# Patient Record
Sex: Male | Born: 1950 | Race: White | Hispanic: No | Marital: Married | Smoking: Former smoker
Health system: Southern US, Community
[De-identification: ages and names within clinical notes are randomized; demographics above are authoritative.]

## PROBLEM LIST (undated history)

## (undated) DIAGNOSIS — Z7289 Other problems related to lifestyle: Secondary | ICD-10-CM

## (undated) DIAGNOSIS — I739 Peripheral vascular disease, unspecified: Secondary | ICD-10-CM

## (undated) DIAGNOSIS — I2699 Other pulmonary embolism without acute cor pulmonale: Secondary | ICD-10-CM

## (undated) DIAGNOSIS — Z87891 Personal history of nicotine dependence: Secondary | ICD-10-CM

## (undated) DIAGNOSIS — E119 Type 2 diabetes mellitus without complications: Secondary | ICD-10-CM

## (undated) DIAGNOSIS — F1090 Alcohol use, unspecified, uncomplicated: Secondary | ICD-10-CM

## (undated) DIAGNOSIS — I10 Essential (primary) hypertension: Secondary | ICD-10-CM

## (undated) DIAGNOSIS — Z789 Other specified health status: Secondary | ICD-10-CM

## (undated) HISTORY — PX: VASCULAR SURGERY: SHX849

## (undated) HISTORY — PX: BACK SURGERY: SHX140

## (undated) HISTORY — PX: TONSILLECTOMY: SUR1361

## (undated) HISTORY — PX: CHOLECYSTECTOMY: SHX55

---

## 2004-08-04 ENCOUNTER — Ambulatory Visit: Payer: Self-pay | Admitting: Anesthesiology

## 2004-12-23 ENCOUNTER — Ambulatory Visit: Payer: Self-pay | Admitting: Anesthesiology

## 2005-05-19 ENCOUNTER — Ambulatory Visit: Payer: Self-pay | Admitting: Anesthesiology

## 2005-09-20 ENCOUNTER — Ambulatory Visit: Payer: Self-pay | Admitting: Anesthesiology

## 2005-12-13 ENCOUNTER — Ambulatory Visit: Payer: Self-pay | Admitting: Anesthesiology

## 2006-03-15 ENCOUNTER — Ambulatory Visit: Payer: Self-pay | Admitting: Anesthesiology

## 2006-06-06 ENCOUNTER — Ambulatory Visit: Payer: Self-pay | Admitting: Anesthesiology

## 2006-11-09 ENCOUNTER — Ambulatory Visit: Payer: Self-pay | Admitting: Anesthesiology

## 2006-12-27 ENCOUNTER — Ambulatory Visit: Payer: Self-pay | Admitting: Anesthesiology

## 2007-03-15 ENCOUNTER — Ambulatory Visit: Payer: Self-pay | Admitting: Anesthesiology

## 2007-12-05 ENCOUNTER — Ambulatory Visit: Payer: Self-pay | Admitting: Anesthesiology

## 2008-06-13 ENCOUNTER — Ambulatory Visit: Payer: Self-pay | Admitting: Anesthesiology

## 2010-10-17 DIAGNOSIS — I2699 Other pulmonary embolism without acute cor pulmonale: Secondary | ICD-10-CM

## 2010-10-17 HISTORY — DX: Other pulmonary embolism without acute cor pulmonale: I26.99

## 2012-12-13 ENCOUNTER — Ambulatory Visit: Payer: Self-pay | Admitting: Anesthesiology

## 2013-01-02 ENCOUNTER — Ambulatory Visit: Payer: Self-pay | Admitting: Anesthesiology

## 2013-02-15 DIAGNOSIS — I10 Essential (primary) hypertension: Secondary | ICD-10-CM | POA: Insufficient documentation

## 2013-02-15 DIAGNOSIS — K219 Gastro-esophageal reflux disease without esophagitis: Secondary | ICD-10-CM | POA: Insufficient documentation

## 2013-07-11 DIAGNOSIS — K589 Irritable bowel syndrome without diarrhea: Secondary | ICD-10-CM | POA: Insufficient documentation

## 2013-10-17 HISTORY — PX: ERCP W/ SPHICTEROTOMY: SHX1523

## 2013-11-08 ENCOUNTER — Emergency Department: Payer: Self-pay | Admitting: Emergency Medicine

## 2013-11-08 LAB — BASIC METABOLIC PANEL
ANION GAP: 7 (ref 7–16)
BUN: 16 mg/dL (ref 7–18)
CO2: 27 mmol/L (ref 21–32)
CREATININE: 1.07 mg/dL (ref 0.60–1.30)
Calcium, Total: 9.3 mg/dL (ref 8.5–10.1)
Chloride: 102 mmol/L (ref 98–107)
EGFR (Non-African Amer.): >60
Glucose: 150 mg/dL — ABNORMAL HIGH (ref 65–99)
Osmolality: 276 (ref 275–301)
Potassium: 3.4 mmol/L — ABNORMAL LOW (ref 3.5–5.1)
Sodium: 136 mmol/L (ref 136–145)

## 2013-11-08 LAB — CBC
HCT: 46.4 % (ref 40.0–52.0)
HGB: 16.1 g/dL (ref 13.0–18.0)
MCH: 36.1 pg — AB (ref 26.0–34.0)
MCHC: 34.8 g/dL (ref 32.0–36.0)
MCV: 104 fL — ABNORMAL HIGH (ref 80–100)
Platelet: 173 10*3/uL (ref 150–440)
RBC: 4.47 10*6/uL (ref 4.40–5.90)
RDW: 12.3 % (ref 11.5–14.5)
WBC: 10.2 10*3/uL (ref 3.8–10.6)

## 2014-07-26 DIAGNOSIS — Z9049 Acquired absence of other specified parts of digestive tract: Secondary | ICD-10-CM | POA: Insufficient documentation

## 2014-10-29 DIAGNOSIS — M65331 Trigger finger, right middle finger: Secondary | ICD-10-CM | POA: Insufficient documentation

## 2015-02-06 NOTE — H&P (Signed)
PATIENT NAME:  Damon English, Damon English MR#:  [id number] DATE OF BIRTH:  [birth year]  DATE OF ADMISSION:  12/13/2012  CHIEF COMPLAINT: Low back pain, left posterior lateral leg pain and chronic numbness affecting the left foot.   PROCEDURE:  1.  L4-L5 epidural steroid under fluoroscopic guidance moderate sedation.  2.  Trigger point at L5 x 2 approximately 5 cm to the left of midline.   HISTORY OF PRESENT ILLNESS:  The patient is a pleasant 64 year old white male with a long-standing history of low back pain. He was last seen here in the pain control center back in 2008 and presents 6 years later following a series of epidural steroids. He states that he did quite well with the epidural steroids in the past and has had a recurrence of the same quality pain that he had at that time. He has chronic lower extremity numbness that has been present for many years and has recently been involved moving some furniture. He was trying to move a bed across the floor with minimal assistance and felt a jerking sensation spasming in the low back with some recurrence of his low back pain. The pain he is describing is consistent with what he previously experienced without change in bowel or bladder function at this time. Otherwise, he is in his usual state of health at this time.   REVIEW OF SYSTEMS:  Have been noted per nursing assessment sheet.   MEDICATIONS:  Have been noted per nursing assessment sheet.  PHYSICAL EXAMINATION:  HEART:  Physical reveals heart to be regular rate and rhythm.  EYES:  Pupils are equally round and reactive to light. Extraocular muscles are intact.  LUNGS: Clear to auscultation.  BACK:  Inspection of the low back reveals 2 trigger points approximally overlying the posterior/superior iliac crest approximately 5 cm to the left of midline. He also has a positive straight leg raise on the left side. This is about 30 degrees.  His motor strength appears to be well preserved as his muscle tone and  bulk.   ASSESSMENT: 1.  Degenerative disk disease with spinal stenosis and left side radicular pain in the L5 distribution.  2.  Lumbar facet arthropathy.  3.  Myofascial low back pain.   PLAN: 1.  Will proceed with a repeat epidural series today. Have gone over the risks, benefits of the procedure with him in full detail. All questions are answered. No guarantees are made.  2.  We will proceed with 2 trigger point injections to the trigger points noted on examination today.  3.  Continue with exercises for back stretching and strengthening with return to clinic in approximately 3 weeks.   PROCEDURE: The patient was taken to the fluoroscopy suite and placed in the prone position with an IV in place and 4 mg of Versed were titrated for moderate sedation. His back was prepped with Betadine x 3. We identified the area overlying the L5-S1 interspace. 1% lidocaine was infiltrated subcutaneous into the fascia at this level. I then advanced an 18-gauge Tuohy needle with an intralaminar approach to a depth of approximately 7 cm with assistance of both AP and lateral FluoroScan. I was unable to achieve loss-of-resistance at this level. I then went to an L4-L5 site, then to a left paramedian and once again was unable to achieve loss-of-resistance  secondary to osseous intervention at all these levels. Ultimately at what appeared to be the L5-S1 level with a left paramedian approach I was able to achieve loss-of-resistance to  nearly 9 cm.  There was negative aspiration for heme or CSF, no paresthesia.  I then injected 6 mL of normal saline mixed with 1 mL ropivacaine 0.2% and 40 mg of triamcinolone and this was well tolerated. I then injected the 2 trigger points. With 4 mL of ropivacaine 0.2% and 5 mg of Decadron at each site with a 25-gauge needle. This was well tolerated. He was convalesced and discharged to home in stable condition for follow-up as mentioned.    ____________________________ Alvina Filbert Andree Elk,  MD jga:ct D: 12/14/2012 07:15:00 ET T: 12/14/2012 07:33:20 ET JOB#: [id number]  cc: Alvina Filbert. Andree Elk, MD, <Dictator> Alvina Filbert Hydie Langan MD ELECTRONICALLY SIGNED 12/18/2012 9:41

## 2015-06-12 DIAGNOSIS — J381 Polyp of vocal cord and larynx: Secondary | ICD-10-CM | POA: Insufficient documentation

## 2016-01-26 DIAGNOSIS — M545 Low back pain: Secondary | ICD-10-CM | POA: Diagnosis not present

## 2016-01-26 DIAGNOSIS — L219 Seborrheic dermatitis, unspecified: Secondary | ICD-10-CM | POA: Diagnosis not present

## 2016-01-26 DIAGNOSIS — E1165 Type 2 diabetes mellitus with hyperglycemia: Secondary | ICD-10-CM | POA: Diagnosis not present

## 2016-01-26 DIAGNOSIS — Z1389 Encounter for screening for other disorder: Secondary | ICD-10-CM | POA: Diagnosis not present

## 2016-03-18 DIAGNOSIS — M1812 Unilateral primary osteoarthritis of first carpometacarpal joint, left hand: Secondary | ICD-10-CM | POA: Diagnosis not present

## 2016-03-18 DIAGNOSIS — M1811 Unilateral primary osteoarthritis of first carpometacarpal joint, right hand: Secondary | ICD-10-CM | POA: Diagnosis not present

## 2016-03-29 DIAGNOSIS — Z6834 Body mass index (BMI) 34.0-34.9, adult: Secondary | ICD-10-CM | POA: Diagnosis not present

## 2016-03-29 DIAGNOSIS — J069 Acute upper respiratory infection, unspecified: Secondary | ICD-10-CM | POA: Diagnosis not present

## 2016-03-29 DIAGNOSIS — M545 Low back pain: Secondary | ICD-10-CM | POA: Diagnosis not present

## 2016-05-25 DIAGNOSIS — E119 Type 2 diabetes mellitus without complications: Secondary | ICD-10-CM | POA: Diagnosis not present

## 2016-05-25 DIAGNOSIS — M545 Low back pain: Secondary | ICD-10-CM | POA: Diagnosis not present

## 2016-05-25 DIAGNOSIS — Z6834 Body mass index (BMI) 34.0-34.9, adult: Secondary | ICD-10-CM | POA: Diagnosis not present

## 2016-05-25 DIAGNOSIS — I10 Essential (primary) hypertension: Secondary | ICD-10-CM | POA: Diagnosis not present

## 2016-10-19 DIAGNOSIS — R6889 Other general symptoms and signs: Secondary | ICD-10-CM | POA: Diagnosis not present

## 2016-10-19 DIAGNOSIS — Z6834 Body mass index (BMI) 34.0-34.9, adult: Secondary | ICD-10-CM | POA: Diagnosis not present

## 2016-11-01 DIAGNOSIS — Z23 Encounter for immunization: Secondary | ICD-10-CM | POA: Diagnosis not present

## 2016-12-01 DIAGNOSIS — E119 Type 2 diabetes mellitus without complications: Secondary | ICD-10-CM | POA: Diagnosis not present

## 2016-12-01 DIAGNOSIS — M545 Low back pain: Secondary | ICD-10-CM | POA: Diagnosis not present

## 2016-12-01 DIAGNOSIS — I10 Essential (primary) hypertension: Secondary | ICD-10-CM | POA: Diagnosis not present

## 2016-12-01 DIAGNOSIS — Z0000 Encounter for general adult medical examination without abnormal findings: Secondary | ICD-10-CM | POA: Diagnosis not present

## 2017-02-08 DIAGNOSIS — L308 Other specified dermatitis: Secondary | ICD-10-CM | POA: Diagnosis not present

## 2017-03-15 DIAGNOSIS — D2239 Melanocytic nevi of other parts of face: Secondary | ICD-10-CM | POA: Diagnosis not present

## 2017-03-15 DIAGNOSIS — D225 Melanocytic nevi of trunk: Secondary | ICD-10-CM | POA: Diagnosis not present

## 2017-03-15 DIAGNOSIS — L218 Other seborrheic dermatitis: Secondary | ICD-10-CM | POA: Diagnosis not present

## 2017-03-15 DIAGNOSIS — L308 Other specified dermatitis: Secondary | ICD-10-CM | POA: Diagnosis not present

## 2017-03-29 DIAGNOSIS — L308 Other specified dermatitis: Secondary | ICD-10-CM | POA: Diagnosis not present

## 2017-03-29 DIAGNOSIS — L218 Other seborrheic dermatitis: Secondary | ICD-10-CM | POA: Diagnosis not present

## 2017-03-29 DIAGNOSIS — L249 Irritant contact dermatitis, unspecified cause: Secondary | ICD-10-CM | POA: Diagnosis not present

## 2017-03-29 DIAGNOSIS — L570 Actinic keratosis: Secondary | ICD-10-CM | POA: Diagnosis not present

## 2017-11-08 DIAGNOSIS — Z23 Encounter for immunization: Secondary | ICD-10-CM | POA: Diagnosis not present

## 2017-11-08 DIAGNOSIS — K219 Gastro-esophageal reflux disease without esophagitis: Secondary | ICD-10-CM | POA: Diagnosis not present

## 2017-11-08 DIAGNOSIS — E119 Type 2 diabetes mellitus without complications: Secondary | ICD-10-CM | POA: Diagnosis not present

## 2017-11-08 DIAGNOSIS — I10 Essential (primary) hypertension: Secondary | ICD-10-CM | POA: Diagnosis not present

## 2017-11-08 DIAGNOSIS — Z1331 Encounter for screening for depression: Secondary | ICD-10-CM | POA: Diagnosis not present

## 2017-11-08 DIAGNOSIS — E782 Mixed hyperlipidemia: Secondary | ICD-10-CM | POA: Diagnosis not present

## 2018-01-10 DIAGNOSIS — M48061 Spinal stenosis, lumbar region without neurogenic claudication: Secondary | ICD-10-CM | POA: Diagnosis not present

## 2018-01-10 DIAGNOSIS — M545 Low back pain: Secondary | ICD-10-CM | POA: Diagnosis not present

## 2018-01-17 DIAGNOSIS — M5136 Other intervertebral disc degeneration, lumbar region: Secondary | ICD-10-CM | POA: Diagnosis not present

## 2018-01-24 DIAGNOSIS — M48061 Spinal stenosis, lumbar region without neurogenic claudication: Secondary | ICD-10-CM | POA: Diagnosis not present

## 2018-02-07 DIAGNOSIS — M545 Low back pain: Secondary | ICD-10-CM | POA: Diagnosis not present

## 2018-02-07 DIAGNOSIS — M48061 Spinal stenosis, lumbar region without neurogenic claudication: Secondary | ICD-10-CM | POA: Diagnosis not present

## 2018-02-07 DIAGNOSIS — G8929 Other chronic pain: Secondary | ICD-10-CM | POA: Diagnosis not present

## 2018-03-21 DIAGNOSIS — J069 Acute upper respiratory infection, unspecified: Secondary | ICD-10-CM | POA: Diagnosis not present

## 2018-03-21 DIAGNOSIS — Z6833 Body mass index (BMI) 33.0-33.9, adult: Secondary | ICD-10-CM | POA: Diagnosis not present

## 2018-03-21 DIAGNOSIS — E119 Type 2 diabetes mellitus without complications: Secondary | ICD-10-CM | POA: Diagnosis not present

## 2018-05-05 DIAGNOSIS — M79604 Pain in right leg: Secondary | ICD-10-CM | POA: Diagnosis not present

## 2018-05-05 DIAGNOSIS — M6281 Muscle weakness (generalized): Secondary | ICD-10-CM | POA: Diagnosis not present

## 2018-05-05 DIAGNOSIS — R200 Anesthesia of skin: Secondary | ICD-10-CM | POA: Diagnosis not present

## 2018-05-05 DIAGNOSIS — I8393 Asymptomatic varicose veins of bilateral lower extremities: Secondary | ICD-10-CM | POA: Diagnosis not present

## 2018-05-05 DIAGNOSIS — M79661 Pain in right lower leg: Secondary | ICD-10-CM | POA: Diagnosis not present

## 2018-05-05 DIAGNOSIS — Z87891 Personal history of nicotine dependence: Secondary | ICD-10-CM | POA: Diagnosis not present

## 2018-05-07 DIAGNOSIS — E882 Lipomatosis, not elsewhere classified: Secondary | ICD-10-CM | POA: Diagnosis not present

## 2018-05-07 DIAGNOSIS — M47816 Spondylosis without myelopathy or radiculopathy, lumbar region: Secondary | ICD-10-CM | POA: Diagnosis not present

## 2018-05-07 DIAGNOSIS — M48061 Spinal stenosis, lumbar region without neurogenic claudication: Secondary | ICD-10-CM | POA: Diagnosis not present

## 2018-05-07 DIAGNOSIS — Z01818 Encounter for other preprocedural examination: Secondary | ICD-10-CM | POA: Diagnosis not present

## 2018-05-11 DIAGNOSIS — I10 Essential (primary) hypertension: Secondary | ICD-10-CM | POA: Diagnosis not present

## 2018-05-11 DIAGNOSIS — M1990 Unspecified osteoarthritis, unspecified site: Secondary | ICD-10-CM | POA: Diagnosis not present

## 2018-05-11 DIAGNOSIS — M48061 Spinal stenosis, lumbar region without neurogenic claudication: Secondary | ICD-10-CM | POA: Diagnosis not present

## 2018-05-11 DIAGNOSIS — M47896 Other spondylosis, lumbar region: Secondary | ICD-10-CM | POA: Diagnosis not present

## 2018-05-11 DIAGNOSIS — Q762 Congenital spondylolisthesis: Secondary | ICD-10-CM | POA: Diagnosis not present

## 2018-05-11 DIAGNOSIS — Z86711 Personal history of pulmonary embolism: Secondary | ICD-10-CM | POA: Diagnosis not present

## 2018-05-11 DIAGNOSIS — E119 Type 2 diabetes mellitus without complications: Secondary | ICD-10-CM | POA: Diagnosis not present

## 2018-05-11 DIAGNOSIS — Z87891 Personal history of nicotine dependence: Secondary | ICD-10-CM | POA: Diagnosis not present

## 2018-05-11 DIAGNOSIS — K219 Gastro-esophageal reflux disease without esophagitis: Secondary | ICD-10-CM | POA: Diagnosis not present

## 2018-05-11 DIAGNOSIS — I739 Peripheral vascular disease, unspecified: Secondary | ICD-10-CM | POA: Diagnosis not present

## 2018-05-11 DIAGNOSIS — Z7982 Long term (current) use of aspirin: Secondary | ICD-10-CM | POA: Diagnosis not present

## 2018-05-11 DIAGNOSIS — Z7984 Long term (current) use of oral hypoglycemic drugs: Secondary | ICD-10-CM | POA: Diagnosis not present

## 2018-05-11 DIAGNOSIS — I8393 Asymptomatic varicose veins of bilateral lower extremities: Secondary | ICD-10-CM | POA: Diagnosis not present

## 2018-05-11 DIAGNOSIS — H409 Unspecified glaucoma: Secondary | ICD-10-CM | POA: Diagnosis not present

## 2018-05-11 DIAGNOSIS — M4316 Spondylolisthesis, lumbar region: Secondary | ICD-10-CM | POA: Diagnosis not present

## 2018-05-11 DIAGNOSIS — M79604 Pain in right leg: Secondary | ICD-10-CM | POA: Diagnosis not present

## 2018-05-11 DIAGNOSIS — Z981 Arthrodesis status: Secondary | ICD-10-CM | POA: Diagnosis not present

## 2018-05-12 DIAGNOSIS — M4316 Spondylolisthesis, lumbar region: Secondary | ICD-10-CM | POA: Diagnosis not present

## 2018-05-12 DIAGNOSIS — M48061 Spinal stenosis, lumbar region without neurogenic claudication: Secondary | ICD-10-CM | POA: Diagnosis not present

## 2018-05-12 DIAGNOSIS — Z981 Arthrodesis status: Secondary | ICD-10-CM | POA: Diagnosis not present

## 2018-05-13 DIAGNOSIS — M48061 Spinal stenosis, lumbar region without neurogenic claudication: Secondary | ICD-10-CM | POA: Diagnosis not present

## 2018-05-13 DIAGNOSIS — Z981 Arthrodesis status: Secondary | ICD-10-CM | POA: Diagnosis not present

## 2018-05-14 DIAGNOSIS — M79604 Pain in right leg: Secondary | ICD-10-CM | POA: Diagnosis not present

## 2018-05-16 DIAGNOSIS — M48061 Spinal stenosis, lumbar region without neurogenic claudication: Secondary | ICD-10-CM | POA: Diagnosis not present

## 2018-05-29 DIAGNOSIS — K219 Gastro-esophageal reflux disease without esophagitis: Secondary | ICD-10-CM | POA: Diagnosis not present

## 2018-05-29 DIAGNOSIS — I10 Essential (primary) hypertension: Secondary | ICD-10-CM | POA: Diagnosis not present

## 2018-05-29 DIAGNOSIS — E1140 Type 2 diabetes mellitus with diabetic neuropathy, unspecified: Secondary | ICD-10-CM | POA: Diagnosis not present

## 2018-05-29 DIAGNOSIS — Z86718 Personal history of other venous thrombosis and embolism: Secondary | ICD-10-CM | POA: Diagnosis not present

## 2018-05-29 DIAGNOSIS — I82431 Acute embolism and thrombosis of right popliteal vein: Secondary | ICD-10-CM | POA: Diagnosis not present

## 2018-05-29 DIAGNOSIS — I2699 Other pulmonary embolism without acute cor pulmonale: Secondary | ICD-10-CM | POA: Diagnosis not present

## 2018-05-29 DIAGNOSIS — R0602 Shortness of breath: Secondary | ICD-10-CM | POA: Diagnosis not present

## 2018-05-29 DIAGNOSIS — I2609 Other pulmonary embolism with acute cor pulmonale: Secondary | ICD-10-CM | POA: Diagnosis not present

## 2018-05-29 DIAGNOSIS — H409 Unspecified glaucoma: Secondary | ICD-10-CM | POA: Diagnosis not present

## 2018-05-29 DIAGNOSIS — Z87891 Personal history of nicotine dependence: Secondary | ICD-10-CM | POA: Diagnosis not present

## 2018-05-29 DIAGNOSIS — Z981 Arthrodesis status: Secondary | ICD-10-CM | POA: Diagnosis not present

## 2018-05-29 DIAGNOSIS — I82411 Acute embolism and thrombosis of right femoral vein: Secondary | ICD-10-CM | POA: Diagnosis not present

## 2018-05-29 DIAGNOSIS — Z86711 Personal history of pulmonary embolism: Secondary | ICD-10-CM | POA: Diagnosis not present

## 2018-05-30 DIAGNOSIS — R0602 Shortness of breath: Secondary | ICD-10-CM | POA: Diagnosis not present

## 2018-05-30 DIAGNOSIS — I10 Essential (primary) hypertension: Secondary | ICD-10-CM | POA: Diagnosis not present

## 2018-05-30 DIAGNOSIS — E119 Type 2 diabetes mellitus without complications: Secondary | ICD-10-CM | POA: Insufficient documentation

## 2018-05-31 DIAGNOSIS — R0602 Shortness of breath: Secondary | ICD-10-CM | POA: Diagnosis not present

## 2018-06-06 DIAGNOSIS — E119 Type 2 diabetes mellitus without complications: Secondary | ICD-10-CM | POA: Diagnosis not present

## 2018-06-11 DIAGNOSIS — R000 Tachycardia, unspecified: Secondary | ICD-10-CM | POA: Diagnosis not present

## 2018-06-11 DIAGNOSIS — E119 Type 2 diabetes mellitus without complications: Secondary | ICD-10-CM | POA: Diagnosis not present

## 2018-06-11 DIAGNOSIS — I739 Peripheral vascular disease, unspecified: Secondary | ICD-10-CM | POA: Diagnosis not present

## 2018-06-11 DIAGNOSIS — I82401 Acute embolism and thrombosis of unspecified deep veins of right lower extremity: Secondary | ICD-10-CM | POA: Diagnosis not present

## 2018-06-11 DIAGNOSIS — Z79899 Other long term (current) drug therapy: Secondary | ICD-10-CM | POA: Diagnosis not present

## 2018-06-11 DIAGNOSIS — I2699 Other pulmonary embolism without acute cor pulmonale: Secondary | ICD-10-CM | POA: Diagnosis not present

## 2018-06-12 DIAGNOSIS — Z5181 Encounter for therapeutic drug level monitoring: Secondary | ICD-10-CM | POA: Diagnosis not present

## 2018-06-12 DIAGNOSIS — I998 Other disorder of circulatory system: Secondary | ICD-10-CM | POA: Diagnosis not present

## 2018-06-12 DIAGNOSIS — R000 Tachycardia, unspecified: Secondary | ICD-10-CM | POA: Diagnosis not present

## 2018-06-12 DIAGNOSIS — Z7901 Long term (current) use of anticoagulants: Secondary | ICD-10-CM | POA: Diagnosis not present

## 2018-06-12 DIAGNOSIS — G8929 Other chronic pain: Secondary | ICD-10-CM | POA: Diagnosis not present

## 2018-06-12 DIAGNOSIS — R269 Unspecified abnormalities of gait and mobility: Secondary | ICD-10-CM | POA: Diagnosis not present

## 2018-06-12 DIAGNOSIS — R9431 Abnormal electrocardiogram [ECG] [EKG]: Secondary | ICD-10-CM | POA: Diagnosis not present

## 2018-06-12 DIAGNOSIS — I739 Peripheral vascular disease, unspecified: Secondary | ICD-10-CM | POA: Diagnosis not present

## 2018-06-12 DIAGNOSIS — Z79899 Other long term (current) drug therapy: Secondary | ICD-10-CM | POA: Diagnosis not present

## 2018-06-12 DIAGNOSIS — M79604 Pain in right leg: Secondary | ICD-10-CM | POA: Diagnosis not present

## 2018-06-12 DIAGNOSIS — Z01818 Encounter for other preprocedural examination: Secondary | ICD-10-CM | POA: Diagnosis not present

## 2018-06-12 DIAGNOSIS — I771 Stricture of artery: Secondary | ICD-10-CM | POA: Diagnosis not present

## 2018-06-12 DIAGNOSIS — Z87891 Personal history of nicotine dependence: Secondary | ICD-10-CM | POA: Diagnosis not present

## 2018-06-13 DIAGNOSIS — I7092 Chronic total occlusion of artery of the extremities: Secondary | ICD-10-CM | POA: Diagnosis not present

## 2018-06-13 DIAGNOSIS — I10 Essential (primary) hypertension: Secondary | ICD-10-CM | POA: Diagnosis not present

## 2018-06-13 DIAGNOSIS — T50905A Adverse effect of unspecified drugs, medicaments and biological substances, initial encounter: Secondary | ICD-10-CM | POA: Diagnosis not present

## 2018-06-13 DIAGNOSIS — I998 Other disorder of circulatory system: Secondary | ICD-10-CM | POA: Insufficient documentation

## 2018-06-13 DIAGNOSIS — E1151 Type 2 diabetes mellitus with diabetic peripheral angiopathy without gangrene: Secondary | ICD-10-CM | POA: Diagnosis not present

## 2018-06-13 DIAGNOSIS — M79606 Pain in leg, unspecified: Secondary | ICD-10-CM | POA: Insufficient documentation

## 2018-06-13 DIAGNOSIS — I70221 Atherosclerosis of native arteries of extremities with rest pain, right leg: Secondary | ICD-10-CM | POA: Diagnosis not present

## 2018-06-13 DIAGNOSIS — E1139 Type 2 diabetes mellitus with other diabetic ophthalmic complication: Secondary | ICD-10-CM | POA: Diagnosis not present

## 2018-06-13 DIAGNOSIS — K589 Irritable bowel syndrome without diarrhea: Secondary | ICD-10-CM | POA: Diagnosis not present

## 2018-06-13 DIAGNOSIS — I871 Compression of vein: Secondary | ICD-10-CM | POA: Diagnosis not present

## 2018-06-13 DIAGNOSIS — Z7901 Long term (current) use of anticoagulants: Secondary | ICD-10-CM | POA: Diagnosis not present

## 2018-06-13 DIAGNOSIS — H42 Glaucoma in diseases classified elsewhere: Secondary | ICD-10-CM | POA: Diagnosis not present

## 2018-06-13 DIAGNOSIS — K219 Gastro-esophageal reflux disease without esophagitis: Secondary | ICD-10-CM | POA: Diagnosis not present

## 2018-06-13 DIAGNOSIS — I82491 Acute embolism and thrombosis of other specified deep vein of right lower extremity: Secondary | ICD-10-CM | POA: Diagnosis not present

## 2018-06-13 DIAGNOSIS — K5903 Drug induced constipation: Secondary | ICD-10-CM | POA: Diagnosis not present

## 2018-06-13 DIAGNOSIS — I82411 Acute embolism and thrombosis of right femoral vein: Secondary | ICD-10-CM | POA: Diagnosis not present

## 2018-06-13 DIAGNOSIS — I82431 Acute embolism and thrombosis of right popliteal vein: Secondary | ICD-10-CM | POA: Diagnosis not present

## 2018-06-13 DIAGNOSIS — I739 Peripheral vascular disease, unspecified: Secondary | ICD-10-CM | POA: Diagnosis not present

## 2018-06-15 DIAGNOSIS — I82411 Acute embolism and thrombosis of right femoral vein: Secondary | ICD-10-CM | POA: Diagnosis not present

## 2018-06-15 DIAGNOSIS — I82431 Acute embolism and thrombosis of right popliteal vein: Secondary | ICD-10-CM | POA: Diagnosis not present

## 2018-06-15 DIAGNOSIS — I70221 Atherosclerosis of native arteries of extremities with rest pain, right leg: Secondary | ICD-10-CM | POA: Diagnosis not present

## 2018-06-19 DIAGNOSIS — I70221 Atherosclerosis of native arteries of extremities with rest pain, right leg: Secondary | ICD-10-CM | POA: Diagnosis not present

## 2018-06-19 DIAGNOSIS — I7092 Chronic total occlusion of artery of the extremities: Secondary | ICD-10-CM | POA: Diagnosis not present

## 2018-06-19 DIAGNOSIS — I739 Peripheral vascular disease, unspecified: Secondary | ICD-10-CM | POA: Diagnosis not present

## 2018-06-19 HISTORY — PX: THROMBOENDARTERECTOMY: SHX46

## 2018-06-19 HISTORY — PX: FEMORAL ARTERY - POPLITEAL ARTERY BYPASS GRAFT: SUR180

## 2018-06-21 DIAGNOSIS — K5903 Drug induced constipation: Secondary | ICD-10-CM | POA: Insufficient documentation

## 2018-06-21 DIAGNOSIS — Z7901 Long term (current) use of anticoagulants: Secondary | ICD-10-CM | POA: Insufficient documentation

## 2018-06-22 DIAGNOSIS — I739 Peripheral vascular disease, unspecified: Secondary | ICD-10-CM | POA: Diagnosis not present

## 2018-06-26 DIAGNOSIS — I2699 Other pulmonary embolism without acute cor pulmonale: Secondary | ICD-10-CM | POA: Diagnosis not present

## 2018-06-26 DIAGNOSIS — E1151 Type 2 diabetes mellitus with diabetic peripheral angiopathy without gangrene: Secondary | ICD-10-CM | POA: Diagnosis not present

## 2018-06-26 DIAGNOSIS — Z48812 Encounter for surgical aftercare following surgery on the circulatory system: Secondary | ICD-10-CM | POA: Diagnosis not present

## 2018-06-26 DIAGNOSIS — I82411 Acute embolism and thrombosis of right femoral vein: Secondary | ICD-10-CM | POA: Diagnosis not present

## 2018-06-26 DIAGNOSIS — I7092 Chronic total occlusion of artery of the extremities: Secondary | ICD-10-CM | POA: Diagnosis not present

## 2018-06-27 DIAGNOSIS — I739 Peripheral vascular disease, unspecified: Secondary | ICD-10-CM | POA: Diagnosis not present

## 2018-06-27 DIAGNOSIS — I82411 Acute embolism and thrombosis of right femoral vein: Secondary | ICD-10-CM | POA: Diagnosis not present

## 2018-06-28 DIAGNOSIS — I82411 Acute embolism and thrombosis of right femoral vein: Secondary | ICD-10-CM | POA: Diagnosis not present

## 2018-06-28 DIAGNOSIS — E1151 Type 2 diabetes mellitus with diabetic peripheral angiopathy without gangrene: Secondary | ICD-10-CM | POA: Diagnosis not present

## 2018-06-28 DIAGNOSIS — I2699 Other pulmonary embolism without acute cor pulmonale: Secondary | ICD-10-CM | POA: Diagnosis not present

## 2018-06-28 DIAGNOSIS — I7092 Chronic total occlusion of artery of the extremities: Secondary | ICD-10-CM | POA: Diagnosis not present

## 2018-06-28 DIAGNOSIS — Z48812 Encounter for surgical aftercare following surgery on the circulatory system: Secondary | ICD-10-CM | POA: Diagnosis not present

## 2018-06-29 DIAGNOSIS — I7092 Chronic total occlusion of artery of the extremities: Secondary | ICD-10-CM | POA: Diagnosis not present

## 2018-06-29 DIAGNOSIS — I82411 Acute embolism and thrombosis of right femoral vein: Secondary | ICD-10-CM | POA: Diagnosis not present

## 2018-06-29 DIAGNOSIS — Z48812 Encounter for surgical aftercare following surgery on the circulatory system: Secondary | ICD-10-CM | POA: Diagnosis not present

## 2018-06-29 DIAGNOSIS — I2699 Other pulmonary embolism without acute cor pulmonale: Secondary | ICD-10-CM | POA: Diagnosis not present

## 2018-06-29 DIAGNOSIS — E1151 Type 2 diabetes mellitus with diabetic peripheral angiopathy without gangrene: Secondary | ICD-10-CM | POA: Diagnosis not present

## 2018-07-02 DIAGNOSIS — I2699 Other pulmonary embolism without acute cor pulmonale: Secondary | ICD-10-CM | POA: Diagnosis not present

## 2018-07-02 DIAGNOSIS — I7092 Chronic total occlusion of artery of the extremities: Secondary | ICD-10-CM | POA: Diagnosis not present

## 2018-07-02 DIAGNOSIS — I82411 Acute embolism and thrombosis of right femoral vein: Secondary | ICD-10-CM | POA: Diagnosis not present

## 2018-07-02 DIAGNOSIS — E1151 Type 2 diabetes mellitus with diabetic peripheral angiopathy without gangrene: Secondary | ICD-10-CM | POA: Diagnosis not present

## 2018-07-02 DIAGNOSIS — Z48812 Encounter for surgical aftercare following surgery on the circulatory system: Secondary | ICD-10-CM | POA: Diagnosis not present

## 2018-07-04 DIAGNOSIS — E1151 Type 2 diabetes mellitus with diabetic peripheral angiopathy without gangrene: Secondary | ICD-10-CM | POA: Diagnosis not present

## 2018-07-04 DIAGNOSIS — Z48812 Encounter for surgical aftercare following surgery on the circulatory system: Secondary | ICD-10-CM | POA: Diagnosis not present

## 2018-07-04 DIAGNOSIS — I82411 Acute embolism and thrombosis of right femoral vein: Secondary | ICD-10-CM | POA: Diagnosis not present

## 2018-07-04 DIAGNOSIS — I7092 Chronic total occlusion of artery of the extremities: Secondary | ICD-10-CM | POA: Diagnosis not present

## 2018-07-04 DIAGNOSIS — M48061 Spinal stenosis, lumbar region without neurogenic claudication: Secondary | ICD-10-CM | POA: Diagnosis not present

## 2018-07-04 DIAGNOSIS — I2699 Other pulmonary embolism without acute cor pulmonale: Secondary | ICD-10-CM | POA: Diagnosis not present

## 2018-07-04 DIAGNOSIS — I739 Peripheral vascular disease, unspecified: Secondary | ICD-10-CM | POA: Diagnosis not present

## 2018-07-06 DIAGNOSIS — I82411 Acute embolism and thrombosis of right femoral vein: Secondary | ICD-10-CM | POA: Diagnosis not present

## 2018-07-06 DIAGNOSIS — E1151 Type 2 diabetes mellitus with diabetic peripheral angiopathy without gangrene: Secondary | ICD-10-CM | POA: Diagnosis not present

## 2018-07-06 DIAGNOSIS — I7092 Chronic total occlusion of artery of the extremities: Secondary | ICD-10-CM | POA: Diagnosis not present

## 2018-07-06 DIAGNOSIS — Z48812 Encounter for surgical aftercare following surgery on the circulatory system: Secondary | ICD-10-CM | POA: Diagnosis not present

## 2018-07-06 DIAGNOSIS — I2699 Other pulmonary embolism without acute cor pulmonale: Secondary | ICD-10-CM | POA: Diagnosis not present

## 2018-07-10 DIAGNOSIS — E1151 Type 2 diabetes mellitus with diabetic peripheral angiopathy without gangrene: Secondary | ICD-10-CM | POA: Diagnosis not present

## 2018-07-10 DIAGNOSIS — I82411 Acute embolism and thrombosis of right femoral vein: Secondary | ICD-10-CM | POA: Diagnosis not present

## 2018-07-10 DIAGNOSIS — Z48812 Encounter for surgical aftercare following surgery on the circulatory system: Secondary | ICD-10-CM | POA: Diagnosis not present

## 2018-07-10 DIAGNOSIS — I7092 Chronic total occlusion of artery of the extremities: Secondary | ICD-10-CM | POA: Diagnosis not present

## 2018-07-10 DIAGNOSIS — I2699 Other pulmonary embolism without acute cor pulmonale: Secondary | ICD-10-CM | POA: Diagnosis not present

## 2018-07-11 DIAGNOSIS — I739 Peripheral vascular disease, unspecified: Secondary | ICD-10-CM | POA: Diagnosis not present

## 2018-07-12 DIAGNOSIS — Z48812 Encounter for surgical aftercare following surgery on the circulatory system: Secondary | ICD-10-CM | POA: Diagnosis not present

## 2018-07-12 DIAGNOSIS — I82411 Acute embolism and thrombosis of right femoral vein: Secondary | ICD-10-CM | POA: Diagnosis not present

## 2018-07-12 DIAGNOSIS — E1151 Type 2 diabetes mellitus with diabetic peripheral angiopathy without gangrene: Secondary | ICD-10-CM | POA: Diagnosis not present

## 2018-07-12 DIAGNOSIS — I2699 Other pulmonary embolism without acute cor pulmonale: Secondary | ICD-10-CM | POA: Diagnosis not present

## 2018-07-12 DIAGNOSIS — I7092 Chronic total occlusion of artery of the extremities: Secondary | ICD-10-CM | POA: Diagnosis not present

## 2018-07-18 DIAGNOSIS — I739 Peripheral vascular disease, unspecified: Secondary | ICD-10-CM | POA: Diagnosis not present

## 2018-07-18 DIAGNOSIS — B951 Streptococcus, group B, as the cause of diseases classified elsewhere: Secondary | ICD-10-CM | POA: Diagnosis not present

## 2018-07-18 DIAGNOSIS — Z95828 Presence of other vascular implants and grafts: Secondary | ICD-10-CM | POA: Diagnosis not present

## 2018-07-18 DIAGNOSIS — Z86718 Personal history of other venous thrombosis and embolism: Secondary | ICD-10-CM | POA: Diagnosis not present

## 2018-07-18 DIAGNOSIS — Z7901 Long term (current) use of anticoagulants: Secondary | ICD-10-CM | POA: Diagnosis not present

## 2018-07-18 DIAGNOSIS — I82411 Acute embolism and thrombosis of right femoral vein: Secondary | ICD-10-CM | POA: Diagnosis not present

## 2018-07-18 DIAGNOSIS — T8141XA Infection following a procedure, superficial incisional surgical site, initial encounter: Secondary | ICD-10-CM | POA: Diagnosis not present

## 2018-07-18 DIAGNOSIS — T8149XA Infection following a procedure, other surgical site, initial encounter: Secondary | ICD-10-CM | POA: Diagnosis not present

## 2018-07-18 DIAGNOSIS — R200 Anesthesia of skin: Secondary | ICD-10-CM | POA: Diagnosis not present

## 2018-07-18 DIAGNOSIS — T8189XA Other complications of procedures, not elsewhere classified, initial encounter: Secondary | ICD-10-CM | POA: Diagnosis not present

## 2018-07-18 DIAGNOSIS — E1151 Type 2 diabetes mellitus with diabetic peripheral angiopathy without gangrene: Secondary | ICD-10-CM | POA: Diagnosis not present

## 2018-07-18 DIAGNOSIS — I10 Essential (primary) hypertension: Secondary | ICD-10-CM | POA: Diagnosis not present

## 2018-07-18 DIAGNOSIS — T8142XA Infection following a procedure, deep incisional surgical site, initial encounter: Secondary | ICD-10-CM | POA: Diagnosis not present

## 2018-07-18 DIAGNOSIS — Z87891 Personal history of nicotine dependence: Secondary | ICD-10-CM | POA: Diagnosis not present

## 2018-07-18 DIAGNOSIS — Z86711 Personal history of pulmonary embolism: Secondary | ICD-10-CM | POA: Diagnosis not present

## 2018-07-18 DIAGNOSIS — T8131XA Disruption of external operation (surgical) wound, not elsewhere classified, initial encounter: Secondary | ICD-10-CM | POA: Diagnosis not present

## 2018-07-19 DIAGNOSIS — T8149XA Infection following a procedure, other surgical site, initial encounter: Secondary | ICD-10-CM | POA: Insufficient documentation

## 2018-07-20 DIAGNOSIS — T8149XA Infection following a procedure, other surgical site, initial encounter: Secondary | ICD-10-CM | POA: Diagnosis not present

## 2018-07-20 DIAGNOSIS — B951 Streptococcus, group B, as the cause of diseases classified elsewhere: Secondary | ICD-10-CM | POA: Diagnosis not present

## 2018-07-20 DIAGNOSIS — T8142XA Infection following a procedure, deep incisional surgical site, initial encounter: Secondary | ICD-10-CM | POA: Diagnosis not present

## 2018-07-23 DIAGNOSIS — T8189XA Other complications of procedures, not elsewhere classified, initial encounter: Secondary | ICD-10-CM | POA: Diagnosis not present

## 2018-07-24 DIAGNOSIS — T8189XA Other complications of procedures, not elsewhere classified, initial encounter: Secondary | ICD-10-CM | POA: Diagnosis not present

## 2018-07-25 DIAGNOSIS — T8189XA Other complications of procedures, not elsewhere classified, initial encounter: Secondary | ICD-10-CM | POA: Diagnosis not present

## 2018-07-26 DIAGNOSIS — T8189XA Other complications of procedures, not elsewhere classified, initial encounter: Secondary | ICD-10-CM | POA: Diagnosis not present

## 2018-07-27 DIAGNOSIS — Z86711 Personal history of pulmonary embolism: Secondary | ICD-10-CM | POA: Diagnosis not present

## 2018-07-27 DIAGNOSIS — Z79891 Long term (current) use of opiate analgesic: Secondary | ICD-10-CM | POA: Diagnosis not present

## 2018-07-27 DIAGNOSIS — T8149XA Infection following a procedure, other surgical site, initial encounter: Secondary | ICD-10-CM | POA: Diagnosis not present

## 2018-07-27 DIAGNOSIS — M1990 Unspecified osteoarthritis, unspecified site: Secondary | ICD-10-CM | POA: Diagnosis not present

## 2018-07-27 DIAGNOSIS — H409 Unspecified glaucoma: Secondary | ICD-10-CM | POA: Diagnosis not present

## 2018-07-27 DIAGNOSIS — Z86718 Personal history of other venous thrombosis and embolism: Secondary | ICD-10-CM | POA: Diagnosis not present

## 2018-07-27 DIAGNOSIS — Z87891 Personal history of nicotine dependence: Secondary | ICD-10-CM | POA: Diagnosis not present

## 2018-07-27 DIAGNOSIS — K219 Gastro-esophageal reflux disease without esophagitis: Secondary | ICD-10-CM | POA: Diagnosis not present

## 2018-07-27 DIAGNOSIS — T8189XA Other complications of procedures, not elsewhere classified, initial encounter: Secondary | ICD-10-CM | POA: Diagnosis not present

## 2018-07-27 DIAGNOSIS — Z7901 Long term (current) use of anticoagulants: Secondary | ICD-10-CM | POA: Diagnosis not present

## 2018-07-27 DIAGNOSIS — I10 Essential (primary) hypertension: Secondary | ICD-10-CM | POA: Diagnosis not present

## 2018-07-27 DIAGNOSIS — T8141XA Infection following a procedure, superficial incisional surgical site, initial encounter: Secondary | ICD-10-CM | POA: Diagnosis not present

## 2018-07-27 DIAGNOSIS — Z4800 Encounter for change or removal of nonsurgical wound dressing: Secondary | ICD-10-CM | POA: Diagnosis not present

## 2018-07-27 DIAGNOSIS — E1151 Type 2 diabetes mellitus with diabetic peripheral angiopathy without gangrene: Secondary | ICD-10-CM | POA: Diagnosis not present

## 2018-07-28 DIAGNOSIS — T8189XA Other complications of procedures, not elsewhere classified, initial encounter: Secondary | ICD-10-CM | POA: Diagnosis not present

## 2018-07-29 DIAGNOSIS — T8189XA Other complications of procedures, not elsewhere classified, initial encounter: Secondary | ICD-10-CM | POA: Diagnosis not present

## 2018-07-30 DIAGNOSIS — T8141XA Infection following a procedure, superficial incisional surgical site, initial encounter: Secondary | ICD-10-CM | POA: Diagnosis not present

## 2018-07-30 DIAGNOSIS — T8149XA Infection following a procedure, other surgical site, initial encounter: Secondary | ICD-10-CM | POA: Diagnosis not present

## 2018-07-30 DIAGNOSIS — Z7901 Long term (current) use of anticoagulants: Secondary | ICD-10-CM | POA: Diagnosis not present

## 2018-07-30 DIAGNOSIS — Z87891 Personal history of nicotine dependence: Secondary | ICD-10-CM | POA: Diagnosis not present

## 2018-07-30 DIAGNOSIS — H409 Unspecified glaucoma: Secondary | ICD-10-CM | POA: Diagnosis not present

## 2018-07-30 DIAGNOSIS — Z86718 Personal history of other venous thrombosis and embolism: Secondary | ICD-10-CM | POA: Diagnosis not present

## 2018-07-30 DIAGNOSIS — I10 Essential (primary) hypertension: Secondary | ICD-10-CM | POA: Diagnosis not present

## 2018-07-30 DIAGNOSIS — Z79891 Long term (current) use of opiate analgesic: Secondary | ICD-10-CM | POA: Diagnosis not present

## 2018-07-30 DIAGNOSIS — M1990 Unspecified osteoarthritis, unspecified site: Secondary | ICD-10-CM | POA: Diagnosis not present

## 2018-07-30 DIAGNOSIS — Z4800 Encounter for change or removal of nonsurgical wound dressing: Secondary | ICD-10-CM | POA: Diagnosis not present

## 2018-07-30 DIAGNOSIS — Z86711 Personal history of pulmonary embolism: Secondary | ICD-10-CM | POA: Diagnosis not present

## 2018-07-30 DIAGNOSIS — K219 Gastro-esophageal reflux disease without esophagitis: Secondary | ICD-10-CM | POA: Diagnosis not present

## 2018-07-30 DIAGNOSIS — E1151 Type 2 diabetes mellitus with diabetic peripheral angiopathy without gangrene: Secondary | ICD-10-CM | POA: Diagnosis not present

## 2018-07-30 DIAGNOSIS — T8189XA Other complications of procedures, not elsewhere classified, initial encounter: Secondary | ICD-10-CM | POA: Diagnosis not present

## 2018-07-31 DIAGNOSIS — T8189XA Other complications of procedures, not elsewhere classified, initial encounter: Secondary | ICD-10-CM | POA: Diagnosis not present

## 2018-08-01 DIAGNOSIS — T8189XA Other complications of procedures, not elsewhere classified, initial encounter: Secondary | ICD-10-CM | POA: Diagnosis not present

## 2018-08-01 DIAGNOSIS — Z4800 Encounter for change or removal of nonsurgical wound dressing: Secondary | ICD-10-CM | POA: Diagnosis not present

## 2018-08-01 DIAGNOSIS — Z86718 Personal history of other venous thrombosis and embolism: Secondary | ICD-10-CM | POA: Diagnosis not present

## 2018-08-01 DIAGNOSIS — I10 Essential (primary) hypertension: Secondary | ICD-10-CM | POA: Diagnosis not present

## 2018-08-01 DIAGNOSIS — E1151 Type 2 diabetes mellitus with diabetic peripheral angiopathy without gangrene: Secondary | ICD-10-CM | POA: Diagnosis not present

## 2018-08-01 DIAGNOSIS — K219 Gastro-esophageal reflux disease without esophagitis: Secondary | ICD-10-CM | POA: Diagnosis not present

## 2018-08-01 DIAGNOSIS — M1990 Unspecified osteoarthritis, unspecified site: Secondary | ICD-10-CM | POA: Diagnosis not present

## 2018-08-01 DIAGNOSIS — T8149XA Infection following a procedure, other surgical site, initial encounter: Secondary | ICD-10-CM | POA: Diagnosis not present

## 2018-08-01 DIAGNOSIS — Z79891 Long term (current) use of opiate analgesic: Secondary | ICD-10-CM | POA: Diagnosis not present

## 2018-08-01 DIAGNOSIS — Z87891 Personal history of nicotine dependence: Secondary | ICD-10-CM | POA: Diagnosis not present

## 2018-08-01 DIAGNOSIS — H409 Unspecified glaucoma: Secondary | ICD-10-CM | POA: Diagnosis not present

## 2018-08-01 DIAGNOSIS — T8141XA Infection following a procedure, superficial incisional surgical site, initial encounter: Secondary | ICD-10-CM | POA: Diagnosis not present

## 2018-08-01 DIAGNOSIS — Z7901 Long term (current) use of anticoagulants: Secondary | ICD-10-CM | POA: Diagnosis not present

## 2018-08-01 DIAGNOSIS — Z86711 Personal history of pulmonary embolism: Secondary | ICD-10-CM | POA: Diagnosis not present

## 2018-08-02 DIAGNOSIS — T8189XA Other complications of procedures, not elsewhere classified, initial encounter: Secondary | ICD-10-CM | POA: Diagnosis not present

## 2018-08-03 DIAGNOSIS — T8149XA Infection following a procedure, other surgical site, initial encounter: Secondary | ICD-10-CM | POA: Diagnosis not present

## 2018-08-03 DIAGNOSIS — Z7901 Long term (current) use of anticoagulants: Secondary | ICD-10-CM | POA: Diagnosis not present

## 2018-08-03 DIAGNOSIS — Z4800 Encounter for change or removal of nonsurgical wound dressing: Secondary | ICD-10-CM | POA: Diagnosis not present

## 2018-08-03 DIAGNOSIS — T8189XA Other complications of procedures, not elsewhere classified, initial encounter: Secondary | ICD-10-CM | POA: Diagnosis not present

## 2018-08-03 DIAGNOSIS — Z79891 Long term (current) use of opiate analgesic: Secondary | ICD-10-CM | POA: Diagnosis not present

## 2018-08-03 DIAGNOSIS — Z86711 Personal history of pulmonary embolism: Secondary | ICD-10-CM | POA: Diagnosis not present

## 2018-08-03 DIAGNOSIS — K219 Gastro-esophageal reflux disease without esophagitis: Secondary | ICD-10-CM | POA: Diagnosis not present

## 2018-08-03 DIAGNOSIS — T8141XA Infection following a procedure, superficial incisional surgical site, initial encounter: Secondary | ICD-10-CM | POA: Diagnosis not present

## 2018-08-03 DIAGNOSIS — E1151 Type 2 diabetes mellitus with diabetic peripheral angiopathy without gangrene: Secondary | ICD-10-CM | POA: Diagnosis not present

## 2018-08-03 DIAGNOSIS — H409 Unspecified glaucoma: Secondary | ICD-10-CM | POA: Diagnosis not present

## 2018-08-03 DIAGNOSIS — M1990 Unspecified osteoarthritis, unspecified site: Secondary | ICD-10-CM | POA: Diagnosis not present

## 2018-08-03 DIAGNOSIS — Z87891 Personal history of nicotine dependence: Secondary | ICD-10-CM | POA: Diagnosis not present

## 2018-08-03 DIAGNOSIS — Z86718 Personal history of other venous thrombosis and embolism: Secondary | ICD-10-CM | POA: Diagnosis not present

## 2018-08-03 DIAGNOSIS — I10 Essential (primary) hypertension: Secondary | ICD-10-CM | POA: Diagnosis not present

## 2018-08-04 DIAGNOSIS — T8189XA Other complications of procedures, not elsewhere classified, initial encounter: Secondary | ICD-10-CM | POA: Diagnosis not present

## 2018-08-05 DIAGNOSIS — T8189XA Other complications of procedures, not elsewhere classified, initial encounter: Secondary | ICD-10-CM | POA: Diagnosis not present

## 2018-08-06 DIAGNOSIS — K219 Gastro-esophageal reflux disease without esophagitis: Secondary | ICD-10-CM | POA: Diagnosis not present

## 2018-08-06 DIAGNOSIS — T8149XA Infection following a procedure, other surgical site, initial encounter: Secondary | ICD-10-CM | POA: Diagnosis not present

## 2018-08-06 DIAGNOSIS — I10 Essential (primary) hypertension: Secondary | ICD-10-CM | POA: Diagnosis not present

## 2018-08-06 DIAGNOSIS — Z79891 Long term (current) use of opiate analgesic: Secondary | ICD-10-CM | POA: Diagnosis not present

## 2018-08-06 DIAGNOSIS — M1990 Unspecified osteoarthritis, unspecified site: Secondary | ICD-10-CM | POA: Diagnosis not present

## 2018-08-06 DIAGNOSIS — Z86718 Personal history of other venous thrombosis and embolism: Secondary | ICD-10-CM | POA: Diagnosis not present

## 2018-08-06 DIAGNOSIS — Z4800 Encounter for change or removal of nonsurgical wound dressing: Secondary | ICD-10-CM | POA: Diagnosis not present

## 2018-08-06 DIAGNOSIS — T8141XA Infection following a procedure, superficial incisional surgical site, initial encounter: Secondary | ICD-10-CM | POA: Diagnosis not present

## 2018-08-06 DIAGNOSIS — T8189XA Other complications of procedures, not elsewhere classified, initial encounter: Secondary | ICD-10-CM | POA: Diagnosis not present

## 2018-08-06 DIAGNOSIS — Z87891 Personal history of nicotine dependence: Secondary | ICD-10-CM | POA: Diagnosis not present

## 2018-08-06 DIAGNOSIS — Z86711 Personal history of pulmonary embolism: Secondary | ICD-10-CM | POA: Diagnosis not present

## 2018-08-06 DIAGNOSIS — E1151 Type 2 diabetes mellitus with diabetic peripheral angiopathy without gangrene: Secondary | ICD-10-CM | POA: Diagnosis not present

## 2018-08-06 DIAGNOSIS — Z7901 Long term (current) use of anticoagulants: Secondary | ICD-10-CM | POA: Diagnosis not present

## 2018-08-06 DIAGNOSIS — H409 Unspecified glaucoma: Secondary | ICD-10-CM | POA: Diagnosis not present

## 2018-08-07 DIAGNOSIS — T8189XA Other complications of procedures, not elsewhere classified, initial encounter: Secondary | ICD-10-CM | POA: Diagnosis not present

## 2018-08-08 DIAGNOSIS — Z79891 Long term (current) use of opiate analgesic: Secondary | ICD-10-CM | POA: Diagnosis not present

## 2018-08-08 DIAGNOSIS — Z86718 Personal history of other venous thrombosis and embolism: Secondary | ICD-10-CM | POA: Diagnosis not present

## 2018-08-08 DIAGNOSIS — I739 Peripheral vascular disease, unspecified: Secondary | ICD-10-CM | POA: Diagnosis not present

## 2018-08-08 DIAGNOSIS — I82401 Acute embolism and thrombosis of unspecified deep veins of right lower extremity: Secondary | ICD-10-CM | POA: Diagnosis not present

## 2018-08-08 DIAGNOSIS — K219 Gastro-esophageal reflux disease without esophagitis: Secondary | ICD-10-CM | POA: Diagnosis not present

## 2018-08-08 DIAGNOSIS — Z86711 Personal history of pulmonary embolism: Secondary | ICD-10-CM | POA: Diagnosis not present

## 2018-08-08 DIAGNOSIS — M545 Low back pain: Secondary | ICD-10-CM | POA: Diagnosis not present

## 2018-08-08 DIAGNOSIS — Z4800 Encounter for change or removal of nonsurgical wound dressing: Secondary | ICD-10-CM | POA: Diagnosis not present

## 2018-08-08 DIAGNOSIS — H409 Unspecified glaucoma: Secondary | ICD-10-CM | POA: Diagnosis not present

## 2018-08-08 DIAGNOSIS — E1151 Type 2 diabetes mellitus with diabetic peripheral angiopathy without gangrene: Secondary | ICD-10-CM | POA: Diagnosis not present

## 2018-08-08 DIAGNOSIS — Z7901 Long term (current) use of anticoagulants: Secondary | ICD-10-CM | POA: Diagnosis not present

## 2018-08-08 DIAGNOSIS — T8149XA Infection following a procedure, other surgical site, initial encounter: Secondary | ICD-10-CM | POA: Diagnosis not present

## 2018-08-08 DIAGNOSIS — M1990 Unspecified osteoarthritis, unspecified site: Secondary | ICD-10-CM | POA: Diagnosis not present

## 2018-08-08 DIAGNOSIS — T8141XA Infection following a procedure, superficial incisional surgical site, initial encounter: Secondary | ICD-10-CM | POA: Diagnosis not present

## 2018-08-08 DIAGNOSIS — I2699 Other pulmonary embolism without acute cor pulmonale: Secondary | ICD-10-CM | POA: Diagnosis not present

## 2018-08-08 DIAGNOSIS — M792 Neuralgia and neuritis, unspecified: Secondary | ICD-10-CM | POA: Diagnosis not present

## 2018-08-08 DIAGNOSIS — I82411 Acute embolism and thrombosis of right femoral vein: Secondary | ICD-10-CM | POA: Diagnosis not present

## 2018-08-08 DIAGNOSIS — T8189XA Other complications of procedures, not elsewhere classified, initial encounter: Secondary | ICD-10-CM | POA: Diagnosis not present

## 2018-08-08 DIAGNOSIS — I10 Essential (primary) hypertension: Secondary | ICD-10-CM | POA: Diagnosis not present

## 2018-08-08 DIAGNOSIS — Z87891 Personal history of nicotine dependence: Secondary | ICD-10-CM | POA: Diagnosis not present

## 2018-08-08 DIAGNOSIS — Z23 Encounter for immunization: Secondary | ICD-10-CM | POA: Diagnosis not present

## 2018-08-09 DIAGNOSIS — T8189XA Other complications of procedures, not elsewhere classified, initial encounter: Secondary | ICD-10-CM | POA: Diagnosis not present

## 2018-08-10 DIAGNOSIS — T8141XA Infection following a procedure, superficial incisional surgical site, initial encounter: Secondary | ICD-10-CM | POA: Diagnosis not present

## 2018-08-10 DIAGNOSIS — H409 Unspecified glaucoma: Secondary | ICD-10-CM | POA: Diagnosis not present

## 2018-08-10 DIAGNOSIS — T8189XA Other complications of procedures, not elsewhere classified, initial encounter: Secondary | ICD-10-CM | POA: Diagnosis not present

## 2018-08-10 DIAGNOSIS — Z7901 Long term (current) use of anticoagulants: Secondary | ICD-10-CM | POA: Diagnosis not present

## 2018-08-10 DIAGNOSIS — I10 Essential (primary) hypertension: Secondary | ICD-10-CM | POA: Diagnosis not present

## 2018-08-10 DIAGNOSIS — Z4800 Encounter for change or removal of nonsurgical wound dressing: Secondary | ICD-10-CM | POA: Diagnosis not present

## 2018-08-10 DIAGNOSIS — Z87891 Personal history of nicotine dependence: Secondary | ICD-10-CM | POA: Diagnosis not present

## 2018-08-10 DIAGNOSIS — E1151 Type 2 diabetes mellitus with diabetic peripheral angiopathy without gangrene: Secondary | ICD-10-CM | POA: Diagnosis not present

## 2018-08-10 DIAGNOSIS — Z79891 Long term (current) use of opiate analgesic: Secondary | ICD-10-CM | POA: Diagnosis not present

## 2018-08-10 DIAGNOSIS — T8149XA Infection following a procedure, other surgical site, initial encounter: Secondary | ICD-10-CM | POA: Diagnosis not present

## 2018-08-10 DIAGNOSIS — Z86718 Personal history of other venous thrombosis and embolism: Secondary | ICD-10-CM | POA: Diagnosis not present

## 2018-08-10 DIAGNOSIS — M1990 Unspecified osteoarthritis, unspecified site: Secondary | ICD-10-CM | POA: Diagnosis not present

## 2018-08-10 DIAGNOSIS — K219 Gastro-esophageal reflux disease without esophagitis: Secondary | ICD-10-CM | POA: Diagnosis not present

## 2018-08-10 DIAGNOSIS — Z86711 Personal history of pulmonary embolism: Secondary | ICD-10-CM | POA: Diagnosis not present

## 2018-08-11 DIAGNOSIS — T8189XA Other complications of procedures, not elsewhere classified, initial encounter: Secondary | ICD-10-CM | POA: Diagnosis not present

## 2018-08-12 DIAGNOSIS — T8189XA Other complications of procedures, not elsewhere classified, initial encounter: Secondary | ICD-10-CM | POA: Diagnosis not present

## 2018-08-13 DIAGNOSIS — H409 Unspecified glaucoma: Secondary | ICD-10-CM | POA: Diagnosis not present

## 2018-08-13 DIAGNOSIS — M1990 Unspecified osteoarthritis, unspecified site: Secondary | ICD-10-CM | POA: Diagnosis not present

## 2018-08-13 DIAGNOSIS — T8149XA Infection following a procedure, other surgical site, initial encounter: Secondary | ICD-10-CM | POA: Diagnosis not present

## 2018-08-13 DIAGNOSIS — T8141XA Infection following a procedure, superficial incisional surgical site, initial encounter: Secondary | ICD-10-CM | POA: Diagnosis not present

## 2018-08-13 DIAGNOSIS — Z79891 Long term (current) use of opiate analgesic: Secondary | ICD-10-CM | POA: Diagnosis not present

## 2018-08-13 DIAGNOSIS — K219 Gastro-esophageal reflux disease without esophagitis: Secondary | ICD-10-CM | POA: Diagnosis not present

## 2018-08-13 DIAGNOSIS — Z86718 Personal history of other venous thrombosis and embolism: Secondary | ICD-10-CM | POA: Diagnosis not present

## 2018-08-13 DIAGNOSIS — E1151 Type 2 diabetes mellitus with diabetic peripheral angiopathy without gangrene: Secondary | ICD-10-CM | POA: Diagnosis not present

## 2018-08-13 DIAGNOSIS — T8189XA Other complications of procedures, not elsewhere classified, initial encounter: Secondary | ICD-10-CM | POA: Diagnosis not present

## 2018-08-13 DIAGNOSIS — I10 Essential (primary) hypertension: Secondary | ICD-10-CM | POA: Diagnosis not present

## 2018-08-13 DIAGNOSIS — Z4800 Encounter for change or removal of nonsurgical wound dressing: Secondary | ICD-10-CM | POA: Diagnosis not present

## 2018-08-13 DIAGNOSIS — Z87891 Personal history of nicotine dependence: Secondary | ICD-10-CM | POA: Diagnosis not present

## 2018-08-13 DIAGNOSIS — Z86711 Personal history of pulmonary embolism: Secondary | ICD-10-CM | POA: Diagnosis not present

## 2018-08-13 DIAGNOSIS — Z7901 Long term (current) use of anticoagulants: Secondary | ICD-10-CM | POA: Diagnosis not present

## 2018-08-14 DIAGNOSIS — T8189XA Other complications of procedures, not elsewhere classified, initial encounter: Secondary | ICD-10-CM | POA: Diagnosis not present

## 2018-08-15 DIAGNOSIS — T8141XA Infection following a procedure, superficial incisional surgical site, initial encounter: Secondary | ICD-10-CM | POA: Diagnosis not present

## 2018-08-15 DIAGNOSIS — H409 Unspecified glaucoma: Secondary | ICD-10-CM | POA: Diagnosis not present

## 2018-08-15 DIAGNOSIS — I10 Essential (primary) hypertension: Secondary | ICD-10-CM | POA: Diagnosis not present

## 2018-08-15 DIAGNOSIS — Z87891 Personal history of nicotine dependence: Secondary | ICD-10-CM | POA: Diagnosis not present

## 2018-08-15 DIAGNOSIS — M1990 Unspecified osteoarthritis, unspecified site: Secondary | ICD-10-CM | POA: Diagnosis not present

## 2018-08-15 DIAGNOSIS — Z4800 Encounter for change or removal of nonsurgical wound dressing: Secondary | ICD-10-CM | POA: Diagnosis not present

## 2018-08-15 DIAGNOSIS — K219 Gastro-esophageal reflux disease without esophagitis: Secondary | ICD-10-CM | POA: Diagnosis not present

## 2018-08-15 DIAGNOSIS — Z79891 Long term (current) use of opiate analgesic: Secondary | ICD-10-CM | POA: Diagnosis not present

## 2018-08-15 DIAGNOSIS — Z86718 Personal history of other venous thrombosis and embolism: Secondary | ICD-10-CM | POA: Diagnosis not present

## 2018-08-15 DIAGNOSIS — T8149XA Infection following a procedure, other surgical site, initial encounter: Secondary | ICD-10-CM | POA: Diagnosis not present

## 2018-08-15 DIAGNOSIS — E1151 Type 2 diabetes mellitus with diabetic peripheral angiopathy without gangrene: Secondary | ICD-10-CM | POA: Diagnosis not present

## 2018-08-15 DIAGNOSIS — T8189XA Other complications of procedures, not elsewhere classified, initial encounter: Secondary | ICD-10-CM | POA: Diagnosis not present

## 2018-08-15 DIAGNOSIS — Z86711 Personal history of pulmonary embolism: Secondary | ICD-10-CM | POA: Diagnosis not present

## 2018-08-15 DIAGNOSIS — Z7901 Long term (current) use of anticoagulants: Secondary | ICD-10-CM | POA: Diagnosis not present

## 2018-08-16 DIAGNOSIS — T8189XA Other complications of procedures, not elsewhere classified, initial encounter: Secondary | ICD-10-CM | POA: Diagnosis not present

## 2018-08-16 DIAGNOSIS — Z86718 Personal history of other venous thrombosis and embolism: Secondary | ICD-10-CM | POA: Diagnosis not present

## 2018-08-16 DIAGNOSIS — Z4800 Encounter for change or removal of nonsurgical wound dressing: Secondary | ICD-10-CM | POA: Diagnosis not present

## 2018-08-16 DIAGNOSIS — E1151 Type 2 diabetes mellitus with diabetic peripheral angiopathy without gangrene: Secondary | ICD-10-CM | POA: Diagnosis not present

## 2018-08-16 DIAGNOSIS — I10 Essential (primary) hypertension: Secondary | ICD-10-CM | POA: Diagnosis not present

## 2018-08-16 DIAGNOSIS — T8149XA Infection following a procedure, other surgical site, initial encounter: Secondary | ICD-10-CM | POA: Diagnosis not present

## 2018-08-16 DIAGNOSIS — Z86711 Personal history of pulmonary embolism: Secondary | ICD-10-CM | POA: Diagnosis not present

## 2018-08-16 DIAGNOSIS — H409 Unspecified glaucoma: Secondary | ICD-10-CM | POA: Diagnosis not present

## 2018-08-16 DIAGNOSIS — Z7901 Long term (current) use of anticoagulants: Secondary | ICD-10-CM | POA: Diagnosis not present

## 2018-08-16 DIAGNOSIS — Z87891 Personal history of nicotine dependence: Secondary | ICD-10-CM | POA: Diagnosis not present

## 2018-08-16 DIAGNOSIS — K219 Gastro-esophageal reflux disease without esophagitis: Secondary | ICD-10-CM | POA: Diagnosis not present

## 2018-08-16 DIAGNOSIS — Z79891 Long term (current) use of opiate analgesic: Secondary | ICD-10-CM | POA: Diagnosis not present

## 2018-08-16 DIAGNOSIS — M1990 Unspecified osteoarthritis, unspecified site: Secondary | ICD-10-CM | POA: Diagnosis not present

## 2018-08-16 DIAGNOSIS — T8141XA Infection following a procedure, superficial incisional surgical site, initial encounter: Secondary | ICD-10-CM | POA: Diagnosis not present

## 2018-08-17 DIAGNOSIS — T8189XA Other complications of procedures, not elsewhere classified, initial encounter: Secondary | ICD-10-CM | POA: Diagnosis not present

## 2018-08-17 DIAGNOSIS — I10 Essential (primary) hypertension: Secondary | ICD-10-CM | POA: Diagnosis not present

## 2018-08-17 DIAGNOSIS — T8149XA Infection following a procedure, other surgical site, initial encounter: Secondary | ICD-10-CM | POA: Diagnosis not present

## 2018-08-17 DIAGNOSIS — Z87891 Personal history of nicotine dependence: Secondary | ICD-10-CM | POA: Diagnosis not present

## 2018-08-17 DIAGNOSIS — T8141XA Infection following a procedure, superficial incisional surgical site, initial encounter: Secondary | ICD-10-CM | POA: Diagnosis not present

## 2018-08-17 DIAGNOSIS — Z7901 Long term (current) use of anticoagulants: Secondary | ICD-10-CM | POA: Diagnosis not present

## 2018-08-17 DIAGNOSIS — Z79891 Long term (current) use of opiate analgesic: Secondary | ICD-10-CM | POA: Diagnosis not present

## 2018-08-17 DIAGNOSIS — Z4800 Encounter for change or removal of nonsurgical wound dressing: Secondary | ICD-10-CM | POA: Diagnosis not present

## 2018-08-17 DIAGNOSIS — H409 Unspecified glaucoma: Secondary | ICD-10-CM | POA: Diagnosis not present

## 2018-08-17 DIAGNOSIS — Z86711 Personal history of pulmonary embolism: Secondary | ICD-10-CM | POA: Diagnosis not present

## 2018-08-17 DIAGNOSIS — Z86718 Personal history of other venous thrombosis and embolism: Secondary | ICD-10-CM | POA: Diagnosis not present

## 2018-08-17 DIAGNOSIS — K219 Gastro-esophageal reflux disease without esophagitis: Secondary | ICD-10-CM | POA: Diagnosis not present

## 2018-08-17 DIAGNOSIS — E1151 Type 2 diabetes mellitus with diabetic peripheral angiopathy without gangrene: Secondary | ICD-10-CM | POA: Diagnosis not present

## 2018-08-17 DIAGNOSIS — M1990 Unspecified osteoarthritis, unspecified site: Secondary | ICD-10-CM | POA: Diagnosis not present

## 2018-08-18 DIAGNOSIS — T8189XA Other complications of procedures, not elsewhere classified, initial encounter: Secondary | ICD-10-CM | POA: Diagnosis not present

## 2018-08-19 DIAGNOSIS — T8189XA Other complications of procedures, not elsewhere classified, initial encounter: Secondary | ICD-10-CM | POA: Diagnosis not present

## 2018-08-20 DIAGNOSIS — I10 Essential (primary) hypertension: Secondary | ICD-10-CM | POA: Diagnosis not present

## 2018-08-20 DIAGNOSIS — M1990 Unspecified osteoarthritis, unspecified site: Secondary | ICD-10-CM | POA: Diagnosis not present

## 2018-08-20 DIAGNOSIS — Z7901 Long term (current) use of anticoagulants: Secondary | ICD-10-CM | POA: Diagnosis not present

## 2018-08-20 DIAGNOSIS — Z86711 Personal history of pulmonary embolism: Secondary | ICD-10-CM | POA: Diagnosis not present

## 2018-08-20 DIAGNOSIS — E1151 Type 2 diabetes mellitus with diabetic peripheral angiopathy without gangrene: Secondary | ICD-10-CM | POA: Diagnosis not present

## 2018-08-20 DIAGNOSIS — T8149XA Infection following a procedure, other surgical site, initial encounter: Secondary | ICD-10-CM | POA: Diagnosis not present

## 2018-08-20 DIAGNOSIS — Z86718 Personal history of other venous thrombosis and embolism: Secondary | ICD-10-CM | POA: Diagnosis not present

## 2018-08-20 DIAGNOSIS — Z87891 Personal history of nicotine dependence: Secondary | ICD-10-CM | POA: Diagnosis not present

## 2018-08-20 DIAGNOSIS — H409 Unspecified glaucoma: Secondary | ICD-10-CM | POA: Diagnosis not present

## 2018-08-20 DIAGNOSIS — T8141XA Infection following a procedure, superficial incisional surgical site, initial encounter: Secondary | ICD-10-CM | POA: Diagnosis not present

## 2018-08-20 DIAGNOSIS — T8189XA Other complications of procedures, not elsewhere classified, initial encounter: Secondary | ICD-10-CM | POA: Diagnosis not present

## 2018-08-20 DIAGNOSIS — Z79891 Long term (current) use of opiate analgesic: Secondary | ICD-10-CM | POA: Diagnosis not present

## 2018-08-20 DIAGNOSIS — K219 Gastro-esophageal reflux disease without esophagitis: Secondary | ICD-10-CM | POA: Diagnosis not present

## 2018-08-20 DIAGNOSIS — Z4800 Encounter for change or removal of nonsurgical wound dressing: Secondary | ICD-10-CM | POA: Diagnosis not present

## 2018-08-21 DIAGNOSIS — T8189XA Other complications of procedures, not elsewhere classified, initial encounter: Secondary | ICD-10-CM | POA: Diagnosis not present

## 2018-08-22 DIAGNOSIS — Z86711 Personal history of pulmonary embolism: Secondary | ICD-10-CM | POA: Diagnosis not present

## 2018-08-22 DIAGNOSIS — T8149XA Infection following a procedure, other surgical site, initial encounter: Secondary | ICD-10-CM | POA: Diagnosis not present

## 2018-08-22 DIAGNOSIS — T8141XA Infection following a procedure, superficial incisional surgical site, initial encounter: Secondary | ICD-10-CM | POA: Diagnosis not present

## 2018-08-22 DIAGNOSIS — I10 Essential (primary) hypertension: Secondary | ICD-10-CM | POA: Diagnosis not present

## 2018-08-22 DIAGNOSIS — Z79891 Long term (current) use of opiate analgesic: Secondary | ICD-10-CM | POA: Diagnosis not present

## 2018-08-22 DIAGNOSIS — Z86718 Personal history of other venous thrombosis and embolism: Secondary | ICD-10-CM | POA: Diagnosis not present

## 2018-08-22 DIAGNOSIS — M1990 Unspecified osteoarthritis, unspecified site: Secondary | ICD-10-CM | POA: Diagnosis not present

## 2018-08-22 DIAGNOSIS — K219 Gastro-esophageal reflux disease without esophagitis: Secondary | ICD-10-CM | POA: Diagnosis not present

## 2018-08-22 DIAGNOSIS — E1151 Type 2 diabetes mellitus with diabetic peripheral angiopathy without gangrene: Secondary | ICD-10-CM | POA: Diagnosis not present

## 2018-08-22 DIAGNOSIS — Z4800 Encounter for change or removal of nonsurgical wound dressing: Secondary | ICD-10-CM | POA: Diagnosis not present

## 2018-08-22 DIAGNOSIS — Z7901 Long term (current) use of anticoagulants: Secondary | ICD-10-CM | POA: Diagnosis not present

## 2018-08-22 DIAGNOSIS — Z87891 Personal history of nicotine dependence: Secondary | ICD-10-CM | POA: Diagnosis not present

## 2018-08-22 DIAGNOSIS — H409 Unspecified glaucoma: Secondary | ICD-10-CM | POA: Diagnosis not present

## 2018-08-24 DIAGNOSIS — Z79891 Long term (current) use of opiate analgesic: Secondary | ICD-10-CM | POA: Diagnosis not present

## 2018-08-24 DIAGNOSIS — K219 Gastro-esophageal reflux disease without esophagitis: Secondary | ICD-10-CM | POA: Diagnosis not present

## 2018-08-24 DIAGNOSIS — T8141XA Infection following a procedure, superficial incisional surgical site, initial encounter: Secondary | ICD-10-CM | POA: Diagnosis not present

## 2018-08-24 DIAGNOSIS — Z87891 Personal history of nicotine dependence: Secondary | ICD-10-CM | POA: Diagnosis not present

## 2018-08-24 DIAGNOSIS — Z4800 Encounter for change or removal of nonsurgical wound dressing: Secondary | ICD-10-CM | POA: Diagnosis not present

## 2018-08-24 DIAGNOSIS — I10 Essential (primary) hypertension: Secondary | ICD-10-CM | POA: Diagnosis not present

## 2018-08-24 DIAGNOSIS — E1151 Type 2 diabetes mellitus with diabetic peripheral angiopathy without gangrene: Secondary | ICD-10-CM | POA: Diagnosis not present

## 2018-08-24 DIAGNOSIS — Z7901 Long term (current) use of anticoagulants: Secondary | ICD-10-CM | POA: Diagnosis not present

## 2018-08-24 DIAGNOSIS — T8149XA Infection following a procedure, other surgical site, initial encounter: Secondary | ICD-10-CM | POA: Diagnosis not present

## 2018-08-24 DIAGNOSIS — Z86718 Personal history of other venous thrombosis and embolism: Secondary | ICD-10-CM | POA: Diagnosis not present

## 2018-08-24 DIAGNOSIS — M1990 Unspecified osteoarthritis, unspecified site: Secondary | ICD-10-CM | POA: Diagnosis not present

## 2018-08-24 DIAGNOSIS — Z86711 Personal history of pulmonary embolism: Secondary | ICD-10-CM | POA: Diagnosis not present

## 2018-08-24 DIAGNOSIS — H409 Unspecified glaucoma: Secondary | ICD-10-CM | POA: Diagnosis not present

## 2018-08-27 DIAGNOSIS — I739 Peripheral vascular disease, unspecified: Secondary | ICD-10-CM | POA: Diagnosis not present

## 2018-08-27 DIAGNOSIS — T8141XA Infection following a procedure, superficial incisional surgical site, initial encounter: Secondary | ICD-10-CM | POA: Diagnosis not present

## 2018-08-27 DIAGNOSIS — Z86711 Personal history of pulmonary embolism: Secondary | ICD-10-CM | POA: Diagnosis not present

## 2018-08-27 DIAGNOSIS — H409 Unspecified glaucoma: Secondary | ICD-10-CM | POA: Diagnosis not present

## 2018-08-27 DIAGNOSIS — M1990 Unspecified osteoarthritis, unspecified site: Secondary | ICD-10-CM | POA: Diagnosis not present

## 2018-08-27 DIAGNOSIS — Z79891 Long term (current) use of opiate analgesic: Secondary | ICD-10-CM | POA: Diagnosis not present

## 2018-08-27 DIAGNOSIS — Z4800 Encounter for change or removal of nonsurgical wound dressing: Secondary | ICD-10-CM | POA: Diagnosis not present

## 2018-08-27 DIAGNOSIS — I10 Essential (primary) hypertension: Secondary | ICD-10-CM | POA: Diagnosis not present

## 2018-08-27 DIAGNOSIS — K219 Gastro-esophageal reflux disease without esophagitis: Secondary | ICD-10-CM | POA: Diagnosis not present

## 2018-08-27 DIAGNOSIS — Z86718 Personal history of other venous thrombosis and embolism: Secondary | ICD-10-CM | POA: Diagnosis not present

## 2018-08-27 DIAGNOSIS — T8189XA Other complications of procedures, not elsewhere classified, initial encounter: Secondary | ICD-10-CM | POA: Diagnosis not present

## 2018-08-27 DIAGNOSIS — E1151 Type 2 diabetes mellitus with diabetic peripheral angiopathy without gangrene: Secondary | ICD-10-CM | POA: Diagnosis not present

## 2018-08-27 DIAGNOSIS — T8149XA Infection following a procedure, other surgical site, initial encounter: Secondary | ICD-10-CM | POA: Diagnosis not present

## 2018-08-27 DIAGNOSIS — Z7901 Long term (current) use of anticoagulants: Secondary | ICD-10-CM | POA: Diagnosis not present

## 2018-08-27 DIAGNOSIS — Z87891 Personal history of nicotine dependence: Secondary | ICD-10-CM | POA: Diagnosis not present

## 2018-08-28 DIAGNOSIS — T8189XA Other complications of procedures, not elsewhere classified, initial encounter: Secondary | ICD-10-CM | POA: Diagnosis not present

## 2018-08-29 DIAGNOSIS — Z86718 Personal history of other venous thrombosis and embolism: Secondary | ICD-10-CM | POA: Diagnosis not present

## 2018-08-29 DIAGNOSIS — M1990 Unspecified osteoarthritis, unspecified site: Secondary | ICD-10-CM | POA: Diagnosis not present

## 2018-08-29 DIAGNOSIS — E1151 Type 2 diabetes mellitus with diabetic peripheral angiopathy without gangrene: Secondary | ICD-10-CM | POA: Diagnosis not present

## 2018-08-29 DIAGNOSIS — Z4800 Encounter for change or removal of nonsurgical wound dressing: Secondary | ICD-10-CM | POA: Diagnosis not present

## 2018-08-29 DIAGNOSIS — K219 Gastro-esophageal reflux disease without esophagitis: Secondary | ICD-10-CM | POA: Diagnosis not present

## 2018-08-29 DIAGNOSIS — Z87891 Personal history of nicotine dependence: Secondary | ICD-10-CM | POA: Diagnosis not present

## 2018-08-29 DIAGNOSIS — Z7901 Long term (current) use of anticoagulants: Secondary | ICD-10-CM | POA: Diagnosis not present

## 2018-08-29 DIAGNOSIS — T8141XA Infection following a procedure, superficial incisional surgical site, initial encounter: Secondary | ICD-10-CM | POA: Diagnosis not present

## 2018-08-29 DIAGNOSIS — T8189XA Other complications of procedures, not elsewhere classified, initial encounter: Secondary | ICD-10-CM | POA: Diagnosis not present

## 2018-08-29 DIAGNOSIS — H409 Unspecified glaucoma: Secondary | ICD-10-CM | POA: Diagnosis not present

## 2018-08-29 DIAGNOSIS — I10 Essential (primary) hypertension: Secondary | ICD-10-CM | POA: Diagnosis not present

## 2018-08-29 DIAGNOSIS — Z79891 Long term (current) use of opiate analgesic: Secondary | ICD-10-CM | POA: Diagnosis not present

## 2018-08-29 DIAGNOSIS — Z86711 Personal history of pulmonary embolism: Secondary | ICD-10-CM | POA: Diagnosis not present

## 2018-08-29 DIAGNOSIS — T8149XA Infection following a procedure, other surgical site, initial encounter: Secondary | ICD-10-CM | POA: Diagnosis not present

## 2018-08-30 DIAGNOSIS — T8141XA Infection following a procedure, superficial incisional surgical site, initial encounter: Secondary | ICD-10-CM | POA: Diagnosis not present

## 2018-08-30 DIAGNOSIS — T8189XA Other complications of procedures, not elsewhere classified, initial encounter: Secondary | ICD-10-CM | POA: Diagnosis not present

## 2018-08-30 DIAGNOSIS — K219 Gastro-esophageal reflux disease without esophagitis: Secondary | ICD-10-CM | POA: Diagnosis not present

## 2018-08-30 DIAGNOSIS — E1151 Type 2 diabetes mellitus with diabetic peripheral angiopathy without gangrene: Secondary | ICD-10-CM | POA: Diagnosis not present

## 2018-08-30 DIAGNOSIS — H409 Unspecified glaucoma: Secondary | ICD-10-CM | POA: Diagnosis not present

## 2018-08-30 DIAGNOSIS — Z4800 Encounter for change or removal of nonsurgical wound dressing: Secondary | ICD-10-CM | POA: Diagnosis not present

## 2018-08-30 DIAGNOSIS — M1990 Unspecified osteoarthritis, unspecified site: Secondary | ICD-10-CM | POA: Diagnosis not present

## 2018-08-30 DIAGNOSIS — Z87891 Personal history of nicotine dependence: Secondary | ICD-10-CM | POA: Diagnosis not present

## 2018-08-30 DIAGNOSIS — Z79891 Long term (current) use of opiate analgesic: Secondary | ICD-10-CM | POA: Diagnosis not present

## 2018-08-30 DIAGNOSIS — Z86718 Personal history of other venous thrombosis and embolism: Secondary | ICD-10-CM | POA: Diagnosis not present

## 2018-08-30 DIAGNOSIS — I10 Essential (primary) hypertension: Secondary | ICD-10-CM | POA: Diagnosis not present

## 2018-08-30 DIAGNOSIS — Z7901 Long term (current) use of anticoagulants: Secondary | ICD-10-CM | POA: Diagnosis not present

## 2018-08-30 DIAGNOSIS — Z86711 Personal history of pulmonary embolism: Secondary | ICD-10-CM | POA: Diagnosis not present

## 2018-08-30 DIAGNOSIS — T8149XA Infection following a procedure, other surgical site, initial encounter: Secondary | ICD-10-CM | POA: Diagnosis not present

## 2018-08-31 DIAGNOSIS — T8189XA Other complications of procedures, not elsewhere classified, initial encounter: Secondary | ICD-10-CM | POA: Diagnosis not present

## 2018-09-01 DIAGNOSIS — T8189XA Other complications of procedures, not elsewhere classified, initial encounter: Secondary | ICD-10-CM | POA: Diagnosis not present

## 2018-09-03 DIAGNOSIS — M1990 Unspecified osteoarthritis, unspecified site: Secondary | ICD-10-CM | POA: Diagnosis not present

## 2018-09-03 DIAGNOSIS — Z86718 Personal history of other venous thrombosis and embolism: Secondary | ICD-10-CM | POA: Diagnosis not present

## 2018-09-03 DIAGNOSIS — I10 Essential (primary) hypertension: Secondary | ICD-10-CM | POA: Diagnosis not present

## 2018-09-03 DIAGNOSIS — T8149XA Infection following a procedure, other surgical site, initial encounter: Secondary | ICD-10-CM | POA: Diagnosis not present

## 2018-09-03 DIAGNOSIS — T8141XA Infection following a procedure, superficial incisional surgical site, initial encounter: Secondary | ICD-10-CM | POA: Diagnosis not present

## 2018-09-03 DIAGNOSIS — Z7901 Long term (current) use of anticoagulants: Secondary | ICD-10-CM | POA: Diagnosis not present

## 2018-09-03 DIAGNOSIS — Z4800 Encounter for change or removal of nonsurgical wound dressing: Secondary | ICD-10-CM | POA: Diagnosis not present

## 2018-09-03 DIAGNOSIS — H409 Unspecified glaucoma: Secondary | ICD-10-CM | POA: Diagnosis not present

## 2018-09-03 DIAGNOSIS — E1151 Type 2 diabetes mellitus with diabetic peripheral angiopathy without gangrene: Secondary | ICD-10-CM | POA: Diagnosis not present

## 2018-09-03 DIAGNOSIS — Z87891 Personal history of nicotine dependence: Secondary | ICD-10-CM | POA: Diagnosis not present

## 2018-09-03 DIAGNOSIS — Z79891 Long term (current) use of opiate analgesic: Secondary | ICD-10-CM | POA: Diagnosis not present

## 2018-09-03 DIAGNOSIS — K219 Gastro-esophageal reflux disease without esophagitis: Secondary | ICD-10-CM | POA: Diagnosis not present

## 2018-09-03 DIAGNOSIS — Z86711 Personal history of pulmonary embolism: Secondary | ICD-10-CM | POA: Diagnosis not present

## 2018-09-06 DIAGNOSIS — M1990 Unspecified osteoarthritis, unspecified site: Secondary | ICD-10-CM | POA: Diagnosis not present

## 2018-09-06 DIAGNOSIS — T8141XA Infection following a procedure, superficial incisional surgical site, initial encounter: Secondary | ICD-10-CM | POA: Diagnosis not present

## 2018-09-06 DIAGNOSIS — E1151 Type 2 diabetes mellitus with diabetic peripheral angiopathy without gangrene: Secondary | ICD-10-CM | POA: Diagnosis not present

## 2018-09-06 DIAGNOSIS — Z79891 Long term (current) use of opiate analgesic: Secondary | ICD-10-CM | POA: Diagnosis not present

## 2018-09-06 DIAGNOSIS — Z87891 Personal history of nicotine dependence: Secondary | ICD-10-CM | POA: Diagnosis not present

## 2018-09-06 DIAGNOSIS — Z86711 Personal history of pulmonary embolism: Secondary | ICD-10-CM | POA: Diagnosis not present

## 2018-09-06 DIAGNOSIS — T8149XA Infection following a procedure, other surgical site, initial encounter: Secondary | ICD-10-CM | POA: Diagnosis not present

## 2018-09-06 DIAGNOSIS — Z4800 Encounter for change or removal of nonsurgical wound dressing: Secondary | ICD-10-CM | POA: Diagnosis not present

## 2018-09-06 DIAGNOSIS — Z86718 Personal history of other venous thrombosis and embolism: Secondary | ICD-10-CM | POA: Diagnosis not present

## 2018-09-06 DIAGNOSIS — Z7901 Long term (current) use of anticoagulants: Secondary | ICD-10-CM | POA: Diagnosis not present

## 2018-09-06 DIAGNOSIS — I10 Essential (primary) hypertension: Secondary | ICD-10-CM | POA: Diagnosis not present

## 2018-09-06 DIAGNOSIS — K219 Gastro-esophageal reflux disease without esophagitis: Secondary | ICD-10-CM | POA: Diagnosis not present

## 2018-09-06 DIAGNOSIS — H409 Unspecified glaucoma: Secondary | ICD-10-CM | POA: Diagnosis not present

## 2018-09-19 DIAGNOSIS — M545 Low back pain: Secondary | ICD-10-CM | POA: Diagnosis not present

## 2018-09-19 DIAGNOSIS — I739 Peripheral vascular disease, unspecified: Secondary | ICD-10-CM | POA: Diagnosis not present

## 2018-09-19 DIAGNOSIS — I2699 Other pulmonary embolism without acute cor pulmonale: Secondary | ICD-10-CM | POA: Diagnosis not present

## 2018-09-19 DIAGNOSIS — E119 Type 2 diabetes mellitus without complications: Secondary | ICD-10-CM | POA: Diagnosis not present

## 2018-09-19 DIAGNOSIS — I998 Other disorder of circulatory system: Secondary | ICD-10-CM | POA: Diagnosis not present

## 2018-09-19 DIAGNOSIS — K219 Gastro-esophageal reflux disease without esophagitis: Secondary | ICD-10-CM | POA: Diagnosis not present

## 2018-10-12 DIAGNOSIS — I2699 Other pulmonary embolism without acute cor pulmonale: Secondary | ICD-10-CM | POA: Diagnosis not present

## 2018-10-12 DIAGNOSIS — Z87891 Personal history of nicotine dependence: Secondary | ICD-10-CM | POA: Diagnosis not present

## 2018-10-12 DIAGNOSIS — Z86711 Personal history of pulmonary embolism: Secondary | ICD-10-CM | POA: Diagnosis not present

## 2018-10-12 DIAGNOSIS — Z86718 Personal history of other venous thrombosis and embolism: Secondary | ICD-10-CM | POA: Diagnosis not present

## 2018-10-12 DIAGNOSIS — Z79899 Other long term (current) drug therapy: Secondary | ICD-10-CM | POA: Diagnosis not present

## 2018-10-12 DIAGNOSIS — Z5181 Encounter for therapeutic drug level monitoring: Secondary | ICD-10-CM | POA: Diagnosis not present

## 2018-10-12 DIAGNOSIS — M7989 Other specified soft tissue disorders: Secondary | ICD-10-CM | POA: Diagnosis not present

## 2018-10-12 DIAGNOSIS — I2609 Other pulmonary embolism with acute cor pulmonale: Secondary | ICD-10-CM | POA: Diagnosis not present

## 2018-10-12 DIAGNOSIS — J439 Emphysema, unspecified: Secondary | ICD-10-CM | POA: Diagnosis not present

## 2018-10-12 DIAGNOSIS — I2510 Atherosclerotic heart disease of native coronary artery without angina pectoris: Secondary | ICD-10-CM | POA: Diagnosis not present

## 2018-10-15 DIAGNOSIS — I739 Peripheral vascular disease, unspecified: Secondary | ICD-10-CM | POA: Diagnosis not present

## 2018-10-15 DIAGNOSIS — Z87891 Personal history of nicotine dependence: Secondary | ICD-10-CM | POA: Diagnosis not present

## 2018-10-15 DIAGNOSIS — I82411 Acute embolism and thrombosis of right femoral vein: Secondary | ICD-10-CM | POA: Diagnosis not present

## 2018-10-15 DIAGNOSIS — Z7901 Long term (current) use of anticoagulants: Secondary | ICD-10-CM | POA: Diagnosis not present

## 2018-10-15 DIAGNOSIS — T8149XA Infection following a procedure, other surgical site, initial encounter: Secondary | ICD-10-CM | POA: Diagnosis not present

## 2018-10-18 DIAGNOSIS — M792 Neuralgia and neuritis, unspecified: Secondary | ICD-10-CM | POA: Diagnosis not present

## 2018-10-18 DIAGNOSIS — M545 Low back pain: Secondary | ICD-10-CM | POA: Diagnosis not present

## 2018-10-18 DIAGNOSIS — Z6834 Body mass index (BMI) 34.0-34.9, adult: Secondary | ICD-10-CM | POA: Diagnosis not present

## 2018-10-30 DIAGNOSIS — F419 Anxiety disorder, unspecified: Secondary | ICD-10-CM | POA: Diagnosis not present

## 2018-10-30 DIAGNOSIS — Z79899 Other long term (current) drug therapy: Secondary | ICD-10-CM | POA: Diagnosis not present

## 2018-10-30 DIAGNOSIS — R251 Tremor, unspecified: Secondary | ICD-10-CM | POA: Diagnosis not present

## 2018-10-30 DIAGNOSIS — Z6833 Body mass index (BMI) 33.0-33.9, adult: Secondary | ICD-10-CM | POA: Diagnosis not present

## 2018-10-30 DIAGNOSIS — M792 Neuralgia and neuritis, unspecified: Secondary | ICD-10-CM | POA: Diagnosis not present

## 2018-11-07 DIAGNOSIS — I739 Peripheral vascular disease, unspecified: Secondary | ICD-10-CM | POA: Diagnosis not present

## 2018-11-30 DIAGNOSIS — Z6832 Body mass index (BMI) 32.0-32.9, adult: Secondary | ICD-10-CM | POA: Diagnosis not present

## 2018-11-30 DIAGNOSIS — M792 Neuralgia and neuritis, unspecified: Secondary | ICD-10-CM | POA: Diagnosis not present

## 2018-11-30 DIAGNOSIS — Z1331 Encounter for screening for depression: Secondary | ICD-10-CM | POA: Diagnosis not present

## 2018-11-30 DIAGNOSIS — F419 Anxiety disorder, unspecified: Secondary | ICD-10-CM | POA: Diagnosis not present

## 2018-12-05 DIAGNOSIS — I82411 Acute embolism and thrombosis of right femoral vein: Secondary | ICD-10-CM | POA: Diagnosis not present

## 2018-12-05 DIAGNOSIS — I739 Peripheral vascular disease, unspecified: Secondary | ICD-10-CM | POA: Diagnosis not present

## 2018-12-05 DIAGNOSIS — R2241 Localized swelling, mass and lump, right lower limb: Secondary | ICD-10-CM | POA: Diagnosis not present

## 2018-12-11 DIAGNOSIS — Z6832 Body mass index (BMI) 32.0-32.9, adult: Secondary | ICD-10-CM | POA: Diagnosis not present

## 2018-12-11 DIAGNOSIS — N410 Acute prostatitis: Secondary | ICD-10-CM | POA: Diagnosis not present

## 2018-12-12 DIAGNOSIS — M48061 Spinal stenosis, lumbar region without neurogenic claudication: Secondary | ICD-10-CM | POA: Diagnosis not present

## 2018-12-21 DIAGNOSIS — N410 Acute prostatitis: Secondary | ICD-10-CM | POA: Diagnosis not present

## 2018-12-21 DIAGNOSIS — Z6832 Body mass index (BMI) 32.0-32.9, adult: Secondary | ICD-10-CM | POA: Diagnosis not present

## 2019-01-01 DIAGNOSIS — R251 Tremor, unspecified: Secondary | ICD-10-CM | POA: Diagnosis not present

## 2019-01-01 DIAGNOSIS — R51 Headache: Secondary | ICD-10-CM | POA: Diagnosis not present

## 2019-01-01 DIAGNOSIS — I10 Essential (primary) hypertension: Secondary | ICD-10-CM | POA: Diagnosis not present

## 2019-01-01 DIAGNOSIS — F419 Anxiety disorder, unspecified: Secondary | ICD-10-CM | POA: Diagnosis not present

## 2019-01-04 DIAGNOSIS — R2241 Localized swelling, mass and lump, right lower limb: Secondary | ICD-10-CM | POA: Diagnosis not present

## 2019-01-04 DIAGNOSIS — M25551 Pain in right hip: Secondary | ICD-10-CM | POA: Diagnosis not present

## 2019-01-12 DIAGNOSIS — D1621 Benign neoplasm of long bones of right lower limb: Secondary | ICD-10-CM | POA: Insufficient documentation

## 2019-01-22 DIAGNOSIS — Z1331 Encounter for screening for depression: Secondary | ICD-10-CM | POA: Diagnosis not present

## 2019-01-22 DIAGNOSIS — M792 Neuralgia and neuritis, unspecified: Secondary | ICD-10-CM | POA: Diagnosis not present

## 2019-01-22 DIAGNOSIS — I10 Essential (primary) hypertension: Secondary | ICD-10-CM | POA: Diagnosis not present

## 2019-01-22 DIAGNOSIS — F419 Anxiety disorder, unspecified: Secondary | ICD-10-CM | POA: Diagnosis not present

## 2019-01-22 DIAGNOSIS — I739 Peripheral vascular disease, unspecified: Secondary | ICD-10-CM | POA: Diagnosis not present

## 2019-01-28 ENCOUNTER — Other Ambulatory Visit: Payer: Self-pay

## 2019-01-28 ENCOUNTER — Inpatient Hospital Stay
Admit: 2019-01-28 | Discharge: 2019-01-28 | Disposition: A | Discharge status: Home / Self Care | Payer: BLUE CROSS/BLUE SHIELD | Attending: Family Medicine | Admitting: Family Medicine

## 2019-01-28 ENCOUNTER — Encounter: Payer: Self-pay | Admitting: Emergency Medicine

## 2019-01-28 ENCOUNTER — Inpatient Hospital Stay
Admission: EM | Admit: 2019-01-28 | Discharge: 2019-01-30 | DRG: 280 | Disposition: A | Discharge status: Other Acute Inpatient Hospital | Payer: BLUE CROSS/BLUE SHIELD | Attending: Internal Medicine | Admitting: Internal Medicine

## 2019-01-28 ENCOUNTER — Emergency Department: Payer: BLUE CROSS/BLUE SHIELD

## 2019-01-28 DIAGNOSIS — E876 Hypokalemia: Secondary | ICD-10-CM | POA: Diagnosis present

## 2019-01-28 DIAGNOSIS — I10 Essential (primary) hypertension: Secondary | ICD-10-CM | POA: Diagnosis present

## 2019-01-28 DIAGNOSIS — K219 Gastro-esophageal reflux disease without esophagitis: Secondary | ICD-10-CM | POA: Diagnosis not present

## 2019-01-28 DIAGNOSIS — Z7289 Other problems related to lifestyle: Secondary | ICD-10-CM

## 2019-01-28 DIAGNOSIS — Z86718 Personal history of other venous thrombosis and embolism: Secondary | ICD-10-CM | POA: Diagnosis not present

## 2019-01-28 DIAGNOSIS — F1020 Alcohol dependence, uncomplicated: Secondary | ICD-10-CM | POA: Diagnosis present

## 2019-01-28 DIAGNOSIS — Z7901 Long term (current) use of anticoagulants: Secondary | ICD-10-CM

## 2019-01-28 DIAGNOSIS — E785 Hyperlipidemia, unspecified: Secondary | ICD-10-CM | POA: Diagnosis not present

## 2019-01-28 DIAGNOSIS — I700 Atherosclerosis of aorta: Secondary | ICD-10-CM | POA: Diagnosis not present

## 2019-01-28 DIAGNOSIS — H409 Unspecified glaucoma: Secondary | ICD-10-CM | POA: Diagnosis not present

## 2019-01-28 DIAGNOSIS — M1990 Unspecified osteoarthritis, unspecified site: Secondary | ICD-10-CM | POA: Diagnosis not present

## 2019-01-28 DIAGNOSIS — E854 Organ-limited amyloidosis: Secondary | ICD-10-CM | POA: Diagnosis not present

## 2019-01-28 DIAGNOSIS — E782 Mixed hyperlipidemia: Secondary | ICD-10-CM | POA: Diagnosis present

## 2019-01-28 DIAGNOSIS — I472 Ventricular tachycardia: Secondary | ICD-10-CM | POA: Diagnosis present

## 2019-01-28 DIAGNOSIS — Z881 Allergy status to other antibiotic agents status: Secondary | ICD-10-CM

## 2019-01-28 DIAGNOSIS — I2510 Atherosclerotic heart disease of native coronary artery without angina pectoris: Secondary | ICD-10-CM | POA: Diagnosis not present

## 2019-01-28 DIAGNOSIS — I469 Cardiac arrest, cause unspecified: Secondary | ICD-10-CM | POA: Diagnosis not present

## 2019-01-28 DIAGNOSIS — F1099 Alcohol use, unspecified with unspecified alcohol-induced disorder: Secondary | ICD-10-CM | POA: Diagnosis not present

## 2019-01-28 DIAGNOSIS — R079 Chest pain, unspecified: Secondary | ICD-10-CM | POA: Diagnosis not present

## 2019-01-28 DIAGNOSIS — R9431 Abnormal electrocardiogram [ECG] [EKG]: Secondary | ICD-10-CM | POA: Diagnosis not present

## 2019-01-28 DIAGNOSIS — F419 Anxiety disorder, unspecified: Secondary | ICD-10-CM | POA: Diagnosis present

## 2019-01-28 DIAGNOSIS — N400 Enlarged prostate without lower urinary tract symptoms: Secondary | ICD-10-CM | POA: Diagnosis not present

## 2019-01-28 DIAGNOSIS — Z7401 Bed confinement status: Secondary | ICD-10-CM | POA: Diagnosis not present

## 2019-01-28 DIAGNOSIS — N179 Acute kidney failure, unspecified: Secondary | ICD-10-CM | POA: Diagnosis not present

## 2019-01-28 DIAGNOSIS — Z87891 Personal history of nicotine dependence: Secondary | ICD-10-CM | POA: Diagnosis not present

## 2019-01-28 DIAGNOSIS — I442 Atrioventricular block, complete: Secondary | ICD-10-CM | POA: Diagnosis not present

## 2019-01-28 DIAGNOSIS — I680 Cerebral amyloid angiopathy: Secondary | ICD-10-CM | POA: Diagnosis not present

## 2019-01-28 DIAGNOSIS — I5023 Acute on chronic systolic (congestive) heart failure: Secondary | ICD-10-CM | POA: Diagnosis not present

## 2019-01-28 DIAGNOSIS — D62 Acute posthemorrhagic anemia: Secondary | ICD-10-CM | POA: Diagnosis not present

## 2019-01-28 DIAGNOSIS — Z23 Encounter for immunization: Secondary | ICD-10-CM | POA: Diagnosis not present

## 2019-01-28 DIAGNOSIS — I4901 Ventricular fibrillation: Secondary | ICD-10-CM | POA: Diagnosis present

## 2019-01-28 DIAGNOSIS — Z789 Other specified health status: Secondary | ICD-10-CM

## 2019-01-28 DIAGNOSIS — E872 Acidosis: Secondary | ICD-10-CM | POA: Diagnosis not present

## 2019-01-28 DIAGNOSIS — I619 Nontraumatic intracerebral hemorrhage, unspecified: Secondary | ICD-10-CM | POA: Diagnosis not present

## 2019-01-28 DIAGNOSIS — I639 Cerebral infarction, unspecified: Secondary | ICD-10-CM | POA: Diagnosis not present

## 2019-01-28 DIAGNOSIS — I25119 Atherosclerotic heart disease of native coronary artery with unspecified angina pectoris: Secondary | ICD-10-CM | POA: Diagnosis present

## 2019-01-28 DIAGNOSIS — E1151 Type 2 diabetes mellitus with diabetic peripheral angiopathy without gangrene: Secondary | ICD-10-CM | POA: Diagnosis not present

## 2019-01-28 DIAGNOSIS — I214 Non-ST elevation (NSTEMI) myocardial infarction: Secondary | ICD-10-CM | POA: Diagnosis not present

## 2019-01-28 DIAGNOSIS — I110 Hypertensive heart disease with heart failure: Secondary | ICD-10-CM | POA: Diagnosis not present

## 2019-01-28 DIAGNOSIS — Z86711 Personal history of pulmonary embolism: Secondary | ICD-10-CM

## 2019-01-28 HISTORY — DX: Peripheral vascular disease, unspecified: I73.9

## 2019-01-28 HISTORY — DX: Other problems related to lifestyle: Z72.89

## 2019-01-28 HISTORY — DX: Personal history of nicotine dependence: Z87.891

## 2019-01-28 HISTORY — DX: Type 2 diabetes mellitus without complications: E11.9

## 2019-01-28 HISTORY — DX: Alcohol use, unspecified, uncomplicated: F10.90

## 2019-01-28 HISTORY — DX: Other specified health status: Z78.9

## 2019-01-28 HISTORY — DX: Essential (primary) hypertension: I10

## 2019-01-28 HISTORY — DX: Other pulmonary embolism without acute cor pulmonale: I26.99

## 2019-01-28 LAB — COMPREHENSIVE METABOLIC PANEL
ALT: 34 U/L (ref 0–44)
AST: 46 U/L — ABNORMAL HIGH (ref 15–41)
Albumin: 4.1 g/dL (ref 3.5–5.0)
Alkaline Phosphatase: 51 U/L (ref 38–126)
Anion gap: 15 (ref 5–15)
BUN: 18 mg/dL (ref 8–23)
CO2: 22 mmol/L (ref 22–32)
Calcium: 9.1 mg/dL (ref 8.9–10.3)
Chloride: 102 mmol/L (ref 98–111)
Creatinine, Ser: 1.1 mg/dL (ref 0.61–1.24)
GFR calc Af Amer: >60 mL/min (ref >60)
GFR calc non Af Amer: >60 mL/min (ref >60)
Glucose, Bld: 147 mg/dL — ABNORMAL HIGH (ref 70–99)
Potassium: 3 mmol/L — ABNORMAL LOW (ref 3.5–5.1)
Sodium: 139 mmol/L (ref 135–145)
Total Bilirubin: 1.1 mg/dL (ref 0.3–1.2)
Total Protein: 7.5 g/dL (ref 6.5–8.1)

## 2019-01-28 LAB — TROPONIN I
Troponin I: 2.29 ng/mL (ref <0.03)
Troponin I: 3.72 ng/mL (ref <0.03)
Troponin I: 5.22 ng/mL (ref <0.03)
Troponin I: 6.17 ng/mL (ref <0.03)

## 2019-01-28 LAB — GLUCOSE, CAPILLARY
Glucose-Capillary: 106 mg/dL — ABNORMAL HIGH (ref 70–99)
Glucose-Capillary: 112 mg/dL — ABNORMAL HIGH (ref 70–99)
Glucose-Capillary: 123 mg/dL — ABNORMAL HIGH (ref 70–99)
Glucose-Capillary: 137 mg/dL — ABNORMAL HIGH (ref 70–99)
Glucose-Capillary: 168 mg/dL — ABNORMAL HIGH (ref 70–99)

## 2019-01-28 LAB — APTT
aPTT: 31 seconds (ref 24–36)
aPTT: 56 seconds — ABNORMAL HIGH (ref 24–36)
aPTT: 98 seconds — ABNORMAL HIGH (ref 24–36)

## 2019-01-28 LAB — ECHOCARDIOGRAM COMPLETE
Height: 68 in
Weight: 3296 oz

## 2019-01-28 LAB — LIPASE, BLOOD: Lipase: 31 U/L (ref 11–51)

## 2019-01-28 LAB — CBC
HCT: 42.2 % (ref 39.0–52.0)
Hemoglobin: 14.4 g/dL (ref 13.0–17.0)
MCH: 35 pg — ABNORMAL HIGH (ref 26.0–34.0)
MCHC: 34.1 g/dL (ref 30.0–36.0)
MCV: 102.7 fL — ABNORMAL HIGH (ref 80.0–100.0)
Platelets: 134 10*3/uL — ABNORMAL LOW (ref 150–400)
RBC: 4.11 MIL/uL — ABNORMAL LOW (ref 4.22–5.81)
RDW: 12.9 % (ref 11.5–15.5)
WBC: 7.8 10*3/uL (ref 4.0–10.5)
nRBC: 0 % (ref 0.0–0.2)

## 2019-01-28 LAB — MAGNESIUM: Magnesium: 1.7 mg/dL (ref 1.7–2.4)

## 2019-01-28 LAB — TSH: TSH: 1.325 u[IU]/mL (ref 0.350–4.500)

## 2019-01-28 LAB — LIPID PANEL
Cholesterol: 168 mg/dL (ref 0–200)
HDL: 36 mg/dL — ABNORMAL LOW (ref >40)
LDL Cholesterol: 113 mg/dL — ABNORMAL HIGH (ref 0–99)
Total CHOL/HDL Ratio: 4.7 RATIO
Triglycerides: 97 mg/dL (ref <150)
VLDL: 19 mg/dL (ref 0–40)

## 2019-01-28 LAB — PROTIME-INR
INR: 1.3 — ABNORMAL HIGH (ref 0.8–1.2)
Prothrombin Time: 16.1 seconds — ABNORMAL HIGH (ref 11.4–15.2)

## 2019-01-28 LAB — HEPARIN LEVEL (UNFRACTIONATED): Heparin Unfractionated: 3.06 IU/mL — ABNORMAL HIGH (ref 0.30–0.70)

## 2019-01-28 MED ORDER — SUCRALFATE 1 G PO TABS
1.0000 g | ORAL_TABLET | Freq: Three times a day (TID) | ORAL | Status: DC
  Administered 2019-01-28 – 2019-01-30 (×7): 1 g via ORAL
  Filled 2019-01-28 (×7): qty 1

## 2019-01-28 MED ORDER — ASPIRIN EC 81 MG PO TBEC
81.0000 mg | DELAYED_RELEASE_TABLET | Freq: Every day | ORAL | Status: DC
  Administered 2019-01-29: 81 mg via ORAL
  Filled 2019-01-28: qty 1

## 2019-01-28 MED ORDER — NITROGLYCERIN 2 % TD OINT
0.5000 [in_us] | TOPICAL_OINTMENT | Freq: Four times a day (QID) | TRANSDERMAL | Status: DC
  Administered 2019-01-29 (×3): 0.5 [in_us] via TOPICAL
  Filled 2019-01-28 (×3): qty 1

## 2019-01-28 MED ORDER — CLONAZEPAM 0.5 MG PO TABS
0.2500 mg | ORAL_TABLET | Freq: Three times a day (TID) | ORAL | Status: DC | PRN
  Administered 2019-01-29 – 2019-01-30 (×3): 0.5 mg via ORAL
  Filled 2019-01-28 (×6): qty 1

## 2019-01-28 MED ORDER — ASPIRIN 300 MG RE SUPP: 300.0000 mg | RECTAL | Status: AC

## 2019-01-28 MED ORDER — HEPARIN (PORCINE) 25000 UT/250ML-% IV SOLN: 10.0000 [IU]/kg/h | INTRAVENOUS | Status: DC

## 2019-01-28 MED ORDER — NITROGLYCERIN 0.4 MG SL SUBL: 0.4000 mg | SUBLINGUAL_TABLET | SUBLINGUAL | Status: DC | PRN

## 2019-01-28 MED ORDER — ZOLPIDEM TARTRATE 5 MG PO TABS
5.0000 mg | ORAL_TABLET | Freq: Every evening | ORAL | Status: DC | PRN
  Administered 2019-01-29: 5 mg via ORAL
  Filled 2019-01-28: qty 1

## 2019-01-28 MED ORDER — METOPROLOL TARTRATE 25 MG PO TABS
25.0000 mg | ORAL_TABLET | Freq: Two times a day (BID) | ORAL | Status: DC
  Administered 2019-01-28 – 2019-01-29 (×2): 25 mg via ORAL
  Filled 2019-01-28 (×2): qty 1

## 2019-01-28 MED ORDER — HYDROCHLOROTHIAZIDE 25 MG PO TABS
25.0000 mg | ORAL_TABLET | Freq: Every day | ORAL | Status: DC
  Administered 2019-01-29 – 2019-01-30 (×2): 25 mg via ORAL
  Filled 2019-01-28 (×2): qty 1

## 2019-01-28 MED ORDER — POTASSIUM CHLORIDE CRYS ER 20 MEQ PO TBCR
40.0000 meq | EXTENDED_RELEASE_TABLET | Freq: Two times a day (BID) | ORAL | Status: DC
  Administered 2019-01-28 – 2019-01-29 (×2): 40 meq via ORAL
  Filled 2019-01-28 (×2): qty 2

## 2019-01-28 MED ORDER — LOSARTAN POTASSIUM 25 MG PO TABS
100.0000 mg | ORAL_TABLET | Freq: Every day | ORAL | Status: DC
  Administered 2019-01-29 – 2019-01-30 (×2): 100 mg via ORAL
  Filled 2019-01-28 (×2): qty 4

## 2019-01-28 MED ORDER — PANTOPRAZOLE SODIUM 40 MG PO TBEC
40.0000 mg | DELAYED_RELEASE_TABLET | Freq: Every day | ORAL | Status: DC
  Administered 2019-01-28 – 2019-01-30 (×3): 40 mg via ORAL
  Filled 2019-01-28 (×3): qty 1

## 2019-01-28 MED ORDER — LOSARTAN POTASSIUM-HCTZ 100-25 MG PO TABS: 1.0000 | ORAL_TABLET | Freq: Every day | ORAL | Status: DC

## 2019-01-28 MED ORDER — ONDANSETRON HCL 4 MG/2ML IJ SOLN
4.0000 mg | Freq: Four times a day (QID) | INTRAMUSCULAR | Status: DC | PRN
  Administered 2019-01-28: 4 mg via INTRAVENOUS
  Filled 2019-01-28: qty 2

## 2019-01-28 MED ORDER — TAMSULOSIN HCL 0.4 MG PO CAPS
0.4000 mg | ORAL_CAPSULE | Freq: Every day | ORAL | Status: DC
  Administered 2019-01-28 – 2019-01-30 (×3): 0.4 mg via ORAL
  Filled 2019-01-28 (×3): qty 1

## 2019-01-28 MED ORDER — POTASSIUM CHLORIDE CRYS ER 20 MEQ PO TBCR
40.0000 meq | EXTENDED_RELEASE_TABLET | Freq: Once | ORAL | Status: AC
  Administered 2019-01-28: 40 meq via ORAL
  Filled 2019-01-28: qty 2

## 2019-01-28 MED ORDER — DULOXETINE HCL 30 MG PO CPEP
60.0000 mg | ORAL_CAPSULE | Freq: Two times a day (BID) | ORAL | Status: DC
  Administered 2019-01-28 – 2019-01-30 (×4): 60 mg via ORAL
  Filled 2019-01-28: qty 2
  Filled 2019-01-28 (×2): qty 1
  Filled 2019-01-28: qty 2
  Filled 2019-01-28 (×2): qty 1
  Filled 2019-01-28: qty 2
  Filled 2019-01-28: qty 1

## 2019-01-28 MED ORDER — FAMOTIDINE 20 MG PO TABS
40.0000 mg | ORAL_TABLET | Freq: Every morning | ORAL | Status: DC
  Administered 2019-01-28 – 2019-01-30 (×3): 40 mg via ORAL
  Filled 2019-01-28 (×3): qty 2

## 2019-01-28 MED ORDER — MAGNESIUM SULFATE 2 GM/50ML IV SOLN: 2.0000 g | Freq: Once | INTRAVENOUS | Status: DC

## 2019-01-28 MED ORDER — PNEUMOCOCCAL VAC POLYVALENT 25 MCG/0.5ML IJ INJ
0.5000 mL | INJECTION | INTRAMUSCULAR | Status: AC
  Administered 2019-01-29: 09:00:00 0.5 mL via INTRAMUSCULAR
  Filled 2019-01-28: qty 0.5

## 2019-01-28 MED ORDER — ACETAMINOPHEN 325 MG PO TABS
650.0000 mg | ORAL_TABLET | ORAL | Status: DC | PRN
  Administered 2019-01-28 – 2019-01-30 (×3): 650 mg via ORAL
  Filled 2019-01-28 (×2): qty 2

## 2019-01-28 MED ORDER — SODIUM CHLORIDE 0.9 % IV SOLN: INTRAVENOUS | Status: DC

## 2019-01-28 MED ORDER — HEPARIN BOLUS VIA INFUSION
4000.0000 [IU] | Freq: Once | INTRAVENOUS | Status: DC
  Filled 2019-01-28: qty 4000

## 2019-01-28 MED ORDER — HYDROCODONE-ACETAMINOPHEN 5-325 MG PO TABS: 1.0000 | ORAL_TABLET | ORAL | Status: DC | PRN

## 2019-01-28 MED ORDER — ASPIRIN 81 MG PO CHEW: 324.0000 mg | CHEWABLE_TABLET | ORAL | Status: AC

## 2019-01-28 MED ORDER — INSULIN ASPART 100 UNIT/ML ~~LOC~~ SOLN
0.0000 [IU] | SUBCUTANEOUS | Status: DC
  Administered 2019-01-28 – 2019-01-30 (×5): 1 [IU] via SUBCUTANEOUS
  Filled 2019-01-28 (×5): qty 1

## 2019-01-28 MED ORDER — HEPARIN (PORCINE) 25000 UT/250ML-% IV SOLN
750.0000 [IU]/h | INTRAVENOUS | Status: DC
  Administered 2019-01-28 – 2019-01-29 (×2): 1250 [IU]/h via INTRAVENOUS
  Administered 2019-01-30: 750 [IU]/h via INTRAVENOUS
  Filled 2019-01-28 (×2): qty 250

## 2019-01-28 MED ORDER — HEPARIN (PORCINE) 25000 UT/250ML-% IV SOLN: 12.0000 [IU]/kg/h | INTRAVENOUS | Status: DC

## 2019-01-28 MED ORDER — HEPARIN (PORCINE) 25000 UT/250ML-% IV SOLN
1100.0000 [IU]/h | INTRAVENOUS | Status: DC
  Administered 2019-01-28 (×2): 1100 [IU]/h via INTRAVENOUS
  Filled 2019-01-28: qty 250

## 2019-01-28 NOTE — ED Notes (Signed)
Pt was shocked , NSR returned. PT awake and alert. PT diaphoretic, placed on non rebreather.

## 2019-01-28 NOTE — ED Notes (Signed)
Date and time results received: 01/28/19 0524 (use smartphrase ".now" to insert current time)  Test: Troponin Critical Value: 2.29  Name of Provider Notified: Dr. Hinda Kehr MD  Orders Received? Or Actions Taken?: None at this time

## 2019-01-28 NOTE — Progress Notes (Addendum)
ANTICOAGULATION CONSULT NOTE - Initial Consult  Pharmacy Consult for heparin Indication: chest pain/ACS  Allergies  Allergen Reactions  . Amoxicillin Other (See Comments)    Abdominal cramps    Patient Measurements: Height: 5\' 8"  (172.7 cm) Weight: 206 lb (93.4 kg) IBW/kg (Calculated) : 68.4 Heparin Dosing Weight: 87.9 kg  Vital Signs: Temp: 98.4 F (36.9 C) (04/13 0317) Temp Source: Oral (04/13 0317) BP: 124/108 (04/13 0530) Pulse Rate: 92 (04/13 0530)  Labs: Recent Labs    01/28/19 0331 01/28/19 0426 01/28/19 0427  HGB 14.4  --   --   HCT 42.2  --   --   PLT 134*  --   --   APTT  --  31  --   LABPROT  --  16.1*  --   INR  --  1.3*  --   CREATININE 1.10  --   --   TROPONINI  --   --  2.29*    Estimated Creatinine Clearance: 74.2 mL/min (by C-G formula based on SCr of 1.1 mg/dL).   Medical History: Past Medical History:  Diagnosis Date  . Alcohol use    "cutting back" but still heavy and daily, multiple shots of bourbon each night  . Diabetes mellitus without complication ([place])   . History of tobacco use    reportedly quit in 2013  . Hypertension   . Pulmonary embolism ([place]) 2012   unprovoked  . PVD (peripheral vascular disease) ([facility])     Medications:  Scheduled:    Assessment: Patient arrives to ED for CP radiating down L arm after being awoken from sleep. Initial trop on arrival is 2.29. 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 EKG showing slight ST elevation in leads aVR, V1 and 2, w/ possible ST depression on lead II. Patient is being started on heparin drip for NSTEMI (maybe STEMI per EKG??)  Goal of Therapy:  Heparin level 0.3-0.7 units/ml Monitor platelets by anticoagulation protocol: Yes   Plan:  Will omit bolus since patient has been on eliquis with last dose unknown. Will start rate at 1100  units/hr Baseline labs drawn all WNL except PT/INR slightly elevated at 16.1/1.3. Baseline heparin level 3.06, will dose off of aPTT for now and will switch to dosing to heparin level once both levels correlate. Will continue to monitor daily CBC's and adjust levels per aPTTs for now.  Tobie Lords, PharmD, BCPS Clinical Pharmacist 01/28/2019

## 2019-01-28 NOTE — ED Notes (Signed)
Pt gave verbal consent for pharmacy tech to speak with wife to get med rec done.

## 2019-01-28 NOTE — ED Provider Notes (Signed)
Patient was being transported to a bed in the hospital when he went to V. fib arrest.  Patient was emergently brought back to the ER, defibrillated with 200 J.  Patient had return of spontaneous circulation.  He has been given IV amiodarone.  I will contact cardiology for emergent heart catheterization.   Earleen Newport, MD 01/28/19 1125

## 2019-01-28 NOTE — ED Notes (Signed)
PT was trasnported by this RN to the floor, pt c/o dizziness and became snoring respirations and became v-dib. PT was rushed back to ED and this RN started CPR and MD at bedside.

## 2019-01-28 NOTE — ED Provider Notes (Signed)
[facility] Emergency Department Provider Note  ____________________________________________   First MD Initiated Contact with Patient 01/28/19 [phone]     (approximate)  I have reviewed the triage vital signs and the nursing notes.   HISTORY  Chief Complaint Chest Pain    HPI Damon English is a 68 y.o. male with medical history as listed below   and who has not ever had a cardiac work-up in spite of his peripheral vascular disease.  He presents by EMS tonight for evaluation of chest pain.  He says that he has been having intermittent sharp chest pains for the last couple of weeks.  He is seen his primary care doctor a couple of times and they adjusted his reflux medicine thinking it might be that, but it does not seem to have helped.  He said that a week ago he had an episode of sharp pain that woke him up from sleep but it was very brief and he talk to his doctor about it.  His doctor told him that if it happened again he should come to the emergency department.  Tonight at about 12:30 AM he woke from sleep with severe stabbing anterior left-sided pain radiating to his left arm.  He has not ever experienced anything like that and it lasted at least 30 minutes before getting better.  Nothing in particular made it better or worse.  He was not short of breath and he denied nausea and vomiting.  He is been isolating himself and has had no signs or symptoms of COVID-19 including cough, sore throat, nasal congestion, etc., and he has not been around anyone or had any concerning travel.  He has a history of severe vascular disease and is on Eliquis after having a PE and apparently a DVT in the right lower extremity requiring thrombectomy and bypass in the right lower extremity.  He denies ever having a cardiac work-up.  He does not regularly take aspirin but he was given a full dose aspirin by EMS on the way to the emergency department.  His chest pain has almost completely  gone away now and he says it is about a 3 but it was a 10 previously.        Past Medical History:  Diagnosis Date   Alcohol use    "cutting back" but still heavy and daily, multiple shots of bourbon each night   Diabetes mellitus without complication ([place])    History of tobacco use    reportedly quit in 2013   Hypertension    Pulmonary embolism ([place]) 2012   unprovoked   PVD (peripheral vascular disease) ([place])     There are no active problems to display for this patient.   Past Surgical History:  Procedure Laterality Date   BACK SURGERY     CHOLECYSTECTOMY     ERCP W/ SPHICTEROTOMY  2015   FEMORAL ARTERY - POPLITEAL ARTERY BYPASS GRAFT Right 06/19/2018   THROMBOENDARTERECTOMY Right 06/19/2018   TONSILLECTOMY     VASCULAR SURGERY      Prior to Admission medications   Not on File    Allergies Amoxicillin  History reviewed. No pertinent family history.  Social History Social History   Tobacco Use   Smoking status: Former Smoker   Smokeless tobacco: Never Used  Substance Use Topics   Alcohol use: Yes    Alcohol/week: 7.0 standard drinks    Types: 7 Shots of liquor per week   Drug use: Never  Review of Systems Constitutional: No fever/chills Eyes: No visual changes. ENT: No sore throat. Cardiovascular: +chest pain. Respiratory: Denies shortness of breath. Gastrointestinal: No abdominal pain.  No nausea, no vomiting.  No diarrhea.  No constipation. Genitourinary: Negative for dysuria. Musculoskeletal: Negative for neck pain.  Negative for back pain. Integumentary: Negative for rash. Neurological: Negative for headaches, focal weakness or numbness.   ____________________________________________   PHYSICAL EXAM:  VITAL SIGNS: ED Triage Vitals  Enc Vitals Group     BP 01/28/19 0317 (!) 152/78     Pulse Rate 01/28/19 0317 (!) 112     Resp 01/28/19 0317 (!) 26     Temp 01/28/19 0317 98.4 F (36.9 C)     Temp Source 01/28/19 0317  Oral     SpO2 01/28/19 0317 99 %     Weight 01/28/19 0320 93.4 kg (206 lb)     Height 01/28/19 0320 1.727 m (5\' 8" )     Head Circumference --      Peak Flow --      Pain Score 01/28/19 0319 3     Pain Loc --      Pain Edu? --      Excl. in [place]? --     Constitutional: Alert and oriented. Well appearing and in no acute distress. Eyes: Conjunctivae are normal.  Head: Atraumatic. Nose: No congestion/rhinnorhea. Mouth/Throat: Mucous membranes are moist. Neck: No stridor.  No meningeal signs.   Cardiovascular: Normal rate, regular rhythm. Good peripheral circulation. Grossly normal heart sounds. Respiratory: Normal respiratory effort.  No retractions. Lungs CTAB. Gastrointestinal: Soft and nontender. No distention.  Musculoskeletal: No lower extremity tenderness nor edema. No gross deformities of extremities. Neurologic:  Normal speech and language. No gross focal neurologic deficits are appreciated.  Skin:  Skin is warm, dry and intact. No rash noted.  Well-healed surgical scar in the right leg. Psychiatric: Mood and affect are normal. Speech and behavior are normal.  ____________________________________________   LABS (all labs ordered are listed, but only abnormal results are displayed)  Labs Reviewed  CBC - Abnormal; Notable for the following components:      Result Value   RBC 4.11 (*)    MCV 102.7 (*)    MCH 35.0 (*)    Platelets 134 (*)    All other components within normal limits  COMPREHENSIVE METABOLIC PANEL - Abnormal; Notable for the following components:   Potassium 3.0 (*)    Glucose, Bld 147 (*)    AST 46 (*)    All other components within normal limits  PROTIME-INR - Abnormal; Notable for the following components:   Prothrombin Time 16.1 (*)    INR 1.3 (*)    All other components within normal limits  TROPONIN I - Abnormal; Notable for the following components:   Troponin I 2.29 (*)    All other components within normal limits  LIPASE, BLOOD  MAGNESIUM    APTT  HEPARIN LEVEL (UNFRACTIONATED)   ____________________________________________  EKG  ED ECG REPORT I, Hinda Kehr, the attending physician, personally viewed and interpreted this ECG.  Date: 01/28/2019 EKG Time: 3:18 AM Rate: 106 Rhythm: Mild sinus tachycardia QRS Axis: normal Intervals: normal ST/T Wave abnormalities: The patient has about a millimeter of ST depression in leads I, II, V5, and V6, with some abnormal appearing ST segments and T waves in leads II and III. Narrative Interpretation: Does not meet STEMI criteria but is concerning for some mild ischemia.   ____________________________________________  RADIOLOGY I, Hinda Kehr, personally  viewed and evaluated these images (plain radiographs) as part of my medical decision making, as well as reviewing the written report by the radiologist.  ED MD interpretation:  No acute abnormalities on CXR  Official radiology report(s): Dg Chest Port 1 View  Result Date: 01/28/2019 CLINICAL DATA:  68 y/o M; sharp anterior chest pain radiating down the left arm. EXAM: PORTABLE CHEST 1 VIEW COMPARISON:  11/08/2013 chest radiograph. FINDINGS: Stable cardiac silhouette within normal limits given projection and technique. Aortic atherosclerosis with calcification. Clear lungs. No pleural effusion or pneumothorax. No acute osseous abnormality is evident. IMPRESSION: No acute pulmonary process identified. Aortic Atherosclerosis (ICD10-I70.0). Electronically Signed   By: Kristine Garbe M.D.   On: 01/28/2019 03:42    ____________________________________________   PROCEDURES   Procedure(s) performed (including Critical Care):  .Critical Care Performed by: Hinda Kehr, MD Authorized by: Hinda Kehr, MD   Critical care provider statement:    Critical care time (minutes):  30   Critical care time was exclusive of:  Separately billable procedures and treating other patients   Critical care was necessary to treat or  prevent imminent or life-threatening deterioration of the following conditions: NSTEMI requiring heparin.   Critical care was time spent personally by me on the following activities:  Development of treatment plan with patient or surrogate, discussions with consultants, evaluation of patient's response to treatment, examination of patient, obtaining history from patient or surrogate, ordering and performing treatments and interventions, ordering and review of laboratory studies, ordering and review of radiographic studies, pulse oximetry, re-evaluation of patient's condition and review of old charts     ____________________________________________   [place] / MDM / [place] / ED COURSE  As part of my medical decision making, I reviewed the following data within the [place] notes reviewed and incorporated, Labs reviewed , EKG interpreted , Old chart reviewed, Radiograph reviewed , Discussed with admitting physician , A consult was requested and obtained from this/these consultant(s) Cardiology and Notes from prior ED visits  TYSHAN ENDERLE was evaluated in Emergency Department on 01/28/2019 for the symptoms described in the history of present illness. He was evaluated in the context of the global COVID-19 pandemic, which necessitated consideration that the patient might be at risk for infection with the SARS-CoV-2 virus that causes COVID-19. Institutional protocols and algorithms that pertain to the evaluation of patients at risk for COVID-19 are in a state of rapid change based on information released by regulatory bodies including the CDC and federal and state organizations. These policies and algorithms were followed during the patient's care in the ED.      Differential diagnosis includes, but is not limited to, ACS, PE, pneumonia, musculoskeletal pain, acid reflux, aortic dissection.  The patient is hypertensive but he has hypertension at  baseline.  He has had no neurological symptoms that might be a more suspicious for aortic dissection, as well as the fact that his pain is improved significantly.  I think that he has been having unstable angina for about a week and he had his most severe episode tonight.  He is already on Eliquis and he has taken a full dose aspirin in route to the hospital as an antiplatelet agent.  His pain is much improved I will hold off on giving him any additional treatment at this time.  Lab work is pending including coagulation studies and troponin.  His EKG is concerning for some mild ischemia but does not meet STEMI criteria.  Pulmonary embolism is certainly possible but I think unlikely given that he is on Eliquis already and it is much more likely that he is having chest pain secondary to unstable angina/cardiac ischemia given his vascular history, history of smoking, history of alcohol use, hypertension, diabetes, etc.  No indication for imaging at this time but I anticipate he will need admission, likely after starting him on a heparin infusion but without a bolus given his anticoagulation on Eliquis.  Clinical Course as of Jan 27 553  Mon Jan 28, 2019  5956 No acute abnormalities on CXR.  DG Chest Port 1 View [CF]  [phone] CBC within normal limits.  Troponin still pending; called and spoke with Murray Hodgkins at the lab and she said the initial test did not work because the quantity was insufficient, but they are running it again, and we should have a result in about 15 minutes.   [CF]  6433 Ordering potassium 40 meq PO  Potassium(!): 3.0 [CF]  0524 Magnesium: 1.7 [CF]  0526 Significantly elevated troponin consistent with NSTEMI.  Calling Dr. Nehemiah Massed to discuss given the patient's chronic Eliquis, but anticipate starting heparin and admission.  Troponin I(!!): 2.29 [CF]  0531 I updated the patient and he understands the plan.  He continues to be chest-pain free.  Awaiting callback from Dr. Nehemiah Massed.   [CF]  [phone]  I discussed the case by phone with Dr. Nehemiah Massed.  He agreed with my plan for heparin infusion with no bolus and agreed that keeping the patient n.p.o. would be appropriate.  He will see the patient in the hospital this morning.  He did not recommend any other intervention at this time.  I have paged the hospitalist for admission.   [CF]  [phone] Discussed by phone with Dr. Sidney Ace with the hospitalist service.  We discussed the plan in detail and he will admit.   [CF]    Clinical Course User Index [CF] Hinda Kehr, MD    ____________________________________________  FINAL CLINICAL IMPRESSION(S) / ED DIAGNOSES  Final diagnoses:  NSTEMI (non-ST elevated myocardial infarction) ([place])  Hypokalemia  Current use of long term anticoagulation  Alcohol use     MEDICATIONS GIVEN DURING THIS VISIT:  Medications  heparin ADULT infusion 100 units/mL (25000 units/219mL sodium chloride 0.45%) (has no administration in time range)  potassium chloride SA (K-DUR,KLOR-CON) CR tablet 40 mEq (40 mEq Oral Given 01/28/19 0533)     ED Discharge Orders    None       Note:  This document was prepared using Dragon voice recognition software and may include unintentional dictation errors.   Hinda Kehr, MD 01/28/19 [phone]

## 2019-01-28 NOTE — ED Notes (Signed)
300mg  Amiodarone given at this time

## 2019-01-28 NOTE — Progress Notes (Signed)
ANTICOAGULATION CONSULT NOTE - Initial Consult  Pharmacy Consult for heparin Indication: chest pain/ACS  Allergies  Allergen Reactions  . Amoxicillin Other (See Comments)    Abdominal cramps    Patient Measurements: Height: 5\' 8"  (172.7 cm) Weight: 206 lb (93.4 kg) IBW/kg (Calculated) : 68.4 Heparin Dosing Weight: 87.9 kg  Vital Signs: Temp: 97.4 F (36.3 C) (04/13 1252) Temp Source: Oral (04/13 1252) BP: 103/61 (04/13 1252) Pulse Rate: 93 (04/13 1252)  Labs: Recent Labs    01/28/19 0331 01/28/19 0426 01/28/19 0427 01/28/19 1127 01/28/19 1305  HGB 14.4  --   --   --   --   HCT 42.2  --   --   --   --   PLT 134*  --   --   --   --   APTT  --  31  --   --  56*  LABPROT  --  16.1*  --   --   --   INR  --  1.3*  --   --   --   HEPARINUNFRC  --  3.06*  --   --   --   CREATININE 1.10  --   --   --   --   TROPONINI  --   --  2.29* 3.72*  --     Estimated Creatinine Clearance: 74.2 mL/min (by C-G formula based on SCr of 1.1 mg/dL).   Medical History: Past Medical History:  Diagnosis Date  . Alcohol use    "cutting back" but still heavy and daily, multiple shots of bourbon each night  . Diabetes mellitus without complication ([place])   . History of tobacco use    reportedly quit in 2013  . Hypertension   . Pulmonary embolism ([place]) 2012   unprovoked  . PVD (peripheral vascular disease) ([facility])     Medications:  Scheduled:  . aspirin  324 mg Oral NOW   Or  . aspirin  300 mg Rectal NOW  . [START ON 01/29/2019] aspirin EC  81 mg Oral Daily  . DULoxetine  60 mg Oral BID  . famotidine  40 mg Oral q morning - 10a  . losartan  100 mg Oral Daily   And  . hydrochlorothiazide  25 mg Oral Daily  . insulin aspart  0-9 Units Subcutaneous Q4H  . metoprolol tartrate  25 mg Oral BID  . nitroGLYCERIN  0.5 inch Topical Q6H  . pantoprazole  40 mg Oral Daily  . [START ON 01/29/2019] pneumococcal 23 valent vaccine  0.5 mL Intramuscular Tomorrow-1000  . potassium chloride  40 mEq  Oral BID  . sucralfate  1 g Oral TID AC & HS  . tamsulosin  0.4 mg Oral Daily    Assessment: Patient arrives to ED for CP radiating down L arm after being awoken from sleep. Initial trop on arrival is 2.29. 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 Patient is being started on heparin drip for NSTEMI   Goal of Therapy:  Heparin level 0.3-0.7 units/ml Monitor platelets by anticoagulation protocol: Yes  APTT goal: 66-102 seconds   Plan:  4/13 aPTT @ 1305 = 56 Will dose off of aPTT for now and will switch to dosing to heparin level once both levels correlate. Increase heparin drip to 1250 units/hr. Will continue to monitor daily CBC's and adjust levels per  aPTTs for now.   Paticia Stack, PharmD Pharmacy Resident  01/28/2019 2:46 PM

## 2019-01-28 NOTE — ED Notes (Signed)
Pt requesting transfer to [facility] at this time, Admitting MD notified at this time

## 2019-01-28 NOTE — Progress Notes (Signed)
Spoke to [facility] transfer center on pt's request. Dr.mark Tamera Punt called back and I explained pt's details. They will call back with confirmation regarding transfer.

## 2019-01-28 NOTE — Consult Note (Signed)
[facility] Consultation Note  Patient ID: Damon English, MRN: [id number], DOB/AGE: 1951/09/29 68 y.o. Admit date: 01/28/2019   Date of Consult: 01/28/2019 Primary Physician: [name], PA-C Primary Cardiologist: None  Chief Complaint:  Chief Complaint  Patient presents with  . Chest Pain   Reason for Consult: Chest pain with non-ST elevation myocardial infarction  HPI: 68 y.o. male with known significant tobacco and alcohol use essential hypertension mixed hyperlipidemia diabetes previously on a reasonable medication management with some significant peripheral vascular disease.  The patient has had significant right leg vascular abnormalities status post apparent femoropopliteal bypass surgery.  The patient has done reasonably well but is not very active at all.  In the recent weeks he has had episodes of chest discomfort radiating into his back also consistent with possible concerns for gastroesophageal reflux.  Reflux medication did not help with this issue and then he had continued to resting chest discomfort.  When seen in the emergency room he had significant normal sinus rhythm with lateral ST depression consistent with possible anterior myocardial ischemia and infarct.  Troponin level was 2.2.  Hypertension was significant and the patient continued to have improvements with nitrates.  Now he is fully relieved of chest discomfort and shortness of breath and significantly improved at this time.  He is hemodynamically stable  Past Medical History:  Diagnosis Date  . Alcohol use    "cutting back" but still heavy and daily, multiple shots of bourbon each night  . Diabetes mellitus without complication ([place])   . History of tobacco use    reportedly quit in 2013  . Hypertension   . Pulmonary embolism ([place]) 2012   unprovoked  . PVD (peripheral vascular disease) [facility])       Surgical History:  Past Surgical History:  Procedure Laterality Date  . BACK SURGERY    .  CHOLECYSTECTOMY    . ERCP W/ SPHICTEROTOMY  2015  . FEMORAL ARTERY - POPLITEAL ARTERY BYPASS GRAFT Right 06/19/2018  . THROMBOENDARTERECTOMY Right 06/19/2018  . TONSILLECTOMY    . VASCULAR SURGERY       Home Meds: Prior to Admission medications   Medication Sig Start Date End Date Taking? Authorizing Provider  ELIQUIS 5 MG TABS tablet Take 1 tablet by mouth 2 (two) times daily. 12/30/18  Yes [provider]  esomeprazole (NEXIUM) 40 MG capsule Take 1 capsule by mouth daily. 12/03/18  Yes [provider]  famotidine (PEPCID) 40 MG tablet Take 1 tablet by mouth every morning. 01/21/19  Yes [provider]  HYDROcodone-acetaminophen (NORCO/VICODIN) 5-325 MG tablet Take 0.5-1 tablets by mouth 2 (two) times daily as needed for pain. 01/22/19  Yes [provider]  losartan-hydrochlorothiazide (HYZAAR) 100-25 MG tablet Take 0.5 tablets by mouth 2 (two) times daily.  01/19/19  Yes [provider]  metoprolol succinate (TOPROL-XL) 25 MG 24 hr tablet Take 1 tablet by mouth daily. 11/30/18  Yes [provider]  sucralfate (CARAFATE) 1 g tablet Take 1 tablet by mouth 4 (four) times daily -  before meals and at bedtime. 01/23/19  Yes [provider]    Inpatient Medications:  . aspirin  324 mg Oral NOW   Or  . aspirin  300 mg Rectal NOW  . [START ON 01/29/2019] aspirin EC  81 mg Oral Daily  . DULoxetine  60 mg Oral BID  . famotidine  40 mg Oral q morning - 10a  . insulin aspart  0-9 Units Subcutaneous Q4H  .  losartan-hydrochlorothiazide  1 tablet Oral Daily  . metoprolol tartrate  25 mg Oral BID  . nitroGLYCERIN  0.5 inch Topical Q6H  . pantoprazole  40 mg Oral Daily  . potassium chloride  40 mEq Oral BID  . sucralfate  1 g Oral TID AC & HS  . tamsulosin  0.4 mg Oral Daily   . sodium chloride    . heparin 1,100 Units/hr (01/28/19 6063)  . magnesium sulfate 1 - 4 g bolus IVPB      Allergies:  Allergies  Allergen Reactions  . Amoxicillin  Other (See Comments)    Abdominal cramps    Social History   Socioeconomic History  . Marital status: Married    Spouse name: Not on file  . Number of children: Not on file  . Years of education: Not on file  . Highest education level: Not on file  Occupational History  . Not on file  Social Needs  . Financial resource strain: Not on file  . Food insecurity:    Worry: Not on file    Inability: Not on file  . Transportation needs:    Medical: Not on file    Non-medical: Not on file  Tobacco Use  . Smoking status: Former Research scientist (life sciences)  . Smokeless tobacco: Never Used  Substance and Sexual Activity  . Alcohol use: Yes    Alcohol/week: 7.0 standard drinks    Types: 7 Shots of liquor per week  . Drug use: Never  . Sexual activity: Not on file  Lifestyle  . Physical activity:    Days per week: Not on file    Minutes per session: Not on file  . Stress: Not on file  Relationships  . Social connections:    Talks on phone: Not on file    Gets together: Not on file    Attends religious service: Not on file    Active member of club or organization: Not on file    Attends meetings of clubs or organizations: Not on file    Relationship status: Not on file  . Intimate partner violence:    Fear of current or ex partner: Not on file    Emotionally abused: Not on file    Physically abused: Not on file    Forced sexual activity: Not on file  Other Topics Concern  . Not on file  Social History Narrative  . Not on file     History reviewed. No pertinent family history.   Review of Systems Positive for chest pain shortness of breath Negative for: General:  chills, fever, night sweats or weight changes.  Cardiovascular: PND orthopnea syncope dizziness  Dermatological skin lesions rashes Respiratory: Cough congestion Urologic: Frequent urination urination at night and hematuria Abdominal: negative for nausea, vomiting, diarrhea, bright red blood per rectum, melena, or  hematemesis Neurologic: negative for visual changes, and/or hearing changes  All other systems reviewed and are otherwise negative except as noted above.  Labs: Recent Labs    01/28/19 0427  TROPONINI 2.29*   Lab Results  Component Value Date   WBC 7.8 01/28/2019   HGB 14.4 01/28/2019   HCT 42.2 01/28/2019   MCV 102.7 (H) 01/28/2019   PLT 134 (L) 01/28/2019    Recent Labs  Lab 01/28/19 0331  NA 139  K 3.0*  CL 102  CO2 22  BUN 18  CREATININE 1.10  CALCIUM 9.1  PROT 7.5  BILITOT 1.1  ALKPHOS 51  ALT 34  AST 46*  GLUCOSE  147*   No results found for: CHOL, HDL, LDLCALC, TRIG No results found for: DDIMER  Radiology/Studies:  Dg Chest Port 1 View  Result Date: 01/28/2019 CLINICAL DATA:  68 y/o M; sharp anterior chest pain radiating down the left arm. EXAM: PORTABLE CHEST 1 VIEW COMPARISON:  11/08/2013 chest radiograph. FINDINGS: Stable cardiac silhouette within normal limits given projection and technique. Aortic atherosclerosis with calcification. Clear lungs. No pleural effusion or pneumothorax. No acute osseous abnormality is evident. IMPRESSION: No acute pulmonary process identified. Aortic Atherosclerosis (ICD10-I70.0). Electronically Signed   By: Kristine Garbe M.D.   On: 01/28/2019 03:42    EKG: Normal sinus rhythm with interventricular conduction defect with lateral ST depression  Weights: Filed Weights   01/28/19 0320  Weight: 93.4 kg     Physical Exam: Blood pressure (!) 151/86, pulse 93, temperature 98.4 F (36.9 C), temperature source Oral, resp. rate 19, height 5\' 8"  (1.727 m), weight 93.4 kg, SpO2 99 %. Body mass index is 31.32 kg/m. General: Well developed, well nourished, in no acute distress. Head eyes ears nose throat: Normocephalic, atraumatic, sclera non-icteric, no xanthomas, nares are without discharge. No apparent thyromegaly and/or mass  Lungs: Normal respiratory effort.  no wheezes, no rales, no rhonchi.  Heart: RRR with normal  S1 S2. no murmur gallop, no rub, PMI is normal size and placement, carotid upstroke normal without bruit, jugular venous pressure is normal Abdomen: Soft, non-tender, non-distended with normoactive bowel sounds. No hepatomegaly. No rebound/guarding. No obvious abdominal masses. Abdominal aorta is normal size without bruit Extremities: No edema. no cyanosis, no clubbing, no ulcers  Peripheral : 2+ bilateral upper extremity pulses, 2+ bilateral femoral pulses, 1+ bilateral dorsal pedal pulse Neuro: Alert and oriented. No facial asymmetry. No focal deficit. Moves all extremities spontaneously. Musculoskeletal: Normal muscle tone without kyphosis Psych:  Responds to questions appropriately with a normal affect.    Assessment: 68 year old male with hypertension hyperlipidemia diabetes tobacco alcohol use and peripheral vascular disease with acute non-ST elevation myocardial infarction  Plan: 1.  Continue nitrates for non-ST elevation myocardial infarction and chest discomfort 2.  Heparin for further risk reduction cardiovascular event 3.  Beta-blocker ACE inhibitor for non-ST elevation microinfarction as able 4.  Echocardiogram for LV systolic dysfunction valvular heart disease contributing to above 5.  Proceed to cardiac catheterization to assess coronary anatomy and further treatment thereof is necessary.  Patient understands the risk and benefits of cardiac catheterization.  This includes a possibility of death stroke heart attack infection bleeding or blood clot.  He is at low risk for conscious sedation  Signed, Corey Skains M.D. [facility] 01/28/2019, 8:12 AM

## 2019-01-28 NOTE — H&P (Addendum)
[place] [place] NAME: Damon English    MR#:  [id number]  DATE OF BIRTH:  Dec 23, 1950  DATE OF ADMISSION:  01/28/2019  PRIMARY CARE PHYSICIAN: [name], PA-C   REQUESTING/REFERRING PHYSICIAN: Hinda Kehr, MD  CHIEF COMPLAINT:   Chief Complaint  Patient presents with   Chest Pain    HISTORY OF PRESENT ILLNESS:  Damon English  is a 68 y.o. Caucasian male with a known history of multiple medical problems that will be mentioned below, who presented to the emergency room with acute onset of midsternal chest pain felt as pressure and sharp pain that woke him up at 12:30 AM with radiation to his left arm.  He had similar pain about a week ago that lasted about 5 to 10 minutes.  He stated that he would take Tums and breathe deep and it would resolve.  Tonight he was given 4 baby aspirin by EMS and had taken 1 Norco prior to leaving home.  He was pain-free by the time he came to the ER.  He denied any nausea or vomiting or abdominal pain.  No bleeding diathesis.  He has chronic right leg swelling with history of peripheral vascular disease status post femoral-popliteal bypass.  He also has chronic right foot numbness.  No dysuria, oliguria or hematuria or flank pain.  No cough or wheezing or hemoptysis.  No recent sick exposure or travel outside of the state or the country.  Upon presentation to the emergency room, blood pressure was 152/78 with a pulse of 112, respiratory to 26 with a pulse 79 9% on room air and temperature 98.4.  EKG showed sinus tachycardia with rate 106 with Q waves in V1 and V2 and ST segment depression that is about 1 mm laterally in V4 through V6 as well as 1 and aVL.  Labs were remarkable for hypokalemia of 3 mild hypomagnesemia of 1.7 with anion gap of 15 glucose of 147.  Troponin I was elevated at 2.29 from first set and CBC showed macrocytosis.  INR was 1.3 and PTT 16.1.  Portable chest x-ray showed no acute  cardiopulmonary disease.  It did show aortic atherosclerosis.  The patient was started on IV heparin and was given 40 mEq p.o. potassium chloride.  Dr. Nehemiah Massed was notified about the patient.  The patient will be admitted to a telemetry bed for further evaluation and management. PAST MEDICAL HISTORY:   Past Medical History:  Diagnosis Date   Alcohol use    "cutting back" but still heavy and daily, multiple shots of bourbon each night   Diabetes mellitus without complication ([place])    History of tobacco use    reportedly quit in 2013   Hypertension    Pulmonary embolism ([place]) 2012   unprovoked   PVD (peripheral vascular disease) ([place])     PAST SURGICAL HISTORY:   Past Surgical History:  Procedure Laterality Date   BACK SURGERY     CHOLECYSTECTOMY     ERCP W/ SPHICTEROTOMY  2015   FEMORAL ARTERY - POPLITEAL ARTERY BYPASS GRAFT Right 06/19/2018   THROMBOENDARTERECTOMY Right 06/19/2018   TONSILLECTOMY     VASCULAR SURGERY      SOCIAL HISTORY:   Social History   Tobacco Use   Smoking status: Former Smoker   Smokeless tobacco: Never Used  Substance Use Topics   Alcohol use: Yes    Alcohol/week: 7.0 standard drinks    Types: 7 Shots of liquor per week  FAMILY HISTORY:  History reviewed. No pertinent family history.  DRUG ALLERGIES:   Allergies  Allergen Reactions   Amoxicillin Other (See Comments)    Abdominal cramps    REVIEW OF SYSTEMS:   ROS As per history of present illness. All pertinent systems were reviewed above. Constitutional,  HEENT, cardiovascular, respiratory, GI, GU, musculoskeletal, neuro, psychiatric, endocrine,  integumentary and hematologic systems were reviewed and are otherwise  negative/unremarkable except for positive findings mentioned above in the HPI.   MEDICATIONS AT HOME:   Prior to Admission medications   Medication Sig Start Date End Date Taking? Authorizing Provider  DULoxetine (CYMBALTA) 60 MG capsule Take  1 capsule by mouth 2 (two) times daily. 01/09/19   [provider]  ELIQUIS 5 MG TABS tablet Take 1 tablet by mouth 2 (two) times daily. 12/30/18   [provider]  esomeprazole (NEXIUM) 40 MG capsule Take 1 capsule by mouth daily. 12/03/18   [provider]  famotidine (PEPCID) 40 MG tablet Take 1 tablet by mouth every morning. 01/21/19   [provider]  HYDROcodone-acetaminophen (NORCO/VICODIN) 5-325 MG tablet Take 0.5-1 tablets by mouth 2 (two) times daily as needed for pain. 01/22/19   [provider]  losartan-hydrochlorothiazide (HYZAAR) 100-25 MG tablet Take 1 tablet by mouth daily. 01/19/19   [provider]  metoprolol succinate (TOPROL-XL) 25 MG 24 hr tablet Take 1 tablet by mouth daily. 11/30/18   [provider]  sucralfate (CARAFATE) 1 g tablet Take 1 tablet by mouth 4 (four) times daily -  before meals and at bedtime. 01/23/19   [provider]  tamsulosin (FLOMAX) 0.4 MG CAPS capsule Take 1 capsule by mouth daily. 01/09/19   [provider]      VITAL SIGNS:  Blood pressure (!) 124/108, pulse 92, temperature 98.4 F (36.9 C), temperature source Oral, resp. rate (!) 23, height 5\' 8"  (1.727 m), weight 93.4 kg, SpO2 100 %.  PHYSICAL EXAMINATION:  Physical Exam  GENERAL:  68 y.o.-year-old Caucasian male patient lying in the bed with no acute distress.  EYES: Pupils equal, round, reactive to light and accommodation. No scleral icterus. Extraocular muscles intact.  HEENT: Head atraumatic, normocephalic. Oropharynx and nasopharynx clear.  NECK:  Supple, no jugular venous distention. No thyroid enlargement, no tenderness.  LUNGS: Normal breath sounds bilaterally, no wheezing, rales,rhonchi or crepitation. No use of accessory muscles of respiration.  CARDIOVASCULAR: Regular rate and rhythm, S1, S2 normal. No murmurs, rubs, or gallops.  ABDOMEN: Soft, nondistended, nontender. Bowel sounds present. No organomegaly or  mass.  EXTREMITIES: No pedal edema, cyanosis, or clubbing.  His right leg is larger than the left with scar of femoral-popliteal bypass. NEUROLOGIC: Cranial nerves II through XII are intact. Muscle strength 5/5 in all extremities. Sensation intact. Gait not checked.  PSYCHIATRIC: The patient is alert and oriented x 3.  Normal affect and good eye contact. SKIN: No obvious rash, lesion, or ulcer.   LABORATORY PANEL:   CBC Recent Labs  Lab 01/28/19 0331  WBC 7.8  HGB 14.4  HCT 42.2  PLT 134*   ------------------------------------------------------------------------------------------------------------------  Chemistries  Recent Labs  Lab 01/28/19 0331  NA 139  K 3.0*  CL 102  CO2 22  GLUCOSE 147*  BUN 18  CREATININE 1.10  CALCIUM 9.1  MG 1.7  AST 46*  ALT 34  ALKPHOS 51  BILITOT 1.1   ------------------------------------------------------------------------------------------------------------------  Cardiac Enzymes Recent Labs  Lab 01/28/19 0427  TROPONINI 2.29*   ------------------------------------------------------------------------------------------------------------------  RADIOLOGY:  Dg  Chest Port 1 View  Result Date: 01/28/2019 CLINICAL DATA:  68 y/o M; sharp anterior chest pain radiating down the left arm. EXAM: PORTABLE CHEST 1 VIEW COMPARISON:  11/08/2013 chest radiograph. FINDINGS: Stable cardiac silhouette within normal limits given projection and technique. Aortic atherosclerosis with calcification. Clear lungs. No pleural effusion or pneumothorax. No acute osseous abnormality is evident. IMPRESSION: No acute pulmonary process identified. Aortic Atherosclerosis (ICD10-I70.0). Electronically Signed   By: Kristine Garbe M.D.   On: 01/28/2019 03:42      IMPRESSION AND PLAN:   #1.  Non-STEMI.   The patient will be admitted to a telemetry bed.  Will follow serial cardiac enzymes and EKGs.  We will obtain a cardiology consult this a.m. for likely  need of cardiac catheterization.  Dr. Nehemiah Massed was notified about the patient. The patient will be placed on aspirin as well as p.r.n. sublingual nitroglycerin and Nitropaste as well as morphine sulfate for pain.  We will continue IV heparin drip.  We will hold off Eliquis for now which she takes for history of unprovoked PE since he will be on IV heparin..  2.  Hypokalemia.  Potassium will be replaced and magnesium level will be optimized.  3.  Hypertension.  We will continue Cozaar and Lopressor.  4.  GERD.  PPI therapy and Carafate will be resumed.  5.  Type 2 diabetes mellitus.  The patient will be placed on supplemental coverage with NovoLog.  6.  BPH.  Flomax will be resumed.  7.  Peripheral neuropathy and peripheral vascular disease.  Neurontin will be continued.  Eliquis will be held and he will be placed on aspirin.  8. DVT prophylaxis.  This is covered with IV heparin drip and GI prophylaxis was addressed above.    All the records are reviewed and case discussed with ED provider. The plan of care was discussed in details with the patient (and family). I answered all questions. The patient agreed to proceed with the above mentioned plan. Further management will depend upon hospital course.   CODE STATUS: Full code  TOTAL TIME TAKING CARE OF THIS PATIENT: 55 minutes.    Christel Mormon M.D on 01/28/2019 at [place] AM  Pager - [phone]  After 6pm go to [email or web address] - password [email or web address] [facility]  [employer] [facility]  Office  [phone]  CC: Primary care physician; [name], PA-C   Note: This dictation was prepared with Dragon dictation along with smaller phrase technology. Any transcriptional errors that result from this process are unintentional.

## 2019-01-28 NOTE — ED Triage Notes (Addendum)
EMS pt to rm 24 from home with report of awakened from sleep around 1230 am with chest pain radiating down lefy arm. Denies n/v or shob. EMS gave 324 mg of ASA.

## 2019-01-28 NOTE — ED Notes (Signed)
CPR done for apox 2-46min

## 2019-01-28 NOTE — Progress Notes (Signed)
*  PRELIMINARY RESULTS* Echocardiogram 2D Echocardiogram has been performed.  Damon English 01/28/2019, 2:56 PM

## 2019-01-28 NOTE — ED Notes (Signed)
ED TO INPATIENT HANDOFF REPORT  ED Nurse Name and Phone #: [place], [place] Name/Age/Gender Damon English 68 y.o. male Room/Bed: ED24A/ED24A  Code Status   Code Status: Full Code  Home/SNF/Other Home Patient oriented to: self, place, time and situation Is this baseline? Yes   Triage Complete: Triage complete  Chief Complaint Chest Pain  Triage Note EMS pt to rm 24 from home with report of awakened from sleep around 1230 am with chest pain radiating down lefy arm. Denies n/v or shob. EMS gave 324 mg of ASA.    Allergies Allergies  Allergen Reactions  . Amoxicillin Other (See Comments)    Abdominal cramps    Level of Care/Admitting Diagnosis ED Disposition    ED Disposition Condition Comment   Admit  Hospital Area: [place] [[id number]]  Level of Care: Telemetry [5]  Diagnosis: NSTEMI (non-ST elevated myocardial infarction) [facility]) [[id number]]  Admitting Physician: Christel Mormon [[id number]]  Attending Physician: Christel Mormon [[id number]]  Estimated length of stay: past midnight tomorrow  Certification:: I certify this patient will need inpatient services for at least 2 midnights  PT Class (Do Not Modify): Inpatient [101]  PT Acc Code (Do Not Modify): Private [1]       B Medical/Surgery History Past Medical History:  Diagnosis Date  . Alcohol use    "cutting back" but still heavy and daily, multiple shots of bourbon each night  . Diabetes mellitus without complication ([place])   . History of tobacco use    reportedly quit in 2013  . Hypertension   . Pulmonary embolism ([place]) 2012   unprovoked  . PVD (peripheral vascular disease) ([place])    Past Surgical History:  Procedure Laterality Date  . BACK SURGERY    . CHOLECYSTECTOMY    . ERCP W/ SPHICTEROTOMY  2015  . FEMORAL ARTERY - POPLITEAL ARTERY BYPASS GRAFT Right 06/19/2018  . THROMBOENDARTERECTOMY Right 06/19/2018  . TONSILLECTOMY    . VASCULAR SURGERY       A IV  Location/Drains/Wounds Patient Lines/Drains/Airways Status   Active Line/Drains/Airways    Name:   Placement date:   Placement time:   Site:   Days:   Peripheral IV 01/28/19 Left Hand   01/28/19    0334    Hand   less than 1   Peripheral IV 01/28/19 Left Forearm   01/28/19    0624    Forearm   less than 1          Intake/Output Last 24 hours No intake or output data in the 24 hours ending 01/28/19 0701  Labs/Imaging Results for orders placed or performed during the hospital encounter of 01/28/19 (from the past 48 hour(s))  CBC     Status: Abnormal   Collection Time: 01/28/19  3:31 AM  Result Value Ref Range   WBC 7.8 4.0 - 10.5 K/uL   RBC 4.11 (L) 4.22 - 5.81 MIL/uL   Hemoglobin 14.4 13.0 - 17.0 g/dL   HCT 42.2 39.0 - 52.0 %   MCV 102.7 (H) 80.0 - 100.0 fL   MCH 35.0 (H) 26.0 - 34.0 pg   MCHC 34.1 30.0 - 36.0 g/dL   RDW 12.9 11.5 - 15.5 %   Platelets 134 (L) 150 - 400 K/uL   nRBC 0.0 0.0 - 0.2 %    Comment: Performed at [facility], [address] [place], [place] [address]  Comprehensive metabolic panel     Status: Abnormal   Collection Time:  01/28/19  3:31 AM  Result Value Ref Range   Sodium 139 135 - 145 mmol/L   Potassium 3.0 (L) 3.5 - 5.1 mmol/L   Chloride 102 98 - 111 mmol/L   CO2 22 22 - 32 mmol/L   Glucose, Bld 147 (H) 70 - 99 mg/dL   BUN 18 8 - 23 mg/dL   Creatinine, Ser 1.10 0.61 - 1.24 mg/dL   Calcium 9.1 8.9 - 10.3 mg/dL   Total Protein 7.5 6.5 - 8.1 g/dL   Albumin 4.1 3.5 - 5.0 g/dL   AST 46 (H) 15 - 41 U/L   ALT 34 0 - 44 U/L   Alkaline Phosphatase 51 38 - 126 U/L   Total Bilirubin 1.1 0.3 - 1.2 mg/dL   GFR calc non Af Amer >60 >60 mL/min   GFR calc Af Amer >60 >60 mL/min   Anion gap 15 5 - 15    Comment: Performed at [facility], [address] [place], [place] [address]  Lipase, blood     Status: None   Collection Time: 01/28/19  3:31 AM  Result Value Ref Range   Lipase 31 11 - 51 U/L    Comment: Performed at [facility], [address] [place], [place] [address]  Magnesium     Status: None   Collection Time: 01/28/19  3:31 AM  Result Value Ref Range   Magnesium 1.7 1.7 - 2.4 mg/dL    Comment: Performed at [facility], [address] [place], [place] [address]  Protime-INR     Status: Abnormal   Collection Time: 01/28/19  4:26 AM  Result Value Ref Range   Prothrombin Time 16.1 (H) 11.4 - 15.2 seconds   INR 1.3 (H) 0.8 - 1.2    Comment: (NOTE) INR goal varies based on device and disease states. Performed at [facility], [address] [place], [place] [address]   APTT     Status: None   Collection Time: 01/28/19  4:26 AM  Result Value Ref Range   aPTT 31 24 - 36 seconds    Comment: Performed at [facility], [address] [place], [place] [address]  Troponin I -     Status: Abnormal   Collection Time: 01/28/19  4:27 AM  Result Value Ref Range   Troponin I 2.29 (HH) <0.03 ng/mL    Comment: CRITICAL RESULT CALLED TO, READ BACK BY AND VERIFIED WITH KIM SUMMERS 01/28/2019 @ 0532 RDW Performed at [facility], [address] [place], [place] [address]   TSH     Status: None   Collection Time: 01/28/19  4:27 AM  Result Value Ref Range   TSH 1.325 0.350 - 4.500 uIU/mL    Comment: Performed by a 3rd Generation assay with a functional sensitivity of <=0.01 uIU/mL. Performed at [facility], [address] [place], [place] [address]    Dg Chest Port 1 View  Result Date: 01/28/2019 CLINICAL DATA:  68 y/o M; sharp anterior chest pain radiating down the left arm. EXAM: PORTABLE CHEST 1 VIEW COMPARISON:  11/08/2013 chest radiograph. FINDINGS: Stable cardiac silhouette within normal limits given projection and technique. Aortic atherosclerosis with calcification. Clear lungs. No pleural effusion or pneumothorax. No acute osseous abnormality is evident. IMPRESSION: No acute pulmonary process identified. Aortic Atherosclerosis  (ICD10-I70.0). Electronically Signed   By: Kristine Garbe M.D.   On: 01/28/2019 03:42    Pending Labs Unresulted Labs (From admission, onward)    Start  Ordered   01/29/19 6295  Basic metabolic panel  Tomorrow morning,   STAT     01/28/19 0608   01/29/19 0500  Lipid panel  Tomorrow morning,   STAT     01/28/19 0608   01/29/19 0500  CBC  Tomorrow morning,   STAT     01/28/19 0608   01/29/19 0500  Protime-INR  Tomorrow morning,   STAT     01/28/19 0608   01/28/19 0552  Heparin level (unfractionated)  Add-on,   AD     01/28/19 0551          Vitals/Pain Today's Vitals   01/28/19 0420 01/28/19 0430 01/28/19 0530 01/28/19 0630  BP:  134/78 (!) 124/108 (!) 151/86  Pulse:  (!) 104 92 93  Resp:  17 (!) 23 19  Temp:      TempSrc:      SpO2:  99% 100% 99%  Weight:      Height:      PainSc: 3        Isolation Precautions No active isolations  Medications Medications  heparin ADULT infusion 100 units/mL (25000 units/292mL sodium chloride 0.45%) (1,100 Units/hr Intravenous New Bag/Given 01/28/19 0628)  HYDROcodone-acetaminophen (NORCO/VICODIN) 5-325 MG per tablet 1 tablet (has no administration in time range)  DULoxetine (CYMBALTA) DR capsule 60 mg (has no administration in time range)  losartan-hydrochlorothiazide (HYZAAR) 100-25 MG per tablet 1 tablet (has no administration in time range)  pantoprazole (PROTONIX) EC tablet 40 mg (has no administration in time range)  famotidine (PEPCID) tablet 40 mg (has no administration in time range)  sucralfate (CARAFATE) tablet 1 g (has no administration in time range)  tamsulosin (FLOMAX) capsule 0.4 mg (has no administration in time range)  heparin ADULT infusion 100 units/mL (25000 units/254mL sodium chloride 0.45%) (has no administration in time range)  potassium chloride SA (K-DUR,KLOR-CON) CR tablet 40 mEq (has no administration in time range)  magnesium sulfate IVPB 2 g 50 mL (has no administration in time range)   aspirin chewable tablet 324 mg (has no administration in time range)    Or  aspirin suppository 300 mg (has no administration in time range)  aspirin EC tablet 81 mg (has no administration in time range)  nitroGLYCERIN (NITROSTAT) SL tablet 0.4 mg (has no administration in time range)  acetaminophen (TYLENOL) tablet 650 mg (has no administration in time range)  ondansetron (ZOFRAN) injection 4 mg (has no administration in time range)  0.9 %  sodium chloride infusion (has no administration in time range)  nitroGLYCERIN (NITROGLYN) 2 % ointment 0.5 inch (has no administration in time range)  metoprolol tartrate (LOPRESSOR) tablet 25 mg (has no administration in time range)  zolpidem (AMBIEN) tablet 5 mg (has no administration in time range)  insulin aspart (novoLOG) injection 0-9 Units (has no administration in time range)  potassium chloride SA (K-DUR,KLOR-CON) CR tablet 40 mEq (40 mEq Oral Given 01/28/19 0533)    Mobility walks Moderate fall risk   Focused Assessments Cardiac Assessment Handoff:  Cardiac Rhythm: Sinus tachycardia Lab Results  Component Value Date   TROPONINI 2.29 ([place]) 01/28/2019   No results found for: DDIMER Does the Patient currently have chest pain? No     R Recommendations: See Admitting Provider Note  Report given to:   Additional Notes: na

## 2019-01-28 NOTE — Progress Notes (Signed)
ANTICOAGULATION CONSULT NOTE - Initial Consult  Pharmacy Consult for heparin Indication: chest pain/ACS  Allergies  Allergen Reactions  . Amoxicillin Other (See Comments)    Abdominal cramps    Patient Measurements: Height: 5\' 8"  (172.7 cm) Weight: 206 lb (93.4 kg) IBW/kg (Calculated) : 68.4 Heparin Dosing Weight: 87.9 kg  Vital Signs: Temp: 98 F (36.7 C) (04/13 2000) Temp Source: Oral (04/13 2000) BP: 135/74 (04/13 2200) Pulse Rate: 75 (04/13 2200)  Labs: Recent Labs    01/28/19 0331 01/28/19 0426  01/28/19 1127 01/28/19 1305 01/28/19 1621 01/28/19 2235  HGB 14.4  --   --   --   --   --   --   HCT 42.2  --   --   --   --   --   --   PLT 134*  --   --   --   --   --   --   APTT  --  31  --   --  56*  --  98*  LABPROT  --  16.1*  --   --   --   --   --   INR  --  1.3*  --   --   --   --   --   HEPARINUNFRC  --  3.06*  --   --   --   --   --   CREATININE 1.10  --   --   --   --   --   --   TROPONINI  --   --    < > 3.72*  --  5.22* 6.17*   < > = values in this interval not displayed.    Estimated Creatinine Clearance: 74.2 mL/min (by C-G formula based on SCr of 1.1 mg/dL).   Medical History: Past Medical History:  Diagnosis Date  . Alcohol use    "cutting back" but still heavy and daily, multiple shots of bourbon each night  . Diabetes mellitus without complication ([place])   . History of tobacco use    reportedly quit in 2013  . Hypertension   . Pulmonary embolism ([place]) 2012   unprovoked  . PVD (peripheral vascular disease) ([facility])     Medications:  Scheduled:  . aspirin  324 mg Oral NOW   Or  . aspirin  300 mg Rectal NOW  . [START ON 01/29/2019] aspirin EC  81 mg Oral Daily  . DULoxetine  60 mg Oral BID  . famotidine  40 mg Oral q morning - 10a  . losartan  100 mg Oral Daily   And  . hydrochlorothiazide  25 mg Oral Daily  . insulin aspart  0-9 Units Subcutaneous Q4H  . metoprolol tartrate  25 mg Oral BID  . nitroGLYCERIN  0.5 inch Topical Q6H  .  pantoprazole  40 mg Oral Daily  . [START ON 01/29/2019] pneumococcal 23 valent vaccine  0.5 mL Intramuscular Tomorrow-1000  . potassium chloride  40 mEq Oral BID  . sucralfate  1 g Oral TID AC & HS  . tamsulosin  0.4 mg Oral Daily    Assessment: Patient arrives to ED for CP radiating down L arm after being awoken from sleep. Initial trop on arrival is 2.29. 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 Patient is being started on heparin drip for NSTEMI  Goal of Therapy:  Heparin level 0.3-0.7 units/ml Monitor platelets by anticoagulation protocol: Yes  APTT goal: 66-102 seconds   Plan:  4/13 @ 2235 aPTT: 98. Level is therapeutic x1. Continue heparin rate of 1250 units/hr. Recheck confirmatory aPTT level with AM labs.   Will continue to adjust dose based on aPTT for now and will switch to dosing to heparin level once both levels correlate.   Will continue to monitor daily CBC's and anti-Xa levels per protocol.   Pernell Dupre, PharmD, BCPS Clinical Pharmacist 01/28/2019 11:19 PM

## 2019-01-29 LAB — CBC
HCT: 40.5 % (ref 39.0–52.0)
Hemoglobin: 13.8 g/dL (ref 13.0–17.0)
MCH: 35.1 pg — ABNORMAL HIGH (ref 26.0–34.0)
MCHC: 34.1 g/dL (ref 30.0–36.0)
MCV: 103.1 fL — ABNORMAL HIGH (ref 80.0–100.0)
Platelets: 141 10*3/uL — ABNORMAL LOW (ref 150–400)
RBC: 3.93 MIL/uL — ABNORMAL LOW (ref 4.22–5.81)
RDW: 12.9 % (ref 11.5–15.5)
WBC: 8.1 10*3/uL (ref 4.0–10.5)
nRBC: 0 % (ref 0.0–0.2)

## 2019-01-29 LAB — BASIC METABOLIC PANEL
Anion gap: 10 (ref 5–15)
BUN: 22 mg/dL (ref 8–23)
CO2: 26 mmol/L (ref 22–32)
Calcium: 8.9 mg/dL (ref 8.9–10.3)
Chloride: 102 mmol/L (ref 98–111)
Creatinine, Ser: 1.11 mg/dL (ref 0.61–1.24)
GFR calc Af Amer: >60 mL/min (ref >60)
GFR calc non Af Amer: >60 mL/min (ref >60)
Glucose, Bld: 125 mg/dL — ABNORMAL HIGH (ref 70–99)
Potassium: 3.8 mmol/L (ref 3.5–5.1)
Sodium: 138 mmol/L (ref 135–145)

## 2019-01-29 LAB — GLUCOSE, CAPILLARY
Glucose-Capillary: 103 mg/dL — ABNORMAL HIGH (ref 70–99)
Glucose-Capillary: 103 mg/dL — ABNORMAL HIGH (ref 70–99)
Glucose-Capillary: 109 mg/dL — ABNORMAL HIGH (ref 70–99)
Glucose-Capillary: 114 mg/dL — ABNORMAL HIGH (ref 70–99)
Glucose-Capillary: 121 mg/dL — ABNORMAL HIGH (ref 70–99)
Glucose-Capillary: 125 mg/dL — ABNORMAL HIGH (ref 70–99)

## 2019-01-29 LAB — LIPID PANEL
Cholesterol: 143 mg/dL (ref 0–200)
HDL: 33 mg/dL — ABNORMAL LOW (ref >40)
LDL Cholesterol: 93 mg/dL (ref 0–99)
Total CHOL/HDL Ratio: 4.3 RATIO
Triglycerides: 87 mg/dL (ref <150)
VLDL: 17 mg/dL (ref 0–40)

## 2019-01-29 LAB — PROTIME-INR
INR: 1.3 — ABNORMAL HIGH (ref 0.8–1.2)
Prothrombin Time: 15.8 seconds — ABNORMAL HIGH (ref 11.4–15.2)

## 2019-01-29 LAB — APTT
aPTT: 125 seconds — ABNORMAL HIGH (ref 24–36)
aPTT: 129 seconds — ABNORMAL HIGH (ref 24–36)

## 2019-01-29 LAB — TROPONIN I
Troponin I: 4.75 ng/mL (ref <0.03)
Troponin I: 2.77 ng/mL (ref <0.03)

## 2019-01-29 LAB — MRSA PCR SCREENING: MRSA by PCR: NEGATIVE

## 2019-01-29 LAB — HEPARIN LEVEL (UNFRACTIONATED): Heparin Unfractionated: 1.75 IU/mL — ABNORMAL HIGH (ref 0.30–0.70)

## 2019-01-29 MED ORDER — THIAMINE HCL 100 MG/ML IJ SOLN
INTRAVENOUS | Status: DC
  Administered 2019-01-29 – 2019-01-30 (×3): via INTRAVENOUS
  Filled 2019-01-29 (×4): qty 1000

## 2019-01-29 MED ORDER — MORPHINE SULFATE (PF) 2 MG/ML IV SOLN
2.0000 mg | Freq: Once | INTRAVENOUS | Status: AC
  Administered 2019-01-29: 2 mg via INTRAVENOUS

## 2019-01-29 MED ORDER — NITROGLYCERIN 2 % TD OINT
1.0000 [in_us] | TOPICAL_OINTMENT | Freq: Four times a day (QID) | TRANSDERMAL | Status: DC
  Administered 2019-01-29 – 2019-01-30 (×4): 1 [in_us] via TOPICAL
  Filled 2019-01-29 (×5): qty 1

## 2019-01-29 MED ORDER — METOPROLOL TARTRATE 50 MG PO TABS
50.0000 mg | ORAL_TABLET | Freq: Two times a day (BID) | ORAL | Status: DC
  Administered 2019-01-29 – 2019-01-30 (×3): 50 mg via ORAL
  Filled 2019-01-29 (×3): qty 1

## 2019-01-29 MED ORDER — THIAMINE HCL 100 MG/ML IJ SOLN
100.0000 mg | Freq: Every day | INTRAMUSCULAR | Status: DC
  Administered 2019-01-29 – 2019-01-30 (×2): 100 mg via INTRAVENOUS
  Filled 2019-01-29 (×2): qty 2

## 2019-01-29 MED ORDER — MORPHINE SULFATE (PF) 2 MG/ML IV SOLN
2.0000 mg | INTRAVENOUS | Status: DC | PRN
  Administered 2019-01-29 – 2019-01-30 (×4): 2 mg via INTRAVENOUS
  Filled 2019-01-29 (×5): qty 1

## 2019-01-29 MED ORDER — MORPHINE SULFATE (PF) 2 MG/ML IV SOLN
INTRAVENOUS | Status: AC
  Filled 2019-01-29: qty 1

## 2019-01-29 MED ORDER — POTASSIUM CHLORIDE CRYS ER 20 MEQ PO TBCR: 40.0000 meq | EXTENDED_RELEASE_TABLET | Freq: Two times a day (BID) | ORAL | Status: DC

## 2019-01-29 MED ORDER — DEXMEDETOMIDINE HCL IN NACL 400 MCG/100ML IV SOLN: 0.2000 ug/kg/h | INTRAVENOUS | Status: DC

## 2019-01-29 NOTE — Progress Notes (Signed)
ANTICOAGULATION CONSULT NOTE - Initial Consult  Pharmacy Consult for heparin Indication: chest pain/ACS  Allergies  Allergen Reactions  . Amoxicillin Other (See Comments)    Abdominal cramps    Patient Measurements: Height: 5\' 8"  (172.7 cm) Weight: 206 lb (93.4 kg) IBW/kg (Calculated) : 68.4 Heparin Dosing Weight: 87.9 kg  Vital Signs: Temp: 97.7 F (36.5 C) (04/14 0400) Temp Source: Oral (04/14 0400) BP: 122/73 (04/14 0600) Pulse Rate: 72 (04/14 0600)  Labs: Recent Labs    01/28/19 0331  01/28/19 0426  01/28/19 1127 01/28/19 1305 01/28/19 1621 01/28/19 2235 01/29/19 0515  HGB 14.4  --   --   --   --   --   --   --  13.8  HCT 42.2  --   --   --   --   --   --   --  40.5  PLT 134*  --   --   --   --   --   --   --  141*  APTT  --    < > 31  --   --  56*  --  98* 125*  LABPROT  --   --  16.1*  --   --   --   --   --  15.8*  INR  --   --  1.3*  --   --   --   --   --  1.3*  HEPARINUNFRC  --   --  3.06*  --   --   --   --   --  1.75*  CREATININE 1.10  --   --   --   --   --   --   --  1.11  TROPONINI  --   --   --    < > 3.72*  --  5.22* 6.17*  --    < > = values in this interval not displayed.    Estimated Creatinine Clearance: 73.6 mL/min (by C-G formula based on SCr of 1.11 mg/dL).   Medical History: Past Medical History:  Diagnosis Date  . Alcohol use    "cutting back" but still heavy and daily, multiple shots of bourbon each night  . Diabetes mellitus without complication ([place])   . History of tobacco use    reportedly quit in 2013  . Hypertension   . Pulmonary embolism ([place]) 2012   unprovoked  . PVD (peripheral vascular disease) ([facility])     Medications:  Scheduled:  . aspirin EC  81 mg Oral Daily  . DULoxetine  60 mg Oral BID  . famotidine  40 mg Oral q morning - 10a  . losartan  100 mg Oral Daily   And  . hydrochlorothiazide  25 mg Oral Daily  . insulin aspart  0-9 Units Subcutaneous Q4H  . metoprolol tartrate  25 mg Oral BID  . nitroGLYCERIN   0.5 inch Topical Q6H  . pantoprazole  40 mg Oral Daily  . pneumococcal 23 valent vaccine  0.5 mL Intramuscular Tomorrow-1000  . potassium chloride  40 mEq Oral BID  . sucralfate  1 g Oral TID AC & HS  . tamsulosin  0.4 mg Oral Daily    Assessment: Patient arrives to ED for CP radiating down L arm after being awoken from sleep. Initial trop on arrival is 2.29. 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 Patient is being started on heparin drip for NSTEMI   Goal of Therapy:  Heparin level 0.3-0.7 units/ml Monitor platelets by anticoagulation protocol: Yes  APTT goal: 66-102 seconds   Plan:  4/14 @ 0515 aPTT: 125. Level is supratherapeutic. H&H stable. Decrease heparin rate to 1100 units/hr. Recheck confirmatory aPTT level at 1500.  HL @ 0515: 1.75 Will continue to adjust dose based on aPTT for now and will switch to dosing to heparin level once both levels correlate.   Will continue to monitor daily CBC's and anti-Xa levels per protocol.    Paticia Stack, PharmD Pharmacy Resident  01/29/2019 8:20 AM

## 2019-01-29 NOTE — Progress Notes (Signed)
ANTICOAGULATION CONSULT NOTE  Pharmacy Consult for heparin Indication: chest pain/ACS  Patient Measurements: Height: 5\' 8"  (172.7 cm) Weight: 206 lb (93.4 kg) IBW/kg (Calculated) : 68.4 Heparin Dosing Weight: 87.9 kg  Vital Signs: Temp: 97.6 F (36.4 C) (04/14 1400) Temp Source: Axillary (04/14 1400) BP: 112/62 (04/14 1600) Pulse Rate: 77 (04/14 1600)  Labs: Recent Labs    01/28/19 0331  01/28/19 0426  01/28/19 1305  01/28/19 2235 01/29/19 0515 01/29/19 1420  HGB 14.4  --   --   --   --   --   --  13.8  --   HCT 42.2  --   --   --   --   --   --  40.5  --   PLT 134*  --   --   --   --   --   --  141*  --   APTT  --    < > 31  --  56*  --  98* 125*  --   LABPROT  --   --  16.1*  --   --   --   --  15.8*  --   INR  --   --  1.3*  --   --   --   --  1.3*  --   HEPARINUNFRC  --   --  3.06*  --   --   --   --  1.75*  --   CREATININE 1.10  --   --   --   --   --   --  1.11  --   TROPONINI  --   --   --    < >  --    < > 6.17* 4.75* 2.77*   < > = values in this interval not displayed.    Estimated Creatinine Clearance: 73.6 mL/min (by C-G formula based on SCr of 1.11 mg/dL).   Medical History: Past Medical History:  Diagnosis Date  . Alcohol use    "cutting back" but still heavy and daily, multiple shots of bourbon each night  . Diabetes mellitus without complication ([place])   . History of tobacco use    reportedly quit in 2013  . Hypertension   . Pulmonary embolism ([place]) 2012   unprovoked  . PVD (peripheral vascular disease) ([facility])     Medications:  Scheduled:  . DULoxetine  60 mg Oral BID  . famotidine  40 mg Oral q morning - 10a  . losartan  100 mg Oral Daily   And  . hydrochlorothiazide  25 mg Oral Daily  . insulin aspart  0-9 Units Subcutaneous Q4H  . metoprolol tartrate  50 mg Oral BID  . nitroGLYCERIN  1 inch Topical Q6H  . pantoprazole  40 mg Oral Daily  . potassium chloride  40 mEq Oral BID  . sucralfate  1 g Oral TID AC & HS  . tamsulosin  0.4 mg  Oral Daily  . thiamine injection  100 mg Intravenous Daily    Assessment: Patient arrives to ED for CP radiating down L arm after being awoken from sleep. Initial trop on arrival was 2.29. 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 Patient is being started on heparin drip for NSTEMI. CBC stable, wnl  Goal of Therapy:  Heparin level 0.3-0.7 units/ml Monitor platelets by anticoagulation  protocol: Yes  APTT goal: 66-102 seconds  Heparin Course: 4/13 AM initiation: started at 1100 units/hr 4/13 1305 aPTT 56s: inc to 1250 units/hr 4/13 2235 aPTT 98s 4/14 0515 aPTT 125s, HL 1.75: rate dec to 1100 units/hr 4/14 1420 aPTT 129s  Plan:  --decrease heparin rate to 950 units/hr --Recheck aPTT level in 6 hours after rate change  Will continue to adjust dose based on aPTT for now and will switch to dosing to heparin level once both levels correlate.   Will continue to monitor daily CBC's and anti-Xa levels per protocol.   Dallie Piles, PharmD Clinical Pharmacist 01/29/2019 4:13 PM

## 2019-01-29 NOTE — Progress Notes (Signed)
[facility] Encounter Note  Patient: JOSHUWA VECCHIO / Admit Date: 01/28/2019 / Date of Encounter: 01/29/2019, 2:51 PM   Subjective: Patient has been admitted to the hospital for acute non-ST elevation myocardial infarction with peak troponin of 5.2 Echocardiogram showing some hypokinesis of the anterior apical myocardial segment with minimal valvular heart disease most consistent with a possible myocardial infarction and ejection fraction of 40% Patient does have peripheral vascular disease with no current symptoms Episode of apparent V. fib arrest yesterday morning around the time the patient likely was having some ischemic issues and post infarct fever fib arrest was treated with amiodarone as well as defibrillation. Patient he was continued on heparin and appropriate medication management and suddenly has had some chest discomfort over this past hour substernal in nature with some diaphoresis and shortness of breath.  This is completely resolved at this time and patient feels very comfortable.  There was some anterolateral ST depression but this is improving as well. Currently patient is hemodynamically stable We have had a long discussion with the patient about cardiac catheterization and risks and benefits.  This discussion included possibility of other bleeding complications when Eliquis is used.  He has had no use of Eliquis since Sunday evening and therefore would be lower risk at this time.  If the patient needs further emergent intervention would proceed with cardiac catheterization but otherwise will plan for tomorrow morning  Review of Systems: Positive for: Chest pain Negative for: Vision change, hearing change, syncope, dizziness, nausea, vomiting,diarrhea, bloody stool, stomach pain, cough, congestion, diaphoresis, urinary frequency, urinary pain,skin lesions, skin rashes Others previously listed  Objective: Telemetry: Normal sinus rhythm Physical Exam:  Blood pressure (!) 160/93, pulse 85, temperature 97.6 F (36.4 C), temperature source Axillary, resp. rate 13, height 5\' 8"  (1.727 m), weight 93.4 kg, SpO2 100 %. Body mass index is 31.32 kg/m. General: Well developed, well nourished, in no acute distress. Head: Normocephalic, atraumatic, sclera non-icteric, no xanthomas, nares are without discharge. Neck: No apparent masses Lungs: Normal respirations with no wheezes, no rhonchi, no rales , few crackles   Heart: Regular rate and rhythm, normal S1 S2, no murmur, no rub, no gallop, PMI is normal size and placement, carotid upstroke normal without bruit, jugular venous pressure normal Abdomen: Soft, non-tender, non-distended with normoactive bowel sounds. No hepatosplenomegaly. Abdominal aorta is normal size without bruit Extremities: Trace edema, no clubbing, no cyanosis, no ulcers,  Peripheral: 2+ radial, 2+ femoral, 0 + dorsal pedal pulses Neuro: Alert and oriented. Moves all extremities spontaneously. Psych:  Responds to questions appropriately with a normal affect.   Intake/Output Summary (Last 24 hours) at 01/29/2019 1451 Last data filed at 01/29/2019 1423 Gross per 24 hour  Intake 275.47 ml  Output 850 ml  Net -574.53 ml    Inpatient Medications:  . DULoxetine  60 mg Oral BID  . famotidine  40 mg Oral q morning - 10a  . losartan  100 mg Oral Daily   And  . hydrochlorothiazide  25 mg Oral Daily  . insulin aspart  0-9 Units Subcutaneous Q4H  . metoprolol tartrate  25 mg Oral BID  . nitroGLYCERIN  0.5 inch Topical Q6H  . pantoprazole  40 mg Oral Daily  . potassium chloride  40 mEq Oral BID  . sucralfate  1 g Oral TID AC & HS  . tamsulosin  0.4 mg Oral Daily  . thiamine injection  100 mg Intravenous Daily   Infusions:  . sodium chloride    .  dexmedetomidine (PRECEDEX) IV infusion    . heparin 1,100 Units/hr (01/29/19 1423)  . magnesium sulfate 1 - 4 g bolus IVPB    . banana bag IV 1000 mL      Labs: Recent Labs     01/28/19 0331 01/29/19 0515  NA 139 138  K 3.0* 3.8  CL 102 102  CO2 22 26  GLUCOSE 147* 125*  BUN 18 22  CREATININE 1.10 1.11  CALCIUM 9.1 8.9  MG 1.7  --    Recent Labs    01/28/19 0331  AST 46*  ALT 34  ALKPHOS 51  BILITOT 1.1  PROT 7.5  ALBUMIN 4.1   Recent Labs    01/28/19 0331 01/29/19 0515  WBC 7.8 8.1  HGB 14.4 13.8  HCT 42.2 40.5  MCV 102.7* 103.1*  PLT 134* 141*   Recent Labs    01/28/19 1127 01/28/19 1621 01/28/19 2235 01/29/19 0515  TROPONINI 3.72* 5.22* 6.17* 4.75*   Invalid input(s): POCBNP No results for input(s): HGBA1C in the last 72 hours.   Weights: Filed Weights   01/28/19 0320  Weight: 93.4 kg     Radiology/Studies:  Dg Chest Port 1 View  Result Date: 01/28/2019 CLINICAL DATA:  68 y/o M; sharp anterior chest pain radiating down the left arm. EXAM: PORTABLE CHEST 1 VIEW COMPARISON:  11/08/2013 chest radiograph. FINDINGS: Stable cardiac silhouette within normal limits given projection and technique. Aortic atherosclerosis with calcification. Clear lungs. No pleural effusion or pneumothorax. No acute osseous abnormality is evident. IMPRESSION: No acute pulmonary process identified. Aortic Atherosclerosis (ICD10-I70.0). Electronically Signed   By: Kristine Garbe M.D.   On: 01/28/2019 03:42     Assessment and Recommendation  68 y.o. male with known peripheral vascular disease essential hypertension mixed hyperlipidemia diabetes and progressive anginal symptoms over the last several weeks with acute non-ST elevation myocardial infarction and V. fib arrest yesterday now improving with appropriate therapy and some recurrent chest pain at this time but stable hemodynamically 1.  Continue medication management for further risk reduction of cardiovascular event and non-ST elevation myocardial infarction including heparin and aspirin 2.  Further consideration of adding antiplatelet medication management including Brilinta at this time due  to non-ST elevation myocardial infarction 3.  Increase beta-blocker for better heart rate control and further risk reduction in ischemic event 4.  Abstain from Eliquis at this time for concerns of bleeding complications if necessary for intervention 5.  Cardiac catheterization tomorrow morning as long as patient continues to remain stable.  Patient understands risk and benefits card catheterization this includes possibility death stroke heart attack infection bleeding blood clot.  He is at low risk for conscious sedation  Signed, Serafina Royals M.D. FACC

## 2019-01-29 NOTE — Consult Note (Signed)
CRITICAL CARE NOTE      CHIEF COMPLAINT:   Acute chest pain radiating to jaw and left shoulder   HPI    This is a pleasant 68 yo male with hx of DM, tobacco abuse, essential HTN, PE, PAD, alcoholism, fem/pop bypass, ERCP w/ sphincterotomy who came in to ED 1 day prior to admission to MICU due to acute chest pain.  He reports his pain was sharp 6-9/10 and has been going on for at least one week initially with milder symptoms.  He was seen by PMD initially appx 10d prior to admission and was treated for reflux esophagitis.  He relates that PPI trial failed and he continued to have what sounds like typical cardiac chest pain worse with exertion and alleviated by rest which subsequently evolved to CP at rest which prompted him to come to ED.  While being evaluated by ER staff patient went in to ventricular fibrillation and required defibrillation with 200J X1 with subsequent ROSC.  Cardiology was consulted and evaluated patient for NSTEMI with subsequent placement of patient on modified ACS protocol and plan for left heart catheterization.  Critical care consultation was placed to manage patient while in MICU for ongoing CP and ACS.  Today during my evaluation at appx 137pm patient is in pain pointing at his chest and able to speak in fragments while clinching teeth.  He reports pain radiating to left jaw and left shoulder. After receiving 2mg  of morphine patient reports pain went down from 9/10 to 8/10.      PAST MEDICAL HISTORY   Past Medical History:  Diagnosis Date  . Alcohol use    "cutting back" but still heavy and daily, multiple shots of bourbon each night  . Diabetes mellitus without complication ([place])   . History of tobacco use    reportedly quit in 2013  . Hypertension   . Pulmonary embolism ([place]) 2012   unprovoked  . PVD (peripheral vascular disease) ([place])      SURGICAL HISTORY   Past Surgical History:  Procedure Laterality Date  . BACK SURGERY    . CHOLECYSTECTOMY    . ERCP W/ SPHICTEROTOMY  2015  . FEMORAL ARTERY - POPLITEAL ARTERY BYPASS GRAFT Right 06/19/2018  . THROMBOENDARTERECTOMY Right 06/19/2018  . TONSILLECTOMY    . VASCULAR SURGERY       FAMILY HISTORY   History reviewed. No pertinent family history.   SOCIAL HISTORY   Social History   Tobacco Use  . Smoking status: Former Research scientist (life sciences)  . Smokeless tobacco: Never Used  Substance Use Topics  . Alcohol use: Yes    Alcohol/week: 7.0 standard drinks    Types: 7 Shots of liquor per week  . Drug use: Never     MEDICATIONS   Current Medication:  Current Facility-Administered Medications:  .  0.9 %  sodium chloride infusion, , Intravenous, Continuous, Mansy, Jan A, MD .  acetaminophen (TYLENOL) tablet 650 mg, 650 mg, Oral, Q4H PRN, Mansy, Jan A, MD, 650 mg at 01/29/19 1122 .  aspirin EC tablet 81 mg, 81 mg, Oral, Daily, Mansy, Jan A, MD, 81 mg at 01/29/19 0914 .  clonazePAM (KLONOPIN) tablet 0.25-0.5 mg, 0.25-0.5 mg, Oral, TID PRN, Vaughan Basta, MD, 0.5 mg at 01/29/19 0927 .  DULoxetine (CYMBALTA) DR capsule 60 mg, 60 mg, Oral, BID, Mansy, Jan A, MD, 60 mg at 01/29/19 0913 .  famotidine (PEPCID) tablet 40 mg, 40 mg, Oral, q morning - 10a, Mansy, Jan A, MD, 40 mg at  01/29/19 0913 .  heparin ADULT infusion 100 units/mL (25000 units/225mL sodium chloride 0.45%), 1,100 Units/hr, Intravenous, Continuous, Paticia Stack, [facility], Last Rate: 11 mL/hr at 01/29/19 1050, 1,100 Units/hr at 01/29/19 1050 .  losartan (COZAAR) tablet 100 mg, 100 mg, Oral, Daily, 100 mg at 01/29/19 0915 **AND** hydrochlorothiazide (HYDRODIURIL) tablet 25 mg, 25 mg, Oral, Daily, Vaughan Basta, MD, 25 mg at 01/29/19 0916 .  HYDROcodone-acetaminophen (NORCO/VICODIN) 5-325 MG per tablet 1 tablet, 1 tablet, Oral, Q4H PRN, Mansy, Jan A,  MD .  insulin aspart (novoLOG) injection 0-9 Units, 0-9 Units, Subcutaneous, Q4H, Mansy, Arvella Merles, MD, 1 Units at 01/29/19 0037 .  magnesium sulfate IVPB 2 g 50 mL, 2 g, Intravenous, Once, Mansy, Jan A, MD .  metoprolol tartrate (LOPRESSOR) tablet 25 mg, 25 mg, Oral, BID, Mansy, Jan A, MD, 25 mg at 01/29/19 0911 .  morphine 2 MG/ML injection 2 mg, 2 mg, Intravenous, Q2H PRN, Lanney Gins, Sherree Shankman, MD, 2 mg at 01/29/19 1346 .  nitroGLYCERIN (NITROGLYN) 2 % ointment 0.5 inch, 0.5 inch, Topical, Q6H, Mansy, Jan A, MD, 0.5 inch at 01/29/19 1123 .  nitroGLYCERIN (NITROSTAT) SL tablet 0.4 mg, 0.4 mg, Sublingual, Q5 Min x 3 PRN, Mansy, Jan A, MD .  ondansetron [facility]) injection 4 mg, 4 mg, Intravenous, Q6H PRN, Mansy, Jan A, MD, 4 mg at 01/28/19 2249 .  pantoprazole (PROTONIX) EC tablet 40 mg, 40 mg, Oral, Daily, Mansy, Jan A, MD, 40 mg at 01/29/19 0914 .  potassium chloride SA (K-DUR,KLOR-CON) CR tablet 40 mEq, 40 mEq, Oral, BID, Mansy, Jan A, MD, 40 mEq at 01/29/19 0914 .  sucralfate (CARAFATE) tablet 1 g, 1 g, Oral, TID AC & HS, Mansy, Jan A, MD, 1 g at 01/29/19 1122 .  tamsulosin (FLOMAX) capsule 0.4 mg, 0.4 mg, Oral, Daily, Mansy, Jan A, MD, 0.4 mg at 01/29/19 0916 .  zolpidem (AMBIEN) tablet 5 mg, 5 mg, Oral, QHS PRN, Mansy, Jan A, MD    ALLERGIES   Amoxicillin    REVIEW OF SYSTEMS    Unable to obtain ROS due to acutely ill state   PHYSICAL EXAMINATION   Vitals:   01/29/19 1300 01/29/19 1345  BP:    Pulse: 73 84  Resp: (!) 21 15  Temp:    SpO2: 99% 100%    GENERAL:appears to be in pain  HEAD: Normocephalic, atraumatic.  EYES: Pupils equal, round, reactive to light.  No scleral icterus.  MOUTH: Moist mucosal membrane. NECK: Supple. No thyromegaly. No nodules. No JVD.  PULMONARY: decreased breath sounds at bases bilaterally CARDIOVASCULAR: S1 and S2. Regular rate and rhythm. No murmurs, rubs, or gallops.  GASTROINTESTINAL: Soft, nontender, non-distended. No masses. Positive bowel  sounds. No hepatosplenomegaly.  MUSCULOSKELETAL: No swelling, clubbing, or edema.  NEUROLOGIC: Mild distress due to acute illness SKIN:intact,warm,dry   LABS AND IMAGING     -I personally reviewed most recent blood work, imaging and microbiology - significant findings today are mild thrombocytopenia  LAB RESULTS: Recent Labs  Lab 01/28/19 0331 01/29/19 0515  NA 139 138  K 3.0* 3.8  CL 102 102  CO2 22 26  BUN 18 22  CREATININE 1.10 1.11  GLUCOSE 147* 125*   Recent Labs  Lab 01/28/19 0331 01/29/19 0515  HGB 14.4 13.8  HCT 42.2 40.5  WBC 7.8 8.1  PLT 134* 141*     IMAGING RESULTS: No results found.    ASSESSMENT AND PLAN     Acute coronary syndrome    Due to NSTEMI    -  Cardiology on case - appreciate input - Dr Nehemiah Massed - currently on full ACS protocol   -possible LHC   - analgesia - 2mg  morphine q2h - requiring breakthrough RX   - ICU monitoring  s/p 12 Lead EKG - no STEMI  -repeat troponin pending  - Patient was re-evaluated by cardiologist at 235pm - patients chest pain has improved and plan is to wait for resolution of coagulopathy secondary to eliquis therapy prior to [place] hopefully by tommorow AM.      Active alcoholism      - on CIWA - diaphoretic but not tachycardic (on lopressor bid)     - thiamine/banana bag infusion     - monitoring sings of withdrawal      - follow [place] withdrawal protocol.     Anxiety disorder     On klonopin at home    DM   C/w weight based regimen plus ISS - monitor closely - NPO post midnight for procedure   GI/Nutrition GI PROPHYLAXIS as indicated DIET-->TF's as tolerated Constipation protocol as indicated  ENDO - ICU hypoglycemic\Hyperglycemia protocol -check FSBS per protocol    ELECTROLYTES -follow labs as needed -replace as needed -pharmacy consultation     DVT/GI PRX ordered -SCDs  TRANSFUSIONS AS NEEDED MONITOR FSBS ASSESS the need for LABS as needed   Critical care provider  statement:    Critical care time (minutes):  35   Critical care time was exclusive of:  Separately billable procedures and treating other patients   Critical care was necessary to treat or prevent imminent or life-threatening deterioration of the following conditions:  Acute coronary syndrome/NSTEMI,  GERD, chest pain, DM, alcoholism, tobacco smoking , severe PVD   Critical care was time spent personally by me on the following activities:  Development of treatment plan with patient or surrogate, discussions with consultants, evaluation of patient's response to treatment, examination of patient, obtaining history from patient or surrogate, ordering and performing treatments and interventions, ordering and review of laboratory studies and re-evaluation of patient's condition.  I assumed direction of critical care for this patient from another provider in my specialty: no      This document was prepared using Dragon voice recognition software and may include unintentional dictation errors.    Ottie Glazier, M.D.  Division of [place]

## 2019-01-29 NOTE — Progress Notes (Signed)
Patient had two episodes of chest pain this shift.  Morphine given to patient both times and was successful at easing pain.  EKG completed, troponin level drawn. Intensivist and Cardiologist both assessed patient. Patient currently resting in bed at this time.  Will continue to monitor.

## 2019-01-29 NOTE — Progress Notes (Signed)
Damon English at [place] NAME: Damon English    MR#:  [id number]  DATE OF BIRTH:  23-Nov-1950  SUBJECTIVE:  CHIEF COMPLAINT:   Chief Complaint  Patient presents with  . Chest Pain   He came with chest pain to ER.  Admitted with heparin drip by cardiologist recommendation.  He had ventricular arrhythmia this morning and required 2 to 3 minutes of CPR and reverted back to sinus rhythm after cardioversion by ER physician.  Patient was fully alert and oriented and without any complaints again.  REVIEW OF SYSTEMS:  CONSTITUTIONAL: No fever, fatigue or weakness.  EYES: No blurred or double vision.  EARS, NOSE, AND THROAT: No tinnitus or ear pain.  RESPIRATORY: No cough, shortness of breath, wheezing or hemoptysis.  CARDIOVASCULAR: No chest pain, orthopnea, edema.  GASTROINTESTINAL: No nausea, vomiting, diarrhea or abdominal pain.  GENITOURINARY: No dysuria, hematuria.  ENDOCRINE: No polyuria, nocturia,  HEMATOLOGY: No anemia, easy bruising or bleeding SKIN: No rash or lesion. MUSCULOSKELETAL: No joint pain or arthritis.   NEUROLOGIC: No tingling, numbness, weakness.  PSYCHIATRY: No anxiety or depression.   ROS  DRUG ALLERGIES:   Allergies  Allergen Reactions  . Amoxicillin Other (See Comments)    Abdominal cramps    VITALS:  Blood pressure 127/69, pulse 84, temperature 97.6 F (36.4 C), temperature source Oral, resp. rate 15, height 5\' 8"  (1.727 m), weight 93.4 kg, SpO2 100 %.  PHYSICAL EXAMINATION:  GENERAL:  68 y.o.-year-old patient lying in the bed with no acute distress.  EYES: Pupils equal, round, reactive to light and accommodation. No scleral icterus. Extraocular muscles intact.  HEENT: Head atraumatic, normocephalic. Oropharynx and nasopharynx clear.  NECK:  Supple, no jugular venous distention. No thyroid enlargement, no tenderness.  LUNGS: Normal breath sounds bilaterally, no wheezing, rales,rhonchi or crepitation. No use of  accessory muscles of respiration.  CARDIOVASCULAR: S1, S2 normal. No murmurs, rubs, or gallops.  ABDOMEN: Soft, nontender, nondistended. Bowel sounds present. No organomegaly or mass.  EXTREMITIES: No pedal edema, cyanosis, or clubbing.  NEUROLOGIC: Cranial nerves II through XII are intact. Muscle strength 5/5 in all extremities. Sensation intact. Gait not checked.  PSYCHIATRIC: The patient is alert and oriented x 3.  SKIN: No obvious rash, lesion, or ulcer.   Physical Exam LABORATORY PANEL:   CBC Recent Labs  Lab 01/29/19 0515  WBC 8.1  HGB 13.8  HCT 40.5  PLT 141*   ------------------------------------------------------------------------------------------------------------------  Chemistries  Recent Labs  Lab 01/28/19 0331 01/29/19 0515  NA 139 138  K 3.0* 3.8  CL 102 102  CO2 22 26  GLUCOSE 147* 125*  BUN 18 22  CREATININE 1.10 1.11  CALCIUM 9.1 8.9  MG 1.7  --   AST 46*  --   ALT 34  --   ALKPHOS 51  --   BILITOT 1.1  --    ------------------------------------------------------------------------------------------------------------------  Cardiac Enzymes Recent Labs  Lab 01/28/19 1621 01/28/19 2235  TROPONINI 5.22* 6.17*   ------------------------------------------------------------------------------------------------------------------  RADIOLOGY:  Dg Chest Port 1 View  Result Date: 01/28/2019 CLINICAL DATA:  68 y/o M; sharp anterior chest pain radiating down the left arm. EXAM: PORTABLE CHEST 1 VIEW COMPARISON:  11/08/2013 chest radiograph. FINDINGS: Stable cardiac silhouette within normal limits given projection and technique. Aortic atherosclerosis with calcification. Clear lungs. No pleural effusion or pneumothorax. No acute osseous abnormality is evident. IMPRESSION: No acute pulmonary process identified. Aortic Atherosclerosis (ICD10-I70.0). Electronically Signed   By: Edgardo Roys.D.  On: 01/28/2019 03:42    ASSESSMENT AND PLAN:    Active Problems:   NSTEMI (non-ST elevated myocardial infarction) (HCC)  *  Non-STEMI.  admitted to a telemetry bed.   follow serial cardiac enzymes and EKGs.  obtain a cardiology consult this a.m. for likely need of cardiac catheterization.  Dr. Nehemiah Massed was notified about the patient.The patient will be placed on aspirin as well as p.r.n. sublingual nitroglycerin and Nitropaste as well as morphine sulfate for pain.  We will continue IV heparin drip.    hold off Eliquis for now which she takes for history of unprovoked PE since he will be on IV heparin.  Cardio suggested to wait 48 hrs for cath due to eliquis.  *  Ventricular tachycardia Cardioverted by ER. Patient and his wife wanted him to get transferred to [facility] as his primary cardiologist and vascular surgeons are at [facility]. I spoke to cardiologist there but they suggested as he had cardiac arrhythmia and he was on Eliquis they would be also just monitoring him for at least 24 hours and he is at high risk of having another arrhythmia event during transportation so suggested to just keep him here.  *  Hypokalemia.  Potassium will be replaced and magnesium level will be optimized.  *  Hypertension.  We will continue Cozaar and Lopressor.  *  GERD.  PPI therapy and Carafate will be resumed.  *  Type 2 diabetes mellitus.  The patient will be placed on supplemental coverage with NovoLog.  *  BPH.  Flomax will be resumed.  *  Peripheral neuropathy and peripheral vascular disease.  Neurontin will be continued.  Eliquis will be held and he will be placed on aspirin.  *  DVT prophylaxis.  This is covered with IV heparin drip and GI prophylaxis was addressed above.    All the records are reviewed and case discussed with Care Management/Social Workerr. Management plans discussed with the patient, family and they are in agreement.  CODE STATUS: Full.  TOTAL TIME TAKING CARE OF THIS PATIENT: 60 critical care minutes.   Spoke to  patient's wife multiple times.  Spoke to [employer] transfer center, ER physician, ICU physician.  Discussed with them about his cardiac arrhythmia event and further treatment plans.  POSSIBLE D/C IN 1-2 DAYS, DEPENDING ON CLINICAL CONDITION.   Vaughan Basta M.D on 01/29/2019   Between 7am to 6pm - Pager - [phone]  After 6pm go to [email or web address] - password [email or web address] [place] [facility]  Office  [phone]  CC: Primary care physician; [name], PA-C  Note: This dictation was prepared with Dragon dictation along with smaller phrase technology. Any transcriptional errors that result from this process are unintentional.

## 2019-01-29 NOTE — Progress Notes (Signed)
[place] [facility] [place] NAME: Damon English    MR#:  [id number]  DATE OF BIRTH:  1951-09-10  SUBJECTIVE:  CHIEF COMPLAINT:   Chief Complaint  Patient presents with  . Chest Pain   He came with chest pain to ER.  Admitted with heparin drip by cardiologist recommendation.  He had ventricular arrhythmia this morning and required 2 to 3 minutes of CPR and reverted back to sinus rhythm after cardioversion by ER physician.  Patient was fully alert and oriented and without any complaints again. Today he had some chest pain which resolved after receiving morphine injections.  REVIEW OF SYSTEMS:  CONSTITUTIONAL: No fever, fatigue or weakness.  EYES: No blurred or double vision.  EARS, NOSE, AND THROAT: No tinnitus or ear pain.  RESPIRATORY: No cough, shortness of breath, wheezing or hemoptysis.  CARDIOVASCULAR: No chest pain, orthopnea, edema.  GASTROINTESTINAL: No nausea, vomiting, diarrhea or abdominal pain.  GENITOURINARY: No dysuria, hematuria.  ENDOCRINE: No polyuria, nocturia,  HEMATOLOGY: No anemia, easy bruising or bleeding SKIN: No rash or lesion. MUSCULOSKELETAL: No joint pain or arthritis.   NEUROLOGIC: No tingling, numbness, weakness.  PSYCHIATRY: No anxiety or depression.   ROS  DRUG ALLERGIES:   Allergies  Allergen Reactions  . Amoxicillin Other (See Comments)    Abdominal cramps    VITALS:  Blood pressure 112/62, pulse 77, temperature 97.6 F (36.4 C), temperature source Axillary, resp. rate 18, height 5\' 8"  (1.727 m), weight 93.4 kg, SpO2 99 %.  PHYSICAL EXAMINATION:  GENERAL:  68 y.o.-year-old patient lying in the bed with no acute distress.  EYES: Pupils equal, round, reactive to light and accommodation. No scleral icterus. Extraocular muscles intact.  HEENT: Head atraumatic, normocephalic. Oropharynx and nasopharynx clear.  NECK:  Supple, no jugular venous distention. No thyroid enlargement, no tenderness.  LUNGS:  Normal breath sounds bilaterally, no wheezing, rales,rhonchi or crepitation. No use of accessory muscles of respiration.  CARDIOVASCULAR: S1, S2 normal. No murmurs, rubs, or gallops.  ABDOMEN: Soft, nontender, nondistended. Bowel sounds present. No organomegaly or mass.  EXTREMITIES: No pedal edema, cyanosis, or clubbing.  NEUROLOGIC: Cranial nerves II through XII are intact. Muscle strength 5/5 in all extremities. Sensation intact. Gait not checked.  PSYCHIATRIC: The patient is alert and oriented x 3.  SKIN: No obvious rash, lesion, or ulcer.   Physical Exam LABORATORY PANEL:   CBC Recent Labs  Lab 01/29/19 0515  WBC 8.1  HGB 13.8  HCT 40.5  PLT 141*   ------------------------------------------------------------------------------------------------------------------  Chemistries  Recent Labs  Lab 01/28/19 0331 01/29/19 0515  NA 139 138  K 3.0* 3.8  CL 102 102  CO2 22 26  GLUCOSE 147* 125*  BUN 18 22  CREATININE 1.10 1.11  CALCIUM 9.1 8.9  MG 1.7  --   AST 46*  --   ALT 34  --   ALKPHOS 51  --   BILITOT 1.1  --    ------------------------------------------------------------------------------------------------------------------  Cardiac Enzymes Recent Labs  Lab 01/29/19 0515 01/29/19 1420  TROPONINI 4.75* 2.77*   ------------------------------------------------------------------------------------------------------------------  RADIOLOGY:  Dg Chest Port 1 View  Result Date: 01/28/2019 CLINICAL DATA:  68 y/o M; sharp anterior chest pain radiating down the left arm. EXAM: PORTABLE CHEST 1 VIEW COMPARISON:  11/08/2013 chest radiograph. FINDINGS: Stable cardiac silhouette within normal limits given projection and technique. Aortic atherosclerosis with calcification. Clear lungs. No pleural effusion or pneumothorax. No acute osseous abnormality is evident. IMPRESSION: No acute pulmonary process identified. Aortic Atherosclerosis (  ICD10-I70.0). Electronically Signed    By: Kristine Garbe M.D.   On: 01/28/2019 03:42    ASSESSMENT AND PLAN:   Active Problems:   NSTEMI (non-ST elevated myocardial infarction) (HCC)  *  Non-STEMI.  admitted to a telemetry bed.   follow serial cardiac enzymes and EKGs.  obtain a cardiology consult this a.m. for likely need of cardiac catheterization.  Dr. Nehemiah Massed was notified about the patient.The patient will be placed on aspirin as well as p.r.n. sublingual nitroglycerin and Nitropaste as well as morphine sulfate for pain.  We will continue IV heparin drip.    hold off Eliquis for now which she takes for history of unprovoked PE since he will be on IV heparin.  Cardio suggested to wait 48 hrs for cath due to eliquis. Catheterization planned tomorrow morning. He had anginal chest pain today which resolved after receiving morphine injection.  Troponin elevated.  *  Ventricular tachycardia Cardioverted by ER. Patient and his wife wanted him to get transferred to [facility] as his primary cardiologist and vascular surgeons are at [facility]. I spoke to cardiologist there but they suggested as he had cardiac arrhythmia and he was on Eliquis they would be also just monitoring him for at least 24 hours and he is at high risk of having another arrhythmia event during transportation so suggested to just keep him here.  *  Hypokalemia.  Potassium will be replaced and magnesium level will be optimized.  *  Hypertension.  We will continue Cozaar and Lopressor.  *  GERD.  PPI therapy and Carafate will be resumed.  *  Type 2 diabetes mellitus.  The patient will be placed on supplemental coverage with NovoLog.  *  BPH.  Flomax will be resumed.  *  Peripheral neuropathy and peripheral vascular disease.  Neurontin will be continued.  Eliquis will be held and he will be placed on aspirin.  *  DVT prophylaxis.  This is covered with IV heparin drip and GI prophylaxis was addressed above.   All the records are reviewed and case  discussed with Care Management/Social Workerr. Management plans discussed with the patient, family and they are in agreement.  CODE STATUS: Full.  TOTAL TIME TAKING CARE OF THIS PATIENT: 60 critical care minutes.   Spoke to patient's wife multiple times.   POSSIBLE D/C IN 1-2 DAYS, DEPENDING ON CLINICAL CONDITION.   Vaughan Basta M.D on 01/29/2019   Between 7am to 6pm - Pager - [phone]  After 6pm go to [email or web address] - password [email or web address] [facility]  Office  [phone]  CC: Primary care physician; [name], PA-C  Note: This dictation was prepared with Dragon dictation along with smaller phrase technology. Any transcriptional errors that result from this process are unintentional.

## 2019-01-30 ENCOUNTER — Encounter
Admission: EM | Disposition: A | Discharge status: Other Acute Inpatient Hospital | Payer: Self-pay | Source: Home / Self Care | Attending: Internal Medicine

## 2019-01-30 DIAGNOSIS — I200 Unstable angina: Secondary | ICD-10-CM | POA: Diagnosis not present

## 2019-01-30 DIAGNOSIS — J9811 Atelectasis: Secondary | ICD-10-CM | POA: Diagnosis not present

## 2019-01-30 DIAGNOSIS — I5023 Acute on chronic systolic (congestive) heart failure: Secondary | ICD-10-CM | POA: Diagnosis not present

## 2019-01-30 DIAGNOSIS — E1151 Type 2 diabetes mellitus with diabetic peripheral angiopathy without gangrene: Secondary | ICD-10-CM | POA: Diagnosis not present

## 2019-01-30 DIAGNOSIS — I442 Atrioventricular block, complete: Secondary | ICD-10-CM | POA: Diagnosis not present

## 2019-01-30 DIAGNOSIS — I517 Cardiomegaly: Secondary | ICD-10-CM | POA: Diagnosis not present

## 2019-01-30 DIAGNOSIS — D62 Acute posthemorrhagic anemia: Secondary | ICD-10-CM | POA: Diagnosis not present

## 2019-01-30 DIAGNOSIS — I255 Ischemic cardiomyopathy: Secondary | ICD-10-CM | POA: Diagnosis not present

## 2019-01-30 DIAGNOSIS — J948 Other specified pleural conditions: Secondary | ICD-10-CM | POA: Diagnosis not present

## 2019-01-30 DIAGNOSIS — M48061 Spinal stenosis, lumbar region without neurogenic claudication: Secondary | ICD-10-CM | POA: Diagnosis not present

## 2019-01-30 DIAGNOSIS — I739 Peripheral vascular disease, unspecified: Secondary | ICD-10-CM | POA: Diagnosis not present

## 2019-01-30 DIAGNOSIS — J939 Pneumothorax, unspecified: Secondary | ICD-10-CM | POA: Diagnosis not present

## 2019-01-30 DIAGNOSIS — E854 Organ-limited amyloidosis: Secondary | ICD-10-CM | POA: Diagnosis not present

## 2019-01-30 DIAGNOSIS — I110 Hypertensive heart disease with heart failure: Secondary | ICD-10-CM | POA: Diagnosis not present

## 2019-01-30 DIAGNOSIS — M1990 Unspecified osteoarthritis, unspecified site: Secondary | ICD-10-CM | POA: Diagnosis not present

## 2019-01-30 DIAGNOSIS — I214 Non-ST elevation (NSTEMI) myocardial infarction: Secondary | ICD-10-CM | POA: Diagnosis not present

## 2019-01-30 DIAGNOSIS — N179 Acute kidney failure, unspecified: Secondary | ICD-10-CM | POA: Diagnosis not present

## 2019-01-30 DIAGNOSIS — Z951 Presence of aortocoronary bypass graft: Secondary | ICD-10-CM | POA: Diagnosis not present

## 2019-01-30 DIAGNOSIS — I2510 Atherosclerotic heart disease of native coronary artery without angina pectoris: Secondary | ICD-10-CM | POA: Diagnosis not present

## 2019-01-30 DIAGNOSIS — R931 Abnormal findings on diagnostic imaging of heart and coronary circulation: Secondary | ICD-10-CM | POA: Diagnosis not present

## 2019-01-30 DIAGNOSIS — T8111XA Postprocedural  cardiogenic shock, initial encounter: Secondary | ICD-10-CM | POA: Diagnosis not present

## 2019-01-30 DIAGNOSIS — I208 Other forms of angina pectoris: Secondary | ICD-10-CM | POA: Diagnosis not present

## 2019-01-30 DIAGNOSIS — J982 Interstitial emphysema: Secondary | ICD-10-CM | POA: Diagnosis not present

## 2019-01-30 DIAGNOSIS — I4901 Ventricular fibrillation: Secondary | ICD-10-CM | POA: Diagnosis not present

## 2019-01-30 DIAGNOSIS — H409 Unspecified glaucoma: Secondary | ICD-10-CM | POA: Diagnosis not present

## 2019-01-30 DIAGNOSIS — J90 Pleural effusion, not elsewhere classified: Secondary | ICD-10-CM | POA: Diagnosis not present

## 2019-01-30 DIAGNOSIS — R918 Other nonspecific abnormal finding of lung field: Secondary | ICD-10-CM | POA: Diagnosis not present

## 2019-01-30 DIAGNOSIS — I63512 Cerebral infarction due to unspecified occlusion or stenosis of left middle cerebral artery: Secondary | ICD-10-CM | POA: Diagnosis not present

## 2019-01-30 DIAGNOSIS — Z9889 Other specified postprocedural states: Secondary | ICD-10-CM | POA: Diagnosis not present

## 2019-01-30 DIAGNOSIS — K219 Gastro-esophageal reflux disease without esophagitis: Secondary | ICD-10-CM | POA: Diagnosis not present

## 2019-01-30 DIAGNOSIS — I619 Nontraumatic intracerebral hemorrhage, unspecified: Secondary | ICD-10-CM | POA: Diagnosis not present

## 2019-01-30 DIAGNOSIS — I680 Cerebral amyloid angiopathy: Secondary | ICD-10-CM | POA: Diagnosis not present

## 2019-01-30 DIAGNOSIS — I639 Cerebral infarction, unspecified: Secondary | ICD-10-CM | POA: Diagnosis not present

## 2019-01-30 DIAGNOSIS — E872 Acidosis: Secondary | ICD-10-CM | POA: Diagnosis not present

## 2019-01-30 DIAGNOSIS — I5022 Chronic systolic (congestive) heart failure: Secondary | ICD-10-CM | POA: Diagnosis not present

## 2019-01-30 DIAGNOSIS — E785 Hyperlipidemia, unspecified: Secondary | ICD-10-CM | POA: Diagnosis not present

## 2019-01-30 DIAGNOSIS — I10 Essential (primary) hypertension: Secondary | ICD-10-CM | POA: Diagnosis not present

## 2019-01-30 HISTORY — PX: CORONARY ANGIOGRAPHY: CATH118303

## 2019-01-30 LAB — BASIC METABOLIC PANEL
Anion gap: 8 (ref 5–15)
BUN: 26 mg/dL — ABNORMAL HIGH (ref 8–23)
CO2: 24 mmol/L (ref 22–32)
Calcium: 8.4 mg/dL — ABNORMAL LOW (ref 8.9–10.3)
Chloride: 104 mmol/L (ref 98–111)
Creatinine, Ser: 1.39 mg/dL — ABNORMAL HIGH (ref 0.61–1.24)
GFR calc Af Amer: >60 mL/min (ref >60)
GFR calc non Af Amer: 53 mL/min — ABNORMAL LOW (ref >60)
Glucose, Bld: 140 mg/dL — ABNORMAL HIGH (ref 70–99)
Potassium: 3.7 mmol/L (ref 3.5–5.1)
Sodium: 136 mmol/L (ref 135–145)

## 2019-01-30 LAB — APTT
aPTT: 137 seconds — ABNORMAL HIGH (ref 24–36)
aPTT: 88 seconds — ABNORMAL HIGH (ref 24–36)

## 2019-01-30 LAB — GLUCOSE, CAPILLARY
Glucose-Capillary: 137 mg/dL — ABNORMAL HIGH (ref 70–99)
Glucose-Capillary: 116 mg/dL — ABNORMAL HIGH (ref 70–99)
Glucose-Capillary: 107 mg/dL — ABNORMAL HIGH (ref 70–99)
Glucose-Capillary: 104 mg/dL — ABNORMAL HIGH (ref 70–99)
Glucose-Capillary: 108 mg/dL — ABNORMAL HIGH (ref 70–99)

## 2019-01-30 LAB — CBC
HCT: 41.1 % (ref 39.0–52.0)
Hemoglobin: 14 g/dL (ref 13.0–17.0)
MCH: 35.4 pg — ABNORMAL HIGH (ref 26.0–34.0)
MCHC: 34.1 g/dL (ref 30.0–36.0)
MCV: 104.1 fL — ABNORMAL HIGH (ref 80.0–100.0)
Platelets: 133 10*3/uL — ABNORMAL LOW (ref 150–400)
RBC: 3.95 MIL/uL — ABNORMAL LOW (ref 4.22–5.81)
RDW: 12.9 % (ref 11.5–15.5)
WBC: 7.2 10*3/uL (ref 4.0–10.5)
nRBC: 0 % (ref 0.0–0.2)

## 2019-01-30 SURGERY — CORONARY ANGIOGRAPHY (CATH LAB)

## 2019-01-30 MED ORDER — SODIUM CHLORIDE 0.9 % IV SOLN: 250.0000 mL | INTRAVENOUS | Status: DC | PRN

## 2019-01-30 MED ORDER — HEPARIN (PORCINE) 25000 UT/250ML-% IV SOLN: 750.0000 [IU]/h | INTRAVENOUS | 0 refills | Status: DC

## 2019-01-30 MED ORDER — ASPIRIN 81 MG PO CHEW
81.0000 mg | CHEWABLE_TABLET | ORAL | Status: AC
  Administered 2019-01-30: 81 mg via ORAL

## 2019-01-30 MED ORDER — SODIUM CHLORIDE 0.9 % WEIGHT BASED INFUSION: 1.0000 mL/kg/h | INTRAVENOUS | Status: DC

## 2019-01-30 MED ORDER — HYDRALAZINE HCL 20 MG/ML IJ SOLN: 10.0000 mg | INTRAMUSCULAR | Status: AC | PRN

## 2019-01-30 MED ORDER — SODIUM CHLORIDE 0.9% FLUSH
3.0000 mL | Freq: Two times a day (BID) | INTRAVENOUS | Status: DC
  Administered 2019-01-30: 12:00:00 3 mL via INTRAVENOUS

## 2019-01-30 MED ORDER — SODIUM CHLORIDE 0.9 % WEIGHT BASED INFUSION: 3.0000 mL/kg/h | INTRAVENOUS | Status: DC

## 2019-01-30 MED ORDER — FENTANYL CITRATE (PF) 100 MCG/2ML IJ SOLN
INTRAMUSCULAR | Status: AC
  Filled 2019-01-30: qty 2

## 2019-01-30 MED ORDER — HEPARIN SODIUM (PORCINE) 1000 UNIT/ML IJ SOLN
INTRAMUSCULAR | Status: DC | PRN
  Administered 2019-01-30: 4500 [IU] via INTRAVENOUS

## 2019-01-30 MED ORDER — FENTANYL CITRATE (PF) 100 MCG/2ML IJ SOLN
INTRAMUSCULAR | Status: DC | PRN
  Administered 2019-01-30 (×2): 25 ug via INTRAVENOUS

## 2019-01-30 MED ORDER — VERAPAMIL HCL 2.5 MG/ML IV SOLN
INTRAVENOUS | Status: DC | PRN
  Administered 2019-01-30: 2.5 mg via INTRAVENOUS

## 2019-01-30 MED ORDER — ACETAMINOPHEN 325 MG PO TABS: 650.0000 mg | ORAL_TABLET | ORAL | Status: DC | PRN

## 2019-01-30 MED ORDER — ASPIRIN 81 MG PO CHEW
CHEWABLE_TABLET | ORAL | Status: AC
  Filled 2019-01-30: qty 1

## 2019-01-30 MED ORDER — LABETALOL HCL 5 MG/ML IV SOLN: 10.0000 mg | INTRAVENOUS | Status: AC | PRN

## 2019-01-30 MED ORDER — HEPARIN (PORCINE) IN NACL 1000-0.9 UT/500ML-% IV SOLN
INTRAVENOUS | Status: AC
  Filled 2019-01-30: qty 1000

## 2019-01-30 MED ORDER — DULOXETINE HCL 60 MG PO CPEP: 60.0000 mg | ORAL_CAPSULE | Freq: Two times a day (BID) | ORAL | 0 refills | Status: DC

## 2019-01-30 MED ORDER — POTASSIUM CHLORIDE CRYS ER 20 MEQ PO TBCR: 40.0000 meq | EXTENDED_RELEASE_TABLET | Freq: Every day | ORAL | Status: DC

## 2019-01-30 MED ORDER — ACETAMINOPHEN 325 MG PO TABS
ORAL_TABLET | ORAL | Status: AC
  Filled 2019-01-30: qty 2

## 2019-01-30 MED ORDER — VERAPAMIL HCL 2.5 MG/ML IV SOLN
INTRAVENOUS | Status: AC
  Filled 2019-01-30: qty 2

## 2019-01-30 MED ORDER — ONDANSETRON HCL 4 MG/2ML IJ SOLN: 4.0000 mg | Freq: Four times a day (QID) | INTRAMUSCULAR | Status: DC | PRN

## 2019-01-30 MED ORDER — SODIUM CHLORIDE 0.9% FLUSH: 3.0000 mL | INTRAVENOUS | Status: DC | PRN

## 2019-01-30 MED ORDER — MIDAZOLAM HCL 2 MG/2ML IJ SOLN
INTRAMUSCULAR | Status: DC | PRN
  Administered 2019-01-30 (×2): 1 mg via INTRAVENOUS

## 2019-01-30 MED ORDER — MIDAZOLAM HCL 2 MG/2ML IJ SOLN
INTRAMUSCULAR | Status: AC
  Filled 2019-01-30: qty 2

## 2019-01-30 MED ORDER — HEPARIN SODIUM (PORCINE) 1000 UNIT/ML IJ SOLN
INTRAMUSCULAR | Status: AC
  Filled 2019-01-30: qty 1

## 2019-01-30 SURGICAL SUPPLY — 10 items
CATH INFINITI 5 FR JL3.5 (CATHETERS) ×2 IMPLANT
CATH INFINITI 5FR JK (CATHETERS) ×2 IMPLANT
CATH INFINITI 5FR JL4 (CATHETERS) ×2 IMPLANT
CATH INFINITI JR4 5F (CATHETERS) ×2 IMPLANT
CATH LAUNCHER 5F EBU3.5 (CATHETERS) ×2 IMPLANT
DEVICE RAD TR BAND REGULAR (VASCULAR PRODUCTS) ×2 IMPLANT
GLIDESHEATH SLEND SS 6F .021 (SHEATH) ×2 IMPLANT
KIT MANI 3VAL PERCEP (MISCELLANEOUS) ×2 IMPLANT
PACK CARDIAC CATH (CUSTOM PROCEDURE TRAY) ×2 IMPLANT
WIRE ROSEN-J .035X260CM (WIRE) ×2 IMPLANT

## 2019-01-30 NOTE — Discharge Summary (Signed)
[place] at [place] NAME: Damon English    MR#:  [id number]  DATE OF BIRTH:  [birth year]  DATE OF ADMISSION:  01/28/2019 ADMITTING PHYSICIAN: Vaughan Basta, MD  DATE OF DISCHARGE: 01/30/2019   PRIMARY CARE PHYSICIAN: [name], PA-C    ADMISSION DIAGNOSIS:  Hypokalemia [E87.6] Alcohol use [Z72.89] NSTEMI (non-ST elevated myocardial infarction) ([place]) [I21.4] Current use of long term anticoagulation [Z79.01]  DISCHARGE DIAGNOSIS:  Active Problems:   NSTEMI (non-ST elevated myocardial infarction) ([place])   SECONDARY DIAGNOSIS:   Past Medical History:  Diagnosis Date  . Alcohol use    "cutting back" but still heavy and daily, multiple shots of bourbon each night  . Diabetes mellitus without complication ([place])   . History of tobacco use    reportedly quit in 2013  . Hypertension   . Pulmonary embolism ([place]) 2012   unprovoked  . PVD (peripheral vascular disease) ([place])     HOSPITAL COURSE:   * Non-STEMI. admitted to a telemetry bed.   follow serial cardiac enzymes and EKGs.  obtain a cardiology consultthisa.m. forlikely need of cardiac catheterization. Dr. Nehemiah Massed was notified about the patient.The patient will be placed on aspirin as well as p.r.n. sublingual nitroglycerinand Nitropaste as well asmorphine sulfate for pain.We will continue IV heparin drip.   hold off Eliquis for nowwhich she takes for history of unprovoked PE since he will be on IV heparin.  Cardio suggested to wait 48 hrs for cath due to eliquis. Catheterization planned tomorrow morning. He had anginal chest pain today which resolved after receiving morphine injection.  Troponin elevated. cath done- 01/30/19-  Transfer to [facility] for further evaluation and treatment options for significant ostial LAD stenosis and further intervention.  Discussed with family and the patient who wishes to proceed with this at  this time  *  Ventricular tachycardia Cardioverted by ER. Patient and his wife wanted him to get transferred to [facility] as his primary cardiologist and vascular surgeons are at [facility]. I spoke to cardiologist there but they suggested as he had cardiac arrhythmia and he was on Eliquis they would be also just monitoring him for at least 24 hours and he is at high risk of having another arrhythmia event during transportation so suggested to just keep him here.  * Hypokalemia.Potassium will be replaced and magnesium level will be optimized.  * Hypertension. We will continue Cozaar and Lopressor.  * GERD.PPI therapy and Carafate will be resumed.  * Type 2 diabetes mellitus. The patient will be placed on supplemental coverage with NovoLog.  * BPH. Flomax will be resumed.  *Peripheral neuropathyand peripheral vascular disease. Neurontin will be continued.Eliquis will be held and he will be placed on aspirin.  * DVT prophylaxis. This is covered with IV heparin drip and GI prophylaxis was addressed above.    DISCHARGE CONDITIONS:   Stable.  CONSULTS OBTAINED:    DRUG ALLERGIES:   Allergies  Allergen Reactions  . Amoxicillin Other (See Comments)    Abdominal cramps    DISCHARGE MEDICATIONS:   Allergies as of 01/30/2019      Reactions   Amoxicillin Other (See Comments)   Abdominal cramps      Medication List    STOP taking these medications   Eliquis 5 MG Tabs tablet Generic drug:  apixaban     TAKE these medications   clonazePAM 0.5 MG tablet Commonly known as:  KLONOPIN Take 0.25-0.5 mg by mouth 3 (three) times  daily as needed for anxiety.   DULoxetine 60 MG capsule Commonly known as:  CYMBALTA Take 1 capsule (60 mg total) by mouth 2 (two) times daily.   esomeprazole 40 MG capsule Commonly known as:  NEXIUM Take 1 capsule by mouth daily.   famotidine 40 MG tablet Commonly known as:  PEPCID Take 1 tablet by mouth every morning.    heparin 25000-0.45 UT/250ML-% infusion Inject 750 Units/hr into the vein continuous.   HYDROcodone-acetaminophen 5-325 MG tablet Commonly known as:  NORCO/VICODIN Take 0.5-1 tablets by mouth 2 (two) times daily as needed for pain.   losartan-hydrochlorothiazide 100-25 MG tablet Commonly known as:  HYZAAR Take 0.5 tablets by mouth 2 (two) times daily.   metoprolol succinate 25 MG 24 hr tablet Commonly known as:  TOPROL-XL Take 1 tablet by mouth daily.   sucralfate 1 g tablet Commonly known as:  CARAFATE Take 1 tablet by mouth 4 (four) times daily -  before meals and at bedtime.        DISCHARGE INSTRUCTIONS:    Follow as advised by [place] cardiologist.  If you experience worsening of your admission symptoms, develop shortness of breath, life threatening emergency, suicidal or homicidal thoughts you must seek medical attention immediately by calling 911 or calling your MD immediately  if symptoms less severe.  You Must read complete instructions/literature along with all the possible adverse reactions/side effects for all the Medicines you take and that have been prescribed to you. Take any new Medicines after you have completely understood and accept all the possible adverse reactions/side effects.   Please note  You were cared for by a hospitalist during your hospital stay. If you have any questions about your discharge medications or the care you received while you were in the hospital after you are discharged, you can call the unit and asked to speak with the hospitalist on call if the hospitalist that took care of you is not available. Once you are discharged, your primary care physician will handle any further medical issues. Please note that NO REFILLS for any discharge medications will be authorized once you are discharged, as it is imperative that you return to your primary care physician (or establish a relationship with a primary care physician if you do not have one) for  your aftercare needs so that they can reassess your need for medications and monitor your lab values.    Today   CHIEF COMPLAINT:   Chief Complaint  Patient presents with  . Chest Pain    HISTORY OF PRESENT ILLNESS:  Damon English  is a 68 y.o. male with a known history of multiple medical problems that will be mentioned below, who presented to the emergency room with acute onset of midsternal chest pain felt as pressure and sharp pain that woke him up at 12:30 AM with radiation to his left arm.  He had similar pain about a week ago that lasted about 5 to 10 minutes.  He stated that he would take Tums and breathe deep and it would resolve.  Tonight he was given 4 baby aspirin by EMS and had taken 1 Norco prior to leaving home.  He was pain-free by the time he came to the ER.  He denied any nausea or vomiting or abdominal pain.  No bleeding diathesis.  He has chronic right leg swelling with history of peripheral vascular disease status post femoral-popliteal bypass.  He also has chronic right foot numbness.  No dysuria, oliguria or hematuria or flank  pain.  No cough or wheezing or hemoptysis.  No recent sick exposure or travel outside of the state or the country.  Upon presentation to the emergency room, blood pressure was 152/78 with a pulse of 112, respiratory to 26 with a pulse 79 9% on room air and temperature 98.4.  EKG showed sinus tachycardia with rate 106 with Q waves in V1 and V2 and ST segment depression that is about 1 mm laterally in V4 through V6 as well as 1 and aVL.  Labs were remarkable for hypokalemia of 3 mild hypomagnesemia of 1.7 with anion gap of 15 glucose of 147.  Troponin I was elevated at 2.29 from first set and CBC showed macrocytosis.  INR was 1.3 and PTT 16.1.  Portable chest x-ray showed no acute cardiopulmonary disease.  It did show aortic atherosclerosis.  The patient was started on IV heparin and was given 40 mEq p.o. potassium chloride.  Dr. Nehemiah Massed was notified  about the patient.  The patient will be admitted to a telemetry bed for further evaluation and management.   VITAL SIGNS:  Blood pressure 93/67, pulse 66, temperature 97.7 F (36.5 C), temperature source Oral, resp. rate 20, height 5\' 8"  (1.727 m), weight 93.4 kg, SpO2 93 %.  I/O:    Intake/Output Summary (Last 24 hours) at 01/30/2019 1600 Last data filed at 01/30/2019 1509 Gross per 24 hour  Intake 1889.63 ml  Output 900 ml  Net 989.63 ml    PHYSICAL EXAMINATION:  GENERAL:  68 y.o.-year-old patient lying in the bed with no acute distress.  EYES: Pupils equal, round, reactive to light and accommodation. No scleral icterus. Extraocular muscles intact.  HEENT: Head atraumatic, normocephalic. Oropharynx and nasopharynx clear.  NECK:  Supple, no jugular venous distention. No thyroid enlargement, no tenderness.  LUNGS: Normal breath sounds bilaterally, no wheezing, rales,rhonchi or crepitation. No use of accessory muscles of respiration.  CARDIOVASCULAR: S1, S2 normal. No murmurs, rubs, or gallops.  ABDOMEN: Soft, non-tender, non-distended. Bowel sounds present. No organomegaly or mass.  EXTREMITIES: No pedal edema, cyanosis, or clubbing.  NEUROLOGIC: Cranial nerves II through XII are intact. Muscle strength 5/5 in all extremities. Sensation intact. Gait not checked.  PSYCHIATRIC: The patient is alert and oriented x 3.  SKIN: No obvious rash, lesion, or ulcer.   DATA REVIEW:   CBC Recent Labs  Lab 01/30/19 0652  WBC 7.2  HGB 14.0  HCT 41.1  PLT 133*    Chemistries  Recent Labs  Lab 01/28/19 0331  01/30/19 0652  NA 139   < > 136  K 3.0*   < > 3.7  CL 102   < > 104  CO2 22   < > 24  GLUCOSE 147*   < > 140*  BUN 18   < > 26*  CREATININE 1.10   < > 1.39*  CALCIUM 9.1   < > 8.4*  MG 1.7  --   --   AST 46*  --   --   ALT 34  --   --   ALKPHOS 51  --   --   BILITOT 1.1  --   --    < > = values in this interval not displayed.    Cardiac Enzymes Recent Labs  Lab  01/29/19 1420  TROPONINI 2.77*    Microbiology Results  Results for orders placed or performed during the hospital encounter of 01/28/19  MRSA PCR Screening     Status: None   Collection Time: 01/29/19  2:57 PM  Result Value Ref Range Status   MRSA by PCR NEGATIVE NEGATIVE Final    Comment:        The GeneXpert MRSA Assay (FDA approved for NASAL specimens only), is one component of a comprehensive MRSA colonization surveillance program. It is not intended to diagnose MRSA infection nor to guide or monitor treatment for MRSA infections. Performed at [facility], [address] [place], [place] [address]     RADIOLOGY:  No results found.  EKG:   Orders placed or performed during the hospital encounter of 01/28/19  . ED EKG  . ED EKG  . EKG 12-Lead  . EKG 12-Lead  . EKG 12-lead  . EKG 12-Lead  . EKG 12-Lead  . EKG  . EKG 12-Lead  . EKG 12-Lead      Management plans discussed with the patient, family and they are in agreement.  CODE STATUS:     Code Status Orders  (From admission, onward)         Start     Ordered   01/28/19 0602  Full code  Continuous     01/28/19 0608        Code Status History    This patient has a current code status but no historical code status.      TOTAL TIME TAKING CARE OF THIS PATIENT: 35 minutes.    Vaughan Basta M.D on 01/30/2019 at 4:00 PM  Between 7am to 6pm - Pager - [phone]  After 6pm go to [email or web address] - password [email or web address] [facility]  Office  [phone]  CC: Primary care physician; [name], PA-C   Note: This dictation was prepared with Dragon dictation along with smaller phrase technology. Any transcriptional errors that result from this process are unintentional.

## 2019-01-30 NOTE — Progress Notes (Signed)
ANTICOAGULATION CONSULT NOTE  Pharmacy Consult for heparin Indication: chest pain/ACS  Patient Measurements: Height: 5\' 8"  (172.7 cm) Weight: 206 lb (93.4 kg) IBW/kg (Calculated) : 68.4 Heparin Dosing Weight: 87.9 kg  Vital Signs: Temp: 97.9 F (36.6 C) (04/14 2000) Temp Source: Oral (04/14 2000) BP: 109/67 (04/14 2000) Pulse Rate: 66 (04/14 2000)  Labs: Recent Labs    01/28/19 0331 01/28/19 0426  01/28/19 2235 01/29/19 0515 01/29/19 1420 01/29/19 2346  HGB 14.4  --   --   --  13.8  --   --   HCT 42.2  --   --   --  40.5  --   --   PLT 134*  --   --   --  141*  --   --   APTT  --  31   < > 98* 125* 129* 137*  LABPROT  --  16.1*  --   --  15.8*  --   --   INR  --  1.3*  --   --  1.3*  --   --   HEPARINUNFRC  --  3.06*  --   --  1.75*  --   --   CREATININE 1.10  --   --   --  1.11  --   --   TROPONINI  --   --    < > 6.17* 4.75* 2.77*  --    < > = values in this interval not displayed.    Estimated Creatinine Clearance: 73.6 mL/min (by C-G formula based on SCr of 1.11 mg/dL).   Medical History: Past Medical History:  Diagnosis Date  . Alcohol use    "cutting back" but still heavy and daily, multiple shots of bourbon each night  . Diabetes mellitus without complication ([place])   . History of tobacco use    reportedly quit in 2013  . Hypertension   . Pulmonary embolism ([place]) 2012   unprovoked  . PVD (peripheral vascular disease) (HCC)     Medications:  Scheduled:  . DULoxetine  60 mg Oral BID  . famotidine  40 mg Oral q morning - 10a  . losartan  100 mg Oral Daily   And  . hydrochlorothiazide  25 mg Oral Daily  . insulin aspart  0-9 Units Subcutaneous Q4H  . metoprolol tartrate  50 mg Oral BID  . nitroGLYCERIN  1 inch Topical Q6H  . pantoprazole  40 mg Oral Daily  . potassium chloride  40 mEq Oral BID  . sucralfate  1 g Oral TID AC & HS  . tamsulosin  0.4 mg Oral Daily  . thiamine injection  100 mg Intravenous Daily    Assessment: Patient arrives to  ED for CP radiating down L arm after being awoken from sleep. Initial trop on arrival was 2.29. 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 Patient is being started on heparin drip for NSTEMI. CBC stable, wnl  Goal of Therapy:  Heparin level 0.3-0.7 units/ml Monitor platelets by anticoagulation protocol: Yes  APTT goal: 66-102 seconds  Heparin Course: 4/13 AM initiation: started at 1100 units/hr 4/13 1305 aPTT 56s: inc to 1250 units/hr 4/13 2235 aPTT 98s 4/14 0515 aPTT 125s, HL 1.75: rate dec to 1100 units/hr 4/14 1420 aPTT 129s - decreased to 950 units/hr 4/14 2346 aPTT 137   Plan:  --4/14 @  2346 aPTT 137. Level is supratherapeutic. Will decrease heparin rate to 750 units/hr  --Recheck aPTT level in 6 hours after rate change.   Will continue to adjust dose based on aPTT for now and will switch to dosing to heparin level once both levels correlate.   Will continue to monitor daily CBC's and anti-Xa levels per protocol.   Pernell Dupre, PharmD, BCPS Clinical Pharmacist 01/30/2019 12:48 AM

## 2019-01-30 NOTE — Progress Notes (Signed)
[facility] arrived to transfer patient to [facility].

## 2019-01-30 NOTE — Progress Notes (Signed)
[facility] Encounter Note  Patient: Damon English / Admit Date: 01/28/2019 / Date of Encounter: 01/30/2019, 9:15 AM   Subjective: Patient has been admitted to the hospital for acute non-ST elevation myocardial infarction with peak troponin of 5.2 Echocardiogram showing some hypokinesis of the anterior apical myocardial segment with minimal valvular heart disease most consistent with a possible myocardial infarction and ejection fraction of 40% Patient does have peripheral vascular disease with no current symptoms Episode of apparent V. fib arrest yesterday morning around the time the patient likely was having some ischemic issues and post infarct fever fib arrest was treated with amiodarone as well as defibrillation. Patient he was continued on heparin and appropriate medication management and suddenly has had some chest discomfort over this past hour substernal in nature with some diaphoresis and shortness of breath.  This is completely resolved at this time and patient feels very comfortable overnight.  There was some anterolateral ST depression but this improved yesterday. Currently patient is hemodynamically stable We have had a long discussion with the patient about cardiac catheterization and risks and benefits.  This discussion included possibility of other bleeding complications when Eliquis is used.  He has had no use of Eliquis since Sunday evening and therefore would be lower risk at this time.   Cardiac catheterization from a radial approach due to significant right femoral artery bypass graft for peripheral vascular disease shows a 50% proximal right coronary artery stenosis 50% left main stenosis 40% circumflex stenosis and 99% ostial LAD stenosis Review of Systems: Positive for: Shortness of breath Negative for: Vision change, hearing change, syncope, dizziness, nausea, vomiting,diarrhea, bloody stool, stomach pain, cough, congestion, diaphoresis, urinary  frequency, urinary pain,skin lesions, skin rashes Others previously listed  Objective: Telemetry: Normal sinus rhythm Physical Exam: Blood pressure 118/66, pulse 67, temperature 97.8 F (36.6 C), temperature source Oral, resp. rate 15, height 5\' 8"  (1.727 m), weight 93.4 kg, SpO2 100 %. Body mass index is 31.32 kg/m. General: Well developed, well nourished, in no acute distress. Head: Normocephalic, atraumatic, sclera non-icteric, no xanthomas, nares are without discharge. Neck: No apparent masses Lungs: Normal respirations with no wheezes, no rhonchi, no rales , few crackles   Heart: Regular rate and rhythm, normal S1 S2, no murmur, no rub, no gallop, PMI is normal size and placement, carotid upstroke normal without bruit, jugular venous pressure normal Abdomen: Soft, non-tender, non-distended with normoactive bowel sounds. No hepatosplenomegaly. Abdominal aorta is normal size without bruit Extremities: Trace edema, no clubbing, no cyanosis, no ulcers,  Peripheral: 2+ radial, 2+ femoral, 0 + dorsal pedal pulses Neuro: Alert and oriented. Moves all extremities spontaneously. Psych:  Responds to questions appropriately with a normal affect.   Intake/Output Summary (Last 24 hours) at 01/30/2019 0915 Last data filed at 01/30/2019 0238 Gross per 24 hour  Intake 849.65 ml  Output 900 ml  Net -50.35 ml    Inpatient Medications:  . acetaminophen      . aspirin      . [MAR Hold] DULoxetine  60 mg Oral BID  . [MAR Hold] famotidine  40 mg Oral q morning - 10a  . [MAR Hold] losartan  100 mg Oral Daily   And  . [MAR Hold] hydrochlorothiazide  25 mg Oral Daily  . [MAR Hold] insulin aspart  0-9 Units Subcutaneous Q4H  . [MAR Hold] metoprolol tartrate  50 mg Oral BID  . [MAR Hold] nitroGLYCERIN  1 inch Topical Q6H  . [MAR Hold] pantoprazole  40 mg  Oral Daily  . [MAR Hold] potassium chloride  40 mEq Oral BID  . sodium chloride flush  3 mL Intravenous Q12H  . [MAR Hold] sucralfate  1 g Oral  TID AC & HS  . [MAR Hold] tamsulosin  0.4 mg Oral Daily  . [MAR Hold] thiamine injection  100 mg Intravenous Daily   Infusions:  . sodium chloride    . sodium chloride    . [START ON 01/31/2019] sodium chloride     Followed by  . [START ON 01/31/2019] sodium chloride    . dexmedetomidine (PRECEDEX) IV infusion    . heparin 750 Units/hr (01/30/19 0520)  . [MAR Hold] magnesium sulfate 1 - 4 g bolus IVPB    . banana bag IV 1000 mL 75 mL/hr at 01/30/19 0354    Labs: Recent Labs    01/28/19 0331 01/29/19 0515 01/30/19 0652  NA 139 138 136  K 3.0* 3.8 3.7  CL 102 102 104  CO2 22 26 24   GLUCOSE 147* 125* 140*  BUN 18 22 26*  CREATININE 1.10 1.11 1.39*  CALCIUM 9.1 8.9 8.4*  MG 1.7  --   --    Recent Labs    01/28/19 0331  AST 46*  ALT 34  ALKPHOS 51  BILITOT 1.1  PROT 7.5  ALBUMIN 4.1   Recent Labs    01/29/19 0515 01/30/19 0652  WBC 8.1 7.2  HGB 13.8 14.0  HCT 40.5 41.1  MCV 103.1* 104.1*  PLT 141* 133*   Recent Labs    01/28/19 1621 01/28/19 2235 01/29/19 0515 01/29/19 1420  TROPONINI 5.22* 6.17* 4.75* 2.77*   Invalid input(s): POCBNP No results for input(s): HGBA1C in the last 72 hours.   Weights: Filed Weights   01/28/19 0320  Weight: 93.4 kg     Radiology/Studies:  Dg Chest Port 1 View  Result Date: 01/28/2019 CLINICAL DATA:  68 y/o M; sharp anterior chest pain radiating down the left arm. EXAM: PORTABLE CHEST 1 VIEW COMPARISON:  11/08/2013 chest radiograph. FINDINGS: Stable cardiac silhouette within normal limits given projection and technique. Aortic atherosclerosis with calcification. Clear lungs. No pleural effusion or pneumothorax. No acute osseous abnormality is evident. IMPRESSION: No acute pulmonary process identified. Aortic Atherosclerosis (ICD10-I70.0). Electronically Signed   By: Kristine Garbe M.D.   On: 01/28/2019 03:42     Assessment and Recommendation  68 y.o. male with known peripheral vascular disease essential  hypertension mixed hyperlipidemia diabetes and progressive anginal symptoms over the last several weeks with acute non-ST elevation myocardial infarction and V. fib arrest Monday morning now improving with appropriate therapy and some recurrent chest pain yesterday at this time but stable hemodynamically Cardiac catheterization showing critical ostial LAD stenosis and moderate left main circumflex and right coronary artery stenosis 1.  Continue medication management for further risk reduction of cardiovascular event and non-ST elevation myocardial infarction including heparin and aspirin 2.  No further cardiac intervention at this time due to high risk ostial LAD stenosis which needs further consideration of bypass surgery versus high risk PCI 3.  Continue beta-blocker for better heart rate control and further risk reduction in ischemic event 4.  Abstain from Eliquis at this time for concerns of bleeding complications if necessary for intervention 5.  Transfer to [facility] for further evaluation and treatment options for significant ostial LAD stenosis and further intervention.  Discussed with family and the patient who wishes to proceed with this at this time  Signed, Serafina Royals M.D. FACC

## 2019-01-30 NOTE — Progress Notes (Signed)
Report given to [facility] and also RN receiving patient at [facility].  Patient is going to [address] and room 7703.  [facility] to arrive at approximately 1630.

## 2019-01-30 NOTE — Progress Notes (Signed)
Wife called and notified that patient was on his way to the cath lab.

## 2019-01-31 DIAGNOSIS — I200 Unstable angina: Secondary | ICD-10-CM | POA: Diagnosis not present

## 2019-01-31 DIAGNOSIS — I10 Essential (primary) hypertension: Secondary | ICD-10-CM | POA: Diagnosis not present

## 2019-01-31 DIAGNOSIS — I214 Non-ST elevation (NSTEMI) myocardial infarction: Secondary | ICD-10-CM | POA: Diagnosis not present

## 2019-01-31 DIAGNOSIS — I4901 Ventricular fibrillation: Secondary | ICD-10-CM | POA: Diagnosis not present

## 2019-02-01 DIAGNOSIS — I739 Peripheral vascular disease, unspecified: Secondary | ICD-10-CM | POA: Diagnosis not present

## 2019-02-01 DIAGNOSIS — I517 Cardiomegaly: Secondary | ICD-10-CM | POA: Diagnosis not present

## 2019-02-01 DIAGNOSIS — I2510 Atherosclerotic heart disease of native coronary artery without angina pectoris: Secondary | ICD-10-CM | POA: Diagnosis not present

## 2019-02-01 DIAGNOSIS — I208 Other forms of angina pectoris: Secondary | ICD-10-CM | POA: Diagnosis not present

## 2019-02-01 DIAGNOSIS — I214 Non-ST elevation (NSTEMI) myocardial infarction: Secondary | ICD-10-CM | POA: Diagnosis not present

## 2019-02-01 DIAGNOSIS — I255 Ischemic cardiomyopathy: Secondary | ICD-10-CM | POA: Diagnosis not present

## 2019-02-01 DIAGNOSIS — I5022 Chronic systolic (congestive) heart failure: Secondary | ICD-10-CM | POA: Diagnosis not present

## 2019-02-01 MED ORDER — GLUCAGON HCL RDNA (DIAGNOSTIC) 1 MG IJ SOLR
1.00 | INTRAMUSCULAR | Status: DC
Start: ? — End: 2019-02-01

## 2019-02-01 MED ORDER — GELATIN ABSORBABLE MT POWD
OROMUCOSAL | Status: DC
Start: ? — End: 2019-02-01

## 2019-02-01 MED ORDER — GENERIC EXTERNAL MEDICATION
Status: DC
Start: ? — End: 2019-02-01

## 2019-02-01 MED ORDER — HEPARIN SODIUM (PORCINE) 1000 UNIT/ML IJ SOLN
INTRAMUSCULAR | Status: DC
Start: ? — End: 2019-02-01

## 2019-02-01 MED ORDER — HEPARIN (PORCINE) IN NACL 25000-0.45 UT/250ML-% IV SOLN
1000.00 | INTRAVENOUS | Status: DC
Start: ? — End: 2019-02-01

## 2019-02-01 MED ORDER — VANCOMYCIN HCL 1 G IV SOLR
INTRAVENOUS | Status: DC
Start: ? — End: 2019-02-01

## 2019-02-01 MED ORDER — INSULIN REGULAR HUMAN 100 UNIT/ML IJ SOLN
0.00 | INTRAMUSCULAR | Status: DC
Start: 2019-02-01 — End: 2019-02-01

## 2019-02-01 MED ORDER — PAPAVERINE HCL 30 MG/ML IJ SOLN
INTRAMUSCULAR | Status: DC
Start: ? — End: 2019-02-01

## 2019-02-01 MED ORDER — SODIUM BICARBONATE 8.4 % IV SOLN
INTRAVENOUS | Status: DC
Start: ? — End: 2019-02-01

## 2019-02-01 MED ORDER — PANTOPRAZOLE SODIUM 40 MG PO TBEC
40.00 | DELAYED_RELEASE_TABLET | ORAL | Status: DC
Start: 2019-02-02 — End: 2019-02-01

## 2019-02-01 MED ORDER — HEPARIN (PORCINE) IN NACL 5000-0.9 UNIT/L-% IV SOLN
INTRAVENOUS | Status: DC
Start: ? — End: 2019-02-01

## 2019-02-01 MED ORDER — GENERIC EXTERNAL MEDICATION
12.50 | Status: DC
Start: 2019-02-01 — End: 2019-02-01

## 2019-02-01 MED ORDER — NITROGLYCERIN IN D5W 400-5 MCG/ML-% IV SOLN
10.00 | INTRAVENOUS | Status: DC
Start: ? — End: 2019-02-01

## 2019-02-01 MED ORDER — DOCUSATE SODIUM 100 MG PO CAPS
100.00 | ORAL_CAPSULE | ORAL | Status: DC
Start: 2019-02-01 — End: 2019-02-01

## 2019-02-01 MED ORDER — GENERIC EXTERNAL MEDICATION
1.00 | Status: DC
Start: 2019-02-01 — End: 2019-02-01

## 2019-02-01 MED ORDER — ALBUMIN HUMAN 25 % IV SOLN
INTRAVENOUS | Status: DC
Start: ? — End: 2019-02-01

## 2019-02-01 MED ORDER — ACETAMINOPHEN 325 MG PO TABS
650.00 | ORAL_TABLET | ORAL | Status: DC
Start: ? — End: 2019-02-01

## 2019-02-01 MED ORDER — DEXTROSE 50 % IV SOLN
12.50 | INTRAVENOUS | Status: DC
Start: ? — End: 2019-02-01

## 2019-02-01 MED ORDER — MANNITOL 25 % IV SOLN
INTRAVENOUS | Status: DC
Start: ? — End: 2019-02-01

## 2019-02-01 MED ORDER — ASPIRIN 81 MG PO CHEW
81.00 | CHEWABLE_TABLET | ORAL | Status: DC
Start: 2019-02-02 — End: 2019-02-01

## 2019-02-01 MED ORDER — ATORVASTATIN CALCIUM 80 MG PO TABS
80.00 | ORAL_TABLET | ORAL | Status: DC
Start: 2019-02-02 — End: 2019-02-01

## 2019-02-01 MED ORDER — PLASMA-LYTE A IV SOLN
INTRAVENOUS | Status: DC
Start: ? — End: 2019-02-01

## 2019-02-01 MED ORDER — GENERIC EXTERNAL MEDICATION
1.00 | Status: DC
Start: 2019-02-02 — End: 2019-02-01

## 2019-02-01 MED ORDER — LIDOCAINE HCL 1 % IJ SOLN
.50 | INTRAMUSCULAR | Status: DC
Start: ? — End: 2019-02-01

## 2019-02-02 DIAGNOSIS — G8918 Other acute postprocedural pain: Secondary | ICD-10-CM | POA: Insufficient documentation

## 2019-02-02 DIAGNOSIS — Z9889 Other specified postprocedural states: Secondary | ICD-10-CM | POA: Diagnosis not present

## 2019-02-02 DIAGNOSIS — T8111XA Postprocedural  cardiogenic shock, initial encounter: Secondary | ICD-10-CM | POA: Diagnosis not present

## 2019-02-02 DIAGNOSIS — R918 Other nonspecific abnormal finding of lung field: Secondary | ICD-10-CM | POA: Diagnosis not present

## 2019-02-02 DIAGNOSIS — D62 Acute posthemorrhagic anemia: Secondary | ICD-10-CM | POA: Insufficient documentation

## 2019-02-02 DIAGNOSIS — Z951 Presence of aortocoronary bypass graft: Secondary | ICD-10-CM | POA: Insufficient documentation

## 2019-02-02 DIAGNOSIS — I5023 Acute on chronic systolic (congestive) heart failure: Secondary | ICD-10-CM | POA: Diagnosis not present

## 2019-02-02 DIAGNOSIS — I10 Essential (primary) hypertension: Secondary | ICD-10-CM | POA: Diagnosis not present

## 2019-02-02 DIAGNOSIS — Z8674 Personal history of sudden cardiac arrest: Secondary | ICD-10-CM | POA: Insufficient documentation

## 2019-02-02 DIAGNOSIS — I214 Non-ST elevation (NSTEMI) myocardial infarction: Secondary | ICD-10-CM | POA: Diagnosis not present

## 2019-02-02 DIAGNOSIS — I442 Atrioventricular block, complete: Secondary | ICD-10-CM | POA: Diagnosis not present

## 2019-02-03 DIAGNOSIS — Z7901 Long term (current) use of anticoagulants: Secondary | ICD-10-CM | POA: Insufficient documentation

## 2019-02-03 DIAGNOSIS — Z8659 Personal history of other mental and behavioral disorders: Secondary | ICD-10-CM | POA: Insufficient documentation

## 2019-02-03 DIAGNOSIS — G8929 Other chronic pain: Secondary | ICD-10-CM | POA: Insufficient documentation

## 2019-02-03 DIAGNOSIS — Z951 Presence of aortocoronary bypass graft: Secondary | ICD-10-CM | POA: Diagnosis not present

## 2019-02-03 DIAGNOSIS — J9811 Atelectasis: Secondary | ICD-10-CM | POA: Diagnosis not present

## 2019-02-03 DIAGNOSIS — Z9889 Other specified postprocedural states: Secondary | ICD-10-CM | POA: Diagnosis not present

## 2019-02-03 DIAGNOSIS — J90 Pleural effusion, not elsewhere classified: Secondary | ICD-10-CM | POA: Diagnosis not present

## 2019-02-03 DIAGNOSIS — M549 Dorsalgia, unspecified: Secondary | ICD-10-CM | POA: Insufficient documentation

## 2019-02-04 DIAGNOSIS — Z951 Presence of aortocoronary bypass graft: Secondary | ICD-10-CM | POA: Diagnosis not present

## 2019-02-04 DIAGNOSIS — M48061 Spinal stenosis, lumbar region without neurogenic claudication: Secondary | ICD-10-CM | POA: Diagnosis not present

## 2019-02-04 DIAGNOSIS — K219 Gastro-esophageal reflux disease without esophagitis: Secondary | ICD-10-CM | POA: Diagnosis not present

## 2019-02-04 DIAGNOSIS — I214 Non-ST elevation (NSTEMI) myocardial infarction: Secondary | ICD-10-CM | POA: Diagnosis not present

## 2019-02-04 DIAGNOSIS — I639 Cerebral infarction, unspecified: Secondary | ICD-10-CM | POA: Diagnosis not present

## 2019-02-04 DIAGNOSIS — I63512 Cerebral infarction due to unspecified occlusion or stenosis of left middle cerebral artery: Secondary | ICD-10-CM | POA: Diagnosis not present

## 2019-02-05 DIAGNOSIS — Z951 Presence of aortocoronary bypass graft: Secondary | ICD-10-CM | POA: Diagnosis not present

## 2019-02-05 DIAGNOSIS — R931 Abnormal findings on diagnostic imaging of heart and coronary circulation: Secondary | ICD-10-CM | POA: Diagnosis not present

## 2019-02-05 DIAGNOSIS — I63512 Cerebral infarction due to unspecified occlusion or stenosis of left middle cerebral artery: Secondary | ICD-10-CM | POA: Diagnosis not present

## 2019-02-05 DIAGNOSIS — I639 Cerebral infarction, unspecified: Secondary | ICD-10-CM | POA: Diagnosis not present

## 2019-02-05 DIAGNOSIS — J982 Interstitial emphysema: Secondary | ICD-10-CM | POA: Diagnosis not present

## 2019-02-05 DIAGNOSIS — K219 Gastro-esophageal reflux disease without esophagitis: Secondary | ICD-10-CM | POA: Diagnosis not present

## 2019-02-05 DIAGNOSIS — I214 Non-ST elevation (NSTEMI) myocardial infarction: Secondary | ICD-10-CM | POA: Diagnosis not present

## 2019-02-05 DIAGNOSIS — M48061 Spinal stenosis, lumbar region without neurogenic claudication: Secondary | ICD-10-CM | POA: Diagnosis not present

## 2019-02-06 DIAGNOSIS — I680 Cerebral amyloid angiopathy: Secondary | ICD-10-CM | POA: Diagnosis not present

## 2019-02-06 DIAGNOSIS — E854 Organ-limited amyloidosis: Secondary | ICD-10-CM | POA: Diagnosis not present

## 2019-02-06 DIAGNOSIS — I4901 Ventricular fibrillation: Secondary | ICD-10-CM | POA: Diagnosis not present

## 2019-02-06 DIAGNOSIS — Z951 Presence of aortocoronary bypass graft: Secondary | ICD-10-CM | POA: Diagnosis not present

## 2019-02-06 DIAGNOSIS — I214 Non-ST elevation (NSTEMI) myocardial infarction: Secondary | ICD-10-CM | POA: Diagnosis not present

## 2019-02-06 DIAGNOSIS — I63512 Cerebral infarction due to unspecified occlusion or stenosis of left middle cerebral artery: Secondary | ICD-10-CM | POA: Diagnosis not present

## 2019-02-07 DIAGNOSIS — Z951 Presence of aortocoronary bypass graft: Secondary | ICD-10-CM | POA: Diagnosis not present

## 2019-02-07 DIAGNOSIS — I214 Non-ST elevation (NSTEMI) myocardial infarction: Secondary | ICD-10-CM | POA: Diagnosis not present

## 2019-02-07 DIAGNOSIS — J948 Other specified pleural conditions: Secondary | ICD-10-CM | POA: Diagnosis not present

## 2019-02-08 DIAGNOSIS — J939 Pneumothorax, unspecified: Secondary | ICD-10-CM | POA: Diagnosis not present

## 2019-02-08 DIAGNOSIS — Z951 Presence of aortocoronary bypass graft: Secondary | ICD-10-CM | POA: Diagnosis not present

## 2019-02-08 DIAGNOSIS — I214 Non-ST elevation (NSTEMI) myocardial infarction: Secondary | ICD-10-CM | POA: Diagnosis not present

## 2019-02-08 DIAGNOSIS — Z9889 Other specified postprocedural states: Secondary | ICD-10-CM | POA: Diagnosis not present

## 2019-02-12 DIAGNOSIS — E854 Organ-limited amyloidosis: Secondary | ICD-10-CM | POA: Insufficient documentation

## 2019-02-12 DIAGNOSIS — I214 Non-ST elevation (NSTEMI) myocardial infarction: Secondary | ICD-10-CM | POA: Diagnosis not present

## 2019-02-12 DIAGNOSIS — I130 Hypertensive heart and chronic kidney disease with heart failure and stage 1 through stage 4 chronic kidney disease, or unspecified chronic kidney disease: Secondary | ICD-10-CM | POA: Diagnosis not present

## 2019-02-12 DIAGNOSIS — I10 Essential (primary) hypertension: Secondary | ICD-10-CM | POA: Diagnosis not present

## 2019-02-12 DIAGNOSIS — I5021 Acute systolic (congestive) heart failure: Secondary | ICD-10-CM | POA: Diagnosis not present

## 2019-02-12 DIAGNOSIS — I680 Cerebral amyloid angiopathy: Secondary | ICD-10-CM | POA: Insufficient documentation

## 2019-02-12 DIAGNOSIS — I2510 Atherosclerotic heart disease of native coronary artery without angina pectoris: Secondary | ICD-10-CM | POA: Diagnosis not present

## 2019-02-12 DIAGNOSIS — R0600 Dyspnea, unspecified: Secondary | ICD-10-CM | POA: Diagnosis not present

## 2019-02-12 DIAGNOSIS — N182 Chronic kidney disease, stage 2 (mild): Secondary | ICD-10-CM | POA: Diagnosis not present

## 2019-02-12 DIAGNOSIS — I252 Old myocardial infarction: Secondary | ICD-10-CM | POA: Diagnosis not present

## 2019-02-12 DIAGNOSIS — I2609 Other pulmonary embolism with acute cor pulmonale: Secondary | ICD-10-CM | POA: Diagnosis not present

## 2019-02-12 DIAGNOSIS — E1122 Type 2 diabetes mellitus with diabetic chronic kidney disease: Secondary | ICD-10-CM | POA: Diagnosis not present

## 2019-02-12 DIAGNOSIS — Z951 Presence of aortocoronary bypass graft: Secondary | ICD-10-CM | POA: Diagnosis not present

## 2019-02-14 DIAGNOSIS — Z8673 Personal history of transient ischemic attack (TIA), and cerebral infarction without residual deficits: Secondary | ICD-10-CM | POA: Diagnosis not present

## 2019-02-14 DIAGNOSIS — I10 Essential (primary) hypertension: Secondary | ICD-10-CM | POA: Diagnosis not present

## 2019-02-14 DIAGNOSIS — I2510 Atherosclerotic heart disease of native coronary artery without angina pectoris: Secondary | ICD-10-CM | POA: Diagnosis not present

## 2019-02-14 DIAGNOSIS — Z1331 Encounter for screening for depression: Secondary | ICD-10-CM | POA: Diagnosis not present

## 2019-02-14 DIAGNOSIS — M792 Neuralgia and neuritis, unspecified: Secondary | ICD-10-CM | POA: Diagnosis not present

## 2019-02-14 DIAGNOSIS — Z79899 Other long term (current) drug therapy: Secondary | ICD-10-CM | POA: Diagnosis not present

## 2019-02-20 DIAGNOSIS — Z951 Presence of aortocoronary bypass graft: Secondary | ICD-10-CM | POA: Diagnosis not present

## 2019-02-21 DIAGNOSIS — R0600 Dyspnea, unspecified: Secondary | ICD-10-CM | POA: Diagnosis not present

## 2019-02-21 DIAGNOSIS — I5022 Chronic systolic (congestive) heart failure: Secondary | ICD-10-CM | POA: Diagnosis not present

## 2019-03-01 DIAGNOSIS — I214 Non-ST elevation (NSTEMI) myocardial infarction: Secondary | ICD-10-CM | POA: Diagnosis not present

## 2019-03-08 DIAGNOSIS — I4901 Ventricular fibrillation: Secondary | ICD-10-CM | POA: Diagnosis not present

## 2019-03-08 DIAGNOSIS — I214 Non-ST elevation (NSTEMI) myocardial infarction: Secondary | ICD-10-CM | POA: Diagnosis not present

## 2019-03-13 DIAGNOSIS — I2510 Atherosclerotic heart disease of native coronary artery without angina pectoris: Secondary | ICD-10-CM | POA: Diagnosis not present

## 2019-03-13 DIAGNOSIS — Z7901 Long term (current) use of anticoagulants: Secondary | ICD-10-CM | POA: Diagnosis not present

## 2019-03-13 DIAGNOSIS — I214 Non-ST elevation (NSTEMI) myocardial infarction: Secondary | ICD-10-CM | POA: Diagnosis not present

## 2019-03-13 DIAGNOSIS — E1151 Type 2 diabetes mellitus with diabetic peripheral angiopathy without gangrene: Secondary | ICD-10-CM | POA: Diagnosis not present

## 2019-03-13 DIAGNOSIS — Z95820 Peripheral vascular angioplasty status with implants and grafts: Secondary | ICD-10-CM | POA: Diagnosis not present

## 2019-03-13 DIAGNOSIS — I5022 Chronic systolic (congestive) heart failure: Secondary | ICD-10-CM | POA: Diagnosis not present

## 2019-03-13 DIAGNOSIS — Z87891 Personal history of nicotine dependence: Secondary | ICD-10-CM | POA: Diagnosis not present

## 2019-03-13 DIAGNOSIS — Z86711 Personal history of pulmonary embolism: Secondary | ICD-10-CM | POA: Diagnosis not present

## 2019-03-13 DIAGNOSIS — I82409 Acute embolism and thrombosis of unspecified deep veins of unspecified lower extremity: Secondary | ICD-10-CM | POA: Diagnosis not present

## 2019-03-13 DIAGNOSIS — I252 Old myocardial infarction: Secondary | ICD-10-CM | POA: Diagnosis not present

## 2019-03-13 DIAGNOSIS — I63412 Cerebral infarction due to embolism of left middle cerebral artery: Secondary | ICD-10-CM | POA: Diagnosis not present

## 2019-03-13 DIAGNOSIS — I110 Hypertensive heart disease with heart failure: Secondary | ICD-10-CM | POA: Diagnosis not present

## 2019-03-13 DIAGNOSIS — Z951 Presence of aortocoronary bypass graft: Secondary | ICD-10-CM | POA: Diagnosis not present

## 2019-03-13 DIAGNOSIS — I739 Peripheral vascular disease, unspecified: Secondary | ICD-10-CM | POA: Diagnosis not present

## 2019-03-13 DIAGNOSIS — I10 Essential (primary) hypertension: Secondary | ICD-10-CM | POA: Diagnosis not present

## 2019-03-13 DIAGNOSIS — D6859 Other primary thrombophilia: Secondary | ICD-10-CM | POA: Diagnosis not present

## 2019-03-14 DIAGNOSIS — I5022 Chronic systolic (congestive) heart failure: Secondary | ICD-10-CM | POA: Diagnosis not present

## 2019-03-14 DIAGNOSIS — E854 Organ-limited amyloidosis: Secondary | ICD-10-CM | POA: Diagnosis not present

## 2019-03-14 DIAGNOSIS — I214 Non-ST elevation (NSTEMI) myocardial infarction: Secondary | ICD-10-CM | POA: Diagnosis not present

## 2019-03-14 DIAGNOSIS — I2609 Other pulmonary embolism with acute cor pulmonale: Secondary | ICD-10-CM | POA: Diagnosis not present

## 2019-03-17 DIAGNOSIS — I5022 Chronic systolic (congestive) heart failure: Secondary | ICD-10-CM | POA: Insufficient documentation

## 2019-03-27 ENCOUNTER — Other Ambulatory Visit: Payer: Self-pay

## 2019-03-27 ENCOUNTER — Encounter: Payer: BLUE CROSS/BLUE SHIELD | Attending: Cardiovascular Disease | Admitting: *Deleted

## 2019-03-27 DIAGNOSIS — I214 Non-ST elevation (NSTEMI) myocardial infarction: Secondary | ICD-10-CM

## 2019-03-27 DIAGNOSIS — Z951 Presence of aortocoronary bypass graft: Secondary | ICD-10-CM

## 2019-03-27 NOTE — Progress Notes (Signed)
Confirm Consent - "In the setting of the current Covid19 crisis, you are scheduled to join our "At Home" [facility]  Cardiac or Pulmonary  Rehab program . Just as we do with many in-gym visits, in order for you to participate in this program, we must obtain consent.  If you'd like, I can send this to your mychart (if signed up) or email for you to review.  Otherwise, I can obtain your verbal consent now.  By agreeing to a Cardiac or Pulmonary Rehab Telehealth visit, we'd like you to understand that the technology does not allow for your Cardiac or Pulmonary Rehab team member to perform a physical assessment, and thus may limit their ability to fully assess your ability to perform exercise programs. If your provider identifies any concerns that need to be evaluated in person, we will make arrangements to do so.  Finally, though the technology is pretty good, we cannot assure that it will always work on either your or our end and we cannot ensure that we have a secure connection.  Cardiac and Pulmonary Rehab Telehealth visits and "At Home" cardiac and pulmonary rehab are provided at no cost to you. Are you willing to proceed?" STAFF: Did the patient verbally acknowledge consent to telehealth visit? Document YES/NO here:YES    Date and Time 03/27/2019 1027                                               Staff completing consent process: Heath Lark RN BSN CCRP  Email: [email or web address]:  Diagnosis: NSTEMI CABG x 3 CONSENT COMPLETED: Yes  Risk Stratification:moderate Risk Factors:  Diabetes Mellitus, hypertension, hypercholesterolemia/hyperlipidemia Current Exercise: walking small amount around house Patient Exercise Barriers :SOB   Right leg post surgery for vascular surgery numbness and pain  Back surgery June 2019- post op PE, leg surgery after all this PAD.  Mobility Assistive Device at Home:  none  Vital Sign Devices at [facility] pressure Exercise Equipment at Home:  Hand weights   Can change weight  to about 20 lbs     Stretch bands   balls Followup appointment made: Yes To use [facility]:Yes  Entered on Dashboard Yes  SMS sent with invite Yes   Appt made Jess 6/15 1300     Melissa 6/18 1330

## 2019-03-29 DIAGNOSIS — I4901 Ventricular fibrillation: Secondary | ICD-10-CM | POA: Diagnosis not present

## 2019-03-29 DIAGNOSIS — Z7901 Long term (current) use of anticoagulants: Secondary | ICD-10-CM | POA: Diagnosis not present

## 2019-03-29 DIAGNOSIS — Z951 Presence of aortocoronary bypass graft: Secondary | ICD-10-CM | POA: Diagnosis not present

## 2019-03-29 DIAGNOSIS — I517 Cardiomegaly: Secondary | ICD-10-CM | POA: Diagnosis not present

## 2019-03-29 DIAGNOSIS — I10 Essential (primary) hypertension: Secondary | ICD-10-CM | POA: Diagnosis not present

## 2019-03-29 DIAGNOSIS — R079 Chest pain, unspecified: Secondary | ICD-10-CM | POA: Diagnosis not present

## 2019-03-29 DIAGNOSIS — Z8674 Personal history of sudden cardiac arrest: Secondary | ICD-10-CM | POA: Diagnosis not present

## 2019-03-29 DIAGNOSIS — I5022 Chronic systolic (congestive) heart failure: Secondary | ICD-10-CM | POA: Diagnosis not present

## 2019-03-29 DIAGNOSIS — Z7982 Long term (current) use of aspirin: Secondary | ICD-10-CM | POA: Diagnosis not present

## 2019-03-29 DIAGNOSIS — E785 Hyperlipidemia, unspecified: Secondary | ICD-10-CM | POA: Diagnosis not present

## 2019-03-29 DIAGNOSIS — I951 Orthostatic hypotension: Secondary | ICD-10-CM | POA: Diagnosis not present

## 2019-03-29 DIAGNOSIS — T82858A Stenosis of vascular prosthetic devices, implants and grafts, initial encounter: Secondary | ICD-10-CM | POA: Diagnosis not present

## 2019-03-29 DIAGNOSIS — I214 Non-ST elevation (NSTEMI) myocardial infarction: Secondary | ICD-10-CM | POA: Diagnosis not present

## 2019-03-29 DIAGNOSIS — Z880 Allergy status to penicillin: Secondary | ICD-10-CM | POA: Diagnosis not present

## 2019-03-29 DIAGNOSIS — I25110 Atherosclerotic heart disease of native coronary artery with unstable angina pectoris: Secondary | ICD-10-CM | POA: Diagnosis not present

## 2019-03-29 DIAGNOSIS — K219 Gastro-esophageal reflux disease without esophagitis: Secondary | ICD-10-CM | POA: Diagnosis not present

## 2019-03-29 DIAGNOSIS — R9431 Abnormal electrocardiogram [ECG] [EKG]: Secondary | ICD-10-CM | POA: Diagnosis not present

## 2019-03-29 DIAGNOSIS — I25710 Atherosclerosis of autologous vein coronary artery bypass graft(s) with unstable angina pectoris: Secondary | ICD-10-CM | POA: Diagnosis not present

## 2019-03-29 DIAGNOSIS — R0602 Shortness of breath: Secondary | ICD-10-CM | POA: Diagnosis not present

## 2019-03-29 DIAGNOSIS — I4510 Unspecified right bundle-branch block: Secondary | ICD-10-CM | POA: Diagnosis not present

## 2019-03-29 DIAGNOSIS — I680 Cerebral amyloid angiopathy: Secondary | ICD-10-CM | POA: Diagnosis not present

## 2019-03-29 DIAGNOSIS — T82857A Stenosis of cardiac prosthetic devices, implants and grafts, initial encounter: Secondary | ICD-10-CM | POA: Diagnosis not present

## 2019-03-29 DIAGNOSIS — I110 Hypertensive heart disease with heart failure: Secondary | ICD-10-CM | POA: Diagnosis not present

## 2019-03-29 DIAGNOSIS — Z79899 Other long term (current) drug therapy: Secondary | ICD-10-CM | POA: Diagnosis not present

## 2019-03-29 DIAGNOSIS — Z8673 Personal history of transient ischemic attack (TIA), and cerebral infarction without residual deficits: Secondary | ICD-10-CM | POA: Diagnosis not present

## 2019-03-29 DIAGNOSIS — K589 Irritable bowel syndrome without diarrhea: Secondary | ICD-10-CM | POA: Diagnosis not present

## 2019-03-29 DIAGNOSIS — I25720 Atherosclerosis of autologous artery coronary artery bypass graft(s) with unstable angina pectoris: Secondary | ICD-10-CM | POA: Diagnosis not present

## 2019-03-29 DIAGNOSIS — I255 Ischemic cardiomyopathy: Secondary | ICD-10-CM | POA: Diagnosis not present

## 2019-03-29 DIAGNOSIS — E854 Organ-limited amyloidosis: Secondary | ICD-10-CM | POA: Diagnosis not present

## 2019-03-29 DIAGNOSIS — I2510 Atherosclerotic heart disease of native coronary artery without angina pectoris: Secondary | ICD-10-CM | POA: Diagnosis not present

## 2019-03-29 DIAGNOSIS — I5023 Acute on chronic systolic (congestive) heart failure: Secondary | ICD-10-CM | POA: Diagnosis not present

## 2019-03-30 DIAGNOSIS — R079 Chest pain, unspecified: Secondary | ICD-10-CM | POA: Diagnosis not present

## 2019-03-31 DIAGNOSIS — K0889 Other specified disorders of teeth and supporting structures: Secondary | ICD-10-CM | POA: Insufficient documentation

## 2019-03-31 DIAGNOSIS — R079 Chest pain, unspecified: Secondary | ICD-10-CM | POA: Insufficient documentation

## 2019-03-31 DIAGNOSIS — Z86711 Personal history of pulmonary embolism: Secondary | ICD-10-CM | POA: Insufficient documentation

## 2019-03-31 DIAGNOSIS — E785 Hyperlipidemia, unspecified: Secondary | ICD-10-CM | POA: Insufficient documentation

## 2019-03-31 DIAGNOSIS — N400 Enlarged prostate without lower urinary tract symptoms: Secondary | ICD-10-CM | POA: Insufficient documentation

## 2019-03-31 DIAGNOSIS — I255 Ischemic cardiomyopathy: Secondary | ICD-10-CM | POA: Insufficient documentation

## 2019-03-31 DIAGNOSIS — I2510 Atherosclerotic heart disease of native coronary artery without angina pectoris: Secondary | ICD-10-CM | POA: Insufficient documentation

## 2019-04-01 DIAGNOSIS — Z7901 Long term (current) use of anticoagulants: Secondary | ICD-10-CM | POA: Diagnosis not present

## 2019-04-01 DIAGNOSIS — I5023 Acute on chronic systolic (congestive) heart failure: Secondary | ICD-10-CM | POA: Diagnosis not present

## 2019-04-01 DIAGNOSIS — R079 Chest pain, unspecified: Secondary | ICD-10-CM | POA: Diagnosis not present

## 2019-04-01 DIAGNOSIS — I2510 Atherosclerotic heart disease of native coronary artery without angina pectoris: Secondary | ICD-10-CM | POA: Diagnosis not present

## 2019-04-01 MED ORDER — NITROGLYCERIN 0.4 MG SL SUBL
0.40 | SUBLINGUAL_TABLET | SUBLINGUAL | Status: DC
Start: ? — End: 2019-04-01

## 2019-04-01 MED ORDER — METOPROLOL SUCCINATE ER 100 MG PO TB24
200.00 | ORAL_TABLET | ORAL | Status: DC
Start: 2019-04-02 — End: 2019-04-01

## 2019-04-01 MED ORDER — CLONAZEPAM 0.5 MG PO TABS
0.50 | ORAL_TABLET | ORAL | Status: DC
Start: ? — End: 2019-04-01

## 2019-04-01 MED ORDER — ATORVASTATIN CALCIUM 80 MG PO TABS
80.00 | ORAL_TABLET | ORAL | Status: DC
Start: 2019-04-09 — End: 2019-04-01

## 2019-04-01 MED ORDER — ASPIRIN 81 MG PO CHEW
81.00 | CHEWABLE_TABLET | ORAL | Status: DC
Start: 2019-04-09 — End: 2019-04-01

## 2019-04-01 MED ORDER — BACITRACIN 500 UNIT/GM EX OINT
TOPICAL_OINTMENT | CUTANEOUS | Status: DC
Start: 2019-04-08 — End: 2019-04-01

## 2019-04-01 MED ORDER — SPIRONOLACTONE 25 MG PO TABS
25.00 | ORAL_TABLET | ORAL | Status: DC
Start: 2019-04-02 — End: 2019-04-01

## 2019-04-01 MED ORDER — APIXABAN 5 MG PO TABS
5.00 | ORAL_TABLET | ORAL | Status: DC
Start: 2019-04-01 — End: 2019-04-01

## 2019-04-01 MED ORDER — FUROSEMIDE 40 MG PO TABS
40.00 | ORAL_TABLET | ORAL | Status: DC
Start: 2019-04-02 — End: 2019-04-01

## 2019-04-01 MED ORDER — TAMSULOSIN HCL 0.4 MG PO CAPS
0.40 | ORAL_CAPSULE | ORAL | Status: DC
Start: 2019-04-09 — End: 2019-04-01

## 2019-04-01 MED ORDER — LIDOCAINE HCL (PF) 1 % IJ SOLN
0.50 | INTRAMUSCULAR | Status: DC
Start: ? — End: 2019-04-01

## 2019-04-01 MED ORDER — SACUBITRIL-VALSARTAN 24-26 MG PO TABS
1.00 | ORAL_TABLET | ORAL | Status: DC
Start: 2019-04-01 — End: 2019-04-01

## 2019-04-01 MED ORDER — SENNOSIDES-DOCUSATE SODIUM 8.6-50 MG PO TABS
1.00 | ORAL_TABLET | ORAL | Status: DC
Start: ? — End: 2019-04-01

## 2019-04-01 MED ORDER — PANTOPRAZOLE SODIUM 40 MG PO TBEC
40.00 | DELAYED_RELEASE_TABLET | ORAL | Status: DC
Start: 2019-04-09 — End: 2019-04-01

## 2019-04-01 MED ORDER — HYDROCODONE-ACETAMINOPHEN 5-325 MG PO TABS
1.00 | ORAL_TABLET | ORAL | Status: DC
Start: ? — End: 2019-04-01

## 2019-04-02 DIAGNOSIS — I5023 Acute on chronic systolic (congestive) heart failure: Secondary | ICD-10-CM | POA: Diagnosis not present

## 2019-04-02 DIAGNOSIS — R079 Chest pain, unspecified: Secondary | ICD-10-CM | POA: Diagnosis not present

## 2019-04-02 DIAGNOSIS — Z7901 Long term (current) use of anticoagulants: Secondary | ICD-10-CM | POA: Diagnosis not present

## 2019-04-02 DIAGNOSIS — I2510 Atherosclerotic heart disease of native coronary artery without angina pectoris: Secondary | ICD-10-CM | POA: Diagnosis not present

## 2019-04-03 DIAGNOSIS — I25720 Atherosclerosis of autologous artery coronary artery bypass graft(s) with unstable angina pectoris: Secondary | ICD-10-CM | POA: Diagnosis not present

## 2019-04-03 DIAGNOSIS — I25110 Atherosclerotic heart disease of native coronary artery with unstable angina pectoris: Secondary | ICD-10-CM | POA: Diagnosis not present

## 2019-04-03 DIAGNOSIS — I5023 Acute on chronic systolic (congestive) heart failure: Secondary | ICD-10-CM | POA: Diagnosis not present

## 2019-04-03 DIAGNOSIS — Z7901 Long term (current) use of anticoagulants: Secondary | ICD-10-CM | POA: Diagnosis not present

## 2019-04-03 DIAGNOSIS — I255 Ischemic cardiomyopathy: Secondary | ICD-10-CM | POA: Diagnosis not present

## 2019-04-03 DIAGNOSIS — R079 Chest pain, unspecified: Secondary | ICD-10-CM | POA: Diagnosis not present

## 2019-04-03 DIAGNOSIS — E854 Organ-limited amyloidosis: Secondary | ICD-10-CM | POA: Diagnosis not present

## 2019-04-03 DIAGNOSIS — I680 Cerebral amyloid angiopathy: Secondary | ICD-10-CM | POA: Diagnosis not present

## 2019-04-03 DIAGNOSIS — Z8673 Personal history of transient ischemic attack (TIA), and cerebral infarction without residual deficits: Secondary | ICD-10-CM | POA: Diagnosis not present

## 2019-04-03 DIAGNOSIS — I2510 Atherosclerotic heart disease of native coronary artery without angina pectoris: Secondary | ICD-10-CM | POA: Diagnosis not present

## 2019-04-03 DIAGNOSIS — I25710 Atherosclerosis of autologous vein coronary artery bypass graft(s) with unstable angina pectoris: Secondary | ICD-10-CM | POA: Diagnosis not present

## 2019-04-04 DIAGNOSIS — E854 Organ-limited amyloidosis: Secondary | ICD-10-CM | POA: Diagnosis not present

## 2019-04-04 DIAGNOSIS — Z951 Presence of aortocoronary bypass graft: Secondary | ICD-10-CM | POA: Diagnosis not present

## 2019-04-04 DIAGNOSIS — Z7901 Long term (current) use of anticoagulants: Secondary | ICD-10-CM | POA: Diagnosis not present

## 2019-04-04 DIAGNOSIS — Z8674 Personal history of sudden cardiac arrest: Secondary | ICD-10-CM | POA: Diagnosis not present

## 2019-04-04 DIAGNOSIS — I25720 Atherosclerosis of autologous artery coronary artery bypass graft(s) with unstable angina pectoris: Secondary | ICD-10-CM | POA: Diagnosis not present

## 2019-04-04 DIAGNOSIS — I25110 Atherosclerotic heart disease of native coronary artery with unstable angina pectoris: Secondary | ICD-10-CM | POA: Diagnosis not present

## 2019-04-04 DIAGNOSIS — T82857A Stenosis of cardiac prosthetic devices, implants and grafts, initial encounter: Secondary | ICD-10-CM | POA: Diagnosis not present

## 2019-04-04 DIAGNOSIS — Z8673 Personal history of transient ischemic attack (TIA), and cerebral infarction without residual deficits: Secondary | ICD-10-CM | POA: Diagnosis not present

## 2019-04-04 DIAGNOSIS — I5023 Acute on chronic systolic (congestive) heart failure: Secondary | ICD-10-CM | POA: Diagnosis not present

## 2019-04-04 DIAGNOSIS — I680 Cerebral amyloid angiopathy: Secondary | ICD-10-CM | POA: Diagnosis not present

## 2019-04-05 DIAGNOSIS — I25110 Atherosclerotic heart disease of native coronary artery with unstable angina pectoris: Secondary | ICD-10-CM | POA: Diagnosis not present

## 2019-04-05 DIAGNOSIS — Z7901 Long term (current) use of anticoagulants: Secondary | ICD-10-CM | POA: Diagnosis not present

## 2019-04-05 DIAGNOSIS — I5023 Acute on chronic systolic (congestive) heart failure: Secondary | ICD-10-CM | POA: Diagnosis not present

## 2019-04-05 DIAGNOSIS — Z8674 Personal history of sudden cardiac arrest: Secondary | ICD-10-CM | POA: Diagnosis not present

## 2019-04-06 DIAGNOSIS — I25110 Atherosclerotic heart disease of native coronary artery with unstable angina pectoris: Secondary | ICD-10-CM | POA: Diagnosis not present

## 2019-04-06 DIAGNOSIS — I5023 Acute on chronic systolic (congestive) heart failure: Secondary | ICD-10-CM | POA: Diagnosis not present

## 2019-04-06 DIAGNOSIS — Z8674 Personal history of sudden cardiac arrest: Secondary | ICD-10-CM | POA: Diagnosis not present

## 2019-04-06 DIAGNOSIS — Z7901 Long term (current) use of anticoagulants: Secondary | ICD-10-CM | POA: Diagnosis not present

## 2019-04-07 DIAGNOSIS — I951 Orthostatic hypotension: Secondary | ICD-10-CM | POA: Diagnosis not present

## 2019-04-07 DIAGNOSIS — I25110 Atherosclerotic heart disease of native coronary artery with unstable angina pectoris: Secondary | ICD-10-CM | POA: Diagnosis not present

## 2019-04-07 DIAGNOSIS — I5023 Acute on chronic systolic (congestive) heart failure: Secondary | ICD-10-CM | POA: Diagnosis not present

## 2019-04-07 DIAGNOSIS — Z7901 Long term (current) use of anticoagulants: Secondary | ICD-10-CM | POA: Diagnosis not present

## 2019-04-08 DIAGNOSIS — Z951 Presence of aortocoronary bypass graft: Secondary | ICD-10-CM | POA: Diagnosis not present

## 2019-04-08 DIAGNOSIS — I951 Orthostatic hypotension: Secondary | ICD-10-CM | POA: Insufficient documentation

## 2019-04-08 DIAGNOSIS — I214 Non-ST elevation (NSTEMI) myocardial infarction: Secondary | ICD-10-CM | POA: Diagnosis not present

## 2019-04-08 DIAGNOSIS — I2510 Atherosclerotic heart disease of native coronary artery without angina pectoris: Secondary | ICD-10-CM | POA: Diagnosis not present

## 2019-04-08 DIAGNOSIS — I5023 Acute on chronic systolic (congestive) heart failure: Secondary | ICD-10-CM | POA: Diagnosis not present

## 2019-04-08 DIAGNOSIS — I4901 Ventricular fibrillation: Secondary | ICD-10-CM | POA: Diagnosis not present

## 2019-04-08 MED ORDER — NITROGLYCERIN 0.4 MG SL SUBL
0.40 | SUBLINGUAL_TABLET | SUBLINGUAL | Status: DC
Start: ? — End: 2019-04-08

## 2019-04-08 MED ORDER — CLOPIDOGREL BISULFATE 75 MG PO TABS
75.00 | ORAL_TABLET | ORAL | Status: DC
Start: 2019-04-09 — End: 2019-04-08

## 2019-04-08 MED ORDER — GENERIC EXTERNAL MEDICATION
12.50 | Status: DC
Start: 2019-04-08 — End: 2019-04-08

## 2019-04-08 MED ORDER — MELATONIN 3 MG PO TABS
6.00 | ORAL_TABLET | ORAL | Status: DC
Start: 2019-04-08 — End: 2019-04-08

## 2019-04-08 MED ORDER — GENERIC EXTERNAL MEDICATION
1.00 | Status: DC
Start: 2019-04-08 — End: 2019-04-08

## 2019-04-08 MED ORDER — RANOLAZINE ER 500 MG PO TB12
500.00 | ORAL_TABLET | ORAL | Status: DC
Start: 2019-04-08 — End: 2019-04-08

## 2019-04-08 MED ORDER — ACETAMINOPHEN 325 MG PO TABS
650.00 | ORAL_TABLET | ORAL | Status: DC
Start: ? — End: 2019-04-08

## 2019-04-08 MED ORDER — SPIRONOLACTONE 25 MG PO TABS
12.50 | ORAL_TABLET | ORAL | Status: DC
Start: 2019-04-09 — End: 2019-04-08

## 2019-04-08 MED ORDER — CLONAZEPAM 0.5 MG PO TABS
0.50 | ORAL_TABLET | ORAL | Status: DC
Start: ? — End: 2019-04-08

## 2019-04-08 MED ORDER — HYDROCODONE-ACETAMINOPHEN 5-325 MG PO TABS
1.00 | ORAL_TABLET | ORAL | Status: DC
Start: ? — End: 2019-04-08

## 2019-04-08 MED ORDER — APIXABAN 5 MG PO TABS
5.00 | ORAL_TABLET | ORAL | Status: DC
Start: 2019-04-08 — End: 2019-04-08

## 2019-04-09 DIAGNOSIS — I2510 Atherosclerotic heart disease of native coronary artery without angina pectoris: Secondary | ICD-10-CM | POA: Diagnosis not present

## 2019-04-09 DIAGNOSIS — Z951 Presence of aortocoronary bypass graft: Secondary | ICD-10-CM | POA: Diagnosis not present

## 2019-04-09 DIAGNOSIS — I951 Orthostatic hypotension: Secondary | ICD-10-CM | POA: Diagnosis not present

## 2019-04-09 DIAGNOSIS — I5023 Acute on chronic systolic (congestive) heart failure: Secondary | ICD-10-CM | POA: Diagnosis not present

## 2019-04-10 DIAGNOSIS — I951 Orthostatic hypotension: Secondary | ICD-10-CM | POA: Diagnosis not present

## 2019-04-10 DIAGNOSIS — Z951 Presence of aortocoronary bypass graft: Secondary | ICD-10-CM | POA: Diagnosis not present

## 2019-04-10 DIAGNOSIS — I5023 Acute on chronic systolic (congestive) heart failure: Secondary | ICD-10-CM | POA: Diagnosis not present

## 2019-04-10 DIAGNOSIS — I2510 Atherosclerotic heart disease of native coronary artery without angina pectoris: Secondary | ICD-10-CM | POA: Diagnosis not present

## 2019-04-15 ENCOUNTER — Other Ambulatory Visit: Payer: Self-pay

## 2019-04-15 ENCOUNTER — Encounter: Payer: BLUE CROSS/BLUE SHIELD | Admitting: *Deleted

## 2019-04-15 DIAGNOSIS — I214 Non-ST elevation (NSTEMI) myocardial infarction: Secondary | ICD-10-CM

## 2019-04-15 DIAGNOSIS — Z951 Presence of aortocoronary bypass graft: Secondary | ICD-10-CM

## 2019-04-15 NOTE — Progress Notes (Signed)
Cardiac Individual Treatment Plan  Patient Details  Name: Damon English MRN: [id number] Date of Birth: 14-Mar-1951 Referring Provider:     Cardiac Rehab from 04/15/2019 in [facility] Cardiac and Pulmonary Rehab  Referring Provider  Cloretta Ned MD      Initial Encounter Date:    Cardiac Rehab from 04/15/2019 in [facility] Cardiac and Pulmonary Rehab  Date  04/15/19      Visit Diagnosis: NSTEMI (non-ST elevation myocardial infarction) ([place])  S/P CABG x 3  Patient's Home Medications on Admission:  Current Outpatient Medications:  .  apixaban (ELIQUIS) 5 MG TABS tablet, Take by mouth., Disp: , Rfl:  .  aspirin 81 MG chewable tablet, Chew by mouth., Disp: , Rfl:  .  atorvastatin (LIPITOR) 80 MG tablet, Take by mouth., Disp: , Rfl:  .  clonazePAM (KLONOPIN) 0.5 MG tablet, Take 0.25-0.5 mg by mouth 3 (three) times daily as needed for anxiety. , Disp: , Rfl:  .  DULoxetine (CYMBALTA) 60 MG capsule, Take 1 capsule (60 mg total) by mouth 2 (two) times daily. (Patient not taking: Reported on 03/27/2019), Disp: 20 capsule, Rfl: 0 .  esomeprazole (NEXIUM) 40 MG capsule, Take 1 capsule by mouth daily., Disp: , Rfl:  .  famotidine (PEPCID) 40 MG tablet, Take 1 tablet by mouth every morning., Disp: , Rfl:  .  HYDROcodone-acetaminophen (NORCO/VICODIN) 5-325 MG tablet, Take 0.5-1 tablets by mouth 2 (two) times daily as needed for pain., Disp: , Rfl:  .  Latanoprost 0.005 % EMUL, Apply to eye., Disp: , Rfl:  .  Magnesium (V-R MAGNESIUM) 250 MG TABS, Take by mouth., Disp: , Rfl:  .  metoprolol succinate (TOPROL-XL) 25 MG 24 hr tablet, Take 1 tablet by mouth daily., Disp: , Rfl:  .  metoprolol succinate (TOPROL-XL) 50 MG 24 hr tablet, Take 50 mg by mouth 2 (two) times a day. Take with or immediately following a meal., Disp: , Rfl:  .  ramipril (ALTACE) 2.5 MG capsule, Take by mouth., Disp: , Rfl:  .  SENNOSIDES PO, Take by mouth., Disp: , Rfl:  .  spironolactone (ALDACTONE) 25 MG tablet, Take 25 mg by mouth.,  Disp: , Rfl:  .  sucralfate (CARAFATE) 1 g tablet, Take 1 tablet by mouth 4 (four) times daily -  before meals and at bedtime., Disp: , Rfl:   Past Medical History: Past Medical History:  Diagnosis Date  . Alcohol use    "cutting back" but still heavy and daily, multiple shots of bourbon each night  . Diabetes mellitus without complication ([place])   . History of tobacco use    reportedly quit in 2013  . Hypertension   . Pulmonary embolism ([place]) 2012   unprovoked  . PVD (peripheral vascular disease) ([place])     Tobacco Use: Social History   Tobacco Use  Smoking Status Former Smoker  Smokeless Tobacco Never Used    Labs: Recent [employer] for ITP Cardiac and Pulmonary Rehab Latest Ref Rng & Units 01/28/2019 01/29/2019   Cholestrol 0 - 200 mg/dL 168 143   LDLCALC 0 - 99 mg/dL 113(H) 93   HDL >40 mg/dL 36(L) 33(L)   Trlycerides <150 mg/dL 97 87       Exercise Target Goals: Exercise Program Goal: Individual exercise prescription set using results from initial 6 min walk test and THRR while considering  patient's activity barriers and safety.   Exercise Prescription Goal: Initial exercise prescription builds to 30-45 minutes a day of aerobic  activity, 2-3 days per week.  Home exercise guidelines will be given to patient during program as part of exercise prescription that the participant will acknowledge.  Activity Barriers & Risk Stratification: Activity Barriers & Cardiac Risk Stratification - 04/15/19 1305      Activity Barriers & Cardiac Risk Stratification   Activity Barriers  Muscular Weakness;Deconditioning;Back Problems;Other (comment);Chest Pain/Angina;Shortness of Breath;Assistive Device;Balance Concerns    Comments  R leg numb/pain post-op, Hx back surgery, PAD, r foot numb, uses walker on occasion    Cardiac Risk Stratification  High       6 Minute Walk:   Oxygen Initial Assessment:   Oxygen Re-Evaluation:   Oxygen Discharge (Final  Oxygen Re-Evaluation):   Initial Exercise Prescription: Initial Exercise Prescription - 04/15/19 1300      Date of Initial Exercise RX and Referring Provider   Date  04/15/19    Referring Provider  Cloretta Ned MD      Track   Minutes  15      Prescription Details   Frequency (times per week)  3    Duration  Progress to 30 minutes of continuous aerobic without signs/symptoms of physical distress      Intensity   THRR 40-80% of Max Heartrate  103-137    Ratings of Perceived Exertion  11-13    Perceived Dyspnea  0-4      Progression   Progression  Continue to progress workloads to maintain intensity without signs/symptoms of physical distress.      Resistance Training   Training Prescription  Yes    Weight  Body Weight/ROM/3 lbs    Reps  10-15       Perform Capillary Blood Glucose checks as needed.  Exercise Prescription Changes:   Exercise Comments:   Exercise Goals and Review:   Exercise Goals Re-Evaluation :   Discharge Exercise Prescription (Final Exercise Prescription Changes):   Nutrition:  Target Goals: Understanding of nutrition guidelines, daily intake of sodium <1569m, cholesterol <2018m calories 30% from fat and 7% or less from saturated fats, daily to have 5 or more servings of fruits and vegetables.  Biometrics:    Nutrition Therapy Plan and Nutrition Goals:   Nutrition Assessments:   Nutrition Goals Re-Evaluation:   Nutrition Goals Discharge (Final Nutrition Goals Re-Evaluation):   Psychosocial: Target Goals: Acknowledge presence or absence of significant depression and/or stress, maximize coping skills, provide positive support system. Participant is able to verbalize types and ability to use techniques and skills needed for reducing stress and depression.   Initial Review & Psychosocial Screening:   Quality of Life Scores:   Scores of 19 and below usually indicate a poorer quality of life in these areas.  A difference of   2-3 points is a clinically meaningful difference.  A difference of 2-3 points in the total score of the Quality of Life Index has been associated with significant improvement in overall quality of life, self-image, physical symptoms, and general health in studies assessing change in quality of life.  PHQ-9: Recent Review Flowsheet Data    There is no flowsheet data to display.     Interpretation of Total Score  Total Score Depression Severity:  1-4 = Minimal depression, 5-9 = Mild depression, 10-14 = Moderate depression, 15-19 = Moderately severe depression, 20-27 = Severe depression   Psychosocial Evaluation and Intervention:   Psychosocial Re-Evaluation:   Psychosocial Discharge (Final Psychosocial Re-Evaluation):   Vocational Rehabilitation: Provide vocational rehab assistance to qualifying candidates.   Vocational Rehab Evaluation &  Intervention:   Education: Education Goals: Education classes will be provided on a variety of topics geared toward better understanding of heart health and risk factor modification. Participant will state understanding/return demonstration of topics presented as noted by education test scores.  Learning Barriers/Preferences:   Education Topics:  AED/CPR: - Group verbal and written instruction with the use of models to demonstrate the basic use of the AED with the basic ABC's of resuscitation.   General Nutrition Guidelines/Fats and Fiber: -Group instruction provided by verbal, written material, models and posters to present the general guidelines for heart healthy nutrition. Gives an explanation and review of dietary fats and fiber.   Controlling Sodium/Reading Food Labels: -Group verbal and written material supporting the discussion of sodium use in heart healthy nutrition. Review and explanation with models, verbal and written materials for utilization of the food label.   Exercise Physiology & General Exercise Guidelines: - Group  verbal and written instruction with models to review the exercise physiology of the cardiovascular system and associated critical values. Provides general exercise guidelines with specific guidelines to those with heart or lung disease.    Aerobic Exercise & Resistance Training: - Gives group verbal and written instruction on the various components of exercise. Focuses on aerobic and resistive training programs and the benefits of this training and how to safely progress through these programs..   Flexibility, Balance, Mind/Body Relaxation: Provides group verbal/written instruction on the benefits of flexibility and balance training, including mind/body exercise modes such as yoga, pilates and tai chi.  Demonstration and skill practice provided.   Stress and Anxiety: - Provides group verbal and written instruction about the health risks of elevated stress and causes of high stress.  Discuss the correlation between heart/lung disease and anxiety and treatment options. Review healthy ways to manage with stress and anxiety.   Depression: - Provides group verbal and written instruction on the correlation between heart/lung disease and depressed mood, treatment options, and the stigmas associated with seeking treatment.   Anatomy & Physiology of the Heart: - Group verbal and written instruction and models provide basic cardiac anatomy and physiology, with the coronary electrical and arterial systems. Review of Valvular disease and Heart Failure   Cardiac Procedures: - Group verbal and written instruction to review commonly prescribed medications for heart disease. Reviews the medication, class of the drug, and side effects. Includes the steps to properly store meds and maintain the prescription regimen. (beta blockers and nitrates)   Cardiac Medications I: - Group verbal and written instruction to review commonly prescribed medications for heart disease. Reviews the medication, class of the  drug, and side effects. Includes the steps to properly store meds and maintain the prescription regimen.   Cardiac Medications II: -Group verbal and written instruction to review commonly prescribed medications for heart disease. Reviews the medication, class of the drug, and side effects. (all other drug classes)    Go Sex-Intimacy & Heart Disease, Get SMART - Goal Setting: - Group verbal and written instruction through game format to discuss heart disease and the return to sexual intimacy. Provides group verbal and written material to discuss and apply goal setting through the application of the S.M.A.R.T. Method.   Other Matters of the Heart: - Provides group verbal, written materials and models to describe Stable Angina and Peripheral Artery. Includes description of the disease process and treatment options available to the cardiac patient.   Exercise & Equipment Safety: - Individual verbal instruction and demonstration of equipment use and safety with  use of the equipment.   Infection Prevention: - Provides verbal and written material to individual with discussion of infection control including proper hand washing and proper equipment cleaning during exercise session.   Falls Prevention: - Provides verbal and written material to individual with discussion of falls prevention and safety.   Diabetes: - Individual verbal and written instruction to review signs/symptoms of diabetes, desired ranges of glucose level fasting, after meals and with exercise. Acknowledge that pre and post exercise glucose checks will be done for 3 sessions at entry of program.   Know Your Numbers and Risk Factors: -Group verbal and written instruction about important numbers in your health.  Discussion of what are risk factors and how they play a role in the disease process.  Review of Cholesterol, Blood Pressure, Diabetes, and BMI and the role they play in your overall health.   Sleep  Hygiene: -Provides group verbal and written instruction about how sleep can affect your health.  Define sleep hygiene, discuss sleep cycles and impact of sleep habits. Review good sleep hygiene tips.    Other: -Provides group and verbal instruction on various topics (see comments)   Knowledge Questionnaire Score:   Core Components/Risk Factors/Patient Goals at Admission: Personal Goals and Risk Factors at Admission - 04/15/19 1333      Core Components/Risk Factors/Patient Goals on Admission    Weight Management  Yes;Weight Loss    Intervention  Weight Management: Develop a combined nutrition and exercise program designed to reach desired caloric intake, while maintaining appropriate intake of nutrient and fiber, sodium and fats, and appropriate energy expenditure required for the weight goal.;Weight Management: Provide education and appropriate resources to help participant work on and attain dietary goals.    Expected Outcomes  Short Term: Continue to assess and modify interventions until short term weight is achieved;Long Term: Adherence to nutrition and physical activity/exercise program aimed toward attainment of established weight goal;Weight Loss: Understanding of general recommendations for a balanced deficit meal plan, which promotes 1-2 lb weight loss per week and includes a negative energy balance of [phone] kcal/d;Understanding recommendations for meals to include 15-35% energy as protein, 25-35% energy from fat, 35-60% energy from carbohydrates, less than 261m of dietary cholesterol, 20-35 gm of total fiber daily;Understanding of distribution of calorie intake throughout the day with the consumption of 4-5 meals/snacks    Diabetes  Yes    Intervention  Provide education about signs/symptoms and action to take for hypo/hyperglycemia.;Provide education about proper nutrition, including hydration, and aerobic/resistive exercise prescription along with prescribed medications to achieve  blood glucose in normal ranges: Fasting glucose 65-99 mg/dL    Expected Outcomes  Short Term: Participant verbalizes understanding of the signs/symptoms and immediate care of hyper/hypoglycemia, proper foot care and importance of medication, aerobic/resistive exercise and nutrition plan for blood glucose control.;Long Term: Attainment of HbA1C < 7%.    Hypertension  Yes    Intervention  Provide education on lifestyle modifcations including regular physical activity/exercise, weight management, moderate sodium restriction and increased consumption of fresh fruit, vegetables, and low fat dairy, alcohol moderation, and smoking cessation.;Monitor prescription use compliance.    Expected Outcomes  Short Term: Continued assessment and intervention until BP is < 140/915mHG in hypertensive participants. < 130/8058mG in hypertensive participants with diabetes, heart failure or chronic kidney disease.;Long Term: Maintenance of blood pressure at goal levels.    Lipids  Yes    Intervention  Provide education and support for participant on nutrition & aerobic/resistive exercise along with prescribed  medications to achieve LDL <79m, HDL >46m    Expected Outcomes  Short Term: Participant states understanding of desired cholesterol values and is compliant with medications prescribed. Participant is following exercise prescription and nutrition guidelines.;Long Term: Cholesterol controlled with medications as prescribed, with individualized exercise RX and with personalized nutrition plan. Value goals: LDL < 703mHDL > 40 mg.       Core Components/Risk Factors/Patient Goals Review:    Core Components/Risk Factors/Patient Goals at Discharge (Final Review):    ITP Comments: ITP Comments    Row Name 03/27/19 1100 04/15/19 1304         ITP Comments  Initial Consent and Intake completed for entry into the Virtual Home Based Program. Will also use [employer] App. Appts made for EP and RD.   Completed  initial ExRx created and sent to Dr. MarEmily Filbertedical Director to review and sign.         Comments: Initial Virtual ExRx

## 2019-04-15 NOTE — Progress Notes (Signed)
Starting out your exercise session should last for 10 to 15 minutes for 3 to 5 days a week.    Your progression for home exercise is:  Start with 10-15 min 3 days a week, then slowly begin to add a minute each day to build up to 30 min continuous. Then build to five days a week. Remember this time can be a combo of walking and videos.  Continue to monitor your chest pain and shortness of breath, take rest breaks as needed.  Resistance: Start with no more than 3-5 lbs in each hand. (5 + 5 = 10 lbs) Stay here until surgeon releases you to lift more weight, or until you are 3 months post-surgery (05/23/19).  Please aim for 6-8 exercises of 12 reps each.  You can increase resistance by adding in another set or adding weight.    Exercise at a comfortable pace, using your heart rate and rate of perceived exertion as guides for intensity.  Your target heart rate range is 103-137.

## 2019-04-15 NOTE — Progress Notes (Signed)
Nutrition Consultation Complete

## 2019-04-16 DIAGNOSIS — I10 Essential (primary) hypertension: Secondary | ICD-10-CM | POA: Diagnosis not present

## 2019-04-16 DIAGNOSIS — I5022 Chronic systolic (congestive) heart failure: Secondary | ICD-10-CM | POA: Diagnosis not present

## 2019-04-16 DIAGNOSIS — R0600 Dyspnea, unspecified: Secondary | ICD-10-CM | POA: Diagnosis not present

## 2019-04-16 DIAGNOSIS — R42 Dizziness and giddiness: Secondary | ICD-10-CM | POA: Diagnosis not present

## 2019-04-30 ENCOUNTER — Other Ambulatory Visit: Payer: Self-pay

## 2019-04-30 ENCOUNTER — Encounter: Payer: Medicare Other | Attending: Cardiovascular Disease | Admitting: *Deleted

## 2019-04-30 DIAGNOSIS — Z951 Presence of aortocoronary bypass graft: Secondary | ICD-10-CM

## 2019-04-30 DIAGNOSIS — I214 Non-ST elevation (NSTEMI) myocardial infarction: Secondary | ICD-10-CM

## 2019-04-30 NOTE — Progress Notes (Signed)
Virtual Visit Completed  Xzander continues to have ongoing chest pain and low orthostatic blood pressure issues.  He cardiologist has asked for him to hold off on exercise until they are able to get his symptoms under better control.  He continues to have a major blockage causing angina, thus [place] takes up to 6 nitroglycerin a day.  He chest pain is brought on by simple movements such as going to the bathroom.  He is not yet stable for rehab. He has a follow up appt tomorrow and will let us know the plan going forward.    Yesterday, his weight was up 3 lbs after eating out over the weekend.  He was told to take extra lasix and is already down 2 1/2 lbs today and his leg swelling has also significantly decreased per his report.    We will continue to monitor his progress.

## 2019-05-01 DIAGNOSIS — I951 Orthostatic hypotension: Secondary | ICD-10-CM | POA: Diagnosis not present

## 2019-05-01 DIAGNOSIS — I2510 Atherosclerotic heart disease of native coronary artery without angina pectoris: Secondary | ICD-10-CM | POA: Diagnosis not present

## 2019-05-01 DIAGNOSIS — I5022 Chronic systolic (congestive) heart failure: Secondary | ICD-10-CM | POA: Diagnosis not present

## 2019-05-01 DIAGNOSIS — E854 Organ-limited amyloidosis: Secondary | ICD-10-CM | POA: Diagnosis not present

## 2019-05-07 DIAGNOSIS — I10 Essential (primary) hypertension: Secondary | ICD-10-CM | POA: Diagnosis not present

## 2019-05-07 DIAGNOSIS — Z8673 Personal history of transient ischemic attack (TIA), and cerebral infarction without residual deficits: Secondary | ICD-10-CM | POA: Diagnosis not present

## 2019-05-07 DIAGNOSIS — I2510 Atherosclerotic heart disease of native coronary artery without angina pectoris: Secondary | ICD-10-CM | POA: Diagnosis not present

## 2019-05-07 DIAGNOSIS — M792 Neuralgia and neuritis, unspecified: Secondary | ICD-10-CM | POA: Diagnosis not present

## 2019-05-27 ENCOUNTER — Other Ambulatory Visit: Payer: Self-pay

## 2019-05-27 ENCOUNTER — Encounter: Payer: Medicare Other | Attending: Cardiovascular Disease | Admitting: *Deleted

## 2019-05-27 DIAGNOSIS — I10 Essential (primary) hypertension: Secondary | ICD-10-CM | POA: Insufficient documentation

## 2019-05-27 DIAGNOSIS — Z7902 Long term (current) use of antithrombotics/antiplatelets: Secondary | ICD-10-CM | POA: Insufficient documentation

## 2019-05-27 DIAGNOSIS — Z87891 Personal history of nicotine dependence: Secondary | ICD-10-CM | POA: Insufficient documentation

## 2019-05-27 DIAGNOSIS — Z86711 Personal history of pulmonary embolism: Secondary | ICD-10-CM | POA: Insufficient documentation

## 2019-05-27 DIAGNOSIS — Z951 Presence of aortocoronary bypass graft: Secondary | ICD-10-CM | POA: Insufficient documentation

## 2019-05-27 DIAGNOSIS — Z7289 Other problems related to lifestyle: Secondary | ICD-10-CM | POA: Insufficient documentation

## 2019-05-27 DIAGNOSIS — E1151 Type 2 diabetes mellitus with diabetic peripheral angiopathy without gangrene: Secondary | ICD-10-CM | POA: Insufficient documentation

## 2019-05-27 DIAGNOSIS — Z79899 Other long term (current) drug therapy: Secondary | ICD-10-CM | POA: Insufficient documentation

## 2019-05-27 DIAGNOSIS — I214 Non-ST elevation (NSTEMI) myocardial infarction: Secondary | ICD-10-CM | POA: Insufficient documentation

## 2019-05-27 DIAGNOSIS — Z7901 Long term (current) use of anticoagulants: Secondary | ICD-10-CM | POA: Insufficient documentation

## 2019-05-27 DIAGNOSIS — Z7982 Long term (current) use of aspirin: Secondary | ICD-10-CM | POA: Insufficient documentation

## 2019-05-27 NOTE — Progress Notes (Signed)
Damon English called and has been cleared to start rehab. He is down to 3-4 NTG a day versus 8-9.  His doctor would like for him to go ahead and get started with the program.

## 2019-05-28 ENCOUNTER — Encounter: Payer: Self-pay | Admitting: *Deleted

## 2019-05-28 ENCOUNTER — Encounter: Payer: Medicare Other | Admitting: *Deleted

## 2019-05-28 DIAGNOSIS — I214 Non-ST elevation (NSTEMI) myocardial infarction: Secondary | ICD-10-CM

## 2019-05-28 DIAGNOSIS — Z951 Presence of aortocoronary bypass graft: Secondary | ICD-10-CM

## 2019-05-28 NOTE — Progress Notes (Signed)
Initial Virtual Orientation completed today  8/11 appt for EP/RD evals and gym orientation

## 2019-05-29 ENCOUNTER — Encounter: Payer: Self-pay | Admitting: *Deleted

## 2019-05-31 ENCOUNTER — Encounter: Payer: Medicare Other | Admitting: *Deleted

## 2019-05-31 ENCOUNTER — Other Ambulatory Visit: Payer: Self-pay

## 2019-05-31 VITALS — Ht 66.8 in | Wt 188.5 lb

## 2019-05-31 DIAGNOSIS — Z7982 Long term (current) use of aspirin: Secondary | ICD-10-CM | POA: Diagnosis not present

## 2019-05-31 DIAGNOSIS — I10 Essential (primary) hypertension: Secondary | ICD-10-CM | POA: Diagnosis not present

## 2019-05-31 DIAGNOSIS — Z951 Presence of aortocoronary bypass graft: Secondary | ICD-10-CM

## 2019-05-31 DIAGNOSIS — Z7902 Long term (current) use of antithrombotics/antiplatelets: Secondary | ICD-10-CM | POA: Diagnosis not present

## 2019-05-31 DIAGNOSIS — Z7289 Other problems related to lifestyle: Secondary | ICD-10-CM | POA: Diagnosis not present

## 2019-05-31 DIAGNOSIS — Z87891 Personal history of nicotine dependence: Secondary | ICD-10-CM | POA: Diagnosis not present

## 2019-05-31 DIAGNOSIS — E1151 Type 2 diabetes mellitus with diabetic peripheral angiopathy without gangrene: Secondary | ICD-10-CM | POA: Diagnosis not present

## 2019-05-31 DIAGNOSIS — Z7901 Long term (current) use of anticoagulants: Secondary | ICD-10-CM | POA: Diagnosis not present

## 2019-05-31 DIAGNOSIS — I214 Non-ST elevation (NSTEMI) myocardial infarction: Secondary | ICD-10-CM | POA: Diagnosis not present

## 2019-05-31 DIAGNOSIS — Z86711 Personal history of pulmonary embolism: Secondary | ICD-10-CM | POA: Diagnosis not present

## 2019-05-31 DIAGNOSIS — Z79899 Other long term (current) drug therapy: Secondary | ICD-10-CM | POA: Diagnosis not present

## 2019-05-31 NOTE — Patient Instructions (Signed)
Patient Instructions  Patient Details  Name: Damon English MRN: [id number] Date of Birth: 10-16-1951 Referring Provider:  Lolita Patella, MD  Below are your personal goals for exercise, nutrition, and risk factors. Our goal is to help you stay on track towards obtaining and maintaining these goals. We will be discussing your progress on these goals with you throughout the program.  Initial Exercise Prescription: Initial Exercise Prescription - 05/31/19 1200      Date of Initial Exercise RX and Referring Provider   Date  05/31/19    Referring Provider  Cloretta Ned MD      Treadmill   MPH  0.8    Grade  0    Minutes  15    METs  1.6      NuStep   Level  1    SPM  80    Minutes  15    METs  1.5      REL-XR   Level  1    Speed  50    Minutes  15    METs  1.5      T5 Nustep   Level  1    SPM  80    Minutes  15    METs  1.5      Prescription Details   Frequency (times per week)  2    Duration  Progress to 30 minutes of continuous aerobic without signs/symptoms of physical distress      Intensity   THRR 40-80% of Max Heartrate  122-143    Ratings of Perceived Exertion  11-13    Perceived Dyspnea  0-4      Progression   Progression  Continue to progress workloads to maintain intensity without signs/symptoms of physical distress.      Resistance Training   Training Prescription  Yes    Weight  3 lbs    Reps  10-15       Exercise Goals: Frequency: Be able to perform aerobic exercise two to three times per week in program working toward 2-5 days per week of home exercise.  Intensity: Work with a perceived exertion of 11 (fairly light) - 15 (hard) while following your exercise prescription.  We will make changes to your prescription with you as you progress through the program.   Duration: Be able to do 30 to 45 minutes of continuous aerobic exercise in addition to a 5 minute warm-up and a 5 minute cool-down routine.   Nutrition Goals: Your personal  nutrition goals will be established when you do your nutrition analysis with the dietician.  The following are general nutrition guidelines to follow: Cholesterol < 200mg /day Sodium < 1500mg /day Fiber: Men over 50 yrs - 30 grams per day  Personal Goals: Personal Goals and Risk Factors at Admission - 05/31/19 1242      Core Components/Risk Factors/Patient Goals on Admission    Weight Management  Yes;Weight Loss    Intervention  Weight Management: Develop a combined nutrition and exercise program designed to reach desired caloric intake, while maintaining appropriate intake of nutrient and fiber, sodium and fats, and appropriate energy expenditure required for the weight goal.;Weight Management: Provide education and appropriate resources to help participant work on and attain dietary goals.    Admit Weight  188 lb 8 oz (85.5 kg)    Goal Weight: Short Term  183 lb (83 kg)    Goal Weight: Long Term  180 lb (81.6 kg)    Expected Outcomes  Short  Term: Continue to assess and modify interventions until short term weight is achieved;Long Term: Adherence to nutrition and physical activity/exercise program aimed toward attainment of established weight goal;Weight Loss: Understanding of general recommendations for a balanced deficit meal plan, which promotes 1-2 lb weight loss per week and includes a negative energy balance of [phone] kcal/d;Understanding recommendations for meals to include 15-35% energy as protein, 25-35% energy from fat, 35-60% energy from carbohydrates, less than 200mg  of dietary cholesterol, 20-35 gm of total fiber daily;Understanding of distribution of calorie intake throughout the day with the consumption of 4-5 meals/snacks    Diabetes  Yes    Intervention  Provide education about signs/symptoms and action to take for hypo/hyperglycemia.;Provide education about proper nutrition, including hydration, and aerobic/resistive exercise prescription along with prescribed medications to  achieve blood glucose in normal ranges: Fasting glucose 65-99 mg/dL    Expected Outcomes  Short Term: Participant verbalizes understanding of the signs/symptoms and immediate care of hyper/hypoglycemia, proper foot care and importance of medication, aerobic/resistive exercise and nutrition plan for blood glucose control.;Long Term: Attainment of HbA1C < 7%.    Hypertension  Yes    Intervention  Provide education on lifestyle modifcations including regular physical activity/exercise, weight management, moderate sodium restriction and increased consumption of fresh fruit, vegetables, and low fat dairy, alcohol moderation, and smoking cessation.;Monitor prescription use compliance.    Expected Outcomes  Short Term: Continued assessment and intervention until BP is < 140/10mm HG in hypertensive participants. < 130/55mm HG in hypertensive participants with diabetes, heart failure or chronic kidney disease.;Long Term: Maintenance of blood pressure at goal levels.    Lipids  Yes    Intervention  Provide education and support for participant on nutrition & aerobic/resistive exercise along with prescribed medications to achieve LDL 70mg , HDL >40mg .    Expected Outcomes  Short Term: Participant states understanding of desired cholesterol values and is compliant with medications prescribed. Participant is following exercise prescription and nutrition guidelines.;Long Term: Cholesterol controlled with medications as prescribed, with individualized exercise RX and with personalized nutrition plan. Value goals: LDL < 70mg , HDL > 40 mg.       Tobacco Use Initial Evaluation: Social History   Tobacco Use  Smoking Status Former Smoker  . Packs/day: 2.00  . Years: 40.00  . Pack years: 80.00  . Quit date: 11/27/2010  . Years since quitting: 8.5  Smokeless Tobacco Never Used  Tobacco Comment   Quit in 2012    Exercise Goals and Review: Exercise Goals    Row Name 05/31/19 1241             Exercise Goals    Increase Physical Activity  Yes       Intervention  Provide advice, education, support and counseling about physical activity/exercise needs.;Develop an individualized exercise prescription for aerobic and resistive training based on initial evaluation findings, risk stratification, comorbidities and participant's personal goals.       Expected Outcomes  Short Term: Attend rehab on a regular basis to increase amount of physical activity.;Long Term: Add in home exercise to make exercise part of routine and to increase amount of physical activity.;Long Term: Exercising regularly at least 3-5 days a week.       Increase Strength and Stamina  Yes       Intervention  Provide advice, education, support and counseling about physical activity/exercise needs.;Develop an individualized exercise prescription for aerobic and resistive training based on initial evaluation findings, risk stratification, comorbidities and participant's personal goals.  Expected Outcomes  Short Term: Increase workloads from initial exercise prescription for resistance, speed, and METs.;Short Term: Perform resistance training exercises routinely during rehab and add in resistance training at home;Long Term: Improve cardiorespiratory fitness, muscular endurance and strength as measured by increased METs and functional capacity (6MWT)       Able to understand and use rate of perceived exertion (RPE) scale  Yes       Intervention  Provide education and explanation on how to use RPE scale       Expected Outcomes  Short Term: Able to use RPE daily in rehab to express subjective intensity level;Long Term:  Able to use RPE to guide intensity level when exercising independently       Able to understand and use Dyspnea scale  Yes       Intervention  Provide education and explanation on how to use Dyspnea scale       Expected Outcomes  Short Term: Able to use Dyspnea scale daily in rehab to express subjective sense of shortness of breath  during exertion;Long Term: Able to use Dyspnea scale to guide intensity level when exercising independently       Knowledge and understanding of Target Heart Rate Range (THRR)  Yes       Intervention  Provide education and explanation of THRR including how the numbers were predicted and where they are located for reference       Expected Outcomes  Short Term: Able to state/look up THRR;Short Term: Able to use daily as guideline for intensity in rehab;Long Term: Able to use THRR to govern intensity when exercising independently       Able to check pulse independently  Yes       Intervention  Provide education and demonstration on how to check pulse in carotid and radial arteries.;Review the importance of being able to check your own pulse for safety during independent exercise       Expected Outcomes  Short Term: Able to explain why pulse checking is important during independent exercise;Long Term: Able to check pulse independently and accurately       Understanding of Exercise Prescription  Yes       Intervention  Provide education, explanation, and written materials on patient's individual exercise prescription       Expected Outcomes  Short Term: Able to explain program exercise prescription;Long Term: Able to explain home exercise prescription to exercise independently          Copy of goals given to participant.

## 2019-05-31 NOTE — Progress Notes (Signed)
Cardiac Individual Treatment Plan  Patient Details  Name: Damon English MRN: [id number] Date of Birth: 02-Sep-1951 Referring Provider:     Cardiac Rehab from 05/31/2019 in [facility] Cardiac and Pulmonary Rehab  Referring Provider  Cloretta Ned MD      Initial Encounter Date:    Cardiac Rehab from 05/31/2019 in [facility] Cardiac and Pulmonary Rehab  Date  05/31/19      Visit Diagnosis: NSTEMI (non-ST elevation myocardial infarction) ([place])  S/P CABG x 3  Patient's Home Medications on Admission:  Current Outpatient Medications:  .  apixaban (ELIQUIS) 5 MG TABS tablet, Take by mouth., Disp: , Rfl:  .  aspirin 81 MG chewable tablet, Chew by mouth., Disp: , Rfl:  .  atorvastatin (LIPITOR) 80 MG tablet, Take by mouth., Disp: , Rfl:  .  clonazePAM (KLONOPIN) 0.5 MG tablet, Take 0.25-0.5 mg by mouth 3 (three) times daily as needed for anxiety. , Disp: , Rfl:  .  clopidogrel (PLAVIX) 75 MG tablet, Take 75 mg by mouth daily., Disp: , Rfl:  .  DULoxetine (CYMBALTA) 60 MG capsule, Take 1 capsule (60 mg total) by mouth 2 (two) times daily. (Patient not taking: Reported on 03/27/2019), Disp: 20 capsule, Rfl: 0 .  esomeprazole (NEXIUM) 40 MG capsule, Take 1 capsule by mouth daily., Disp: , Rfl:  .  famotidine (PEPCID) 40 MG tablet, Take 1 tablet by mouth every morning., Disp: , Rfl:  .  HYDROcodone-acetaminophen (NORCO/VICODIN) 5-325 MG tablet, Take 0.5-1 tablets by mouth 2 (two) times daily as needed for pain., Disp: , Rfl:  .  Latanoprost 0.005 % EMUL, Apply to eye., Disp: , Rfl:  .  Magnesium (V-R MAGNESIUM) 250 MG TABS, Take by mouth., Disp: , Rfl:  .  metoprolol succinate (TOPROL-XL) 50 MG 24 hr tablet, Take 50 mg by mouth 2 (two) times a day. Take with or immediately following a meal., Disp: , Rfl:  .  ramipril (ALTACE) 2.5 MG capsule, Take by mouth., Disp: , Rfl:  .  ranolazine (RANEXA) 500 MG 12 hr tablet, Take 500 mg by mouth 2 (two) times daily., Disp: , Rfl:  .  SENNOSIDES PO, Take by mouth.,  Disp: , Rfl:  .  spironolactone (ALDACTONE) 25 MG tablet, Take 25 mg by mouth., Disp: , Rfl:  .  sucralfate (CARAFATE) 1 g tablet, Take 1 tablet by mouth 4 (four) times daily -  before meals and at bedtime., Disp: , Rfl:   Past Medical History: Past Medical History:  Diagnosis Date  . Alcohol use    "cutting back" but still heavy and daily, multiple shots of bourbon each night  . Diabetes mellitus without complication ([place])   . History of tobacco use    reportedly quit in 2013  . Hypertension   . Pulmonary embolism ([place]) 2012   unprovoked  . PVD (peripheral vascular disease) ([place])     Tobacco Use: Social History   Tobacco Use  Smoking Status Former Smoker  . Packs/day: 2.00  . Years: 40.00  . Pack years: 80.00  . Quit date: 11/27/2010  . Years since quitting: 8.5  Smokeless Tobacco Never Used  Tobacco Comment   Quit in 2012    Labs: Recent Review Flowsheet Data    Labs for ITP Cardiac and Pulmonary Rehab Latest Ref Rng & Units 01/28/2019 01/29/2019   Cholestrol 0 - 200 mg/dL 168 143   LDLCALC 0 - 99 mg/dL 113(H) 93   HDL >40 mg/dL 36(L) 33(L)   Trlycerides <150 mg/dL 97  87       Exercise Target Goals: Exercise Program Goal: Individual exercise prescription set using results from initial 6 min walk test and THRR while considering  patient's activity barriers and safety.   Exercise Prescription Goal: Initial exercise prescription builds to 30-45 minutes a day of aerobic activity, 2-3 days per week.  Home exercise guidelines will be given to patient during program as part of exercise prescription that the participant will acknowledge.  Activity Barriers & Risk Stratification: Activity Barriers & Cardiac Risk Stratification - 05/31/19 1238      Activity Barriers & Cardiac Risk Stratification   Activity Barriers  Muscular Weakness;Deconditioning;Back Problems;Other (comment);Chest Pain/Angina;Shortness of Breath;Assistive Device;Balance Concerns;Decreased Ventricular  Function    Comments  R leg numb/pain post-op, Hx back surgery, PAD, r foot numb, uses walker on occasion    Cardiac Risk Stratification  High       6 Minute Walk: 6 Minute Walk    Row Name 05/31/19 1234         6 Minute Walk   Phase  Initial     Distance  370 feet     Walk Time  3.97 minutes     # of Rest Breaks  4 15 sec, 15 sec, 20 sec, stopped at 4:48     MPH  1.06     METS  1.91     RPE  13     Perceived Dyspnea   1     VO2 Peak  6.67     Symptoms  Yes (comment)     Comments  fatigue, SOB, R hip/back support, chest pain 5/10     Resting HR  100 bpm     Resting BP  142/70     Resting Oxygen Saturation   98 %     Exercise Oxygen Saturation  during 6 min walk  96 %     Max Ex. HR  134 bpm     Max Ex. BP  152/74     2 Minute Post BP  146/64        Oxygen Initial Assessment:   Oxygen Re-Evaluation:   Oxygen Discharge (Final Oxygen Re-Evaluation):   Initial Exercise Prescription: Initial Exercise Prescription - 05/31/19 1200      Date of Initial Exercise RX and Referring Provider   Date  05/31/19    Referring Provider  Cloretta Ned MD      Treadmill   MPH  0.8    Grade  0    Minutes  15    METs  1.6      NuStep   Level  1    SPM  80    Minutes  15    METs  1.5      REL-XR   Level  1    Speed  50    Minutes  15    METs  1.5      T5 Nustep   Level  1    SPM  80    Minutes  15    METs  1.5      Prescription Details   Frequency (times per week)  2    Duration  Progress to 30 minutes of continuous aerobic without signs/symptoms of physical distress      Intensity   THRR 40-80% of Max Heartrate  122-143    Ratings of Perceived Exertion  11-13    Perceived Dyspnea  0-4      Progression   Progression  Continue to progress workloads to maintain intensity without signs/symptoms of physical distress.      Resistance Training   Training Prescription  Yes    Weight  3 lbs    Reps  10-15       Perform Capillary Blood Glucose checks as  needed.  Exercise Prescription Changes: Exercise Prescription Changes    Row Name 05/31/19 1200             Response to Exercise   Blood Pressure (Admit)  142/70       Blood Pressure (Exercise)  152/74       Blood Pressure (Exit)  146/64       Heart Rate (Admit)  100 bpm       Heart Rate (Exercise)  134 bpm       Heart Rate (Exit)  93 bpm       Oxygen Saturation (Admit)  98 %       Oxygen Saturation (Exercise)  96 %       Rating of Perceived Exertion (Exercise)  13       Perceived Dyspnea (Exercise)  1       Symptoms  fatigue, SOB, chest pain       Comments  walk test results          Exercise Comments:   Exercise Goals and Review: Exercise Goals    Row Name 05/31/19 1241             Exercise Goals   Increase Physical Activity  Yes       Intervention  Provide advice, education, support and counseling about physical activity/exercise needs.;Develop an individualized exercise prescription for aerobic and resistive training based on initial evaluation findings, risk stratification, comorbidities and participant's personal goals.       Expected Outcomes  Short Term: Attend rehab on a regular basis to increase amount of physical activity.;Long Term: Add in home exercise to make exercise part of routine and to increase amount of physical activity.;Long Term: Exercising regularly at least 3-5 days a week.       Increase Strength and Stamina  Yes       Intervention  Provide advice, education, support and counseling about physical activity/exercise needs.;Develop an individualized exercise prescription for aerobic and resistive training based on initial evaluation findings, risk stratification, comorbidities and participant's personal goals.       Expected Outcomes  Short Term: Increase workloads from initial exercise prescription for resistance, speed, and METs.;Short Term: Perform resistance training exercises routinely during rehab and add in resistance training at home;Long  Term: Improve cardiorespiratory fitness, muscular endurance and strength as measured by increased METs and functional capacity (6MWT)       Able to understand and use rate of perceived exertion (RPE) scale  Yes       Intervention  Provide education and explanation on how to use RPE scale       Expected Outcomes  Short Term: Able to use RPE daily in rehab to express subjective intensity level;Long Term:  Able to use RPE to guide intensity level when exercising independently       Able to understand and use Dyspnea scale  Yes       Intervention  Provide education and explanation on how to use Dyspnea scale       Expected Outcomes  Short Term: Able to use Dyspnea scale daily in rehab to express subjective sense of shortness of breath during exertion;Long Term: Able to use Dyspnea scale  to guide intensity level when exercising independently       Knowledge and understanding of Target Heart Rate Range (THRR)  Yes       Intervention  Provide education and explanation of THRR including how the numbers were predicted and where they are located for reference       Expected Outcomes  Short Term: Able to state/look up THRR;Short Term: Able to use daily as guideline for intensity in rehab;Long Term: Able to use THRR to govern intensity when exercising independently       Able to check pulse independently  Yes       Intervention  Provide education and demonstration on how to check pulse in carotid and radial arteries.;Review the importance of being able to check your own pulse for safety during independent exercise       Expected Outcomes  Short Term: Able to explain why pulse checking is important during independent exercise;Long Term: Able to check pulse independently and accurately       Understanding of Exercise Prescription  Yes       Intervention  Provide education, explanation, and written materials on patient's individual exercise prescription       Expected Outcomes  Short Term: Able to explain program  exercise prescription;Long Term: Able to explain home exercise prescription to exercise independently          Exercise Goals Re-Evaluation : Exercise Goals Re-Evaluation    Row Name 04/30/19 1340             Exercise Goal Re-Evaluation   Exercise Goals Review  Increase Physical Activity;Increase Strength and Stamina;Understanding of Exercise Prescription       Comments  Vue has not been able to exercise per doctor's orders due to ongoing angina.  We will continue to follow progression.       Expected Outcomes  Short: Get clearance to exercise.  Long: Cleared to start rehab.          Discharge Exercise Prescription (Final Exercise Prescription Changes): Exercise Prescription Changes - 05/31/19 1200      Response to Exercise   Blood Pressure (Admit)  142/70    Blood Pressure (Exercise)  152/74    Blood Pressure (Exit)  146/64    Heart Rate (Admit)  100 bpm    Heart Rate (Exercise)  134 bpm    Heart Rate (Exit)  93 bpm    Oxygen Saturation (Admit)  98 %    Oxygen Saturation (Exercise)  96 %    Rating of Perceived Exertion (Exercise)  13    Perceived Dyspnea (Exercise)  1    Symptoms  fatigue, SOB, chest pain    Comments  walk test results       Nutrition:  Target Goals: Understanding of nutrition guidelines, daily intake of sodium '1500mg'$ , cholesterol '200mg'$ , calories 30% from fat and 7% or less from saturated fats, daily to have 5 or more servings of fruits and vegetables.  Biometrics: Pre Biometrics - 05/31/19 1241      Pre Biometrics   Height  5' 6.8" (1.697 m)    Weight  188 lb 8 oz (85.5 kg)    BMI (Calculated)  29.69    Single Leg Stand  1.44 seconds        Nutrition Therapy Plan and Nutrition Goals: Nutrition Therapy & Goals - 04/15/19 1334      Nutrition Therapy   Diet  HH, low Na diet    Protein (specify units)  75g  Fiber  25 grams    Whole Grain Foods  3 servings    Saturated Fats  12 max. grams    Fruits and Vegetables  5 servings/day     Sodium  1.5 grams      Personal Nutrition Goals   Nutrition Goal  ST: switch out 1 cup of lemonade for 1 cup of >/=75% water & </=25% lemonade LT: maintain wt, keep HgA1c down    Comments  1.5-2 y ago 240lbs, now 185lbs (self reported); 206lbs (per chart). HgA1c: 6.1-6.5. T2DM controlled with diet (cut CHOs) B: Non-fat creamer & splenda; lost appetite for coffee in the hospital, now drinks decaf 2cups. B: fruits and/or cereal (cheerios) with fruit and almond milk. L: low CHO beer, (1/2 [place] sandwich w/ a bit of lite mayo and lettuce and tomato) and whole wheat pasta with zoodles and sauce. 2-3x/week will have hamburger (no cheese) on whole wheat bread or hamburger steak with muschrooms and onions. Pt reports watching sodium, limited fried food (every now and again will have fried chicken), lemonade (no cal, no sugar - splenda instead). D: vegetable soup (beans and lots of leafy greens). Will eat poultry and shrimp and scallops.      Intervention Plan   Intervention  Prescribe, educate and counsel regarding individualized specific dietary modifications aiming towards targeted core components such as weight, hypertension, lipid management, diabetes, heart failure and other comorbidities.;Nutrition handout(s) given to patient.   emailed pt handout   Expected Outcomes  Short Term Goal: Understand basic principles of dietary content, such as calories, fat, sodium, cholesterol and nutrients.;Short Term Goal: A plan has been developed with personal nutrition goals set during dietitian appointment.;Long Term Goal: Adherence to prescribed nutrition plan.       Nutrition Assessments: Nutrition Assessments - 05/29/19 1445      MEDFICTS Scores   Pre Score  12       Nutrition Goals Re-Evaluation:   Nutrition Goals Discharge (Final Nutrition Goals Re-Evaluation):   Psychosocial: Target Goals: Acknowledge presence or absence of significant depression and/or stress, maximize coping skills, provide  positive support system. Participant is able to verbalize types and ability to use techniques and skills needed for reducing stress and depression.   Initial Review & Psychosocial Screening: Initial Psych Review & Screening - 05/28/19 1343      Initial Review   Current issues with  None Identified   Is taking Klonopin after several hospitalizations.  Med prescribed after all this and he feels he is doing good     Family Dynamics   Good Support System?  Yes   Wife     Barriers   Psychosocial barriers to participate in program  There are no identifiable barriers or psychosocial needs.;The patient should benefit from training in stress management and relaxation.      Screening Interventions   Interventions  Encouraged to exercise;Provide feedback about the scores to participant;To provide support and resources with identified psychosocial needs    Expected Outcomes  Short Term goal: Utilizing psychosocial counselor, staff and physician to assist with identification of specific Stressors or current issues interfering with healing process. Setting desired goal for each stressor or current issue identified.;Long Term Goal: Stressors or current issues are controlled or eliminated.;Short Term goal: Identification and review with participant of any Quality of Life or Depression concerns found by scoring the questionnaire.;Long Term goal: The participant improves quality of Life and PHQ9 Scores as seen by post scores and/or verbalization of changes  Quality of Life Scores:  Quality of Life - 05/29/19 1442      Quality of Life   Select  Quality of Life      Quality of Life Scores   Health/Function Pre  8.6 %    Socioeconomic Pre  26.43 %    Psych/Spiritual Pre  15.57 %    Family Pre  25.2 %    GLOBAL Pre  16.44 %      Scores of 19 and below usually indicate a poorer quality of life in these areas.  A difference of  2-3 points is a clinically meaningful difference.  A difference of 2-3  points in the total score of the Quality of Life Index has been associated with significant improvement in overall quality of life, self-image, physical symptoms, and general health in studies assessing change in quality of life.  PHQ-9: Recent Review Flowsheet Data    Depression screen [facility] 2/9 05/31/2019   Decreased Interest 0   Down, Depressed, Hopeless 1   PHQ - 2 Score 1   Altered sleeping 3   Tired, decreased energy 3   Change in appetite 0   Feeling bad or failure about yourself  0   Trouble concentrating 0   Moving slowly or fidgety/restless 0   Suicidal thoughts 0   PHQ-9 Score 7   Difficult doing work/chores Somewhat difficult     Interpretation of Total Score  Total Score Depression Severity:  1-4 = Minimal depression, 5-9 = Mild depression, 10-14 = Moderate depression, 15-19 = Moderately severe depression, 20-27 = Severe depression   Psychosocial Evaluation and Intervention:   Psychosocial Re-Evaluation:   Psychosocial Discharge (Final Psychosocial Re-Evaluation):   Vocational Rehabilitation: Provide vocational rehab assistance to qualifying candidates.   Vocational Rehab Evaluation & Intervention: Vocational Rehab - 05/28/19 1348      Initial Vocational Rehab Evaluation & Intervention   Assessment shows need for Vocational Rehabilitation  No       Education: Education Goals: Education classes will be provided on a variety of topics geared toward better understanding of heart health and risk factor modification. Participant will state understanding/return demonstration of topics presented as noted by education test scores.  Learning Barriers/Preferences:   Education Topics:  AED/CPR: - Group verbal and written instruction with the use of models to demonstrate the basic use of the AED with the basic ABC's of resuscitation.   General Nutrition Guidelines/Fats and Fiber: -Group instruction provided by verbal, written material, models and posters to  present the general guidelines for heart healthy nutrition. Gives an explanation and review of dietary fats and fiber.   Controlling Sodium/Reading Food Labels: -Group verbal and written material supporting the discussion of sodium use in heart healthy nutrition. Review and explanation with models, verbal and written materials for utilization of the food label.   Exercise Physiology & General Exercise Guidelines: - Group verbal and written instruction with models to review the exercise physiology of the cardiovascular system and associated critical values. Provides general exercise guidelines with specific guidelines to those with heart or lung disease.    Aerobic Exercise & Resistance Training: - Gives group verbal and written instruction on the various components of exercise. Focuses on aerobic and resistive training programs and the benefits of this training and how to safely progress through these programs..   Flexibility, Balance, Mind/Body Relaxation: Provides group verbal/written instruction on the benefits of flexibility and balance training, including mind/body exercise modes such as yoga, pilates and tai chi.  Demonstration and  skill practice provided.   Stress and Anxiety: - Provides group verbal and written instruction about the health risks of elevated stress and causes of high stress.  Discuss the correlation between heart/lung disease and anxiety and treatment options. Review healthy ways to manage with stress and anxiety.   Depression: - Provides group verbal and written instruction on the correlation between heart/lung disease and depressed mood, treatment options, and the stigmas associated with seeking treatment.   Anatomy & Physiology of the Heart: - Group verbal and written instruction and models provide basic cardiac anatomy and physiology, with the coronary electrical and arterial systems. Review of Valvular disease and Heart Failure   Cardiac Procedures: -  Group verbal and written instruction to review commonly prescribed medications for heart disease. Reviews the medication, class of the drug, and side effects. Includes the steps to properly store meds and maintain the prescription regimen. (beta blockers and nitrates)   Cardiac Medications I: - Group verbal and written instruction to review commonly prescribed medications for heart disease. Reviews the medication, class of the drug, and side effects. Includes the steps to properly store meds and maintain the prescription regimen.   Cardiac Medications II: -Group verbal and written instruction to review commonly prescribed medications for heart disease. Reviews the medication, class of the drug, and side effects. (all other drug classes)    Go Sex-Intimacy & Heart Disease, Get SMART - Goal Setting: - Group verbal and written instruction through game format to discuss heart disease and the return to sexual intimacy. Provides group verbal and written material to discuss and apply goal setting through the application of the S.M.A.R.T. Method.   Other Matters of the Heart: - Provides group verbal, written materials and models to describe Stable Angina and Peripheral Artery. Includes description of the disease process and treatment options available to the cardiac patient.   Exercise & Equipment Safety: - Individual verbal instruction and demonstration of equipment use and safety with use of the equipment.   Cardiac Rehab from 05/31/2019 in [facility] Cardiac and Pulmonary Rehab  Date  05/31/19  Educator  [facility]  Instruction Review Code  1- Verbalizes Understanding      Infection Prevention: - Provides verbal and written material to individual with discussion of infection control including proper hand washing and proper equipment cleaning during exercise session.   Cardiac Rehab from 05/31/2019 in [facility] Cardiac and Pulmonary Rehab  Date  05/31/19  Educator  [facility]  Instruction Review Code  1- Verbalizes  Understanding      Falls Prevention: - Provides verbal and written material to individual with discussion of falls prevention and safety.   Cardiac Rehab from 05/31/2019 in [facility] Cardiac and Pulmonary Rehab  Date  05/31/19  Educator  [facility]  Instruction Review Code  1- Verbalizes Understanding      Diabetes: - Individual verbal and written instruction to review signs/symptoms of diabetes, desired ranges of glucose level fasting, after meals and with exercise. Acknowledge that pre and post exercise glucose checks will be done for 3 sessions at entry of program.   Know Your Numbers and Risk Factors: -Group verbal and written instruction about important numbers in your health.  Discussion of what are risk factors and how they play a role in the disease process.  Review of Cholesterol, Blood Pressure, Diabetes, and BMI and the role they play in your overall health.   Sleep Hygiene: -Provides group verbal and written instruction about how sleep can affect your health.  Define sleep hygiene, discuss sleep  cycles and impact of sleep habits. Review good sleep hygiene tips.    Other: -Provides group and verbal instruction on various topics (see comments)   Knowledge Questionnaire Score: Knowledge Questionnaire Score - 05/29/19 1445      Knowledge Questionnaire Score   Pre Score  23/26   Education Focus: Nutrition and Exercise      Core Components/Risk Factors/Patient Goals at Admission: Personal Goals and Risk Factors at Admission - 05/31/19 1242      Core Components/Risk Factors/Patient Goals on Admission    Weight Management  Yes;Weight Loss    Intervention  Weight Management: Develop a combined nutrition and exercise program designed to reach desired caloric intake, while maintaining appropriate intake of nutrient and fiber, sodium and fats, and appropriate energy expenditure required for the weight goal.;Weight Management: Provide education and appropriate resources to help  participant work on and attain dietary goals.    Admit Weight  188 lb 8 oz (85.5 kg)    Goal Weight: Short Term  183 lb (83 kg)    Goal Weight: Long Term  180 lb (81.6 kg)    Expected Outcomes  Short Term: Continue to assess and modify interventions until short term weight is achieved;Long Term: Adherence to nutrition and physical activity/exercise program aimed toward attainment of established weight goal;Weight Loss: Understanding of general recommendations for a balanced deficit meal plan, which promotes 1-2 lb weight loss per week and includes a negative energy balance of [phone] kcal/d;Understanding recommendations for meals to include 15-35% energy as protein, 25-35% energy from fat, 35-60% energy from carbohydrates, less than '200mg'$  of dietary cholesterol, 20-35 gm of total fiber daily;Understanding of distribution of calorie intake throughout the day with the consumption of 4-5 meals/snacks    Diabetes  Yes    Intervention  Provide education about signs/symptoms and action to take for hypo/hyperglycemia.;Provide education about proper nutrition, including hydration, and aerobic/resistive exercise prescription along with prescribed medications to achieve blood glucose in normal ranges: Fasting glucose 65-99 mg/dL    Expected Outcomes  Short Term: Participant verbalizes understanding of the signs/symptoms and immediate care of hyper/hypoglycemia, proper foot care and importance of medication, aerobic/resistive exercise and nutrition plan for blood glucose control.;Long Term: Attainment of HbA1C < 7%.    Hypertension  Yes    Intervention  Provide education on lifestyle modifcations including regular physical activity/exercise, weight management, moderate sodium restriction and increased consumption of fresh fruit, vegetables, and low fat dairy, alcohol moderation, and smoking cessation.;Monitor prescription use compliance.    Expected Outcomes  Short Term: Continued assessment and intervention until  BP is < 140/10m HG in hypertensive participants. < 130/863mHG in hypertensive participants with diabetes, heart failure or chronic kidney disease.;Long Term: Maintenance of blood pressure at goal levels.    Lipids  Yes    Intervention  Provide education and support for participant on nutrition & aerobic/resistive exercise along with prescribed medications to achieve LDL '70mg'$ , HDL >'40mg'$ .    Expected Outcomes  Short Term: Participant states understanding of desired cholesterol values and is compliant with medications prescribed. Participant is following exercise prescription and nutrition guidelines.;Long Term: Cholesterol controlled with medications as prescribed, with individualized exercise RX and with personalized nutrition plan. Value goals: LDL < '70mg'$ , HDL > 40 mg.       Core Components/Risk Factors/Patient Goals Review:    Core Components/Risk Factors/Patient Goals at Discharge (Final Review):    ITP Comments: ITP Comments    Row Name 03/27/19 1100 04/15/19 1304 04/30/19 1335 05/27/19 1336 05/28/19 1400  ITP Comments  Initial Consent and Intake completed for entry into the Virtual Home Based Program. Will also use [employer] App. Appts made for EP and RD.   Completed initial ExRx created and sent to Dr. Emily Filbert, Medical Director to review and sign.  Completed Virtual Visit.  Jalen is still having chest pain and BP issues.  He has a follow up appt tomorrow and will call afterwards with an update.  See progress notes for 04/30/19.  Kobyn called and has been cleared to start rehab. He is down to 3-4 NTG a day versus 8-9.  His doctor would like for him to go ahead and get started with the program.  Virtual Orientation to program completed today.  Documentation of diagnosis can be found in [facility] 4/15   Row Name 05/31/19 1222           ITP Comments  Completed 6MWT and gym orientation. Initial ITP created and sent for review to Dr. Emily Filbert, Medical Director.          Comments:  Initial ITP

## 2019-06-04 ENCOUNTER — Other Ambulatory Visit: Payer: Self-pay

## 2019-06-04 DIAGNOSIS — Z7289 Other problems related to lifestyle: Secondary | ICD-10-CM | POA: Diagnosis not present

## 2019-06-04 DIAGNOSIS — Z86711 Personal history of pulmonary embolism: Secondary | ICD-10-CM | POA: Diagnosis not present

## 2019-06-04 DIAGNOSIS — Z7982 Long term (current) use of aspirin: Secondary | ICD-10-CM | POA: Diagnosis not present

## 2019-06-04 DIAGNOSIS — Z951 Presence of aortocoronary bypass graft: Secondary | ICD-10-CM

## 2019-06-04 DIAGNOSIS — I10 Essential (primary) hypertension: Secondary | ICD-10-CM | POA: Diagnosis not present

## 2019-06-04 DIAGNOSIS — Z7902 Long term (current) use of antithrombotics/antiplatelets: Secondary | ICD-10-CM | POA: Diagnosis not present

## 2019-06-04 DIAGNOSIS — Z79899 Other long term (current) drug therapy: Secondary | ICD-10-CM | POA: Diagnosis not present

## 2019-06-04 DIAGNOSIS — E1151 Type 2 diabetes mellitus with diabetic peripheral angiopathy without gangrene: Secondary | ICD-10-CM | POA: Diagnosis not present

## 2019-06-04 DIAGNOSIS — I214 Non-ST elevation (NSTEMI) myocardial infarction: Secondary | ICD-10-CM | POA: Diagnosis not present

## 2019-06-04 DIAGNOSIS — Z87891 Personal history of nicotine dependence: Secondary | ICD-10-CM | POA: Diagnosis not present

## 2019-06-04 DIAGNOSIS — Z7901 Long term (current) use of anticoagulants: Secondary | ICD-10-CM | POA: Diagnosis not present

## 2019-06-04 LAB — GLUCOSE, CAPILLARY
Glucose-Capillary: 97 mg/dL (ref 70–99)
Glucose-Capillary: 92 mg/dL (ref 70–99)

## 2019-06-04 NOTE — Progress Notes (Signed)
Daily Session Note  Patient Details  Name: Damon English MRN: [id number] Date of Birth: 10-Sep-1951 Referring Provider:     Cardiac Rehab from 05/31/2019 in [facility] Cardiac and Pulmonary Rehab  Referring Provider  Cloretta Ned MD      Encounter Date: 06/04/2019  Check In:      Social History   Tobacco Use  Smoking Status Former Smoker  . Packs/day: 2.00  . Years: 40.00  . Pack years: 80.00  . Quit date: 11/27/2010  . Years since quitting: 8.5  Smokeless Tobacco Never Used  Tobacco Comment   Quit in 2012    Goals Met:  Independence with exercise equipment Exercise tolerated well No report of cardiac concerns or symptoms Strength training completed today  Goals Unmet:  Not Applicable  Comments: Pt able to follow exercise prescription today without complaint.  Will continue to monitor for progression.    Dr. Emily Filbert is Medical Director for [place] and [facility].

## 2019-06-06 ENCOUNTER — Other Ambulatory Visit: Payer: Self-pay

## 2019-06-06 ENCOUNTER — Encounter: Payer: Medicare Other | Admitting: *Deleted

## 2019-06-06 DIAGNOSIS — Z7901 Long term (current) use of anticoagulants: Secondary | ICD-10-CM | POA: Diagnosis not present

## 2019-06-06 DIAGNOSIS — Z87891 Personal history of nicotine dependence: Secondary | ICD-10-CM | POA: Diagnosis not present

## 2019-06-06 DIAGNOSIS — Z7902 Long term (current) use of antithrombotics/antiplatelets: Secondary | ICD-10-CM | POA: Diagnosis not present

## 2019-06-06 DIAGNOSIS — Z951 Presence of aortocoronary bypass graft: Secondary | ICD-10-CM

## 2019-06-06 DIAGNOSIS — Z86711 Personal history of pulmonary embolism: Secondary | ICD-10-CM | POA: Diagnosis not present

## 2019-06-06 DIAGNOSIS — I10 Essential (primary) hypertension: Secondary | ICD-10-CM | POA: Diagnosis not present

## 2019-06-06 DIAGNOSIS — E1151 Type 2 diabetes mellitus with diabetic peripheral angiopathy without gangrene: Secondary | ICD-10-CM | POA: Diagnosis not present

## 2019-06-06 DIAGNOSIS — I214 Non-ST elevation (NSTEMI) myocardial infarction: Secondary | ICD-10-CM | POA: Diagnosis not present

## 2019-06-06 DIAGNOSIS — Z7982 Long term (current) use of aspirin: Secondary | ICD-10-CM | POA: Diagnosis not present

## 2019-06-06 DIAGNOSIS — Z79899 Other long term (current) drug therapy: Secondary | ICD-10-CM | POA: Diagnosis not present

## 2019-06-06 DIAGNOSIS — Z7289 Other problems related to lifestyle: Secondary | ICD-10-CM | POA: Diagnosis not present

## 2019-06-06 LAB — GLUCOSE, CAPILLARY
Glucose-Capillary: 136 mg/dL — ABNORMAL HIGH (ref 70–99)
Glucose-Capillary: 109 mg/dL — ABNORMAL HIGH (ref 70–99)

## 2019-06-06 NOTE — Progress Notes (Signed)
Daily Session Note  Patient Details  Name: Damon English MRN: [id number] Date of Birth: 1951-10-12 Referring Provider:     Cardiac Rehab from 05/31/2019 in [facility] Cardiac and Pulmonary Rehab  Referring Provider  Cloretta Ned MD      Encounter Date: 06/06/2019  Check In: Session Check In - 06/06/19 1135      Check-In   Supervising physician immediately available to respond to emergencies  See telemetry face sheet for immediately available ER MD    Location  [facility]-Cardiac & Pulmonary Rehab    Staff Present  Heath Lark, RN, BSN, CCRP;Jeanna Durrell BS, Exercise Physiologist;Amanda Oletta Darter, BA, ACSM CEP, Exercise Physiologist    Virtual Visit  No    Medication changes reported      No    Fall or balance concerns reported     No    Warm-up and Cool-down  Performed on first and last piece of equipment    Resistance Training Performed  Yes    VAD Patient?  No    PAD/SET Patient?  No      Pain Assessment   Currently in Pain?  No/denies          Social History   Tobacco Use  Smoking Status Former Smoker  . Packs/day: 2.00  . Years: 40.00  . Pack years: 80.00  . Quit date: 11/27/2010  . Years since quitting: 8.5  Smokeless Tobacco Never Used  Tobacco Comment   Quit in 2012    Goals Met:  Independence with exercise equipment Exercise tolerated well Strength training completed today  Goals Unmet:  Continues to experience angina symptoms. Has been able to stop, rest and symptoms resolve.   Comments: Pt able to follow exercise prescription today without complaint.  Will continue to monitor for progression.    Dr. Emily Filbert is Medical Director for [place] and [facility].

## 2019-06-07 DIAGNOSIS — I2510 Atherosclerotic heart disease of native coronary artery without angina pectoris: Secondary | ICD-10-CM | POA: Diagnosis not present

## 2019-06-07 DIAGNOSIS — I10 Essential (primary) hypertension: Secondary | ICD-10-CM | POA: Diagnosis not present

## 2019-06-07 DIAGNOSIS — Z79899 Other long term (current) drug therapy: Secondary | ICD-10-CM | POA: Diagnosis not present

## 2019-06-07 DIAGNOSIS — Z8673 Personal history of transient ischemic attack (TIA), and cerebral infarction without residual deficits: Secondary | ICD-10-CM | POA: Diagnosis not present

## 2019-06-07 DIAGNOSIS — E119 Type 2 diabetes mellitus without complications: Secondary | ICD-10-CM | POA: Diagnosis not present

## 2019-06-11 ENCOUNTER — Other Ambulatory Visit: Payer: Self-pay

## 2019-06-11 DIAGNOSIS — I10 Essential (primary) hypertension: Secondary | ICD-10-CM | POA: Diagnosis not present

## 2019-06-11 DIAGNOSIS — Z86711 Personal history of pulmonary embolism: Secondary | ICD-10-CM | POA: Diagnosis not present

## 2019-06-11 DIAGNOSIS — Z951 Presence of aortocoronary bypass graft: Secondary | ICD-10-CM | POA: Diagnosis not present

## 2019-06-11 DIAGNOSIS — Z87891 Personal history of nicotine dependence: Secondary | ICD-10-CM | POA: Diagnosis not present

## 2019-06-11 DIAGNOSIS — Z7289 Other problems related to lifestyle: Secondary | ICD-10-CM | POA: Diagnosis not present

## 2019-06-11 DIAGNOSIS — Z7902 Long term (current) use of antithrombotics/antiplatelets: Secondary | ICD-10-CM | POA: Diagnosis not present

## 2019-06-11 DIAGNOSIS — E1151 Type 2 diabetes mellitus with diabetic peripheral angiopathy without gangrene: Secondary | ICD-10-CM | POA: Diagnosis not present

## 2019-06-11 DIAGNOSIS — I214 Non-ST elevation (NSTEMI) myocardial infarction: Secondary | ICD-10-CM

## 2019-06-11 DIAGNOSIS — Z79899 Other long term (current) drug therapy: Secondary | ICD-10-CM | POA: Diagnosis not present

## 2019-06-11 DIAGNOSIS — Z7982 Long term (current) use of aspirin: Secondary | ICD-10-CM | POA: Diagnosis not present

## 2019-06-11 DIAGNOSIS — Z7901 Long term (current) use of anticoagulants: Secondary | ICD-10-CM | POA: Diagnosis not present

## 2019-06-11 LAB — GLUCOSE, CAPILLARY
Glucose-Capillary: 146 mg/dL — ABNORMAL HIGH (ref 70–99)
Glucose-Capillary: 90 mg/dL (ref 70–99)

## 2019-06-11 NOTE — Progress Notes (Signed)
Daily Session Note  Patient Details  Name: Damon English MRN: [id number] Date of Birth: [birth year] Referring Provider:     Cardiac Rehab from 05/31/2019 in [facility] Cardiac and Pulmonary Rehab  Referring Provider  Cloretta Ned MD      Encounter Date: 06/11/2019  Check In:      Social History   Tobacco Use  Smoking Status Former Smoker  . Packs/day: 2.00  . Years: 40.00  . Pack years: 80.00  . Quit date: 11/27/2010  . Years since quitting: 8.5  Smokeless Tobacco Never Used  Tobacco Comment   Quit in 2012    Goals Met:  Independence with exercise equipment Exercise tolerated well No report of cardiac concerns or symptoms  Goals Unmet:  Not Applicable  Comments: Pt able to follow exercise prescription today without complaint.  Will continue to monitor for progression.    Dr. Emily Filbert is Medical Director for [place] and [facility].

## 2019-06-12 DIAGNOSIS — Z86718 Personal history of other venous thrombosis and embolism: Secondary | ICD-10-CM | POA: Diagnosis not present

## 2019-06-12 DIAGNOSIS — Z8673 Personal history of transient ischemic attack (TIA), and cerebral infarction without residual deficits: Secondary | ICD-10-CM | POA: Diagnosis not present

## 2019-06-12 DIAGNOSIS — I25708 Atherosclerosis of coronary artery bypass graft(s), unspecified, with other forms of angina pectoris: Secondary | ICD-10-CM | POA: Diagnosis not present

## 2019-06-12 DIAGNOSIS — I739 Peripheral vascular disease, unspecified: Secondary | ICD-10-CM | POA: Diagnosis not present

## 2019-06-13 ENCOUNTER — Other Ambulatory Visit: Payer: Self-pay

## 2019-06-13 DIAGNOSIS — Z79899 Other long term (current) drug therapy: Secondary | ICD-10-CM | POA: Diagnosis not present

## 2019-06-13 DIAGNOSIS — Z7902 Long term (current) use of antithrombotics/antiplatelets: Secondary | ICD-10-CM | POA: Diagnosis not present

## 2019-06-13 DIAGNOSIS — I214 Non-ST elevation (NSTEMI) myocardial infarction: Secondary | ICD-10-CM

## 2019-06-13 DIAGNOSIS — Z951 Presence of aortocoronary bypass graft: Secondary | ICD-10-CM | POA: Diagnosis not present

## 2019-06-13 DIAGNOSIS — Z87891 Personal history of nicotine dependence: Secondary | ICD-10-CM | POA: Diagnosis not present

## 2019-06-13 DIAGNOSIS — E1151 Type 2 diabetes mellitus with diabetic peripheral angiopathy without gangrene: Secondary | ICD-10-CM | POA: Diagnosis not present

## 2019-06-13 DIAGNOSIS — I10 Essential (primary) hypertension: Secondary | ICD-10-CM | POA: Diagnosis not present

## 2019-06-13 DIAGNOSIS — Z7901 Long term (current) use of anticoagulants: Secondary | ICD-10-CM | POA: Diagnosis not present

## 2019-06-13 DIAGNOSIS — Z7289 Other problems related to lifestyle: Secondary | ICD-10-CM | POA: Diagnosis not present

## 2019-06-13 DIAGNOSIS — Z7982 Long term (current) use of aspirin: Secondary | ICD-10-CM | POA: Diagnosis not present

## 2019-06-13 DIAGNOSIS — Z86711 Personal history of pulmonary embolism: Secondary | ICD-10-CM | POA: Diagnosis not present

## 2019-06-13 NOTE — Progress Notes (Signed)
Daily Session Note  Patient Details  Name: Damon English MRN: [id number] Date of Birth: 1951/06/09 Referring Provider:     Cardiac Rehab from 05/31/2019 in [facility] Cardiac and Pulmonary Rehab  Referring Provider  Cloretta Ned MD      Encounter Date: 06/13/2019  Check In: Session Check In - 06/13/19 1113      Check-In   Location  [facility]-Cardiac & Pulmonary Rehab    Staff Present  Vida Rigger RN, Vickki Hearing, BA, ACSM CEP, Exercise Physiologist;Jeanna Durrell BS, Exercise Physiologist    Virtual Visit  No    Medication changes reported      No    Fall or balance concerns reported     No    Warm-up and Cool-down  Performed on first and last piece of equipment    Resistance Training Performed  Yes    VAD Patient?  No    PAD/SET Patient?  No      Pain Assessment   Currently in Pain?  No/denies          Social History   Tobacco Use  Smoking Status Former Smoker  . Packs/day: 2.00  . Years: 40.00  . Pack years: 80.00  . Quit date: 11/27/2010  . Years since quitting: 8.5  Smokeless Tobacco Never Used  Tobacco Comment   Quit in 2012    Goals Met:  Exercise tolerated well Personal goals reviewed No report of cardiac concerns or symptoms Strength training completed today  Goals Unmet:  Not Applicable  Comments: Pt able to follow exercise prescription today without complaint.  Will continue to monitor for progression. Updated home exercise guidelines. Jhalil plans to continue to walk at home. eviewed THR, pulse, RPE, sign and symptoms, NTG use, and when to call 911 or MD.  Also discussed weather considerations and indoor options.  Pt voiced understanding.  Dr. Emily Filbert is Medical Director for [place] and [facility].

## 2019-06-14 DIAGNOSIS — H401132 Primary open-angle glaucoma, bilateral, moderate stage: Secondary | ICD-10-CM | POA: Diagnosis not present

## 2019-06-18 ENCOUNTER — Encounter: Payer: Medicare Other | Attending: Cardiovascular Disease

## 2019-06-18 ENCOUNTER — Other Ambulatory Visit: Payer: Self-pay

## 2019-06-18 DIAGNOSIS — Z87891 Personal history of nicotine dependence: Secondary | ICD-10-CM | POA: Diagnosis not present

## 2019-06-18 DIAGNOSIS — Z86711 Personal history of pulmonary embolism: Secondary | ICD-10-CM | POA: Insufficient documentation

## 2019-06-18 DIAGNOSIS — Z951 Presence of aortocoronary bypass graft: Secondary | ICD-10-CM | POA: Insufficient documentation

## 2019-06-18 DIAGNOSIS — Z79899 Other long term (current) drug therapy: Secondary | ICD-10-CM | POA: Insufficient documentation

## 2019-06-18 DIAGNOSIS — I10 Essential (primary) hypertension: Secondary | ICD-10-CM | POA: Insufficient documentation

## 2019-06-18 DIAGNOSIS — Z7982 Long term (current) use of aspirin: Secondary | ICD-10-CM | POA: Diagnosis not present

## 2019-06-18 DIAGNOSIS — E1151 Type 2 diabetes mellitus with diabetic peripheral angiopathy without gangrene: Secondary | ICD-10-CM | POA: Insufficient documentation

## 2019-06-18 DIAGNOSIS — I214 Non-ST elevation (NSTEMI) myocardial infarction: Secondary | ICD-10-CM | POA: Diagnosis not present

## 2019-06-18 DIAGNOSIS — Z7289 Other problems related to lifestyle: Secondary | ICD-10-CM | POA: Insufficient documentation

## 2019-06-18 DIAGNOSIS — Z7901 Long term (current) use of anticoagulants: Secondary | ICD-10-CM | POA: Insufficient documentation

## 2019-06-18 DIAGNOSIS — Z7902 Long term (current) use of antithrombotics/antiplatelets: Secondary | ICD-10-CM | POA: Insufficient documentation

## 2019-06-18 NOTE — Progress Notes (Signed)
Daily Session Note  Patient Details  Name: Damon English MRN: [id number] Date of Birth: 15-Mar-1951 Referring Provider:     Cardiac Rehab from 05/31/2019 in [facility] Cardiac and Pulmonary Rehab  Referring Provider  Cloretta Ned MD      Encounter Date: 06/18/2019  Check In:      Social History   Tobacco Use  Smoking Status Former Smoker  . Packs/day: 2.00  . Years: 40.00  . Pack years: 80.00  . Quit date: 11/27/2010  . Years since quitting: 8.5  Smokeless Tobacco Never Used  Tobacco Comment   Quit in 2012    Goals Met:  Independence with exercise equipment Exercise tolerated well No report of cardiac concerns or symptoms Strength training completed today  Goals Unmet:  Not Applicable  Comments: Pt able to follow exercise prescription today without complaint.  Will continue to monitor for progression.   Dr. Emily Filbert is Medical Director for [place] and [facility].

## 2019-06-20 ENCOUNTER — Encounter: Payer: Medicare Other | Admitting: *Deleted

## 2019-06-20 ENCOUNTER — Other Ambulatory Visit: Payer: Self-pay

## 2019-06-20 DIAGNOSIS — Z87891 Personal history of nicotine dependence: Secondary | ICD-10-CM | POA: Diagnosis not present

## 2019-06-20 DIAGNOSIS — E1151 Type 2 diabetes mellitus with diabetic peripheral angiopathy without gangrene: Secondary | ICD-10-CM | POA: Diagnosis not present

## 2019-06-20 DIAGNOSIS — I10 Essential (primary) hypertension: Secondary | ICD-10-CM | POA: Diagnosis not present

## 2019-06-20 DIAGNOSIS — I214 Non-ST elevation (NSTEMI) myocardial infarction: Secondary | ICD-10-CM | POA: Diagnosis not present

## 2019-06-20 DIAGNOSIS — Z7982 Long term (current) use of aspirin: Secondary | ICD-10-CM | POA: Diagnosis not present

## 2019-06-20 DIAGNOSIS — Z7289 Other problems related to lifestyle: Secondary | ICD-10-CM | POA: Diagnosis not present

## 2019-06-20 DIAGNOSIS — Z7902 Long term (current) use of antithrombotics/antiplatelets: Secondary | ICD-10-CM | POA: Diagnosis not present

## 2019-06-20 DIAGNOSIS — Z7901 Long term (current) use of anticoagulants: Secondary | ICD-10-CM | POA: Diagnosis not present

## 2019-06-20 DIAGNOSIS — Z951 Presence of aortocoronary bypass graft: Secondary | ICD-10-CM | POA: Diagnosis not present

## 2019-06-20 DIAGNOSIS — Z79899 Other long term (current) drug therapy: Secondary | ICD-10-CM | POA: Diagnosis not present

## 2019-06-20 DIAGNOSIS — Z86711 Personal history of pulmonary embolism: Secondary | ICD-10-CM | POA: Diagnosis not present

## 2019-06-20 NOTE — Progress Notes (Signed)
Daily Session Note  Patient Details  Name: RAMONE GANDER MRN: [id number] Date of Birth: [birth year] Referring Provider:     Cardiac Rehab from 05/31/2019 in [facility] Cardiac and Pulmonary Rehab  Referring Provider  Cloretta Ned MD      Encounter Date: 06/20/2019  Check In: Session Check In - 06/20/19 1203      Check-In   Supervising physician immediately available to respond to emergencies  See telemetry face sheet for immediately available ER MD    Location  [facility]-Cardiac & Pulmonary Rehab    Staff Present  Heath Lark, RN, BSN, CCRP;Jeanna Durrell BS, Exercise Physiologist;Amanda Oletta Darter, BA, ACSM CEP, Exercise Physiologist    Virtual Visit  No    Medication changes reported      No    Fall or balance concerns reported     No    Warm-up and Cool-down  Performed on first and last piece of equipment    Resistance Training Performed  Yes    VAD Patient?  No    PAD/SET Patient?  No      Pain Assessment   Currently in Pain?  No/denies          Social History   Tobacco Use  Smoking Status Former Smoker  . Packs/day: 2.00  . Years: 40.00  . Pack years: 80.00  . Quit date: 11/27/2010  . Years since quitting: 8.5  Smokeless Tobacco Never Used  Tobacco Comment   Quit in 2012    Goals Met:  Independence with exercise equipment Exercise tolerated well No report of cardiac concerns or symptoms Strength training completed today  Goals Unmet:  Not Applicable  Comments: Pt able to follow exercise prescription today without complaint.  Will continue to monitor for progression.    Dr. Emily Filbert is Medical Director for [place] and [facility].

## 2019-06-25 ENCOUNTER — Encounter: Payer: Medicare Other | Admitting: *Deleted

## 2019-06-25 ENCOUNTER — Other Ambulatory Visit: Payer: Self-pay

## 2019-06-25 DIAGNOSIS — Z86711 Personal history of pulmonary embolism: Secondary | ICD-10-CM | POA: Diagnosis not present

## 2019-06-25 DIAGNOSIS — Z951 Presence of aortocoronary bypass graft: Secondary | ICD-10-CM | POA: Diagnosis not present

## 2019-06-25 DIAGNOSIS — Z7902 Long term (current) use of antithrombotics/antiplatelets: Secondary | ICD-10-CM | POA: Diagnosis not present

## 2019-06-25 DIAGNOSIS — Z7289 Other problems related to lifestyle: Secondary | ICD-10-CM | POA: Diagnosis not present

## 2019-06-25 DIAGNOSIS — E1151 Type 2 diabetes mellitus with diabetic peripheral angiopathy without gangrene: Secondary | ICD-10-CM | POA: Diagnosis not present

## 2019-06-25 DIAGNOSIS — Z7982 Long term (current) use of aspirin: Secondary | ICD-10-CM | POA: Diagnosis not present

## 2019-06-25 DIAGNOSIS — I214 Non-ST elevation (NSTEMI) myocardial infarction: Secondary | ICD-10-CM

## 2019-06-25 DIAGNOSIS — Z87891 Personal history of nicotine dependence: Secondary | ICD-10-CM | POA: Diagnosis not present

## 2019-06-25 DIAGNOSIS — Z79899 Other long term (current) drug therapy: Secondary | ICD-10-CM | POA: Diagnosis not present

## 2019-06-25 DIAGNOSIS — I10 Essential (primary) hypertension: Secondary | ICD-10-CM | POA: Diagnosis not present

## 2019-06-25 DIAGNOSIS — Z7901 Long term (current) use of anticoagulants: Secondary | ICD-10-CM | POA: Diagnosis not present

## 2019-06-25 NOTE — Progress Notes (Signed)
Daily Session Note  Patient Details  Name: LOYD MARHEFKA MRN: [id number] Date of Birth: 03/25/1951 Referring Provider:     Cardiac Rehab from 05/31/2019 in [facility] Cardiac and Pulmonary Rehab  Referring Provider  Cloretta Ned MD      Encounter Date: 06/25/2019  Check In: Session Check In - 06/25/19 1055      Check-In   Supervising physician immediately available to respond to emergencies  See telemetry face sheet for immediately available ER MD    Location  [facility]-Cardiac & Pulmonary Rehab    Staff Present  Heath Lark, RN, BSN, CCRP;Amanda Sommer, BA, ACSM CEP, Exercise Physiologist;Joseph Hood RCP,RRT,BSRT    Virtual Visit  No    Medication changes reported      No    Fall or balance concerns reported     No    Warm-up and Cool-down  Performed on first and last piece of equipment    Resistance Training Performed  Yes    VAD Patient?  No    PAD/SET Patient?  No      Pain Assessment   Currently in Pain?  No/denies          Social History   Tobacco Use  Smoking Status Former Smoker  . Packs/day: 2.00  . Years: 40.00  . Pack years: 80.00  . Quit date: 11/27/2010  . Years since quitting: 8.5  Smokeless Tobacco Never Used  Tobacco Comment   Quit in 2012    Goals Met:  Independence with exercise equipment Exercise tolerated well No report of cardiac concerns or symptoms Strength training completed today  Goals Unmet:  Not Applicable  Comments: Pt able to follow exercise prescription today without complaint.  Will continue to monitor for progression.    Dr. Emily Filbert is Medical Director for [place] and [facility].

## 2019-06-26 ENCOUNTER — Encounter: Payer: Self-pay | Admitting: *Deleted

## 2019-06-26 DIAGNOSIS — I214 Non-ST elevation (NSTEMI) myocardial infarction: Secondary | ICD-10-CM

## 2019-06-26 DIAGNOSIS — Z951 Presence of aortocoronary bypass graft: Secondary | ICD-10-CM

## 2019-06-26 NOTE — Progress Notes (Signed)
Cardiac Individual Treatment Plan  Patient Details  Name: Damon English MRN: [id number] Date of Birth: [birth year] Referring Provider:     Cardiac Rehab from 05/31/2019 in [facility] Cardiac and Pulmonary Rehab  Referring Provider  Cloretta Ned MD      Initial Encounter Date:    Cardiac Rehab from 05/31/2019 in [facility] Cardiac and Pulmonary Rehab  Date  05/31/19      Visit Diagnosis: NSTEMI (non-ST elevation myocardial infarction) ([place])  S/P CABG x 3  Patient's Home Medications on Admission:  Current Outpatient Medications:  .  apixaban (ELIQUIS) 5 MG TABS tablet, Take by mouth., Disp: , Rfl:  .  aspirin 81 MG chewable tablet, Chew by mouth., Disp: , Rfl:  .  atorvastatin (LIPITOR) 80 MG tablet, Take by mouth., Disp: , Rfl:  .  clonazePAM (KLONOPIN) 0.5 MG tablet, Take 0.25-0.5 mg by mouth 3 (three) times daily as needed for anxiety. , Disp: , Rfl:  .  clopidogrel (PLAVIX) 75 MG tablet, Take 75 mg by mouth daily., Disp: , Rfl:  .  DULoxetine (CYMBALTA) 60 MG capsule, Take 1 capsule (60 mg total) by mouth 2 (two) times daily. (Patient not taking: Reported on 03/27/2019), Disp: 20 capsule, Rfl: 0 .  esomeprazole (NEXIUM) 40 MG capsule, Take 1 capsule by mouth daily., Disp: , Rfl:  .  famotidine (PEPCID) 40 MG tablet, Take 1 tablet by mouth every morning., Disp: , Rfl:  .  HYDROcodone-acetaminophen (NORCO/VICODIN) 5-325 MG tablet, Take 0.5-1 tablets by mouth 2 (two) times daily as needed for pain., Disp: , Rfl:  .  Latanoprost 0.005 % EMUL, Apply to eye., Disp: , Rfl:  .  Magnesium (V-R MAGNESIUM) 250 MG TABS, Take by mouth., Disp: , Rfl:  .  metoprolol succinate (TOPROL-XL) 50 MG 24 hr tablet, Take 50 mg by mouth 2 (two) times a day. Take with or immediately following a meal., Disp: , Rfl:  .  ramipril (ALTACE) 2.5 MG capsule, Take by mouth., Disp: , Rfl:  .  ranolazine (RANEXA) 500 MG 12 hr tablet, Take 500 mg by mouth 2 (two) times daily., Disp: , Rfl:  .  SENNOSIDES PO, Take by mouth.,  Disp: , Rfl:  .  spironolactone (ALDACTONE) 25 MG tablet, Take 25 mg by mouth., Disp: , Rfl:  .  sucralfate (CARAFATE) 1 g tablet, Take 1 tablet by mouth 4 (four) times daily -  before meals and at bedtime., Disp: , Rfl:   Past Medical History: Past Medical History:  Diagnosis Date  . Alcohol use    "cutting back" but still heavy and daily, multiple shots of bourbon each night  . Diabetes mellitus without complication ([place])   . History of tobacco use    reportedly quit in 2013  . Hypertension   . Pulmonary embolism ([place]) 2012   unprovoked  . PVD (peripheral vascular disease) ([place])     Tobacco Use: Social History   Tobacco Use  Smoking Status Former Smoker  . Packs/day: 2.00  . Years: 40.00  . Pack years: 80.00  . Quit date: 11/27/2010  . Years since quitting: 8.5  Smokeless Tobacco Never Used  Tobacco Comment   Quit in 2012    Labs: Recent Review Flowsheet Data    Labs for ITP Cardiac and Pulmonary Rehab Latest Ref Rng & Units 01/28/2019 01/29/2019   Cholestrol 0 - 200 mg/dL 168 143   LDLCALC 0 - 99 mg/dL 113(H) 93   HDL >40 mg/dL 36(L) 33(L)   Trlycerides <150 mg/dL 97  87       Exercise Target Goals: Exercise Program Goal: Individual exercise prescription set using results from initial 6 min walk test and THRR while considering  patient's activity barriers and safety.   Exercise Prescription Goal: Initial exercise prescription builds to 30-45 minutes a day of aerobic activity, 2-3 days per week.  Home exercise guidelines will be given to patient during program as part of exercise prescription that the participant will acknowledge.  Activity Barriers & Risk Stratification: Activity Barriers & Cardiac Risk Stratification - 05/31/19 1238      Activity Barriers & Cardiac Risk Stratification   Activity Barriers  Muscular Weakness;Deconditioning;Back Problems;Other (comment);Chest Pain/Angina;Shortness of Breath;Assistive Device;Balance Concerns;Decreased Ventricular  Function    Comments  R leg numb/pain post-op, Hx back surgery, PAD, r foot numb, uses walker on occasion    Cardiac Risk Stratification  High       6 Minute Walk: 6 Minute Walk    Row Name 05/31/19 1234         6 Minute Walk   Phase  Initial     Distance  370 feet     Walk Time  3.97 minutes     # of Rest Breaks  4 15 sec, 15 sec, 20 sec, stopped at 4:48     MPH  1.06     METS  1.91     RPE  13     Perceived Dyspnea   1     VO2 Peak  6.67     Symptoms  Yes (comment)     Comments  fatigue, SOB, R hip/back support, chest pain 5/10     Resting HR  100 bpm     Resting BP  142/70     Resting Oxygen Saturation   98 %     Exercise Oxygen Saturation  during 6 min walk  96 %     Max Ex. HR  134 bpm     Max Ex. BP  152/74     2 Minute Post BP  146/64        Oxygen Initial Assessment:   Oxygen Re-Evaluation:   Oxygen Discharge (Final Oxygen Re-Evaluation):   Initial Exercise Prescription: Initial Exercise Prescription - 05/31/19 1200      Date of Initial Exercise RX and Referring Provider   Date  05/31/19    Referring Provider  Cloretta Ned MD      Treadmill   MPH  0.8    Grade  0    Minutes  15    METs  1.6      NuStep   Level  1    SPM  80    Minutes  15    METs  1.5      REL-XR   Level  1    Speed  50    Minutes  15    METs  1.5      T5 Nustep   Level  1    SPM  80    Minutes  15    METs  1.5      Prescription Details   Frequency (times per week)  2    Duration  Progress to 30 minutes of continuous aerobic without signs/symptoms of physical distress      Intensity   THRR 40-80% of Max Heartrate  122-143    Ratings of Perceived Exertion  11-13    Perceived Dyspnea  0-4      Progression   Progression  Continue to progress workloads to maintain intensity without signs/symptoms of physical distress.      Resistance Training   Training Prescription  Yes    Weight  3 lbs    Reps  10-15       Perform Capillary Blood Glucose checks as  needed.  Exercise Prescription Changes: Exercise Prescription Changes    Row Name 05/31/19 1200 06/07/19 0800 06/11/19 1500         Response to Exercise   Blood Pressure (Admit)  142/70  124/58  148/70     Blood Pressure (Exercise)  152/74  148/74  150/80     Blood Pressure (Exit)  146/64  120/80  140/80     Heart Rate (Admit)  100 bpm  66 bpm  69 bpm     Heart Rate (Exercise)  134 bpm  93 bpm  98 bpm     Heart Rate (Exit)  93 bpm  64 bpm  68 bpm     Oxygen Saturation (Admit)  98 %  -  -     Oxygen Saturation (Exercise)  96 %  -  -     Rating of Perceived Exertion (Exercise)  _0 Perceived Dyspnea (Exercise)  1  -  -     Symptoms  fatigue, SOB, chest pain  -  -     Comments  walk test results  -  -     Duration  -  Progress to 30 minutes of  aerobic without signs/symptoms of physical distress  Progress to 30 minutes of  aerobic without signs/symptoms of physical distress     Intensity  -  THRR unchanged  THRR unchanged       Progression   Progression  -  Continue to progress workloads to maintain intensity without signs/symptoms of physical distress.  Continue to progress workloads to maintain intensity without signs/symptoms of physical distress.     Average METs  -  1.9  1.8       Resistance Training   Training Prescription  -  Yes  Yes     Weight  -  2 lb per session detail sheet  3 lb     Reps  -  10-15  10-15       Interval Training   Interval Training  -  No  No       NuStep   Level  -  1  -     SPM  -  80  -     Minutes  -  15  -     METs  -  1.9  -        Exercise Comments: Exercise Comments    Row Name 06/06/19 1137           Exercise Comments  Continues to experience angina symptoms. Has been able to stop, rest and symptoms resolve.          Exercise Goals and Review: Exercise Goals    Row Name 05/31/19 1241             Exercise Goals   Increase Physical Activity  Yes       Intervention  Provide advice, education, support and  counseling about physical activity/exercise needs.;Develop an individualized exercise prescription for aerobic and resistive training based on initial evaluation findings, risk stratification, comorbidities and participant's personal goals.       Expected Outcomes  Short Term: Attend rehab on  a regular basis to increase amount of physical activity.;Long Term: Add in home exercise to make exercise part of routine and to increase amount of physical activity.;Long Term: Exercising regularly at least 3-5 days a week.       Increase Strength and Stamina  Yes       Intervention  Provide advice, education, support and counseling about physical activity/exercise needs.;Develop an individualized exercise prescription for aerobic and resistive training based on initial evaluation findings, risk stratification, comorbidities and participant's personal goals.       Expected Outcomes  Short Term: Increase workloads from initial exercise prescription for resistance, speed, and METs.;Short Term: Perform resistance training exercises routinely during rehab and add in resistance training at home;Long Term: Improve cardiorespiratory fitness, muscular endurance and strength as measured by increased METs and functional capacity (6MWT)       Able to understand and use rate of perceived exertion (RPE) scale  Yes       Intervention  Provide education and explanation on how to use RPE scale       Expected Outcomes  Short Term: Able to use RPE daily in rehab to express subjective intensity level;Long Term:  Able to use RPE to guide intensity level when exercising independently       Able to understand and use Dyspnea scale  Yes       Intervention  Provide education and explanation on how to use Dyspnea scale       Expected Outcomes  Short Term: Able to use Dyspnea scale daily in rehab to express subjective sense of shortness of breath during exertion;Long Term: Able to use Dyspnea scale to guide intensity level when exercising  independently       Knowledge and understanding of Target Heart Rate Range (THRR)  Yes       Intervention  Provide education and explanation of THRR including how the numbers were predicted and where they are located for reference       Expected Outcomes  Short Term: Able to state/look up THRR;Short Term: Able to use daily as guideline for intensity in rehab;Long Term: Able to use THRR to govern intensity when exercising independently       Able to check pulse independently  Yes       Intervention  Provide education and demonstration on how to check pulse in carotid and radial arteries.;Review the importance of being able to check your own pulse for safety during independent exercise       Expected Outcomes  Short Term: Able to explain why pulse checking is important during independent exercise;Long Term: Able to check pulse independently and accurately       Understanding of Exercise Prescription  Yes       Intervention  Provide education, explanation, and written materials on patient's individual exercise prescription       Expected Outcomes  Short Term: Able to explain program exercise prescription;Long Term: Able to explain home exercise prescription to exercise independently          Exercise Goals Re-Evaluation : Exercise Goals Re-Evaluation    Row Name 04/30/19 1340 06/11/19 1549 06/13/19 1107         Exercise Goal Re-Evaluation   Exercise Goals Review  Increase Physical Activity;Increase Strength and Stamina;Understanding of Exercise Prescription  Increase Physical Activity;Increase Strength and Stamina;Able to understand and use rate of perceived exertion (RPE) scale;Knowledge and understanding of Target Heart Rate Range (THRR);Understanding of Exercise Prescription  Increase Physical Activity;Increase Strength and Stamina;Understanding of Exercise  Prescription     Comments  Adien has not been able to exercise per doctor's orders due to ongoing angina.  We will continue to follow  progression.  Alekai has done well since starting back to HT.  Staff will monitor progress  Derald is off to a good start in rehab.  Updated home exercise guidelines. Bunnie plans to continue to walk at home. eviewed THR, pulse, RPE, sign and symptoms, NTG use, and when to call 911 or MD.  Also discussed weather considerations and indoor options.  Pt voiced understanding.     Expected Outcomes  Short: Get clearance to exercise.  Long: Cleared to start rehab.  Short : exercise consistently Long - increase MET level  Short: Start to add exercise in at home.  Long: Continue to attend regularly.        Discharge Exercise Prescription (Final Exercise Prescription Changes): Exercise Prescription Changes - 06/11/19 1500      Response to Exercise   Blood Pressure (Admit)  148/70    Blood Pressure (Exercise)  150/80    Blood Pressure (Exit)  140/80    Heart Rate (Admit)  69 bpm    Heart Rate (Exercise)  98 bpm    Heart Rate (Exit)  68 bpm    Rating of Perceived Exertion (Exercise)  13    Duration  Progress to 30 minutes of  aerobic without signs/symptoms of physical distress    Intensity  THRR unchanged      Progression   Progression  Continue to progress workloads to maintain intensity without signs/symptoms of physical distress.    Average METs  1.8      Resistance Training   Training Prescription  Yes    Weight  3 lb    Reps  10-15      Interval Training   Interval Training  No       Nutrition:  Target Goals: Understanding of nutrition guidelines, daily intake of sodium <1574m, cholesterol <2055m calories 30% from fat and 7% or less from saturated fats, daily to have 5 or more servings of fruits and vegetables.  Biometrics: Pre Biometrics - 05/31/19 1241      Pre Biometrics   Height  5' 6.8" (1.697 m)    Weight  188 lb 8 oz (85.5 kg)    BMI (Calculated)  29.69    Single Leg Stand  1.44 seconds        Nutrition Therapy Plan and Nutrition Goals: Nutrition Therapy & Goals -  04/15/19 1334      Nutrition Therapy   Diet  HH, low Na diet    Protein (specify units)  75g    Fiber  25 grams    Whole Grain Foods  3 servings    Saturated Fats  12 max. grams    Fruits and Vegetables  5 servings/day    Sodium  1.5 grams      Personal Nutrition Goals   Nutrition Goal  ST: switch out 1 cup of lemonade for 1 cup of >/=75% water & </=25% lemonade LT: maintain wt, keep HgA1c down    Comments  1.5-2 y ago 240lbs, now 185lbs (self reported); 206lbs (per chart). HgA1c: 6.1-6.5. T2DM controlled with diet (cut CHOs) B: Non-fat creamer & splenda; lost appetite for coffee in the hospital, now drinks decaf 2cups. B: fruits and/or cereal (cheerios) with fruit and almond milk. L: low CHO beer, (1/2 [place] w/ a bit of lite mayo and lettuce and tomato) and whole wheat  pasta with zoodles and sauce. 2-3x/week will have hamburger (no cheese) on whole wheat bread or hamburger steak with muschrooms and onions. Pt reports watching sodium, limited fried food (every now and again will have fried chicken), lemonade (no cal, no sugar - splenda instead). D: vegetable soup (beans and lots of leafy greens). Will eat poultry and shrimp and scallops.      Intervention Plan   Intervention  Prescribe, educate and counsel regarding individualized specific dietary modifications aiming towards targeted core components such as weight, hypertension, lipid management, diabetes, heart failure and other comorbidities.;Nutrition handout(s) given to patient.   emailed pt handout   Expected Outcomes  Short Term Goal: Understand basic principles of dietary content, such as calories, fat, sodium, cholesterol and nutrients.;Short Term Goal: A plan has been developed with personal nutrition goals set during dietitian appointment.;Long Term Goal: Adherence to prescribed nutrition plan.       Nutrition Assessments: Nutrition Assessments - 05/29/19 1445      MEDFICTS Scores   Pre Score  12       Nutrition  Goals Re-Evaluation: Nutrition Goals Re-Evaluation    Row Name 06/11/19 1111             Goals   Nutrition Goal  ST:  B can add soymilk instead of almondmilk or add hempheart or ground flax LT: maintain wt, keep HgA1c down       Comment  B: Non-fat creamer & splenda; lost appetite for coffee in the hospital, now drinks decaf 2cups. B: fruits and/or cereal (cheerios) with fruit and almond milk. L: low CHO beer, (1/2 [place] sandwich w/ a bit of lite mayo and lettuce and tomato) and whole wheat pasta with zoodles and sauce. 2-3x/week will have hamburger (no cheese) on whole wheat bread or hamburger steak with muschrooms and onions. Pt reports watching sodium, limited fried food (every now and again will have fried chicken), lemonade (no cal, no sugar - splenda instead). D: vegetable soup (beans and lots of leafy greens). Will eat poultry and shrimp and scallops. Pt mixing lemonade with water. Pt reports not eating as much as usual due to low appetite from surgery. Disucssed for B can add soymilk instead of almondmilk or add hempheart or ground flax for a nutrition boost (teeth are not great so doesn't eat nuts).       Expected Outcome  ST:  B can add soymilk instead of almondmilk or add hempheart or ground flax LT: maintain wt, keep HgA1c down          Nutrition Goals Discharge (Final Nutrition Goals Re-Evaluation): Nutrition Goals Re-Evaluation - 06/11/19 1111      Goals   Nutrition Goal  ST:  B can add soymilk instead of almondmilk or add hempheart or ground flax LT: maintain wt, keep HgA1c down    Comment  B: Non-fat creamer & splenda; lost appetite for coffee in the hospital, now drinks decaf 2cups. B: fruits and/or cereal (cheerios) with fruit and almond milk. L: low CHO beer, (1/2 [place] sandwich w/ a bit of lite mayo and lettuce and tomato) and whole wheat pasta with zoodles and sauce. 2-3x/week will have hamburger (no cheese) on whole wheat bread or hamburger steak with muschrooms and  onions. Pt reports watching sodium, limited fried food (every now and again will have fried chicken), lemonade (no cal, no sugar - splenda instead). D: vegetable soup (beans and lots of leafy greens). Will eat poultry and shrimp and scallops. Pt mixing lemonade with water. Pt  reports not eating as much as usual due to low appetite from surgery. Disucssed for B can add soymilk instead of almondmilk or add hempheart or ground flax for a nutrition boost (teeth are not great so doesn't eat nuts).    Expected Outcome  ST:  B can add soymilk instead of almondmilk or add hempheart or ground flax LT: maintain wt, keep HgA1c down       Psychosocial: Target Goals: Acknowledge presence or absence of significant depression and/or stress, maximize coping skills, provide positive support system. Participant is able to verbalize types and ability to use techniques and skills needed for reducing stress and depression.   Initial Review & Psychosocial Screening: Initial Psych Review & Screening - 05/28/19 1343      Initial Review   Current issues with  None Identified   Is taking Klonopin after several hospitalizations.  Med prescribed after all this and he feels he is doing good     Family Dynamics   Good Support System?  Yes   Wife     Barriers   Psychosocial barriers to participate in program  There are no identifiable barriers or psychosocial needs.;The patient should benefit from training in stress management and relaxation.      Screening Interventions   Interventions  Encouraged to exercise;Provide feedback about the scores to participant;To provide support and resources with identified psychosocial needs    Expected Outcomes  Short Term goal: Utilizing psychosocial counselor, staff and physician to assist with identification of specific Stressors or current issues interfering with healing process. Setting desired goal for each stressor or current issue identified.;Long Term Goal: Stressors or current  issues are controlled or eliminated.;Short Term goal: Identification and review with participant of any Quality of Life or Depression concerns found by scoring the questionnaire.;Long Term goal: The participant improves quality of Life and PHQ9 Scores as seen by post scores and/or verbalization of changes       Quality of Life Scores:  Quality of Life - 05/29/19 1442      Quality of Life   Select  Quality of Life      Quality of Life Scores   Health/Function Pre  8.6 %    Socioeconomic Pre  26.43 %    Psych/Spiritual Pre  15.57 %    Family Pre  25.2 %    GLOBAL Pre  16.44 %      Scores of 19 and below usually indicate a poorer quality of life in these areas.  A difference of  2-3 points is a clinically meaningful difference.  A difference of 2-3 points in the total score of the Quality of Life Index has been associated with significant improvement in overall quality of life, self-image, physical symptoms, and general health in studies assessing change in quality of life.  PHQ-9: Recent Review Flowsheet Data    Depression screen [facility] 2/9 05/31/2019   Decreased Interest 0   Down, Depressed, Hopeless 1   PHQ - 2 Score 1   Altered sleeping 3   Tired, decreased energy 3   Change in appetite 0   Feeling bad or failure about yourself  0   Trouble concentrating 0   Moving slowly or fidgety/restless 0   Suicidal thoughts 0   PHQ-9 Score 7   Difficult doing work/chores Somewhat difficult     Interpretation of Total Score  Total Score Depression Severity:  1-4 = Minimal depression, 5-9 = Mild depression, 10-14 = Moderate depression, 15-19 = Moderately severe depression,  20-27 = Severe depression   Psychosocial Evaluation and Intervention:   Psychosocial Re-Evaluation: Psychosocial Re-Evaluation    [place] Name 06/13/19 1109             Psychosocial Re-Evaluation   Current issues with  Current Stress Concerns       Comments  Sho is doing well in rehab.  He is off to a good start.   He feels good mentally and tries not to let too many things get to him.  His health is his biggest issue.  He sleep pretty well.       Expected Outcomes  Short: Exericse for boost.  Long: Continue to stay positive.       Interventions  Encouraged to attend Cardiac Rehabilitation for the exercise       Continue Psychosocial Services   Follow up required by staff          Psychosocial Discharge (Final Psychosocial Re-Evaluation): Psychosocial Re-Evaluation - 06/13/19 1109      Psychosocial Re-Evaluation   Current issues with  Current Stress Concerns    Comments  Amr is doing well in rehab.  He is off to a good start.  He feels good mentally and tries not to let too many things get to him.  His health is his biggest issue.  He sleep pretty well.    Expected Outcomes  Short: Exericse for boost.  Long: Continue to stay positive.    Interventions  Encouraged to attend Cardiac Rehabilitation for the exercise    Continue Psychosocial Services   Follow up required by staff       Vocational Rehabilitation: Provide vocational rehab assistance to qualifying candidates.   Vocational Rehab Evaluation & Intervention: Vocational Rehab - 05/28/19 1348      Initial Vocational Rehab Evaluation & Intervention   Assessment shows need for Vocational Rehabilitation  No       Education: Education Goals: Education classes will be provided on a variety of topics geared toward better understanding of heart health and risk factor modification. Participant will state understanding/return demonstration of topics presented as noted by education test scores.  Learning Barriers/Preferences:   Education Topics:  AED/CPR: - Group verbal and written instruction with the use of models to demonstrate the basic use of the AED with the basic ABC's of resuscitation.   General Nutrition Guidelines/Fats and Fiber: -Group instruction provided by verbal, written material, models and posters to present the general  guidelines for heart healthy nutrition. Gives an explanation and review of dietary fats and fiber.   Controlling Sodium/Reading Food Labels: -Group verbal and written material supporting the discussion of sodium use in heart healthy nutrition. Review and explanation with models, verbal and written materials for utilization of the food label.   Exercise Physiology & General Exercise Guidelines: - Group verbal and written instruction with models to review the exercise physiology of the cardiovascular system and associated critical values. Provides general exercise guidelines with specific guidelines to those with heart or lung disease.    Aerobic Exercise & Resistance Training: - Gives group verbal and written instruction on the various components of exercise. Focuses on aerobic and resistive training programs and the benefits of this training and how to safely progress through these programs..   Flexibility, Balance, Mind/Body Relaxation: Provides group verbal/written instruction on the benefits of flexibility and balance training, including mind/body exercise modes such as yoga, pilates and tai chi.  Demonstration and skill practice provided.   Stress and Anxiety: - Provides group verbal  and written instruction about the health risks of elevated stress and causes of high stress.  Discuss the correlation between heart/lung disease and anxiety and treatment options. Review healthy ways to manage with stress and anxiety.   Depression: - Provides group verbal and written instruction on the correlation between heart/lung disease and depressed mood, treatment options, and the stigmas associated with seeking treatment.   Anatomy & Physiology of the Heart: - Group verbal and written instruction and models provide basic cardiac anatomy and physiology, with the coronary electrical and arterial systems. Review of Valvular disease and Heart Failure   Cardiac Procedures: - Group verbal and written  instruction to review commonly prescribed medications for heart disease. Reviews the medication, class of the drug, and side effects. Includes the steps to properly store meds and maintain the prescription regimen. (beta blockers and nitrates)   Cardiac Medications I: - Group verbal and written instruction to review commonly prescribed medications for heart disease. Reviews the medication, class of the drug, and side effects. Includes the steps to properly store meds and maintain the prescription regimen.   Cardiac Medications II: -Group verbal and written instruction to review commonly prescribed medications for heart disease. Reviews the medication, class of the drug, and side effects. (all other drug classes)    Go Sex-Intimacy & Heart Disease, Get SMART - Goal Setting: - Group verbal and written instruction through game format to discuss heart disease and the return to sexual intimacy. Provides group verbal and written material to discuss and apply goal setting through the application of the S.M.A.R.T. Method.   Other Matters of the Heart: - Provides group verbal, written materials and models to describe Stable Angina and Peripheral Artery. Includes description of the disease process and treatment options available to the cardiac patient.   Exercise & Equipment Safety: - Individual verbal instruction and demonstration of equipment use and safety with use of the equipment.   Cardiac Rehab from 05/31/2019 in [facility] Cardiac and Pulmonary Rehab  Date  05/31/19  Educator  [facility]  Instruction Review Code  1- Verbalizes Understanding      Infection Prevention: - Provides verbal and written material to individual with discussion of infection control including proper hand washing and proper equipment cleaning during exercise session.   Cardiac Rehab from 05/31/2019 in [facility] Cardiac and Pulmonary Rehab  Date  05/31/19  Educator  [facility]  Instruction Review Code  1- Verbalizes Understanding       Falls Prevention: - Provides verbal and written material to individual with discussion of falls prevention and safety.   Cardiac Rehab from 05/31/2019 in [facility] Cardiac and Pulmonary Rehab  Date  05/31/19  Educator  [facility]  Instruction Review Code  1- Verbalizes Understanding      Diabetes: - Individual verbal and written instruction to review signs/symptoms of diabetes, desired ranges of glucose level fasting, after meals and with exercise. Acknowledge that pre and post exercise glucose checks will be done for 3 sessions at entry of program.   Know Your Numbers and Risk Factors: -Group verbal and written instruction about important numbers in your health.  Discussion of what are risk factors and how they play a role in the disease process.  Review of Cholesterol, Blood Pressure, Diabetes, and BMI and the role they play in your overall health.   Sleep Hygiene: -Provides group verbal and written instruction about how sleep can affect your health.  Define sleep hygiene, discuss sleep cycles and impact of sleep habits. Review good sleep hygiene tips.  Other: -Provides group and verbal instruction on various topics (see comments)   Knowledge Questionnaire Score: Knowledge Questionnaire Score - 05/29/19 1445      Knowledge Questionnaire Score   Pre Score  23/26   Education Focus: Nutrition and Exercise      Core Components/Risk Factors/Patient Goals at Admission: Personal Goals and Risk Factors at Admission - 05/31/19 1242      Core Components/Risk Factors/Patient Goals on Admission    Weight Management  Yes;Weight Loss    Intervention  Weight Management: Develop a combined nutrition and exercise program designed to reach desired caloric intake, while maintaining appropriate intake of nutrient and fiber, sodium and fats, and appropriate energy expenditure required for the weight goal.;Weight Management: Provide education and appropriate resources to help participant work on and  attain dietary goals.    Admit Weight  188 lb 8 oz (85.5 kg)    Goal Weight: Short Term  183 lb (83 kg)    Goal Weight: Long Term  180 lb (81.6 kg)    Expected Outcomes  Short Term: Continue to assess and modify interventions until short term weight is achieved;Long Term: Adherence to nutrition and physical activity/exercise program aimed toward attainment of established weight goal;Weight Loss: Understanding of general recommendations for a balanced deficit meal plan, which promotes 1-2 lb weight loss per week and includes a negative energy balance of [phone] kcal/d;Understanding recommendations for meals to include 15-35% energy as protein, 25-35% energy from fat, 35-60% energy from carbohydrates, less than 231m of dietary cholesterol, 20-35 gm of total fiber daily;Understanding of distribution of calorie intake throughout the day with the consumption of 4-5 meals/snacks    Diabetes  Yes    Intervention  Provide education about signs/symptoms and action to take for hypo/hyperglycemia.;Provide education about proper nutrition, including hydration, and aerobic/resistive exercise prescription along with prescribed medications to achieve blood glucose in normal ranges: Fasting glucose 65-99 mg/dL    Expected Outcomes  Short Term: Participant verbalizes understanding of the signs/symptoms and immediate care of hyper/hypoglycemia, proper foot care and importance of medication, aerobic/resistive exercise and nutrition plan for blood glucose control.;Long Term: Attainment of HbA1C < 7%.    Hypertension  Yes    Intervention  Provide education on lifestyle modifcations including regular physical activity/exercise, weight management, moderate sodium restriction and increased consumption of fresh fruit, vegetables, and low fat dairy, alcohol moderation, and smoking cessation.;Monitor prescription use compliance.    Expected Outcomes  Short Term: Continued assessment and intervention until BP is < 140/924mHG in  hypertensive participants. < 130/8064mG in hypertensive participants with diabetes, heart failure or chronic kidney disease.;Long Term: Maintenance of blood pressure at goal levels.    Lipids  Yes    Intervention  Provide education and support for participant on nutrition & aerobic/resistive exercise along with prescribed medications to achieve LDL <22m38mDL >40mg76m Expected Outcomes  Short Term: Participant states understanding of desired cholesterol values and is compliant with medications prescribed. Participant is following exercise prescription and nutrition guidelines.;Long Term: Cholesterol controlled with medications as prescribed, with individualized exercise RX and with personalized nutrition plan. Value goals: LDL < 22mg,67m > 40 mg.       Core Components/Risk Factors/Patient Goals Review:  Goals and Risk Factor Review    Row Name 06/13/19 1110             Core Components/Risk Factors/Patient Goals Review   Personal Goals Review  Weight Management/Obesity;Diabetes;Hypertension       Review  GaryDaryl  is doing well in rehab. His weight is staying steady thanks to a good diet. He has gotten away from fried foods altogether and doesn't miss them.  His blood sugars have gotten better with the improved diet. He checks them about once a week at home and more if it feels high.  Pressures have been good generally.  He had a high day on Tuesday, but much better yesterday.  He had [place] on leg yesterday and talked to doctor about it and flow is at 105%.  Konnor still has chest pain with exercise but it is improving.       Expected Outcomes  Short; Continue to work on weight loss.  Long: Continue to monitor risk factors.          Core Components/Risk Factors/Patient Goals at Discharge (Final Review):  Goals and Risk Factor Review - 06/13/19 1110      Core Components/Risk Factors/Patient Goals Review   Personal Goals Review  Weight Management/Obesity;Diabetes;Hypertension    Review  Lamonta is  doing well in rehab. His weight is staying steady thanks to a good diet. He has gotten away from fried foods altogether and doesn't miss them.  His blood sugars have gotten better with the improved diet. He checks them about once a week at home and more if it feels high.  Pressures have been good generally.  He had a high day on Tuesday, but much better yesterday.  He had [place] on leg yesterday and talked to doctor about it and flow is at 105%.  Jansel still has chest pain with exercise but it is improving.    Expected Outcomes  Short; Continue to work on weight loss.  Long: Continue to monitor risk factors.       ITP Comments: ITP Comments    Row Name 03/27/19 1100 04/15/19 1304 04/30/19 1335 05/27/19 1336 05/28/19 1400   ITP Comments  Initial Consent and Intake completed for entry into the Virtual Home Based Program. Will also use [employer] App. Appts made for EP and RD.   Completed initial ExRx created and sent to Dr. Emily Filbert, Medical Director to review and sign.  Completed Virtual Visit.  Zerick is still having chest pain and BP issues.  He has a follow up appt tomorrow and will call afterwards with an update.  See progress notes for 04/30/19.  Khiyan called and has been cleared to start rehab. He is down to 3-4 NTG a day versus 8-9.  His doctor would like for him to go ahead and get started with the program.  Virtual Orientation to program completed today.  Documentation of diagnosis can be found in [facility] 4/15   Row Name 05/31/19 1222 06/06/19 1137 06/26/19 0545       ITP Comments  Completed 6MWT and gym orientation. Initial ITP created and sent for review to Dr. Emily Filbert, Medical Director.  Continues to experience angina symptoms. Has been able to stop, rest and symptoms resolve.  30 Day review. Continue with ITP unless directed changes per Medical Director review.        Comments:

## 2019-06-27 ENCOUNTER — Encounter: Payer: Medicare Other | Admitting: *Deleted

## 2019-06-27 ENCOUNTER — Other Ambulatory Visit: Payer: Self-pay

## 2019-06-27 DIAGNOSIS — E1151 Type 2 diabetes mellitus with diabetic peripheral angiopathy without gangrene: Secondary | ICD-10-CM | POA: Diagnosis not present

## 2019-06-27 DIAGNOSIS — Z7902 Long term (current) use of antithrombotics/antiplatelets: Secondary | ICD-10-CM | POA: Diagnosis not present

## 2019-06-27 DIAGNOSIS — Z86711 Personal history of pulmonary embolism: Secondary | ICD-10-CM | POA: Diagnosis not present

## 2019-06-27 DIAGNOSIS — Z7289 Other problems related to lifestyle: Secondary | ICD-10-CM | POA: Diagnosis not present

## 2019-06-27 DIAGNOSIS — Z7901 Long term (current) use of anticoagulants: Secondary | ICD-10-CM | POA: Diagnosis not present

## 2019-06-27 DIAGNOSIS — I214 Non-ST elevation (NSTEMI) myocardial infarction: Secondary | ICD-10-CM | POA: Diagnosis not present

## 2019-06-27 DIAGNOSIS — Z951 Presence of aortocoronary bypass graft: Secondary | ICD-10-CM | POA: Diagnosis not present

## 2019-06-27 DIAGNOSIS — Z79899 Other long term (current) drug therapy: Secondary | ICD-10-CM | POA: Diagnosis not present

## 2019-06-27 DIAGNOSIS — I5023 Acute on chronic systolic (congestive) heart failure: Secondary | ICD-10-CM | POA: Diagnosis not present

## 2019-06-27 DIAGNOSIS — I2510 Atherosclerotic heart disease of native coronary artery without angina pectoris: Secondary | ICD-10-CM | POA: Diagnosis not present

## 2019-06-27 DIAGNOSIS — Z87891 Personal history of nicotine dependence: Secondary | ICD-10-CM | POA: Diagnosis not present

## 2019-06-27 DIAGNOSIS — I10 Essential (primary) hypertension: Secondary | ICD-10-CM | POA: Diagnosis not present

## 2019-06-27 DIAGNOSIS — Z7982 Long term (current) use of aspirin: Secondary | ICD-10-CM | POA: Diagnosis not present

## 2019-06-27 NOTE — Progress Notes (Signed)
Daily Session Note  Patient Details  Name: Damon English MRN: [id number] Date of Birth: 1951/02/06 Referring Provider:     Cardiac Rehab from 05/31/2019 in [facility] Cardiac and Pulmonary Rehab  Referring Provider  Cloretta Ned MD      Encounter Date: 06/27/2019  Check In: Session Check In - 06/27/19 1049      Check-In   Supervising physician immediately available to respond to emergencies  See telemetry face sheet for immediately available ER MD    Location  [facility]-Cardiac & Pulmonary Rehab    Staff Present  Renita Papa, RN Vickki Hearing, BA, ACSM CEP, Exercise Physiologist;Jeanna Durrell BS, Exercise Physiologist    Virtual Visit  No    Medication changes reported      No    Fall or balance concerns reported     No    Warm-up and Cool-down  Performed on first and last piece of equipment    Resistance Training Performed  Yes    VAD Patient?  No    PAD/SET Patient?  No      Pain Assessment   Currently in Pain?  No/denies          Social History   Tobacco Use  Smoking Status Former Smoker  . Packs/day: 2.00  . Years: 40.00  . Pack years: 80.00  . Quit date: 11/27/2010  . Years since quitting: 8.5  Smokeless Tobacco Never Used  Tobacco Comment   Quit in 2012    Goals Met:  Independence with exercise equipment Exercise tolerated well No report of cardiac concerns or symptoms Strength training completed today  Goals Unmet:  Not Applicable  Comments: Pt able to follow exercise prescription today without complaint.  Will continue to monitor for progression.    Dr. Emily Filbert is Medical Director for [place] and [facility].

## 2019-07-02 ENCOUNTER — Other Ambulatory Visit: Payer: Self-pay

## 2019-07-02 ENCOUNTER — Encounter: Payer: Medicare Other | Admitting: *Deleted

## 2019-07-02 DIAGNOSIS — I214 Non-ST elevation (NSTEMI) myocardial infarction: Secondary | ICD-10-CM

## 2019-07-02 DIAGNOSIS — Z7901 Long term (current) use of anticoagulants: Secondary | ICD-10-CM | POA: Diagnosis not present

## 2019-07-02 DIAGNOSIS — Z7289 Other problems related to lifestyle: Secondary | ICD-10-CM | POA: Diagnosis not present

## 2019-07-02 DIAGNOSIS — Z87891 Personal history of nicotine dependence: Secondary | ICD-10-CM | POA: Diagnosis not present

## 2019-07-02 DIAGNOSIS — E1151 Type 2 diabetes mellitus with diabetic peripheral angiopathy without gangrene: Secondary | ICD-10-CM | POA: Diagnosis not present

## 2019-07-02 DIAGNOSIS — Z7982 Long term (current) use of aspirin: Secondary | ICD-10-CM | POA: Diagnosis not present

## 2019-07-02 DIAGNOSIS — I10 Essential (primary) hypertension: Secondary | ICD-10-CM | POA: Diagnosis not present

## 2019-07-02 DIAGNOSIS — Z86711 Personal history of pulmonary embolism: Secondary | ICD-10-CM | POA: Diagnosis not present

## 2019-07-02 DIAGNOSIS — Z79899 Other long term (current) drug therapy: Secondary | ICD-10-CM | POA: Diagnosis not present

## 2019-07-02 DIAGNOSIS — Z7902 Long term (current) use of antithrombotics/antiplatelets: Secondary | ICD-10-CM | POA: Diagnosis not present

## 2019-07-02 DIAGNOSIS — Z951 Presence of aortocoronary bypass graft: Secondary | ICD-10-CM

## 2019-07-02 NOTE — Progress Notes (Signed)
Daily Session Note  Patient Details  Name: DELBERT DARLEY MRN: [id number] Date of Birth: Jul 19, 1951 Referring Provider:     Cardiac Rehab from 05/31/2019 in [facility] Cardiac and Pulmonary Rehab  Referring Provider  Cloretta Ned MD      Encounter Date: 07/02/2019  Check In: Session Check In - 07/02/19 1112      Check-In   Supervising physician immediately available to respond to emergencies  See telemetry face sheet for immediately available ER MD    Location  [facility]-Cardiac & Pulmonary Rehab    Staff Present  Heath Lark, RN, BSN, CCRP;Melissa Alpine RDN, LDN;Joseph [employer], [place], ACSM CEP, Exercise Physiologist    Virtual Visit  No    Medication changes reported      No    Fall or balance concerns reported     No    Warm-up and Cool-down  Performed on first and last piece of equipment    Resistance Training Performed  Yes    VAD Patient?  No    PAD/SET Patient?  No      Pain Assessment   Currently in Pain?  No/denies          Social History   Tobacco Use  Smoking Status Former Smoker  . Packs/day: 2.00  . Years: 40.00  . Pack years: 80.00  . Quit date: 11/27/2010  . Years since quitting: 8.6  Smokeless Tobacco Never Used  Tobacco Comment   Quit in 2012    Goals Met:  Independence with exercise equipment Exercise tolerated well No report of cardiac concerns or symptoms  Goals Unmet:  Not Applicable  Comments: Pt able to follow exercise prescription today without complaint.  Will continue to monitor for progression. PHQ 9 repeated today     Dr. Emily Filbert is Medical Director for [place] and [facility].

## 2019-07-09 ENCOUNTER — Other Ambulatory Visit: Payer: Self-pay

## 2019-07-09 ENCOUNTER — Encounter: Payer: Medicare Other | Admitting: *Deleted

## 2019-07-09 DIAGNOSIS — Z7982 Long term (current) use of aspirin: Secondary | ICD-10-CM | POA: Diagnosis not present

## 2019-07-09 DIAGNOSIS — Z79899 Other long term (current) drug therapy: Secondary | ICD-10-CM | POA: Diagnosis not present

## 2019-07-09 DIAGNOSIS — Z951 Presence of aortocoronary bypass graft: Secondary | ICD-10-CM

## 2019-07-09 DIAGNOSIS — Z7289 Other problems related to lifestyle: Secondary | ICD-10-CM | POA: Diagnosis not present

## 2019-07-09 DIAGNOSIS — I214 Non-ST elevation (NSTEMI) myocardial infarction: Secondary | ICD-10-CM | POA: Diagnosis not present

## 2019-07-09 DIAGNOSIS — Z86711 Personal history of pulmonary embolism: Secondary | ICD-10-CM | POA: Diagnosis not present

## 2019-07-09 DIAGNOSIS — E1151 Type 2 diabetes mellitus with diabetic peripheral angiopathy without gangrene: Secondary | ICD-10-CM | POA: Diagnosis not present

## 2019-07-09 DIAGNOSIS — I10 Essential (primary) hypertension: Secondary | ICD-10-CM | POA: Diagnosis not present

## 2019-07-09 DIAGNOSIS — Z87891 Personal history of nicotine dependence: Secondary | ICD-10-CM | POA: Diagnosis not present

## 2019-07-09 DIAGNOSIS — Z7902 Long term (current) use of antithrombotics/antiplatelets: Secondary | ICD-10-CM | POA: Diagnosis not present

## 2019-07-09 DIAGNOSIS — Z7901 Long term (current) use of anticoagulants: Secondary | ICD-10-CM | POA: Diagnosis not present

## 2019-07-09 NOTE — Progress Notes (Signed)
Daily Session Note  Patient Details  Name: Damon English MRN: [id number] Date of Birth: Feb 09, 1951 Referring Provider:     Cardiac Rehab from 05/31/2019 in [facility] Cardiac and Pulmonary Rehab  Referring Provider  Cloretta Ned MD      Encounter Date: 07/09/2019  Check In: Session Check In - 07/09/19 1120      Check-In   Supervising physician immediately available to respond to emergencies  See telemetry face sheet for immediately available ER MD    Location  [facility]-Cardiac & Pulmonary Rehab    Staff Present  Heath Lark, RN, BSN, CCRP;Joseph Hood RCP,RRT,BSRT;Amanda Oletta Darter, [place], ACSM CEP, Exercise Physiologist    Virtual Visit  No    Medication changes reported      No    Fall or balance concerns reported     No    Warm-up and Cool-down  Performed on first and last piece of equipment    Resistance Training Performed  Yes    VAD Patient?  No    PAD/SET Patient?  No      Pain Assessment   Currently in Pain?  No/denies          Social History   Tobacco Use  Smoking Status Former Smoker  . Packs/day: 2.00  . Years: 40.00  . Pack years: 80.00  . Quit date: 11/27/2010  . Years since quitting: 8.6  Smokeless Tobacco Never Used  Tobacco Comment   Quit in 2012    Goals Met:  Independence with exercise equipment Exercise tolerated well No report of cardiac concerns or symptoms  Goals Unmet:  Not Applicable  Comments: Pt able to follow exercise prescription today without complaint.  Will continue to monitor for progression.    Dr. Emily Filbert is Medical Director for [place] and [facility].

## 2019-07-11 ENCOUNTER — Other Ambulatory Visit: Payer: Self-pay

## 2019-07-11 DIAGNOSIS — L408 Other psoriasis: Secondary | ICD-10-CM | POA: Diagnosis not present

## 2019-07-11 DIAGNOSIS — D485 Neoplasm of uncertain behavior of skin: Secondary | ICD-10-CM | POA: Diagnosis not present

## 2019-07-11 DIAGNOSIS — L400 Psoriasis vulgaris: Secondary | ICD-10-CM | POA: Diagnosis not present

## 2019-07-11 DIAGNOSIS — Z7289 Other problems related to lifestyle: Secondary | ICD-10-CM | POA: Diagnosis not present

## 2019-07-11 DIAGNOSIS — I10 Essential (primary) hypertension: Secondary | ICD-10-CM | POA: Diagnosis not present

## 2019-07-11 DIAGNOSIS — Z79899 Other long term (current) drug therapy: Secondary | ICD-10-CM | POA: Diagnosis not present

## 2019-07-11 DIAGNOSIS — Z7902 Long term (current) use of antithrombotics/antiplatelets: Secondary | ICD-10-CM | POA: Diagnosis not present

## 2019-07-11 DIAGNOSIS — E1151 Type 2 diabetes mellitus with diabetic peripheral angiopathy without gangrene: Secondary | ICD-10-CM | POA: Diagnosis not present

## 2019-07-11 DIAGNOSIS — Z87891 Personal history of nicotine dependence: Secondary | ICD-10-CM | POA: Diagnosis not present

## 2019-07-11 DIAGNOSIS — Z7982 Long term (current) use of aspirin: Secondary | ICD-10-CM | POA: Diagnosis not present

## 2019-07-11 DIAGNOSIS — D2239 Melanocytic nevi of other parts of face: Secondary | ICD-10-CM | POA: Diagnosis not present

## 2019-07-11 DIAGNOSIS — Z86711 Personal history of pulmonary embolism: Secondary | ICD-10-CM | POA: Diagnosis not present

## 2019-07-11 DIAGNOSIS — I214 Non-ST elevation (NSTEMI) myocardial infarction: Secondary | ICD-10-CM | POA: Diagnosis not present

## 2019-07-11 DIAGNOSIS — Z7901 Long term (current) use of anticoagulants: Secondary | ICD-10-CM | POA: Diagnosis not present

## 2019-07-11 DIAGNOSIS — L820 Inflamed seborrheic keratosis: Secondary | ICD-10-CM | POA: Diagnosis not present

## 2019-07-11 DIAGNOSIS — Z951 Presence of aortocoronary bypass graft: Secondary | ICD-10-CM | POA: Diagnosis not present

## 2019-07-11 DIAGNOSIS — D225 Melanocytic nevi of trunk: Secondary | ICD-10-CM | POA: Diagnosis not present

## 2019-07-11 NOTE — Progress Notes (Signed)
Daily Session Note  Patient Details  Name: Damon English MRN: [id number] Date of Birth: 04-16-1951 Referring Provider:     Cardiac Rehab from 05/31/2019 in [facility] Cardiac and Pulmonary Rehab  Referring Provider  Cloretta Ned MD      Encounter Date: 07/11/2019  Check In:      Social History   Tobacco Use  Smoking Status Former Smoker  . Packs/day: 2.00  . Years: 40.00  . Pack years: 80.00  . Quit date: 11/27/2010  . Years since quitting: 8.6  Smokeless Tobacco Never Used  Tobacco Comment   Quit in 2012    Goals Met:  Independence with exercise equipment Exercise tolerated well No report of cardiac concerns or symptoms Strength training completed today  Goals Unmet:  Not Applicable  Comments: Pt able to follow exercise prescription today without complaint.  Will continue to monitor for progression.    Dr. Emily Filbert is Medical Director for [place] and [facility].

## 2019-07-16 ENCOUNTER — Encounter: Payer: Medicare Other | Admitting: *Deleted

## 2019-07-16 ENCOUNTER — Other Ambulatory Visit: Payer: Self-pay

## 2019-07-16 DIAGNOSIS — Z87891 Personal history of nicotine dependence: Secondary | ICD-10-CM | POA: Diagnosis not present

## 2019-07-16 DIAGNOSIS — I214 Non-ST elevation (NSTEMI) myocardial infarction: Secondary | ICD-10-CM

## 2019-07-16 DIAGNOSIS — I10 Essential (primary) hypertension: Secondary | ICD-10-CM | POA: Diagnosis not present

## 2019-07-16 DIAGNOSIS — Z951 Presence of aortocoronary bypass graft: Secondary | ICD-10-CM | POA: Diagnosis not present

## 2019-07-16 DIAGNOSIS — E1151 Type 2 diabetes mellitus with diabetic peripheral angiopathy without gangrene: Secondary | ICD-10-CM | POA: Diagnosis not present

## 2019-07-16 DIAGNOSIS — Z7901 Long term (current) use of anticoagulants: Secondary | ICD-10-CM | POA: Diagnosis not present

## 2019-07-16 DIAGNOSIS — Z7289 Other problems related to lifestyle: Secondary | ICD-10-CM | POA: Diagnosis not present

## 2019-07-16 DIAGNOSIS — Z7982 Long term (current) use of aspirin: Secondary | ICD-10-CM | POA: Diagnosis not present

## 2019-07-16 DIAGNOSIS — Z7902 Long term (current) use of antithrombotics/antiplatelets: Secondary | ICD-10-CM | POA: Diagnosis not present

## 2019-07-16 DIAGNOSIS — Z79899 Other long term (current) drug therapy: Secondary | ICD-10-CM | POA: Diagnosis not present

## 2019-07-16 DIAGNOSIS — Z86711 Personal history of pulmonary embolism: Secondary | ICD-10-CM | POA: Diagnosis not present

## 2019-07-16 NOTE — Progress Notes (Signed)
Daily Session Note  Patient Details  Name: Damon English MRN: [id number] Date of Birth: 02/11/1951 Referring Provider:     Cardiac Rehab from 05/31/2019 in [facility] Cardiac and Pulmonary Rehab  Referring Provider  Blazing, Michael MD      Encounter Date: 07/16/2019  Check In: Session Check In - 07/16/19 1122      Check-In   Supervising physician immediately available to respond to emergencies  See telemetry face sheet for immediately available ER MD    Location  [facility]-Cardiac & Pulmonary Rehab    Staff Present  Krista Spencer, RN BSN;Amanda Sommer, BA, ACSM CEP, Exercise Physiologist;Diane Wright RN,BSN;Joseph Hood RCP,RRT,BSRT;Melissa Caiola RDN, LDN    Virtual Visit  No    Medication changes reported      No    Fall or balance concerns reported     No    Warm-up and Cool-down  Performed on first and last piece of equipment    Resistance Training Performed  Yes    VAD Patient?  No    PAD/SET Patient?  No      Pain Assessment   Currently in Pain?  No/denies          Social History   Tobacco Use  Smoking Status Former Smoker  . Packs/day: 2.00  . Years: 40.00  . Pack years: 80.00  . Quit date: 11/27/2010  . Years since quitting: 8.6  Smokeless Tobacco Never Used  Tobacco Comment   Quit in 2012    Goals Met:  Independence with exercise equipment Exercise tolerated well No report of cardiac concerns or symptoms Strength training completed today  Goals Unmet:  Not Applicable  Comments: Pt able to follow exercise prescription today without complaint.  Will continue to monitor for progression.    Dr. Mark Miller is Medical Director for [facility] and [facility]. 

## 2019-07-18 ENCOUNTER — Other Ambulatory Visit: Payer: Self-pay

## 2019-07-18 ENCOUNTER — Encounter: Payer: Medicare Other | Attending: Cardiovascular Disease | Admitting: *Deleted

## 2019-07-18 DIAGNOSIS — Z7289 Other problems related to lifestyle: Secondary | ICD-10-CM | POA: Insufficient documentation

## 2019-07-18 DIAGNOSIS — I10 Essential (primary) hypertension: Secondary | ICD-10-CM | POA: Insufficient documentation

## 2019-07-18 DIAGNOSIS — Z86711 Personal history of pulmonary embolism: Secondary | ICD-10-CM | POA: Diagnosis not present

## 2019-07-18 DIAGNOSIS — Z7902 Long term (current) use of antithrombotics/antiplatelets: Secondary | ICD-10-CM | POA: Diagnosis not present

## 2019-07-18 DIAGNOSIS — Z7982 Long term (current) use of aspirin: Secondary | ICD-10-CM | POA: Diagnosis not present

## 2019-07-18 DIAGNOSIS — Z951 Presence of aortocoronary bypass graft: Secondary | ICD-10-CM | POA: Diagnosis not present

## 2019-07-18 DIAGNOSIS — Z79899 Other long term (current) drug therapy: Secondary | ICD-10-CM | POA: Diagnosis not present

## 2019-07-18 DIAGNOSIS — I214 Non-ST elevation (NSTEMI) myocardial infarction: Secondary | ICD-10-CM | POA: Diagnosis not present

## 2019-07-18 DIAGNOSIS — E1151 Type 2 diabetes mellitus with diabetic peripheral angiopathy without gangrene: Secondary | ICD-10-CM | POA: Diagnosis not present

## 2019-07-18 DIAGNOSIS — Z87891 Personal history of nicotine dependence: Secondary | ICD-10-CM | POA: Diagnosis not present

## 2019-07-18 DIAGNOSIS — Z7901 Long term (current) use of anticoagulants: Secondary | ICD-10-CM | POA: Diagnosis not present

## 2019-07-18 NOTE — Progress Notes (Signed)
Daily Session Note  Patient Details  Name: Damon English MRN: [id number] Date of Birth: 19-May-1951 Referring Provider:     Cardiac Rehab from 05/31/2019 in [facility] Cardiac and Pulmonary Rehab  Referring Provider  Cloretta Ned MD      Encounter Date: 07/18/2019  Check In: Session Check In - 07/18/19 1044      Check-In   Supervising physician immediately available to respond to emergencies  See telemetry face sheet for immediately available ER MD    Location  [facility]-Cardiac & Pulmonary Rehab    Staff Present  Renita Papa, RN BSN;Jessica Carlisle, MA, RCEP, CCRP, CCET;Amanda Sommer, [place], ACSM CEP, Exercise Physiologist    Virtual Visit  No    Medication changes reported      No    Fall or balance concerns reported     No    Warm-up and Cool-down  Performed on first and last piece of equipment    Resistance Training Performed  Yes    VAD Patient?  No      Pain Assessment   Currently in Pain?  No/denies          Social History   Tobacco Use  Smoking Status Former Smoker  . Packs/day: 2.00  . Years: 40.00  . Pack years: 80.00  . Quit date: 11/27/2010  . Years since quitting: 8.6  Smokeless Tobacco Never Used  Tobacco Comment   Quit in 2012    Goals Met:  Independence with exercise equipment Exercise tolerated well No report of cardiac concerns or symptoms Strength training completed today  Goals Unmet:  Not Applicable  Comments: Pt able to follow exercise prescription today without complaint.  Will continue to monitor for progression.    Dr. Emily Filbert is Medical Director for [place] and [facility].

## 2019-07-24 ENCOUNTER — Encounter: Payer: Self-pay | Admitting: *Deleted

## 2019-07-24 DIAGNOSIS — Z951 Presence of aortocoronary bypass graft: Secondary | ICD-10-CM

## 2019-07-24 DIAGNOSIS — I214 Non-ST elevation (NSTEMI) myocardial infarction: Secondary | ICD-10-CM

## 2019-07-24 NOTE — Progress Notes (Signed)
Cardiac Individual Treatment Plan  Patient Details  Name: Damon English MRN: [id number] Date of Birth: 31-Dec-1950 Referring Provider:     Cardiac Rehab from 05/31/2019 in [facility] Cardiac and Pulmonary Rehab  Referring Provider  Cloretta Ned MD      Initial Encounter Date:    Cardiac Rehab from 05/31/2019 in [facility] Cardiac and Pulmonary Rehab  Date  05/31/19      Visit Diagnosis: NSTEMI (non-ST elevation myocardial infarction) ([place])  S/P CABG x 3  Patient's Home Medications on Admission:  Current Outpatient Medications:  .  apixaban (ELIQUIS) 5 MG TABS tablet, Take by mouth., Disp: , Rfl:  .  aspirin 81 MG chewable tablet, Chew by mouth., Disp: , Rfl:  .  atorvastatin (LIPITOR) 80 MG tablet, Take by mouth., Disp: , Rfl:  .  clonazePAM (KLONOPIN) 0.5 MG tablet, Take 0.25-0.5 mg by mouth 3 (three) times daily as needed for anxiety. , Disp: , Rfl:  .  clopidogrel (PLAVIX) 75 MG tablet, Take 75 mg by mouth daily., Disp: , Rfl:  .  DULoxetine (CYMBALTA) 60 MG capsule, Take 1 capsule (60 mg total) by mouth 2 (two) times daily. (Patient not taking: Reported on 03/27/2019), Disp: 20 capsule, Rfl: 0 .  esomeprazole (NEXIUM) 40 MG capsule, Take 1 capsule by mouth daily., Disp: , Rfl:  .  famotidine (PEPCID) 40 MG tablet, Take 1 tablet by mouth every morning., Disp: , Rfl:  .  HYDROcodone-acetaminophen (NORCO/VICODIN) 5-325 MG tablet, Take 0.5-1 tablets by mouth 2 (two) times daily as needed for pain., Disp: , Rfl:  .  Latanoprost 0.005 % EMUL, Apply to eye., Disp: , Rfl:  .  Magnesium (V-R MAGNESIUM) 250 MG TABS, Take by mouth., Disp: , Rfl:  .  metoprolol succinate (TOPROL-XL) 50 MG 24 hr tablet, Take 50 mg by mouth 2 (two) times a day. Take with or immediately following a meal., Disp: , Rfl:  .  ramipril (ALTACE) 2.5 MG capsule, Take by mouth., Disp: , Rfl:  .  ranolazine (RANEXA) 500 MG 12 hr tablet, Take 500 mg by mouth 2 (two) times daily., Disp: , Rfl:  .  SENNOSIDES PO, Take by mouth.,  Disp: , Rfl:  .  spironolactone (ALDACTONE) 25 MG tablet, Take 25 mg by mouth., Disp: , Rfl:  .  sucralfate (CARAFATE) 1 g tablet, Take 1 tablet by mouth 4 (four) times daily -  before meals and at bedtime., Disp: , Rfl:   Past Medical History: Past Medical History:  Diagnosis Date  . Alcohol use    "cutting back" but still heavy and daily, multiple shots of bourbon each night  . Diabetes mellitus without complication ([place])   . History of tobacco use    reportedly quit in 2013  . Hypertension   . Pulmonary embolism ([place]) 2012   unprovoked  . PVD (peripheral vascular disease) ([place])     Tobacco Use: Social History   Tobacco Use  Smoking Status Former Smoker  . Packs/day: 2.00  . Years: 40.00  . Pack years: 80.00  . Quit date: 11/27/2010  . Years since quitting: 8.6  Smokeless Tobacco Never Used  Tobacco Comment   Quit in 2012    Labs: Recent Review Flowsheet Data    Labs for ITP Cardiac and Pulmonary Rehab Latest Ref Rng & Units 01/28/2019 01/29/2019   Cholestrol 0 - 200 mg/dL 168 143   LDLCALC 0 - 99 mg/dL 113(H) 93   HDL >40 mg/dL 36(L) 33(L)   Trlycerides <150 mg/dL 97  87       Exercise Target Goals: Exercise Program Goal: Individual exercise prescription set using results from initial 6 min walk test and THRR while considering  patient's activity barriers and safety.   Exercise Prescription Goal: Initial exercise prescription builds to 30-45 minutes a day of aerobic activity, 2-3 days per week.  Home exercise guidelines will be given to patient during program as part of exercise prescription that the participant will acknowledge.  Activity Barriers & Risk Stratification: Activity Barriers & Cardiac Risk Stratification - 05/31/19 1238      Activity Barriers & Cardiac Risk Stratification   Activity Barriers  Muscular Weakness;Deconditioning;Back Problems;Other (comment);Chest Pain/Angina;Shortness of Breath;Assistive Device;Balance Concerns;Decreased Ventricular  Function    Comments  R leg numb/pain post-op, Hx back surgery, PAD, r foot numb, uses walker on occasion    Cardiac Risk Stratification  High       6 Minute Walk: 6 Minute Walk    Row Name 05/31/19 1234         6 Minute Walk   Phase  Initial     Distance  370 feet     Walk Time  3.97 minutes     # of Rest Breaks  4 15 sec, 15 sec, 20 sec, stopped at 4:48     MPH  1.06     METS  1.91     RPE  13     Perceived Dyspnea   1     VO2 Peak  6.67     Symptoms  Yes (comment)     Comments  fatigue, SOB, R hip/back support, chest pain 5/10     Resting HR  100 bpm     Resting BP  142/70     Resting Oxygen Saturation   98 %     Exercise Oxygen Saturation  during 6 min walk  96 %     Max Ex. HR  134 bpm     Max Ex. BP  152/74     2 Minute Post BP  146/64        Oxygen Initial Assessment:   Oxygen Re-Evaluation:   Oxygen Discharge (Final Oxygen Re-Evaluation):   Initial Exercise Prescription: Initial Exercise Prescription - 05/31/19 1200      Date of Initial Exercise RX and Referring Provider   Date  05/31/19    Referring Provider  Cloretta Ned MD      Treadmill   MPH  0.8    Grade  0    Minutes  15    METs  1.6      NuStep   Level  1    SPM  80    Minutes  15    METs  1.5      REL-XR   Level  1    Speed  50    Minutes  15    METs  1.5      T5 Nustep   Level  1    SPM  80    Minutes  15    METs  1.5      Prescription Details   Frequency (times per week)  2    Duration  Progress to 30 minutes of continuous aerobic without signs/symptoms of physical distress      Intensity   THRR 40-80% of Max Heartrate  122-143    Ratings of Perceived Exertion  11-13    Perceived Dyspnea  0-4      Progression   Progression  Continue to progress workloads to maintain intensity without signs/symptoms of physical distress.      Resistance Training   Training Prescription  Yes    Weight  3 lbs    Reps  10-15       Perform Capillary Blood Glucose checks as  needed.  Exercise Prescription Changes: Exercise Prescription Changes    Row Name 05/31/19 1200 06/07/19 0800 06/11/19 1500 06/26/19 1100 07/09/19 1500     Response to Exercise   Blood Pressure (Admit)  142/70  124/58  148/70  140/76  140/68   Blood Pressure (Exercise)  152/74  148/74  150/80  152/74  152/76   Blood Pressure (Exit)  146/64  120/80  140/80  124/66  114/50   Heart Rate (Admit)  100 bpm  66 bpm  69 bpm  71 bpm  78 bpm   Heart Rate (Exercise)  134 bpm  93 bpm  98 bpm  103 bpm  91 bpm   Heart Rate (Exit)  93 bpm  64 bpm  68 bpm  69 bpm  68 bpm   Oxygen Saturation (Admit)  98 %  -  -  -  -   Oxygen Saturation (Exercise)  96 %  -  -  -  -   Rating of Perceived Exertion (Exercise)  _0 Perceived Dyspnea (Exercise)  1  -  -  -  -   Symptoms  fatigue, SOB, chest pain  -  -  -  none   Comments  walk test results  -  -  -  -   Duration  -  Progress to 30 minutes of  aerobic without signs/symptoms of physical distress  Progress to 30 minutes of  aerobic without signs/symptoms of physical distress  Progress to 30 minutes of  aerobic without signs/symptoms of physical distress  Progress to 30 minutes of  aerobic without signs/symptoms of physical distress   Intensity  -  THRR unchanged  THRR unchanged  THRR unchanged  THRR unchanged     Progression   Progression  -  Continue to progress workloads to maintain intensity without signs/symptoms of physical distress.  Continue to progress workloads to maintain intensity without signs/symptoms of physical distress.  Continue to progress workloads to maintain intensity without signs/symptoms of physical distress.  Continue to progress workloads to maintain intensity without signs/symptoms of physical distress.   Average METs  -  1.9  1.8  1.8  1.6     Resistance Training   Training Prescription  -  Yes  Yes  Yes  Yes   Weight  -  2 lb per session detail sheet  3 lb  3 lb  3 lbs   Reps  -  10-15  10-15  10-15  10-15      Interval Training   Interval Training  -  No  No  No  - n     NuStep   Level  -  1  -  -  1   SPM  -  80  -  -  -   Minutes  -  15  -  -  30   METs  -  1.9  -  -  1.6     T5 Nustep   Level  -  -  -  1  -   SPM  -  -  -  80  -   Minutes  -  -  -  15  -   METs  -  -  -  1.8  -     Home Exercise Plan   Plans to continue exercise at  -  -  -  -  Home (comment) walking, staff videos   Frequency  -  -  -  -  Add 2 additional days to program exercise sessions.   Initial Home Exercises Provided  -  -  -  -  06/13/19      Exercise Comments: Exercise Comments    Row Name 06/06/19 1137 07/24/19 1337         Exercise Comments  Continues to experience angina symptoms. Has been able to stop, rest and symptoms resolve.  Resent links for YouTube videos in order to follow exercise prescription at home.         Exercise Goals and Review: Exercise Goals    Row Name 05/31/19 1241             Exercise Goals   Increase Physical Activity  Yes       Intervention  Provide advice, education, support and counseling about physical activity/exercise needs.;Develop an individualized exercise prescription for aerobic and resistive training based on initial evaluation findings, risk stratification, comorbidities and participant's personal goals.       Expected Outcomes  Short Term: Attend rehab on a regular basis to increase amount of physical activity.;Long Term: Add in home exercise to make exercise part of routine and to increase amount of physical activity.;Long Term: Exercising regularly at least 3-5 days a week.       Increase Strength and Stamina  Yes       Intervention  Provide advice, education, support and counseling about physical activity/exercise needs.;Develop an individualized exercise prescription for aerobic and resistive training based on initial evaluation findings, risk stratification, comorbidities and participant's personal goals.       Expected Outcomes  Short Term: Increase  workloads from initial exercise prescription for resistance, speed, and METs.;Short Term: Perform resistance training exercises routinely during rehab and add in resistance training at home;Long Term: Improve cardiorespiratory fitness, muscular endurance and strength as measured by increased METs and functional capacity (6MWT)       Able to understand and use rate of perceived exertion (RPE) scale  Yes       Intervention  Provide education and explanation on how to use RPE scale       Expected Outcomes  Short Term: Able to use RPE daily in rehab to express subjective intensity level;Long Term:  Able to use RPE to guide intensity level when exercising independently       Able to understand and use Dyspnea scale  Yes       Intervention  Provide education and explanation on how to use Dyspnea scale       Expected Outcomes  Short Term: Able to use Dyspnea scale daily in rehab to express subjective sense of shortness of breath during exertion;Long Term: Able to use Dyspnea scale to guide intensity level when exercising independently       Knowledge and understanding of Target Heart Rate Range (THRR)  Yes       Intervention  Provide education and explanation of THRR including how the numbers were predicted and where they are located for reference       Expected Outcomes  Short Term: Able to state/look up THRR;Short Term: Able to use daily as guideline for intensity in rehab;Long Term: Able to use THRR to govern  intensity when exercising independently       Able to check pulse independently  Yes       Intervention  Provide education and demonstration on how to check pulse in carotid and radial arteries.;Review the importance of being able to check your own pulse for safety during independent exercise       Expected Outcomes  Short Term: Able to explain why pulse checking is important during independent exercise;Long Term: Able to check pulse independently and accurately       Understanding of Exercise  Prescription  Yes       Intervention  Provide education, explanation, and written materials on patient's individual exercise prescription       Expected Outcomes  Short Term: Able to explain program exercise prescription;Long Term: Able to explain home exercise prescription to exercise independently          Exercise Goals Re-Evaluation : Exercise Goals Re-Evaluation    Row Name 04/30/19 1340 06/11/19 1549 06/13/19 1107 06/26/19 1139 07/09/19 1528     Exercise Goal Re-Evaluation   Exercise Goals Review  Increase Physical Activity;Increase Strength and Stamina;Understanding of Exercise Prescription  Increase Physical Activity;Increase Strength and Stamina;Able to understand and use rate of perceived exertion (RPE) scale;Knowledge and understanding of Target Heart Rate Range (THRR);Understanding of Exercise Prescription  Increase Physical Activity;Increase Strength and Stamina;Understanding of Exercise Prescription  Increase Physical Activity;Increase Strength and Stamina;Able to understand and use rate of perceived exertion (RPE) scale;Knowledge and understanding of Target Heart Rate Range (THRR);Able to check pulse independently;Understanding of Exercise Prescription  Increase Physical Activity;Increase Strength and Stamina;Understanding of Exercise Prescription   Comments  Jahson has not been able to exercise per doctor's orders due to ongoing angina.  We will continue to follow progression.  Titus has done well since starting back to HT.  Staff will monitor progress  Dushaun is off to a good start in rehab.  Updated home exercise guidelines. Drayven plans to continue to walk at home. eviewed THR, pulse, RPE, sign and symptoms, NTG use, and when to call 911 or MD.  Also discussed weather considerations and indoor options.  Pt voiced understanding.  Reynold is doing well on the T5 Nustep.  He has better days than others but does have good effort when he attends.  he continues to rest if he has angina  Wilkes has been  doing well in rehab.  Last week he was having increased chest pain.  He was able to return today and did well.  We will continue to monitor his progress.   Expected Outcomes  Short: Get clearance to exercise.  Long: Cleared to start rehab.  Short : exercise consistently Long - increase MET level  Short: Start to add exercise in at home.  Long: Continue to attend regularly.  Short - be able to work longer before having to rest Long - reach 30 min of continuous exercise without symptoms  Short: Continue to work toward not having as much chest pain.  Long: Continue to add in exercise at home.      Discharge Exercise Prescription (Final Exercise Prescription Changes): Exercise Prescription Changes - 07/09/19 1500      Response to Exercise   Blood Pressure (Admit)  140/68    Blood Pressure (Exercise)  152/76    Blood Pressure (Exit)  114/50    Heart Rate (Admit)  78 bpm    Heart Rate (Exercise)  91 bpm    Heart Rate (Exit)  68 bpm    Rating  of Perceived Exertion (Exercise)  15    Symptoms  none    Duration  Progress to 30 minutes of  aerobic without signs/symptoms of physical distress    Intensity  THRR unchanged      Progression   Progression  Continue to progress workloads to maintain intensity without signs/symptoms of physical distress.    Average METs  1.6      Resistance Training   Training Prescription  Yes    Weight  3 lbs    Reps  10-15      Interval Training   Interval Training  --   n     NuStep   Level  1    Minutes  30    METs  1.6      Home Exercise Plan   Plans to continue exercise at  Home (comment)   walking, staff videos   Frequency  Add 2 additional days to program exercise sessions.    Initial Home Exercises Provided  06/13/19       Nutrition:  Target Goals: Understanding of nutrition guidelines, daily intake of sodium '1500mg'$ , cholesterol '200mg'$ , calories 30% from fat and 7% or less from saturated fats, daily to have 5 or more servings of fruits and  vegetables.  Biometrics: Pre Biometrics - 05/31/19 1241      Pre Biometrics   Height  5' 6.8" (1.697 m)    Weight  188 lb 8 oz (85.5 kg)    BMI (Calculated)  29.69    Single Leg Stand  1.44 seconds        Nutrition Therapy Plan and Nutrition Goals: Nutrition Therapy & Goals - 04/15/19 1334      Nutrition Therapy   Diet  HH, low Na diet    Protein (specify units)  75g    Fiber  25 grams    Whole Grain Foods  3 servings    Saturated Fats  12 max. grams    Fruits and Vegetables  5 servings/day    Sodium  1.5 grams      Personal Nutrition Goals   Nutrition Goal  ST: switch out 1 cup of lemonade for 1 cup of >/=75% water & </=25% lemonade LT: maintain wt, keep HgA1c down    Comments  1.5-2 y ago 240lbs, now 185lbs (self reported); 206lbs (per chart). HgA1c: 6.1-6.5. T2DM controlled with diet (cut CHOs) B: Non-fat creamer & splenda; lost appetite for coffee in the hospital, now drinks decaf 2cups. B: fruits and/or cereal (cheerios) with fruit and almond milk. L: low CHO beer, (1/2 [place] sandwich w/ a bit of lite mayo and lettuce and tomato) and whole wheat pasta with zoodles and sauce. 2-3x/week will have hamburger (no cheese) on whole wheat bread or hamburger steak with muschrooms and onions. Pt reports watching sodium, limited fried food (every now and again will have fried chicken), lemonade (no cal, no sugar - splenda instead). D: vegetable soup (beans and lots of leafy greens). Will eat poultry and shrimp and scallops.      Intervention Plan   Intervention  Prescribe, educate and counsel regarding individualized specific dietary modifications aiming towards targeted core components such as weight, hypertension, lipid management, diabetes, heart failure and other comorbidities.;Nutrition handout(s) given to patient.   emailed pt handout   Expected Outcomes  Short Term Goal: Understand basic principles of dietary content, such as calories, fat, sodium, cholesterol and nutrients.;Short  Term Goal: A plan has been developed with personal nutrition goals set during dietitian appointment.;Long  Term Goal: Adherence to prescribed nutrition plan.       Nutrition Assessments: Nutrition Assessments - 05/29/19 1445      MEDFICTS Scores   Pre Score  12       Nutrition Goals Re-Evaluation: Nutrition Goals Re-Evaluation    Row Name 06/11/19 1111 07/16/19 1120           Goals   Nutrition Goal  ST:  B can add soymilk instead of almondmilk or add hempheart or ground flax LT: maintain wt, keep HgA1c down  ST:/ LT: continue HH eating maintain wt, keep HgA1c down      Comment  B: Non-fat creamer & splenda; lost appetite for coffee in the hospital, now drinks decaf 2cups. B: fruits and/or cereal (cheerios) with fruit and almond milk. L: low CHO beer, (1/2 [place] sandwich w/ a bit of lite mayo and lettuce and tomato) and whole wheat pasta with zoodles and sauce. 2-3x/week will have hamburger (no cheese) on whole wheat bread or hamburger steak with muschrooms and onions. Pt reports watching sodium, limited fried food (every now and again will have fried chicken), lemonade (no cal, no sugar - splenda instead). D: vegetable soup (beans and lots of leafy greens). Will eat poultry and shrimp and scallops. Pt mixing lemonade with water. Pt reports not eating as much as usual due to low appetite from surgery. Disucssed for B can add soymilk instead of almondmilk or add hempheart or ground flax for a nutrition boost (teeth are not great so doesn't eat nuts).  Pt reports doing well, choosing healthier choices when out like fish or hamburger steak with vegetables and sometimes a baked potato, but does eat much of it. Pt reports still eating the same way since we last spoke and that he is doing well. Pt rpeorts not needing anything from the RD at this time.      Expected Outcome  ST:  B can add soymilk instead of almondmilk or add hempheart or ground flax LT: maintain wt, keep HgA1c down  ST:/ LT: continue  HH eating maintain wt, keep HgA1c down         Nutrition Goals Discharge (Final Nutrition Goals Re-Evaluation): Nutrition Goals Re-Evaluation - 07/16/19 1120      Goals   Nutrition Goal  ST:/ LT: continue HH eating maintain wt, keep HgA1c down    Comment  Pt reports doing well, choosing healthier choices when out like fish or hamburger steak with vegetables and sometimes a baked potato, but does eat much of it. Pt reports still eating the same way since we last spoke and that he is doing well. Pt rpeorts not needing anything from the RD at this time.    Expected Outcome  ST:/ LT: continue HH eating maintain wt, keep HgA1c down       Psychosocial: Target Goals: Acknowledge presence or absence of significant depression and/or stress, maximize coping skills, provide positive support system. Participant is able to verbalize types and ability to use techniques and skills needed for reducing stress and depression.   Initial Review & Psychosocial Screening: Initial Psych Review & Screening - 05/28/19 1343      Initial Review   Current issues with  None Identified   Is taking Klonopin after several hospitalizations.  Med prescribed after all this and he feels he is doing good     Family Dynamics   Good Support System?  Yes   Wife     Barriers   Psychosocial barriers to participate in  program  There are no identifiable barriers or psychosocial needs.;The patient should benefit from training in stress management and relaxation.      Screening Interventions   Interventions  Encouraged to exercise;Provide feedback about the scores to participant;To provide support and resources with identified psychosocial needs    Expected Outcomes  Short Term goal: Utilizing psychosocial counselor, staff and physician to assist with identification of specific Stressors or current issues interfering with healing process. Setting desired goal for each stressor or current issue identified.;Long Term Goal:  Stressors or current issues are controlled or eliminated.;Short Term goal: Identification and review with participant of any Quality of Life or Depression concerns found by scoring the questionnaire.;Long Term goal: The participant improves quality of Life and PHQ9 Scores as seen by post scores and/or verbalization of changes       Quality of Life Scores:  Quality of Life - 05/29/19 1442      Quality of Life   Select  Quality of Life      Quality of Life Scores   Health/Function Pre  8.6 %    Socioeconomic Pre  26.43 %    Psych/Spiritual Pre  15.57 %    Family Pre  25.2 %    GLOBAL Pre  16.44 %      Scores of 19 and below usually indicate a poorer quality of life in these areas.  A difference of  2-3 points is a clinically meaningful difference.  A difference of 2-3 points in the total score of the Quality of Life Index has been associated with significant improvement in overall quality of life, self-image, physical symptoms, and general health in studies assessing change in quality of life.  PHQ-9: Recent Review Flowsheet Data    Depression screen [facility] 2/9 07/02/2019 05/31/2019   Decreased Interest 0 0   Down, Depressed, Hopeless 0 1   PHQ - 2 Score 0 1   Altered sleeping 1 3   Tired, decreased energy 1 3   Change in appetite 0 0   Feeling bad or failure about yourself  0 0   Trouble concentrating 0 0   Moving slowly or fidgety/restless 1 0   Suicidal thoughts 0 0   PHQ-9 Score 3 7   Difficult doing work/chores Not difficult at all Somewhat difficult     Interpretation of Total Score  Total Score Depression Severity:  1-4 = Minimal depression, 5-9 = Mild depression, 10-14 = Moderate depression, 15-19 = Moderately severe depression, 20-27 = Severe depression   Psychosocial Evaluation and Intervention:   Psychosocial Re-Evaluation: Psychosocial Re-Evaluation    [place] Name 06/13/19 1109 07/11/19 1107           Psychosocial Re-Evaluation   Current issues with  Current  Stress Concerns  -      Comments  Trask is doing well in rehab.  He is off to a good start.  He feels good mentally and tries not to let too many things get to him.  His health is his biggest issue.  He sleep pretty well.  Manning wakes up sometimes and cant go back to sleep.  He will talk to his Dr about this.  He does practice slow deep breathing when needed.      Expected Outcomes  Short: Exericse for boost.  Long: Continue to stay positive.  Short - continue to practice stress redution Long - manage stress and sleep      Interventions  Encouraged to attend Cardiac Rehabilitation for the exercise  -  Continue Psychosocial Services   Follow up required by staff  -         Psychosocial Discharge (Final Psychosocial Re-Evaluation): Psychosocial Re-Evaluation - 07/11/19 1107      Psychosocial Re-Evaluation   Comments  Tamarius wakes up sometimes and cant go back to sleep.  He will talk to his Dr about this.  He does practice slow deep breathing when needed.    Expected Outcomes  Short - continue to practice stress redution Long - manage stress and sleep       Vocational Rehabilitation: Provide vocational rehab assistance to qualifying candidates.   Vocational Rehab Evaluation & Intervention: Vocational Rehab - 05/28/19 1348      Initial Vocational Rehab Evaluation & Intervention   Assessment shows need for Vocational Rehabilitation  No       Education: Education Goals: Education classes will be provided on a variety of topics geared toward better understanding of heart health and risk factor modification. Participant will state understanding/return demonstration of topics presented as noted by education test scores.  Learning Barriers/Preferences:   Education Topics:  AED/CPR: - Group verbal and written instruction with the use of models to demonstrate the basic use of the AED with the basic ABC's of resuscitation.   General Nutrition Guidelines/Fats and Fiber: -Group instruction  provided by verbal, written material, models and posters to present the general guidelines for heart healthy nutrition. Gives an explanation and review of dietary fats and fiber.   Controlling Sodium/Reading Food Labels: -Group verbal and written material supporting the discussion of sodium use in heart healthy nutrition. Review and explanation with models, verbal and written materials for utilization of the food label.   Exercise Physiology & General Exercise Guidelines: - Group verbal and written instruction with models to review the exercise physiology of the cardiovascular system and associated critical values. Provides general exercise guidelines with specific guidelines to those with heart or lung disease.    Aerobic Exercise & Resistance Training: - Gives group verbal and written instruction on the various components of exercise. Focuses on aerobic and resistive training programs and the benefits of this training and how to safely progress through these programs..   Flexibility, Balance, Mind/Body Relaxation: Provides group verbal/written instruction on the benefits of flexibility and balance training, including mind/body exercise modes such as yoga, pilates and tai chi.  Demonstration and skill practice provided.   Stress and Anxiety: - Provides group verbal and written instruction about the health risks of elevated stress and causes of high stress.  Discuss the correlation between heart/lung disease and anxiety and treatment options. Review healthy ways to manage with stress and anxiety.   Depression: - Provides group verbal and written instruction on the correlation between heart/lung disease and depressed mood, treatment options, and the stigmas associated with seeking treatment.   Anatomy & Physiology of the Heart: - Group verbal and written instruction and models provide basic cardiac anatomy and physiology, with the coronary electrical and arterial systems. Review of  Valvular disease and Heart Failure   Cardiac Procedures: - Group verbal and written instruction to review commonly prescribed medications for heart disease. Reviews the medication, class of the drug, and side effects. Includes the steps to properly store meds and maintain the prescription regimen. (beta blockers and nitrates)   Cardiac Medications I: - Group verbal and written instruction to review commonly prescribed medications for heart disease. Reviews the medication, class of the drug, and side effects. Includes the steps to properly store meds and maintain the  prescription regimen.   Cardiac Medications II: -Group verbal and written instruction to review commonly prescribed medications for heart disease. Reviews the medication, class of the drug, and side effects. (all other drug classes)    Go Sex-Intimacy & Heart Disease, Get SMART - Goal Setting: - Group verbal and written instruction through game format to discuss heart disease and the return to sexual intimacy. Provides group verbal and written material to discuss and apply goal setting through the application of the S.M.A.R.T. Method.   Other Matters of the Heart: - Provides group verbal, written materials and models to describe Stable Angina and Peripheral Artery. Includes description of the disease process and treatment options available to the cardiac patient.   Exercise & Equipment Safety: - Individual verbal instruction and demonstration of equipment use and safety with use of the equipment.   Cardiac Rehab from 05/31/2019 in [facility] Cardiac and Pulmonary Rehab  Date  05/31/19  Educator  [facility]  Instruction Review Code  1- Verbalizes Understanding      Infection Prevention: - Provides verbal and written material to individual with discussion of infection control including proper hand washing and proper equipment cleaning during exercise session.   Cardiac Rehab from 05/31/2019 in [facility] Cardiac and Pulmonary Rehab  Date   05/31/19  Educator  [facility]  Instruction Review Code  1- Verbalizes Understanding      Falls Prevention: - Provides verbal and written material to individual with discussion of falls prevention and safety.   Cardiac Rehab from 05/31/2019 in [facility] Cardiac and Pulmonary Rehab  Date  05/31/19  Educator  [facility]  Instruction Review Code  1- Verbalizes Understanding      Diabetes: - Individual verbal and written instruction to review signs/symptoms of diabetes, desired ranges of glucose level fasting, after meals and with exercise. Acknowledge that pre and post exercise glucose checks will be done for 3 sessions at entry of program.   Know Your Numbers and Risk Factors: -Group verbal and written instruction about important numbers in your health.  Discussion of what are risk factors and how they play a role in the disease process.  Review of Cholesterol, Blood Pressure, Diabetes, and BMI and the role they play in your overall health.   Sleep Hygiene: -Provides group verbal and written instruction about how sleep can affect your health.  Define sleep hygiene, discuss sleep cycles and impact of sleep habits. Review good sleep hygiene tips.    Other: -Provides group and verbal instruction on various topics (see comments)   Knowledge Questionnaire Score: Knowledge Questionnaire Score - 05/29/19 1445      Knowledge Questionnaire Score   Pre Score  23/26   Education Focus: Nutrition and Exercise      Core Components/Risk Factors/Patient Goals at Admission: Personal Goals and Risk Factors at Admission - 05/31/19 1242      Core Components/Risk Factors/Patient Goals on Admission    Weight Management  Yes;Weight Loss    Intervention  Weight Management: Develop a combined nutrition and exercise program designed to reach desired caloric intake, while maintaining appropriate intake of nutrient and fiber, sodium and fats, and appropriate energy expenditure required for the weight goal.;Weight  Management: Provide education and appropriate resources to help participant work on and attain dietary goals.    Admit Weight  188 lb 8 oz (85.5 kg)    Goal Weight: Short Term  183 lb (83 kg)    Goal Weight: Long Term  180 lb (81.6 kg)    Expected Outcomes  Short  Term: Continue to assess and modify interventions until short term weight is achieved;Long Term: Adherence to nutrition and physical activity/exercise program aimed toward attainment of established weight goal;Weight Loss: Understanding of general recommendations for a balanced deficit meal plan, which promotes 1-2 lb weight loss per week and includes a negative energy balance of [phone] kcal/d;Understanding recommendations for meals to include 15-35% energy as protein, 25-35% energy from fat, 35-60% energy from carbohydrates, less than '200mg'$  of dietary cholesterol, 20-35 gm of total fiber daily;Understanding of distribution of calorie intake throughout the day with the consumption of 4-5 meals/snacks    Diabetes  Yes    Intervention  Provide education about signs/symptoms and action to take for hypo/hyperglycemia.;Provide education about proper nutrition, including hydration, and aerobic/resistive exercise prescription along with prescribed medications to achieve blood glucose in normal ranges: Fasting glucose 65-99 mg/dL    Expected Outcomes  Short Term: Participant verbalizes understanding of the signs/symptoms and immediate care of hyper/hypoglycemia, proper foot care and importance of medication, aerobic/resistive exercise and nutrition plan for blood glucose control.;Long Term: Attainment of HbA1C < 7%.    Hypertension  Yes    Intervention  Provide education on lifestyle modifcations including regular physical activity/exercise, weight management, moderate sodium restriction and increased consumption of fresh fruit, vegetables, and low fat dairy, alcohol moderation, and smoking cessation.;Monitor prescription use compliance.    Expected  Outcomes  Short Term: Continued assessment and intervention until BP is < 140/52m HG in hypertensive participants. < 130/882mHG in hypertensive participants with diabetes, heart failure or chronic kidney disease.;Long Term: Maintenance of blood pressure at goal levels.    Lipids  Yes    Intervention  Provide education and support for participant on nutrition & aerobic/resistive exercise along with prescribed medications to achieve LDL '70mg'$ , HDL >'40mg'$ .    Expected Outcomes  Short Term: Participant states understanding of desired cholesterol values and is compliant with medications prescribed. Participant is following exercise prescription and nutrition guidelines.;Long Term: Cholesterol controlled with medications as prescribed, with individualized exercise RX and with personalized nutrition plan. Value goals: LDL < '70mg'$ , HDL > 40 mg.       Core Components/Risk Factors/Patient Goals Review:  Goals and Risk Factor Review    Row Name 06/13/19 1110 07/11/19 1102           Core Components/Risk Factors/Patient Goals Review   Personal Goals Review  Weight Management/Obesity;Diabetes;Hypertension  -      Review  GaKingdoms doing well in rehab. His weight is staying steady thanks to a good diet. He has gotten away from fried foods altogether and doesn't miss them.  His blood sugars have gotten better with the improved diet. He checks them about once a week at home and more if it feels high.  Pressures have been good generally.  He had a high day on Tuesday, but much better yesterday.  He had [place] leg yesterday and talked to doctor about it and flow is at 105%.  GaJavintill has chest pain with exercise but it is improving.  GaEmillioaw his Dr last week and he is on a 24 hour nitro - his Dr says his work in therapy helped him improve so he could take the long acting medicine.  GaMauryas taken less regular nitro since starting the long acting.  BG has still been good.      Expected Outcomes  Short; Continue to work  on weight loss.  Long: Continue to monitor risk factors.  Short - continue current  deitary changes Long - maintain heart healthy lifestyle         Core Components/Risk Factors/Patient Goals at Discharge (Final Review):  Goals and Risk Factor Review - 07/11/19 1102      Core Components/Risk Factors/Patient Goals Review   Review  Coen saw his Dr last week and he is on a 24 hour nitro - his Dr says his work in therapy helped him improve so he could take the long acting medicine.  Marke has taken less regular nitro since starting the long acting.  BG has still been good.    Expected Outcomes  Short - continue current deitary changes Long - maintain heart healthy lifestyle       ITP Comments: ITP Comments    Row Name 03/27/19 1100 04/15/19 1304 04/30/19 1335 05/27/19 1336 05/28/19 1400   ITP Comments  Initial Consent and Intake completed for entry into the Virtual Home Based Program. Will also use [employer] App. Appts made for EP and RD.   Completed initial ExRx created and sent to Dr. Emily Filbert, Medical Director to review and sign.  Completed Virtual Visit.  General is still having chest pain and BP issues.  He has a follow up appt tomorrow and will call afterwards with an update.  See progress notes for 04/30/19.  Marcoantonio called and has been cleared to start rehab. He is down to 3-4 NTG a day versus 8-9.  His doctor would like for him to go ahead and get started with the program.  Virtual Orientation to program completed today.  Documentation of diagnosis can be found in [facility] 4/15   Row Name 05/31/19 1222 06/06/19 1137 06/26/19 0545 07/24/19 1337     ITP Comments  Completed 6MWT and gym orientation. Initial ITP created and sent for review to Dr. Emily Filbert, Medical Director.  Continues to experience angina symptoms. Has been able to stop, rest and symptoms resolve.  30 Day review. Continue with ITP unless directed changes per Medical Director review.  30 day review completed. ITP sent to Dr. Emily Filbert, Medical Director of Cardiac and Pulmonary Rehab. Continue with ITP unless changes are made by physician.  Department closed starting 10/2 until further notice by infection prevention and Health at Work teams for [place].       Comments: 30 day review

## 2019-07-25 DIAGNOSIS — I2510 Atherosclerotic heart disease of native coronary artery without angina pectoris: Secondary | ICD-10-CM | POA: Diagnosis not present

## 2019-07-25 DIAGNOSIS — M7989 Other specified soft tissue disorders: Secondary | ICD-10-CM | POA: Diagnosis not present

## 2019-07-25 DIAGNOSIS — K219 Gastro-esophageal reflux disease without esophagitis: Secondary | ICD-10-CM | POA: Diagnosis not present

## 2019-07-25 DIAGNOSIS — I5023 Acute on chronic systolic (congestive) heart failure: Secondary | ICD-10-CM | POA: Diagnosis not present

## 2019-07-25 DIAGNOSIS — I10 Essential (primary) hypertension: Secondary | ICD-10-CM | POA: Diagnosis not present

## 2019-07-25 DIAGNOSIS — E782 Mixed hyperlipidemia: Secondary | ICD-10-CM | POA: Diagnosis not present

## 2019-07-30 ENCOUNTER — Encounter: Payer: Medicare Other | Admitting: *Deleted

## 2019-07-30 ENCOUNTER — Other Ambulatory Visit: Payer: Self-pay

## 2019-07-30 DIAGNOSIS — Z86711 Personal history of pulmonary embolism: Secondary | ICD-10-CM | POA: Diagnosis not present

## 2019-07-30 DIAGNOSIS — Z7289 Other problems related to lifestyle: Secondary | ICD-10-CM | POA: Diagnosis not present

## 2019-07-30 DIAGNOSIS — Z7901 Long term (current) use of anticoagulants: Secondary | ICD-10-CM | POA: Diagnosis not present

## 2019-07-30 DIAGNOSIS — Z7902 Long term (current) use of antithrombotics/antiplatelets: Secondary | ICD-10-CM | POA: Diagnosis not present

## 2019-07-30 DIAGNOSIS — E1151 Type 2 diabetes mellitus with diabetic peripheral angiopathy without gangrene: Secondary | ICD-10-CM | POA: Diagnosis not present

## 2019-07-30 DIAGNOSIS — I10 Essential (primary) hypertension: Secondary | ICD-10-CM | POA: Diagnosis not present

## 2019-07-30 DIAGNOSIS — Z87891 Personal history of nicotine dependence: Secondary | ICD-10-CM | POA: Diagnosis not present

## 2019-07-30 DIAGNOSIS — Z951 Presence of aortocoronary bypass graft: Secondary | ICD-10-CM | POA: Diagnosis not present

## 2019-07-30 DIAGNOSIS — I214 Non-ST elevation (NSTEMI) myocardial infarction: Secondary | ICD-10-CM

## 2019-07-30 DIAGNOSIS — Z7982 Long term (current) use of aspirin: Secondary | ICD-10-CM | POA: Diagnosis not present

## 2019-07-30 DIAGNOSIS — Z79899 Other long term (current) drug therapy: Secondary | ICD-10-CM | POA: Diagnosis not present

## 2019-07-30 NOTE — Progress Notes (Signed)
Daily Session Note  Patient Details  Name: Damon English MRN: [id number] Date of Birth: 08/13/1951 Referring Provider:     Cardiac Rehab from 05/31/2019 in [facility] Cardiac and Pulmonary Rehab  Referring Provider  Cloretta Ned MD      Encounter Date: 07/30/2019  Check In: Session Check In - 07/30/19 1058      Check-In   Supervising physician immediately available to respond to emergencies  See telemetry face sheet for immediately available ER MD    Location  [facility]-Cardiac & Pulmonary Rehab    Staff Present  Heath Lark, RN, BSN, CCRP;Laureen Owens Shark, BS, RRT, CPFT;Joseph [place]    Virtual Visit  No    Medication changes reported      No    Fall or balance concerns reported     No    Warm-up and Cool-down  Performed on first and last piece of equipment    Resistance Training Performed  Yes    VAD Patient?  No    PAD/SET Patient?  No      Pain Assessment   Currently in Pain?  No/denies          Social History   Tobacco Use  Smoking Status Former Smoker  . Packs/day: 2.00  . Years: 40.00  . Pack years: 80.00  . Quit date: 11/27/2010  . Years since quitting: 8.6  Smokeless Tobacco Never Used  Tobacco Comment   Quit in 2012    Goals Met:  Independence with exercise equipment Exercise tolerated well No report of cardiac concerns or symptoms  Goals Unmet:  Not Applicable  Comments: Pt able to follow exercise prescription today without complaint.  Will continue to monitor for progression.    Dr. Emily Filbert is Medical Director for [place] and [facility].

## 2019-08-01 ENCOUNTER — Other Ambulatory Visit: Payer: Self-pay

## 2019-08-01 ENCOUNTER — Encounter: Payer: Medicare Other | Admitting: *Deleted

## 2019-08-01 DIAGNOSIS — Z7982 Long term (current) use of aspirin: Secondary | ICD-10-CM | POA: Diagnosis not present

## 2019-08-01 DIAGNOSIS — Z951 Presence of aortocoronary bypass graft: Secondary | ICD-10-CM | POA: Diagnosis not present

## 2019-08-01 DIAGNOSIS — E1151 Type 2 diabetes mellitus with diabetic peripheral angiopathy without gangrene: Secondary | ICD-10-CM | POA: Diagnosis not present

## 2019-08-01 DIAGNOSIS — I214 Non-ST elevation (NSTEMI) myocardial infarction: Secondary | ICD-10-CM

## 2019-08-01 DIAGNOSIS — I10 Essential (primary) hypertension: Secondary | ICD-10-CM | POA: Diagnosis not present

## 2019-08-01 DIAGNOSIS — Z79899 Other long term (current) drug therapy: Secondary | ICD-10-CM | POA: Diagnosis not present

## 2019-08-01 DIAGNOSIS — Z87891 Personal history of nicotine dependence: Secondary | ICD-10-CM | POA: Diagnosis not present

## 2019-08-01 DIAGNOSIS — Z7902 Long term (current) use of antithrombotics/antiplatelets: Secondary | ICD-10-CM | POA: Diagnosis not present

## 2019-08-01 DIAGNOSIS — Z7289 Other problems related to lifestyle: Secondary | ICD-10-CM | POA: Diagnosis not present

## 2019-08-01 DIAGNOSIS — Z7901 Long term (current) use of anticoagulants: Secondary | ICD-10-CM | POA: Diagnosis not present

## 2019-08-01 DIAGNOSIS — Z86711 Personal history of pulmonary embolism: Secondary | ICD-10-CM | POA: Diagnosis not present

## 2019-08-01 NOTE — Progress Notes (Signed)
Daily Session Note  Patient Details  Name: Damon English MRN: [id number] Date of Birth: November 18, 1950 Referring Provider:     Cardiac Rehab from 05/31/2019 in [facility] Cardiac and Pulmonary Rehab  Referring Provider  Cloretta Ned MD      Encounter Date: 08/01/2019  Check In: Session Check In - 08/01/19 1054      Check-In   Supervising physician immediately available to respond to emergencies  See telemetry face sheet for immediately available ER MD    Location  [facility]-Cardiac & Pulmonary Rehab    Staff Present  Renita Papa, RN BSN;Joseph [address] [place], [place], [place], CCRP, CCET    Virtual Visit  No    Medication changes reported      No    Fall or balance concerns reported     No    Warm-up and Cool-down  Performed on first and last piece of equipment    Resistance Training Performed  Yes    VAD Patient?  No    PAD/SET Patient?  No      Pain Assessment   Currently in Pain?  No/denies          Social History   Tobacco Use  Smoking Status Former Smoker  . Packs/day: 2.00  . Years: 40.00  . Pack years: 80.00  . Quit date: 11/27/2010  . Years since quitting: 8.6  Smokeless Tobacco Never Used  Tobacco Comment   Quit in 2012    Goals Met:  Independence with exercise equipment Exercise tolerated well No report of cardiac concerns or symptoms Strength training completed today  Goals Unmet:  Not Applicable  Comments: Pt able to follow exercise prescription today without complaint.  Will continue to monitor for progression.    Dr. Emily Filbert is Medical Director for [place] and [facility].

## 2019-08-06 ENCOUNTER — Other Ambulatory Visit: Payer: Self-pay

## 2019-08-06 ENCOUNTER — Encounter: Payer: Medicare Other | Admitting: *Deleted

## 2019-08-06 DIAGNOSIS — Z79899 Other long term (current) drug therapy: Secondary | ICD-10-CM | POA: Diagnosis not present

## 2019-08-06 DIAGNOSIS — Z7289 Other problems related to lifestyle: Secondary | ICD-10-CM | POA: Diagnosis not present

## 2019-08-06 DIAGNOSIS — Z951 Presence of aortocoronary bypass graft: Secondary | ICD-10-CM | POA: Diagnosis not present

## 2019-08-06 DIAGNOSIS — I10 Essential (primary) hypertension: Secondary | ICD-10-CM | POA: Diagnosis not present

## 2019-08-06 DIAGNOSIS — Z7901 Long term (current) use of anticoagulants: Secondary | ICD-10-CM | POA: Diagnosis not present

## 2019-08-06 DIAGNOSIS — Z87891 Personal history of nicotine dependence: Secondary | ICD-10-CM | POA: Diagnosis not present

## 2019-08-06 DIAGNOSIS — I214 Non-ST elevation (NSTEMI) myocardial infarction: Secondary | ICD-10-CM

## 2019-08-06 DIAGNOSIS — Z7902 Long term (current) use of antithrombotics/antiplatelets: Secondary | ICD-10-CM | POA: Diagnosis not present

## 2019-08-06 DIAGNOSIS — Z86711 Personal history of pulmonary embolism: Secondary | ICD-10-CM | POA: Diagnosis not present

## 2019-08-06 DIAGNOSIS — Z7982 Long term (current) use of aspirin: Secondary | ICD-10-CM | POA: Diagnosis not present

## 2019-08-06 DIAGNOSIS — E1151 Type 2 diabetes mellitus with diabetic peripheral angiopathy without gangrene: Secondary | ICD-10-CM | POA: Diagnosis not present

## 2019-08-06 NOTE — Progress Notes (Signed)
Daily Session Note  Patient Details  Name: Damon English MRN: [id number] Date of Birth: [birth year] Referring Provider:     Cardiac Rehab from 05/31/2019 in [facility] Cardiac and Pulmonary Rehab  Referring Provider  Cloretta Ned MD      Encounter Date: 08/06/2019  Check In: Session Check In - 08/06/19 1120      Check-In   Supervising physician immediately available to respond to emergencies  See telemetry face sheet for immediately available ER MD    Location  [facility]-Cardiac & Pulmonary Rehab    Staff Present  Heath Lark, RN, BSN, CCRP;Amanda Sommer, BA, ACSM CEP, Exercise Physiologist;Joseph Hood RCP,RRT,BSRT    Virtual Visit  No    Medication changes reported      No    Fall or balance concerns reported     No    Warm-up and Cool-down  Performed on first and last piece of equipment    Resistance Training Performed  Yes    VAD Patient?  No    PAD/SET Patient?  No      Pain Assessment   Currently in Pain?  No/denies          Social History   Tobacco Use  Smoking Status Former Smoker  . Packs/day: 2.00  . Years: 40.00  . Pack years: 80.00  . Quit date: 11/27/2010  . Years since quitting: 8.6  Smokeless Tobacco Never Used  Tobacco Comment   Quit in 2012    Goals Met:  Independence with exercise equipment Exercise tolerated well No report of cardiac concerns or symptoms  Goals Unmet:  Not Applicable  Comments: Pt able to follow exercise prescription today without complaint.  Will continue to monitor for progression.    Dr. Emily Filbert is Medical Director for [place] and [facility].

## 2019-08-08 ENCOUNTER — Encounter: Payer: Medicare Other | Admitting: *Deleted

## 2019-08-08 ENCOUNTER — Other Ambulatory Visit: Payer: Self-pay

## 2019-08-08 DIAGNOSIS — Z86711 Personal history of pulmonary embolism: Secondary | ICD-10-CM | POA: Diagnosis not present

## 2019-08-08 DIAGNOSIS — Z79899 Other long term (current) drug therapy: Secondary | ICD-10-CM | POA: Diagnosis not present

## 2019-08-08 DIAGNOSIS — Z7902 Long term (current) use of antithrombotics/antiplatelets: Secondary | ICD-10-CM | POA: Diagnosis not present

## 2019-08-08 DIAGNOSIS — Z951 Presence of aortocoronary bypass graft: Secondary | ICD-10-CM | POA: Diagnosis not present

## 2019-08-08 DIAGNOSIS — Z7901 Long term (current) use of anticoagulants: Secondary | ICD-10-CM | POA: Diagnosis not present

## 2019-08-08 DIAGNOSIS — I214 Non-ST elevation (NSTEMI) myocardial infarction: Secondary | ICD-10-CM

## 2019-08-08 DIAGNOSIS — Z87891 Personal history of nicotine dependence: Secondary | ICD-10-CM | POA: Diagnosis not present

## 2019-08-08 DIAGNOSIS — Z7289 Other problems related to lifestyle: Secondary | ICD-10-CM | POA: Diagnosis not present

## 2019-08-08 DIAGNOSIS — I10 Essential (primary) hypertension: Secondary | ICD-10-CM | POA: Diagnosis not present

## 2019-08-08 DIAGNOSIS — L400 Psoriasis vulgaris: Secondary | ICD-10-CM | POA: Diagnosis not present

## 2019-08-08 DIAGNOSIS — Z7982 Long term (current) use of aspirin: Secondary | ICD-10-CM | POA: Diagnosis not present

## 2019-08-08 DIAGNOSIS — Z48817 Encounter for surgical aftercare following surgery on the skin and subcutaneous tissue: Secondary | ICD-10-CM | POA: Diagnosis not present

## 2019-08-08 DIAGNOSIS — E1151 Type 2 diabetes mellitus with diabetic peripheral angiopathy without gangrene: Secondary | ICD-10-CM | POA: Diagnosis not present

## 2019-08-08 NOTE — Progress Notes (Signed)
Daily Session Note  Patient Details  Name: OSMAN CALZADILLA MRN: [id number] Date of Birth: [birth year] Referring Provider:     Cardiac Rehab from 05/31/2019 in [facility] Cardiac and Pulmonary Rehab  Referring Provider  Cloretta Ned MD      Encounter Date: 08/08/2019  Check In: Session Check In - 08/08/19 1144      Check-In   Supervising physician immediately available to respond to emergencies  See telemetry face sheet for immediately available ER MD    Location  [facility]-Cardiac & Pulmonary Rehab    Staff Present  Heath Lark, RN, BSN, CCRP;Jeanna Durrell BS, Exercise Physiologist;Joseph Hood RCP,RRT,BSRT    Virtual Visit  No    Medication changes reported      No    Fall or balance concerns reported     No    Warm-up and Cool-down  Performed on first and last piece of equipment    Resistance Training Performed  Yes    VAD Patient?  No    PAD/SET Patient?  No      Pain Assessment   Currently in Pain?  No/denies          Social History   Tobacco Use  Smoking Status Former Smoker  . Packs/day: 2.00  . Years: 40.00  . Pack years: 80.00  . Quit date: 11/27/2010  . Years since quitting: 8.7  Smokeless Tobacco Never Used  Tobacco Comment   Quit in 2012    Goals Met:  Independence with exercise equipment Exercise tolerated well No report of cardiac concerns or symptoms  Goals Unmet:  Not Applicable  Comments: Pt able to follow exercise prescription today without complaint.  Will continue to monitor for progression.     Dr. Emily Filbert is Medical Director for [place] and [facility].

## 2019-08-13 ENCOUNTER — Other Ambulatory Visit: Payer: Self-pay

## 2019-08-13 ENCOUNTER — Encounter: Payer: Medicare Other | Admitting: *Deleted

## 2019-08-13 DIAGNOSIS — I214 Non-ST elevation (NSTEMI) myocardial infarction: Secondary | ICD-10-CM

## 2019-08-13 DIAGNOSIS — Z7901 Long term (current) use of anticoagulants: Secondary | ICD-10-CM | POA: Diagnosis not present

## 2019-08-13 DIAGNOSIS — Z7982 Long term (current) use of aspirin: Secondary | ICD-10-CM | POA: Diagnosis not present

## 2019-08-13 DIAGNOSIS — Z87891 Personal history of nicotine dependence: Secondary | ICD-10-CM | POA: Diagnosis not present

## 2019-08-13 DIAGNOSIS — Z951 Presence of aortocoronary bypass graft: Secondary | ICD-10-CM

## 2019-08-13 DIAGNOSIS — Z79899 Other long term (current) drug therapy: Secondary | ICD-10-CM | POA: Diagnosis not present

## 2019-08-13 DIAGNOSIS — E1151 Type 2 diabetes mellitus with diabetic peripheral angiopathy without gangrene: Secondary | ICD-10-CM | POA: Diagnosis not present

## 2019-08-13 DIAGNOSIS — Z86711 Personal history of pulmonary embolism: Secondary | ICD-10-CM | POA: Diagnosis not present

## 2019-08-13 DIAGNOSIS — I10 Essential (primary) hypertension: Secondary | ICD-10-CM | POA: Diagnosis not present

## 2019-08-13 DIAGNOSIS — Z7289 Other problems related to lifestyle: Secondary | ICD-10-CM | POA: Diagnosis not present

## 2019-08-13 DIAGNOSIS — Z7902 Long term (current) use of antithrombotics/antiplatelets: Secondary | ICD-10-CM | POA: Diagnosis not present

## 2019-08-13 NOTE — Progress Notes (Signed)
Daily Session Note  Patient Details  Name: DONYAE KILNER MRN: [id number] Date of Birth: Sep 07, 1951 Referring Provider:     Cardiac Rehab from 05/31/2019 in [facility] Cardiac and Pulmonary Rehab  Referring Provider  Cloretta Ned MD      Encounter Date: 08/13/2019  Check In: Session Check In - 08/13/19 1053      Check-In   Supervising physician immediately available to respond to emergencies  See telemetry face sheet for immediately available ER MD    Location  [facility]-Cardiac & Pulmonary Rehab    Staff Present  Nada Maclachlan, BA, ACSM CEP, Exercise Physiologist;Krista Frederico Hamman, RN BSN;Joseph Hood RCP,RRT,BSRT    Virtual Visit  No    Medication changes reported      No    Fall or balance concerns reported     No    Warm-up and Cool-down  Performed on first and last piece of equipment    Resistance Training Performed  Yes    VAD Patient?  No    PAD/SET Patient?  No      Pain Assessment   Currently in Pain?  No/denies          Social History   Tobacco Use  Smoking Status Former Smoker  . Packs/day: 2.00  . Years: 40.00  . Pack years: 80.00  . Quit date: 11/27/2010  . Years since quitting: 8.7  Smokeless Tobacco Never Used  Tobacco Comment   Quit in 2012    Goals Met:  Independence with exercise equipment Exercise tolerated well No report of cardiac concerns or symptoms Strength training completed today  Goals Unmet:  Not Applicable  Comments: Pt able to follow exercise prescription today without complaint.  Will continue to monitor for progression.    Dr. Emily Filbert is Medical Director for [place] and [facility].

## 2019-08-20 ENCOUNTER — Other Ambulatory Visit: Payer: Self-pay

## 2019-08-20 ENCOUNTER — Encounter: Payer: Medicare Other | Attending: Cardiovascular Disease | Admitting: *Deleted

## 2019-08-20 DIAGNOSIS — Z7289 Other problems related to lifestyle: Secondary | ICD-10-CM | POA: Insufficient documentation

## 2019-08-20 DIAGNOSIS — Z7982 Long term (current) use of aspirin: Secondary | ICD-10-CM | POA: Insufficient documentation

## 2019-08-20 DIAGNOSIS — Z87891 Personal history of nicotine dependence: Secondary | ICD-10-CM | POA: Insufficient documentation

## 2019-08-20 DIAGNOSIS — Z79899 Other long term (current) drug therapy: Secondary | ICD-10-CM | POA: Insufficient documentation

## 2019-08-20 DIAGNOSIS — I10 Essential (primary) hypertension: Secondary | ICD-10-CM | POA: Insufficient documentation

## 2019-08-20 DIAGNOSIS — Z7902 Long term (current) use of antithrombotics/antiplatelets: Secondary | ICD-10-CM | POA: Diagnosis not present

## 2019-08-20 DIAGNOSIS — Z7901 Long term (current) use of anticoagulants: Secondary | ICD-10-CM | POA: Insufficient documentation

## 2019-08-20 DIAGNOSIS — E1151 Type 2 diabetes mellitus with diabetic peripheral angiopathy without gangrene: Secondary | ICD-10-CM | POA: Diagnosis not present

## 2019-08-20 DIAGNOSIS — Z951 Presence of aortocoronary bypass graft: Secondary | ICD-10-CM | POA: Diagnosis not present

## 2019-08-20 DIAGNOSIS — I214 Non-ST elevation (NSTEMI) myocardial infarction: Secondary | ICD-10-CM | POA: Insufficient documentation

## 2019-08-20 DIAGNOSIS — Z86711 Personal history of pulmonary embolism: Secondary | ICD-10-CM | POA: Diagnosis not present

## 2019-08-20 NOTE — Progress Notes (Signed)
Daily Session Note  Patient Details  Name: Damon English MRN: [id number] Date of Birth: 08-31-1951 Referring Provider:     Cardiac Rehab from 05/31/2019 in [facility] Cardiac and Pulmonary Rehab  Referring Provider  Cloretta Ned MD      Encounter Date: 08/20/2019  Check In: Session Check In - 08/20/19 1058      Check-In   Supervising physician immediately available to respond to emergencies  See telemetry face sheet for immediately available ER MD    Location  [facility]-Cardiac & Pulmonary Rehab    Staff Present  Heath Lark, RN, BSN, CCRP;Amanda Sommer, BA, ACSM CEP, Exercise Physiologist;Joseph Hood RCP,RRT,BSRT    Virtual Visit  No    Medication changes reported      No    Fall or balance concerns reported     No    Warm-up and Cool-down  Performed on first and last piece of equipment    Resistance Training Performed  Yes    VAD Patient?  No    PAD/SET Patient?  No      Pain Assessment   Currently in Pain?  No/denies          Social History   Tobacco Use  Smoking Status Former Smoker  . Packs/day: 2.00  . Years: 40.00  . Pack years: 80.00  . Quit date: 11/27/2010  . Years since quitting: 8.7  Smokeless Tobacco Never Used  Tobacco Comment   Quit in 2012    Goals Met:  Independence with exercise equipment Exercise tolerated well No report of cardiac concerns or symptoms  Goals Unmet:  Not Applicable  Comments: Pt able to follow exercise prescription today without complaint.  Will continue to monitor for progression.    Dr. Emily Filbert is Medical Director for [place] and [facility].

## 2019-08-21 ENCOUNTER — Encounter: Payer: Self-pay | Admitting: *Deleted

## 2019-08-21 DIAGNOSIS — Z951 Presence of aortocoronary bypass graft: Secondary | ICD-10-CM

## 2019-08-21 DIAGNOSIS — I214 Non-ST elevation (NSTEMI) myocardial infarction: Secondary | ICD-10-CM

## 2019-08-21 NOTE — Progress Notes (Signed)
Cardiac Individual Treatment Plan  Patient Details  Name: ANDREN BETHEA MRN: [id number] Date of Birth: [birth year] Referring Provider:     Cardiac Rehab from 05/31/2019 in [facility] Cardiac and Pulmonary Rehab  Referring Provider  Cloretta Ned MD      Initial Encounter Date:    Cardiac Rehab from 05/31/2019 in [facility] Cardiac and Pulmonary Rehab  Date  05/31/19      Visit Diagnosis: NSTEMI (non-ST elevation myocardial infarction) ([place])  S/P CABG x 3  Patient's Home Medications on Admission:  Current Outpatient Medications:  .  apixaban (ELIQUIS) 5 MG TABS tablet, Take by mouth., Disp: , Rfl:  .  aspirin 81 MG chewable tablet, Chew by mouth., Disp: , Rfl:  .  atorvastatin (LIPITOR) 80 MG tablet, Take by mouth., Disp: , Rfl:  .  clonazePAM (KLONOPIN) 0.5 MG tablet, Take 0.25-0.5 mg by mouth 3 (three) times daily as needed for anxiety. , Disp: , Rfl:  .  clopidogrel (PLAVIX) 75 MG tablet, Take 75 mg by mouth daily., Disp: , Rfl:  .  DULoxetine (CYMBALTA) 60 MG capsule, Take 1 capsule (60 mg total) by mouth 2 (two) times daily. (Patient not taking: Reported on 03/27/2019), Disp: 20 capsule, Rfl: 0 .  esomeprazole (NEXIUM) 40 MG capsule, Take 1 capsule by mouth daily., Disp: , Rfl:  .  famotidine (PEPCID) 40 MG tablet, Take 1 tablet by mouth every morning., Disp: , Rfl:  .  HYDROcodone-acetaminophen (NORCO/VICODIN) 5-325 MG tablet, Take 0.5-1 tablets by mouth 2 (two) times daily as needed for pain., Disp: , Rfl:  .  Latanoprost 0.005 % EMUL, Apply to eye., Disp: , Rfl:  .  Magnesium (V-R MAGNESIUM) 250 MG TABS, Take by mouth., Disp: , Rfl:  .  metoprolol succinate (TOPROL-XL) 50 MG 24 hr tablet, Take 50 mg by mouth 2 (two) times a day. Take with or immediately following a meal., Disp: , Rfl:  .  ramipril (ALTACE) 2.5 MG capsule, Take by mouth., Disp: , Rfl:  .  ranolazine (RANEXA) 500 MG 12 hr tablet, Take 500 mg by mouth 2 (two) times daily., Disp: , Rfl:  .  SENNOSIDES PO, Take by mouth.,  Disp: , Rfl:  .  spironolactone (ALDACTONE) 25 MG tablet, Take 25 mg by mouth., Disp: , Rfl:  .  sucralfate (CARAFATE) 1 g tablet, Take 1 tablet by mouth 4 (four) times daily -  before meals and at bedtime., Disp: , Rfl:   Past Medical History: Past Medical History:  Diagnosis Date  . Alcohol use    "cutting back" but still heavy and daily, multiple shots of bourbon each night  . Diabetes mellitus without complication ([place])   . History of tobacco use    reportedly quit in 2013  . Hypertension   . Pulmonary embolism ([place]) 2012   unprovoked  . PVD (peripheral vascular disease) ([place])     Tobacco Use: Social History   Tobacco Use  Smoking Status Former Smoker  . Packs/day: 2.00  . Years: 40.00  . Pack years: 80.00  . Quit date: 11/27/2010  . Years since quitting: 8.7  Smokeless Tobacco Never Used  Tobacco Comment   Quit in 2012    Labs: Recent Review Flowsheet Data    Labs for ITP Cardiac and Pulmonary Rehab Latest Ref Rng & Units 01/28/2019 01/29/2019   Cholestrol 0 - 200 mg/dL 168 143   LDLCALC 0 - 99 mg/dL 113(H) 93   HDL >40 mg/dL 36(L) 33(L)   Trlycerides <150 mg/dL 97  87       Exercise Target Goals: Exercise Program Goal: Individual exercise prescription set using results from initial 6 min walk test and THRR while considering  patient's activity barriers and safety.   Exercise Prescription Goal: Initial exercise prescription builds to 30-45 minutes a day of aerobic activity, 2-3 days per week.  Home exercise guidelines will be given to patient during program as part of exercise prescription that the participant will acknowledge.  Activity Barriers & Risk Stratification: Activity Barriers & Cardiac Risk Stratification - 05/31/19 1238      Activity Barriers & Cardiac Risk Stratification   Activity Barriers  Muscular Weakness;Deconditioning;Back Problems;Other (comment);Chest Pain/Angina;Shortness of Breath;Assistive Device;Balance Concerns;Decreased Ventricular  Function    Comments  R leg numb/pain post-op, Hx back surgery, PAD, r foot numb, uses walker on occasion    Cardiac Risk Stratification  High       6 Minute Walk: 6 Minute Walk    Row Name 05/31/19 1234         6 Minute Walk   Phase  Initial     Distance  370 feet     Walk Time  3.97 minutes     # of Rest Breaks  4 15 sec, 15 sec, 20 sec, stopped at 4:48     MPH  1.06     METS  1.91     RPE  13     Perceived Dyspnea   1     VO2 Peak  6.67     Symptoms  Yes (comment)     Comments  fatigue, SOB, R hip/back support, chest pain 5/10     Resting HR  100 bpm     Resting BP  142/70     Resting Oxygen Saturation   98 %     Exercise Oxygen Saturation  during 6 min walk  96 %     Max Ex. HR  134 bpm     Max Ex. BP  152/74     2 Minute Post BP  146/64        Oxygen Initial Assessment:   Oxygen Re-Evaluation:   Oxygen Discharge (Final Oxygen Re-Evaluation):   Initial Exercise Prescription: Initial Exercise Prescription - 05/31/19 1200      Date of Initial Exercise RX and Referring Provider   Date  05/31/19    Referring Provider  Cloretta Ned MD      Treadmill   MPH  0.8    Grade  0    Minutes  15    METs  1.6      NuStep   Level  1    SPM  80    Minutes  15    METs  1.5      REL-XR   Level  1    Speed  50    Minutes  15    METs  1.5      T5 Nustep   Level  1    SPM  80    Minutes  15    METs  1.5      Prescription Details   Frequency (times per week)  2    Duration  Progress to 30 minutes of continuous aerobic without signs/symptoms of physical distress      Intensity   THRR 40-80% of Max Heartrate  122-143    Ratings of Perceived Exertion  11-13    Perceived Dyspnea  0-4      Progression   Progression  Continue to progress workloads to maintain intensity without signs/symptoms of physical distress.      Resistance Training   Training Prescription  Yes    Weight  3 lbs    Reps  10-15       Perform Capillary Blood Glucose checks as  needed.  Exercise Prescription Changes: Exercise Prescription Changes    Row Name 05/31/19 1200 06/07/19 0800 06/11/19 1500 06/26/19 1100 07/09/19 1500     Response to Exercise   Blood Pressure (Admit)  142/70  124/58  148/70  140/76  140/68   Blood Pressure (Exercise)  152/74  148/74  150/80  152/74  152/76   Blood Pressure (Exit)  146/64  120/80  140/80  124/66  114/50   Heart Rate (Admit)  100 bpm  66 bpm  69 bpm  71 bpm  78 bpm   Heart Rate (Exercise)  134 bpm  93 bpm  98 bpm  103 bpm  91 bpm   Heart Rate (Exit)  93 bpm  64 bpm  68 bpm  69 bpm  68 bpm   Oxygen Saturation (Admit)  98 %  -  -  -  -   Oxygen Saturation (Exercise)  96 %  -  -  -  -   Rating of Perceived Exertion (Exercise)  _0 Perceived Dyspnea (Exercise)  1  -  -  -  -   Symptoms  fatigue, SOB, chest pain  -  -  -  none   Comments  walk test results  -  -  -  -   Duration  -  Progress to 30 minutes of  aerobic without signs/symptoms of physical distress  Progress to 30 minutes of  aerobic without signs/symptoms of physical distress  Progress to 30 minutes of  aerobic without signs/symptoms of physical distress  Progress to 30 minutes of  aerobic without signs/symptoms of physical distress   Intensity  -  THRR unchanged  THRR unchanged  THRR unchanged  THRR unchanged     Progression   Progression  -  Continue to progress workloads to maintain intensity without signs/symptoms of physical distress.  Continue to progress workloads to maintain intensity without signs/symptoms of physical distress.  Continue to progress workloads to maintain intensity without signs/symptoms of physical distress.  Continue to progress workloads to maintain intensity without signs/symptoms of physical distress.   Average METs  -  1.9  1.8  1.8  1.6     Resistance Training   Training Prescription  -  Yes  Yes  Yes  Yes   Weight  -  2 lb per session detail sheet  3 lb  3 lb  3 lbs   Reps  -  10-15  10-15  10-15  10-15      Interval Training   Interval Training  -  No  No  No  - n     NuStep   Level  -  1  -  -  1   SPM  -  80  -  -  -   Minutes  -  15  -  -  30   METs  -  1.9  -  -  1.6     T5 Nustep   Level  -  -  -  1  -   SPM  -  -  -  80  -   Minutes  -  -  -  15  -   METs  -  -  -  1.8  -     Home Exercise Plan   Plans to continue exercise at  -  -  -  -  Home (comment) walking, staff videos   Frequency  -  -  -  -  Add 2 additional days to program exercise sessions.   Initial Home Exercises Provided  -  -  -  -  06/13/19   Row Name 07/30/19 0900 08/07/19 1300           Response to Exercise   Blood Pressure (Admit)  118/58  140/76      Blood Pressure (Exercise)  146/72  140/62      Blood Pressure (Exit)  146/74  120/68      Heart Rate (Admit)  87 bpm  96 bpm      Heart Rate (Exercise)  108 bpm  110 bpm      Heart Rate (Exit)  79 bpm  81 bpm      Rating of Perceived Exertion (Exercise)  13  13      Symptoms  none  none      Duration  Continue with 30 min of aerobic exercise without signs/symptoms of physical distress.  Continue with 30 min of aerobic exercise without signs/symptoms of physical distress.      Intensity  THRR unchanged  THRR unchanged        Progression   Progression  Continue to progress workloads to maintain intensity without signs/symptoms of physical distress.  Continue to progress workloads to maintain intensity without signs/symptoms of physical distress.      Average METs  1.95  1.8        Resistance Training   Training Prescription  Yes  Yes      Weight  3 lbs  3 lb      Reps  10-15  10-15        Interval Training   Interval Training  No  -        NuStep   Level  3  -      Minutes  30  -      METs  2  -        REL-XR   Level  -  4      Speed  -  50      Minutes  -  15      METs  -  1.9        T5 Nustep   Level  -  3      SPM  -  80      Minutes  -  15      METs  -  1.8        Home Exercise Plan   Plans to continue exercise at  Home (comment)  walking, staff videos  Home (comment) walking, staff videos      Frequency  Add 2 additional days to program exercise sessions.  Add 2 additional days to program exercise sessions.      Initial Home Exercises Provided  06/13/19  06/13/19         Exercise Comments: Exercise Comments    Row Name 06/06/19 1137 07/24/19 1337         Exercise Comments  Continues to experience angina symptoms. Has been able to stop, rest and symptoms resolve.  Resent links for YouTube videos in order to follow exercise prescription at  home.         Exercise Goals and Review: Exercise Goals    Row Name 05/31/19 1241             Exercise Goals   Increase Physical Activity  Yes       Intervention  Provide advice, education, support and counseling about physical activity/exercise needs.;Develop an individualized exercise prescription for aerobic and resistive training based on initial evaluation findings, risk stratification, comorbidities and participant's personal goals.       Expected Outcomes  Short Term: Attend rehab on a regular basis to increase amount of physical activity.;Long Term: Add in home exercise to make exercise part of routine and to increase amount of physical activity.;Long Term: Exercising regularly at least 3-5 days a week.       Increase Strength and Stamina  Yes       Intervention  Provide advice, education, support and counseling about physical activity/exercise needs.;Develop an individualized exercise prescription for aerobic and resistive training based on initial evaluation findings, risk stratification, comorbidities and participant's personal goals.       Expected Outcomes  Short Term: Increase workloads from initial exercise prescription for resistance, speed, and METs.;Short Term: Perform resistance training exercises routinely during rehab and add in resistance training at home;Long Term: Improve cardiorespiratory fitness, muscular endurance and strength as measured by increased  METs and functional capacity (6MWT)       Able to understand and use rate of perceived exertion (RPE) scale  Yes       Intervention  Provide education and explanation on how to use RPE scale       Expected Outcomes  Short Term: Able to use RPE daily in rehab to express subjective intensity level;Long Term:  Able to use RPE to guide intensity level when exercising independently       Able to understand and use Dyspnea scale  Yes       Intervention  Provide education and explanation on how to use Dyspnea scale       Expected Outcomes  Short Term: Able to use Dyspnea scale daily in rehab to express subjective sense of shortness of breath during exertion;Long Term: Able to use Dyspnea scale to guide intensity level when exercising independently       Knowledge and understanding of Target Heart Rate Range (THRR)  Yes       Intervention  Provide education and explanation of THRR including how the numbers were predicted and where they are located for reference       Expected Outcomes  Short Term: Able to state/look up THRR;Short Term: Able to use daily as guideline for intensity in rehab;Long Term: Able to use THRR to govern intensity when exercising independently       Able to check pulse independently  Yes       Intervention  Provide education and demonstration on how to check pulse in carotid and radial arteries.;Review the importance of being able to check your own pulse for safety during independent exercise       Expected Outcomes  Short Term: Able to explain why pulse checking is important during independent exercise;Long Term: Able to check pulse independently and accurately       Understanding of Exercise Prescription  Yes       Intervention  Provide education, explanation, and written materials on patient's individual exercise prescription       Expected Outcomes  Short Term: Able to explain program exercise prescription;Long Term: Able to explain  home exercise prescription to exercise  independently          Exercise Goals Re-Evaluation : Exercise Goals Re-Evaluation    Row Name 04/30/19 1340 06/11/19 1549 06/13/19 1107 06/26/19 1139 07/09/19 1528     Exercise Goal Re-Evaluation   Exercise Goals Review  Increase Physical Activity;Increase Strength and Stamina;Understanding of Exercise Prescription  Increase Physical Activity;Increase Strength and Stamina;Able to understand and use rate of perceived exertion (RPE) scale;Knowledge and understanding of Target Heart Rate Range (THRR);Understanding of Exercise Prescription  Increase Physical Activity;Increase Strength and Stamina;Understanding of Exercise Prescription  Increase Physical Activity;Increase Strength and Stamina;Able to understand and use rate of perceived exertion (RPE) scale;Knowledge and understanding of Target Heart Rate Range (THRR);Able to check pulse independently;Understanding of Exercise Prescription  Increase Physical Activity;Increase Strength and Stamina;Understanding of Exercise Prescription   Comments  Mateo has not been able to exercise per doctor's orders due to ongoing angina.  We will continue to follow progression.  Keyron has done well since starting back to HT.  Staff will monitor progress  Jarvin is off to a good start in rehab.  Updated home exercise guidelines. Tilmon plans to continue to walk at home. eviewed THR, pulse, RPE, sign and symptoms, NTG use, and when to call 911 or MD.  Also discussed weather considerations and indoor options.  Pt voiced understanding.  Yakir is doing well on the T5 Nustep.  He has better days than others but does have good effort when he attends.  he continues to rest if he has angina  Jaquawn has been doing well in rehab.  Last week he was having increased chest pain.  He was able to return today and did well.  We will continue to monitor his progress.   Expected Outcomes  Short: Get clearance to exercise.  Long: Cleared to start rehab.  Short : exercise consistently Long - increase  MET level  Short: Start to add exercise in at home.  Long: Continue to attend regularly.  Short - be able to work longer before having to rest Long - reach 30 min of continuous exercise without symptoms  Short: Continue to work toward not having as much chest pain.  Long: Continue to add in exercise at home.   [place]ell Name 07/30/19 2409 08/07/19 1307           Exercise Goal Re-Evaluation   Exercise Goals Review  Increase Physical Activity;Increase Strength and Stamina;Understanding of Exercise Prescription  Increase Physical Activity;Increase Strength and Stamina;Able to understand and use rate of perceived exertion (RPE) scale;Knowledge and understanding of Target Heart Rate Range (THRR);Able to check pulse independently;Understanding of Exercise Prescription      Comments  Tye continues to do well in rehab.  I have sent his wife a video routine for him to work on at home.  He was not quite ready to return this week.  We will continue to encourage him to return to rehab and monitor his progression.  Asar did return to class.  He only did  the T5 as the seat on XR  wasnt comfortable. Staff wil monitor progress.      Expected Outcomes  Short: Return to classes.  Long: Continue to use videos on off days at home.  Short - attend class consistently Long - continue exercise on days not at [facility]         Discharge Exercise Prescription (Final Exercise Prescription Changes): Exercise Prescription Changes - 08/07/19 1300      Response to Exercise  Blood Pressure (Admit)  140/76    Blood Pressure (Exercise)  140/62    Blood Pressure (Exit)  120/68    Heart Rate (Admit)  96 bpm    Heart Rate (Exercise)  110 bpm    Heart Rate (Exit)  81 bpm    Rating of Perceived Exertion (Exercise)  13    Symptoms  none    Duration  Continue with 30 min of aerobic exercise without signs/symptoms of physical distress.    Intensity  THRR unchanged      Progression   Progression  Continue to progress workloads to maintain  intensity without signs/symptoms of physical distress.    Average METs  1.8      Resistance Training   Training Prescription  Yes    Weight  3 lb    Reps  10-15      REL-XR   Level  4    Speed  50    Minutes  15    METs  1.9      T5 Nustep   Level  3    SPM  80    Minutes  15    METs  1.8      Home Exercise Plan   Plans to continue exercise at  Home (comment)   walking, staff videos   Frequency  Add 2 additional days to program exercise sessions.    Initial Home Exercises Provided  06/13/19       Nutrition:  Target Goals: Understanding of nutrition guidelines, daily intake of sodium <1533m, cholesterol <2080m calories 30% from fat and 7% or less from saturated fats, daily to have 5 or more servings of fruits and vegetables.  Biometrics: Pre Biometrics - 05/31/19 1241      Pre Biometrics   Height  5' 6.8" (1.697 m)    Weight  188 lb 8 oz (85.5 kg)    BMI (Calculated)  29.69    Single Leg Stand  1.44 seconds        Nutrition Therapy Plan and Nutrition Goals: Nutrition Therapy & Goals - 04/15/19 1334      Nutrition Therapy   Diet  HH, low Na diet    Protein (specify units)  75g    Fiber  25 grams    Whole Grain Foods  3 servings    Saturated Fats  12 max. grams    Fruits and Vegetables  5 servings/day    Sodium  1.5 grams      Personal Nutrition Goals   Nutrition Goal  ST: switch out 1 cup of lemonade for 1 cup of >/=75% water & </=25% lemonade LT: maintain wt, keep HgA1c down    Comments  1.5-2 y ago 240lbs, now 185lbs (self reported); 206lbs (per chart). HgA1c: 6.1-6.5. T2DM controlled with diet (cut CHOs) B: Non-fat creamer & splenda; lost appetite for coffee in the hospital, now drinks decaf 2cups. B: fruits and/or cereal (cheerios) with fruit and almond milk. L: low CHO beer, (1/2 tu[place]wich w/ a bit of lite mayo and lettuce and tomato) and whole wheat pasta with zoodles and sauce. 2-3x/week will have hamburger (no cheese) on whole wheat bread or  hamburger steak with muschrooms and onions. Pt reports watching sodium, limited fried food (every now and again will have fried chicken), lemonade (no cal, no sugar - splenda instead). D: vegetable soup (beans and lots of leafy greens). Will eat poultry and shrimp and scallops.      Intervention Plan   Intervention  Prescribe, educate and counsel regarding individualized specific dietary modifications aiming towards targeted core components such as weight, hypertension, lipid management, diabetes, heart failure and other comorbidities.;Nutrition handout(s) given to patient.   emailed pt handout   Expected Outcomes  Short Term Goal: Understand basic principles of dietary content, such as calories, fat, sodium, cholesterol and nutrients.;Short Term Goal: A plan has been developed with personal nutrition goals set during dietitian appointment.;Long Term Goal: Adherence to prescribed nutrition plan.       Nutrition Assessments: Nutrition Assessments - 05/29/19 1445      MEDFICTS Scores   Pre Score  12       Nutrition Goals Re-Evaluation: Nutrition Goals Re-Evaluation    Row Name 06/11/19 1111 07/16/19 1120 08/20/19 1140         Goals   Nutrition Goal  ST:  B can add soymilk instead of almondmilk or add hempheart or ground flax LT: maintain wt, keep HgA1c down  ST:/ LT: continue HH eating maintain wt, keep HgA1c down  ST:/ LT: continue HH eating maintain wt, keep HgA1c down     Comment  B: Non-fat creamer & splenda; lost appetite for coffee in the hospital, now drinks decaf 2cups. B: fruits and/or cereal (cheerios) with fruit and almond milk. L: low CHO beer, (1/2 [place] sandwich w/ a bit of lite mayo and lettuce and tomato) and whole wheat pasta with zoodles and sauce. 2-3x/week will have hamburger (no cheese) on whole wheat bread or hamburger steak with muschrooms and onions. Pt reports watching sodium, limited fried food (every now and again will have fried chicken), lemonade (no cal, no sugar  - splenda instead). D: vegetable soup (beans and lots of leafy greens). Will eat poultry and shrimp and scallops. Pt mixing lemonade with water. Pt reports not eating as much as usual due to low appetite from surgery. Disucssed for B can add soymilk instead of almondmilk or add hempheart or ground flax for a nutrition boost (teeth are not great so doesn't eat nuts).  Pt reports doing well, choosing healthier choices when out like fish or hamburger steak with vegetables and sometimes a baked potato, but does eat much of it. Pt reports still eating the same way since we last spoke and that he is doing well. Pt rpeorts not needing anything from the RD at this time.  Continue with current changes     Expected Outcome  ST:  B can add soymilk instead of almondmilk or add hempheart or ground flax LT: maintain wt, keep HgA1c down  ST:/ LT: continue HH eating maintain wt, keep HgA1c down  ST:/ LT: continue HH eating maintain wt, keep HgA1c down        Nutrition Goals Discharge (Final Nutrition Goals Re-Evaluation): Nutrition Goals Re-Evaluation - 08/20/19 1140      Goals   Nutrition Goal  ST:/ LT: continue HH eating maintain wt, keep HgA1c down    Comment  Continue with current changes    Expected Outcome  ST:/ LT: continue HH eating maintain wt, keep HgA1c down       Psychosocial: Target Goals: Acknowledge presence or absence of significant depression and/or stress, maximize coping skills, provide positive support system. Participant is able to verbalize types and ability to use techniques and skills needed for reducing stress and depression.   Initial Review & Psychosocial Screening: Initial Psych Review & Screening - 05/28/19 1343      Initial Review   Current issues with  None Identified   Is taking  Klonopin after several hospitalizations.  Med prescribed after all this and he feels he is doing good     Family Dynamics   Good Support System?  Yes   Wife     Barriers   Psychosocial barriers  to participate in program  There are no identifiable barriers or psychosocial needs.;The patient should benefit from training in stress management and relaxation.      Screening Interventions   Interventions  Encouraged to exercise;Provide feedback about the scores to participant;To provide support and resources with identified psychosocial needs    Expected Outcomes  Short Term goal: Utilizing psychosocial counselor, staff and physician to assist with identification of specific Stressors or current issues interfering with healing process. Setting desired goal for each stressor or current issue identified.;Long Term Goal: Stressors or current issues are controlled or eliminated.;Short Term goal: Identification and review with participant of any Quality of Life or Depression concerns found by scoring the questionnaire.;Long Term goal: The participant improves quality of Life and PHQ9 Scores as seen by post scores and/or verbalization of changes       Quality of Life Scores:  Quality of Life - 05/29/19 1442      Quality of Life   Select  Quality of Life      Quality of Life Scores   Health/Function Pre  8.6 %    Socioeconomic Pre  26.43 %    Psych/Spiritual Pre  15.57 %    Family Pre  25.2 %    GLOBAL Pre  16.44 %      Scores of 19 and below usually indicate a poorer quality of life in these areas.  A difference of  2-3 points is a clinically meaningful difference.  A difference of 2-3 points in the total score of the Quality of Life Index has been associated with significant improvement in overall quality of life, self-image, physical symptoms, and general health in studies assessing change in quality of life.  PHQ-9: Recent Review Flowsheet Data    Depression screen [facility] 2/9 07/02/2019 05/31/2019   Decreased Interest 0 0   Down, Depressed, Hopeless 0 1   PHQ - 2 Score 0 1   Altered sleeping 1 3   Tired, decreased energy 1 3   Change in appetite 0 0   Feeling bad or failure about  yourself  0 0   Trouble concentrating 0 0   Moving slowly or fidgety/restless 1 0   Suicidal thoughts 0 0   PHQ-9 Score 3 7   Difficult doing work/chores Not difficult at all Somewhat difficult     Interpretation of Total Score  Total Score Depression Severity:  1-4 = Minimal depression, 5-9 = Mild depression, 10-14 = Moderate depression, 15-19 = Moderately severe depression, 20-27 = Severe depression   Psychosocial Evaluation and Intervention:   Psychosocial Re-Evaluation: Psychosocial Re-Evaluation    [place] Name 06/13/19 1109 07/11/19 1107 08/01/19 1100         Psychosocial Re-Evaluation   Current issues with  Current Stress Concerns  -  Current Anxiety/Panic     Comments  [place] is doing well in rehab.  He is off to a good start.  He feels good mentally and tries not to let too many things get to him.  His health is his biggest issue.  He sleep pretty well.  Harel wakes up sometimes and cant go back to sleep.  He will talk to his Dr about this.  He does practice slow deep breathing when needed.  Patient states he takes Klonopin every morning to help with his anxiety. He says his anxiety sometimes causes his angina.     Expected Outcomes  Short: Exericse for boost.  Long: Continue to stay positive.  Short - continue to practice stress redution Long - manage stress and sleep  Short: Attend HeartTrack stress management education to decrease stress. Long: Maintain exercise Post HeartTrack to keep stress at a minimum     Interventions  Encouraged to attend Cardiac Rehabilitation for the exercise  -  Encouraged to attend Cardiac Rehabilitation for the exercise     Continue Psychosocial Services   Follow up required by staff  -  Follow up required by staff        Psychosocial Discharge (Final Psychosocial Re-Evaluation): Psychosocial Re-Evaluation - 08/01/19 1100      Psychosocial Re-Evaluation   Current issues with  Current Anxiety/Panic    Comments  Patient states he takes Klonopin every  morning to help with his anxiety. He says his anxiety sometimes causes his angina.    Expected Outcomes  Short: Attend HeartTrack stress management education to decrease stress. Long: Maintain exercise Post HeartTrack to keep stress at a minimum    Interventions  Encouraged to attend Cardiac Rehabilitation for the exercise    Continue Psychosocial Services   Follow up required by staff       Vocational Rehabilitation: Provide vocational rehab assistance to qualifying candidates.   Vocational Rehab Evaluation & Intervention: Vocational Rehab - 05/28/19 1348      Initial Vocational Rehab Evaluation & Intervention   Assessment shows need for Vocational Rehabilitation  No       Education: Education Goals: Education classes will be provided on a variety of topics geared toward better understanding of heart health and risk factor modification. Participant will state understanding/return demonstration of topics presented as noted by education test scores.  Learning Barriers/Preferences:   Education Topics:  AED/CPR: - Group verbal and written instruction with the use of models to demonstrate the basic use of the AED with the basic ABC's of resuscitation.   General Nutrition Guidelines/Fats and Fiber: -Group instruction provided by verbal, written material, models and posters to present the general guidelines for heart healthy nutrition. Gives an explanation and review of dietary fats and fiber.   Controlling Sodium/Reading Food Labels: -Group verbal and written material supporting the discussion of sodium use in heart healthy nutrition. Review and explanation with models, verbal and written materials for utilization of the food label.   Exercise Physiology & General Exercise Guidelines: - Group verbal and written instruction with models to review the exercise physiology of the cardiovascular system and associated critical values. Provides general exercise guidelines with specific  guidelines to those with heart or lung disease.    Aerobic Exercise & Resistance Training: - Gives group verbal and written instruction on the various components of exercise. Focuses on aerobic and resistive training programs and the benefits of this training and how to safely progress through these programs..   Cardiac Rehab from 08/08/2019 in [facility] Cardiac and Pulmonary Rehab  Date  08/08/19  Educator  [facility]  Instruction Review Code  1- Verbalizes Understanding      Flexibility, Balance, Mind/Body Relaxation: Provides group verbal/written instruction on the benefits of flexibility and balance training, including mind/body exercise modes such as yoga, pilates and tai chi.  Demonstration and skill practice provided.   Stress and Anxiety: - Provides group verbal and written instruction about the health risks of elevated stress and causes  of high stress.  Discuss the correlation between heart/lung disease and anxiety and treatment options. Review healthy ways to manage with stress and anxiety.   Depression: - Provides group verbal and written instruction on the correlation between heart/lung disease and depressed mood, treatment options, and the stigmas associated with seeking treatment.   Anatomy & Physiology of the Heart: - Group verbal and written instruction and models provide basic cardiac anatomy and physiology, with the coronary electrical and arterial systems. Review of Valvular disease and Heart Failure   Cardiac Procedures: - Group verbal and written instruction to review commonly prescribed medications for heart disease. Reviews the medication, class of the drug, and side effects. Includes the steps to properly store meds and maintain the prescription regimen. (beta blockers and nitrates)   Cardiac Medications I: - Group verbal and written instruction to review commonly prescribed medications for heart disease. Reviews the medication, class of the drug, and side effects.  Includes the steps to properly store meds and maintain the prescription regimen.   Cardiac Medications II: -Group verbal and written instruction to review commonly prescribed medications for heart disease. Reviews the medication, class of the drug, and side effects. (all other drug classes)   Cardiac Rehab from 08/08/2019 in [facility] Cardiac and Pulmonary Rehab  Date  08/08/19  Educator  [facility]  Instruction Review Code  1- Verbalizes Understanding       Go Sex-Intimacy & Heart Disease, Get SMART - Goal Setting: - Group verbal and written instruction through game format to discuss heart disease and the return to sexual intimacy. Provides group verbal and written material to discuss and apply goal setting through the application of the S.M.A.R.T. Method.   Other Matters of the Heart: - Provides group verbal, written materials and models to describe Stable Angina and Peripheral Artery. Includes description of the disease process and treatment options available to the cardiac patient.   Exercise & Equipment Safety: - Individual verbal instruction and demonstration of equipment use and safety with use of the equipment.   Cardiac Rehab from 05/31/2019 in [facility] Cardiac and Pulmonary Rehab  Date  05/31/19  Educator  [facility]  Instruction Review Code  1- Verbalizes Understanding      Infection Prevention: - Provides verbal and written material to individual with discussion of infection control including proper hand washing and proper equipment cleaning during exercise session.   Cardiac Rehab from 05/31/2019 in [facility] Cardiac and Pulmonary Rehab  Date  05/31/19  Educator  [facility]  Instruction Review Code  1- Verbalizes Understanding      Falls Prevention: - Provides verbal and written material to individual with discussion of falls prevention and safety.   Cardiac Rehab from 05/31/2019 in [facility] Cardiac and Pulmonary Rehab  Date  05/31/19  Educator  [facility]  Instruction Review Code  1- Verbalizes Understanding       Diabetes: - Individual verbal and written instruction to review signs/symptoms of diabetes, desired ranges of glucose level fasting, after meals and with exercise. Acknowledge that pre and post exercise glucose checks will be done for 3 sessions at entry of program.   Know Your Numbers and Risk Factors: -Group verbal and written instruction about important numbers in your health.  Discussion of what are risk factors and how they play a role in the disease process.  Review of Cholesterol, Blood Pressure, Diabetes, and BMI and the role they play in your overall health.   Cardiac Rehab from 08/08/2019 in [facility] Cardiac and Pulmonary Rehab  Date  08/08/19  Educator  [facility]  Instruction Review Code  1- Verbalizes Understanding      Sleep Hygiene: -Provides group verbal and written instruction about how sleep can affect your health.  Define sleep hygiene, discuss sleep cycles and impact of sleep habits. Review good sleep hygiene tips.    Other: -Provides group and verbal instruction on various topics (see comments)   Knowledge Questionnaire Score: Knowledge Questionnaire Score - 05/29/19 1445      Knowledge Questionnaire Score   Pre Score  23/26   Education Focus: Nutrition and Exercise      Core Components/Risk Factors/Patient Goals at Admission: Personal Goals and Risk Factors at Admission - 05/31/19 1242      Core Components/Risk Factors/Patient Goals on Admission    Weight Management  Yes;Weight Loss    Intervention  Weight Management: Develop a combined nutrition and exercise program designed to reach desired caloric intake, while maintaining appropriate intake of nutrient and fiber, sodium and fats, and appropriate energy expenditure required for the weight goal.;Weight Management: Provide education and appropriate resources to help participant work on and attain dietary goals.    Admit Weight  188 lb 8 oz (85.5 kg)    Goal Weight: Short Term  183 lb (83 kg)    Goal Weight: Long  Term  180 lb (81.6 kg)    Expected Outcomes  Short Term: Continue to assess and modify interventions until short term weight is achieved;Long Term: Adherence to nutrition and physical activity/exercise program aimed toward attainment of established weight goal;Weight Loss: Understanding of general recommendations for a balanced deficit meal plan, which promotes 1-2 lb weight loss per week and includes a negative energy balance of [phone] kcal/d;Understanding recommendations for meals to include 15-35% energy as protein, 25-35% energy from fat, 35-60% energy from carbohydrates, less than 246m of dietary cholesterol, 20-35 gm of total fiber daily;Understanding of distribution of calorie intake throughout the day with the consumption of 4-5 meals/snacks    Diabetes  Yes    Intervention  Provide education about signs/symptoms and action to take for hypo/hyperglycemia.;Provide education about proper nutrition, including hydration, and aerobic/resistive exercise prescription along with prescribed medications to achieve blood glucose in normal ranges: Fasting glucose 65-99 mg/dL    Expected Outcomes  Short Term: Participant verbalizes understanding of the signs/symptoms and immediate care of hyper/hypoglycemia, proper foot care and importance of medication, aerobic/resistive exercise and nutrition plan for blood glucose control.;Long Term: Attainment of HbA1C < 7%.    Hypertension  Yes    Intervention  Provide education on lifestyle modifcations including regular physical activity/exercise, weight management, moderate sodium restriction and increased consumption of fresh fruit, vegetables, and low fat dairy, alcohol moderation, and smoking cessation.;Monitor prescription use compliance.    Expected Outcomes  Short Term: Continued assessment and intervention until BP is < 140/966mHG in hypertensive participants. < 130/8067mG in hypertensive participants with diabetes, heart failure or chronic kidney disease.;Long  Term: Maintenance of blood pressure at goal levels.    Lipids  Yes    Intervention  Provide education and support for participant on nutrition & aerobic/resistive exercise along with prescribed medications to achieve LDL <64m69mDL >40mg2m Expected Outcomes  Short Term: Participant states understanding of desired cholesterol values and is compliant with medications prescribed. Participant is following exercise prescription and nutrition guidelines.;Long Term: Cholesterol controlled with medications as prescribed, with individualized exercise RX and with personalized nutrition plan. Value goals: LDL < 64mg,47m > 40 mg.       Core  Components/Risk Factors/Patient Goals Review:  Goals and Risk Factor Review    Row Name 06/13/19 1110 07/11/19 1102 08/01/19 1103         Core Components/Risk Factors/Patient Goals Review   Personal Goals Review  Weight Management/Obesity;Diabetes;Hypertension  -  Weight Management/Obesity;Diabetes;Hypertension     Review  Suede is doing well in rehab. His weight is staying steady thanks to a good diet. He has gotten away from fried foods altogether and doesn't miss them.  His blood sugars have gotten better with the improved diet. He checks them about once a week at home and more if it feels high.  Pressures have been good generally.  He had a high day on Tuesday, but much better yesterday.  He had [place] on leg yesterday and talked to doctor about it and flow is at 105%.  Jatavis still has chest pain with exercise but it is improving.  Daviel saw his Dr last week and he is on a 24 hour nitro - his Dr says his work in therapy helped him improve so he could take the long acting medicine.  Rielly has taken less regular nitro since starting the long acting.  BG has still been good.  Patient checks his sugar once a week since he does not take any medicine for it. He watches what he eats and has not been eating as many oatmeal cookies. He has pretty much stopped eating fast food all  together. His resting blood pressure has been 120's over 60's. He is managing his diabetes with diet and exercise. Fortunato wants his weight to be 185 pounds.     Expected Outcomes  Short; Continue to work on weight loss.  Long: Continue to monitor risk factors.  Short - continue current deitary changes Long - maintain heart healthy lifestyle  Short: lose 5 pounds in two weeks. Long: get down to 185 pounds.        Core Components/Risk Factors/Patient Goals at Discharge (Final Review):  Goals and Risk Factor Review - 08/01/19 1103      Core Components/Risk Factors/Patient Goals Review   Personal Goals Review  Weight Management/Obesity;Diabetes;Hypertension    Review  Patient checks his sugar once a week since he does not take any medicine for it. He watches what he eats and has not been eating as many oatmeal cookies. He has pretty much stopped eating fast food all together. His resting blood pressure has been 120's over 60's. He is managing his diabetes with diet and exercise. Mc wants his weight to be 185 pounds.    Expected Outcomes  Short: lose 5 pounds in two weeks. Long: get down to 185 pounds.       ITP Comments: ITP Comments    Row Name 03/27/19 1100 04/15/19 1304 04/30/19 1335 05/27/19 1336 05/28/19 1400   ITP Comments  Initial Consent and Intake completed for entry into the Virtual Home Based Program. Will also use [employer] App. Appts made for EP and RD.   Completed initial ExRx created and sent to Dr. Emily Filbert, Medical Director to review and sign.  Completed Virtual Visit.  Abhiraj is still having chest pain and BP issues.  He has a follow up appt tomorrow and will call afterwards with an update.  See progress notes for 04/30/19.  Caylen called and has been cleared to start rehab. He is down to 3-4 NTG a day versus 8-9.  His doctor would like for him to go ahead and get started with the program.  Virtual Orientation to program completed today.  Documentation of diagnosis can be found in  [facility] 4/15   Row Name 05/31/19 1222 06/06/19 1137 06/26/19 0545 07/24/19 1337 08/21/19 0611   ITP Comments  Completed 6MWT and gym orientation. Initial ITP created and sent for review to Dr. Emily Filbert, Medical Director.  Continues to experience angina symptoms. Has been able to stop, rest and symptoms resolve.  30 Day review. Continue with ITP unless directed changes per Medical Director review.  30 day review completed. ITP sent to Dr. Emily Filbert, Medical Director of Cardiac and Pulmonary Rehab. Continue with ITP unless changes are made by physician.  Department closed starting 10/2 until further notice by infection prevention and Health at Work teams for COVID-19.  30 day review completed. Continue with ITP sent to Dr. Emily Filbert, Medical Director of Cardiac and Pulmonary Rehab for review , changes as needed and signature.      Comments:

## 2019-08-22 ENCOUNTER — Encounter: Payer: Medicare Other | Admitting: *Deleted

## 2019-08-22 ENCOUNTER — Other Ambulatory Visit: Payer: Self-pay

## 2019-08-22 DIAGNOSIS — E1151 Type 2 diabetes mellitus with diabetic peripheral angiopathy without gangrene: Secondary | ICD-10-CM | POA: Diagnosis not present

## 2019-08-22 DIAGNOSIS — Z7982 Long term (current) use of aspirin: Secondary | ICD-10-CM | POA: Diagnosis not present

## 2019-08-22 DIAGNOSIS — Z951 Presence of aortocoronary bypass graft: Secondary | ICD-10-CM | POA: Diagnosis not present

## 2019-08-22 DIAGNOSIS — Z7901 Long term (current) use of anticoagulants: Secondary | ICD-10-CM | POA: Diagnosis not present

## 2019-08-22 DIAGNOSIS — I10 Essential (primary) hypertension: Secondary | ICD-10-CM | POA: Diagnosis not present

## 2019-08-22 DIAGNOSIS — I214 Non-ST elevation (NSTEMI) myocardial infarction: Secondary | ICD-10-CM

## 2019-08-22 DIAGNOSIS — Z79899 Other long term (current) drug therapy: Secondary | ICD-10-CM | POA: Diagnosis not present

## 2019-08-22 DIAGNOSIS — Z7902 Long term (current) use of antithrombotics/antiplatelets: Secondary | ICD-10-CM | POA: Diagnosis not present

## 2019-08-22 DIAGNOSIS — Z87891 Personal history of nicotine dependence: Secondary | ICD-10-CM | POA: Diagnosis not present

## 2019-08-22 DIAGNOSIS — Z86711 Personal history of pulmonary embolism: Secondary | ICD-10-CM | POA: Diagnosis not present

## 2019-08-22 DIAGNOSIS — Z7289 Other problems related to lifestyle: Secondary | ICD-10-CM | POA: Diagnosis not present

## 2019-08-22 NOTE — Progress Notes (Signed)
Daily Session Note  Patient Details  Name: Damon English MRN: [id number] Date of Birth: [birth year] Referring Provider:     [name] from 05/31/2019 in [facility] and Pulmonary English  Referring Provider  Damon English      Encounter Date: 08/22/2019  Check In: Session Check In - 08/22/19 1103      Check-In   Location  [name]    Staff Present  Damon Lark, RN, BSN, CCRP;Damon English, BA, ACSM CEP, Exercise Physiologist;Damon English BS, Exercise Physiologist    Virtual Visit  No    Medication changes reported      No    Fall or balance concerns reported     No    Warm-up and Cool-down  Performed on first and last piece of equipment    Resistance Training Performed  Yes    VAD Patient?  No    PAD/SET Patient?  No      Pain Assessment   Currently in Pain?  No/denies          Social History   Tobacco Use  Smoking Status Former Smoker  . Packs/day: 2.00  . Years: 40.00  . Pack years: 80.00  . Quit date: 11/27/2010  . Years since quitting: 8.7  Smokeless Tobacco Never Used  Tobacco Comment   Quit in 2012    Goals Met:  Independence with exercise equipment Exercise tolerated well No report of Damon concerns or symptoms  Goals Unmet:  Not Applicable  Comments: Pt able to follow exercise prescription today without complaint.  Will continue to monitor for progression.    Dr. Emily English is Medical Director for [place] and [facility].

## 2019-08-27 ENCOUNTER — Telehealth: Payer: Self-pay

## 2019-08-27 NOTE — Telephone Encounter (Signed)
Damon English states that his foot was in a lot of pain and would not be here this morning to exercise.

## 2019-09-03 ENCOUNTER — Encounter: Payer: Medicare Other | Admitting: *Deleted

## 2019-09-03 ENCOUNTER — Other Ambulatory Visit: Payer: Self-pay

## 2019-09-03 DIAGNOSIS — I214 Non-ST elevation (NSTEMI) myocardial infarction: Secondary | ICD-10-CM

## 2019-09-03 DIAGNOSIS — Z951 Presence of aortocoronary bypass graft: Secondary | ICD-10-CM | POA: Diagnosis not present

## 2019-09-03 DIAGNOSIS — Z7902 Long term (current) use of antithrombotics/antiplatelets: Secondary | ICD-10-CM | POA: Diagnosis not present

## 2019-09-03 DIAGNOSIS — Z86711 Personal history of pulmonary embolism: Secondary | ICD-10-CM | POA: Diagnosis not present

## 2019-09-03 DIAGNOSIS — Z7901 Long term (current) use of anticoagulants: Secondary | ICD-10-CM | POA: Diagnosis not present

## 2019-09-03 DIAGNOSIS — I10 Essential (primary) hypertension: Secondary | ICD-10-CM | POA: Diagnosis not present

## 2019-09-03 DIAGNOSIS — Z87891 Personal history of nicotine dependence: Secondary | ICD-10-CM | POA: Diagnosis not present

## 2019-09-03 DIAGNOSIS — Z7982 Long term (current) use of aspirin: Secondary | ICD-10-CM | POA: Diagnosis not present

## 2019-09-03 DIAGNOSIS — Z7289 Other problems related to lifestyle: Secondary | ICD-10-CM | POA: Diagnosis not present

## 2019-09-03 DIAGNOSIS — Z79899 Other long term (current) drug therapy: Secondary | ICD-10-CM | POA: Diagnosis not present

## 2019-09-03 DIAGNOSIS — E1151 Type 2 diabetes mellitus with diabetic peripheral angiopathy without gangrene: Secondary | ICD-10-CM | POA: Diagnosis not present

## 2019-09-03 NOTE — Progress Notes (Signed)
Daily Session Note  Patient Details  Name: INFANT ZINK MRN: [id number] Date of Birth: Mar 18, 1951 Referring Provider:     Cardiac Rehab from 05/31/2019 in [facility] Cardiac and Pulmonary Rehab  Referring Provider  Cloretta Ned MD      Encounter Date: 09/03/2019  Check In: Session Check In - 09/03/19 1145      Check-In   Supervising physician immediately available to respond to emergencies  See telemetry face sheet for immediately available ER MD    Location  [facility] Pulmonary Rehab    Staff Present  Heath Lark, RN, BSN, CCRP;Amanda Sommer, BA, ACSM CEP, Exercise Physiologist    Virtual Visit  No    Medication changes reported      No    Fall or balance concerns reported     No    Warm-up and Cool-down  Performed on first and last piece of equipment    Resistance Training Performed  Yes    VAD Patient?  No    PAD/SET Patient?  No      Pain Assessment   Currently in Pain?  No/denies          Social History   Tobacco Use  Smoking Status Former Smoker  . Packs/day: 2.00  . Years: 40.00  . Pack years: 80.00  . Quit date: 11/27/2010  . Years since quitting: 8.7  Smokeless Tobacco Never Used  Tobacco Comment   Quit in 2012    Goals Met:  Independence with exercise equipment Exercise tolerated well No report of cardiac concerns or symptoms  Goals Unmet:  Not Applicable  Comments: Pt able to follow exercise prescription today without complaint.  Will continue to monitor for progression.    Dr. Emily Filbert is Medical Director for [place] and [facility].

## 2019-09-05 ENCOUNTER — Encounter: Payer: Medicare Other | Admitting: *Deleted

## 2019-09-05 ENCOUNTER — Other Ambulatory Visit: Payer: Self-pay

## 2019-09-05 DIAGNOSIS — Z79899 Other long term (current) drug therapy: Secondary | ICD-10-CM | POA: Diagnosis not present

## 2019-09-05 DIAGNOSIS — Z7289 Other problems related to lifestyle: Secondary | ICD-10-CM | POA: Diagnosis not present

## 2019-09-05 DIAGNOSIS — E1151 Type 2 diabetes mellitus with diabetic peripheral angiopathy without gangrene: Secondary | ICD-10-CM | POA: Diagnosis not present

## 2019-09-05 DIAGNOSIS — Z7982 Long term (current) use of aspirin: Secondary | ICD-10-CM | POA: Diagnosis not present

## 2019-09-05 DIAGNOSIS — Z951 Presence of aortocoronary bypass graft: Secondary | ICD-10-CM

## 2019-09-05 DIAGNOSIS — I214 Non-ST elevation (NSTEMI) myocardial infarction: Secondary | ICD-10-CM

## 2019-09-05 DIAGNOSIS — Z7901 Long term (current) use of anticoagulants: Secondary | ICD-10-CM | POA: Diagnosis not present

## 2019-09-05 DIAGNOSIS — Z7902 Long term (current) use of antithrombotics/antiplatelets: Secondary | ICD-10-CM | POA: Diagnosis not present

## 2019-09-05 DIAGNOSIS — Z86711 Personal history of pulmonary embolism: Secondary | ICD-10-CM | POA: Diagnosis not present

## 2019-09-05 DIAGNOSIS — Z87891 Personal history of nicotine dependence: Secondary | ICD-10-CM | POA: Diagnosis not present

## 2019-09-05 DIAGNOSIS — I10 Essential (primary) hypertension: Secondary | ICD-10-CM | POA: Diagnosis not present

## 2019-09-05 NOTE — Progress Notes (Signed)
Daily Session Note  Patient Details  Name: Damon English MRN: [id number] Date of Birth: 1951-08-03 Referring Provider:     Cardiac Rehab from 05/31/2019 in [facility] Cardiac and Pulmonary Rehab  Referring Provider  Cloretta Ned MD      Encounter Date: 09/05/2019  Check In: Session Check In - 09/05/19 1100      Check-In   Supervising physician immediately available to respond to emergencies  See telemetry face sheet for immediately available ER MD    Location  [facility]-Cardiac & Pulmonary Rehab    Staff Present  Renita Papa, RN BSN;Laureen Owens Shark, BS, RRT, CPFT;Jeanna Durrell BS, Exercise Physiologist    Virtual Visit  No    Medication changes reported      No    Fall or balance concerns reported     No    Warm-up and Cool-down  Performed on first and last piece of equipment    Resistance Training Performed  Yes    VAD Patient?  No    PAD/SET Patient?  No      Pain Assessment   Currently in Pain?  No/denies          Social History   Tobacco Use  Smoking Status Former Smoker  . Packs/day: 2.00  . Years: 40.00  . Pack years: 80.00  . Quit date: 11/27/2010  . Years since quitting: 8.7  Smokeless Tobacco Never Used  Tobacco Comment   Quit in 2012    Goals Met:  Independence with exercise equipment Exercise tolerated well No report of cardiac concerns or symptoms Strength training completed today  Goals Unmet:  Not Applicable  Comments: Pt able to follow exercise prescription today without complaint.  Will continue to monitor for progression.    Dr. Emily Filbert is Medical Director for [place] and [facility].

## 2019-09-09 DIAGNOSIS — Z23 Encounter for immunization: Secondary | ICD-10-CM | POA: Diagnosis not present

## 2019-09-09 DIAGNOSIS — Z8673 Personal history of transient ischemic attack (TIA), and cerebral infarction without residual deficits: Secondary | ICD-10-CM | POA: Diagnosis not present

## 2019-09-09 DIAGNOSIS — E119 Type 2 diabetes mellitus without complications: Secondary | ICD-10-CM | POA: Diagnosis not present

## 2019-09-09 DIAGNOSIS — I10 Essential (primary) hypertension: Secondary | ICD-10-CM | POA: Diagnosis not present

## 2019-09-09 DIAGNOSIS — R251 Tremor, unspecified: Secondary | ICD-10-CM | POA: Diagnosis not present

## 2019-09-09 DIAGNOSIS — Z125 Encounter for screening for malignant neoplasm of prostate: Secondary | ICD-10-CM | POA: Diagnosis not present

## 2019-09-09 DIAGNOSIS — I2510 Atherosclerotic heart disease of native coronary artery without angina pectoris: Secondary | ICD-10-CM | POA: Diagnosis not present

## 2019-09-17 ENCOUNTER — Other Ambulatory Visit: Payer: Self-pay

## 2019-09-17 ENCOUNTER — Encounter: Payer: Medicare Other | Attending: Cardiovascular Disease | Admitting: *Deleted

## 2019-09-17 DIAGNOSIS — Z7289 Other problems related to lifestyle: Secondary | ICD-10-CM | POA: Insufficient documentation

## 2019-09-17 DIAGNOSIS — Z86711 Personal history of pulmonary embolism: Secondary | ICD-10-CM | POA: Insufficient documentation

## 2019-09-17 DIAGNOSIS — Z7982 Long term (current) use of aspirin: Secondary | ICD-10-CM | POA: Insufficient documentation

## 2019-09-17 DIAGNOSIS — Z7901 Long term (current) use of anticoagulants: Secondary | ICD-10-CM | POA: Insufficient documentation

## 2019-09-17 DIAGNOSIS — E1151 Type 2 diabetes mellitus with diabetic peripheral angiopathy without gangrene: Secondary | ICD-10-CM | POA: Insufficient documentation

## 2019-09-17 DIAGNOSIS — I10 Essential (primary) hypertension: Secondary | ICD-10-CM | POA: Insufficient documentation

## 2019-09-17 DIAGNOSIS — I214 Non-ST elevation (NSTEMI) myocardial infarction: Secondary | ICD-10-CM

## 2019-09-17 DIAGNOSIS — Z87891 Personal history of nicotine dependence: Secondary | ICD-10-CM | POA: Diagnosis not present

## 2019-09-17 DIAGNOSIS — Z951 Presence of aortocoronary bypass graft: Secondary | ICD-10-CM

## 2019-09-17 DIAGNOSIS — Z79899 Other long term (current) drug therapy: Secondary | ICD-10-CM | POA: Insufficient documentation

## 2019-09-17 DIAGNOSIS — Z7902 Long term (current) use of antithrombotics/antiplatelets: Secondary | ICD-10-CM | POA: Insufficient documentation

## 2019-09-17 NOTE — Progress Notes (Signed)
Daily Session Note  Patient Details  Name: Damon English MRN: [id number] Date of Birth: 07-Apr-1951 Referring Provider:     Cardiac Rehab from 05/31/2019 in [facility] Cardiac and Pulmonary Rehab  Referring Provider  Cloretta Ned MD      Encounter Date: 09/17/2019  Check In: Session Check In - 09/17/19 1123      Check-In   Supervising physician immediately available to respond to emergencies  See telemetry face sheet for immediately available ER MD    Location  [facility]-Cardiac & Pulmonary Rehab    Staff Present  Heath Lark, RN, BSN, CCRP;Amanda Sommer, BA, ACSM CEP, Exercise Physiologist;Joseph Hood RCP,RRT,BSRT    Virtual Visit  No    Medication changes reported      No    Fall or balance concerns reported     No    Warm-up and Cool-down  Performed on first and last piece of equipment    Resistance Training Performed  Yes    VAD Patient?  No    PAD/SET Patient?  No      Pain Assessment   Currently in Pain?  No/denies          Social History   Tobacco Use  Smoking Status Former Smoker  . Packs/day: 2.00  . Years: 40.00  . Pack years: 80.00  . Quit date: 11/27/2010  . Years since quitting: 8.8  Smokeless Tobacco Never Used  Tobacco Comment   Quit in 2012    Goals Met:  Independence with exercise equipment Exercise tolerated well No report of cardiac concerns or symptoms  Goals Unmet:  Not Applicable  Comments: Pt able to follow exercise prescription today without complaint.  Will continue to monitor for progression.    Dr. Emily Filbert is Medical Director for [place] and [facility].

## 2019-09-18 ENCOUNTER — Encounter: Payer: Self-pay | Admitting: *Deleted

## 2019-09-18 DIAGNOSIS — I214 Non-ST elevation (NSTEMI) myocardial infarction: Secondary | ICD-10-CM

## 2019-09-18 DIAGNOSIS — Z951 Presence of aortocoronary bypass graft: Secondary | ICD-10-CM

## 2019-09-18 NOTE — Progress Notes (Signed)
Cardiac Individual Treatment Plan  Patient Details  Name: Damon English MRN: [id number] Date of Birth: May 19, 1951 Referring Provider:     Cardiac Rehab from 05/31/2019 in [facility] Cardiac and Pulmonary Rehab  Referring Provider  Cloretta Ned MD      Initial Encounter Date:    Cardiac Rehab from 05/31/2019 in [facility] Cardiac and Pulmonary Rehab  Date  05/31/19      Visit Diagnosis: NSTEMI (non-ST elevation myocardial infarction) ([place])  S/P CABG x 3  Patient's Home Medications on Admission:  Current Outpatient Medications:  .  apixaban (ELIQUIS) 5 MG TABS tablet, Take by mouth., Disp: , Rfl:  .  aspirin 81 MG chewable tablet, Chew by mouth., Disp: , Rfl:  .  atorvastatin (LIPITOR) 80 MG tablet, Take by mouth., Disp: , Rfl:  .  clonazePAM (KLONOPIN) 0.5 MG tablet, Take 0.25-0.5 mg by mouth 3 (three) times daily as needed for anxiety. , Disp: , Rfl:  .  clopidogrel (PLAVIX) 75 MG tablet, Take 75 mg by mouth daily., Disp: , Rfl:  .  DULoxetine (CYMBALTA) 60 MG capsule, Take 1 capsule (60 mg total) by mouth 2 (two) times daily. (Patient not taking: Reported on 03/27/2019), Disp: 20 capsule, Rfl: 0 .  esomeprazole (NEXIUM) 40 MG capsule, Take 1 capsule by mouth daily., Disp: , Rfl:  .  famotidine (PEPCID) 40 MG tablet, Take 1 tablet by mouth every morning., Disp: , Rfl:  .  HYDROcodone-acetaminophen (NORCO/VICODIN) 5-325 MG tablet, Take 0.5-1 tablets by mouth 2 (two) times daily as needed for pain., Disp: , Rfl:  .  Latanoprost 0.005 % EMUL, Apply to eye., Disp: , Rfl:  .  Magnesium (V-R MAGNESIUM) 250 MG TABS, Take by mouth., Disp: , Rfl:  .  metoprolol succinate (TOPROL-XL) 50 MG 24 hr tablet, Take 50 mg by mouth 2 (two) times a day. Take with or immediately following a meal., Disp: , Rfl:  .  ramipril (ALTACE) 2.5 MG capsule, Take by mouth., Disp: , Rfl:  .  ranolazine (RANEXA) 500 MG 12 hr tablet, Take 500 mg by mouth 2 (two) times daily., Disp: , Rfl:  .  SENNOSIDES PO, Take by mouth.,  Disp: , Rfl:  .  spironolactone (ALDACTONE) 25 MG tablet, Take 25 mg by mouth., Disp: , Rfl:  .  sucralfate (CARAFATE) 1 g tablet, Take 1 tablet by mouth 4 (four) times daily -  before meals and at bedtime., Disp: , Rfl:   Past Medical History: Past Medical History:  Diagnosis Date  . Alcohol use    "cutting back" but still heavy and daily, multiple shots of bourbon each night  . Diabetes mellitus without complication ([place])   . History of tobacco use    reportedly quit in 2013  . Hypertension   . Pulmonary embolism ([place]) 2012   unprovoked  . PVD (peripheral vascular disease) ([place])     Tobacco Use: Social History   Tobacco Use  Smoking Status Former Smoker  . Packs/day: 2.00  . Years: 40.00  . Pack years: 80.00  . Quit date: 11/27/2010  . Years since quitting: 8.8  Smokeless Tobacco Never Used  Tobacco Comment   Quit in 2012    Labs: Recent Review Flowsheet Data    Labs for ITP Cardiac and Pulmonary Rehab Latest Ref Rng & Units 01/28/2019 01/29/2019   Cholestrol 0 - 200 mg/dL 168 143   LDLCALC 0 - 99 mg/dL 113(H) 93   HDL >40 mg/dL 36(L) 33(L)   Trlycerides <150 mg/dL 97  87       Exercise Target Goals: Exercise Program Goal: Individual exercise prescription set using results from initial 6 min walk test and THRR while considering  patient's activity barriers and safety.   Exercise Prescription Goal: Initial exercise prescription builds to 30-45 minutes a day of aerobic activity, 2-3 days per week.  Home exercise guidelines will be given to patient during program as part of exercise prescription that the participant will acknowledge.  Activity Barriers & Risk Stratification: Activity Barriers & Cardiac Risk Stratification - 05/31/19 1238      Activity Barriers & Cardiac Risk Stratification   Activity Barriers  Muscular Weakness;Deconditioning;Back Problems;Other (comment);Chest Pain/Angina;Shortness of Breath;Assistive Device;Balance Concerns;Decreased Ventricular  Function    Comments  R leg numb/pain post-op, Hx back surgery, PAD, r foot numb, uses walker on occasion    Cardiac Risk Stratification  High       6 Minute Walk: 6 Minute Walk    Row Name 05/31/19 1234         6 Minute Walk   Phase  Initial     Distance  370 feet     Walk Time  3.97 minutes     # of Rest Breaks  4 15 sec, 15 sec, 20 sec, stopped at 4:48     MPH  1.06     METS  1.91     RPE  13     Perceived Dyspnea   1     VO2 Peak  6.67     Symptoms  Yes (comment)     Comments  fatigue, SOB, R hip/back support, chest pain 5/10     Resting HR  100 bpm     Resting BP  142/70     Resting Oxygen Saturation   98 %     Exercise Oxygen Saturation  during 6 min walk  96 %     Max Ex. HR  134 bpm     Max Ex. BP  152/74     2 Minute Post BP  146/64        Oxygen Initial Assessment:   Oxygen Re-Evaluation:   Oxygen Discharge (Final Oxygen Re-Evaluation):   Initial Exercise Prescription: Initial Exercise Prescription - 05/31/19 1200      Date of Initial Exercise RX and Referring Provider   Date  05/31/19    Referring Provider  Cloretta Ned MD      Treadmill   MPH  0.8    Grade  0    Minutes  15    METs  1.6      NuStep   Level  1    SPM  80    Minutes  15    METs  1.5      REL-XR   Level  1    Speed  50    Minutes  15    METs  1.5      T5 Nustep   Level  1    SPM  80    Minutes  15    METs  1.5      Prescription Details   Frequency (times per week)  2    Duration  Progress to 30 minutes of continuous aerobic without signs/symptoms of physical distress      Intensity   THRR 40-80% of Max Heartrate  122-143    Ratings of Perceived Exertion  11-13    Perceived Dyspnea  0-4      Progression   Progression  Continue to progress workloads to maintain intensity without signs/symptoms of physical distress.      Resistance Training   Training Prescription  Yes    Weight  3 lbs    Reps  10-15       Perform Capillary Blood Glucose checks as  needed.  Exercise Prescription Changes: Exercise Prescription Changes    Row Name 05/31/19 1200 06/07/19 0800 06/11/19 1500 06/26/19 1100 07/09/19 1500     Response to Exercise   Blood Pressure (Admit)  142/70  124/58  148/70  140/76  140/68   Blood Pressure (Exercise)  152/74  148/74  150/80  152/74  152/76   Blood Pressure (Exit)  146/64  120/80  140/80  124/66  114/50   Heart Rate (Admit)  100 bpm  66 bpm  69 bpm  71 bpm  78 bpm   Heart Rate (Exercise)  134 bpm  93 bpm  98 bpm  103 bpm  91 bpm   Heart Rate (Exit)  93 bpm  64 bpm  68 bpm  69 bpm  68 bpm   Oxygen Saturation (Admit)  98 %  -  -  -  -   Oxygen Saturation (Exercise)  96 %  -  -  -  -   Rating of Perceived Exertion (Exercise)  _0 Perceived Dyspnea (Exercise)  1  -  -  -  -   Symptoms  fatigue, SOB, chest pain  -  -  -  none   Comments  walk test results  -  -  -  -   Duration  -  Progress to 30 minutes of  aerobic without signs/symptoms of physical distress  Progress to 30 minutes of  aerobic without signs/symptoms of physical distress  Progress to 30 minutes of  aerobic without signs/symptoms of physical distress  Progress to 30 minutes of  aerobic without signs/symptoms of physical distress   Intensity  -  THRR unchanged  THRR unchanged  THRR unchanged  THRR unchanged     Progression   Progression  -  Continue to progress workloads to maintain intensity without signs/symptoms of physical distress.  Continue to progress workloads to maintain intensity without signs/symptoms of physical distress.  Continue to progress workloads to maintain intensity without signs/symptoms of physical distress.  Continue to progress workloads to maintain intensity without signs/symptoms of physical distress.   Average METs  -  1.9  1.8  1.8  1.6     Resistance Training   Training Prescription  -  Yes  Yes  Yes  Yes   Weight  -  2 lb per session detail sheet  3 lb  3 lb  3 lbs   Reps  -  10-15  10-15  10-15  10-15      Interval Training   Interval Training  -  No  No  No  - n     NuStep   Level  -  1  -  -  1   SPM  -  80  -  -  -   Minutes  -  15  -  -  30   METs  -  1.9  -  -  1.6     T5 Nustep   Level  -  -  -  1  -   SPM  -  -  -  80  -   Minutes  -  -  -  15  -   METs  -  -  -  1.8  -     Home Exercise Plan   Plans to continue exercise at  -  -  -  -  Home (comment) walking, staff videos   Frequency  -  -  -  -  Add 2 additional days to program exercise sessions.   Initial Home Exercises Provided  -  -  -  -  06/13/19   Row Name 07/30/19 0900 08/07/19 1300 08/21/19 1000 09/05/19 1300       Response to Exercise   Blood Pressure (Admit)  118/58  140/76  130/54  134/66    Blood Pressure (Exercise)  146/72  140/62  140/60  160/70    Blood Pressure (Exit)  146/74  120/68  112/62  138/64    Heart Rate (Admit)  87 bpm  96 bpm  88 bpm  74 bpm    Heart Rate (Exercise)  108 bpm  110 bpm  99 bpm  100 bpm    Heart Rate (Exit)  79 bpm  81 bpm  87 bpm  70 bpm    Rating of Perceived Exertion (Exercise)  _0 Symptoms  none  none  none  none    Duration  Continue with 30 min of aerobic exercise without signs/symptoms of physical distress.  Continue with 30 min of aerobic exercise without signs/symptoms of physical distress.  Continue with 30 min of aerobic exercise without signs/symptoms of physical distress.  Continue with 30 min of aerobic exercise without signs/symptoms of physical distress.    Intensity  THRR unchanged  THRR unchanged  THRR unchanged  THRR unchanged      Progression   Progression  Continue to progress workloads to maintain intensity without signs/symptoms of physical distress.  Continue to progress workloads to maintain intensity without signs/symptoms of physical distress.  Continue to progress workloads to maintain intensity without signs/symptoms of physical distress.  Continue to progress workloads to maintain intensity without signs/symptoms of physical distress.     Average METs  1.95  1.8  1.7  1.9      Resistance Training   Training Prescription  Yes  Yes  Yes  Yes    Weight  3 lbs  3 lb  3 lbs  3 lb    Reps  10-15  10-15  10-15  10-15      Interval Training   Interval Training  No  -  No  -      NuStep   Level  3  -  3  -    Minutes  30  -  15  -    METs  2  -  1.8  -      REL-XR   Level  -  4  -  -    Speed  -  50  -  -    Minutes  -  15  -  -    METs  -  1.9  -  -      T5 Nustep   Level  -  _1 SPM  -  80  -  80    Minutes  -  _2 METs  -  1.8  1.8  1.9      Home Exercise Plan   Plans to continue exercise at  Home (comment) walking, staff videos  Home (comment) walking, staff videos  Home (comment) walking, staff videos  Home (comment) walking, staff videos    Frequency  Add 2 additional days to program exercise sessions.  Add 2 additional days to program exercise sessions.  Add 2 additional days to program exercise sessions.  Add 2 additional days to program exercise sessions.    Initial Home Exercises Provided  06/13/19  06/13/19  06/13/19  06/13/19       Exercise Comments: Exercise Comments    Row Name 06/06/19 1137 07/24/19 1337         Exercise Comments  Continues to experience angina symptoms. Has been able to stop, rest and symptoms resolve.  Resent links for YouTube videos in order to follow exercise prescription at home.         Exercise Goals and Review: Exercise Goals    Row Name 05/31/19 1241             Exercise Goals   Increase Physical Activity  Yes       Intervention  Provide advice, education, support and counseling about physical activity/exercise needs.;Develop an individualized exercise prescription for aerobic and resistive training based on initial evaluation findings, risk stratification, comorbidities and participant's personal goals.       Expected Outcomes  Short Term: Attend rehab on a regular basis to increase amount of physical activity.;Long Term: Add in home exercise to  make exercise part of routine and to increase amount of physical activity.;Long Term: Exercising regularly at least 3-5 days a week.       Increase Strength and Stamina  Yes       Intervention  Provide advice, education, support and counseling about physical activity/exercise needs.;Develop an individualized exercise prescription for aerobic and resistive training based on initial evaluation findings, risk stratification, comorbidities and participant's personal goals.       Expected Outcomes  Short Term: Increase workloads from initial exercise prescription for resistance, speed, and METs.;Short Term: Perform resistance training exercises routinely during rehab and add in resistance training at home;Long Term: Improve cardiorespiratory fitness, muscular endurance and strength as measured by increased METs and functional capacity (6MWT)       Able to understand and use rate of perceived exertion (RPE) scale  Yes       Intervention  Provide education and explanation on how to use RPE scale       Expected Outcomes  Short Term: Able to use RPE daily in rehab to express subjective intensity level;Long Term:  Able to use RPE to guide intensity level when exercising independently       Able to understand and use Dyspnea scale  Yes       Intervention  Provide education and explanation on how to use Dyspnea scale       Expected Outcomes  Short Term: Able to use Dyspnea scale daily in rehab to express subjective sense of shortness of breath during exertion;Long Term: Able to use Dyspnea scale to guide intensity level when exercising independently       Knowledge and understanding of Target Heart Rate Range (THRR)  Yes       Intervention  Provide education and explanation of THRR including how the numbers were predicted and where they are located for reference       Expected Outcomes  Short Term: Able to state/look up THRR;Short Term: Able to use daily as guideline for intensity in rehab;Long Term: Able to use  THRR to govern  intensity when exercising independently       Able to check pulse independently  Yes       Intervention  Provide education and demonstration on how to check pulse in carotid and radial arteries.;Review the importance of being able to check your own pulse for safety during independent exercise       Expected Outcomes  Short Term: Able to explain why pulse checking is important during independent exercise;Long Term: Able to check pulse independently and accurately       Understanding of Exercise Prescription  Yes       Intervention  Provide education, explanation, and written materials on patient's individual exercise prescription       Expected Outcomes  Short Term: Able to explain program exercise prescription;Long Term: Able to explain home exercise prescription to exercise independently          Exercise Goals Re-Evaluation : Exercise Goals Re-Evaluation    Row Name 04/30/19 1340 06/11/19 1549 06/13/19 1107 06/26/19 1139 07/09/19 1528     Exercise Goal Re-Evaluation   Exercise Goals Review  Increase Physical Activity;Increase Strength and Stamina;Understanding of Exercise Prescription  Increase Physical Activity;Increase Strength and Stamina;Able to understand and use rate of perceived exertion (RPE) scale;Knowledge and understanding of Target Heart Rate Range (THRR);Understanding of Exercise Prescription  Increase Physical Activity;Increase Strength and Stamina;Understanding of Exercise Prescription  Increase Physical Activity;Increase Strength and Stamina;Able to understand and use rate of perceived exertion (RPE) scale;Knowledge and understanding of Target Heart Rate Range (THRR);Able to check pulse independently;Understanding of Exercise Prescription  Increase Physical Activity;Increase Strength and Stamina;Understanding of Exercise Prescription   Comments  Douglas has not been able to exercise per doctor's orders due to ongoing angina.  We will continue to follow progression.  Gatlyn  has done well since starting back to HT.  Staff will monitor progress  Mccade is off to a good start in rehab.  Updated home exercise guidelines. Dorrian plans to continue to walk at home. eviewed THR, pulse, RPE, sign and symptoms, NTG use, and when to call 911 or MD.  Also discussed weather considerations and indoor options.  Pt voiced understanding.  Thayer is doing well on the T5 Nustep.  He has better days than others but does have good effort when he attends.  he continues to rest if he has angina  Eissa has been doing well in rehab.  Last week he was having increased chest pain.  He was able to return today and did well.  We will continue to monitor his progress.   Expected Outcomes  Short: Get clearance to exercise.  Long: Cleared to start rehab.  Short : exercise consistently Long - increase MET level  Short: Start to add exercise in at home.  Long: Continue to attend regularly.  Short - be able to work longer before having to rest Long - reach 30 min of continuous exercise without symptoms  Short: Continue to work toward not having as much chest pain.  Long: Continue to add in exercise at home.   [place] Name 07/30/19 0051 08/07/19 1307 08/21/19 1049 09/05/19 1358       Exercise Goal Re-Evaluation   Exercise Goals Review  Increase Physical Activity;Increase Strength and Stamina;Understanding of Exercise Prescription  Increase Physical Activity;Increase Strength and Stamina;Able to understand and use rate of perceived exertion (RPE) scale;Knowledge and understanding of Target Heart Rate Range (THRR);Able to check pulse independently;Understanding of Exercise Prescription  Increase Physical Activity;Increase Strength and Stamina;Understanding of Exercise Prescription  Increase Physical  Activity;Increase Strength and Stamina;Able to understand and use rate of perceived exertion (RPE) scale;Knowledge and understanding of Target Heart Rate Range (THRR);Able to check pulse independently;Understanding of Exercise  Prescription    Comments  Kyrus continues to do well in rehab.  I have sent his wife a video routine for him to work on at home.  He was not quite ready to return this week.  We will continue to encourage him to return to rehab and monitor his progression.  Tadashi did return to class.  He only did  the T5 as the seat on XR  wasnt comfortable. Staff wil monitor progress.  Orpheus is doing well in rehab.  He continues to stick with the NuSteps.  We will talk about adding in intervals for a challenge.  We will continue to monitor his progress.  Janson continues to do well and is able to exercise without angina.  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    Expected Outcomes  Short: Return to classes.  Long: Continue to use videos on off days at home.  Short - attend class consistently Long - continue exercise on days not at [facility]  Short: Talk about adding in intervals.  Long: Continue to improve stamina.  Short : continue to attend HT Long :  continue to improve stamina       Discharge Exercise Prescription (Final Exercise Prescription Changes): Exercise Prescription Changes - 09/05/19 1300      Response to Exercise   Blood Pressure (Admit)  134/66    Blood Pressure (Exercise)  160/70    Blood Pressure (Exit)  138/64    Heart Rate (Admit)  74 bpm    Heart Rate (Exercise)  100 bpm    Heart Rate (Exit)  70 bpm    Rating of Perceived Exertion (Exercise)  13    Symptoms  none    Duration  Continue with 30 min of aerobic exercise without signs/symptoms of physical distress.    Intensity  THRR unchanged      Progression   Progression  Continue to progress workloads to maintain intensity without signs/symptoms of physical distress.    Average METs  1.9      Resistance Training   Training Prescription  Yes    Weight  3 lb    Reps  10-15      T5 Nustep   Level  3    SPM  80    Minutes  15    METs  1.9      Home Exercise Plan   Plans to continue  exercise at  Home (comment)   walking, staff videos   Frequency  Add 2 additional days to program exercise sessions.    Initial Home Exercises Provided  06/13/19       Nutrition:  Target Goals: Understanding of nutrition guidelines, daily intake of sodium <1550m, cholesterol <2027m calories 30% from fat and 7% or less from saturated fats, daily to have 5 or more servings of fruits and vegetables.  Biometrics: Pre Biometrics - 05/31/19 1241      Pre Biometrics   Height  5' 6.8" (1.697 m)    Weight  188 lb 8 oz (85.5 kg)    BMI (Calculated)  29.69    Single Leg Stand  1.44 seconds        Nutrition Therapy Plan and Nutrition Goals: Nutrition Therapy & Goals - 04/15/19 1334  Nutrition Therapy   Diet  HH, low Na diet    Protein (specify units)  75g    Fiber  25 grams    Whole Grain Foods  3 servings    Saturated Fats  12 max. grams    Fruits and Vegetables  5 servings/day    Sodium  1.5 grams      Personal Nutrition Goals   Nutrition Goal  ST: switch out 1 cup of lemonade for 1 cup of >/=75% water & </=25% lemonade LT: maintain wt, keep HgA1c down    Comments  1.5-2 y ago 240lbs, now 185lbs (self reported); 206lbs (per chart). HgA1c: 6.1-6.5. T2DM controlled with diet (cut CHOs) B: Non-fat creamer & splenda; lost appetite for coffee in the hospital, now drinks decaf 2cups. B: fruits and/or cereal (cheerios) with fruit and almond milk. L: low CHO beer, (1/2 [place] sandwich w/ a bit of lite mayo and lettuce and tomato) and whole wheat pasta with zoodles and sauce. 2-3x/week will have hamburger (no cheese) on whole wheat bread or hamburger steak with muschrooms and onions. Pt reports watching sodium, limited fried food (every now and again will have fried chicken), lemonade (no cal, no sugar - splenda instead). D: vegetable soup (beans and lots of leafy greens). Will eat poultry and shrimp and scallops.      Intervention Plan   Intervention  Prescribe, educate and counsel  regarding individualized specific dietary modifications aiming towards targeted core components such as weight, hypertension, lipid management, diabetes, heart failure and other comorbidities.;Nutrition handout(s) given to patient.   emailed pt handout   Expected Outcomes  Short Term Goal: Understand basic principles of dietary content, such as calories, fat, sodium, cholesterol and nutrients.;Short Term Goal: A plan has been developed with personal nutrition goals set during dietitian appointment.;Long Term Goal: Adherence to prescribed nutrition plan.       Nutrition Assessments: Nutrition Assessments - 05/29/19 1445      MEDFICTS Scores   Pre Score  12       Nutrition Goals Re-Evaluation: Nutrition Goals Re-Evaluation    Row Name 06/11/19 1111 07/16/19 1120 08/20/19 1140 09/17/19 1130       Goals   Nutrition Goal  ST:  B can add soymilk instead of almondmilk or add hempheart or ground flax LT: maintain wt, keep HgA1c down  ST:/ LT: continue HH eating maintain wt, keep HgA1c down  ST:/ LT: continue HH eating maintain wt, keep HgA1c down  ST:/ LT: continue HH eating maintain wt, keep HgA1c down    Comment  B: Non-fat creamer & splenda; lost appetite for coffee in the hospital, now drinks decaf 2cups. B: fruits and/or cereal (cheerios) with fruit and almond milk. L: low CHO beer, (1/2 [place] sandwich w/ a bit of lite mayo and lettuce and tomato) and whole wheat pasta with zoodles and sauce. 2-3x/week will have hamburger (no cheese) on whole wheat bread or hamburger steak with muschrooms and onions. Pt reports watching sodium, limited fried food (every now and again will have fried chicken), lemonade (no cal, no sugar - splenda instead). D: vegetable soup (beans and lots of leafy greens). Will eat poultry and shrimp and scallops. Pt mixing lemonade with water. Pt reports not eating as much as usual due to low appetite from surgery. Disucssed for B can add soymilk instead of almondmilk or add  hempheart or ground flax for a nutrition boost (teeth are not great so doesn't eat nuts).  Pt reports doing well, choosing healthier choices  when out like fish or hamburger steak with vegetables and sometimes a baked potato, but does eat much of it. Pt reports still eating the same way since we last spoke and that he is doing well. Pt rpeorts not needing anything from the RD at this time.  Continue with current changes  Continue with current changes    Expected Outcome  ST:  B can add soymilk instead of almondmilk or add hempheart or ground flax LT: maintain wt, keep HgA1c down  ST:/ LT: continue HH eating maintain wt, keep HgA1c down  ST:/ LT: continue HH eating maintain wt, keep HgA1c down  ST:/ LT: continue HH eating maintain wt, keep HgA1c down       Nutrition Goals Discharge (Final Nutrition Goals Re-Evaluation): Nutrition Goals Re-Evaluation - 09/17/19 1130      Goals   Nutrition Goal  ST:/ LT: continue HH eating maintain wt, keep HgA1c down    Comment  Continue with current changes    Expected Outcome  ST:/ LT: continue HH eating maintain wt, keep HgA1c down       Psychosocial: Target Goals: Acknowledge presence or absence of significant depression and/or stress, maximize coping skills, provide positive support system. Participant is able to verbalize types and ability to use techniques and skills needed for reducing stress and depression.   Initial Review & Psychosocial Screening: Initial Psych Review & Screening - 05/28/19 1343      Initial Review   Current issues with  None Identified   Is taking Klonopin after several hospitalizations.  Med prescribed after all this and he feels he is doing good     Family Dynamics   Good Support System?  Yes   Wife     Barriers   Psychosocial barriers to participate in program  There are no identifiable barriers or psychosocial needs.;The patient should benefit from training in stress management and relaxation.      Screening Interventions    Interventions  Encouraged to exercise;Provide feedback about the scores to participant;To provide support and resources with identified psychosocial needs    Expected Outcomes  Short Term goal: Utilizing psychosocial counselor, staff and physician to assist with identification of specific Stressors or current issues interfering with healing process. Setting desired goal for each stressor or current issue identified.;Long Term Goal: Stressors or current issues are controlled or eliminated.;Short Term goal: Identification and review with participant of any Quality of Life or Depression concerns found by scoring the questionnaire.;Long Term goal: The participant improves quality of Life and PHQ9 Scores as seen by post scores and/or verbalization of changes       Quality of Life Scores:  Quality of Life - 05/29/19 1442      Quality of Life   Select  Quality of Life      Quality of Life Scores   Health/Function Pre  8.6 %    Socioeconomic Pre  26.43 %    Psych/Spiritual Pre  15.57 %    Family Pre  25.2 %    GLOBAL Pre  16.44 %      Scores of 19 and below usually indicate a poorer quality of life in these areas.  A difference of  2-3 points is a clinically meaningful difference.  A difference of 2-3 points in the total score of the Quality of Life Index has been associated with significant improvement in overall quality of life, self-image, physical symptoms, and general health in studies assessing change in quality of life.  PHQ-9: Recent  Review Flowsheet Data    Depression screen [facility] 2/9 07/02/2019 05/31/2019   Decreased Interest 0 0   Down, Depressed, Hopeless 0 1   PHQ - 2 Score 0 1   Altered sleeping 1 3   Tired, decreased energy 1 3   Change in appetite 0 0   Feeling bad or failure about yourself  0 0   Trouble concentrating 0 0   Moving slowly or fidgety/restless 1 0   Suicidal thoughts 0 0   PHQ-9 Score 3 7   Difficult doing work/chores Not difficult at all Somewhat difficult      Interpretation of Total Score  Total Score Depression Severity:  1-4 = Minimal depression, 5-9 = Mild depression, 10-14 = Moderate depression, 15-19 = Moderately severe depression, 20-27 = Severe depression   Psychosocial Evaluation and Intervention:   Psychosocial Re-Evaluation: Psychosocial Re-Evaluation    [place] Name 06/13/19 1109 07/11/19 1107 08/01/19 1100 09/03/19 1129       Psychosocial Re-Evaluation   Current issues with  Current Stress Concerns  -  Current Anxiety/Panic  -    Comments  Taevion is doing well in rehab.  He is off to a good start.  He feels good mentally and tries not to let too many things get to him.  His health is his biggest issue.  He sleep pretty well.  Nidal wakes up sometimes and cant go back to sleep.  He will talk to his Dr about this.  He does practice slow deep breathing when needed.  Patient states he takes Klonopin every morning to help with his anxiety. He says his anxiety sometimes causes his angina.  Pink doesnt feel stressed at this time.  He works part time and enjoys his work.    Expected Outcomes  Short: Exericse for boost.  Long: Continue to stay positive.  Short - continue to practice stress redution Long - manage stress and sleep  Short: Attend HeartTrack stress management education to decrease stress. Long: Maintain exercise Post HeartTrack to keep stress at a minimum  Short - continue to exercise regularly Long - manage stress and anxiety    Interventions  Encouraged to attend Cardiac Rehabilitation for the exercise  -  Encouraged to attend Cardiac Rehabilitation for the exercise  -    Continue Psychosocial Services   Follow up required by staff  -  Follow up required by staff  -       Psychosocial Discharge (Final Psychosocial Re-Evaluation): Psychosocial Re-Evaluation - 09/03/19 1129      Psychosocial Re-Evaluation   Comments  Wilburt doesnt feel stressed at this time.  He works part time and enjoys his work.    Expected Outcomes  Short -  continue to exercise regularly Long - manage stress and anxiety       Vocational Rehabilitation: Provide vocational rehab assistance to qualifying candidates.   Vocational Rehab Evaluation & Intervention: Vocational Rehab - 05/28/19 1348      Initial Vocational Rehab Evaluation & Intervention   Assessment shows need for Vocational Rehabilitation  No       Education: Education Goals: Education classes will be provided on a variety of topics geared toward better understanding of heart health and risk factor modification. Participant will state understanding/return demonstration of topics presented as noted by education test scores.  Learning Barriers/Preferences:   Education Topics:  AED/CPR: - Group verbal and written instruction with the use of models to demonstrate the basic use of the AED with the basic ABC's of  resuscitation.   General Nutrition Guidelines/Fats and Fiber: -Group instruction provided by verbal, written material, models and posters to present the general guidelines for heart healthy nutrition. Gives an explanation and review of dietary fats and fiber.   Cardiac Rehab from 09/05/2019 in [facility] Cardiac and Pulmonary Rehab  Date  09/05/19  Educator  [facility]  Instruction Review Code  1- Verbalizes Understanding      Controlling Sodium/Reading Food Labels: -Group verbal and written material supporting the discussion of sodium use in heart healthy nutrition. Review and explanation with models, verbal and written materials for utilization of the food label.   Exercise Physiology & General Exercise Guidelines: - Group verbal and written instruction with models to review the exercise physiology of the cardiovascular system and associated critical values. Provides general exercise guidelines with specific guidelines to those with heart or lung disease.    Aerobic Exercise & Resistance Training: - Gives group verbal and written instruction on the various components of  exercise. Focuses on aerobic and resistive training programs and the benefits of this training and how to safely progress through these programs..   Cardiac Rehab from 09/05/2019 in [facility] Cardiac and Pulmonary Rehab  Date  08/22/19  Educator  jh  Instruction Review Code  1- Verbalizes Understanding      Flexibility, Balance, Mind/Body Relaxation: Provides group verbal/written instruction on the benefits of flexibility and balance training, including mind/body exercise modes such as yoga, pilates and tai chi.  Demonstration and skill practice provided.   Cardiac Rehab from 09/05/2019 in [facility] Cardiac and Pulmonary Rehab  Date  09/05/19  Educator  AS  Instruction Review Code  1- Verbalizes Understanding      Stress and Anxiety: - Provides group verbal and written instruction about the health risks of elevated stress and causes of high stress.  Discuss the correlation between heart/lung disease and anxiety and treatment options. Review healthy ways to manage with stress and anxiety.   Depression: - Provides group verbal and written instruction on the correlation between heart/lung disease and depressed mood, treatment options, and the stigmas associated with seeking treatment.   Anatomy & Physiology of the Heart: - Group verbal and written instruction and models provide basic cardiac anatomy and physiology, with the coronary electrical and arterial systems. Review of Valvular disease and Heart Failure   Cardiac Procedures: - Group verbal and written instruction to review commonly prescribed medications for heart disease. Reviews the medication, class of the drug, and side effects. Includes the steps to properly store meds and maintain the prescription regimen. (beta blockers and nitrates)   Cardiac Rehab from 09/05/2019 in [facility] Cardiac and Pulmonary Rehab  Date  08/22/19  Educator  mc  Instruction Review Code  1- Verbalizes Understanding      Cardiac Medications I: - Group verbal  and written instruction to review commonly prescribed medications for heart disease. Reviews the medication, class of the drug, and side effects. Includes the steps to properly store meds and maintain the prescription regimen.   Cardiac Medications II: -Group verbal and written instruction to review commonly prescribed medications for heart disease. Reviews the medication, class of the drug, and side effects. (all other drug classes)   Cardiac Rehab from 08/08/2019 in [facility] Cardiac and Pulmonary Rehab  Date  08/08/19  Educator  [facility]  Instruction Review Code  1- Verbalizes Understanding       Go Sex-Intimacy & Heart Disease, Get SMART - Goal Setting: - Group verbal and written instruction through game format to discuss  heart disease and the return to sexual intimacy. Provides group verbal and written material to discuss and apply goal setting through the application of the S.M.A.R.T. Method.   Cardiac Rehab from 09/05/2019 in [facility] Cardiac and Pulmonary Rehab  Date  08/22/19  Educator  mc  Instruction Review Code  1- Verbalizes Understanding      Other Matters of the Heart: - Provides group verbal, written materials and models to describe Stable Angina and Peripheral Artery. Includes description of the disease process and treatment options available to the cardiac patient.   Exercise & Equipment Safety: - Individual verbal instruction and demonstration of equipment use and safety with use of the equipment.   Cardiac Rehab from 05/31/2019 in [facility] Cardiac and Pulmonary Rehab  Date  05/31/19  Educator  [facility]  Instruction Review Code  1- Verbalizes Understanding      Infection Prevention: - Provides verbal and written material to individual with discussion of infection control including proper hand washing and proper equipment cleaning during exercise session.   Cardiac Rehab from 05/31/2019 in [facility] Cardiac and Pulmonary Rehab  Date  05/31/19  Educator  [facility]  Instruction Review Code  1-  Verbalizes Understanding      Falls Prevention: - Provides verbal and written material to individual with discussion of falls prevention and safety.   Cardiac Rehab from 05/31/2019 in [facility] Cardiac and Pulmonary Rehab  Date  05/31/19  Educator  [facility]  Instruction Review Code  1- Verbalizes Understanding      Diabetes: - Individual verbal and written instruction to review signs/symptoms of diabetes, desired ranges of glucose level fasting, after meals and with exercise. Acknowledge that pre and post exercise glucose checks will be done for 3 sessions at entry of program.   Know Your Numbers and Risk Factors: -Group verbal and written instruction about important numbers in your health.  Discussion of what are risk factors and how they play a role in the disease process.  Review of Cholesterol, Blood Pressure, Diabetes, and BMI and the role they play in your overall health.   Cardiac Rehab from 08/08/2019 in [facility] Cardiac and Pulmonary Rehab  Date  08/08/19  Educator  [facility]  Instruction Review Code  1- Verbalizes Understanding      Sleep Hygiene: -Provides group verbal and written instruction about how sleep can affect your health.  Define sleep hygiene, discuss sleep cycles and impact of sleep habits. Review good sleep hygiene tips.    Other: -Provides group and verbal instruction on various topics (see comments)   Knowledge Questionnaire Score: Knowledge Questionnaire Score - 05/29/19 1445      Knowledge Questionnaire Score   Pre Score  23/26   Education Focus: Nutrition and Exercise      Core Components/Risk Factors/Patient Goals at Admission: Personal Goals and Risk Factors at Admission - 05/31/19 1242      Core Components/Risk Factors/Patient Goals on Admission    Weight Management  Yes;Weight Loss    Intervention  Weight Management: Develop a combined nutrition and exercise program designed to reach desired caloric intake, while maintaining appropriate intake of nutrient and  fiber, sodium and fats, and appropriate energy expenditure required for the weight goal.;Weight Management: Provide education and appropriate resources to help participant work on and attain dietary goals.    Admit Weight  188 lb 8 oz (85.5 kg)    Goal Weight: Short Term  183 lb (83 kg)    Goal Weight: Long Term  180 lb (81.6 kg)  Expected Outcomes  Short Term: Continue to assess and modify interventions until short term weight is achieved;Long Term: Adherence to nutrition and physical activity/exercise program aimed toward attainment of established weight goal;Weight Loss: Understanding of general recommendations for a balanced deficit meal plan, which promotes 1-2 lb weight loss per week and includes a negative energy balance of [phone] kcal/d;Understanding recommendations for meals to include 15-35% energy as protein, 25-35% energy from fat, 35-60% energy from carbohydrates, less than 231m of dietary cholesterol, 20-35 gm of total fiber daily;Understanding of distribution of calorie intake throughout the day with the consumption of 4-5 meals/snacks    Diabetes  Yes    Intervention  Provide education about signs/symptoms and action to take for hypo/hyperglycemia.;Provide education about proper nutrition, including hydration, and aerobic/resistive exercise prescription along with prescribed medications to achieve blood glucose in normal ranges: Fasting glucose 65-99 mg/dL    Expected Outcomes  Short Term: Participant verbalizes understanding of the signs/symptoms and immediate care of hyper/hypoglycemia, proper foot care and importance of medication, aerobic/resistive exercise and nutrition plan for blood glucose control.;Long Term: Attainment of HbA1C < 7%.    Hypertension  Yes    Intervention  Provide education on lifestyle modifcations including regular physical activity/exercise, weight management, moderate sodium restriction and increased consumption of fresh fruit, vegetables, and low fat dairy,  alcohol moderation, and smoking cessation.;Monitor prescription use compliance.    Expected Outcomes  Short Term: Continued assessment and intervention until BP is < 140/971mHG in hypertensive participants. < 130/8023mG in hypertensive participants with diabetes, heart failure or chronic kidney disease.;Long Term: Maintenance of blood pressure at goal levels.    Lipids  Yes    Intervention  Provide education and support for participant on nutrition & aerobic/resistive exercise along with prescribed medications to achieve LDL <40m35mDL >40mg85m Expected Outcomes  Short Term: Participant states understanding of desired cholesterol values and is compliant with medications prescribed. Participant is following exercise prescription and nutrition guidelines.;Long Term: Cholesterol controlled with medications as prescribed, with individualized exercise RX and with personalized nutrition plan. Value goals: LDL < 40mg,24m > 40 mg.       Core Components/Risk Factors/Patient Goals Review:  Goals and Risk Factor Review    Row Name 06/13/19 1110 07/11/19 1102 08/01/19 1103 09/03/19 1110       Core Components/Risk Factors/Patient Goals Review   Personal Goals Review  Weight Management/Obesity;Diabetes;Hypertension  -  Weight Management/Obesity;Diabetes;Hypertension  Weight Management/Obesity;Hypertension;Diabetes    Review  Abem iBartoszing well in rehab. His weight is staying steady thanks to a good diet. He has gotten away from fried foods altogether and doesn't miss them.  His blood sugars have gotten better with the improved diet. He checks them about once a week at home and more if it feels high.  Pressures have been good generally.  He had a high day on Tuesday, but much better yesterday.  He had US on [place] yesterday and talked to doctor about it and flow is at 105%.  Corwyn sTimmie has chest pain with exercise but it is improving.  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  Handy hJemaineaken less regular nitro since starting the long acting.  BG has still been good.  Patient checks his sugar once a week since he does not take any medicine for it. He watches  what he eats and has not been eating as many oatmeal cookies. He has pretty much stopped eating fast food all together. His resting blood pressure has been 120's over 60's. He is managing his diabetes with diet and exercise. Lautaro wants his weight to be 185 pounds.  Devesh is taking heart meds as directed - sometimes forgets glaucoma meds.  He has an oatmeal cookie every other day.  His BG has been great.  He usually avoids fried food but had some hushpuppies this weekend.  He has not needed nitro recently.    Expected Outcomes  Short; Continue to work on weight loss.  Long: Continue to monitor risk factors.  Short - continue current deitary changes Long - maintain heart healthy lifestyle  Short: lose 5 pounds in two weeks. Long: get down to 185 pounds.  Short - eat more fruits and vegetables Long - reach goal weight       Core Components/Risk Factors/Patient Goals at Discharge (Final Review):  Goals and Risk Factor Review - 09/03/19 1110      Core Components/Risk Factors/Patient Goals Review   Personal Goals Review  Weight Management/Obesity;Hypertension;Diabetes    Review  Kendon is taking heart meds as directed - sometimes forgets glaucoma meds.  He has an oatmeal cookie every other day.  His BG has been great.  He usually avoids fried food but had some hushpuppies this weekend.  He has not needed nitro recently.    Expected Outcomes  Short - eat more fruits and vegetables Long - reach goal weight       ITP Comments: ITP Comments    Row Name 03/27/19 1100 04/15/19 1304 04/30/19 1335 05/27/19 1336 05/28/19 1400   ITP Comments  Initial Consent and Intake completed for entry into the Virtual Home Based Program. Will also use [employer] App. Appts  made for EP and RD.   Completed initial ExRx created and sent to Dr. Emily Filbert, Medical Director to review and sign.  Completed Virtual Visit.  Alanzo is still having chest pain and BP issues.  He has a follow up appt tomorrow and will call afterwards with an update.  See progress notes for 04/30/19.  Pawel called and has been cleared to start rehab. He is down to 3-4 NTG a day versus 8-9.  His doctor would like for him to go ahead and get started with the program.  Virtual Orientation to program completed today.  Documentation of diagnosis can be found in [facility] 4/15   Row Name 05/31/19 1222 06/06/19 1137 06/26/19 0545 07/24/19 1337 08/21/19 0611   ITP Comments  Completed 6MWT and gym orientation. Initial ITP created and sent for review to Dr. Emily Filbert, Medical Director.  Continues to experience angina symptoms. Has been able to stop, rest and symptoms resolve.  30 Day review. Continue with ITP unless directed changes per Medical Director review.  30 day review completed. ITP sent to Dr. Emily Filbert, Medical Director of Cardiac and Pulmonary Rehab. Continue with ITP unless changes are made by physician.  Department closed starting 10/2 until further notice by infection prevention and Health at Work teams for COVID-19.  30 day review completed. Continue with ITP sent to Dr. Emily Filbert, Medical Director of Cardiac and Pulmonary Rehab for review , changes as needed and signature.   [place] Name 09/18/19 0923           ITP Comments  30 day review competed . ITP sent to Dr Emily Filbert for review, changes as  needed and ITP approval signature.          Comments:

## 2019-09-19 ENCOUNTER — Encounter: Payer: Medicare Other | Admitting: *Deleted

## 2019-09-19 ENCOUNTER — Other Ambulatory Visit: Payer: Self-pay

## 2019-09-19 DIAGNOSIS — E1151 Type 2 diabetes mellitus with diabetic peripheral angiopathy without gangrene: Secondary | ICD-10-CM | POA: Diagnosis not present

## 2019-09-19 DIAGNOSIS — Z79899 Other long term (current) drug therapy: Secondary | ICD-10-CM | POA: Diagnosis not present

## 2019-09-19 DIAGNOSIS — Z7982 Long term (current) use of aspirin: Secondary | ICD-10-CM | POA: Diagnosis not present

## 2019-09-19 DIAGNOSIS — Z87891 Personal history of nicotine dependence: Secondary | ICD-10-CM | POA: Diagnosis not present

## 2019-09-19 DIAGNOSIS — Z7289 Other problems related to lifestyle: Secondary | ICD-10-CM | POA: Diagnosis not present

## 2019-09-19 DIAGNOSIS — Z951 Presence of aortocoronary bypass graft: Secondary | ICD-10-CM | POA: Diagnosis not present

## 2019-09-19 DIAGNOSIS — Z7902 Long term (current) use of antithrombotics/antiplatelets: Secondary | ICD-10-CM | POA: Diagnosis not present

## 2019-09-19 DIAGNOSIS — Z86711 Personal history of pulmonary embolism: Secondary | ICD-10-CM | POA: Diagnosis not present

## 2019-09-19 DIAGNOSIS — Z7901 Long term (current) use of anticoagulants: Secondary | ICD-10-CM | POA: Diagnosis not present

## 2019-09-19 DIAGNOSIS — I10 Essential (primary) hypertension: Secondary | ICD-10-CM | POA: Diagnosis not present

## 2019-09-19 DIAGNOSIS — I214 Non-ST elevation (NSTEMI) myocardial infarction: Secondary | ICD-10-CM | POA: Diagnosis not present

## 2019-09-19 NOTE — Progress Notes (Signed)
Daily Session Note  Patient Details  Name: Damon English MRN: [id number] Date of Birth: 18-Dec-1950 Referring Provider:     Cardiac Rehab from 05/31/2019 in [facility] Cardiac and Pulmonary Rehab  Referring Provider  Cloretta Ned MD      Encounter Date: 09/19/2019  Check In: Session Check In - 09/19/19 1051      Check-In   Supervising physician immediately available to respond to emergencies  See telemetry face sheet for immediately available ER MD    Location  [facility]-Cardiac & Pulmonary Rehab    Staff Present  Renita Papa, RN BSN;Jessica Luan Pulling, MA, RCEP, CCRP, CCET;Jeanna Durrell BS, Exercise Physiologist    Virtual Visit  No    Medication changes reported      No    Fall or balance concerns reported     No    Warm-up and Cool-down  Performed on first and last piece of equipment    Resistance Training Performed  Yes    VAD Patient?  No    PAD/SET Patient?  No      Pain Assessment   Currently in Pain?  No/denies          Social History   Tobacco Use  Smoking Status Former Smoker  . Packs/day: 2.00  . Years: 40.00  . Pack years: 80.00  . Quit date: 11/27/2010  . Years since quitting: 8.8  Smokeless Tobacco Never Used  Tobacco Comment   Quit in 2012    Goals Met:  Independence with exercise equipment Exercise tolerated well No report of cardiac concerns or symptoms Strength training completed today  Goals Unmet:  Not Applicable  Comments: Pt able to follow exercise prescription today without complaint.  Will continue to monitor for progression.    Dr. Emily Filbert is Medical Director for [facility].

## 2019-10-01 ENCOUNTER — Encounter: Payer: Medicare Other | Admitting: *Deleted

## 2019-10-01 ENCOUNTER — Other Ambulatory Visit: Payer: Self-pay

## 2019-10-01 VITALS — Ht 66.8 in | Wt 195.3 lb

## 2019-10-01 DIAGNOSIS — I214 Non-ST elevation (NSTEMI) myocardial infarction: Secondary | ICD-10-CM | POA: Diagnosis not present

## 2019-10-01 DIAGNOSIS — I10 Essential (primary) hypertension: Secondary | ICD-10-CM | POA: Diagnosis not present

## 2019-10-01 DIAGNOSIS — E1151 Type 2 diabetes mellitus with diabetic peripheral angiopathy without gangrene: Secondary | ICD-10-CM | POA: Diagnosis not present

## 2019-10-01 DIAGNOSIS — Z79899 Other long term (current) drug therapy: Secondary | ICD-10-CM | POA: Diagnosis not present

## 2019-10-01 DIAGNOSIS — Z951 Presence of aortocoronary bypass graft: Secondary | ICD-10-CM | POA: Diagnosis not present

## 2019-10-01 DIAGNOSIS — Z7982 Long term (current) use of aspirin: Secondary | ICD-10-CM | POA: Diagnosis not present

## 2019-10-01 DIAGNOSIS — Z86711 Personal history of pulmonary embolism: Secondary | ICD-10-CM | POA: Diagnosis not present

## 2019-10-01 DIAGNOSIS — Z87891 Personal history of nicotine dependence: Secondary | ICD-10-CM | POA: Diagnosis not present

## 2019-10-01 DIAGNOSIS — Z7902 Long term (current) use of antithrombotics/antiplatelets: Secondary | ICD-10-CM | POA: Diagnosis not present

## 2019-10-01 DIAGNOSIS — Z7901 Long term (current) use of anticoagulants: Secondary | ICD-10-CM | POA: Diagnosis not present

## 2019-10-01 DIAGNOSIS — Z7289 Other problems related to lifestyle: Secondary | ICD-10-CM | POA: Diagnosis not present

## 2019-10-01 NOTE — Progress Notes (Signed)
Daily Session Note  Patient Details  Name: ABDIEL BLACKERBY MRN: [id number] Date of Birth: 05/15/1951 Referring Provider:     Cardiac Rehab from 05/31/2019 in [facility] Cardiac and Pulmonary Rehab  Referring Provider  Cloretta Ned MD      Encounter Date: 10/01/2019  Check In: Session Check In - 10/01/19 1144      Check-In   Supervising physician immediately available to respond to emergencies  See telemetry face sheet for immediately available ER MD    Location  [facility]-Cardiac & Pulmonary Rehab    Staff Present  Alberteen Sam, MA, RCEP, CCRP, CCET;Susanne Bice, RN, BSN, CCRP    Virtual Visit  No    Medication changes reported      No    Fall or balance concerns reported     Yes    Comments  fall at home    Warm-up and Cool-down  Performed on first and last piece of equipment    Resistance Training Performed  Yes    VAD Patient?  No    PAD/SET Patient?  No      Pain Assessment   Currently in Pain?  No/denies          Social History   Tobacco Use  Smoking Status Former Smoker  . Packs/day: 2.00  . Years: 40.00  . Pack years: 80.00  . Quit date: 11/27/2010  . Years since quitting: 8.8  Smokeless Tobacco Never Used  Tobacco Comment   Quit in 2012    Goals Met:  Independence with exercise equipment Exercise tolerated well Personal goals reviewed No report of cardiac concerns or symptoms Strength training completed today  Goals Unmet:  Not Applicable  Comments:  Rockne graduated today from  rehab with 24 sessions completed.  Details of the patient's exercise prescription and what He needs to do in order to continue the prescription and progress were discussed with patient.  Patient was given a copy of prescription and goals.  Patient verbalized understanding.  Sims plans to continue to exercise by using chair exercises and PT at home.  Raymond Name 05/31/19 1234 10/01/19 1146       6 Minute Walk   Phase  Initial  Discharge    Distance  370 feet   748 feet    Distance % Change  --  102 %    Distance Feet Change  --  378 ft    Walk Time  3.97 minutes  6 minutes    # of Rest Breaks  4 15 sec, 15 sec, 20 sec, stopped at 4:48  --    MPH  1.06  1.42    METS  1.91  2.27    RPE  13  15    Perceived Dyspnea   1  2    VO2 Peak  6.67  7.96    Symptoms  Yes (comment)  Yes (comment)    Comments  fatigue, SOB, R hip/back support, chest pain 5/10  Fatigue, leg sore, used walker after fall last year    Resting HR  100 bpm  99 bpm    Resting BP  142/70  132/60    Resting Oxygen Saturation   98 %  --    Exercise Oxygen Saturation  during 6 min walk  96 %  --    Max Ex. HR  134 bpm  117 bpm    Max Ex. BP  152/74  148/74    2 Minute  Post BP  146/64  --          Dr. Emily Filbert is Medical Director for [place] and [facility].

## 2019-10-01 NOTE — Progress Notes (Signed)
Discharge Progress Report  Patient Details  Name: Damon English MRN: [id number] Date of Birth: 1951/07/16 Referring Provider:     Cardiac Rehab from 05/31/2019 in [facility] Cardiac [facility] Re[facility]  Referring Provider  Cloretta Ned MD       Number of Visits:27/36 Reason for Discharge:  Patient independent in their exercise.    Smoking History:  Social History   Tobacco Use  Smoking Status Former Smoker  . Packs/day: 2.00  . Years: 40.00  . Pack years: 80.00  . Quit date: 11/27/2010  . Years since quitting: 8.8  Smokeless Tobacco Never Used  Tobacco Comment   Quit in 2012    Diagnosis:  NSTEMI (non-ST elevation myocardial infarction) ([place])  S/P CABG x 3  ADL UCSD:   Initial Exercise Prescription: Initial Exercise Prescription - 05/31/19 1200      Date of Initial Exercise RX and Referring Provider   Date  05/31/19    Referring Provider  Cloretta Ned MD      Treadmill   MPH  0.8    Grade  0    Minutes  15    METs  1.6      NuStep   Level  1    SPM  80    Minutes  15    METs  1.5      REL-XR   Level  1    Speed  50    Minutes  15    METs  1.5      T5 Nustep   Level  1    SPM  80    Minutes  15    METs  1.5      Prescription Details   Frequency (times per week)  2    Duration  Progress to 30 minutes of continuous aerobic without signs/symptoms of physical distress      Intensity   THRR 40-80% of Max Heartrate  122-143    Ratings of Perceived Exertion  11-13    Perceived Dyspnea  0-4      Progression   Progression  Continue to progress workloads to maintain intensity without signs/symptoms of physical distress.      Resistance Training   Training Prescription  Yes    Weight  3 lbs    Reps  10-15       Discharge Exercise Prescription (Final Exercise Prescription Changes): Exercise Prescription Changes - 09/18/19 1300      Response to Exercise   Blood Pressure (Admit)  130/60    Blood Pressure (Exercise)  140/70    Blood  Pressure (Exit)  124/68    Heart Rate (Admit)  83 bpm    Heart Rate (Exercise)  85 bpm    Heart Rate (Exit)  70 bpm    Rating of Perceived Exertion (Exercise)  13    Symptoms  none    Duration  Continue with 30 min of aerobic exercise without signs/symptoms of physical distress.    Intensity  THRR unchanged      Progression   Progression  Continue to progress workloads to maintain intensity without signs/symptoms of physical distress.    Average METs  1.5      Resistance Training   Training Prescription  Yes    Weight  3 lb    Reps  10-15      Interval Training   Interval Training  No      NuStep   Level  5    Minutes  30  METs  1.5      T5 Nustep   Level  3    Minutes  30      Home Exercise Plan   Plans to continue exercise at  Home (comment)   walking, staff videos   Frequency  Add 2 additional days to program exercise sessions.    Initial Home Exercises Provided  06/13/19       Functional Capacity: 6 Minute Walk    Row Name 05/31/19 1234 10/01/19 1146       6 Minute Walk   Phase  Initial  Discharge    Distance  370 feet  748 feet    Distance % Change  -  102 %    Distance Feet Change  -  378 ft    Walk Time  3.97 minutes  6 minutes    # of Rest Breaks  4 15 sec, 15 sec, 20 sec, stopped at 4:48  -    MPH  1.06  1.42    METS  1.91  2.27    RPE  13  15    Perceived Dyspnea   1  2    VO2 Peak  6.67  7.96    Symptoms  Yes (comment)  Yes (comment)    Comments  fatigue, SOB, R hip/back support, chest pain 5/10  Fatigue, leg sore, used walker after fall last year    Resting HR  100 bpm  99 bpm    Resting BP  142/70  132/60    Resting Oxygen Saturation   98 %  -    Exercise Oxygen Saturation  during 6 min walk  96 %  -    Max Ex. HR  134 bpm  117 bpm    Max Ex. BP  152/74  148/74    2 Minute Post BP  146/64  -       Psychological, QOL, Others - Outcomes: PHQ 2/9: Depression screen [facility] 2/9 07/02/2019 05/31/2019  Decreased Interest 0 0  Down, Depressed,  Hopeless 0 1  PHQ - 2 Score 0 1  Altered sleeping 1 3  Tired, decreased energy 1 3  Change in appetite 0 0  Feeling bad or failure about yourself  0 0  Trouble concentrating 0 0  Moving slowly or fidgety/restless 1 0  Suicidal thoughts 0 0  PHQ-9 Score 3 7  Difficult doing work/chores Not difficult at all Somewhat difficult    Quality of Life: Quality of Life - 05/29/19 1442      Quality of Life   Select  Quality of Life      Quality of Life Scores   Health/Function Pre  8.6 %    Socioeconomic Pre  26.43 %    Psych/Spiritual Pre  15.57 %    Family Pre  25.2 %    GLOBAL Pre  16.44 %       Personal Goals: Goals established at orientation with interventions provided to work toward goal. Personal Goals and Risk Factors at Admission - 05/31/19 1242      Core Components/Risk Factors/Patient Goals on Admission    Weight Management  Yes;Weight Loss    Intervention  Weight Management: Develop a combined nutrition and exercise program designed to reach desired caloric intake, while maintaining appropriate intake of nutrient and fiber, sodium and fats, and appropriate energy expenditure required for the weight goal.;Weight Management: Provide education and appropriate resources to help participant work on and attain dietary goals.    Admit  Weight  188 lb 8 oz (85.5 kg)    Goal Weight: Short Term  183 lb (83 kg)    Goal Weight: Long Term  180 lb (81.6 kg)    Expected Outcomes  Short Term: Continue to assess and modify interventions until short term weight is achieved;Long Term: Adherence to nutrition and physical activity/exercise program aimed toward attainment of established weight goal;Weight Loss: Understanding of general recommendations for a balanced deficit meal plan, which promotes 1-2 lb weight loss per week and includes a negative energy balance of [phone] kcal/d;Understanding recommendations for meals to include 15-35% energy as protein, 25-35% energy from fat, 35-60% energy  from carbohydrates, less than 251m of dietary cholesterol, 20-35 gm of total fiber daily;Understanding of distribution of calorie intake throughout the day with the consumption of 4-5 meals/snacks    Diabetes  Yes    Intervention  Provide education about signs/symptoms and action to take for hypo/hyperglycemia.;Provide education about proper nutrition, including hydration, and aerobic/resistive exercise prescription along with prescribed medications to achieve blood glucose in normal ranges: Fasting glucose 65-99 mg/dL    Expected Outcomes  Short Term: Participant verbalizes understanding of the signs/symptoms and immediate care of hyper/hypoglycemia, proper foot care and importance of medication, aerobic/resistive exercise and nutrition plan for blood glucose control.;Long Term: Attainment of HbA1C < 7%.    Hypertension  Yes    Intervention  Provide education on lifestyle modifcations including regular physical activity/exercise, weight management, moderate sodium restriction and increased consumption of fresh fruit, vegetables, and low fat dairy, alcohol moderation, and smoking cessation.;Monitor prescription use compliance.    Expected Outcomes  Short Term: Continued assessment and intervention until BP is < 140/91mHG in hypertensive participants. < 130/8061mG in hypertensive participants with diabetes, heart failure or chronic kidney disease.;Long Term: Maintenance of blood pressure at goal levels.    Lipids  Yes    Intervention  Provide education and support for participant on nutrition & aerobic/resistive exercise along with prescribed medications to achieve LDL <67m65mDL >40mg65m Expected Outcomes  Short Term: Participant states understanding of desired cholesterol values and is compliant with medications prescribed. Participant is following exercise prescription and nutrition guidelines.;Long Term: Cholesterol controlled with medications as prescribed, with individualized exercise RX and with  personalized nutrition plan. Value goals: LDL < 67mg,35m > 40 mg.        Personal Goals Discharge: Goals and Risk Factor Review    Row Name 06/13/19 1110 07/11/19 1102 08/01/19 1103 09/03/19 1110       Core Components/Risk Factors/Patient Goals Review   Personal Goals Review  Weight Management/Obesity;Diabetes;Hypertension  -  Weight Management/Obesity;Diabetes;Hypertension  Weight Management/Obesity;Hypertension;Diabetes    Review  Ethon iKashtening well in rehab. His weight is staying steady thanks to a good diet. He has gotten away from fried foods altogether and doesn't miss them.  His blood sugars have gotten better with the improved diet. He checks them about once a week at home and more if it feels high.  Pressures have been good generally.  He had a high day on Tuesday, but much better yesterday.  He had US on [place] yesterday and talked to doctor about it and flow is at 105%.  Uday sCoston has chest pain with exercise but it is improving.  Saeed sCorbynis Dr last week and he is on a 24 hour nitro - his Dr says his work in therapy helped him improve so he could take the long acting medicine.  Amos hKhaliqaken  less regular nitro since starting the long acting.  BG has still been good.  Patient checks his sugar once a week since he does not take any medicine for it. He watches what he eats and has not been eating as many oatmeal cookies. He has pretty much stopped eating fast food all together. His resting blood pressure has been 120's over 60's. He is managing his diabetes with diet and exercise. Ceejay wants his weight to be 185 pounds.  Quintavis is taking heart meds as directed - sometimes forgets glaucoma meds.  He has an oatmeal cookie every other day.  His BG has been great.  He usually avoids fried food but had some hushpuppies this weekend.  He has not needed nitro recently.    Expected Outcomes  Short; Continue to work on weight loss.  Long: Continue to monitor risk factors.  Short - continue current deitary  changes Long - maintain heart healthy lifestyle  Short: lose 5 pounds in two weeks. Long: get down to 185 pounds.  Short - eat more fruits and vegetables Long - reach goal weight       Exercise Goals and Review: Exercise Goals    Row Name 05/31/19 1241             Exercise Goals   Increase Physical Activity  Yes       Intervention  Provide advice, education, support and counseling about physical activity/exercise needs.;Develop an individualized exercise prescription for aerobic and resistive training based on initial evaluation findings, risk stratification, comorbidities and participant's personal goals.       Expected Outcomes  Short Term: Attend rehab on a regular basis to increase amount of physical activity.;Long Term: Add in home exercise to make exercise part of routine and to increase amount of physical activity.;Long Term: Exercising regularly at least 3-5 days a week.       Increase Strength and Stamina  Yes       Intervention  Provide advice, education, support and counseling about physical activity/exercise needs.;Develop an individualized exercise prescription for aerobic and resistive training based on initial evaluation findings, risk stratification, comorbidities and participant's personal goals.       Expected Outcomes  Short Term: Increase workloads from initial exercise prescription for resistance, speed, and METs.;Short Term: Perform resistance training exercises routinely during rehab and add in resistance training at home;Long Term: Improve cardiorespiratory fitness, muscular endurance and strength as measured by increased METs and functional capacity (6MWT)       Able to understand and use rate of perceived exertion (RPE) scale  Yes       Intervention  Provide education and explanation on how to use RPE scale       Expected Outcomes  Short Term: Able to use RPE daily in rehab to express subjective intensity level;Long Term:  Able to use RPE to guide intensity level when  exercising independently       Able to understand and use Dyspnea scale  Yes       Intervention  Provide education and explanation on how to use Dyspnea scale       Expected Outcomes  Short Term: Able to use Dyspnea scale daily in rehab to express subjective sense of shortness of breath during exertion;Long Term: Able to use Dyspnea scale to guide intensity level when exercising independently       Knowledge and understanding of Target Heart Rate Range (THRR)  Yes       Intervention  Provide education and  explanation of THRR including how the numbers were predicted and where they are located for reference       Expected Outcomes  Short Term: Able to state/look up THRR;Short Term: Able to use daily as guideline for intensity in rehab;Long Term: Able to use THRR to govern intensity when exercising independently       Able to check pulse independently  Yes       Intervention  Provide education and demonstration on how to check pulse in carotid and radial arteries.;Review the importance of being able to check your own pulse for safety during independent exercise       Expected Outcomes  Short Term: Able to explain why pulse checking is important during independent exercise;Long Term: Able to check pulse independently and accurately       Understanding of Exercise Prescription  Yes       Intervention  Provide education, explanation, and written materials on patient's individual exercise prescription       Expected Outcomes  Short Term: Able to explain program exercise prescription;Long Term: Able to explain home exercise prescription to exercise independently          Exercise Goals Re-Evaluation: Exercise Goals Re-Evaluation    Row Name 04/30/19 1340 06/11/19 1549 06/13/19 1107 06/26/19 1139 07/09/19 1528     Exercise Goal Re-Evaluation   Exercise Goals Review  Increase Physical Activity;Increase Strength and Stamina;Understanding of Exercise Prescription  Increase Physical Activity;Increase  Strength and Stamina;Able to understand and use rate of perceived exertion (RPE) scale;Knowledge and understanding of Target Heart Rate Range (THRR);Understanding of Exercise Prescription  Increase Physical Activity;Increase Strength and Stamina;Understanding of Exercise Prescription  Increase Physical Activity;Increase Strength and Stamina;Able to understand and use rate of perceived exertion (RPE) scale;Knowledge and understanding of Target Heart Rate Range (THRR);Able to check pulse independently;Understanding of Exercise Prescription  Increase Physical Activity;Increase Strength and Stamina;Understanding of Exercise Prescription   Comments  Clemence has not been able to exercise per doctor's orders due to ongoing angina.  We will continue to follow progression.  Roma has done well since starting back to HT.  Staff will monitor progress  Juron is off to a good start in rehab.  Updated home exercise guidelines. Paxtyn plans to continue to walk at home. eviewed THR, pulse, RPE, sign and symptoms, NTG use, and when to call 911 or MD.  Also discussed weather considerations and indoor options.  Pt voiced understanding.  Chue is doing well on the T5 Nustep.  He has better days than others but does have good effort when he attends.  he continues to rest if he has angina  Landy has been doing well in rehab.  Last week he was having increased chest pain.  He was able to return today and did well.  We will continue to monitor his progress.   Expected Outcomes  Short: Get clearance to exercise.  Long: Cleared to start rehab.  Short : exercise consistently Long - increase MET level  Short: Start to add exercise in at home.  Long: Continue to attend regularly.  Short - be able to work longer before having to rest Long - reach 30 min of continuous exercise without symptoms  Short: Continue to work toward not having as much chest pain.  Long: Continue to add in exercise at home.   [place] Name 07/30/19 9024 08/07/19 1307 08/21/19 1049  09/05/19 1358 09/18/19 1355     Exercise Goal Re-Evaluation   Exercise Goals Review  Increase Physical Activity;Increase Strength  and Stamina;Understanding of Exercise Prescription  Increase Physical Activity;Increase Strength and Stamina;Able to understand and use rate of perceived exertion (RPE) scale;Knowledge and understanding of Target Heart Rate Range (THRR);Able to check pulse independently;Understanding of Exercise Prescription  Increase Physical Activity;Increase Strength and Stamina;Understanding of Exercise Prescription  Increase Physical Activity;Increase Strength and Stamina;Able to understand and use rate of perceived exertion (RPE) scale;Knowledge and understanding of Target Heart Rate Range (THRR);Able to check pulse independently;Understanding of Exercise Prescription  Increase Physical Activity;Increase Strength and Stamina;Understanding of Exercise Prescription   Comments  Jago continues to do well in rehab.  I have sent his wife a video routine for him to work on at home.  He was not quite ready to return this week.  We will continue to encourage him to return to rehab and monitor his progression.  Ozie did return to class.  He only did  the T5 as the seat on XR  wasnt comfortable. Staff wil monitor progress.  Adriaan is doing well in rehab.  He continues to stick with the NuSteps.  We will talk about adding in intervals for a challenge.  We will continue to monitor his progress.  General continues to do well and is able to exercise without angina.  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  Jimel has been doing well in rehab.  He does get to talking and forgets to push himself frequently and needs reminding to work harder.  We will continue to encourage him to work harder and monitor his progress.   Expected Outcomes  Short: Return to classes.  Long: Continue to use videos on off days at home.  Short - attend class consistently Long -  continue exercise on days not at [employer]  Short: Talk about adding in intervals.  Long: Continue to improve stamina.  Short : continue to attend HT Long :  continue to improve stamina  Short: Push harder on stepper.  Long: Continue to improve stamina.      Nutrition & Weight - Outcomes: Pre Biometrics - 05/31/19 1241      Pre Biometrics   Height  5' 6.8" (1.697 m)    Weight  188 lb 8 oz (85.5 kg)    BMI (Calculated)  29.69    Single Leg Stand  1.44 seconds      Post Biometrics - 10/01/19 1148       Post  Biometrics   Height  5' 6.8" (1.697 m)    Weight  195 lb 4.8 oz (88.6 kg)    BMI (Calculated)  30.76    Single Leg Stand  0 seconds       Nutrition: Nutrition Therapy & Goals - 04/15/19 1334      Nutrition Therapy   Diet  HH, low Na diet    Protein (specify units)  75g    Fiber  25 grams    Whole Grain Foods  3 servings    Saturated Fats  12 max. grams    Fruits and Vegetables  5 servings/day    Sodium  1.5 grams      Personal Nutrition Goals   Nutrition Goal  ST: switch out 1 cup of lemonade for 1 cup of >/=75% water & </=25% lemonade LT: maintain wt, keep HgA1c down    Comments  1.5-2 y ago 240lbs, now 185lbs (self reported); 206lbs (per chart). HgA1c: 6.1-6.5. T2DM controlled with diet (cut CHOs) B: Non-fat creamer &  splenda; lost appetite for coffee in the hospital, now drinks decaf 2cups. B: fruits and/or cereal (cheerios) with fruit and almond milk. L: low CHO beer, (1/2 [place] sandwich w/ a bit of lite mayo and lettuce and tomato) and whole wheat pasta with zoodles and sauce. 2-3x/week will have hamburger (no cheese) on whole wheat bread or hamburger steak with muschrooms and onions. Pt reports watching sodium, limited fried food (every now and again will have fried chicken), lemonade (no cal, no sugar - splenda instead). D: vegetable soup (beans and lots of leafy greens). Will eat poultry and shrimp and scallops.      Intervention Plan   Intervention  Prescribe, educate  and counsel regarding individualized specific dietary modifications aiming towards targeted core components such as weight, hypertension, lipid management, diabetes, heart failure and other comorbidities.;Nutrition handout(s) given to patient.   emailed pt handout   Expected Outcomes  Short Term Goal: Understand basic principles of dietary content, such as calories, fat, sodium, cholesterol and nutrients.;Short Term Goal: A plan has been developed with personal nutrition goals set during dietitian appointment.;Long Term Goal: Adherence to prescribed nutrition plan.       Nutrition Discharge: Nutrition Assessments - 05/29/19 1445      MEDFICTS Scores   Pre Score  12       Education Questionnaire Score: Knowledge Questionnaire Score - 05/29/19 1445      Knowledge Questionnaire Score   Pre Score  23/26   Education Focus: Nutrition and Exercise      Goals reviewed with patient; copy given to patient.

## 2019-10-01 NOTE — Patient Instructions (Signed)
Discharge Patient Instructions  Patient Details  Name: Damon English MRN: [id number] Date of Birth: April 29, 1951 Referring Provider:  Lolita Patella, MD   Number of Visits: 24  Reason for Discharge:  Patient reached a stable level of exercise.  Smoking History:  Social History   Tobacco Use  Smoking Status Former Smoker  . Packs/day: 2.00  . Years: 40.00  . Pack years: 80.00  . Quit date: 11/27/2010  . Years since quitting: 8.8  Smokeless Tobacco Never Used  Tobacco Comment   Quit in 2012    Diagnosis:  NSTEMI (non-ST elevation myocardial infarction) ([place])  S/P CABG x 3  Initial Exercise Prescription: Initial Exercise Prescription - 05/31/19 1200      Date of Initial Exercise RX and Referring Provider   Date  05/31/19    Referring Provider  Cloretta Ned MD      Treadmill   MPH  0.8    Grade  0    Minutes  15    METs  1.6      NuStep   Level  1    SPM  80    Minutes  15    METs  1.5      REL-XR   Level  1    Speed  50    Minutes  15    METs  1.5      T5 Nustep   Level  1    SPM  80    Minutes  15    METs  1.5      Prescription Details   Frequency (times per week)  2    Duration  Progress to 30 minutes of continuous aerobic without signs/symptoms of physical distress      Intensity   THRR 40-80% of Max Heartrate  122-143    Ratings of Perceived Exertion  11-13    Perceived Dyspnea  0-4      Progression   Progression  Continue to progress workloads to maintain intensity without signs/symptoms of physical distress.      Resistance Training   Training Prescription  Yes    Weight  3 lbs    Reps  10-15       Discharge Exercise Prescription (Final Exercise Prescription Changes): Exercise Prescription Changes - 09/18/19 1300      Response to Exercise   Blood Pressure (Admit)  130/60    Blood Pressure (Exercise)  140/70    Blood Pressure (Exit)  124/68    Heart Rate (Admit)  83 bpm    Heart Rate (Exercise)  85 bpm    Heart Rate  (Exit)  70 bpm    Rating of Perceived Exertion (Exercise)  13    Symptoms  none    Duration  Continue with 30 min of aerobic exercise without signs/symptoms of physical distress.    Intensity  THRR unchanged      Progression   Progression  Continue to progress workloads to maintain intensity without signs/symptoms of physical distress.    Average METs  1.5      Resistance Training   Training Prescription  Yes    Weight  3 lb    Reps  10-15      Interval Training   Interval Training  No      NuStep   Level  5    Minutes  30    METs  1.5      T5 Nustep   Level  3    Minutes  [place] to continue exercise at  Home (comment)   walking, staff videos   Frequency  Add 2 additional days to program exercise sessions.    Initial Home Exercises Provided  06/13/19       Functional Capacity: 6 Minute Walk    Row Name 05/31/19 1234 10/01/19 1146       6 Minute Walk   Phase  Initial  Discharge    Distance  370 feet  748 feet    Distance % Change  --  102 %    Distance Feet Change  --  378 ft    Walk Time  3.97 minutes  6 minutes    # of Rest Breaks  4 15 sec, 15 sec, 20 sec, stopped at 4:48  --    MPH  1.06  1.42    METS  1.91  2.27    RPE  13  15    Perceived Dyspnea   1  2    VO2 Peak  6.67  7.96    Symptoms  Yes (comment)  Yes (comment)    Comments  fatigue, SOB, R hip/back support, chest pain 5/10  Fatigue, leg sore, used walker after fall last year    Resting HR  100 bpm  99 bpm    Resting BP  142/70  132/60    Resting Oxygen Saturation   98 %  --    Exercise Oxygen Saturation  during 6 min walk  96 %  --    Max Ex. HR  134 bpm  117 bpm    Max Ex. BP  152/74  148/74    2 Minute Post BP  146/64  --       Quality of Life: Quality of Life - 05/29/19 1442      Quality of Life   Select  Quality of Life      Quality of Life Scores   Health/Function Pre  8.6 %    Socioeconomic Pre  26.43 %    Psych/Spiritual Pre  15.57 %    Family Pre   25.2 %    GLOBAL Pre  16.44 %       Personal Goals: Goals established at orientation with interventions provided to work toward goal. Personal Goals and Risk Factors at Admission - 05/31/19 1242      Core Components/Risk Factors/Patient Goals on Admission    Weight Management  Yes;Weight Loss    Intervention  Weight Management: Develop a combined nutrition and exercise program designed to reach desired caloric intake, while maintaining appropriate intake of nutrient and fiber, sodium and fats, and appropriate energy expenditure required for the weight goal.;Weight Management: Provide education and appropriate resources to help participant work on and attain dietary goals.    Admit Weight  188 lb 8 oz (85.5 kg)    Goal Weight: Short Term  183 lb (83 kg)    Goal Weight: Long Term  180 lb (81.6 kg)    Expected Outcomes  Short Term: Continue to assess and modify interventions until short term weight is achieved;Long Term: Adherence to nutrition and physical activity/exercise program aimed toward attainment of established weight goal;Weight Loss: Understanding of general recommendations for a balanced deficit meal plan, which promotes 1-2 lb weight loss per week and includes a negative energy balance of [phone] kcal/d;Understanding recommendations for meals to include 15-35% energy as protein, 25-35% energy from fat, 35-60% energy from carbohydrates, less  than '200mg'$  of dietary cholesterol, 20-35 gm of total fiber daily;Understanding of distribution of calorie intake throughout the day with the consumption of 4-5 meals/snacks    Diabetes  Yes    Intervention  Provide education about signs/symptoms and action to take for hypo/hyperglycemia.;Provide education about proper nutrition, including hydration, and aerobic/resistive exercise prescription along with prescribed medications to achieve blood glucose in normal ranges: Fasting glucose 65-99 mg/dL    Expected Outcomes  Short Term: Participant  verbalizes understanding of the signs/symptoms and immediate care of hyper/hypoglycemia, proper foot care and importance of medication, aerobic/resistive exercise and nutrition plan for blood glucose control.;Long Term: Attainment of HbA1C < 7%.    Hypertension  Yes    Intervention  Provide education on lifestyle modifcations including regular physical activity/exercise, weight management, moderate sodium restriction and increased consumption of fresh fruit, vegetables, and low fat dairy, alcohol moderation, and smoking cessation.;Monitor prescription use compliance.    Expected Outcomes  Short Term: Continued assessment and intervention until BP is < 140/71m HG in hypertensive participants. < 130/859mHG in hypertensive participants with diabetes, heart failure or chronic kidney disease.;Long Term: Maintenance of blood pressure at goal levels.    Lipids  Yes    Intervention  Provide education and support for participant on nutrition & aerobic/resistive exercise along with prescribed medications to achieve LDL '70mg'$ , HDL >'40mg'$ .    Expected Outcomes  Short Term: Participant states understanding of desired cholesterol values and is compliant with medications prescribed. Participant is following exercise prescription and nutrition guidelines.;Long Term: Cholesterol controlled with medications as prescribed, with individualized exercise RX and with personalized nutrition plan. Value goals: LDL < '70mg'$ , HDL > 40 mg.        Personal Goals Discharge: Goals and Risk Factor Review - 09/03/19 1110      Core Components/Risk Factors/Patient Goals Review   Personal Goals Review  Weight Management/Obesity;Hypertension;Diabetes    Review  [place] taking heart meds as directed - sometimes forgets glaucoma meds.  He has an oatmeal cookie every other day.  His BG has been great.  He usually avoids fried food but had some hushpuppies this weekend.  He has not needed nitro recently.    Expected Outcomes  Short - eat  more fruits and vegetables Long - reach goal weight       Exercise Goals and Review: Exercise Goals    Row Name 05/31/19 1241             Exercise Goals   Increase Physical Activity  Yes       Intervention  Provide advice, education, support and counseling about physical activity/exercise needs.;Develop an individualized exercise prescription for aerobic and resistive training based on initial evaluation findings, risk stratification, comorbidities and participant's personal goals.       Expected Outcomes  Short Term: Attend rehab on a regular basis to increase amount of physical activity.;Long Term: Add in home exercise to make exercise part of routine and to increase amount of physical activity.;Long Term: Exercising regularly at least 3-5 days a week.       Increase Strength and Stamina  Yes       Intervention  Provide advice, education, support and counseling about physical activity/exercise needs.;Develop an individualized exercise prescription for aerobic and resistive training based on initial evaluation findings, risk stratification, comorbidities and participant's personal goals.       Expected Outcomes  Short Term: Increase workloads from initial exercise prescription for resistance, speed, and METs.;Short Term: Perform resistance training exercises routinely  during rehab and add in resistance training at home;Long Term: Improve cardiorespiratory fitness, muscular endurance and strength as measured by increased METs and functional capacity (6MWT)       Able to understand and use rate of perceived exertion (RPE) scale  Yes       Intervention  Provide education and explanation on how to use RPE scale       Expected Outcomes  Short Term: Able to use RPE daily in rehab to express subjective intensity level;Long Term:  Able to use RPE to guide intensity level when exercising independently       Able to understand and use Dyspnea scale  Yes       Intervention  Provide education and  explanation on how to use Dyspnea scale       Expected Outcomes  Short Term: Able to use Dyspnea scale daily in rehab to express subjective sense of shortness of breath during exertion;Long Term: Able to use Dyspnea scale to guide intensity level when exercising independently       Knowledge and understanding of Target Heart Rate Range (THRR)  Yes       Intervention  Provide education and explanation of THRR including how the numbers were predicted and where they are located for reference       Expected Outcomes  Short Term: Able to state/look up THRR;Short Term: Able to use daily as guideline for intensity in rehab;Long Term: Able to use THRR to govern intensity when exercising independently       Able to check pulse independently  Yes       Intervention  Provide education and demonstration on how to check pulse in carotid and radial arteries.;Review the importance of being able to check your own pulse for safety during independent exercise       Expected Outcomes  Short Term: Able to explain why pulse checking is important during independent exercise;Long Term: Able to check pulse independently and accurately       Understanding of Exercise Prescription  Yes       Intervention  Provide education, explanation, and written materials on patient's individual exercise prescription       Expected Outcomes  Short Term: Able to explain program exercise prescription;Long Term: Able to explain home exercise prescription to exercise independently          Exercise Goals Re-Evaluation: Exercise Goals Re-Evaluation    Row Name 04/30/19 1340 06/11/19 1549 06/13/19 1107 06/26/19 1139 07/09/19 1528     Exercise Goal Re-Evaluation   Exercise Goals Review  Increase Physical Activity;Increase Strength and Stamina;Understanding of Exercise Prescription  Increase Physical Activity;Increase Strength and Stamina;Able to understand and use rate of perceived exertion (RPE) scale;Knowledge and understanding of Target  Heart Rate Range (THRR);Understanding of Exercise Prescription  Increase Physical Activity;Increase Strength and Stamina;Understanding of Exercise Prescription  Increase Physical Activity;Increase Strength and Stamina;Able to understand and use rate of perceived exertion (RPE) scale;Knowledge and understanding of Target Heart Rate Range (THRR);Able to check pulse independently;Understanding of Exercise Prescription  Increase Physical Activity;Increase Strength and Stamina;Understanding of Exercise Prescription   Comments  Dock has not been able to exercise per doctor's orders due to ongoing angina.  We will continue to follow progression.  [place] has done well since starting back to HT.  Staff will monitor progress  [place] is off to a good start in rehab.  Updated home exercise guidelines. Jaycee plans to continue to walk at home. eviewed THR, pulse, RPE, sign and symptoms, NTG use,  and when to call 911 or MD.  Also discussed weather considerations and indoor options.  Pt voiced understanding.  [place] is doing well on the T5 Nustep.  He has better days than others but does have good effort when he attends.  he continues to rest if he has angina  [place] has been doing well in rehab.  Last week he was having increased chest pain.  He was able to return today and did well.  We will continue to monitor his progress.   Expected Outcomes  Short: Get clearance to exercise.  Long: Cleared to start rehab.  Short : exercise consistently Long - increase MET level  Short: Start to add exercise in at home.  Long: Continue to attend regularly.  Short - be able to work longer before having to rest Long - reach 30 min of continuous exercise without symptoms  Short: Continue to work toward not having as much chest pain.  Long: Continue to add in exercise at home.   [place] Name 07/30/19 9166 08/07/19 1307 08/21/19 1049 09/05/19 1358 09/18/19 1355     Exercise Goal Re-Evaluation   Exercise Goals Review  Increase Physical Activity;Increase  Strength and Stamina;Understanding of Exercise Prescription  Increase Physical Activity;Increase Strength and Stamina;Able to understand and use rate of perceived exertion (RPE) scale;Knowledge and understanding of Target Heart Rate Range (THRR);Able to check pulse independently;Understanding of Exercise Prescription  Increase Physical Activity;Increase Strength and Stamina;Understanding of Exercise Prescription  Increase Physical Activity;Increase Strength and Stamina;Able to understand and use rate of perceived exertion (RPE) scale;Knowledge and understanding of Target Heart Rate Range (THRR);Able to check pulse independently;Understanding of Exercise Prescription  Increase Physical Activity;Increase Strength and Stamina;Understanding of Exercise Prescription   Comments  Dimitrius continues to do well in rehab.  I have sent his wife a video routine for him to work on at home.  He was not quite ready to return this week.  We will continue to encourage him to return to rehab and monitor his progression.  Derrill did return to class.  He only did  the T5 as the seat on XR  wasnt comfortable. Staff wil monitor progress.  Jamiah is doing well in rehab.  He continues to stick with the NuSteps.  We will talk about adding in intervals for a challenge.  We will continue to monitor his progress.  Marcell continues to do well and is able to exercise without angina.  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  [place] has been doing well in rehab.  He does get to talking and forgets to push himself frequently and needs reminding to work harder.  We will continue to encourage him to work harder and monitor his progress.   Expected Outcomes  Short: Return to classes.  Long: Continue to use videos on off days at home.  Short - attend class consistently Long - continue exercise on days not at [facility]  Short: Talk about adding in intervals.  Long: Continue to improve stamina.  Short : continue  to attend HT Long :  continue to improve stamina  Short: Push harder on stepper.  Long: Continue to improve stamina.      Nutrition & Weight - Outcomes: Pre Biometrics - 05/31/19 1241      Pre Biometrics   Height  5' 6.8" (1.697 m)    Weight  188 lb 8 oz (85.5 kg)    BMI (Calculated)  29.69  Single Leg Stand  1.44 seconds      Post Biometrics - 10/01/19 1148       Post  Biometrics   Height  5' 6.8" (1.697 m)    Weight  195 lb 4.8 oz (88.6 kg)    BMI (Calculated)  30.76    Single Leg Stand  0 seconds       Nutrition: Nutrition Therapy & Goals - 04/15/19 1334      Nutrition Therapy   Diet  HH, low Na diet    Protein (specify units)  75g    Fiber  25 grams    Whole Grain Foods  3 servings    Saturated Fats  12 max. grams    Fruits and Vegetables  5 servings/day    Sodium  1.5 grams      Personal Nutrition Goals   Nutrition Goal  ST: switch out 1 cup of lemonade for 1 cup of >/=75% water & </=25% lemonade LT: maintain wt, keep HgA1c down    Comments  1.5-2 y ago 240lbs, now 185lbs (self reported); 206lbs (per chart). HgA1c: 6.1-6.5. T2DM controlled with diet (cut CHOs) B: Non-fat creamer & splenda; lost appetite for coffee in the hospital, now drinks decaf 2cups. B: fruits and/or cereal (cheerios) with fruit and almond milk. L: low CHO beer, (1/2 [place] sandwich w/ a bit of lite mayo and lettuce and tomato) and whole wheat pasta with zoodles and sauce. 2-3x/week will have hamburger (no cheese) on whole wheat bread or hamburger steak with muschrooms and onions. Pt reports watching sodium, limited fried food (every now and again will have fried chicken), lemonade (no cal, no sugar - splenda instead). D: vegetable soup (beans and lots of leafy greens). Will eat poultry and shrimp and scallops.      Intervention Plan   Intervention  Prescribe, educate and counsel regarding individualized specific dietary modifications aiming towards targeted core components such as weight,  hypertension, lipid management, diabetes, heart failure and other comorbidities.;Nutrition handout(s) given to patient.   emailed pt handout   Expected Outcomes  Short Term Goal: Understand basic principles of dietary content, such as calories, fat, sodium, cholesterol and nutrients.;Short Term Goal: A plan has been developed with personal nutrition goals set during dietitian appointment.;Long Term Goal: Adherence to prescribed nutrition plan.       Nutrition Discharge: Nutrition Assessments - 05/29/19 1445      MEDFICTS Scores   Pre Score  12       Education Questionnaire Score: Knowledge Questionnaire Score - 05/29/19 1445      Knowledge Questionnaire Score   Pre Score  23/26   Education Focus: Nutrition and Exercise      Goals reviewed with patient; copy given to patient.

## 2019-10-01 NOTE — Progress Notes (Signed)
Cardiac Individual Treatment Plan  Patient Details  Name: Damon English MRN: [id number] Date of Birth: [birth year] Referring Provider:     Cardiac Rehab from 05/31/2019 in [facility] Cardiac and Pulmonary Rehab  Referring Provider  Cloretta Ned MD      Initial Encounter Date:    Cardiac Rehab from 05/31/2019 in [facility] Cardiac and Pulmonary Rehab  Date  05/31/19      Visit Diagnosis: NSTEMI (non-ST elevation myocardial infarction) ([place])  S/P CABG x 3  Patient's Home Medications on Admission:  Current Outpatient Medications:  .  apixaban (ELIQUIS) 5 MG TABS tablet, Take by mouth., Disp: , Rfl:  .  aspirin 81 MG chewable tablet, Chew by mouth., Disp: , Rfl:  .  atorvastatin (LIPITOR) 80 MG tablet, Take by mouth., Disp: , Rfl:  .  clonazePAM (KLONOPIN) 0.5 MG tablet, Take 0.25-0.5 mg by mouth 3 (three) times daily as needed for anxiety. , Disp: , Rfl:  .  clopidogrel (PLAVIX) 75 MG tablet, Take 75 mg by mouth daily., Disp: , Rfl:  .  DULoxetine (CYMBALTA) 60 MG capsule, Take 1 capsule (60 mg total) by mouth 2 (two) times daily. (Patient not taking: Reported on 03/27/2019), Disp: 20 capsule, Rfl: 0 .  esomeprazole (NEXIUM) 40 MG capsule, Take 1 capsule by mouth daily., Disp: , Rfl:  .  famotidine (PEPCID) 40 MG tablet, Take 1 tablet by mouth every morning., Disp: , Rfl:  .  HYDROcodone-acetaminophen (NORCO/VICODIN) 5-325 MG tablet, Take 0.5-1 tablets by mouth 2 (two) times daily as needed for pain., Disp: , Rfl:  .  Latanoprost 0.005 % EMUL, Apply to eye., Disp: , Rfl:  .  Magnesium (V-R MAGNESIUM) 250 MG TABS, Take by mouth., Disp: , Rfl:  .  metoprolol succinate (TOPROL-XL) 50 MG 24 hr tablet, Take 50 mg by mouth 2 (two) times a day. Take with or immediately following a meal., Disp: , Rfl:  .  ramipril (ALTACE) 2.5 MG capsule, Take by mouth., Disp: , Rfl:  .  ranolazine (RANEXA) 500 MG 12 hr tablet, Take 500 mg by mouth 2 (two) times daily., Disp: , Rfl:  .  SENNOSIDES PO, Take by mouth.,  Disp: , Rfl:  .  spironolactone (ALDACTONE) 25 MG tablet, Take 25 mg by mouth., Disp: , Rfl:  .  sucralfate (CARAFATE) 1 g tablet, Take 1 tablet by mouth 4 (four) times daily -  before meals and at bedtime., Disp: , Rfl:   Past Medical History: Past Medical History:  Diagnosis Date  . Alcohol use    "cutting back" but still heavy and daily, multiple shots of bourbon each night  . Diabetes mellitus without complication ([place])   . History of tobacco use    reportedly quit in 2013  . Hypertension   . Pulmonary embolism ([place]) 2012   unprovoked  . PVD (peripheral vascular disease) ([place])     Tobacco Use: Social History   Tobacco Use  Smoking Status Former Smoker  . Packs/day: 2.00  . Years: 40.00  . Pack years: 80.00  . Quit date: 11/27/2010  . Years since quitting: 8.8  Smokeless Tobacco Never Used  Tobacco Comment   Quit in 2012    Labs: Recent Review Flowsheet Data    Labs for ITP Cardiac and Pulmonary Rehab Latest Ref Rng & Units 01/28/2019 01/29/2019   Cholestrol 0 - 200 mg/dL 168 143   LDLCALC 0 - 99 mg/dL 113(H) 93   HDL >40 mg/dL 36(L) 33(L)   Trlycerides <150 mg/dL 97  87       Exercise Target Goals: Exercise Program Goal: Individual exercise prescription set using results from initial 6 min walk test and THRR while considering  patient's activity barriers and safety.   Exercise Prescription Goal: Initial exercise prescription builds to 30-45 minutes a day of aerobic activity, 2-3 days per week.  Home exercise guidelines will be given to patient during program as part of exercise prescription that the participant will acknowledge.  Activity Barriers & Risk Stratification: Activity Barriers & Cardiac Risk Stratification - 05/31/19 1238      Activity Barriers & Cardiac Risk Stratification   Activity Barriers  Muscular Weakness;Deconditioning;Back Problems;Other (comment);Chest Pain/Angina;Shortness of Breath;Assistive Device;Balance Concerns;Decreased Ventricular  Function    Comments  R leg numb/pain post-op, Hx back surgery, PAD, r foot numb, uses walker on occasion    Cardiac Risk Stratification  High       6 Minute Walk: 6 Minute Walk    Row Name 05/31/19 1234 10/01/19 1146       6 Minute Walk   Phase  Initial  Discharge    Distance  370 feet  748 feet    Distance % Change  -  102 %    Distance Feet Change  -  378 ft    Walk Time  3.97 minutes  6 minutes    # of Rest Breaks  4 15 sec, 15 sec, 20 sec, stopped at 4:48  -    MPH  1.06  1.42    METS  1.91  2.27    RPE  13  15    Perceived Dyspnea   1  2    VO2 Peak  6.67  7.96    Symptoms  Yes (comment)  Yes (comment)    Comments  fatigue, SOB, R hip/back support, chest pain 5/10  Fatigue, leg sore, used walker after fall last year    Resting HR  100 bpm  99 bpm    Resting BP  142/70  132/60    Resting Oxygen Saturation   98 %  -    Exercise Oxygen Saturation  during 6 min walk  96 %  -    Max Ex. HR  134 bpm  117 bpm    Max Ex. BP  152/74  148/74    2 Minute Post BP  146/64  -       Oxygen Initial Assessment:   Oxygen Re-Evaluation:   Oxygen Discharge (Final Oxygen Re-Evaluation):   Initial Exercise Prescription: Initial Exercise Prescription - 05/31/19 1200      Date of Initial Exercise RX and Referring Provider   Date  05/31/19    Referring Provider  Cloretta Ned MD      Treadmill   MPH  0.8    Grade  0    Minutes  15    METs  1.6      NuStep   Level  1    SPM  80    Minutes  15    METs  1.5      REL-XR   Level  1    Speed  50    Minutes  15    METs  1.5      T5 Nustep   Level  1    SPM  80    Minutes  15    METs  1.5      Prescription Details   Frequency (times per week)  2    Duration  Progress to 30 minutes of continuous aerobic without signs/symptoms of physical distress      Intensity   THRR 40-80% of Max Heartrate  122-143    Ratings of Perceived Exertion  11-13    Perceived Dyspnea  0-4      Progression   Progression  Continue  to progress workloads to maintain intensity without signs/symptoms of physical distress.      Resistance Training   Training Prescription  Yes    Weight  3 lbs    Reps  10-15       Perform Capillary Blood Glucose checks as needed.  Exercise Prescription Changes: Exercise Prescription Changes    Row Name 05/31/19 1200 06/07/19 0800 06/11/19 1500 06/26/19 1100 07/09/19 1500     Response to Exercise   Blood Pressure (Admit)  142/70  124/58  148/70  140/76  140/68   Blood Pressure (Exercise)  152/74  148/74  150/80  152/74  152/76   Blood Pressure (Exit)  146/64  120/80  140/80  124/66  114/50   Heart Rate (Admit)  100 bpm  66 bpm  69 bpm  71 bpm  78 bpm   Heart Rate (Exercise)  134 bpm  93 bpm  98 bpm  103 bpm  91 bpm   Heart Rate (Exit)  93 bpm  64 bpm  68 bpm  69 bpm  68 bpm   Oxygen Saturation (Admit)  98 %  -  -  -  -   Oxygen Saturation (Exercise)  96 %  -  -  -  -   Rating of Perceived Exertion (Exercise)  _0 Perceived Dyspnea (Exercise)  1  -  -  -  -   Symptoms  fatigue, SOB, chest pain  -  -  -  none   Comments  walk test results  -  -  -  -   Duration  -  Progress to 30 minutes of  aerobic without signs/symptoms of physical distress  Progress to 30 minutes of  aerobic without signs/symptoms of physical distress  Progress to 30 minutes of  aerobic without signs/symptoms of physical distress  Progress to 30 minutes of  aerobic without signs/symptoms of physical distress   Intensity  -  THRR unchanged  THRR unchanged  THRR unchanged  THRR unchanged     Progression   Progression  -  Continue to progress workloads to maintain intensity without signs/symptoms of physical distress.  Continue to progress workloads to maintain intensity without signs/symptoms of physical distress.  Continue to progress workloads to maintain intensity without signs/symptoms of physical distress.  Continue to progress workloads to maintain intensity without signs/symptoms of physical  distress.   Average METs  -  1.9  1.8  1.8  1.6     Resistance Training   Training Prescription  -  Yes  Yes  Yes  Yes   Weight  -  2 lb per session detail sheet  3 lb  3 lb  3 lbs   Reps  -  10-15  10-15  10-15  10-15     Interval Training   Interval Training  -  No  No  No  - n     NuStep   Level  -  1  -  -  1   SPM  -  80  -  -  -   Minutes  -  15  -  -  30   METs  -  1.9  -  -  1.6     T5 Nustep   Level  -  -  -  1  -   SPM  -  -  -  80  -   Minutes  -  -  -  15  -   METs  -  -  -  1.8  -     Home Exercise Plan   Plans to continue exercise at  -  -  -  -  Home (comment) walking, staff videos   Frequency  -  -  -  -  Add 2 additional days to program exercise sessions.   Initial Home Exercises Provided  -  -  -  -  06/13/19   Row Name 07/30/19 0900 08/07/19 1300 08/21/19 1000 09/05/19 1300 09/18/19 1300     Response to Exercise   Blood Pressure (Admit)  118/58  140/76  130/54  134/66  130/60   Blood Pressure (Exercise)  146/72  140/62  140/60  160/70  140/70   Blood Pressure (Exit)  146/74  120/68  112/62  138/64  124/68   Heart Rate (Admit)  87 bpm  96 bpm  88 bpm  74 bpm  83 bpm   Heart Rate (Exercise)  108 bpm  110 bpm  99 bpm  100 bpm  85 bpm   Heart Rate (Exit)  79 bpm  81 bpm  87 bpm  70 bpm  70 bpm   Rating of Perceived Exertion (Exercise)  _0 Symptoms  none  none  none  none  none   Duration  Continue with 30 min of aerobic exercise without signs/symptoms of physical distress.  Continue with 30 min of aerobic exercise without signs/symptoms of physical distress.  Continue with 30 min of aerobic exercise without signs/symptoms of physical distress.  Continue with 30 min of aerobic exercise without signs/symptoms of physical distress.  Continue with 30 min of aerobic exercise without signs/symptoms of physical distress.   Intensity  THRR unchanged  THRR unchanged  THRR unchanged  THRR unchanged  THRR unchanged     Progression   Progression   Continue to progress workloads to maintain intensity without signs/symptoms of physical distress.  Continue to progress workloads to maintain intensity without signs/symptoms of physical distress.  Continue to progress workloads to maintain intensity without signs/symptoms of physical distress.  Continue to progress workloads to maintain intensity without signs/symptoms of physical distress.  Continue to progress workloads to maintain intensity without signs/symptoms of physical distress.   Average METs  1.95  1.8  1.7  1.9  1.5     Resistance Training   Training Prescription  Yes  Yes  Yes  Yes  Yes   Weight  3 lbs  3 lb  3 lbs  3 lb  3 lb   Reps  10-15  10-15  10-15  10-15  10-15     Interval Training   Interval Training  No  -  No  -  No     NuStep   Level  3  -  3  -  5   Minutes  30  -  15  -  30   METs  2  -  1.8  -  1.5     REL-XR   Level  -  4  -  -  -  Speed  -  50  -  -  -   Minutes  -  15  -  -  -   METs  -  1.9  -  -  -     T5 Nustep   Level  -  _0 SPM  -  80  -  80  -   Minutes  -  _1 METs  -  1.8  1.8  1.9  -     Home Exercise Plan   Plans to continue exercise at  Home (comment) walking, staff videos  Home (comment) walking, staff videos  Home (comment) walking, staff videos  Home (comment) walking, staff videos  Home (comment) walking, staff videos   Frequency  Add 2 additional days to program exercise sessions.  Add 2 additional days to program exercise sessions.  Add 2 additional days to program exercise sessions.  Add 2 additional days to program exercise sessions.  Add 2 additional days to program exercise sessions.   Initial Home Exercises Provided  06/13/19  06/13/19  06/13/19  06/13/19  06/13/19      Exercise Comments: Exercise Comments    Row Name 06/06/19 1137 07/24/19 1337 10/01/19 1157       Exercise Comments  Continues to experience angina symptoms. Has been able to stop, rest and symptoms resolve.  Resent links for YouTube  videos in order to follow exercise prescription at home.  Vladislav graduated today from  rehab with 24 sessions completed.  Details of the patient's exercise prescription and what He needs to do in order to continue the prescription and progress were discussed with patient.  Patient was given a copy of prescription and goals.  Patient verbalized understanding.  Radames plans to continue to exercise by using chair exercises and PT at home.        Exercise Goals and Review: Exercise Goals    Row Name 05/31/19 1241             Exercise Goals   Increase Physical Activity  Yes       Intervention  Provide advice, education, support and counseling about physical activity/exercise needs.;Develop an individualized exercise prescription for aerobic and resistive training based on initial evaluation findings, risk stratification, comorbidities and participant's personal goals.       Expected Outcomes  Short Term: Attend rehab on a regular basis to increase amount of physical activity.;Long Term: Add in home exercise to make exercise part of routine and to increase amount of physical activity.;Long Term: Exercising regularly at least 3-5 days a week.       Increase Strength and Stamina  Yes       Intervention  Provide advice, education, support and counseling about physical activity/exercise needs.;Develop an individualized exercise prescription for aerobic and resistive training based on initial evaluation findings, risk stratification, comorbidities and participant's personal goals.       Expected Outcomes  Short Term: Increase workloads from initial exercise prescription for resistance, speed, and METs.;Short Term: Perform resistance training exercises routinely during rehab and add in resistance training at home;Long Term: Improve cardiorespiratory fitness, muscular endurance and strength as measured by increased METs and functional capacity (6MWT)       Able to understand and use rate of perceived exertion  (RPE) scale  Yes       Intervention  Provide education and explanation on how to use RPE scale  Expected Outcomes  Short Term: Able to use RPE daily in rehab to express subjective intensity level;Long Term:  Able to use RPE to guide intensity level when exercising independently       Able to understand and use Dyspnea scale  Yes       Intervention  Provide education and explanation on how to use Dyspnea scale       Expected Outcomes  Short Term: Able to use Dyspnea scale daily in rehab to express subjective sense of shortness of breath during exertion;Long Term: Able to use Dyspnea scale to guide intensity level when exercising independently       Knowledge and understanding of Target Heart Rate Range (THRR)  Yes       Intervention  Provide education and explanation of THRR including how the numbers were predicted and where they are located for reference       Expected Outcomes  Short Term: Able to state/look up THRR;Short Term: Able to use daily as guideline for intensity in rehab;Long Term: Able to use THRR to govern intensity when exercising independently       Able to check pulse independently  Yes       Intervention  Provide education and demonstration on how to check pulse in carotid and radial arteries.;Review the importance of being able to check your own pulse for safety during independent exercise       Expected Outcomes  Short Term: Able to explain why pulse checking is important during independent exercise;Long Term: Able to check pulse independently and accurately       Understanding of Exercise Prescription  Yes       Intervention  Provide education, explanation, and written materials on patient's individual exercise prescription       Expected Outcomes  Short Term: Able to explain program exercise prescription;Long Term: Able to explain home exercise prescription to exercise independently          Exercise Goals Re-Evaluation : Exercise Goals Re-Evaluation    Row Name 04/30/19  1340 06/11/19 1549 06/13/19 1107 06/26/19 1139 07/09/19 1528     Exercise Goal Re-Evaluation   Exercise Goals Review  Increase Physical Activity;Increase Strength and Stamina;Understanding of Exercise Prescription  Increase Physical Activity;Increase Strength and Stamina;Able to understand and use rate of perceived exertion (RPE) scale;Knowledge and understanding of Target Heart Rate Range (THRR);Understanding of Exercise Prescription  Increase Physical Activity;Increase Strength and Stamina;Understanding of Exercise Prescription  Increase Physical Activity;Increase Strength and Stamina;Able to understand and use rate of perceived exertion (RPE) scale;Knowledge and understanding of Target Heart Rate Range (THRR);Able to check pulse independently;Understanding of Exercise Prescription  Increase Physical Activity;Increase Strength and Stamina;Understanding of Exercise Prescription   Comments  Ozil has not been able to exercise per doctor's orders due to ongoing angina.  We will continue to follow progression.  Jacarius has done well since starting back to HT.  Staff will monitor progress  Nicklaus is off to a good start in rehab.  Updated home exercise guidelines. Demarkus plans to continue to walk at home. eviewed THR, pulse, RPE, sign and symptoms, NTG use, and when to call 911 or MD.  Also discussed weather considerations and indoor options.  Pt voiced understanding.  Toure is doing well on the T5 Nustep.  He has better days than others but does have good effort when he attends.  he continues to rest if he has angina  Obrien has been doing well in rehab.  Last week he was having increased chest pain.  He was able to return today and did well.  We will continue to monitor his progress.   Expected Outcomes  Short: Get clearance to exercise.  Long: Cleared to start rehab.  Short : exercise consistently Long - increase MET level  Short: Start to add exercise in at home.  Long: Continue to attend regularly.  Short - be able to  work longer before having to rest Long - reach 30 min of continuous exercise without symptoms  Short: Continue to work toward not having as much chest pain.  Long: Continue to add in exercise at home.   [place] Name 07/30/19 0254 08/07/19 1307 08/21/19 1049 09/05/19 1358 09/18/19 1355     Exercise Goal Re-Evaluation   Exercise Goals Review  Increase Physical Activity;Increase Strength and Stamina;Understanding of Exercise Prescription  Increase Physical Activity;Increase Strength and Stamina;Able to understand and use rate of perceived exertion (RPE) scale;Knowledge and understanding of Target Heart Rate Range (THRR);Able to check pulse independently;Understanding of Exercise Prescription  Increase Physical Activity;Increase Strength and Stamina;Understanding of Exercise Prescription  Increase Physical Activity;Increase Strength and Stamina;Able to understand and use rate of perceived exertion (RPE) scale;Knowledge and understanding of Target Heart Rate Range (THRR);Able to check pulse independently;Understanding of Exercise Prescription  Increase Physical Activity;Increase Strength and Stamina;Understanding of Exercise Prescription   Comments  Iori continues to do well in rehab.  I have sent his wife a video routine for him to work on at home.  He was not quite ready to return this week.  We will continue to encourage him to return to rehab and monitor his progression.  Wendle did return to class.  He only did  the T5 as the seat on XR  wasnt comfortable. Staff wil monitor progress.  Samad is doing well in rehab.  He continues to stick with the NuSteps.  We will talk about adding in intervals for a challenge.  We will continue to monitor his progress.  Maxine continues to do well and is able to exercise without angina.  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  Jamaine has been doing well in rehab.  He does get to talking and forgets to push himself frequently and  needs reminding to work harder.  We will continue to encourage him to work harder and monitor his progress.   Expected Outcomes  Short: Return to classes.  Long: Continue to use videos on off days at home.  Short - attend class consistently Long - continue exercise on days not at [facility]  Short: Talk about adding in intervals.  Long: Continue to improve stamina.  Short : continue to attend HT Long :  continue to improve stamina  Short: Push harder on stepper.  Long: Continue to improve stamina.      Discharge Exercise Prescription (Final Exercise Prescription Changes): Exercise Prescription Changes - 09/18/19 1300      Response to Exercise   Blood Pressure (Admit)  130/60    Blood Pressure (Exercise)  140/70    Blood Pressure (Exit)  124/68    Heart Rate (Admit)  83 bpm    Heart Rate (Exercise)  85 bpm    Heart Rate (Exit)  70 bpm    Rating of Perceived Exertion (Exercise)  13    Symptoms  none    Duration  Continue with 30 min of aerobic exercise without signs/symptoms of physical distress.    Intensity  THRR unchanged  Progression   Progression  Continue to progress workloads to maintain intensity without signs/symptoms of physical distress.    Average METs  1.5      Resistance Training   Training Prescription  Yes    Weight  3 lb    Reps  10-15      Interval Training   Interval Training  No      NuStep   Level  5    Minutes  30    METs  1.5      T5 Nustep   Level  3    Minutes  30      Home Exercise Plan   Plans to continue exercise at  Home (comment)   walking, staff videos   Frequency  Add 2 additional days to program exercise sessions.    Initial Home Exercises Provided  06/13/19       Nutrition:  Target Goals: Understanding of nutrition guidelines, daily intake of sodium <1598m, cholesterol <2011m calories 30% from fat and 7% or less from saturated fats, daily to have 5 or more servings of fruits and vegetables.  Biometrics: Pre Biometrics - 05/31/19 1241       Pre Biometrics   Height  5' 6.8" (1.697 m)    Weight  188 lb 8 oz (85.5 kg)    BMI (Calculated)  29.69    Single Leg Stand  1.44 seconds      Post Biometrics - 10/01/19 1148       Post  Biometrics   Height  5' 6.8" (1.697 m)    Weight  195 lb 4.8 oz (88.6 kg)    BMI (Calculated)  30.76    Single Leg Stand  0 seconds       Nutrition Therapy Plan and Nutrition Goals: Nutrition Therapy & Goals - 04/15/19 1334      Nutrition Therapy   Diet  HH, low Na diet    Protein (specify units)  75g    Fiber  25 grams    Whole Grain Foods  3 servings    Saturated Fats  12 max. grams    Fruits and Vegetables  5 servings/day    Sodium  1.5 grams      Personal Nutrition Goals   Nutrition Goal  ST: switch out 1 cup of lemonade for 1 cup of >/=75% water & </=25% lemonade LT: maintain wt, keep HgA1c down    Comments  1.5-2 y ago 240lbs, now 185lbs (self reported); 206lbs (per chart). HgA1c: 6.1-6.5. T2DM controlled with diet (cut CHOs) B: Non-fat creamer & splenda; lost appetite for coffee in the hospital, now drinks decaf 2cups. B: fruits and/or cereal (cheerios) with fruit and almond milk. L: low CHO beer, (1/2 tu[place]andwich w/ a bit of lite mayo and lettuce and tomato) and whole wheat pasta with zoodles and sauce. 2-3x/week will have hamburger (no cheese) on whole wheat bread or hamburger steak with muschrooms and onions. Pt reports watching sodium, limited fried food (every now and again will have fried chicken), lemonade (no cal, no sugar - splenda instead). D: vegetable soup (beans and lots of leafy greens). Will eat poultry and shrimp and scallops.      Intervention Plan   Intervention  Prescribe, educate and counsel regarding individualized specific dietary modifications aiming towards targeted core components such as weight, hypertension, lipid management, diabetes, heart failure and other comorbidities.;Nutrition handout(s) given to patient.   emailed pt handout   Expected Outcomes   Short Term Goal: Understand  basic principles of dietary content, such as calories, fat, sodium, cholesterol and nutrients.;Short Term Goal: A plan has been developed with personal nutrition goals set during dietitian appointment.;Long Term Goal: Adherence to prescribed nutrition plan.       Nutrition Assessments: Nutrition Assessments - 05/29/19 1445      MEDFICTS Scores   Pre Score  12       Nutrition Goals Re-Evaluation: Nutrition Goals Re-Evaluation    Row Name 06/11/19 1111 07/16/19 1120 08/20/19 1140 09/17/19 1130       Goals   Nutrition Goal  ST:  B can add soymilk instead of almondmilk or add hempheart or ground flax LT: maintain wt, keep HgA1c down  ST:/ LT: continue HH eating maintain wt, keep HgA1c down  ST:/ LT: continue HH eating maintain wt, keep HgA1c down  ST:/ LT: continue HH eating maintain wt, keep HgA1c down    Comment  B: Non-fat creamer & splenda; lost appetite for coffee in the hospital, now drinks decaf 2cups. B: fruits and/or cereal (cheerios) with fruit and almond milk. L: low CHO beer, (1/2 [place] sandwich w/ a bit of lite mayo and lettuce and tomato) and whole wheat pasta with zoodles and sauce. 2-3x/week will have hamburger (no cheese) on whole wheat bread or hamburger steak with muschrooms and onions. Pt reports watching sodium, limited fried food (every now and again will have fried chicken), lemonade (no cal, no sugar - splenda instead). D: vegetable soup (beans and lots of leafy greens). Will eat poultry and shrimp and scallops. Pt mixing lemonade with water. Pt reports not eating as much as usual due to low appetite from surgery. Disucssed for B can add soymilk instead of almondmilk or add hempheart or ground flax for a nutrition boost (teeth are not great so doesn't eat nuts).  Pt reports doing well, choosing healthier choices when out like fish or hamburger steak with vegetables and sometimes a baked potato, but does eat much of it. Pt reports still eating the  same way since we last spoke and that he is doing well. Pt rpeorts not needing anything from the RD at this time.  Continue with current changes  Continue with current changes    Expected Outcome  ST:  B can add soymilk instead of almondmilk or add hempheart or ground flax LT: maintain wt, keep HgA1c down  ST:/ LT: continue HH eating maintain wt, keep HgA1c down  ST:/ LT: continue HH eating maintain wt, keep HgA1c down  ST:/ LT: continue HH eating maintain wt, keep HgA1c down       Nutrition Goals Discharge (Final Nutrition Goals Re-Evaluation): Nutrition Goals Re-Evaluation - 09/17/19 1130      Goals   Nutrition Goal  ST:/ LT: continue HH eating maintain wt, keep HgA1c down    Comment  Continue with current changes    Expected Outcome  ST:/ LT: continue HH eating maintain wt, keep HgA1c down       Psychosocial: Target Goals: Acknowledge presence or absence of significant depression and/or stress, maximize coping skills, provide positive support system. Participant is able to verbalize types and ability to use techniques and skills needed for reducing stress and depression.   Initial Review & Psychosocial Screening: Initial Psych Review & Screening - 05/28/19 1343      Initial Review   Current issues with  None Identified   Is taking Klonopin after several hospitalizations.  Med prescribed after all this and he feels he is doing good  Family Dynamics   Good Support System?  Yes   Wife     Barriers   Psychosocial barriers to participate in program  There are no identifiable barriers or psychosocial needs.;The patient should benefit from training in stress management and relaxation.      Screening Interventions   Interventions  Encouraged to exercise;Provide feedback about the scores to participant;To provide support and resources with identified psychosocial needs    Expected Outcomes  Short Term goal: Utilizing psychosocial counselor, staff and physician to assist with  identification of specific Stressors or current issues interfering with healing process. Setting desired goal for each stressor or current issue identified.;Long Term Goal: Stressors or current issues are controlled or eliminated.;Short Term goal: Identification and review with participant of any Quality of Life or Depression concerns found by scoring the questionnaire.;Long Term goal: The participant improves quality of Life and PHQ9 Scores as seen by post scores and/or verbalization of changes       Quality of Life Scores:  Quality of Life - 05/29/19 1442      Quality of Life   Select  Quality of Life      Quality of Life Scores   Health/Function Pre  8.6 %    Socioeconomic Pre  26.43 %    Psych/Spiritual Pre  15.57 %    Family Pre  25.2 %    GLOBAL Pre  16.44 %      Scores of 19 and below usually indicate a poorer quality of life in these areas.  A difference of  2-3 points is a clinically meaningful difference.  A difference of 2-3 points in the total score of the Quality of Life Index has been associated with significant improvement in overall quality of life, self-image, physical symptoms, and general health in studies assessing change in quality of life.  PHQ-9: Recent Review Flowsheet Data    Depression screen [facility] 2/9 07/02/2019 05/31/2019   Decreased Interest 0 0   Down, Depressed, Hopeless 0 1   PHQ - 2 Score 0 1   Altered sleeping 1 3   Tired, decreased energy 1 3   Change in appetite 0 0   Feeling bad or failure about yourself  0 0   Trouble concentrating 0 0   Moving slowly or fidgety/restless 1 0   Suicidal thoughts 0 0   PHQ-9 Score 3 7   Difficult doing work/chores Not difficult at all Somewhat difficult     Interpretation of Total Score  Total Score Depression Severity:  1-4 = Minimal depression, 5-9 = Mild depression, 10-14 = Moderate depression, 15-19 = Moderately severe depression, 20-27 = Severe depression   Psychosocial Evaluation and  Intervention:   Psychosocial Re-Evaluation: Psychosocial Re-Evaluation    [place] Name 06/13/19 1109 07/11/19 1107 08/01/19 1100 09/03/19 1129       Psychosocial Re-Evaluation   Current issues with  Current Stress Concerns  -  Current Anxiety/Panic  -    Comments  Hershell is doing well in rehab.  He is off to a good start.  He feels good mentally and tries not to let too many things get to him.  His health is his biggest issue.  He sleep pretty well.  Kaelan wakes up sometimes and cant go back to sleep.  He will talk to his Dr about this.  He does practice slow deep breathing when needed.  Patient states he takes Klonopin every morning to help with his anxiety. He says his anxiety sometimes causes his  angina.  Daichi doesnt feel stressed at this time.  He works part time and enjoys his work.    Expected Outcomes  Short: Exericse for boost.  Long: Continue to stay positive.  Short - continue to practice stress redution Long - manage stress and sleep  Short: Attend HeartTrack stress management education to decrease stress. Long: Maintain exercise Post HeartTrack to keep stress at a minimum  Short - continue to exercise regularly Long - manage stress and anxiety    Interventions  Encouraged to attend Cardiac Rehabilitation for the exercise  -  Encouraged to attend Cardiac Rehabilitation for the exercise  -    Continue Psychosocial Services   Follow up required by staff  -  Follow up required by staff  -       Psychosocial Discharge (Final Psychosocial Re-Evaluation): Psychosocial Re-Evaluation - 09/03/19 1129      Psychosocial Re-Evaluation   Comments  Cullen doesnt feel stressed at this time.  He works part time and enjoys his work.    Expected Outcomes  Short - continue to exercise regularly Long - manage stress and anxiety       Vocational Rehabilitation: Provide vocational rehab assistance to qualifying candidates.   Vocational Rehab Evaluation & Intervention: Vocational Rehab - 05/28/19 1348       Initial Vocational Rehab Evaluation & Intervention   Assessment shows need for Vocational Rehabilitation  No       Education: Education Goals: Education classes will be provided on a variety of topics geared toward better understanding of heart health and risk factor modification. Participant will state understanding/return demonstration of topics presented as noted by education test scores.  Learning Barriers/Preferences:   Education Topics:  AED/CPR: - Group verbal and written instruction with the use of models to demonstrate the basic use of the AED with the basic ABC's of resuscitation.   General Nutrition Guidelines/Fats and Fiber: -Group instruction provided by verbal, written material, models and posters to present the general guidelines for heart healthy nutrition. Gives an explanation and review of dietary fats and fiber.   Cardiac Rehab from 09/19/2019 in [facility] Cardiac and Pulmonary Rehab  Date  09/05/19  Educator  [facility]  Instruction Review Code  1- Verbalizes Understanding      Controlling Sodium/Reading Food Labels: -Group verbal and written material supporting the discussion of sodium use in heart healthy nutrition. Review and explanation with models, verbal and written materials for utilization of the food label.   Exercise Physiology & General Exercise Guidelines: - Group verbal and written instruction with models to review the exercise physiology of the cardiovascular system and associated critical values. Provides general exercise guidelines with specific guidelines to those with heart or lung disease.    Aerobic Exercise & Resistance Training: - Gives group verbal and written instruction on the various components of exercise. Focuses on aerobic and resistive training programs and the benefits of this training and how to safely progress through these programs..   Cardiac Rehab from 09/19/2019 in [facility] Cardiac and Pulmonary Rehab  Date  08/22/19  Educator  jh   Instruction Review Code  1- Verbalizes Understanding      Flexibility, Balance, Mind/Body Relaxation: Provides group verbal/written instruction on the benefits of flexibility and balance training, including mind/body exercise modes such as yoga, pilates and tai chi.  Demonstration and skill practice provided.   Cardiac Rehab from 09/19/2019 in [facility] Cardiac and Pulmonary Rehab  Date  09/05/19 [Balance 12/3]  Educator  [facility]  Instruction Review Code  1- Verbalizes Understanding      Stress and Anxiety: - Provides group verbal and written instruction about the health risks of elevated stress and causes of high stress.  Discuss the correlation between heart/lung disease and anxiety and treatment options. Review healthy ways to manage with stress and anxiety.   Depression: - Provides group verbal and written instruction on the correlation between heart/lung disease and depressed mood, treatment options, and the stigmas associated with seeking treatment.   Anatomy & Physiology of the Heart: - Group verbal and written instruction and models provide basic cardiac anatomy and physiology, with the coronary electrical and arterial systems. Review of Valvular disease and Heart Failure   Cardiac Procedures: - Group verbal and written instruction to review commonly prescribed medications for heart disease. Reviews the medication, class of the drug, and side effects. Includes the steps to properly store meds and maintain the prescription regimen. (beta blockers and nitrates)   Cardiac Rehab from 09/19/2019 in [facility] Cardiac and Pulmonary Rehab  Date  08/22/19  Educator  mc  Instruction Review Code  1- Verbalizes Understanding      Cardiac Medications I: - Group verbal and written instruction to review commonly prescribed medications for heart disease. Reviews the medication, class of the drug, and side effects. Includes the steps to properly store meds and maintain the prescription  regimen.   Cardiac Medications II: -Group verbal and written instruction to review commonly prescribed medications for heart disease. Reviews the medication, class of the drug, and side effects. (all other drug classes)   Cardiac Rehab from 08/08/2019 in [facility] Cardiac and Pulmonary Rehab  Date  08/08/19  Educator  [facility]  Instruction Review Code  1- Verbalizes Understanding       Go Sex-Intimacy & Heart Disease, Get SMART - Goal Setting: - Group verbal and written instruction through game format to discuss heart disease and the return to sexual intimacy. Provides group verbal and written material to discuss and apply goal setting through the application of the S.M.A.R.T. Method.   Cardiac Rehab from 09/19/2019 in [facility] Cardiac and Pulmonary Rehab  Date  08/22/19  Educator  mc  Instruction Review Code  1- Verbalizes Understanding      Other Matters of the Heart: - Provides group verbal, written materials and models to describe Stable Angina and Peripheral Artery. Includes description of the disease process and treatment options available to the cardiac patient.   Exercise & Equipment Safety: - Individual verbal instruction and demonstration of equipment use and safety with use of the equipment.   Cardiac Rehab from 05/31/2019 in [facility] Cardiac and Pulmonary Rehab  Date  05/31/19  Educator  [facility]  Instruction Review Code  1- Verbalizes Understanding      Infection Prevention: - Provides verbal and written material to individual with discussion of infection control including proper hand washing and proper equipment cleaning during exercise session.   Cardiac Rehab from 05/31/2019 in [facility] Cardiac and Pulmonary Rehab  Date  05/31/19  Educator  [facility]  Instruction Review Code  1- Verbalizes Understanding      Falls Prevention: - Provides verbal and written material to individual with discussion of falls prevention and safety.   Cardiac Rehab from 05/31/2019 in [facility] Cardiac and Pulmonary Rehab   Date  05/31/19  Educator  [facility]  Instruction Review Code  1- Verbalizes Understanding      Diabetes: - Individual verbal and written instruction to review signs/symptoms of diabetes, desired ranges of glucose level fasting, after meals and with  exercise. Acknowledge that pre and post exercise glucose checks will be done for 3 sessions at entry of program.   Know Your Numbers and Risk Factors: -Group verbal and written instruction about important numbers in your health.  Discussion of what are risk factors and how they play a role in the disease process.  Review of Cholesterol, Blood Pressure, Diabetes, and BMI and the role they play in your overall health.   Cardiac Rehab from 08/08/2019 in [facility] Cardiac and Pulmonary Rehab  Date  08/08/19  Educator  [facility]  Instruction Review Code  1- Verbalizes Understanding      Sleep Hygiene: -Provides group verbal and written instruction about how sleep can affect your health.  Define sleep hygiene, discuss sleep cycles and impact of sleep habits. Review good sleep hygiene tips.    Other: -Provides group and verbal instruction on various topics (see comments)   Knowledge Questionnaire Score: Knowledge Questionnaire Score - 05/29/19 1445      Knowledge Questionnaire Score   Pre Score  23/26   Education Focus: Nutrition and Exercise      Core Components/Risk Factors/Patient Goals at Admission: Personal Goals and Risk Factors at Admission - 05/31/19 1242      Core Components/Risk Factors/Patient Goals on Admission    Weight Management  Yes;Weight Loss    Intervention  Weight Management: Develop a combined nutrition and exercise program designed to reach desired caloric intake, while maintaining appropriate intake of nutrient and fiber, sodium and fats, and appropriate energy expenditure required for the weight goal.;Weight Management: Provide education and appropriate resources to help participant work on and attain dietary goals.    Admit  Weight  188 lb 8 oz (85.5 kg)    Goal Weight: Short Term  183 lb (83 kg)    Goal Weight: Long Term  180 lb (81.6 kg)    Expected Outcomes  Short Term: Continue to assess and modify interventions until short term weight is achieved;Long Term: Adherence to nutrition and physical activity/exercise program aimed toward attainment of established weight goal;Weight Loss: Understanding of general recommendations for a balanced deficit meal plan, which promotes 1-2 lb weight loss per week and includes a negative energy balance of [phone] kcal/d;Understanding recommendations for meals to include 15-35% energy as protein, 25-35% energy from fat, 35-60% energy from carbohydrates, less than 241m of dietary cholesterol, 20-35 gm of total fiber daily;Understanding of distribution of calorie intake throughout the day with the consumption of 4-5 meals/snacks    Diabetes  Yes    Intervention  Provide education about signs/symptoms and action to take for hypo/hyperglycemia.;Provide education about proper nutrition, including hydration, and aerobic/resistive exercise prescription along with prescribed medications to achieve blood glucose in normal ranges: Fasting glucose 65-99 mg/dL    Expected Outcomes  Short Term: Participant verbalizes understanding of the signs/symptoms and immediate care of hyper/hypoglycemia, proper foot care and importance of medication, aerobic/resistive exercise and nutrition plan for blood glucose control.;Long Term: Attainment of HbA1C < 7%.    Hypertension  Yes    Intervention  Provide education on lifestyle modifcations including regular physical activity/exercise, weight management, moderate sodium restriction and increased consumption of fresh fruit, vegetables, and low fat dairy, alcohol moderation, and smoking cessation.;Monitor prescription use compliance.    Expected Outcomes  Short Term: Continued assessment and intervention until BP is < 140/957mHG in hypertensive participants. <  130/809mG in hypertensive participants with diabetes, heart failure or chronic kidney disease.;Long Term: Maintenance of blood pressure at goal levels.  Lipids  Yes    Intervention  Provide education and support for participant on nutrition & aerobic/resistive exercise along with prescribed medications to achieve LDL <39m, HDL >420m    Expected Outcomes  Short Term: Participant states understanding of desired cholesterol values and is compliant with medications prescribed. Participant is following exercise prescription and nutrition guidelines.;Long Term: Cholesterol controlled with medications as prescribed, with individualized exercise RX and with personalized nutrition plan. Value goals: LDL < 7080mHDL > 40 mg.       Core Components/Risk Factors/Patient Goals Review:  Goals and Risk Factor Review    Row Name 06/13/19 1110 07/11/19 1102 08/01/19 1103 09/03/19 1110       Core Components/Risk Factors/Patient Goals Review   Personal Goals Review  Weight Management/Obesity;Diabetes;Hypertension  -  Weight Management/Obesity;Diabetes;Hypertension  Weight Management/Obesity;Hypertension;Diabetes    Review  GarDixon doing well in rehab. His weight is staying steady thanks to a good diet. He has gotten away from fried foods altogether and doesn't miss them.  His blood sugars have gotten better with the improved diet. He checks them about once a week at home and more if it feels high.  Pressures have been good generally.  He had a high day on Tuesday, but much better yesterday.  He had [place] leg yesterday and talked to doctor about it and flow is at 105%.  GarCarlaill has chest pain with exercise but it is improving.  GarChaysew his Dr last week and he is on a 24 hour nitro - his Dr says his work in therapy helped him improve so he could take the long acting medicine.  GarCashawns taken less regular nitro since starting the long acting.  BG has still been good.  Patient checks his sugar once a week since he  does not take any medicine for it. He watches what he eats and has not been eating as many oatmeal cookies. He has pretty much stopped eating fast food all together. His resting blood pressure has been 120's over 60's. He is managing his diabetes with diet and exercise. GarMuneernts his weight to be 185 pounds.  GarKeyion taking heart meds as directed - sometimes forgets glaucoma meds.  He has an oatmeal cookie every other day.  His BG has been great.  He usually avoids fried food but had some hushpuppies this weekend.  He has not needed nitro recently.    Expected Outcomes  Short; Continue to work on weight loss.  Long: Continue to monitor risk factors.  Short - continue current deitary changes Long - maintain heart healthy lifestyle  Short: lose 5 pounds in two weeks. Long: get down to 185 pounds.  Short - eat more fruits and vegetables Long - reach goal weight       Core Components/Risk Factors/Patient Goals at Discharge (Final Review):  Goals and Risk Factor Review - 09/03/19 1110      Core Components/Risk Factors/Patient Goals Review   Personal Goals Review  Weight Management/Obesity;Hypertension;Diabetes    Review  GarEarland taking heart meds as directed - sometimes forgets glaucoma meds.  He has an oatmeal cookie every other day.  His BG has been great.  He usually avoids fried food but had some hushpuppies this weekend.  He has not needed nitro recently.    Expected Outcomes  Short - eat more fruits and vegetables Long - reach goal weight       ITP Comments: ITP Comments    Row Name 04/15/19  1304 04/30/19 1335 05/27/19 1336 05/28/19 1400 05/31/19 1222   ITP Comments  Completed initial ExRx created and sent to Dr. Emily Filbert, Medical Director to review and sign.  Completed Virtual Visit.  Jabarie is still having chest pain and BP issues.  He has a follow up appt tomorrow and will call afterwards with an update.  See progress notes for 04/30/19.  [place] called and has been cleared to start rehab. He is  down to 3-4 NTG a day versus 8-9.  His doctor would like for him to go ahead and get started with the program.  Virtual Orientation to program completed today.  Documentation of diagnosis can be found in [facility] 4/15  Completed 6MWT and gym orientation. Initial ITP created and sent for review to Dr. Emily Filbert, Medical Director.   [place] Name 06/06/19 1137 06/26/19 0545 07/24/19 1337 08/21/19 0611 09/18/19 0923   ITP Comments  Continues to experience angina symptoms. Has been able to stop, rest and symptoms resolve.  30 Day review. Continue with ITP unless directed changes per Medical Director review.  30 day review completed. ITP sent to Dr. Emily Filbert, Medical Director of Cardiac and Pulmonary Rehab. Continue with ITP unless changes are made by physician.  Department closed starting 10/2 until further notice by infection prevention and Health at Work teams for COVID-19.  30 day review completed. Continue with ITP sent to Dr. Emily Filbert, Medical Director of Cardiac and Pulmonary Rehab for review , changes as needed and signature.  30 day review competed . ITP sent to Dr Emily Filbert for review, changes as needed and ITP approval signature.      Comments: Discharge ITP

## 2019-10-23 DIAGNOSIS — H903 Sensorineural hearing loss, bilateral: Secondary | ICD-10-CM | POA: Diagnosis not present

## 2019-11-12 ENCOUNTER — Ambulatory Visit: Payer: Self-pay | Admitting: Physical Therapy

## 2019-11-14 ENCOUNTER — Ambulatory Visit: Payer: Self-pay

## 2019-11-19 ENCOUNTER — Ambulatory Visit: Payer: Self-pay | Admitting: Physical Therapy

## 2019-11-21 ENCOUNTER — Ambulatory Visit: Payer: Self-pay | Admitting: Physical Therapy

## 2019-11-26 ENCOUNTER — Ambulatory Visit: Payer: Self-pay | Admitting: Physical Therapy

## 2019-11-28 ENCOUNTER — Ambulatory Visit: Payer: Self-pay

## 2019-12-03 ENCOUNTER — Ambulatory Visit: Payer: Self-pay | Admitting: Physical Therapy

## 2019-12-05 ENCOUNTER — Ambulatory Visit: Payer: Self-pay | Admitting: Physical Therapy

## 2019-12-10 DIAGNOSIS — E119 Type 2 diabetes mellitus without complications: Secondary | ICD-10-CM | POA: Diagnosis not present

## 2019-12-10 DIAGNOSIS — I10 Essential (primary) hypertension: Secondary | ICD-10-CM | POA: Diagnosis not present

## 2019-12-10 DIAGNOSIS — I2510 Atherosclerotic heart disease of native coronary artery without angina pectoris: Secondary | ICD-10-CM | POA: Diagnosis not present

## 2019-12-10 DIAGNOSIS — Z79899 Other long term (current) drug therapy: Secondary | ICD-10-CM | POA: Diagnosis not present

## 2019-12-10 DIAGNOSIS — M792 Neuralgia and neuritis, unspecified: Secondary | ICD-10-CM | POA: Diagnosis not present

## 2020-01-14 DIAGNOSIS — I951 Orthostatic hypotension: Secondary | ICD-10-CM | POA: Diagnosis not present

## 2020-01-14 DIAGNOSIS — E782 Mixed hyperlipidemia: Secondary | ICD-10-CM | POA: Diagnosis not present

## 2020-01-14 DIAGNOSIS — I5023 Acute on chronic systolic (congestive) heart failure: Secondary | ICD-10-CM | POA: Diagnosis not present

## 2020-01-14 DIAGNOSIS — I2510 Atherosclerotic heart disease of native coronary artery without angina pectoris: Secondary | ICD-10-CM | POA: Diagnosis not present

## 2020-01-14 DIAGNOSIS — I10 Essential (primary) hypertension: Secondary | ICD-10-CM | POA: Diagnosis not present

## 2020-01-14 DIAGNOSIS — R0600 Dyspnea, unspecified: Secondary | ICD-10-CM | POA: Diagnosis not present

## 2020-02-12 DIAGNOSIS — M79606 Pain in leg, unspecified: Secondary | ICD-10-CM | POA: Diagnosis not present

## 2020-02-12 DIAGNOSIS — I998 Other disorder of circulatory system: Secondary | ICD-10-CM | POA: Diagnosis not present

## 2020-02-12 DIAGNOSIS — I10 Essential (primary) hypertension: Secondary | ICD-10-CM | POA: Diagnosis not present

## 2020-02-12 DIAGNOSIS — M48061 Spinal stenosis, lumbar region without neurogenic claudication: Secondary | ICD-10-CM | POA: Diagnosis not present

## 2020-02-12 DIAGNOSIS — I739 Peripheral vascular disease, unspecified: Secondary | ICD-10-CM | POA: Diagnosis not present

## 2020-02-12 DIAGNOSIS — E782 Mixed hyperlipidemia: Secondary | ICD-10-CM | POA: Diagnosis not present

## 2020-02-12 DIAGNOSIS — Z951 Presence of aortocoronary bypass graft: Secondary | ICD-10-CM | POA: Diagnosis not present

## 2020-02-12 DIAGNOSIS — I214 Non-ST elevation (NSTEMI) myocardial infarction: Secondary | ICD-10-CM | POA: Diagnosis not present

## 2020-03-02 DIAGNOSIS — I739 Peripheral vascular disease, unspecified: Secondary | ICD-10-CM | POA: Diagnosis not present

## 2020-03-10 DIAGNOSIS — M792 Neuralgia and neuritis, unspecified: Secondary | ICD-10-CM | POA: Diagnosis not present

## 2020-03-10 DIAGNOSIS — E119 Type 2 diabetes mellitus without complications: Secondary | ICD-10-CM | POA: Diagnosis not present

## 2020-03-10 DIAGNOSIS — G8929 Other chronic pain: Secondary | ICD-10-CM | POA: Diagnosis not present

## 2020-03-10 DIAGNOSIS — Z9181 History of falling: Secondary | ICD-10-CM | POA: Diagnosis not present

## 2020-03-10 DIAGNOSIS — M545 Low back pain: Secondary | ICD-10-CM | POA: Diagnosis not present

## 2020-03-10 DIAGNOSIS — Z7409 Other reduced mobility: Secondary | ICD-10-CM | POA: Diagnosis not present

## 2020-03-19 ENCOUNTER — Ambulatory Visit: Payer: BC Managed Care – PPO | Admitting: Student in an Organized Health Care Education/Training Program

## 2020-03-19 DIAGNOSIS — D1621 Benign neoplasm of long bones of right lower limb: Secondary | ICD-10-CM | POA: Diagnosis not present

## 2020-04-02 DIAGNOSIS — M898X5 Other specified disorders of bone, thigh: Secondary | ICD-10-CM | POA: Diagnosis not present

## 2020-04-02 DIAGNOSIS — M25559 Pain in unspecified hip: Secondary | ICD-10-CM | POA: Diagnosis not present

## 2020-04-02 DIAGNOSIS — D1621 Benign neoplasm of long bones of right lower limb: Secondary | ICD-10-CM | POA: Diagnosis not present

## 2020-04-09 ENCOUNTER — Ambulatory Visit: Payer: BC Managed Care – PPO | Admitting: Student in an Organized Health Care Education/Training Program

## 2020-04-23 DIAGNOSIS — I2609 Other pulmonary embolism with acute cor pulmonale: Secondary | ICD-10-CM | POA: Diagnosis not present

## 2020-04-23 DIAGNOSIS — Z8674 Personal history of sudden cardiac arrest: Secondary | ICD-10-CM | POA: Diagnosis not present

## 2020-04-23 DIAGNOSIS — Z951 Presence of aortocoronary bypass graft: Secondary | ICD-10-CM | POA: Diagnosis not present

## 2020-04-23 DIAGNOSIS — E119 Type 2 diabetes mellitus without complications: Secondary | ICD-10-CM | POA: Diagnosis not present

## 2020-04-23 DIAGNOSIS — I214 Non-ST elevation (NSTEMI) myocardial infarction: Secondary | ICD-10-CM | POA: Diagnosis not present

## 2020-04-23 DIAGNOSIS — I2510 Atherosclerotic heart disease of native coronary artery without angina pectoris: Secondary | ICD-10-CM | POA: Diagnosis not present

## 2020-04-23 DIAGNOSIS — Z7901 Long term (current) use of anticoagulants: Secondary | ICD-10-CM | POA: Diagnosis not present

## 2020-04-23 DIAGNOSIS — I10 Essential (primary) hypertension: Secondary | ICD-10-CM | POA: Diagnosis not present

## 2020-04-23 DIAGNOSIS — I998 Other disorder of circulatory system: Secondary | ICD-10-CM | POA: Diagnosis not present

## 2020-04-23 DIAGNOSIS — E854 Organ-limited amyloidosis: Secondary | ICD-10-CM | POA: Diagnosis not present

## 2020-04-23 DIAGNOSIS — M79606 Pain in leg, unspecified: Secondary | ICD-10-CM | POA: Diagnosis not present

## 2020-04-23 DIAGNOSIS — K219 Gastro-esophageal reflux disease without esophagitis: Secondary | ICD-10-CM | POA: Diagnosis not present

## 2020-04-23 DIAGNOSIS — Z01818 Encounter for other preprocedural examination: Secondary | ICD-10-CM | POA: Diagnosis not present

## 2020-04-23 DIAGNOSIS — I5022 Chronic systolic (congestive) heart failure: Secondary | ICD-10-CM | POA: Diagnosis not present

## 2020-04-23 DIAGNOSIS — N400 Enlarged prostate without lower urinary tract symptoms: Secondary | ICD-10-CM | POA: Diagnosis not present

## 2020-04-23 DIAGNOSIS — I680 Cerebral amyloid angiopathy: Secondary | ICD-10-CM | POA: Diagnosis not present

## 2020-04-28 DIAGNOSIS — Z20822 Contact with and (suspected) exposure to covid-19: Secondary | ICD-10-CM | POA: Diagnosis not present

## 2020-05-01 DIAGNOSIS — I259 Chronic ischemic heart disease, unspecified: Secondary | ICD-10-CM | POA: Diagnosis not present

## 2020-05-01 DIAGNOSIS — R9431 Abnormal electrocardiogram [ECG] [EKG]: Secondary | ICD-10-CM | POA: Diagnosis not present

## 2020-05-01 DIAGNOSIS — S79911A Unspecified injury of right hip, initial encounter: Secondary | ICD-10-CM | POA: Diagnosis not present

## 2020-05-01 DIAGNOSIS — I4510 Unspecified right bundle-branch block: Secondary | ICD-10-CM | POA: Diagnosis not present

## 2020-05-01 DIAGNOSIS — M1990 Unspecified osteoarthritis, unspecified site: Secondary | ICD-10-CM | POA: Diagnosis not present

## 2020-05-01 DIAGNOSIS — Z79899 Other long term (current) drug therapy: Secondary | ICD-10-CM | POA: Diagnosis not present

## 2020-05-01 DIAGNOSIS — I25119 Atherosclerotic heart disease of native coronary artery with unspecified angina pectoris: Secondary | ICD-10-CM | POA: Diagnosis not present

## 2020-05-01 DIAGNOSIS — I209 Angina pectoris, unspecified: Secondary | ICD-10-CM | POA: Diagnosis not present

## 2020-05-01 DIAGNOSIS — D1621 Benign neoplasm of long bones of right lower limb: Secondary | ICD-10-CM | POA: Diagnosis not present

## 2020-05-01 DIAGNOSIS — I10 Essential (primary) hypertension: Secondary | ICD-10-CM | POA: Diagnosis not present

## 2020-05-01 DIAGNOSIS — D62 Acute posthemorrhagic anemia: Secondary | ICD-10-CM | POA: Diagnosis not present

## 2020-05-01 DIAGNOSIS — Z86711 Personal history of pulmonary embolism: Secondary | ICD-10-CM | POA: Diagnosis not present

## 2020-05-01 DIAGNOSIS — H409 Unspecified glaucoma: Secondary | ICD-10-CM | POA: Diagnosis not present

## 2020-05-01 DIAGNOSIS — I959 Hypotension, unspecified: Secondary | ICD-10-CM | POA: Diagnosis not present

## 2020-05-01 DIAGNOSIS — E876 Hypokalemia: Secondary | ICD-10-CM | POA: Diagnosis not present

## 2020-05-01 DIAGNOSIS — K219 Gastro-esophageal reflux disease without esophagitis: Secondary | ICD-10-CM | POA: Diagnosis not present

## 2020-05-01 DIAGNOSIS — I9581 Postprocedural hypotension: Secondary | ICD-10-CM | POA: Diagnosis not present

## 2020-05-01 DIAGNOSIS — F419 Anxiety disorder, unspecified: Secondary | ICD-10-CM | POA: Diagnosis not present

## 2020-05-01 DIAGNOSIS — Z7901 Long term (current) use of anticoagulants: Secondary | ICD-10-CM | POA: Diagnosis not present

## 2020-05-01 DIAGNOSIS — I519 Heart disease, unspecified: Secondary | ICD-10-CM | POA: Diagnosis not present

## 2020-05-01 DIAGNOSIS — R079 Chest pain, unspecified: Secondary | ICD-10-CM | POA: Diagnosis not present

## 2020-05-01 DIAGNOSIS — E1165 Type 2 diabetes mellitus with hyperglycemia: Secondary | ICD-10-CM | POA: Diagnosis not present

## 2020-05-01 DIAGNOSIS — I4581 Long QT syndrome: Secondary | ICD-10-CM | POA: Diagnosis not present

## 2020-05-07 DIAGNOSIS — Z7901 Long term (current) use of anticoagulants: Secondary | ICD-10-CM | POA: Diagnosis not present

## 2020-05-07 DIAGNOSIS — I951 Orthostatic hypotension: Secondary | ICD-10-CM | POA: Diagnosis not present

## 2020-05-07 DIAGNOSIS — K589 Irritable bowel syndrome without diarrhea: Secondary | ICD-10-CM | POA: Diagnosis not present

## 2020-05-07 DIAGNOSIS — I5023 Acute on chronic systolic (congestive) heart failure: Secondary | ICD-10-CM | POA: Diagnosis not present

## 2020-05-08 DIAGNOSIS — I5023 Acute on chronic systolic (congestive) heart failure: Secondary | ICD-10-CM | POA: Diagnosis not present

## 2020-05-08 DIAGNOSIS — Z7901 Long term (current) use of anticoagulants: Secondary | ICD-10-CM | POA: Diagnosis not present

## 2020-05-12 DIAGNOSIS — I5023 Acute on chronic systolic (congestive) heart failure: Secondary | ICD-10-CM | POA: Diagnosis not present

## 2020-05-12 DIAGNOSIS — R0600 Dyspnea, unspecified: Secondary | ICD-10-CM | POA: Diagnosis not present

## 2020-05-12 DIAGNOSIS — I951 Orthostatic hypotension: Secondary | ICD-10-CM | POA: Diagnosis not present

## 2020-05-12 DIAGNOSIS — Z86711 Personal history of pulmonary embolism: Secondary | ICD-10-CM | POA: Diagnosis not present

## 2020-05-12 DIAGNOSIS — I25118 Atherosclerotic heart disease of native coronary artery with other forms of angina pectoris: Secondary | ICD-10-CM | POA: Diagnosis not present

## 2020-05-12 DIAGNOSIS — D509 Iron deficiency anemia, unspecified: Secondary | ICD-10-CM | POA: Diagnosis not present

## 2020-05-15 DIAGNOSIS — D1621 Benign neoplasm of long bones of right lower limb: Secondary | ICD-10-CM | POA: Diagnosis not present

## 2020-06-10 DIAGNOSIS — Z79899 Other long term (current) drug therapy: Secondary | ICD-10-CM | POA: Diagnosis not present

## 2020-06-10 DIAGNOSIS — E119 Type 2 diabetes mellitus without complications: Secondary | ICD-10-CM | POA: Diagnosis not present

## 2020-06-10 DIAGNOSIS — M545 Low back pain: Secondary | ICD-10-CM | POA: Diagnosis not present

## 2020-06-10 DIAGNOSIS — Z139 Encounter for screening, unspecified: Secondary | ICD-10-CM | POA: Diagnosis not present

## 2020-06-10 DIAGNOSIS — I2510 Atherosclerotic heart disease of native coronary artery without angina pectoris: Secondary | ICD-10-CM | POA: Diagnosis not present

## 2020-06-10 DIAGNOSIS — M792 Neuralgia and neuritis, unspecified: Secondary | ICD-10-CM | POA: Diagnosis not present

## 2020-06-10 DIAGNOSIS — G8929 Other chronic pain: Secondary | ICD-10-CM | POA: Diagnosis not present

## 2020-06-25 DIAGNOSIS — I951 Orthostatic hypotension: Secondary | ICD-10-CM | POA: Diagnosis not present

## 2020-06-25 DIAGNOSIS — I25118 Atherosclerotic heart disease of native coronary artery with other forms of angina pectoris: Secondary | ICD-10-CM | POA: Diagnosis not present

## 2020-06-25 DIAGNOSIS — D1621 Benign neoplasm of long bones of right lower limb: Secondary | ICD-10-CM | POA: Diagnosis not present

## 2020-06-25 DIAGNOSIS — E782 Mixed hyperlipidemia: Secondary | ICD-10-CM | POA: Diagnosis not present

## 2020-06-25 DIAGNOSIS — I5022 Chronic systolic (congestive) heart failure: Secondary | ICD-10-CM | POA: Diagnosis not present

## 2020-07-09 DIAGNOSIS — E119 Type 2 diabetes mellitus without complications: Secondary | ICD-10-CM | POA: Diagnosis not present

## 2020-07-09 DIAGNOSIS — R609 Edema, unspecified: Secondary | ICD-10-CM | POA: Diagnosis not present

## 2020-07-09 DIAGNOSIS — I739 Peripheral vascular disease, unspecified: Secondary | ICD-10-CM | POA: Diagnosis not present

## 2020-07-09 DIAGNOSIS — I82401 Acute embolism and thrombosis of unspecified deep veins of right lower extremity: Secondary | ICD-10-CM | POA: Diagnosis not present

## 2020-07-16 ENCOUNTER — Ambulatory Visit: Payer: BC Managed Care – PPO | Attending: Orthopedic Surgery

## 2020-07-16 ENCOUNTER — Other Ambulatory Visit: Payer: Self-pay

## 2020-07-16 DIAGNOSIS — R262 Difficulty in walking, not elsewhere classified: Secondary | ICD-10-CM | POA: Diagnosis not present

## 2020-07-16 DIAGNOSIS — M25551 Pain in right hip: Secondary | ICD-10-CM | POA: Diagnosis not present

## 2020-07-16 DIAGNOSIS — M6281 Muscle weakness (generalized): Secondary | ICD-10-CM | POA: Diagnosis not present

## 2020-07-16 NOTE — Therapy (Signed)
[place] [facility] [address] [place], [address] Phone: [phone]   Fax:  [phone]  Physical Therapy Evaluation  Patient Details  Name: Damon English MRN: [id number] Date of Birth: [birth year] Referring Provider (PT): Charlcie Cradle, MD   Encounter Date: 07/16/2020   PT End of Session - 07/16/20 1350    Visit Number 1    Number of Visits 17    Date for PT Re-Evaluation 09/10/20    PT Start Time 1350    PT Stop Time 1443    PT Time Calculation (min) 53 min    Activity Tolerance Patient tolerated treatment well    Behavior During Therapy [facility] for tasks assessed/performed           Past Medical History:  Diagnosis Date  . Alcohol use    "cutting back" but still heavy and daily, multiple shots of bourbon each night  . Diabetes mellitus without complication ([place])   . History of tobacco use    reportedly quit in 2013  . Hypertension   . Pulmonary embolism ([place]) 2012   unprovoked  . PVD (peripheral vascular disease) ([place])     Past Surgical History:  Procedure Laterality Date  . BACK SURGERY    . CHOLECYSTECTOMY    . CORONARY ANGIOGRAPHY N/A 01/30/2019   Procedure: CORONARY ANGIOGRAPHY;  Surgeon: Corey Skains, MD;  Location: [facility];  Service: Cardiovascular;  Laterality: N/A;  . ERCP W/ SPHICTEROTOMY  2015  . FEMORAL ARTERY - POPLITEAL ARTERY BYPASS GRAFT Right 06/19/2018  . THROMBOENDARTERECTOMY Right 06/19/2018  . TONSILLECTOMY    . VASCULAR SURGERY      There were no vitals filed for this visit.    Subjective Assessment - 07/16/20 1352    Subjective R hip pain (lateral): 1/10 hip pain currently, 3/10 at worst for the past 1 month.    Pertinent History R femur osteochondroma. Had 2 heart attack this past May 2020. 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 Pt also has hx of vascular surgeries R LE which led to numbness.  Currently in PT to improve R hip  strength. Has numbness R medial thigh and R leg and foot secondary to vascular surgeries. Not as active as he used to be. 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  Pt used a rw after his surgery. Keeps a crutch in his truck just in case he needs it for long distances.  Had a really bad experience with a treadmill and does not want to use it. Pt fell and scared him. Able to ambulate without a crutch at home, about 50 ft to 100 ft. Stopped using his crutch about 1.5 weeks ago. Uses a rw to help him carry things. No falls within the last 6 months. No fear of falling. Has not had PT for his recent surgery.    Patient Stated Goals Walk further.    Currently in Pain? Yes    Pain Score 1     Pain Location Hip    Pain Orientation Right    Pain Type Surgical pain    Pain Onset More than a month ago    Pain Frequency Occasional              [facility] PT Assessment - 07/16/20 1410      Assessment   Medical  Diagnosis Osteochondroma of R femur    Referring Provider (PT) Charlcie Cradle, MD    Onset Date/Surgical Date 06/25/20    Prior Therapy No known PT for current condition      Precautions   Precaution Comments Hx of heart problems      Restrictions   Other Position/Activity Restrictions WBAT      Balance Screen   Has the patient fallen in the past 6 months No    Has the patient had a decrease in activity level because of a fear of falling?  No    Is the patient reluctant to leave their home because of a fear of falling?  No      Home Environment   Additional Comments Pt lives in a 1.5 story home with wife and 3 dogs. 12 steps to get to second floor, L rail. 3 steps to enter back door, R rail and a wall.       Prior Function   Vocation Full time employment    [employer]      Observation/Other Assessments   Observations Surgical incision healed satisfactorilly.  Very small scab left.       Posture/Postural Control   Posture Comments R hip in ER, forward neck, B protracted shoulders, R lateral shift, R foot forward, R knee in slight flexion, decreased R LE weight bearing.  Decreased B hip extension in standing.       Strength   Right Hip Flexion 4-/5    Right Hip Extension 4/5   seated manually resisted   Right Hip ABduction 4-/5    Left Hip Flexion 4-/5    Left Hip Extension 5/5   seated manually resisted   Left Hip ABduction --   Not checked secondary to R hip discomfort with pressure.    Right Knee Flexion 5/5    Right Knee Extension 5/5    Left Knee Flexion 5/5    Left Knee Extension 5/5      Palpation   Palpation comment increased muscle tension R vastus lateralis, Decreased scar tissue mobility.       Ambulation/Gait   Gait Comments Antalgic, decreased stance R LE.                       Objective measurements completed on examination: See above findings.   Pt states ok to perform CPR and resuscitate him and call ambulance if an emergency happens.        Has angina, has nitroglycerine pills Takes 2-3 nitroglycerine pills a day.    No latex allergies  Blood pressure L arm sitting, mechanically taken, normal cuff 154/58, HR 85 Pt states MD told him activity as tolerated for his R hip.    No AD  10 meter walk   12 seconds, 11 seconds  (0.87 m/s average)   TUG   12 seconds, 10 seconds, 9 seconds   Five times sit to stand   13 seconds   Observations: rest breaks needed secondary to fatigue and deconditioning.    Obtain FOTO score next visit  Response to treatment Pt tolerated session well without aggravation of symptoms.   Clinical impression  Patient is a 69 year old male who came to physical therapy S/P partial excision of bone in his R femur on 05/01/2020. Pt is currently 11 weeks post op. He also presents with hip weakness, altered gait pattern and posture, decreased scar tissue mobility,  muscle tension, decreased endurance and gait speed,  and difficulty performing standing tasks as well as ambulating long distances. Pt will benefit from skilled physical therapy to address the aforementinoed deficits.       PT Education - 07/16/20 2203    Education Details plan of care    Person(s) Educated Patient    Methods Explanation    Comprehension Verbalized understanding              PT Short Term Goals - 07/16/20 2213      PT SHORT TERM GOAL #1   Title Patient will be independent with his initial home exercise program to improve strength, function, and ability to ambulate longer distances.    Time 3    Period Weeks    Status New    Target Date 08/06/20             PT Long Term Goals - 07/16/20 2214      PT LONG TERM GOAL #1   Title Patient will improve R hip strength by at least 1/2 MMT grade to promote ability to ambulate and perform standing tasks with less difficulty.    Time 8    Period Weeks    Status New    Target Date 09/10/20      PT LONG TERM GOAL #2   Title Pt will improve his 10 meter walk speed to at least 1 meter/second without AD to promote community ambulation.    Baseline 0.87 m/s average, no AD (07/16/2020)    Time 8    Period Weeks    Status New    Target Date 09/10/20      PT LONG TERM GOAL #3   Title Pt will improve his 5 times sit <> stand time to 11 seconds or less as a demonstration of improved functional strength.    Baseline 13 seconds (07/16/2020)    Time 8    Period Weeks    Status New    Target Date 09/10/20      PT LONG TERM GOAL #4   Title Pt will improve his FOTO score by at least 10 points as a demonstration of improved function.    Baseline Pt has not completed his initial FOTO yet (07/16/2020)    Time 8    Period Weeks    Status New    Target Date 09/10/20                  Plan - 07/16/20 2204    Clinical Impression Statement Patient is a 69 year old male who came to physical therapy S/P partial excision  of bone in his R femur on 05/01/2020. Pt is currently 11 weeks post op. He also presents with hip weakness, altered gait pattern and posture, decreased scar tissue mobility, muscle tension, decreased endurance and gait speed, and difficulty performing standing tasks as well as ambulating long distances. Pt will benefit from skilled physical therapy to address the aforementinoed deficits.    Personal Factors and Comorbidities Comorbidity 3+;Fitness;Past/Current Experience    Comorbidities DM, HTN, PVD, CAD    Examination-Activity Limitations Sleep;Squat;Locomotion Level    Stability/Clinical Decision Making Stable/Uncomplicated    Clinical Decision Making Low    Rehab Potential Fair    PT Frequency 2x / week    PT Duration 8 weeks    PT Treatment/Interventions Therapeutic activities;Functional mobility training;Gait training;Stair training;Therapeutic exercise;Balance training;Neuromuscular re-education;Patient/family education;Manual techniques;Dry needling;Aquatic Therapy;Electrical Stimulation;Iontophoresis 4mg /ml Dexamethasone    PT Next Visit Plan hip strengthening, activity tolerance, balance, manual techniques, modalities PRN  Consulted and Agree with Plan of Care Patient           Patient will benefit from skilled therapeutic intervention in order to improve the following deficits and impairments:  Abnormal gait, Pain, Postural dysfunction, Improper body mechanics, Difficulty walking, Decreased strength, Decreased endurance  Visit Diagnosis: Muscle weakness (generalized) - Plan: PT plan of care cert/re-cert  Difficulty in walking, not elsewhere classified - Plan: PT plan of care cert/re-cert  Pain in right hip - Plan: PT plan of care cert/re-cert     Problem List Patient Active Problem List   Diagnosis Date Noted  . NSTEMI (non-ST elevated myocardial infarction) [facility]) 01/28/2019    Joneen Boers PT, DPT   07/16/2020, 10:28 PM  [place] [facility] [address] [place], [address] Phone: [phone]   Fax:  [phone]  Name: Damon English MRN: [id number] Date of Birth: [birth year]

## 2020-07-22 ENCOUNTER — Other Ambulatory Visit: Payer: Self-pay

## 2020-07-22 ENCOUNTER — Ambulatory Visit: Payer: BC Managed Care – PPO | Attending: Orthopedic Surgery

## 2020-07-22 DIAGNOSIS — M25551 Pain in right hip: Secondary | ICD-10-CM | POA: Insufficient documentation

## 2020-07-22 DIAGNOSIS — M6281 Muscle weakness (generalized): Secondary | ICD-10-CM

## 2020-07-22 DIAGNOSIS — R262 Difficulty in walking, not elsewhere classified: Secondary | ICD-10-CM | POA: Insufficient documentation

## 2020-07-22 NOTE — Therapy (Signed)
[place] [facility] [address] [place], [address] Phone: [phone]   Fax:  [phone]  Physical Therapy Treatment  Patient Details  Name: Damon English MRN: [id number] Date of Birth: 08/18/1951 Referring Provider (PT): Charlcie Cradle, MD   Encounter Date: 07/22/2020   PT End of Session - 07/22/20 1123    Visit Number 2    Number of Visits 17    Date for PT Re-Evaluation 09/10/20    PT Start Time 3149    PT Stop Time 1202    PT Time Calculation (min) 39 min    Activity Tolerance Patient tolerated treatment well    Behavior During Therapy [facility] for tasks assessed/performed           Past Medical History:  Diagnosis Date  . Alcohol use    "cutting back" but still heavy and daily, multiple shots of bourbon each night  . Diabetes mellitus without complication ([place])   . History of tobacco use    reportedly quit in 2013  . Hypertension   . Pulmonary embolism ([place]) 2012   unprovoked  . PVD (peripheral vascular disease) ([place])     Past Surgical History:  Procedure Laterality Date  . BACK SURGERY    . CHOLECYSTECTOMY    . CORONARY ANGIOGRAPHY N/A 01/30/2019   Procedure: CORONARY ANGIOGRAPHY;  Surgeon: Corey Skains, MD;  Location: [place] [facility];  Service: Cardiovascular;  Laterality: N/A;  . ERCP W/ SPHICTEROTOMY  2015  . FEMORAL ARTERY - POPLITEAL ARTERY BYPASS GRAFT Right 06/19/2018  . THROMBOENDARTERECTOMY Right 06/19/2018  . TONSILLECTOMY    . VASCULAR SURGERY      There were no vitals filed for this visit.   Subjective Assessment - 07/22/20 1124    Subjective R hip is ok    Pertinent History R femur osteochondroma. Had 2 heart attack this past May 2020. 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 Pt also has hx of vascular surgeries R LE which led to numbness.  Currently in PT to improve R hip strength. Has numbness R medial thigh and R leg and foot secondary to  vascular surgeries. Not as active as he used to be. 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  Pt used a rw after his surgery. Keeps a crutch in his truck just in case he needs it for long distances.  Had a really bad experience with a treadmill and does not want to use it. Pt fell and scared him. Able to ambulate without a crutch at home, about 50 ft to 100 ft. Stopped using his crutch about 1.5 weeks ago. Uses a rw to help him carry things. No falls within the last 6 months. No fear of falling. Has not had PT for his recent surgery.    Patient Stated Goals Walk further.    Currently in Pain? No/denies   R hip no pain, R foot bothers him though   Pain Onset More than a month ago                                     PT Education - 07/22/20 1227    Education Details ther-ex    [employer]) Educated Patient    Methods Explanation;Demonstration;Tactile cues;Verbal cues  Comprehension Returned demonstration;Verbalized understanding          Objective   Pt states ok to perform CPR and resuscitate him and call ambulance if an emergency happens.     Has angina, has nitroglycerine pills Takes 2-3 nitroglycerine pills a day.    No latex allergies   Observations: rest breaks needed secondary to fatigue and deconditioning.    Therapeutic exercise  Supine clamshell isometrics 10x3 with 5 second holds   S/L hip abduction   R 10x2  Discomfort when lying on R side to work L hip.   hooklying posterior pelvic tilt 10x3  Slight low back discomfort which eases with rest.   hooklying transversus abdominis contraction 10x5 seconds for 2 sets  hooklying R hip extension with R leg straight, propped on towel (behind knee) and pillow  10x3 with 5 second holds   Standing hip abduction with B UE assist   R 10x2  L 10x2  L low back discomfort  which eases with rest  Seated hip extension isometrics to promote glute max muscle strengthening.   R 10x5 seconds for 2 sets  L 10x5 seconds for 2 sets  Side stepping 32 ft to the R and 32 ft to the L 1x  L low back discomfort which eases with rest.      Improved exercise technique, movement at target joints, use of target muscles after mod verbal, visual, tactile cues.      Response to treatment Pt tolerated session well without aggravation of symptoms.   Clinical impression Worked on improving bilateral glute med and max strength to promote ability to perform standing tasks with less difficulty. Slight L low back discomfort with standing exercises wthich eases with rest. Pt will benefit from continued skilled physical therapy services to improve strength, endurance, function, and ability to ambulate longer distances.     PT Short Term Goals - 07/16/20 2213      PT SHORT TERM GOAL #1   Title Patient will be independent with his initial home exercise program to improve strength, function, and ability to ambulate longer distances.    Time 3    Period Weeks    Status New    Target Date 08/06/20             PT Long Term Goals - 07/16/20 2214      PT LONG TERM GOAL #1   Title Patient will improve R hip strength by at least 1/2 MMT grade to promote ability to ambulate and perform standing tasks with less difficulty.    Time 8    Period Weeks    Status New    Target Date 09/10/20      PT LONG TERM GOAL #2   Title Pt will improve his 10 meter walk speed to at least 1 meter/second without AD to promote community ambulation.    Baseline 0.87 m/s average, no AD (07/16/2020)    Time 8    Period Weeks    Status New    Target Date 09/10/20      PT LONG TERM GOAL #3   Title Pt will improve his 5 times sit <> stand time to 11 seconds or less as a demonstration of improved functional strength.    Baseline 13 seconds (07/16/2020)    Time 8    Period Weeks    Status New     Target Date 09/10/20      PT LONG TERM GOAL #4   Title Pt will  improve his FOTO score by at least 10 points as a demonstration of improved function.    Baseline Pt has not completed his initial FOTO yet (07/16/2020)    Time 8    Period Weeks    Status New    Target Date 09/10/20                 Plan - 07/22/20 1159    Clinical Impression Statement Worked on improving bilateral glute med and max strength to promote ability to perform standing tasks with less difficulty. Slight L low back discomfort with standing exercises wthich eases with rest. Pt will benefit from continued skilled physical therapy services to improve strength, endurance, function, and ability to ambulate longer distances.    Personal Factors and Comorbidities Comorbidity 3+;Fitness;Past/Current Experience    Comorbidities DM, HTN, PVD, CAD    Examination-Activity Limitations Sleep;Squat;Locomotion Level    Stability/Clinical Decision Making Stable/Uncomplicated    Rehab Potential Fair    PT Frequency 2x / week    PT Duration 8 weeks    PT Treatment/Interventions Therapeutic activities;Functional mobility training;Gait training;Stair training;Therapeutic exercise;Balance training;Neuromuscular re-education;Patient/family education;Manual techniques;Dry needling;Aquatic Therapy;Electrical Stimulation;Iontophoresis 4mg /ml Dexamethasone    PT Next Visit Plan hip strengthening, activity tolerance, balance, manual techniques, modalities PRN    Consulted and Agree with Plan of Care Patient           Patient will benefit from skilled therapeutic intervention in order to improve the following deficits and impairments:  Abnormal gait, Pain, Postural dysfunction, Improper body mechanics, Difficulty walking, Decreased strength, Decreased endurance  Visit Diagnosis: Muscle weakness (generalized)  Difficulty in walking, not elsewhere classified  Pain in right hip     Problem List Patient Active Problem List    Diagnosis Date Noted  . NSTEMI (non-ST elevated myocardial infarction) [facility]) 01/28/2019    Joneen Boers PT, DPT   07/22/2020, 12:30 PM  [place] [facility] [address] [place], [address] Phone: [phone]   Fax:  [phone]  Name: IGNATZ DEIS MRN: [id number] Date of Birth: [birth year]

## 2020-07-27 DIAGNOSIS — I82401 Acute embolism and thrombosis of unspecified deep veins of right lower extremity: Secondary | ICD-10-CM | POA: Diagnosis not present

## 2020-07-27 DIAGNOSIS — I739 Peripheral vascular disease, unspecified: Secondary | ICD-10-CM | POA: Diagnosis not present

## 2020-07-30 ENCOUNTER — Ambulatory Visit: Payer: BC Managed Care – PPO

## 2020-08-04 ENCOUNTER — Other Ambulatory Visit: Payer: Self-pay

## 2020-08-04 ENCOUNTER — Ambulatory Visit: Payer: BC Managed Care – PPO

## 2020-08-04 DIAGNOSIS — M25551 Pain in right hip: Secondary | ICD-10-CM

## 2020-08-04 DIAGNOSIS — R262 Difficulty in walking, not elsewhere classified: Secondary | ICD-10-CM

## 2020-08-04 DIAGNOSIS — M6281 Muscle weakness (generalized): Secondary | ICD-10-CM

## 2020-08-04 NOTE — Patient Instructions (Addendum)
  Access Code: [email or web address] URL: [email or web address] Date: 08/04/2020 Prepared by: Joneen Boers  Exercises Mini Squat with Counter Support - 1 x daily - 7 x weekly - 3 sets - 10 reps Standing Gluteal Sets - 1 x daily - 7 x weekly - 3 sets - 10 reps - 5 seconds hold Standing Scapular Retraction - 1 x daily - 7 x weekly - 3 sets - 10 reps - 5 seconds hold

## 2020-08-04 NOTE — Therapy (Signed)
[facility] [address], [place], [address] Phone: [phone]   Fax:  [phone]  Physical Therapy Treatment  Patient Details  Name: Damon English MRN: [id number] Date of Birth: July 08, 1951 Referring Provider (PT): Charlcie Cradle, MD   Encounter Date: 08/04/2020   PT End of Session - 08/04/20 1435    Visit Number 3    Number of Visits 17    Date for PT Re-Evaluation 09/10/20    PT Start Time 4431    PT Stop Time 1522    PT Time Calculation (min) 47 min    Activity Tolerance Patient tolerated treatment well    Behavior During Therapy [facility] for tasks assessed/performed           Past Medical History:  Diagnosis Date  . Alcohol use    "cutting back" but still heavy and daily, multiple shots of bourbon each night  . Diabetes mellitus without complication ([place])   . History of tobacco use    reportedly quit in 2013  . Hypertension   . Pulmonary embolism ([place]) 2012   unprovoked  . PVD (peripheral vascular disease) ([place])     Past Surgical History:  Procedure Laterality Date  . BACK SURGERY    . CHOLECYSTECTOMY    . CORONARY ANGIOGRAPHY N/A 01/30/2019   Procedure: CORONARY ANGIOGRAPHY;  Surgeon: Corey Skains, MD;  Location: [facility];  Service: Cardiovascular;  Laterality: N/A;  . ERCP W/ SPHICTEROTOMY  2015  . FEMORAL ARTERY - POPLITEAL ARTERY BYPASS GRAFT Right 06/19/2018  . THROMBOENDARTERECTOMY Right 06/19/2018  . TONSILLECTOMY    . VASCULAR SURGERY      There were no vitals filed for this visit.   Subjective Assessment - 08/04/20 1436    Subjective Walking bothers him because of the R leg numbness from the vascular issues. Having trouble with his leg due to vascular issues. L low back bothers him because he might be favoring his R LE.  The doctor's office gave him a lot of exercises for his R LE but has a hard time doing them because a lot of them are laying on his back.  Also has a hard time reaching down to lower the plate of water for his dogs. No R hip pain currently.  Pt might have to cancel next thursday due to his wife's eye lid surgery.    Pertinent History R femur osteochondroma. Had 2 heart attack this past May 2020. 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 Pt also has hx of vascular surgeries R LE which led to numbness.  Currently in PT to improve R hip strength. Has numbness R medial thigh and R leg and foot secondary to vascular surgeries. Not as active as he used to be. 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  Pt used a rw after his surgery. Keeps a crutch in his truck just in case he needs it for long distances.  Had a really bad experience with a treadmill and does not want to use it. Pt fell and scared him. Able to ambulate without a crutch at home, about 50 ft to 100 ft. Stopped using his crutch about 1.5 weeks ago. Uses a rw to help him carry things. No falls within the last 6 months. No fear of falling. Has not had PT for his recent surgery.  Patient Stated Goals Walk further.    Currently in Pain? No/denies    Pain Onset More than a month ago                                     PT Education - 08/04/20 1454    Education Details ther-ex, HEP    Person(s) Educated Patient    Methods Explanation;Demonstration;Tactile cues;Verbal cues;Handout    Comprehension Returned demonstration;Verbalized understanding          Objective   Pt states ok to perform CPR and resuscitate him and call ambulance if an emergency happens.    Has angina, has nitroglycerine pills Takes 2-3 nitroglycerine pills a day.    No latex allergies   Observations: rest breaks needed secondary to fatigue and deconditioning.  Medbridge Access Code [email or web address]  Therapeutic exercise  Unclear R  calf squeeze. Pt states it just feels like pressure. Takes blood thinners. No abnormal warmth or redness. R leg swells up and disappears on its own now and again per pt.    Standing mini squats without UE assist 10x2 to promote ability to place a dog bowl on the ground  SLS with B UE assist   R 10x5 seconds   L 10x5 seconds   Standing hip abduction with B UE assist              R 10x2             L 10x2  Standing glute max squeeze with B scapular retraction 10x5 seconds for 2 sets  Side stepping 5 ft to the R and 5 ft to the L 5x2. L posterior glute pain afterwards. Less L glute pain with glute max muscle activation.    Seated hip extension isometrics   L 10x5 seconds for 2 sets  R 10x5 seconds   Standing hip extension with B UE assist   R 10x5 seconds     Therapeutic rest breaks provided secondary to fatigue.    Improved exercise technique, movement at target joints, use of target muscles after mod verbal, visual, tactile cues.      Response to treatment Pt tolerated session well without aggravation of symptoms.   Clinical impression Continued working on overall LE strength to promote function and endurance. Continued working on glute med and max strengthening to improve R hip strength from his surgery. Therapeutic rest breaks provided secondary to fatigue/deconditioning. Pt tolerated session well without aggravation of symptoms. Pt will benefit from continued skilled physical therapy services to improve strength, endurance, and function.       PT Short Term Goals - 07/16/20 2213      PT SHORT TERM GOAL #1   Title Patient will be independent with his initial home exercise program to improve strength, function, and ability to ambulate longer distances.    Time 3    Period Weeks    Status New    Target Date 08/06/20             PT Long Term Goals - 07/16/20 2214      PT LONG TERM GOAL #1   Title Patient will improve R hip strength by at least 1/2 MMT  grade to promote ability to ambulate and perform standing tasks with less difficulty.    Time 8    Period Weeks    Status New    Target Date 09/10/20  PT LONG TERM GOAL #2   Title Pt will improve his 10 meter walk speed to at least 1 meter/second without AD to promote community ambulation.    Baseline 0.87 m/s average, no AD (07/16/2020)    Time 8    Period Weeks    Status New    Target Date 09/10/20      PT LONG TERM GOAL #3   Title Pt will improve his 5 times sit <> stand time to 11 seconds or less as a demonstration of improved functional strength.    Baseline 13 seconds (07/16/2020)    Time 8    Period Weeks    Status New    Target Date 09/10/20      PT LONG TERM GOAL #4   Title Pt will improve his FOTO score by at least 10 points as a demonstration of improved function.    Baseline Pt has not completed his initial FOTO yet (07/16/2020)    Time 8    Period Weeks    Status New    Target Date 09/10/20                 Plan - 08/04/20 1507    Clinical Impression Statement Continued working on overall LE strength to promote function and endurance. Continued working on glute med and max strengthening to improve R hip strength from his surgery. Therapeutic rest breaks provided secondary to fatigue/deconditioning. Pt tolerated session well without aggravation of symptoms. Pt will benefit from continued skilled physical therapy services to improve strength, endurance, and function.    Personal Factors and Comorbidities Comorbidity 3+;Fitness;Past/Current Experience    Comorbidities DM, HTN, PVD, CAD    Examination-Activity Limitations Sleep;Squat;Locomotion Level    Stability/Clinical Decision Making Stable/Uncomplicated    Rehab Potential Fair    PT Frequency 2x / week    PT Duration 8 weeks    PT Treatment/Interventions Therapeutic activities;Functional mobility training;Gait training;Stair training;Therapeutic exercise;Balance training;Neuromuscular  re-education;Patient/family education;Manual techniques;Dry needling;Aquatic Therapy;Electrical Stimulation;Iontophoresis 4mg /ml Dexamethasone    PT Next Visit Plan hip strengthening, activity tolerance, balance, manual techniques, modalities PRN    Consulted and Agree with Plan of Care Patient           Patient will benefit from skilled therapeutic intervention in order to improve the following deficits and impairments:  Abnormal gait, Pain, Postural dysfunction, Improper body mechanics, Difficulty walking, Decreased strength, Decreased endurance  Visit Diagnosis: Muscle weakness (generalized)  Difficulty in walking, not elsewhere classified  Pain in right hip     Problem List Patient Active Problem List   Diagnosis Date Noted  . NSTEMI (non-ST elevated myocardial infarction) [facility]) 01/28/2019    Joneen Boers PT, DPT   08/04/2020, 5:21 PM  [facility] [address] [place], [address] Phone: [phone]   Fax:  [phone]  Name: REAL CONA MRN: [id number] Date of Birth: September 17, 1951

## 2020-08-06 ENCOUNTER — Ambulatory Visit: Payer: BC Managed Care – PPO

## 2020-08-06 ENCOUNTER — Other Ambulatory Visit: Payer: Self-pay

## 2020-08-06 DIAGNOSIS — R262 Difficulty in walking, not elsewhere classified: Secondary | ICD-10-CM | POA: Diagnosis not present

## 2020-08-06 DIAGNOSIS — M25551 Pain in right hip: Secondary | ICD-10-CM

## 2020-08-06 DIAGNOSIS — M6281 Muscle weakness (generalized): Secondary | ICD-10-CM | POA: Diagnosis not present

## 2020-08-06 NOTE — Therapy (Signed)
[facility] [address], [place], [address] Phone: [phone]   Fax:  [phone]  Physical Therapy Treatment  Patient Details  Name: Damon English MRN: [id number] Date of Birth: 04-03-1951 Referring Provider (PT): Charlcie Cradle, MD   Encounter Date: 08/06/2020   PT End of Session - 08/06/20 1119    Visit Number 4    Number of Visits 17    Date for PT Re-Evaluation 09/10/20    PT Start Time 1118    PT Stop Time 1159    PT Time Calculation (min) 41 min    Activity Tolerance Patient tolerated treatment well    Behavior During Therapy [facility] for tasks assessed/performed           Past Medical History:  Diagnosis Date  . Alcohol use    "cutting back" but still heavy and daily, multiple shots of bourbon each night  . Diabetes mellitus without complication ([place])   . History of tobacco use    reportedly quit in 2013  . Hypertension   . Pulmonary embolism ([place]) 2012   unprovoked  . PVD (peripheral vascular disease) ([place])     Past Surgical History:  Procedure Laterality Date  . BACK SURGERY    . CHOLECYSTECTOMY    . CORONARY ANGIOGRAPHY N/A 01/30/2019   Procedure: CORONARY ANGIOGRAPHY;  Surgeon: Corey Skains, MD;  Location: [facility];  Service: Cardiovascular;  Laterality: N/A;  . ERCP W/ SPHICTEROTOMY  2015  . FEMORAL ARTERY - POPLITEAL ARTERY BYPASS GRAFT Right 06/19/2018  . THROMBOENDARTERECTOMY Right 06/19/2018  . TONSILLECTOMY    . VASCULAR SURGERY      There were no vitals filed for this visit.   Subjective Assessment - 08/06/20 1119    Subjective Short of breath this morning. Has not had to take a nitro. Feels wore out today and after last session. Has L low back soreness. 4/10 L low back soreness when walking.    Pertinent History R femur osteochondroma. Had 2 heart attack this past May 2020. 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 Pt also has hx of  vascular surgeries R LE which led to numbness.  Currently in PT to improve R hip strength. Has numbness R medial thigh and R leg and foot secondary to vascular surgeries. Not as active as he used to be. 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  Pt used a rw after his surgery. Keeps a crutch in his truck just in case he needs it for long distances.  Had a really bad experience with a treadmill and does not want to use it. Pt fell and scared him. Able to ambulate without a crutch at home, about 50 ft to 100 ft. Stopped using his crutch about 1.5 weeks ago. Uses a rw to help him carry things. No falls within the last 6 months. No fear of falling. Has not had PT for his recent surgery.    Patient Stated Goals Walk further.    Currently in Pain? Yes    Pain Score 4     Pain Location Back    Pain Orientation Left;Lower    Pain Onset More than a month ago  PT Education - 08/06/20 1143    Education Details ther-ex, HEP    Person(s) Educated Patient    Methods Explanation;Demonstration;Tactile cues;Verbal cues;Handout    Comprehension Returned demonstration;Verbalized understanding          Objective   Pt states ok to perform CPR and resuscitate him and call ambulance if an emergency happens.    Has angina, has nitroglycerine pills Takes 2-3 nitroglycerine pills a day.    No latex allergies   Observations: rest breaks needed secondary to fatigue and deconditioning.  Medbridge Access Code [email or web address]   No treadmills per pt request   Therapeutic exercise  Seated manually resisted L upper trunk rotation isometrics to promote R lower back rotation to promote a more neutral posture in neutral 10x3 with 5 seconds   Seated transversus abdominis contraction 10x3 with 5 second holds to decrease lumbar  paraspinal muscle tension    Decreased L low back soreness  Seated manually resisted trunk flexion isometrics 10x3 with 5 seconds  Standing glute max squeeze with B scapular retraction 10x5 seconds for 3 sets  SpO2 98%, HR 75 BPM  Gait 150 ft x   To promote walking endurance. R lateral thigh soreness.   Forward step up onto 4 inch step with one UE assist   R 10x2  98 % SpO2 HR 82 BPM   Seated R hip extension isometrics 10x5 seconds   Therapeutic rest breaks provided secondary to fatigue.    Improved exercise technique, movement at target joints, use of target muscles after mod verbal, visual, tactile cues.     Response to treatment Pt tolerated session well without aggravation of symptoms.   Clinical impression Decreased L low back soreness with treatment to improve more neutral low back posture, decrease paraspinal muscle tension, and improve glute and trunk muscle use to decrease extension stress. Able to ambulate 150 ft prior to rest break secondary to R lateral thigh soreness. Pt tolerated session well without aggravation of symptoms. Pt will benefit from continued skilled physical therapy services to improve strength, endurance, and function.       PT Short Term Goals - 07/16/20 2213      PT SHORT TERM GOAL #1   Title Patient will be independent with his initial home exercise program to improve strength, function, and ability to ambulate longer distances.    Time 3    Period Weeks    Status New    Target Date 08/06/20             PT Long Term Goals - 07/16/20 2214      PT LONG TERM GOAL #1   Title Patient will improve R hip strength by at least 1/2 MMT grade to promote ability to ambulate and perform standing tasks with less difficulty.    Time 8    Period Weeks    Status New    Target Date 09/10/20      PT LONG TERM GOAL #2   Title Pt will improve his 10 meter walk speed to at least 1 meter/second without AD to promote community ambulation.     Baseline 0.87 m/s average, no AD (07/16/2020)    Time 8    Period Weeks    Status New    Target Date 09/10/20      PT LONG TERM GOAL #3   Title Pt will improve his 5 times sit <> stand time to 11 seconds or less as a demonstration of improved functional strength.  Baseline 13 seconds (07/16/2020)    Time 8    Period Weeks    Status New    Target Date 09/10/20      PT LONG TERM GOAL #4   Title Pt will improve his FOTO score by at least 10 points as a demonstration of improved function.    Baseline Pt has not completed his initial FOTO yet (07/16/2020)    Time 8    Period Weeks    Status New    Target Date 09/10/20                 Plan - 08/06/20 1145    Clinical Impression Statement Decreased L low back soreness with treatment to improve more neutral low back posture, decrease paraspinal muscle tension, and improve glute and trunk muscle use to decrease extension stress. Able to ambulate 150 ft prior to rest break secondary to R lateral thigh soreness. Pt tolerated session well without aggravation of symptoms. Pt will benefit from continued skilled physical therapy services to improve strength, endurance, and function.    Personal Factors and Comorbidities Comorbidity 3+;Fitness;Past/Current Experience    Comorbidities DM, HTN, PVD, CAD    Examination-Activity Limitations Sleep;Squat;Locomotion Level    Stability/Clinical Decision Making Stable/Uncomplicated    Rehab Potential Fair    PT Frequency 2x / week    PT Duration 8 weeks    PT Treatment/Interventions Therapeutic activities;Functional mobility training;Gait training;Stair training;Therapeutic exercise;Balance training;Neuromuscular re-education;Patient/family education;Manual techniques;Dry needling;Aquatic Therapy;Electrical Stimulation;Iontophoresis 4mg /ml Dexamethasone    PT Next Visit Plan hip strengthening, activity tolerance, balance, manual techniques, modalities PRN    Consulted and Agree with Plan of Care  Patient           Patient will benefit from skilled therapeutic intervention in order to improve the following deficits and impairments:  Abnormal gait, Pain, Postural dysfunction, Improper body mechanics, Difficulty walking, Decreased strength, Decreased endurance  Visit Diagnosis: Muscle weakness (generalized)  Difficulty in walking, not elsewhere classified  Pain in right hip     Problem List Patient Active Problem List   Diagnosis Date Noted  . NSTEMI (non-ST elevated myocardial infarction) [facility]) 01/28/2019    Joneen Boers PT, DPT   08/06/2020, 12:21 PM  [place] [facility] [address] [place], [address] Phone: [phone]   Fax:  [phone]  Name: Damon English MRN: [id number] Date of Birth: 12/01/1950

## 2020-08-07 DIAGNOSIS — H2513 Age-related nuclear cataract, bilateral: Secondary | ICD-10-CM | POA: Diagnosis not present

## 2020-08-07 DIAGNOSIS — H401132 Primary open-angle glaucoma, bilateral, moderate stage: Secondary | ICD-10-CM | POA: Diagnosis not present

## 2020-08-11 ENCOUNTER — Other Ambulatory Visit: Payer: Self-pay

## 2020-08-11 ENCOUNTER — Ambulatory Visit: Payer: BC Managed Care – PPO

## 2020-08-11 DIAGNOSIS — M6281 Muscle weakness (generalized): Secondary | ICD-10-CM

## 2020-08-11 DIAGNOSIS — R262 Difficulty in walking, not elsewhere classified: Secondary | ICD-10-CM

## 2020-08-11 DIAGNOSIS — M25551 Pain in right hip: Secondary | ICD-10-CM | POA: Diagnosis not present

## 2020-08-11 NOTE — Therapy (Signed)
[facility] [address], [place], [address] Phone: [phone]   Fax:  [phone]  Physical Therapy Treatment  Patient Details  Name: Damon English MRN: [id number] Date of Birth: Jul 11, 1951 Referring Provider (PT): Charlcie Cradle, MD   Encounter Date: 08/11/2020   PT End of Session - 08/11/20 1119    Visit Number 5    Number of Visits 17    Date for PT Re-Evaluation 09/10/20    PT Start Time 1119    PT Stop Time 1205    PT Time Calculation (min) 46 min    Activity Tolerance Patient tolerated treatment well    Behavior During Therapy [facility] for tasks assessed/performed           Past Medical History:  Diagnosis Date  . Alcohol use    "cutting back" but still heavy and daily, multiple shots of bourbon each night  . Diabetes mellitus without complication ([place])   . History of tobacco use    reportedly quit in 2013  . Hypertension   . Pulmonary embolism ([place]) 2012   unprovoked  . PVD (peripheral vascular disease) ([place])     Past Surgical History:  Procedure Laterality Date  . BACK SURGERY    . CHOLECYSTECTOMY    . CORONARY ANGIOGRAPHY N/A 01/30/2019   Procedure: CORONARY ANGIOGRAPHY;  Surgeon: Corey Skains, MD;  Location: [place] [facility];  Service: Cardiovascular;  Laterality: N/A;  . ERCP W/ SPHICTEROTOMY  2015  . FEMORAL ARTERY - POPLITEAL ARTERY BYPASS GRAFT Right 06/19/2018  . THROMBOENDARTERECTOMY Right 06/19/2018  . TONSILLECTOMY    . VASCULAR SURGERY      There were no vitals filed for this visit.   Subjective Assessment - 08/11/20 1120    Subjective L low back bothers him. 3/10 L low back when walking. Has not done much exercises this weekend. Was busy with work. Wife having sugery this Wednesday for her eyelid and pt might not be able to make it to his next session. L low back felt better after last session.    Pertinent History R femur osteochondroma. Had 2 heart  attack this past May 2020. 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 Pt also has hx of vascular surgeries R LE which led to numbness.  Currently in PT to improve R hip strength. Has numbness R medial thigh and R leg and foot secondary to vascular surgeries. Not as active as he used to be. 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  Pt used a rw after his surgery. Keeps a crutch in his truck just in case he needs it for long distances.  Had a really bad experience with a treadmill and does not want to use it. Pt fell and scared him. Able to ambulate without a crutch at home, about 50 ft to 100 ft. Stopped using his crutch about 1.5 weeks ago. Uses a rw to help him carry things. No falls within the last 6 months. No fear of falling. Has not had PT for his recent surgery.    Patient Stated Goals Walk further.    Currently in Pain? Yes    Pain Score 3     Pain Onset More than a month ago  PT Education - 08/11/20 1220    Education Details ther-ex    Person(s) Educated Patient    Methods Explanation;Demonstration;Tactile cues;Verbal cues    Comprehension Returned demonstration;Verbalized understanding          Objective   Pt states ok to perform CPR and resuscitate him and call ambulance if an emergency happens.    Has angina, has nitroglycerine pills Takes 2-3 nitroglycerine pills a day.    No latex allergies   Observations: rest breaks needed secondary to fatigue and deconditioning.  MedbridgeAccess Code [email or web address]   No treadmills per pt request   Therapeutic exercise  Seated manually resisted L upper trunk rotation isometrics to promote R lower back rotation to promote a more neutral posture in neutral 10x3 with 5 seconds    Standing PNF   To the L red band 10x5  seconds. Easy  Then with double red band 10x5 seconds, then 10x   Seated manually resisted L lateral shift isometrics in neutral to counter R lateral shift posture 10x5 seconds for 3 sets    Seated manually resisted trunk flexion isometrics 10x3 with 5 seconds  Seated hip extension isometrics   R 10x5 seconds for 3 sets  Static mini lunge with one UE assist  R 10x  L 10x  Seated transversus abdominis contraction 10x3 with 5 second holds to decrease lumbar paraspinal muscle tension   Pt was recommended to perform his HEP to help with progress as well as to gradually ambulate further distances to improve endurance. Pt verbalized understanding.  Therapeutic rest breaks provided secondary to fatigue.   Improved exercise technique, movement at target joints, use of target muscles after min to mod verbal, visual, tactile cues.     Response to treatment Pt tolerated session well without aggravation of symptoms.   Clinical impression Continued working on improving posture, trunk and glute strength to decrease low back extension stress. Decreased L low back pain with gait after session. Pt was recommended to gradually increase walking distance to help improve endurance. Pt will benefit from continued skilled physical therapy services to improve strength, endurance, and function.        PT Short Term Goals - 07/16/20 2213      PT SHORT TERM GOAL #1   Title Patient will be independent with his initial home exercise program to improve strength, function, and ability to ambulate longer distances.    Time 3    Period Weeks    Status New    Target Date 08/06/20             PT Long Term Goals - 07/16/20 2214      PT LONG TERM GOAL #1   Title Patient will improve R hip strength by at least 1/2 MMT grade to promote ability to ambulate and perform standing tasks with less difficulty.    Time 8    Period Weeks    Status New    Target Date 09/10/20      PT LONG TERM  GOAL #2   Title Pt will improve his 10 meter walk speed to at least 1 meter/second without AD to promote community ambulation.    Baseline 0.87 m/s average, no AD (07/16/2020)    Time 8    Period Weeks    Status New    Target Date 09/10/20      PT LONG TERM GOAL #3   Title Pt will improve his 5 times sit <> stand time to 11 seconds or less as  a demonstration of improved functional strength.    Baseline 13 seconds (07/16/2020)    Time 8    Period Weeks    Status New    Target Date 09/10/20      PT LONG TERM GOAL #4   Title Pt will improve his FOTO score by at least 10 points as a demonstration of improved function.    Baseline Pt has not completed his initial FOTO yet (07/16/2020)    Time 8    Period Weeks    Status New    Target Date 09/10/20                 Plan - 08/11/20 1218    Clinical Impression Statement Continued working on improving posture, trunk and glute strength to decrease low back extension stress. Decreased L low back pain with gait after session. Pt was recommended to gradually increase walking distance to help improve endurance. Pt will benefit from continued skilled physical therapy services to improve strength, endurance, and function.    Personal Factors and Comorbidities Comorbidity 3+;Fitness;Past/Current Experience    Comorbidities DM, HTN, PVD, CAD    Examination-Activity Limitations Sleep;Squat;Locomotion Level    Stability/Clinical Decision Making Stable/Uncomplicated    Rehab Potential Fair    PT Frequency 2x / week    PT Duration 8 weeks    PT Treatment/Interventions Therapeutic activities;Functional mobility training;Gait training;Stair training;Therapeutic exercise;Balance training;Neuromuscular re-education;Patient/family education;Manual techniques;Dry needling;Aquatic Therapy;Electrical Stimulation;Iontophoresis 4mg /ml Dexamethasone    PT Next Visit Plan hip strengthening, activity tolerance, balance, manual techniques, modalities PRN     Consulted and Agree with Plan of Care Patient           Patient will benefit from skilled therapeutic intervention in order to improve the following deficits and impairments:  Abnormal gait, Pain, Postural dysfunction, Improper body mechanics, Difficulty walking, Decreased strength, Decreased endurance  Visit Diagnosis: Muscle weakness (generalized)  Difficulty in walking, not elsewhere classified     Problem List Patient Active Problem List   Diagnosis Date Noted  . NSTEMI (non-ST elevated myocardial infarction) [facility]) 01/28/2019    Joneen Boers PT, DPT    08/11/2020, 12:24 PM  [facility] [address] [place], [address] Phone: [phone]   Fax:  [phone]  Name: KHIAN REMO MRN: [id number] Date of Birth: Nov 19, 1950

## 2020-08-11 NOTE — Patient Instructions (Signed)
Pt was recommended to perform his HEP to help with progress as well as to gradually ambulate further distances to improve endurance. Pt verbalized understanding.

## 2020-08-13 ENCOUNTER — Ambulatory Visit: Payer: BC Managed Care – PPO

## 2020-08-17 ENCOUNTER — Ambulatory Visit: Payer: BC Managed Care – PPO

## 2020-08-18 ENCOUNTER — Ambulatory Visit: Payer: BC Managed Care – PPO | Attending: Orthopedic Surgery

## 2020-08-18 ENCOUNTER — Other Ambulatory Visit: Payer: Self-pay

## 2020-08-18 DIAGNOSIS — M25551 Pain in right hip: Secondary | ICD-10-CM | POA: Insufficient documentation

## 2020-08-18 DIAGNOSIS — M6281 Muscle weakness (generalized): Secondary | ICD-10-CM | POA: Diagnosis not present

## 2020-08-18 DIAGNOSIS — R262 Difficulty in walking, not elsewhere classified: Secondary | ICD-10-CM

## 2020-08-18 NOTE — Therapy (Signed)
[facility] [address] [place] [address], [place], [address] Phone: [phone]   Fax:  [phone]  Physical Therapy Treatment  Patient Details  Name: Damon English MRN: [id number] Date of Birth: [birth year] Referring Provider (PT): Charlcie Cradle, MD   Encounter Date: 08/18/2020   PT End of Session - 08/18/20 1551    Visit Number 6    Number of Visits 17    Date for PT Re-Evaluation 09/10/20    PT Start Time 8891    PT Stop Time 6945    PT Time Calculation (min) 52 min    Activity Tolerance Patient tolerated treatment well    Behavior During Therapy [facility] for tasks assessed/performed           Past Medical History:  Diagnosis Date   Alcohol use    "cutting back" but still heavy and daily, multiple shots of bourbon each night   Diabetes mellitus without complication ([place])    History of tobacco use    reportedly quit in 2013   Hypertension    Pulmonary embolism ([place]) 2012   unprovoked   PVD (peripheral vascular disease) ([place])     Past Surgical History:  Procedure Laterality Date   BACK SURGERY     CHOLECYSTECTOMY     CORONARY ANGIOGRAPHY N/A 01/30/2019   Procedure: CORONARY ANGIOGRAPHY;  Surgeon: Corey Skains, MD;  Location: [facility];  Service: Cardiovascular;  Laterality: N/A;   ERCP W/ SPHICTEROTOMY  2015   FEMORAL ARTERY - POPLITEAL ARTERY BYPASS GRAFT Right 06/19/2018   THROMBOENDARTERECTOMY Right 06/19/2018   TONSILLECTOMY     VASCULAR SURGERY      There were no vitals filed for this visit.   Subjective Assessment - 08/18/20 1552    Subjective L low back is ok, about a 1/10, not too bad. It comes and goes. Pt states both eyes are blurry today. Had his eyes examined and was told that his cataracts have worsened for both eyes.  Has not had any clots since taking his 2 blood thinner medications.    Pertinent History R femur osteochondroma. Had 2 heart attack this past  May 2020. 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 Pt also has hx of vascular surgeries R LE which led to numbness.  Currently in PT to improve R hip strength. Has numbness R medial thigh and R leg and foot secondary to vascular surgeries. Not as active as he used to be. 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  Pt used a rw after his surgery. Keeps a crutch in his truck just in case he needs it for long distances.  Had a really bad experience with a treadmill and does not want to use it. Pt fell and scared him. Able to ambulate without a crutch at home, about 50 ft to 100 ft. Stopped using his crutch about 1.5 weeks ago. Uses a rw to help him carry things. No falls within the last 6 months. No fear of falling. Has not had PT for his recent surgery.    Patient Stated Goals Walk further.    Currently in Pain? Yes    Pain Score 1     Pain Onset More than a month ago  PT Education - 08/18/20 1609    Education Details ther-ex    Person(s) Educated Patient    Methods Explanation;Demonstration;Tactile cues;Verbal cues    Comprehension Returned demonstration;Verbalized understanding          Objective   Pt states ok to perform CPR and resuscitate him and call ambulance if an emergency happens.    Has angina, has nitroglycerine pills Takes 2-3 nitroglycerine pills a day.    No latex allergies   Observations: rest breaks needed secondary to fatigue and deconditioning.  MedbridgeAccess Code [email or web address]   No treadmillsper pt request   Therapeutic exercise  Static mini lunge with one UE assist             L 5x. R foot discomfort   SLS with B UE assist  R 10x5 seconds for 2 sets  L 10x5 seconds   Blood pressure, L arm sitting, mechanically taken 174/60, HR 72   Manually  taken: 160/64  Static standing x 3 minutes, no changes in vision in standing.   Standing hip abduction with B UE assist   R 10x3  L 10x3  Forward step up onto 4 inch step with B UE assist   R 10x2    No reports of increased vision blurriness throughout session.    Gait around the gym 277 ft prior to rest.   Seated B scapular retraction to promote thoracic extension to decrease low back extension stress 10x3   Lateral step ups onto 4 inch step with B UE assist   R 10x   Therapeutic rest breaks provided secondary to fatigue.   Improved exercise technique, movement at target joints, use of target muscles after min to mod verbal, visual, tactile cues.  Manual therapy  Seated STM B lumbar paraspinal muscles to decrease tension    Response to treatment Pt tolerated session well without aggravation of symptoms.  Decreased blurred vision after session.  Clinical impression Continued working on improving R glute med and quadriceps strength functionally to promote ability to ambulate longer distances as well as negotiate curbs and steps with less difficulty. Able to ambulate up to 277 ft today prior to needing a rest break. Pt tolerated session well without aggravation of symptoms. Pt will benefit from continued skilled physical therapy services to improve strength, endurance, and function.        PT Short Term Goals - 07/16/20 2213      PT SHORT TERM GOAL #1   Title Patient will be independent with his initial home exercise program to improve strength, function, and ability to ambulate longer distances.    Time 3    Period Weeks    Status New    Target Date 08/06/20             PT Long Term Goals - 07/16/20 2214      PT LONG TERM GOAL #1   Title Patient will improve R hip strength by at least 1/2 MMT grade to promote ability to ambulate and perform standing tasks with less difficulty.    Time 8    Period Weeks    Status New    Target Date 09/10/20       PT LONG TERM GOAL #2   Title Pt will improve his 10 meter walk speed to at least 1 meter/second without AD to promote community ambulation.    Baseline 0.87 m/s average, no AD (07/16/2020)    Time 8    Period Weeks    Status New  Target Date 09/10/20      PT LONG TERM GOAL #3   Title Pt will improve his 5 times sit <> stand time to 11 seconds or less as a demonstration of improved functional strength.    Baseline 13 seconds (07/16/2020)    Time 8    Period Weeks    Status New    Target Date 09/10/20      PT LONG TERM GOAL #4   Title Pt will improve his FOTO score by at least 10 points as a demonstration of improved function.    Baseline Pt has not completed his initial FOTO yet (07/16/2020)    Time 8    Period Weeks    Status New    Target Date 09/10/20                 Plan - 08/18/20 1609    Clinical Impression Statement Continued working on improving R glute med and quadriceps strength functionally to promote ability to ambulate longer distances as well as negotiate curbs and steps with less difficulty. Able to ambulate up to 277 ft today prior to needing a rest break. Pt tolerated session well without aggravation of symptoms. Pt will benefit from continued skilled physical therapy services to improve strength, endurance, and function.    Personal Factors and Comorbidities Comorbidity 3+;Fitness;Past/Current Experience    Comorbidities DM, HTN, PVD, CAD    Examination-Activity Limitations Sleep;Squat;Locomotion Level    Stability/Clinical Decision Making Stable/Uncomplicated    Rehab Potential Fair    PT Frequency 2x / week    PT Duration 8 weeks    PT Treatment/Interventions Therapeutic activities;Functional mobility training;Gait training;Stair training;Therapeutic exercise;Balance training;Neuromuscular re-education;Patient/family education;Manual techniques;Dry needling;Aquatic Therapy;Electrical Stimulation;Iontophoresis 4mg /ml Dexamethasone    PT Next Visit Plan hip  strengthening, activity tolerance, balance, manual techniques, modalities PRN    Consulted and Agree with Plan of Care Patient           Patient will benefit from skilled therapeutic intervention in order to improve the following deficits and impairments:  Abnormal gait, Pain, Postural dysfunction, Improper body mechanics, Difficulty walking, Decreased strength, Decreased endurance  Visit Diagnosis: Muscle weakness (generalized)  Difficulty in walking, not elsewhere classified     Problem List Patient Active Problem List   Diagnosis Date Noted   NSTEMI (non-ST elevated myocardial infarction) ([place]) 01/28/2019    Joneen Boers PT, DPT   08/18/2020, 4:55 PM  [facility] [address] [place], [address] Phone: [phone]   Fax:  [phone]  Name: Damon English MRN: [id number] Date of Birth: 05-25-1951

## 2020-08-20 ENCOUNTER — Other Ambulatory Visit: Payer: Self-pay

## 2020-08-20 ENCOUNTER — Ambulatory Visit: Payer: BC Managed Care – PPO

## 2020-08-20 DIAGNOSIS — M25551 Pain in right hip: Secondary | ICD-10-CM | POA: Diagnosis not present

## 2020-08-20 DIAGNOSIS — R262 Difficulty in walking, not elsewhere classified: Secondary | ICD-10-CM | POA: Diagnosis not present

## 2020-08-20 DIAGNOSIS — M6281 Muscle weakness (generalized): Secondary | ICD-10-CM | POA: Diagnosis not present

## 2020-08-20 NOTE — Therapy (Signed)
[place] [facility] [address] [place], [address] Phone: [phone]   Fax:  [phone]  Physical Therapy Treatment  Patient Details  Name: Damon English MRN: [id number] Date of Birth: 1950-11-22 Referring Provider (PT): Charlcie Cradle, MD   Encounter Date: 08/20/2020   PT End of Session - 08/20/20 1433    Visit Number 7    Number of Visits 17    Date for PT Re-Evaluation 09/10/20    PT Start Time 6759    PT Stop Time 1450    PT Time Calculation (min) 17 min    Activity Tolerance Patient tolerated treatment well    Behavior During Therapy [facility] for tasks assessed/performed           Past Medical History:  Diagnosis Date  . Alcohol use    "cutting back" but still heavy and daily, multiple shots of bourbon each night  . Diabetes mellitus without complication ([place])   . History of tobacco use    reportedly quit in 2013  . Hypertension   . Pulmonary embolism ([place]) 2012   unprovoked  . PVD (peripheral vascular disease) ([place])     Past Surgical History:  Procedure Laterality Date  . BACK SURGERY    . CHOLECYSTECTOMY    . CORONARY ANGIOGRAPHY N/A 01/30/2019   Procedure: CORONARY ANGIOGRAPHY;  Surgeon: Corey Skains, MD;  Location: [place] [facility];  Service: Cardiovascular;  Laterality: N/A;  . ERCP W/ SPHICTEROTOMY  2015  . FEMORAL ARTERY - POPLITEAL ARTERY BYPASS GRAFT Right 06/19/2018  . THROMBOENDARTERECTOMY Right 06/19/2018  . TONSILLECTOMY    . VASCULAR SURGERY      There were no vitals filed for this visit.   Subjective Assessment - 08/20/20 1433    Subjective Does not know what's going on, had to take 3 nitro's today. 3rd one is right before he left for PT. Calls his cardiologist if he needs to.  Back is bothering him today. Yesterday was fine. Usually feels better after the sessions.    Pertinent History R femur osteochondroma. Had 2 heart attack this past May 2020. Currently S/P  R hip surgery to remove tumor on 05/01/2020. Pt also has hx of vascular surgeries R LE which led to numbness.  Currently in PT to improve R hip strength. Has numbness R medial thigh and R leg and foot secondary to vascular surgeries. Not as active as he used to be. 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  Pt used a rw after his surgery. Keeps a crutch in his truck just in case he needs it for long distances.  Had a really bad experience with a treadmill and does not want to use it. Pt fell and scared him. Able to ambulate without a crutch at home, about 50 ft to 100 ft. Stopped using his crutch about 1.5 weeks ago. Uses a rw to help him carry things. No falls within the last 6 months. No fear of falling. Has not had PT for his recent surgery.    Patient Stated Goals Walk further.    Currently in Pain? Yes    Pain Score 1     Pain Location Back    Pain Onset More than a month ago  PT Education - 08/20/20 1505    Education Details holding off ther-ex secondary to pt already taking 3 nitro tablets    Person(s) Educated Patient    Methods Explanation    Comprehension Verbalized understanding           Objective   Pt states ok to perform CPR and resuscitate him and call ambulance if an emergency happens.    Has angina, has nitroglycerine pills Takes 2-3 nitroglycerine pills a day.    No latex allergies   Observations: rest breaks needed secondary to fatigue and deconditioning.  MedbridgeAccess Code [email or web address]   No treadmillsper pt request    Manual therapy   Seated STM B lumbar paraspinal muscles to decrease tension       Response to treatment Pt tolerated session well without aggravation of symptoms.  Decreased blurred vision after session.  Clinical impression Did not  perform exercises today secondary to pt already taking 3 nitroglycerin pills today. Worked on manual therapy to decrease lumbar paraspinal muscles today to decrease back pain. Back felt good afterwards reported by pt. Pt will benefit from continued skilled physical therapy services to decrease pain, improve strength and function.       PT Short Term Goals - 07/16/20 2213      PT SHORT TERM GOAL #1   Title Patient will be independent with his initial home exercise program to improve strength, function, and ability to ambulate longer distances.    Time 3    Period Weeks    Status New    Target Date 08/06/20             PT Long Term Goals - 07/16/20 2214      PT LONG TERM GOAL #1   Title Patient will improve R hip strength by at least 1/2 MMT grade to promote ability to ambulate and perform standing tasks with less difficulty.    Time 8    Period Weeks    Status New    Target Date 09/10/20      PT LONG TERM GOAL #2   Title Pt will improve his 10 meter walk speed to at least 1 meter/second without AD to promote community ambulation.    Baseline 0.87 m/s average, no AD (07/16/2020)    Time 8    Period Weeks    Status New    Target Date 09/10/20      PT LONG TERM GOAL #3   Title Pt will improve his 5 times sit <> stand time to 11 seconds or less as a demonstration of improved functional strength.    Baseline 13 seconds (07/16/2020)    Time 8    Period Weeks    Status New    Target Date 09/10/20      PT LONG TERM GOAL #4   Title Pt will improve his FOTO score by at least 10 points as a demonstration of improved function.    Baseline Pt has not completed his initial FOTO yet (07/16/2020)    Time 8    Period Weeks    Status New    Target Date 09/10/20                 Plan - 08/20/20 1504    Clinical Impression Statement Did not perform exercises today secondary to pt already taking 3 nitroglycerin pills today. Worked on manual therapy to decrease lumbar paraspinal  muscles today to decrease back pain. Back felt good afterwards reported by pt. Pt will  benefit from continued skilled physical therapy services to decrease pain, improve strength and function.    Personal Factors and Comorbidities Comorbidity 3+;Fitness;Past/Current Experience    Comorbidities DM, HTN, PVD, CAD    Examination-Activity Limitations Sleep;Squat;Locomotion Level    Stability/Clinical Decision Making Stable/Uncomplicated    Rehab Potential Fair    PT Frequency 2x / week    PT Duration 8 weeks    PT Treatment/Interventions Therapeutic activities;Functional mobility training;Gait training;Stair training;Therapeutic exercise;Balance training;Neuromuscular re-education;Patient/family education;Manual techniques;Dry needling;Aquatic Therapy;Electrical Stimulation;Iontophoresis 4mg /ml Dexamethasone    PT Next Visit Plan hip strengthening, activity tolerance, balance, manual techniques, modalities PRN    Consulted and Agree with Plan of Care Patient           Patient will benefit from skilled therapeutic intervention in order to improve the following deficits and impairments:  Abnormal gait, Pain, Postural dysfunction, Improper body mechanics, Difficulty walking, Decreased strength, Decreased endurance  Visit Diagnosis: Difficulty in walking, not elsewhere classified     Problem List Patient Active Problem List   Diagnosis Date Noted  . NSTEMI (non-ST elevated myocardial infarction) [facility]) 01/28/2019     Joneen Boers PT, DPT  08/20/2020, 3:07 PM  [place] [facility] [address], [place], [address] Phone: [phone]   Fax:  [phone]  Name: BRAVLIO LUCA MRN: [id number] Date of Birth: 21-Oct-1950

## 2020-08-24 ENCOUNTER — Other Ambulatory Visit: Payer: Self-pay

## 2020-08-24 ENCOUNTER — Ambulatory Visit: Payer: BC Managed Care – PPO

## 2020-08-24 DIAGNOSIS — M25551 Pain in right hip: Secondary | ICD-10-CM | POA: Diagnosis not present

## 2020-08-24 DIAGNOSIS — M6281 Muscle weakness (generalized): Secondary | ICD-10-CM

## 2020-08-24 DIAGNOSIS — R262 Difficulty in walking, not elsewhere classified: Secondary | ICD-10-CM | POA: Diagnosis not present

## 2020-08-24 NOTE — Therapy (Signed)
[place] [facility] [address], [place], [address] Phone: [phone]   Fax:  [phone]  Physical Therapy Treatment  Patient Details  Name: Damon English MRN: [id number] Date of Birth: 1951-01-28 Referring Provider (PT): Charlcie Cradle, MD   Encounter Date: 08/24/2020   PT End of Session - 08/24/20 1346    Visit Number 8    Number of Visits 17    Date for PT Re-Evaluation 09/10/20    PT Start Time 1346    PT Stop Time 1429    PT Time Calculation (min) 43 min    Activity Tolerance Patient tolerated treatment well    Behavior During Therapy [facility] for tasks assessed/performed           Past Medical History:  Diagnosis Date  . Alcohol use    "cutting back" but still heavy and daily, multiple shots of bourbon each night  . Diabetes mellitus without complication ([place])   . History of tobacco use    reportedly quit in 2013  . Hypertension   . Pulmonary embolism ([place]) 2012   unprovoked  . PVD (peripheral vascular disease) ([place])     Past Surgical History:  Procedure Laterality Date  . BACK SURGERY    . CHOLECYSTECTOMY    . CORONARY ANGIOGRAPHY N/A 01/30/2019   Procedure: CORONARY ANGIOGRAPHY;  Surgeon: Corey Skains, MD;  Location: [place] [facility];  Service: Cardiovascular;  Laterality: N/A;  . ERCP W/ SPHICTEROTOMY  2015  . FEMORAL ARTERY - POPLITEAL ARTERY BYPASS GRAFT Right 06/19/2018  . THROMBOENDARTERECTOMY Right 06/19/2018  . TONSILLECTOMY    . VASCULAR SURGERY      There were no vitals filed for this visit.   Subjective Assessment - 08/24/20 1347    Subjective Has not had to take nitro today. Not too bad today for low back. Mainly has pain in R LE but used to it.    Pertinent History R femur osteochondroma. Had 2 heart attack this past May 2020. 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 Pt also has hx of vascular surgeries R LE which led to numbness.  Currently in PT  to improve R hip strength. Has numbness R medial thigh and R leg and foot secondary to vascular surgeries. Not as active as he used to be. 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  Pt used a rw after his surgery. Keeps a crutch in his truck just in case he needs it for long distances.  Had a really bad experience with a treadmill and does not want to use it. Pt fell and scared him. Able to ambulate without a crutch at home, about 50 ft to 100 ft. Stopped using his crutch about 1.5 weeks ago. Uses a rw to help him carry things. No falls within the last 6 months. No fear of falling. Has not had PT for his recent surgery.    Patient Stated Goals Walk further.    Currently in Pain? Other (Comment)   No pain level provide.   Pain Onset More than a month ago              [facility] PT Assessment - 08/24/20 1615      Observation/Other Assessments   Observations 5 times sit <> stand 10 seconds; 10 meter walk test: 17m/s  PT Education - 08/24/20 1355    Education Details ther-ex    Person(s) Educated Patient    Methods Explanation;Demonstration;Tactile cues;Verbal cues    Comprehension Returned demonstration;Verbalized understanding          Objective   Pt states ok to perform CPR and resuscitate him and call ambulance if an emergency happens.    Has angina, has nitroglycerine pills Takes 2-3 nitroglycerine pills a day.    No latex allergies   Observations: rest breaks needed secondary to fatigue and deconditioning.  MedbridgeAccess Code [email or web address]   No treadmillsper pt request    Therapeutic exercise  Standing hip flexor stretch with B UE assist   R 5x5-10 seconds   L 5x5-10 seconds   SLS with B UE assist             R 10x5 seconds for 2 sets             L 10x5 seconds for 2 sets    gait x 10 meters  10 seconds (79m/s)  Sit <> stand 5 times quickly   10 seconds   Standing hip abduction with B UE assist              R 10x3             L 10x3  standing glute max squeeze to promote hip flexor stretch 10x2 with 5 second holds   Gait around the gym  200 ft. Stopped secondary to fatigue and L low back pain.   Seated B scapular retraction to promote thoracic extension to decrease low back extension stress 10x2 with 5 seconds    Therapeutic rest breaks provided secondary to fatigue.   Improved exercise technique, movement at target joints, use of target muscles aftermin tomod verbal, visual, tactile cues.       Response to treatment Pt tolerated session well without aggravation of symptoms.  Clinical impression Pt demonstrates improved gait speed and functional strength with pt being able to ambulate at 1 m/s and able to stand up and sit down 5 times from a regular chair with B UE assist in 10 seconds compared to initial evaluation. Contiued working on glute strength to help decrease stress to low back during standing tasks as well. Pt will benefit from continued skilled physical therapy services to improve strength, endurance, and function.        PT Short Term Goals - 07/16/20 2213      PT SHORT TERM GOAL #1   Title Patient will be independent with his initial home exercise program to improve strength, function, and ability to ambulate longer distances.    Time 3    Period Weeks    Status New    Target Date 08/06/20             PT Long Term Goals - 07/16/20 2214      PT LONG TERM GOAL #1   Title Patient will improve R hip strength by at least 1/2 MMT grade to promote ability to ambulate and perform standing tasks with less difficulty.    Time 8    Period Weeks    Status New    Target Date 09/10/20      PT LONG TERM GOAL #2   Title Pt will improve his 10 meter walk speed to at least 1 meter/second without AD to promote  community ambulation.    Baseline 0.87 m/s average, no AD (07/16/2020)    Time 8    Period  Weeks    Status New    Target Date 09/10/20      PT LONG TERM GOAL #3   Title Pt will improve his 5 times sit <> stand time to 11 seconds or less as a demonstration of improved functional strength.    Baseline 13 seconds (07/16/2020)    Time 8    Period Weeks    Status New    Target Date 09/10/20      PT LONG TERM GOAL #4   Title Pt will improve his FOTO score by at least 10 points as a demonstration of improved function.    Baseline Pt has not completed his initial FOTO yet (07/16/2020)    Time 8    Period Weeks    Status New    Target Date 09/10/20                 Plan - 08/24/20 1420    Clinical Impression Statement Pt demonstrates improved gait speed and functional strength with pt being able to ambulate at 1 m/s and able to stand up and sit down 5 times from a regular chair with B UE assist in 10 seconds compared to initial evaluation. Contiued working on glute strength to help decrease stress to low back during standing tasks as well. Pt will benefit from continued skilled physical therapy services to improve strength, endurance, and function.    Personal Factors and Comorbidities Comorbidity 3+;Fitness;Past/Current Experience    Comorbidities DM, HTN, PVD, CAD    Examination-Activity Limitations Sleep;Squat;Locomotion Level    Stability/Clinical Decision Making Stable/Uncomplicated    Rehab Potential Fair    PT Frequency 2x / week    PT Duration 8 weeks    PT Treatment/Interventions Therapeutic activities;Functional mobility training;Gait training;Stair training;Therapeutic exercise;Balance training;Neuromuscular re-education;Patient/family education;Manual techniques;Dry needling;Aquatic Therapy;Electrical Stimulation;Iontophoresis 4mg /ml Dexamethasone    PT Next Visit Plan hip strengthening, activity tolerance, balance, manual techniques, modalities PRN    Consulted and Agree  with Plan of Care Patient           Patient will benefit from skilled therapeutic intervention in order to improve the following deficits and impairments:  Abnormal gait, Pain, Postural dysfunction, Improper body mechanics, Difficulty walking, Decreased strength, Decreased endurance  Visit Diagnosis: Difficulty in walking, not elsewhere classified  Muscle weakness (generalized)  Pain in right hip     Problem List Patient Active Problem List   Diagnosis Date Noted  . NSTEMI (non-ST elevated myocardial infarction) [facility]) 01/28/2019     Joneen Boers PT, DPT   08/24/2020, 4:16 PM  [place] [facility] [address], [place], [address] Phone: [phone]   Fax:  [phone]  Name: MUAAZ BRAU MRN: [id number] Date of Birth: 20-May-1951

## 2020-08-26 ENCOUNTER — Ambulatory Visit: Payer: BC Managed Care – PPO

## 2020-08-28 DIAGNOSIS — H25813 Combined forms of age-related cataract, bilateral: Secondary | ICD-10-CM | POA: Diagnosis not present

## 2020-08-31 ENCOUNTER — Ambulatory Visit: Payer: BC Managed Care – PPO

## 2020-09-03 ENCOUNTER — Other Ambulatory Visit: Payer: Self-pay

## 2020-09-03 ENCOUNTER — Ambulatory Visit: Payer: BC Managed Care – PPO

## 2020-09-03 DIAGNOSIS — M6281 Muscle weakness (generalized): Secondary | ICD-10-CM | POA: Diagnosis not present

## 2020-09-03 DIAGNOSIS — M25551 Pain in right hip: Secondary | ICD-10-CM | POA: Diagnosis not present

## 2020-09-03 DIAGNOSIS — R262 Difficulty in walking, not elsewhere classified: Secondary | ICD-10-CM | POA: Diagnosis not present

## 2020-09-03 NOTE — Therapy (Signed)
[place] [facility] [address], [place], [address] Phone: [phone]   Fax:  [phone]  Physical Therapy Treatment  Patient Details  Name: Damon English MRN: [id number] Date of Birth: [birth year] Referring Provider (PT): Charlcie Cradle, MD   Encounter Date: 09/03/2020   PT End of Session - 09/03/20 1517    Visit Number 9    Number of Visits 17    Date for PT Re-Evaluation 09/10/20    Authorization Type 9    Authorization Time Period of 10 progress report    PT Start Time 1517    PT Stop Time 1602    PT Time Calculation (min) 45 min    Activity Tolerance Patient tolerated treatment well    Behavior During Therapy [facility] for tasks assessed/performed           Past Medical History:  Diagnosis Date  . Alcohol use    "cutting back" but still heavy and daily, multiple shots of bourbon each night  . Diabetes mellitus without complication ([place])   . History of tobacco use    reportedly quit in 2013  . Hypertension   . Pulmonary embolism ([place]) 2012   unprovoked  . PVD (peripheral vascular disease) ([place])     Past Surgical History:  Procedure Laterality Date  . BACK SURGERY    . CHOLECYSTECTOMY    . CORONARY ANGIOGRAPHY N/A 01/30/2019   Procedure: CORONARY ANGIOGRAPHY;  Surgeon: Corey Skains, MD;  Location: [place] [facility];  Service: Cardiovascular;  Laterality: N/A;  . ERCP W/ SPHICTEROTOMY  2015  . FEMORAL ARTERY - POPLITEAL ARTERY BYPASS GRAFT Right 06/19/2018  . THROMBOENDARTERECTOMY Right 06/19/2018  . TONSILLECTOMY    . VASCULAR SURGERY      There were no vitals filed for this visit.   Subjective Assessment - 09/03/20 1518    Subjective Pt can't drive because of his vision. Going to have surgery. Had to take a nitro right before he left. Wife thinks its from excitement. 2-3/10 back pain L side currently.    Pertinent History R femur osteochondroma. Had 2 heart attack this past May  2020. 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 Pt also has hx of vascular surgeries R LE which led to numbness.  Currently in PT to improve R hip strength. Has numbness R medial thigh and R leg and foot secondary to vascular surgeries. Not as active as he used to be. 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  Pt used a rw after his surgery. Keeps a crutch in his truck just in case he needs it for long distances.  Had a really bad experience with a treadmill and does not want to use it. Pt fell and scared him. Able to ambulate without a crutch at home, about 50 ft to 100 ft. Stopped using his crutch about 1.5 weeks ago. Uses a rw to help him carry things. No falls within the last 6 months. No fear of falling. Has not had PT for his recent surgery.    Patient Stated Goals Walk further.    Currently in Pain? Yes    Pain Score 3     Pain Location Back    Pain Onset More than a month ago  PT Education - 09/03/20 1524    Education Details ther-ex    Person(s) Educated Patient    Methods Explanation;Demonstration;Tactile cues;Verbal cues    Comprehension Returned demonstration;Verbalized understanding          Objective   Pt states ok to perform CPR and resuscitate him and call ambulance if an emergency happens.    Has angina, has nitroglycerine pills Takes 2-3 nitroglycerine pills a day.    No latex allergies   Observations: rest breaks needed secondary to fatigue and deconditioning.  MedbridgeAccess Code [email or web address]   No treadmillsper pt request    Therapeutic exercise  Gait around gym 200 ft then break needed.   standing R lateral shift correction 10x2 for 5 seconds. Slight increase in L low back pain afterwards  Side stepping yellow band 20 ft to the R and 20 ft  to the L for 2 sets  Good glute med muscle use felt.   Forward wedding march 20 ft   SLS with B UE assist   R 10x5 seconds    Seated trunk flexion position. Slight decrease In L LE symptoms   Seated R trunk side bend position. Slight decrease in L leg symptoms.   Seated trunk flexion to the R position. Improved L leg comfort level.     Improved exercise technique, movement at target joints, use of target muscles after mod verbal, visual, tactile cues.      Manual therapy  Seated STM B lumbar paraspinal muscles to decrease tensionand stress to low back      Response to treatment Fair tolerance to today's session   Clinical impression Continued working on improving glute med strength as well as activity tolerance to promote function and decrease difficulty with gait. Worked on decreasing R lateral shift to improve proper posture. Increased L low back symptoms however which decreased with lumbar flexion (flexion preference). Pt will benefit from continued skilled physical therapy services to decreased pain, improve strength, endurance and function.        PT Short Term Goals - 07/16/20 2213      PT SHORT TERM GOAL #1   Title Patient will be independent with his initial home exercise program to improve strength, function, and ability to ambulate longer distances.    Time 3    Period Weeks    Status New    Target Date 08/06/20             PT Long Term Goals - 07/16/20 2214      PT LONG TERM GOAL #1   Title Patient will improve R hip strength by at least 1/2 MMT grade to promote ability to ambulate and perform standing tasks with less difficulty.    Time 8    Period Weeks    Status New    Target Date 09/10/20      PT LONG TERM GOAL #2   Title Pt will improve his 10 meter walk speed to at least 1 meter/second without AD to promote community ambulation.    Baseline 0.87 m/s average, no AD (07/16/2020)    Time 8    Period Weeks    Status New     Target Date 09/10/20      PT LONG TERM GOAL #3   Title Pt will improve his 5 times sit <> stand time to 11 seconds or less as a demonstration of improved functional strength.    Baseline 13 seconds (07/16/2020)    Time 8    Period Weeks  Status New    Target Date 09/10/20      PT LONG TERM GOAL #4   Title Pt will improve his FOTO score by at least 10 points as a demonstration of improved function.    Baseline Pt has not completed his initial FOTO yet (07/16/2020)    Time 8    Period Weeks    Status New    Target Date 09/10/20                 Plan - 09/03/20 1516    Clinical Impression Statement Continued working on improving glute med strength as well as activity tolerance to promote function and decrease difficulty with gait. Worked on decreasing R lateral shift to improve proper posture. Increased L low back symptoms however which decreased with lumbar flexion (flexion preference). Pt will benefit from continued skilled physical therapy services to decreased pain, improve strength, endurance and function.    Personal Factors and Comorbidities Comorbidity 3+;Fitness;Past/Current Experience    Comorbidities DM, HTN, PVD, CAD    Examination-Activity Limitations Sleep;Squat;Locomotion Level    Stability/Clinical Decision Making Stable/Uncomplicated    Rehab Potential Fair    PT Frequency 2x / week    PT Duration 8 weeks    PT Treatment/Interventions Therapeutic activities;Functional mobility training;Gait training;Stair training;Therapeutic exercise;Balance training;Neuromuscular re-education;Patient/family education;Manual techniques;Dry needling;Aquatic Therapy;Electrical Stimulation;Iontophoresis 4mg /ml Dexamethasone    PT Next Visit Plan hip strengthening, activity tolerance, balance, manual techniques, modalities PRN    Consulted and Agree with Plan of Care Patient           Patient will benefit from skilled therapeutic intervention in order to improve the following  deficits and impairments:  Abnormal gait, Pain, Postural dysfunction, Improper body mechanics, Difficulty walking, Decreased strength, Decreased endurance  Visit Diagnosis: Difficulty in walking, not elsewhere classified  Muscle weakness (generalized)  Pain in right hip     Problem List Patient Active Problem List   Diagnosis Date Noted  . NSTEMI (non-ST elevated myocardial infarction) [facility]) 01/28/2019    Joneen Boers PT, DPT   09/03/2020, 11:02 PM  [place] [facility] [address], [place], [address] Phone: [phone]   Fax:  [phone]  Name: Damon English MRN: [id number] Date of Birth: 1950/10/26

## 2020-09-07 ENCOUNTER — Ambulatory Visit: Payer: BC Managed Care – PPO

## 2020-09-08 ENCOUNTER — Other Ambulatory Visit: Payer: Self-pay

## 2020-09-08 ENCOUNTER — Ambulatory Visit: Payer: BC Managed Care – PPO

## 2020-09-08 DIAGNOSIS — M6281 Muscle weakness (generalized): Secondary | ICD-10-CM

## 2020-09-08 DIAGNOSIS — R262 Difficulty in walking, not elsewhere classified: Secondary | ICD-10-CM

## 2020-09-08 DIAGNOSIS — M25551 Pain in right hip: Secondary | ICD-10-CM | POA: Diagnosis not present

## 2020-09-08 NOTE — Therapy (Signed)
[facility] [address] [place], [address] Phone: [phone]   Fax:  [phone]  Physical Therapy Treatment And Progress Report (07/16/2020 - 09/08/2020)  Patient Details  Name: Damon English MRN: [id number] Date of Birth: 1951/08/05  Referring Provider (PT): Charlcie Cradle, MD   Encounter Date: 09/08/2020   PT End of Session - 09/08/20 1023    Visit Number 10    Number of Visits 27    Date for PT Re-Evaluation 10/15/20    Authorization Type 10    Authorization Time Period of 10 progress report    PT Start Time 1022    PT Stop Time 1105    PT Time Calculation (min) 43 min    Activity Tolerance Patient tolerated treatment well    Behavior During Therapy [facility] for tasks assessed/performed           Past Medical History:  Diagnosis Date  . Alcohol use    "cutting back" but still heavy and daily, multiple shots of bourbon each night  . Diabetes mellitus without complication ([place])   . History of tobacco use    reportedly quit in 2013  . Hypertension   . Pulmonary embolism ([place]) 2012   unprovoked  . PVD (peripheral vascular disease) ([place])     Past Surgical History:  Procedure Laterality Date  . BACK SURGERY    . CHOLECYSTECTOMY    . CORONARY ANGIOGRAPHY N/A 01/30/2019   Procedure: CORONARY ANGIOGRAPHY;  Surgeon: Corey Skains, MD;  Location: [facility];  Service: Cardiovascular;  Laterality: N/A;  . ERCP W/ SPHICTEROTOMY  2015  . FEMORAL ARTERY - POPLITEAL ARTERY BYPASS GRAFT Right 06/19/2018  . THROMBOENDARTERECTOMY Right 06/19/2018  . TONSILLECTOMY    . VASCULAR SURGERY      There were no vitals filed for this visit.   Subjective Assessment - 09/08/20 1025    Subjective Got eye glasses which helps him drive. L low back bothers him. R foot nerve damage pain feels like it is getting worse, R foot feels asleep almost all the time. Has been having that for years. Walking  on R foot bothers him, Feels like a peg leg.  Also picked up a heavy bag of dog food yesterday.    Pertinent History R femur osteochondroma. Had 2 heart attack this past May 2020. 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 Pt also has hx of vascular surgeries R LE which led to numbness.  Currently in PT to improve R hip strength. Has numbness R medial thigh and R leg and foot secondary to vascular surgeries. Not as active as he used to be. 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  Pt used a rw after his surgery. Keeps a crutch in his truck just in case he needs it for long distances.  Had a really bad experience with a treadmill and does not want to use it. Pt fell and scared him. Able to ambulate without a crutch at home, about 50 ft to 100 ft. Stopped using his crutch about 1.5 weeks ago. Uses a rw to help him carry things. No falls within the last 6 months. No fear of falling. Has not had PT for his recent surgery.    Patient Stated Goals Walk further.    Currently in Pain? Yes    Pain Score  3    Back pain in sitting.   Pain Location Back    Pain Onset More than a month ago              [facility] PT Assessment - 09/08/20 1042      Strength   Right Hip Flexion 4+/5    Right Hip Extension 5/5   seated manually resisted   Right Hip ABduction 4/5    Left Hip Extension --   seated manually resisted                                PT Education - 09/08/20 1039    Education Details ther-ex    Person(s) Educated Patient    Methods Explanation;Demonstration;Tactile cues;Verbal cues    Comprehension Returned demonstration;Verbalized understanding          Objective   Pt states ok to perform CPR and resuscitate him and call ambulance if an emergency happens.    Has angina, has nitroglycerine pills Takes 2-3 nitroglycerine pills a  day.    No latex allergies   Observations: rest breaks needed secondary to fatigue and deconditioning.  MedbridgeAccess Code [email or web address]   No treadmillsper pt request    08/24/2020: gait x 10 meters             10 seconds (36m/s)  Sit <> stand 5 times quickly              10 seconds      Pt states that his R hip is not giving him much problems. Just having L low back pain and R foot symptoms.   Therapeutic exercise   Seated hip extension isometrics  L 10x5 seconds for 3 sets  R 10x5 seconds for 3 sets  Seated manually resisted hip flexion, hip extension, S/L hip abduction 1-2x each way  Reviewed progress with hip strength with pt  S/L R hip abduction 10x2  Supine R hip extension isometrics, R leg straight, L knee bent 10x3 with 5 second holds to promote hip flexor stretch and R glute strength   Reviewed plan of care: continue PT for about 5 more weeks  Improved exercise technique, movement at target joints, use of target muscles after mod verbal, visual, tactile cues.    Manual therapy  Seated STM B lumbar paraspinal muscles to decrease tensionand stress to low back      Response to treatment Pt tolerated session well without aggravation of symptoms.   Clinical impression Pt demontrates overall improved gait speed, R hip strength and overall functional strength since initial evaluation. Overall function seems similar based on FOTO scores in which cardiovascular R LE vascular problems and back pain issues may play a factor. Pt will benefit from continued skilled physical therapy services to improve back pain, LE strength, function, and endurance.             PT Short Term Goals - 09/08/20 1301      PT SHORT TERM GOAL #1   Title Patient will be independent with his initial home exercise program to improve strength, function, and ability to ambulate longer distances.    Time 3    Period Weeks    Status On-going    Target  Date 08/06/20             PT Long Term Goals - 09/08/20 1302      PT LONG TERM GOAL #1   Title Patient  will improve R hip strength by at least 1/2 MMT grade to promote ability to ambulate and perform standing tasks with less difficulty.    Time 8    Period Weeks    Status Achieved    Target Date 09/10/20      PT LONG TERM GOAL #2   Title Pt will improve his 10 meter walk speed to at least 1 meter/second without AD to promote community ambulation.    Baseline 0.87 m/s average, no AD (07/16/2020).  42m/s (08/24/2020)    Time 8    Period Weeks    Status Achieved    Target Date 09/10/20      PT LONG TERM GOAL #3   Title Pt will improve his 5 times sit <> stand time to 11 seconds or less as a demonstration of improved functional strength.    Baseline 13 seconds (07/16/2020); 10 seconds (08/24/2020)    Time 8    Period Weeks    Status Achieved    Target Date 09/10/20      PT LONG TERM GOAL #4   Title Pt will improve his FOTO score by at least 10 points as a demonstration of improved function.    Baseline 46 (07/16/2020); 47 (09/08/2020)    Time 5    Period Weeks    Status On-going    Target Date 10/15/20                 Plan - 09/08/20 1040    Clinical Impression Statement Pt demontrates overall improved gait speed, R hip strength and overall functional strength since initial evaluation. Overall function seems similar based on FOTO scores in which cardiovascular R LE vascular problems and back pain issues may play a factor. Pt will benefit from continued skilled physical therapy services to improve back pain, LE strength, function, and endurance.    Personal Factors and Comorbidities Comorbidity 3+;Fitness;Past/Current Experience    Comorbidities DM, HTN, PVD, CAD    Examination-Activity Limitations Sleep;Squat;Locomotion Level    Stability/Clinical Decision Making Stable/Uncomplicated    Clinical Decision Making Low    Rehab Potential Fair    PT Frequency 2x / week     PT Duration Other (comment)   5 weeks   PT Treatment/Interventions Therapeutic activities;Functional mobility training;Gait training;Stair training;Therapeutic exercise;Balance training;Neuromuscular re-education;Patient/family education;Manual techniques;Dry needling;Aquatic Therapy;Electrical Stimulation;Iontophoresis 4mg /ml Dexamethasone    PT Next Visit Plan hip strengthening, activity tolerance, balance, manual techniques, modalities PRN    Consulted and Agree with Plan of Care Patient           Patient will benefit from skilled therapeutic intervention in order to improve the following deficits and impairments:  Abnormal gait, Pain, Postural dysfunction, Improper body mechanics, Difficulty walking, Decreased strength, Decreased endurance  Visit Diagnosis: Difficulty in walking, not elsewhere classified - Plan: PT plan of care cert/re-cert  Muscle weakness (generalized) - Plan: PT plan of care cert/re-cert  Pain in right hip - Plan: PT plan of care cert/re-cert     Problem List Patient Active Problem List   Diagnosis Date Noted  . NSTEMI (non-ST elevated myocardial infarction) ([place]) 01/28/2019    Thank you for your referral.  Joneen Boers PT, DPT   09/08/2020, 1:09 PM  [facility] [address] [place], [address] Phone: [phone]   Fax:  [phone]  Name: Damon English MRN: [id number] Date of Birth: 24-Jul-1951

## 2020-09-16 ENCOUNTER — Ambulatory Visit: Payer: BC Managed Care – PPO | Attending: Orthopedic Surgery

## 2020-09-16 ENCOUNTER — Other Ambulatory Visit: Payer: Self-pay

## 2020-09-16 DIAGNOSIS — M6281 Muscle weakness (generalized): Secondary | ICD-10-CM | POA: Insufficient documentation

## 2020-09-16 DIAGNOSIS — R262 Difficulty in walking, not elsewhere classified: Secondary | ICD-10-CM | POA: Diagnosis not present

## 2020-09-16 DIAGNOSIS — M25551 Pain in right hip: Secondary | ICD-10-CM | POA: Diagnosis not present

## 2020-09-16 NOTE — Therapy (Signed)
[facility] [address], [place], [address] Phone: [phone]   Fax:  [phone]  Physical Therapy Treatment  Patient Details  Name: Damon English MRN: [id number] Date of Birth: Aug 10, 1951 Referring Provider (PT): Charlcie Cradle, MD   Encounter Date: 09/16/2020   PT End of Session - 09/16/20 1301    Visit Number 11    Number of Visits 27    Date for PT Re-Evaluation 10/15/20    Authorization Type 1    Authorization Time Period of 10 progress report    PT Start Time 1301    PT Stop Time 1345    PT Time Calculation (min) 44 min    Activity Tolerance Patient tolerated treatment well    Behavior During Therapy [facility] for tasks assessed/performed           Past Medical History:  Diagnosis Date  . Alcohol use    "cutting back" but still heavy and daily, multiple shots of bourbon each night  . Diabetes mellitus without complication ([place])   . History of tobacco use    reportedly quit in 2013  . Hypertension   . Pulmonary embolism ([place]) 2012   unprovoked  . PVD (peripheral vascular disease) ([place])     Past Surgical History:  Procedure Laterality Date  . BACK SURGERY    . CHOLECYSTECTOMY    . CORONARY ANGIOGRAPHY N/A 01/30/2019   Procedure: CORONARY ANGIOGRAPHY;  Surgeon: Corey Skains, MD;  Location: [facility];  Service: Cardiovascular;  Laterality: N/A;  . ERCP W/ SPHICTEROTOMY  2015  . FEMORAL ARTERY - POPLITEAL ARTERY BYPASS GRAFT Right 06/19/2018  . THROMBOENDARTERECTOMY Right 06/19/2018  . TONSILLECTOMY    . VASCULAR SURGERY      There were no vitals filed for this visit.   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 Feels like he is walking on a peg. Goes back to his vascular doctor this month. Every time his R leg is checked, there were no clots. He has nerve damage. L low back has been bothering him for the  past few days. 5/10 L low back pain when pt stands up to walk.    Pertinent History R femur osteochondroma. Had 2 heart attack this past May 2020. 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 Pt also has hx of vascular surgeries R LE which led to numbness.  Currently in PT to improve R hip strength. Has numbness R medial thigh and R leg and foot secondary to vascular surgeries. Not as active as he used to be. 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  Pt used a rw after his surgery. Keeps a crutch in his truck just in case he needs it for long distances.  Had a really bad experience with a treadmill and does not want to use it. Pt fell and scared him. Able to ambulate without a crutch at home, about 50 ft to 100 ft. Stopped using his crutch about 1.5 weeks ago. Uses a rw to help him carry things. No falls within the last 6 months. No fear of falling. Has not had PT for his recent surgery.    Patient Stated Goals Walk further.    Currently in Pain? Yes    Pain Score 5  Pain Onset More than a month ago                                     PT Education - 09/16/20 1328    Education Details ther-ex    [employer]) Educated Patient    Methods Explanation;Demonstration;Tactile cues;Verbal cues    Comprehension Returned demonstration;Verbalized understanding             Objective   Pt states ok to perform CPR and resuscitate him and call ambulance if an emergency happens.    Has angina, has nitroglycerine pills Takes 2-3 nitroglycerine pills a day.     No latex allergies   Observations: rest breaks needed secondary to fatigue and deconditioning.  MedbridgeAccess Code [email or web address]   No treadmillsper pt request     Pt states that his R hip is not giving him much problems. Just having L low back pain and R foot  symptoms.   Therapeutic exercise   Standing glute max squeeze 10x10 seconds, then 5x10 seconds   L low back discomfort  Seated manually resisted L lateral shift isometrics to promote more neutral lumbar posture 10x5 seconds for 3 sets  Seated B scapular retraction 10x5 seconds. L low back discomfort.   Then with feet propped on 4 inch step 10x2 with 5 second holds (no back pain during exercise)  To promote thoracic extension and decrease low back extension stress  Seated trunk flexion to the R 10x5 seconds   Then to the L 10x5 seconds for 2 sets  Gait around gym 200 ft x 2  Pt averages 200 ft. Rest break secondary to being "short winded"  Improved exercise technique, movement at target joints, use of target muscles after mod verbal, visual, tactile cues.   Manual therapy  Seated STM B lumbar paraspinal muscles to decrease tensionand stress to low back  Decreased back pain with sit to stand initially.      Response to treatment Pt tolerated session well without aggravation of symptoms.   Clinical impression Worked on improving hip extension, thoracic extension as well as decreasing lumbar paraspinal muscle tension to decrease L low back extension stress. Pt averages 200 ft with gait which seems to be limited by cardiovascular issues. Pt tolerated session well without aggravation of symptoms. Pt will benefit from continued skilled physical therapy services to decrease pain, improve strength, endurance and function.          PT Short Term Goals - 09/08/20 1301      PT SHORT TERM GOAL #1   Title Patient will be independent with his initial home exercise program to improve strength, function, and ability to ambulate longer distances.    Time 3    Period Weeks    Status On-going    Target Date 08/06/20             PT Long Term Goals - 09/08/20 1302      PT LONG TERM GOAL #1   Title Patient will improve R hip strength by at least 1/2 MMT grade to  promote ability to ambulate and perform standing tasks with less difficulty.    Time 8    Period Weeks    Status Achieved    Target Date 09/10/20      PT LONG TERM GOAL #2   Title Pt will improve his 10 meter walk speed to at least 1 meter/second without AD to promote  community ambulation.    Baseline 0.87 m/s average, no AD (07/16/2020).  6m/s (08/24/2020)    Time 8    Period Weeks    Status Achieved    Target Date 09/10/20      PT LONG TERM GOAL #3   Title Pt will improve his 5 times sit <> stand time to 11 seconds or less as a demonstration of improved functional strength.    Baseline 13 seconds (07/16/2020); 10 seconds (08/24/2020)    Time 8    Period Weeks    Status Achieved    Target Date 09/10/20      PT LONG TERM GOAL #4   Title Pt will improve his FOTO score by at least 10 points as a demonstration of improved function.    Baseline 46 (07/16/2020); 47 (09/08/2020)    Time 5    Period Weeks    Status On-going    Target Date 10/15/20                 Plan - 09/16/20 1259    Clinical Impression Statement Worked on improving hip extension, thoracic extension as well as decreasing lumbar paraspinal muscle tension to decrease L low back extension stress. Pt averages 200 ft with gait which seems to be limited by cardiovascular issues. Pt tolerated session well without aggravation of symptoms. Pt will benefit from continued skilled physical therapy services to decrease pain, improve strength, endurance and function.    Personal Factors and Comorbidities Comorbidity 3+;Fitness;Past/Current Experience    Comorbidities DM, HTN, PVD, CAD    Examination-Activity Limitations Sleep;Squat;Locomotion Level    Stability/Clinical Decision Making Stable/Uncomplicated    Rehab Potential Fair    PT Frequency 2x / week    PT Duration Other (comment)   5 weeks   PT Treatment/Interventions Therapeutic activities;Functional mobility training;Gait training;Stair training;Therapeutic  exercise;Balance training;Neuromuscular re-education;Patient/family education;Manual techniques;Dry needling;Aquatic Therapy;Electrical Stimulation;Iontophoresis 4mg /ml Dexamethasone    PT Next Visit Plan hip strengthening, activity tolerance, balance, manual techniques, modalities PRN    Consulted and Agree with Plan of Care Patient           Patient will benefit from skilled therapeutic intervention in order to improve the following deficits and impairments:  Abnormal gait, Pain, Postural dysfunction, Improper body mechanics, Difficulty walking, Decreased strength, Decreased endurance  Visit Diagnosis: Difficulty in walking, not elsewhere classified  Muscle weakness (generalized)  Pain in right hip     Problem List Patient Active Problem List   Diagnosis Date Noted  . NSTEMI (non-ST elevated myocardial infarction) [facility]) 01/28/2019    Joneen Boers PT, DPT   09/16/2020, 3:06 PM  [facility] [address] [place], [address] Phone: [phone]   Fax:  [phone]  Name: Damon English MRN: [id number] Date of Birth: 09/01/1951

## 2020-09-22 ENCOUNTER — Other Ambulatory Visit: Payer: Self-pay

## 2020-09-22 ENCOUNTER — Ambulatory Visit: Payer: BC Managed Care – PPO

## 2020-09-22 DIAGNOSIS — R262 Difficulty in walking, not elsewhere classified: Secondary | ICD-10-CM

## 2020-09-22 DIAGNOSIS — M25551 Pain in right hip: Secondary | ICD-10-CM | POA: Diagnosis not present

## 2020-09-22 DIAGNOSIS — M6281 Muscle weakness (generalized): Secondary | ICD-10-CM | POA: Diagnosis not present

## 2020-09-22 NOTE — Therapy (Signed)
[facility] [address], [place], [address] Phone: [phone]   Fax:  [phone]  Physical Therapy Treatment  Patient Details  Name: Damon English MRN: [id number] Date of Birth: 02-03-1951 Referring Provider (PT): Charlcie Cradle, MD   Encounter Date: 09/22/2020   PT End of Session - 09/22/20 1119    Visit Number 12    Number of Visits 27    Date for PT Re-Evaluation 10/15/20    Authorization Type 2    Authorization Time Period of 10 progress report    PT Start Time 1119    PT Stop Time 1202    PT Time Calculation (min) 43 min    Activity Tolerance Patient tolerated treatment well    Behavior During Therapy [facility] for tasks assessed/performed           Past Medical History:  Diagnosis Date  . Alcohol use    "cutting back" but still heavy and daily, multiple shots of bourbon each night  . Diabetes mellitus without complication ([place])   . History of tobacco use    reportedly quit in 2013  . Hypertension   . Pulmonary embolism ([place]) 2012   unprovoked  . PVD (peripheral vascular disease) ([place])     Past Surgical History:  Procedure Laterality Date  . BACK SURGERY    . CHOLECYSTECTOMY    . CORONARY ANGIOGRAPHY N/A 01/30/2019   Procedure: CORONARY ANGIOGRAPHY;  Surgeon: Corey Skains, MD;  Location: [facility];  Service: Cardiovascular;  Laterality: N/A;  . ERCP W/ SPHICTEROTOMY  2015  . FEMORAL ARTERY - POPLITEAL ARTERY BYPASS GRAFT Right 06/19/2018  . THROMBOENDARTERECTOMY Right 06/19/2018  . TONSILLECTOMY    . VASCULAR SURGERY      There were no vitals filed for this visit.   Subjective Assessment - 09/22/20 1120    Subjective Back is not too bad today. Always bothers him some. Today is not bad. had to take a nitro thig morning and had not taken one in 3-4 days. Ate a pizza last night and tomato based stuff tends to do it.    Pertinent History R femur osteochondroma. Had 2  heart attack this past May 2020. 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 Pt also has hx of vascular surgeries R LE which led to numbness.  Currently in PT to improve R hip strength. Has numbness R medial thigh and R leg and foot secondary to vascular surgeries. Not as active as he used to be. 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  Pt used a rw after his surgery. Keeps a crutch in his truck just in case he needs it for long distances.  Had a really bad experience with a treadmill and does not want to use it. Pt fell and scared him. Able to ambulate without a crutch at home, about 50 ft to 100 ft. Stopped using his crutch about 1.5 weeks ago. Uses a rw to help him carry things. No falls within the last 6 months. No fear of falling. Has not had PT for his recent surgery.    Patient Stated Goals Walk further.    Currently in Pain? No/denies    Pain Onset More than a month ago  PT Education - 09/22/20 1125    Education Details ther-ex    Person(s) Educated Patient    Methods Explanation;Demonstration;Tactile cues;Verbal cues    Comprehension Returned demonstration;Verbalized understanding            Objective   Pt states ok to perform CPR and resuscitate him and call ambulance if an emergency happens.    Has angina, has nitroglycerine pills Takes 2-3 nitroglycerine pills a day.     No latex allergies   Observations: rest breaks needed secondary to fatigue and deconditioning.  MedbridgeAccess Code [email or web address]   No treadmillsper pt request     Pt states that his R hip is not giving him much problems. Just having L low back pain and R foot symptoms.  Therapeutic exercise  Gait around gym 200 ft x 2  Short of breath at end of 2nd set.   Forward step up onto  4 inch step with R LE with B UE assist 10x2  Seated hip extension isometrics  L 10x5 seconds. Increased low back pain  R 10x5 seconds for 2 sets. No increase in back pain. Good R quad muscle work felt.   Standing glute max squeeze and transversus abdominis contraction 10x5 seconds     Increased L low back pain with standing activities   Seated trunk flexion stretch 1 minute x 2  Back feels better but feels lightheaded afterwards  Blood pressure L arm sitting, mechanically taken, normal cuff: 121/56, HR 68  Pt states the lightheadedness is better. Pt however did not eat breakfast.   Pt was provided crackers.     Improved exercise technique, movement at target joints, use of target muscles after mod verbal, visual, tactile cues.   Manual therapy   Seated STM B lumbar paraspinal muscles to decrease tensionand stress to low back          Response to treatment Fair tolerance to today's session. Increased L low back pain with standing activities in general.   Clinical impression Continued working on improving endurance and activity tolerance, as well as improving R LE strength to promote ability to perform standing tasks with less difficulty. Pt continues to average 200 ft gait prior to sitting rest in which cardiovascular factors may play a role. Difficulty performing standing activities secondary to L low back pain. Fair tolerance to today's session. Pt will benefit from continued skilled physical therapy services to improve strength, endurance, and function.       PT Short Term Goals - 09/08/20 1301      PT SHORT TERM GOAL #1   Title Patient will be independent with his initial home exercise program to improve strength, function, and ability to ambulate longer distances.    Time 3    Period Weeks    Status On-going    Target Date 08/06/20             PT Long Term Goals - 09/08/20 1302      PT LONG TERM GOAL #1   Title Patient will improve R hip strength  by at least 1/2 MMT grade to promote ability to ambulate and perform standing tasks with less difficulty.    Time 8    Period Weeks    Status Achieved    Target Date 09/10/20      PT LONG TERM GOAL #2   Title Pt will improve his 10 meter walk speed to at least 1 meter/second without AD to promote community ambulation.    Baseline 0.87 m/s  average, no AD (07/16/2020).  84m/s (08/24/2020)    Time 8    Period Weeks    Status Achieved    Target Date 09/10/20      PT LONG TERM GOAL #3   Title Pt will improve his 5 times sit <> stand time to 11 seconds or less as a demonstration of improved functional strength.    Baseline 13 seconds (07/16/2020); 10 seconds (08/24/2020)    Time 8    Period Weeks    Status Achieved    Target Date 09/10/20      PT LONG TERM GOAL #4   Title Pt will improve his FOTO score by at least 10 points as a demonstration of improved function.    Baseline 46 (07/16/2020); 47 (09/08/2020)    Time 5    Period Weeks    Status On-going    Target Date 10/15/20                 Plan - 09/22/20 1125    Clinical Impression Statement Continued working on improving endurance and activity tolerance, as well as improving R LE strength to promote ability to perform standing tasks with less difficulty. Pt continues to average 200 ft gait prior to sitting rest in which cardiovascular factors may play a role. Difficulty performing standing activities secondary to L low back pain. Fair tolerance to today's session. Pt will benefit from continued skilled physical therapy services to improve strength, endurance, and function.    Personal Factors and Comorbidities Comorbidity 3+;Fitness;Past/Current Experience    Comorbidities DM, HTN, PVD, CAD    Examination-Activity Limitations Sleep;Squat;Locomotion Level    Stability/Clinical Decision Making Stable/Uncomplicated    Rehab Potential Fair    PT Frequency 2x / week    PT Duration Other (comment)   5 weeks   PT  Treatment/Interventions Therapeutic activities;Functional mobility training;Gait training;Stair training;Therapeutic exercise;Balance training;Neuromuscular re-education;Patient/family education;Manual techniques;Dry needling;Aquatic Therapy;Electrical Stimulation;Iontophoresis 4mg /ml Dexamethasone    PT Next Visit Plan hip strengthening, activity tolerance, balance, manual techniques, modalities PRN    Consulted and Agree with Plan of Care Patient           Patient will benefit from skilled therapeutic intervention in order to improve the following deficits and impairments:  Abnormal gait, Pain, Postural dysfunction, Improper body mechanics, Difficulty walking, Decreased strength, Decreased endurance  Visit Diagnosis: Difficulty in walking, not elsewhere classified  Muscle weakness (generalized)     Problem List Patient Active Problem List   Diagnosis Date Noted  . NSTEMI (non-ST elevated myocardial infarction) [facility]) 01/28/2019     Joneen Boers PT, DPT   09/22/2020, 12:40 PM  [facility] [address], [place], [address] Phone: [phone]   Fax:  [phone]  Name: Damon English MRN: [id number] Date of Birth: 03-Jan-1951

## 2020-09-24 ENCOUNTER — Ambulatory Visit: Payer: BC Managed Care – PPO

## 2020-09-24 ENCOUNTER — Other Ambulatory Visit: Payer: Self-pay

## 2020-09-24 DIAGNOSIS — M25551 Pain in right hip: Secondary | ICD-10-CM | POA: Diagnosis not present

## 2020-09-24 DIAGNOSIS — M6281 Muscle weakness (generalized): Secondary | ICD-10-CM

## 2020-09-24 DIAGNOSIS — R262 Difficulty in walking, not elsewhere classified: Secondary | ICD-10-CM | POA: Diagnosis not present

## 2020-09-24 NOTE — Therapy (Signed)
[facility] [address], [place], [address] Phone: [phone]   Fax:  [phone]  Physical Therapy Treatment  Patient Details  Name: Damon English MRN: [id number] Date of Birth: 03-12-1951 Referring Provider (PT): Charlcie Cradle, MD   Encounter Date: 09/24/2020   PT End of Session - 09/24/20 1335    Visit Number 13    Number of Visits 27    Date for PT Re-Evaluation 10/15/20    Authorization Type 3    Authorization Time Period of 10 progress report    PT Start Time 1335    PT Stop Time 1415    PT Time Calculation (min) 40 min    Activity Tolerance Patient tolerated treatment well    Behavior During Therapy [facility] for tasks assessed/performed           Past Medical History:  Diagnosis Date  . Alcohol use    "cutting back" but still heavy and daily, multiple shots of bourbon each night  . Diabetes mellitus without complication ([place])   . History of tobacco use    reportedly quit in 2013  . Hypertension   . Pulmonary embolism ([place]) 2012   unprovoked  . PVD (peripheral vascular disease) ([place])     Past Surgical History:  Procedure Laterality Date  . BACK SURGERY    . CHOLECYSTECTOMY    . CORONARY ANGIOGRAPHY N/A 01/30/2019   Procedure: CORONARY ANGIOGRAPHY;  Surgeon: Corey Skains, MD;  Location: [facility];  Service: Cardiovascular;  Laterality: N/A;  . ERCP W/ SPHICTEROTOMY  2015  . FEMORAL ARTERY - POPLITEAL ARTERY BYPASS GRAFT Right 06/19/2018  . THROMBOENDARTERECTOMY Right 06/19/2018  . TONSILLECTOMY    . VASCULAR SURGERY      There were no vitals filed for this visit.   Subjective Assessment - 09/24/20 1337    Subjective Patient reported that he is doing better with his hip, but his back is still bothering him. Reported that feels he leans a little.    Pertinent History R femur osteochondroma. Had 2 heart attack this past May 2020. Currently S/P R hip surgery to  remove tumor on 05/01/2020. Pt also has hx of vascular surgeries R LE which led to numbness.  Currently in PT to improve R hip strength. Has numbness R medial thigh and R leg and foot secondary to vascular surgeries. Not as active as he used to be. 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  Pt used a rw after his surgery. Keeps a crutch in his truck just in case he needs it for long distances.  Had a really bad experience with a treadmill and does not want to use it. Pt fell and scared him. Able to ambulate without a crutch at home, about 50 ft to 100 ft. Stopped using his crutch about 1.5 weeks ago. Uses a rw to help him carry things. No falls within the last 6 months. No fear of falling. Has not had PT for his recent surgery.    Patient Stated Goals Walk further.    Currently in Pain? Other (Comment)   R leg 2-3/10            Objective    Pt states ok to perform CPR and resuscitate him and call ambulance if an emergency happens.    Has angina, has nitroglycerine pills  Takes 2-3 nitroglycerine pills a day.    No latex allergies    Observations: rest breaks needed secondary to fatigue and deconditioning.    Medbridge Access Code [email or web address]    No treadmills per pt request    Pt states that his R hip is not giving him much problems. Just having L low back pain and R foot symptoms.    Therapeutic exercise    Gait around gym 200 ft x 2               Short of breath at end of 2nd set.    Forward step up onto 4 inch step with R LE with B UE assist 10x2   Seated hip extension isometrics               R 10x5 seconds for 2 sets. No increase in back pain. Good R quad muscle work felt.    Standing glute max squeeze and transversus abdominis contraction 10x5 seconds                     Increased L low back pain with standing activities     Seated trunk flexion  stretch 1 minute x 2               Back feels better but feels lightheaded afterwards    Improved exercise technique, movement at target joints, use of target muscles after mod verbal, visual, tactile cues.      Response to treatment   Pt response/clinical impression: Good tolerance to today's session. Increased L low back pain with standing activities in general, 2-3/10 low back pain at end of session. The patient was able to ambulate several laps in gym with seated rest breaks in between bouts, spO2 >97% throughout. The patient would benefit from further skilled PT intervention to continue to progress towards goals and maximize function.        PT Education - 09/24/20 1335    Education Details therex    Person(s) Educated Patient    Methods Explanation;Tactile cues;Demonstration;Verbal cues    Comprehension Verbalized understanding;Returned demonstration            PT Short Term Goals - 09/08/20 1301      PT SHORT TERM GOAL #1   Title Patient will be independent with his initial home exercise program to improve strength, function, and ability to ambulate longer distances.    Time 3    Period Weeks    Status On-going    Target Date 08/06/20             PT Long Term Goals - 09/08/20 1302      PT LONG TERM GOAL #1   Title Patient will improve R hip strength by at least 1/2 MMT grade to promote ability to ambulate and perform standing tasks with less difficulty.    Time 8    Period Weeks    Status Achieved    Target Date 09/10/20      PT LONG TERM GOAL #2   Title Pt will improve his 10 meter walk speed to at least 1 meter/second without AD to promote community ambulation.    Baseline 0.87 m/s average, no AD (07/16/2020).  59m/s (08/24/2020)    Time 8    Period Weeks    Status Achieved    Target Date 09/10/20      PT LONG TERM GOAL #3   Title Pt will improve his 5 times sit <> stand  time to 11 seconds or less as a demonstration of improved functional strength.     Baseline 13 seconds (07/16/2020); 10 seconds (08/24/2020)    Time 8    Period Weeks    Status Achieved    Target Date 09/10/20      PT LONG TERM GOAL #4   Title Pt will improve his FOTO score by at least 10 points as a demonstration of improved function.    Baseline 46 (07/16/2020); 47 (09/08/2020)    Time 5    Period Weeks    Status On-going    Target Date 10/15/20                 Plan - 09/24/20 1418    Clinical Impression Statement Good tolerance to today's session. Increased L low back pain with standing activities in general, 2-3/10 low back pain at end of session. The patient was able to ambulate several laps in gym with seated rest breaks in between bouts, spO2 >97% throughout. The patient would benefit from further skilled PT intervention to continue to progress towards goals and maximize function.    Personal Factors and Comorbidities Comorbidity 3+;Fitness;Past/Current Experience    Comorbidities DM, HTN, PVD, CAD    Examination-Activity Limitations Sleep;Squat;Locomotion Level    Stability/Clinical Decision Making Stable/Uncomplicated    Rehab Potential Fair    PT Frequency 2x / week    PT Duration Other (comment)   5 weeks   PT Treatment/Interventions Therapeutic activities;Functional mobility training;Gait training;Stair training;Therapeutic exercise;Balance training;Neuromuscular re-education;Patient/family education;Manual techniques;Dry needling;Aquatic Therapy;Electrical Stimulation;Iontophoresis 4mg /ml Dexamethasone    PT Next Visit Plan hip strengthening, activity tolerance, balance, manual techniques, modalities PRN    Consulted and Agree with Plan of Care Patient           Patient will benefit from skilled therapeutic intervention in order to improve the following deficits and impairments:  Abnormal gait,Pain,Postural dysfunction,Improper body mechanics,Difficulty walking,Decreased strength,Decreased endurance  Visit Diagnosis: Difficulty in walking, not  elsewhere classified  Muscle weakness (generalized)  Pain in right hip     Problem List Patient Active Problem List   Diagnosis Date Noted  . NSTEMI (non-ST elevated myocardial infarction) ([place]) 01/28/2019    Lieutenant Diego 09/24/2020, 2:20 PM  [facility] [address] [place], [address] Phone: [phone]   Fax:  [phone]  Name: ELDREDGE VELDHUIZEN MRN: [id number] Date of Birth: Dec 16, 1950

## 2020-09-28 ENCOUNTER — Ambulatory Visit: Payer: BC Managed Care – PPO

## 2020-10-02 DIAGNOSIS — M1612 Unilateral primary osteoarthritis, left hip: Secondary | ICD-10-CM | POA: Diagnosis not present

## 2020-10-02 DIAGNOSIS — Z981 Arthrodesis status: Secondary | ICD-10-CM | POA: Diagnosis not present

## 2020-10-02 DIAGNOSIS — D1621 Benign neoplasm of long bones of right lower limb: Secondary | ICD-10-CM | POA: Diagnosis not present

## 2020-10-05 DIAGNOSIS — I70229 Atherosclerosis of native arteries of extremities with rest pain, unspecified extremity: Secondary | ICD-10-CM | POA: Diagnosis not present

## 2020-10-05 DIAGNOSIS — I82401 Acute embolism and thrombosis of unspecified deep veins of right lower extremity: Secondary | ICD-10-CM | POA: Diagnosis not present

## 2020-10-07 DIAGNOSIS — H401134 Primary open-angle glaucoma, bilateral, indeterminate stage: Secondary | ICD-10-CM | POA: Insufficient documentation

## 2020-10-07 DIAGNOSIS — H25813 Combined forms of age-related cataract, bilateral: Secondary | ICD-10-CM | POA: Insufficient documentation

## 2020-10-07 DIAGNOSIS — E119 Type 2 diabetes mellitus without complications: Secondary | ICD-10-CM | POA: Diagnosis not present

## 2020-10-12 ENCOUNTER — Ambulatory Visit: Payer: BC Managed Care – PPO

## 2020-10-15 ENCOUNTER — Ambulatory Visit: Payer: BC Managed Care – PPO

## 2020-10-15 ENCOUNTER — Other Ambulatory Visit: Payer: Self-pay

## 2020-10-15 DIAGNOSIS — R262 Difficulty in walking, not elsewhere classified: Secondary | ICD-10-CM | POA: Diagnosis not present

## 2020-10-15 DIAGNOSIS — M6281 Muscle weakness (generalized): Secondary | ICD-10-CM | POA: Diagnosis not present

## 2020-10-15 DIAGNOSIS — M25551 Pain in right hip: Secondary | ICD-10-CM

## 2020-10-15 NOTE — Therapy (Signed)
[facility] [address] [place], [place], [address] Phone: [phone]   Fax:  [phone]  Physical Therapy Treatment And Discharge Summary  Patient Details  Name: Damon English MRN: [id number] Date of Birth: 07-08-1951 Referring Provider (PT): Charlcie Cradle, MD   Encounter Date: 10/15/2020   PT End of Session - 10/15/20 1349    Visit Number 14    Number of Visits 27    Date for PT Re-Evaluation 10/15/20    Authorization Type 4    Authorization Time Period of 10 progress report    PT Start Time 1349    PT Stop Time [id number]    PT Time Calculation (min) 45 min    Activity Tolerance Patient tolerated treatment well    Behavior During Therapy [facility] for tasks assessed/performed           Past Medical History:  Diagnosis Date   Alcohol use    "cutting back" but still heavy and daily, multiple shots of bourbon each night   Diabetes mellitus without complication ([place])    History of tobacco use    reportedly quit in 2013   Hypertension    Pulmonary embolism ([place]) 2012   unprovoked   PVD (peripheral vascular disease) ([place])     Past Surgical History:  Procedure Laterality Date   BACK SURGERY     CHOLECYSTECTOMY     CORONARY ANGIOGRAPHY N/A 01/30/2019   Procedure: CORONARY ANGIOGRAPHY;  Surgeon: Corey Skains, MD;  Location: [place] [facility];  Service: Cardiovascular;  Laterality: N/A;   ERCP W/ SPHICTEROTOMY  2015   FEMORAL ARTERY - POPLITEAL ARTERY BYPASS GRAFT Right 06/19/2018   THROMBOENDARTERECTOMY Right 06/19/2018   TONSILLECTOMY     VASCULAR SURGERY      There were no vitals filed for this visit.   Subjective Assessment - 10/15/20 1349    Subjective Has had to take more nitro recently. Might be due to the holidays. R foot and leg feels like is getting worse. Saw a new vascular doctor last week. Did not feel like his issue was addressed. Did an [place] on his leg last week. Did  not find anything, no DVT. The pulse is  good. Does not have feeling in R foot. R hip is fine. Mainly his back bothers him and his R foot/leg bothers him because of the numbness.    Pertinent History R femur osteochondroma. Had 2 heart attack this past May 2020. 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 Pt also has hx of vascular surgeries R LE which led to numbness.  Currently in PT to improve R hip strength. Has numbness R medial thigh and R leg and foot secondary to vascular surgeries. Not as active as he used to be. 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  Pt used a rw after his surgery. Keeps a crutch in his truck just in case he needs it for long distances.  Had a really bad experience with a treadmill and does not want to use it. Pt fell and scared him. Able to ambulate without a crutch at home, about 50 ft to 100 ft. Stopped using his crutch about 1.5 weeks ago. Uses a rw to help him carry things. No falls within the last 6 months. No fear of falling. Has not had PT for his recent surgery.  Patient Stated Goals Walk further.    Currently in Pain? No/denies                                     PT Education - 10/15/20 1353    Education Details ther-ex    Person(s) Educated Patient    Methods Explanation;Demonstration;Tactile cues;Verbal cues    Comprehension Verbalized understanding;Returned demonstration          Objective    Pt states ok to perform CPR and resuscitate him and call ambulance if an emergency happens.    Has angina, has nitroglycerine pills   Takes 2-3 nitroglycerine pills a day.    No latex allergies    Observations: rest breaks needed secondary to fatigue and deconditioning.    Medbridge Access Code [email or web address]    No treadmills per pt request    Pt states that his R hip is not giving him  much problems. Just having L low back pain and R foot symptoms.     Manual therapy Seated STM R leg to decrease fascial restrictions  Improved R leg sensation. R foot feels more sensitive  Pt can perform at home so long as he does not have a DVT. Pt verbalized understanding.    Therapeutic exercise    Standing B ankle DF/PF 2 min with B UE assist   Slight improved R foot sensation  Seated B ankle DF/PF 10x3  Seated knee extension   R 10x. L low back discomfort. L LE symptoms during exercise which eases with rest.    Gait around gym 200 ft.   200 ft max gait distance secondary to L low back pain and fatigue.   Seated transversus abdominis contration 10x5 seconds  Reviewed progress with PT towards goals.    Improved exercise technique, movement at target joints, use of target muscles after mod verbal, visual, tactile cues.      Response to treatment  decreased R leg numnness with STM to decrease fascial restrictions.    Pt response/clinical impression:   Pt demonstrates improved R hip strength, gait speed, as well as functional strength since initial evaluation. Negative ultrasound for DVT R LE so manual therapy to decrease fascial restrictions as well as treatment to promote ankle movement performed today which seem to help improve sensation. Pt demonstrates overall good progress with PT with R hip surgery rehab. Skilled physical therapy services discharged with pt continuing progress with his exercises at home.      PT Short Term Goals - 10/15/20 1718      PT SHORT TERM GOAL #1   Title Patient will be independent with his initial home exercise program to improve strength, function, and ability to ambulate longer distances.    Time 3    Period Weeks    Status Achieved    Target Date 08/06/20             PT Long Term Goals - 10/15/20 1426      PT LONG TERM GOAL #1   Title Patient will improve R hip strength by at least 1/2 MMT grade to promote ability to  ambulate and perform standing tasks with less difficulty.    Time 8    Period Weeks    Status Achieved    Target Date 10/15/20      PT LONG TERM GOAL #2   Title Pt will improve his 10 meter walk speed to at  least 1 meter/second without AD to promote community ambulation.    Baseline 0.87 m/s average, no AD (07/16/2020).  42m/s (08/24/2020)    Time 8    Period Weeks    Status Achieved    Target Date 10/15/20      PT LONG TERM GOAL #3   Title Pt will improve his 5 times sit <> stand time to 11 seconds or less as a demonstration of improved functional strength.    Baseline 13 seconds (07/16/2020); 10 seconds (08/24/2020)    Time 8    Period Weeks    Status Achieved    Target Date 10/15/20      PT LONG TERM GOAL #4   Title Pt will improve his FOTO score by at least 10 points as a demonstration of improved function.    Baseline 46 (07/16/2020); 47 (09/08/2020), (10/15/2020)    Time 5    Period Weeks    Status On-going    Target Date 10/15/20                 Plan - 10/15/20 1421    Clinical Impression Statement Pt demonstrates improved R hip strength, gait speed, as well as functional strength since initial evaluation. Negative ultrasound for DVT R LE so manual therapy to decrease fascial restrictions as well as treatment to promote ankle movement performed today which seem to help improve sensation. Pt demonstrates overall good progress with PT with R hip surgery rehab. Skilled physical therapy services discharged with pt continuing progress with his exercises at home.    Personal Factors and Comorbidities Comorbidity 3+;Fitness;Past/Current Experience    Comorbidities DM, HTN, PVD, CAD    Examination-Activity Limitations Sleep;Squat;Locomotion Level    Stability/Clinical Decision Making Stable/Uncomplicated    Rehab Potential Fair    PT Frequency --    PT Duration --   5 weeks   PT Treatment/Interventions Therapeutic activities;Therapeutic exercise;Balance training;Neuromuscular  re-education;Patient/family education;Manual techniques;Gait training    PT Next Visit Plan Continue progress with his exercises at home.    PT Home Exercise Plan Medbridge Access Code [email or web address]    Consulted and Agree with Plan of Care Patient           Patient will benefit from skilled therapeutic intervention in order to improve the following deficits and impairments:  Abnormal gait,Pain,Postural dysfunction,Improper body mechanics,Difficulty walking,Decreased strength,Decreased endurance  Visit Diagnosis: Difficulty in walking, not elsewhere classified  Muscle weakness (generalized)  Pain in right hip     Problem List Patient Active Problem List   Diagnosis Date Noted   NSTEMI (non-ST elevated myocardial infarction) (HCC) 01/28/2019   Thank you for your referral.  Loralyn Freshwater PT, DPT   10/15/2020, 5:24 PM  [facility] [address], [place], [address] Phone: [phone]   Fax:  [phone]  Name: Damon English MRN: [id number] Date of Birth: 08-Nov-1950

## 2020-11-24 DIAGNOSIS — I25118 Atherosclerotic heart disease of native coronary artery with other forms of angina pectoris: Secondary | ICD-10-CM | POA: Diagnosis not present

## 2020-11-24 DIAGNOSIS — I5022 Chronic systolic (congestive) heart failure: Secondary | ICD-10-CM | POA: Diagnosis not present

## 2020-11-24 DIAGNOSIS — Z86711 Personal history of pulmonary embolism: Secondary | ICD-10-CM | POA: Diagnosis not present

## 2020-11-24 DIAGNOSIS — K219 Gastro-esophageal reflux disease without esophagitis: Secondary | ICD-10-CM | POA: Diagnosis not present

## 2020-11-26 DIAGNOSIS — H401131 Primary open-angle glaucoma, bilateral, mild stage: Secondary | ICD-10-CM | POA: Diagnosis not present

## 2020-12-02 DIAGNOSIS — I2510 Atherosclerotic heart disease of native coronary artery without angina pectoris: Secondary | ICD-10-CM | POA: Diagnosis not present

## 2020-12-11 DIAGNOSIS — E119 Type 2 diabetes mellitus without complications: Secondary | ICD-10-CM | POA: Diagnosis not present

## 2020-12-11 DIAGNOSIS — M545 Low back pain, unspecified: Secondary | ICD-10-CM | POA: Diagnosis not present

## 2020-12-11 DIAGNOSIS — M792 Neuralgia and neuritis, unspecified: Secondary | ICD-10-CM | POA: Diagnosis not present

## 2020-12-11 DIAGNOSIS — I10 Essential (primary) hypertension: Secondary | ICD-10-CM | POA: Diagnosis not present

## 2021-01-04 ENCOUNTER — Other Ambulatory Visit (INDEPENDENT_AMBULATORY_CARE_PROVIDER_SITE_OTHER): Payer: Self-pay | Admitting: Vascular Surgery

## 2021-01-04 ENCOUNTER — Telehealth (INDEPENDENT_AMBULATORY_CARE_PROVIDER_SITE_OTHER): Payer: Self-pay | Admitting: Vascular Surgery

## 2021-01-04 DIAGNOSIS — Z6832 Body mass index (BMI) 32.0-32.9, adult: Secondary | ICD-10-CM | POA: Diagnosis not present

## 2021-01-04 DIAGNOSIS — M79606 Pain in leg, unspecified: Secondary | ICD-10-CM

## 2021-01-04 DIAGNOSIS — I739 Peripheral vascular disease, unspecified: Secondary | ICD-10-CM | POA: Diagnosis not present

## 2021-01-04 DIAGNOSIS — L03119 Cellulitis of unspecified part of limb: Secondary | ICD-10-CM | POA: Diagnosis not present

## 2021-01-04 DIAGNOSIS — L819 Disorder of pigmentation, unspecified: Secondary | ICD-10-CM

## 2021-01-04 DIAGNOSIS — L02619 Cutaneous abscess of unspecified foot: Secondary | ICD-10-CM | POA: Diagnosis not present

## 2021-01-05 ENCOUNTER — Other Ambulatory Visit (INDEPENDENT_AMBULATORY_CARE_PROVIDER_SITE_OTHER): Payer: Self-pay | Admitting: Vascular Surgery

## 2021-01-05 ENCOUNTER — Other Ambulatory Visit: Payer: Self-pay

## 2021-01-05 ENCOUNTER — Encounter (INDEPENDENT_AMBULATORY_CARE_PROVIDER_SITE_OTHER): Payer: Self-pay | Admitting: Vascular Surgery

## 2021-01-05 ENCOUNTER — Ambulatory Visit (INDEPENDENT_AMBULATORY_CARE_PROVIDER_SITE_OTHER): Payer: Medicare Other | Admitting: Vascular Surgery

## 2021-01-05 ENCOUNTER — Ambulatory Visit (INDEPENDENT_AMBULATORY_CARE_PROVIDER_SITE_OTHER): Payer: BC Managed Care – PPO

## 2021-01-05 VITALS — BP 149/76 | HR 65 | Resp 16 | Wt 201.6 lb

## 2021-01-05 DIAGNOSIS — Z951 Presence of aortocoronary bypass graft: Secondary | ICD-10-CM

## 2021-01-05 DIAGNOSIS — E785 Hyperlipidemia, unspecified: Secondary | ICD-10-CM | POA: Diagnosis not present

## 2021-01-05 DIAGNOSIS — I70229 Atherosclerosis of native arteries of extremities with rest pain, unspecified extremity: Secondary | ICD-10-CM | POA: Insufficient documentation

## 2021-01-05 DIAGNOSIS — L819 Disorder of pigmentation, unspecified: Secondary | ICD-10-CM | POA: Diagnosis not present

## 2021-01-05 DIAGNOSIS — E119 Type 2 diabetes mellitus without complications: Secondary | ICD-10-CM | POA: Diagnosis not present

## 2021-01-05 DIAGNOSIS — I10 Essential (primary) hypertension: Secondary | ICD-10-CM | POA: Diagnosis not present

## 2021-01-05 DIAGNOSIS — M79606 Pain in leg, unspecified: Secondary | ICD-10-CM

## 2021-01-05 DIAGNOSIS — I739 Peripheral vascular disease, unspecified: Secondary | ICD-10-CM

## 2021-01-05 DIAGNOSIS — I70222 Atherosclerosis of native arteries of extremities with rest pain, left leg: Secondary | ICD-10-CM | POA: Diagnosis not present

## 2021-01-05 NOTE — H&P (View-Only) (Signed)
Patient ID: Damon English, male   DOB: 19-Feb-1951, 70 y.o.   MRN: [id number]  Chief Complaint  Patient presents with  . New Patient (Initial Visit)    Ref Tobie Lords poor healing wound,h/o rle BPG    HPI Damon English is a 70 y.o. male.  I am asked to see the patient by N. Tobie Lords for evaluation of PAD.  The patient has had discoloration and now some signs of infection of the left foot.  He was getting acupuncture in the areas of acupuncture down around the foot and ankle have developed red streaks with down to the toe and up the leg.  He has had a lot of pain in the left foot and lower leg.  Laying flat at night seems to make this worse.  He reports no clear inciting event or causative factor that started the symptoms.  No fevers or chills.  The patient has a known history of significant peripheral arterial disease and apparently underwent a right leg bypass at [facility] a couple of years ago.  He has also had to unprovoked pulmonary emboli as well as significant heart disease.  He smoked for many years but quit about 8 or 9 years ago.  He is currently on Eliquis long-term after his pulmonary embolus.  He has chronic right leg swelling. He apparently had significant wound complications on the right leg and had a sartorius flap and a VAC on the right groin for some time.  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  This has since been resected over the past couple of years. Noninvasive studies were performed today showing a right ABI of 1.08 and a left ABI of 0.52 with a markedly reduced left digital pressure of 28.   Past Medical History:  Diagnosis Date  . Alcohol use    "cutting back" but still heavy and daily, multiple shots of bourbon each night  . Diabetes mellitus without complication ([place])   . History of tobacco use    reportedly quit in 2013  . Hypertension   . Pulmonary embolism ([place]) 2012   unprovoked  . PVD (peripheral  vascular disease) ([place])     Past Surgical History:  Procedure Laterality Date  . BACK SURGERY    . CHOLECYSTECTOMY    . CORONARY ANGIOGRAPHY N/A 01/30/2019   Procedure: CORONARY ANGIOGRAPHY;  Surgeon: Corey Skains, MD;  Location: [place] [facility];  Service: Cardiovascular;  Laterality: N/A;  . ERCP W/ SPHICTEROTOMY  2015  . FEMORAL ARTERY - POPLITEAL ARTERY BYPASS GRAFT Right 06/19/2018  . THROMBOENDARTERECTOMY Right 06/19/2018  . TONSILLECTOMY    . VASCULAR SURGERY       Family History No bleeding disorders, clotting disorders, autoimmune diseases, or aneurysms   Social History   Tobacco Use  . Smoking status: Former Smoker    Packs/day: 2.00    Years: 40.00    Pack years: 80.00    Quit date: 11/27/2010    Years since quitting: 10.1  . Smokeless tobacco: Never Used  . Tobacco comment: Quit in 2012  Substance Use Topics  . Alcohol use: Yes    Alcohol/week: 7.0 standard drinks    Types: 7 Shots of liquor per week  . Drug use: Never    Allergies  Allergen Reactions  . Amoxicillin Other (See Comments)    Abdominal cramps Other reaction(s): Other (See Comments) Abdominal cramps Tolerated ancef 7/16  Current Outpatient Medications  Medication Sig Dispense Refill  . apixaban (ELIQUIS) 5 MG TABS tablet Take by mouth.    Marland Kitchen atorvastatin (LIPITOR) 80 MG tablet Take 80 mg by mouth daily.    . Blood Glucose Monitoring Suppl (ONE TOUCH ULTRA 2) w/Device KIT See admin instructions.    . brimonidine (ALPHAGAN) 0.2 % ophthalmic solution     . Calcipotriene-Betameth Diprop (ENSTILAR) 0.005-0.064 % FOAM APPLY THIN LAYER TO AFFECTED AREAS ONCE DAILY UNTIL FLAT AND ASYMPTOMATIC, MAX 4 WEEKS    . cephALEXin (KEFLEX) 500 MG capsule Take 500 mg by mouth 3 (three) times daily.    . clonazePAM (KLONOPIN) 0.5 MG tablet Take 0.25-0.5 mg by mouth 3 (three) times daily as needed for anxiety.     . clopidogrel (PLAVIX) 75 MG tablet Take 75 mg by mouth daily.    . famotidine  (PEPCID) 40 MG tablet Take 1 tablet by mouth every morning.    . furosemide (LASIX) 20 MG tablet Take 40 mg by mouth daily.    . isosorbide mononitrate (IMDUR) 30 MG 24 hr tablet Take 30 mg by mouth daily.    Marland Kitchen ketoconazole (NIZORAL) 2 % shampoo Apply topically.    . Latanoprost 0.005 % EMUL Apply to eye.    . metoprolol succinate (TOPROL-XL) 50 MG 24 hr tablet Take 50 mg by mouth 2 (two) times a day. Take with or immediately following a meal.    . midodrine (PROAMATINE) 2.5 MG tablet Take 2.5 mg by mouth daily.    . nitroGLYCERIN (NITROSTAT) 0.4 MG SL tablet Place 0.4 mg under the tongue every 5 (five) minutes as needed for chest pain.    Marland Kitchen omeprazole (PRILOSEC) 40 MG capsule Take 40 mg by mouth daily.    Glory Rosebush ULTRA test strip 2 (two) times daily.    . ranolazine (RANEXA) 500 MG 12 hr tablet Take 500 mg by mouth 2 (two) times daily.    . DULoxetine (CYMBALTA) 60 MG capsule Take 1 capsule (60 mg total) by mouth 2 (two) times daily. (Patient not taking: No sig reported) 20 capsule 0  . esomeprazole (NEXIUM) 40 MG capsule Take 1 capsule by mouth daily. (Patient not taking: Reported on 01/05/2021)    . HYDROcodone-acetaminophen (NORCO/VICODIN) 5-325 MG tablet Take 0.5-1 tablets by mouth 2 (two) times daily as needed for pain. (Patient not taking: Reported on 01/05/2021)    . Magnesium 250 MG TABS Take by mouth. (Patient not taking: Reported on 01/05/2021)    . ramipril (ALTACE) 2.5 MG capsule Take by mouth.    . SENNOSIDES PO Take by mouth. (Patient not taking: No sig reported)    . spironolactone (ALDACTONE) 25 MG tablet Take 25 mg by mouth.    . sucralfate (CARAFATE) 1 g tablet Take 1 tablet by mouth 4 (four) times daily -  before meals and at bedtime. (Patient not taking: No sig reported)     No current facility-administered medications for this visit.      REVIEW OF SYSTEMS (Negative unless checked)  Constitutional: '[]' Weight loss  '[]' Fever  '[]' Chills Cardiac: '[]' Chest pain   '[]' Chest  pressure   '[]' Palpitations   '[]' Shortness of breath when laying flat   '[]' Shortness of breath at rest   '[]' Shortness of breath with exertion. Vascular:  '[x]' Pain in legs with walking   '[]' Pain in legs at rest   '[x]' Pain in legs when laying flat   '[]' Claudication   '[]' Pain in feet when walking  '[]' Pain in feet at rest  '[]' Pain  in feet when laying flat   '[]' History of DVT   '[]' Phlebitis   '[x]' Swelling in legs   '[]' Varicose veins   '[]' Non-healing ulcers Pulmonary:   '[]' Uses home oxygen   '[]' Productive cough   '[]' Hemoptysis   '[]' Wheeze  '[]' COPD   '[]' Asthma Neurologic:  '[]' Dizziness  '[]' Blackouts   '[]' Seizures   '[]' History of stroke   '[]' History of TIA  '[]' Aphasia   '[]' Temporary blindness   '[]' Dysphagia   '[]' Weakness or numbness in arms   '[]' Weakness or numbness in legs Musculoskeletal:  '[x]' Arthritis   '[]' Joint swelling   '[x]' Joint pain   '[]' Low back pain Hematologic:  '[]' Easy bruising  '[]' Easy bleeding   '[]' Hypercoagulable state   '[]' Anemic  '[]' Hepatitis Gastrointestinal:  '[]' Blood in stool   '[]' Vomiting blood  '[]' Gastroesophageal reflux/heartburn   '[]' Abdominal pain Genitourinary:  '[]' Chronic kidney disease   '[]' Difficult urination  '[]' Frequent urination  '[]' Burning with urination   '[]' Hematuria Skin:  '[]' Rashes   '[]' Ulcers   '[]' Wounds Psychological:  '[]' History of anxiety   '[]'  History of major depression.    Physical Exam BP (!) 149/76 (BP Location: Right Arm)   Pulse 65   Resp 16   Wt 201 lb 9.6 oz (91.4 kg)   BMI 31.76 kg/m  Gen:  WD/WN, NAD Head: [place]/AT, No temporalis wasting.  Ear/Nose/Throat: Hearing grossly intact, nares w/o erythema or drainage, oropharynx w/o Erythema/Exudate Eyes: Conjunctiva clear, sclera non-icteric  Neck: trachea midline.  No JVD.  Pulmonary:  Good air movement, respirations not labored, no use of accessory muscles  Cardiac: RRR, no JVD Vascular:  Vessel Right Left  Radial Palpable Palpable                          PT trace NP  DP 2+ trace   Gastrointestinal:. No masses, surgical incisions, or  scars. Musculoskeletal: M/S 5/5 throughout. Left foot is cyanotic with sluggish capillary refill. No deformity or atrophy. 1-2+ RLE edema, trace LLE edema. Neurologic: Sensation grossly intact in extremities.  Symmetrical.  Speech is fluent. Motor exam as listed above. Psychiatric: Judgment intact, Mood & affect appropriate for pt's clinical situation. Dermatologic: No rashes or ulcers noted.  No cellulitis or open wounds.    Radiology No results found.  Labs No results found for this or any previous visit (from the past 2160 hour(s)).  Assessment/Plan:  HTN (hypertension) blood pressure control important in reducing the progression of atherosclerotic disease. On appropriate oral medications.   Non-insulin dependent type 2 diabetes mellitus (HCC) blood glucose control important in reducing the progression of atherosclerotic disease. Also, involved in wound healing. On appropriate medications.   Hyperlipidemia lipid control important in reducing the progression of atherosclerotic disease. Continue statin therapy   Atherosclerosis of native arteries of extremity with rest pain (HCC) ABIs today are 1.08 on the right and 0.52 on the left.  Digit pressure on the right is normal with a markedly reduced digital pressure of only 28 on the left.  Duplex suggested distal left SFA occlusion and likely some tibial disease.  His right leg bypass is patent although he continues to have swelling and pain in that leg which is likely neuropathic in nature.  His left leg is chronically ischemic and his symptoms are concerning for rest pain although I think there is a neuropathic component there as well.  We had a long discussion today about the pathophysiology and natural history of peripheral arterial disease.  We discussed the reason and rationale for treatment.  I would recommend  an angiogram with possible revascularization at that time.  I discussed that some disease is more amenable to surgical  bypass.  He will not have any superficial vein available for bypass as he is already had his left saphenous vein harvest for his right leg bypass in his right saphenous vein apparently was used for coronary bypass but was poor.  Risks and benefits of the procedure were discussed and the patient and his wife are agreeable to proceed.      [name] 01/05/2021, 1:54 PM   This note was created with Dragon medical transcription system.  Any errors from dictation are unintentional.

## 2021-01-05 NOTE — Progress Notes (Signed)
Patient ID: Damon English, male   DOB: September 07, 1951, 70 y.o.   MRN: [id number]  Chief Complaint  Patient presents with  . New Patient (Initial Visit)    Ref Damon English poor healing wound,h/o rle BPG    HPI Damon English is a 70 y.o. male.  I am asked to see the patient by N. Damon English for evaluation of PAD.  The patient has had discoloration and now some signs of infection of the left foot.  He was getting acupuncture in the areas of acupuncture down around the foot and ankle have developed red streaks with down to the toe and up the leg.  He has had a lot of pain in the left foot and lower leg.  Laying flat at night seems to make this worse.  He reports no clear inciting event or causative factor that started the symptoms.  No fevers or chills.  The patient has a known history of significant peripheral arterial disease and apparently underwent a right leg bypass at [facility] a couple of years ago.  He has also had to unprovoked pulmonary emboli as well as significant heart disease.  He smoked for many years but quit about 8 or 9 years ago.  He is currently on Eliquis long-term after his pulmonary embolus.  He has chronic right leg swelling. He apparently had significant wound complications on the right leg and had a sartorius flap and a VAC on the right groin for some time.  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  This has since been resected over the past couple of years. Noninvasive studies were performed today showing a right ABI of 1.08 and a left ABI of 0.52 with a markedly reduced left digital pressure of 28.   Past Medical History:  Diagnosis Date  . Alcohol use    "cutting back" but still heavy and daily, multiple shots of bourbon each night  . Diabetes mellitus without complication ([place])   . History of tobacco use    reportedly quit in 2013  . Hypertension   . Pulmonary embolism ([place]) 2012   unprovoked  . PVD (peripheral  vascular disease) ([place])     Past Surgical History:  Procedure Laterality Date  . BACK SURGERY    . CHOLECYSTECTOMY    . CORONARY ANGIOGRAPHY N/A 01/30/2019   Procedure: CORONARY ANGIOGRAPHY;  Surgeon: Corey Skains, MD;  Location: [facility];  Service: Cardiovascular;  Laterality: N/A;  . ERCP W/ SPHICTEROTOMY  2015  . FEMORAL ARTERY - POPLITEAL ARTERY BYPASS GRAFT Right 06/19/2018  . THROMBOENDARTERECTOMY Right 06/19/2018  . TONSILLECTOMY    . VASCULAR SURGERY       Family History No bleeding disorders, clotting disorders, autoimmune diseases, or aneurysms   Social History   Tobacco Use  . Smoking status: Former Smoker    Packs/day: 2.00    Years: 40.00    Pack years: 80.00    Quit date: 11/27/2010    Years since quitting: 10.1  . Smokeless tobacco: Never Used  . Tobacco comment: Quit in 2012  Substance Use Topics  . Alcohol use: Yes    Alcohol/week: 7.0 standard drinks    Types: 7 Shots of liquor per week  . Drug use: Never    Allergies  Allergen Reactions  . Amoxicillin Other (See Comments)    Abdominal cramps Other reaction(s): Other (See Comments) Abdominal cramps Tolerated ancef 7/16  Current Outpatient Medications  Medication Sig Dispense Refill  . apixaban (ELIQUIS) 5 MG TABS tablet Take by mouth.    Marland Kitchen atorvastatin (LIPITOR) 80 MG tablet Take 80 mg by mouth daily.    . Blood Glucose Monitoring Suppl (ONE TOUCH ULTRA 2) w/Device KIT See admin instructions.    . brimonidine (ALPHAGAN) 0.2 % ophthalmic solution     . Calcipotriene-Betameth Diprop (ENSTILAR) 0.005-0.064 % FOAM APPLY THIN LAYER TO AFFECTED AREAS ONCE DAILY UNTIL FLAT AND ASYMPTOMATIC, MAX 4 WEEKS    . cephALEXin (KEFLEX) 500 MG capsule Take 500 mg by mouth 3 (three) times daily.    . clonazePAM (KLONOPIN) 0.5 MG tablet Take 0.25-0.5 mg by mouth 3 (three) times daily as needed for anxiety.     . clopidogrel (PLAVIX) 75 MG tablet Take 75 mg by mouth daily.    . famotidine  (PEPCID) 40 MG tablet Take 1 tablet by mouth every morning.    . furosemide (LASIX) 20 MG tablet Take 40 mg by mouth daily.    . isosorbide mononitrate (IMDUR) 30 MG 24 hr tablet Take 30 mg by mouth daily.    Marland Kitchen ketoconazole (NIZORAL) 2 % shampoo Apply topically.    . Latanoprost 0.005 % EMUL Apply to eye.    . metoprolol succinate (TOPROL-XL) 50 MG 24 hr tablet Take 50 mg by mouth 2 (two) times a day. Take with or immediately following a meal.    . midodrine (PROAMATINE) 2.5 MG tablet Take 2.5 mg by mouth daily.    . nitroGLYCERIN (NITROSTAT) 0.4 MG SL tablet Place 0.4 mg under the tongue every 5 (five) minutes as needed for chest pain.    Marland Kitchen omeprazole (PRILOSEC) 40 MG capsule Take 40 mg by mouth daily.    Glory Rosebush ULTRA test strip 2 (two) times daily.    . ranolazine (RANEXA) 500 MG 12 hr tablet Take 500 mg by mouth 2 (two) times daily.    . DULoxetine (CYMBALTA) 60 MG capsule Take 1 capsule (60 mg total) by mouth 2 (two) times daily. (Patient not taking: No sig reported) 20 capsule 0  . esomeprazole (NEXIUM) 40 MG capsule Take 1 capsule by mouth daily. (Patient not taking: Reported on 01/05/2021)    . HYDROcodone-acetaminophen (NORCO/VICODIN) 5-325 MG tablet Take 0.5-1 tablets by mouth 2 (two) times daily as needed for pain. (Patient not taking: Reported on 01/05/2021)    . Magnesium 250 MG TABS Take by mouth. (Patient not taking: Reported on 01/05/2021)    . ramipril (ALTACE) 2.5 MG capsule Take by mouth.    . SENNOSIDES PO Take by mouth. (Patient not taking: No sig reported)    . spironolactone (ALDACTONE) 25 MG tablet Take 25 mg by mouth.    . sucralfate (CARAFATE) 1 g tablet Take 1 tablet by mouth 4 (four) times daily -  before meals and at bedtime. (Patient not taking: No sig reported)     No current facility-administered medications for this visit.      REVIEW OF SYSTEMS (Negative unless checked)  Constitutional: '[]' Weight loss  '[]' Fever  '[]' Chills Cardiac: '[]' Chest pain   '[]' Chest  pressure   '[]' Palpitations   '[]' Shortness of breath when laying flat   '[]' Shortness of breath at rest   '[]' Shortness of breath with exertion. Vascular:  '[x]' Pain in legs with walking   '[]' Pain in legs at rest   '[x]' Pain in legs when laying flat   '[]' Claudication   '[]' Pain in feet when walking  '[]' Pain in feet at rest  '[]' Pain  in feet when laying flat   '[]' History of DVT   '[]' Phlebitis   '[x]' Swelling in legs   '[]' Varicose veins   '[]' Non-healing ulcers Pulmonary:   '[]' Uses home oxygen   '[]' Productive cough   '[]' Hemoptysis   '[]' Wheeze  '[]' COPD   '[]' Asthma Neurologic:  '[]' Dizziness  '[]' Blackouts   '[]' Seizures   '[]' History of stroke   '[]' History of TIA  '[]' Aphasia   '[]' Temporary blindness   '[]' Dysphagia   '[]' Weakness or numbness in arms   '[]' Weakness or numbness in legs Musculoskeletal:  '[x]' Arthritis   '[]' Joint swelling   '[x]' Joint pain   '[]' Low back pain Hematologic:  '[]' Easy bruising  '[]' Easy bleeding   '[]' Hypercoagulable state   '[]' Anemic  '[]' Hepatitis Gastrointestinal:  '[]' Blood in stool   '[]' Vomiting blood  '[]' Gastroesophageal reflux/heartburn   '[]' Abdominal pain Genitourinary:  '[]' Chronic kidney disease   '[]' Difficult urination  '[]' Frequent urination  '[]' Burning with urination   '[]' Hematuria Skin:  '[]' Rashes   '[]' Ulcers   '[]' Wounds Psychological:  '[]' History of anxiety   '[]'  History of major depression.    Physical Exam BP (!) 149/76 (BP Location: Right Arm)   Pulse 65   Resp 16   Wt 201 lb 9.6 oz (91.4 kg)   BMI 31.76 kg/m  Gen:  WD/WN, NAD Head: [place]/AT, No temporalis wasting.  Ear/Nose/Throat: Hearing grossly intact, nares w/o erythema or drainage, oropharynx w/o Erythema/Exudate Eyes: Conjunctiva clear, sclera non-icteric  Neck: trachea midline.  No JVD.  Pulmonary:  Good air movement, respirations not labored, no use of accessory muscles  Cardiac: RRR, no JVD Vascular:  Vessel Right Left  Radial Palpable Palpable                          PT trace NP  DP 2+ trace   Gastrointestinal:. No masses, surgical incisions, or  scars. Musculoskeletal: M/S 5/5 throughout. Left foot is cyanotic with sluggish capillary refill. No deformity or atrophy. 1-2+ RLE edema, trace LLE edema. Neurologic: Sensation grossly intact in extremities.  Symmetrical.  Speech is fluent. Motor exam as listed above. Psychiatric: Judgment intact, Mood & affect appropriate for pt's clinical situation. Dermatologic: No rashes or ulcers noted.  No cellulitis or open wounds.    Radiology No results found.  Labs No results found for this or any previous visit (from the past 2160 hour(s)).  Assessment/Plan:  HTN (hypertension) blood pressure control important in reducing the progression of atherosclerotic disease. On appropriate oral medications.   Non-insulin dependent type 2 diabetes mellitus (HCC) blood glucose control important in reducing the progression of atherosclerotic disease. Also, involved in wound healing. On appropriate medications.   Hyperlipidemia lipid control important in reducing the progression of atherosclerotic disease. Continue statin therapy   Atherosclerosis of native arteries of extremity with rest pain (HCC) ABIs today are 1.08 on the right and 0.52 on the left.  Digit pressure on the right is normal with a markedly reduced digital pressure of only 28 on the left.  Duplex suggested distal left SFA occlusion and likely some tibial disease.  His right leg bypass is patent although he continues to have swelling and pain in that leg which is likely neuropathic in nature.  His left leg is chronically ischemic and his symptoms are concerning for rest pain although I think there is a neuropathic component there as well.  We had a long discussion today about the pathophysiology and natural history of peripheral arterial disease.  We discussed the reason and rationale for treatment.  I would recommend  an angiogram with possible revascularization at that time.  I discussed that some disease is more amenable to surgical  bypass.  He will not have any superficial vein available for bypass as he is already had his left saphenous vein harvest for his right leg bypass in his right saphenous vein apparently was used for coronary bypass but was poor.  Risks and benefits of the procedure were discussed and the patient and his wife are agreeable to proceed.      [name] 01/05/2021, 1:54 PM   This note was created with Dragon medical transcription system.  Any errors from dictation are unintentional.

## 2021-01-05 NOTE — Assessment & Plan Note (Signed)
lipid control important in reducing the progression of atherosclerotic disease. Continue statin therapy  

## 2021-01-05 NOTE — Patient Instructions (Signed)
Peripheral Vascular Disease  Peripheral vascular disease (PVD) is a disease of the blood vessels. PVD may also be called peripheral artery disease (PAD) or poor circulation. PVD is the blocking or hardening of the arteries anywhere within the circulatory system beyond the heart. This can result in a decreased supply of blood to the arms, legs, and internal organs, such as the stomach or kidneys. However, PVD most often affects a person's lower legs and feet. Without treatment, PVD often worsens. PVD can lead to acute limb ischemia. This occurs when an arm or leg suddenly has trouble getting enough blood. This is a medical emergency. What are the causes? The most common cause of PVD is atherosclerosis. This is a buildup of fatty material and other substances (plaque)inside your arteries. Pieces of plaque can break off from the walls of an artery and become stuck in a smaller artery, blocking blood flow and possibly causing acute limb ischemia. Other common causes of PVD include:  Blood clots that form inside the blood vessels.  Injuries to blood vessels.  Diseases that cause inflammation of blood vessels or cause blood vessel tightening (spasms). What increases the risk? The following factors may make you more likely to develop this condition:  A family history of PVD.  Common medical conditions, including: ? High cholesterol. ? Diabetes. ? High blood pressure (hypertension). ? Heart disease. ? Known atherosclerotic disease in another area of the body. ? Past injury, such as burns or a broken bone.  Other medical conditions, such as: ? Buerger's disease. This is caused by inflamed blood vessels in your hands and feet. ? Some forms of arthritis. ? Birth defects that affect the arteries in your legs. ? Kidney disease.  Using tobacco and nicotine products.  Not getting enough exercise.  Obesity.  Being age 68 or older, or being age 46 or older and having the other risk  factors. What are the signs or symptoms? This condition may cause different symptoms. Your symptoms depend on what body part is not getting enough blood. Common signs and symptoms include:  Cramps in your buttocks, legs, and feet.  Intermittent claudication. This is pain and weakness in your legs during activity that resolves with rest.  Leg pain at rest and leg numbness, tingling, or weakness.  Coldness in a leg or foot, especially when compared to the other leg or foot.  Skin or hair changes. These can include: ? Hair loss. ? Shiny skin. ? Pale or bluish skin. ? Thick toenails.  Inability to get or maintain an erection (erectile dysfunction).  Tiredness (fatigue).  Weak pulse or no pulse in the feet. People with PVD are more likely to develop open wounds (ulcers) and sores on their toes, feet, or legs. The ulcers or sores may take longer than normal to heal. How is this diagnosed? PVD is diagnosed based on your signs and symptoms, a physical exam, and your medical history. You may also have other tests to find the cause. Tests include:  Ankle-brachial index test.This test compares the blood pressure readings of the legs and arms. ? This may also include an exercise ankle-brachial index test in which you walk on a treadmill to check your symptoms.  Doppler ultrasound. This takes pictures of blood flow through your blood vessels.  Imaging studies that use dye to show blood flow. These are: ? CT angiogram. ? Magnetic resonance angiogram, or MRA. How is this treated? Treatment for PVD depends on the cause of your condition, how severe your symptoms  are, and your age. Underlying causes need to be treated and controlled. These include long-term (chronic) conditions, such as diabetes, high cholesterol, and hypertension. Treatment may include:  Lifestyle changes, such as: ? Quitting tobacco use. ? Exercising regularly. ? Following a low-fat, low-cholesterol diet. ? Not drinking  alcohol.  Taking medicines, such as: ? Blood thinners to prevent blood clots. ? Medicines to improve blood flow. ? Medicines to improve cholesterol levels.  Procedures, such as: ? Angioplasty. This uses an inflated balloon to open a blocked artery and improve blood flow. ? Stent implant. This inserts a small mesh tube to keep a blocked artery open. ? Peripheral bypass surgery. This reroutes blood flow around a blocked artery. ? Surgery to remove dead tissue from an infected wound (debridement). ? Amputation. This is surgical removal of the affected limb. It may be necessary in cases of acute limb ischemia when medical or surgical treatments have not helped. Follow these instructions at home: Medicines  Take over-the-counter and prescription medicines only as told by your health care provider.  If you are taking blood thinners: ? Talk with your health care provider before you take any medicines that contain aspirin or NSAIDs, such as ibuprofen. These medicines increase your risk for dangerous bleeding. ? Take your medicine exactly as told, at the same time every day. ? Avoid activities that could cause injury or bruising, and follow instructions about how to prevent falls. ? Wear a medical alert bracelet or carry a card that lists what medicines you take. Lifestyle  Exercise regularly. Ask your health care provider about some good activities for you.  Talk with your health care provider about maintaining a healthy weight. If needed, ask about losing weight.  Eat a diet that is low in fat and cholesterol. If you need help, talk with your health care provider.  Do not drink alcohol.  Do not use any products that contain nicotine or tobacco. These products include cigarettes, chewing tobacco, and vaping devices, such as e-cigarettes. If you need help quitting, ask your health care provider.      General instructions  Take good care of your feet. To do this: ? Wear comfortable shoes  that fit well. ? Check your feet often for any cuts or sores.  Get an annual influenza vaccine.  Keep all follow-up visits. This is important. Where to find more information  Society for Vascular Surgery: vascular.org  American Heart Association: heart.org  National Heart, Lung, and Blood Institute: https://www.[place].biz/ Contact a health care provider if:  You have leg cramps while walking.  You have leg pain when you rest.  Your leg or foot feels cold.  Your skin changes color.  You have erectile dysfunction.  You have cuts or sores on your legs or feet that do not heal. Get help right away if:  You have sudden changes in color and feeling of your arms or legs, such as: ? Your arm or leg turns cold, numb, and blue. ? Your arm or leg becomes red, warm, swollen, painful, or numb.  You have any symptoms of a stroke. "BE FAST" is an easy way to remember the main warning signs of a stroke: ? B - Balance. Signs are dizziness, sudden trouble walking, or loss of balance. ? E - Eyes. Signs are trouble seeing or a sudden change in vision. ? F - Face. Signs are sudden weakness or numbness of the face, or the face or eyelid drooping on one side. ? A - Arms. Signs  are weakness or numbness in an arm. This happens suddenly and usually on one side of the body. ? S - Speech. Signs are sudden trouble speaking, slurred speech, or trouble understanding what people say. ? T - Time. Time to call emergency services. Write down what time symptoms started.  You have other signs of a stroke, such as: ? A sudden, severe headache with no known cause. ? Nausea or vomiting. ? Seizure.  You have chest pain or trouble breathing. These symptoms may represent a serious problem that is an emergency. Do not wait to see if the symptoms will go away. Get medical help right away. Call your local emergency services (911 in the [place] Do not drive yourself to the hospital. Summary  Peripheral vascular disease (PVD)  is a disease of the blood vessels.  PVD is the blocking or hardening of the arteries anywhere within the circulatory system beyond the heart.  PVD may cause different symptoms. Your symptoms depend on what part of your body is not getting enough blood.  Treatment for PVD depends on what caused it, how severe your symptoms are, and your age. This information is not intended to replace advice given to you by your health care provider. Make sure you discuss any questions you have with your health care provider. Document Revised: 04/06/2020 Document Reviewed: 04/06/2020 Elsevier Patient Education  [place].

## 2021-01-05 NOTE — Assessment & Plan Note (Signed)
ABIs today are 1.08 on the right and 0.52 on the left.  Digit pressure on the right is normal with a markedly reduced digital pressure of only 28 on the left.  Duplex suggested distal left SFA occlusion and likely some tibial disease.  His right leg bypass is patent although he continues to have swelling and pain in that leg which is likely neuropathic in nature.  His left leg is chronically ischemic and his symptoms are concerning for rest pain although I think there is a neuropathic component there as well.  We had a long discussion today about the pathophysiology and natural history of peripheral arterial disease.  We discussed the reason and rationale for treatment.  I would recommend an angiogram with possible revascularization at that time.  I discussed that some disease is more amenable to surgical bypass.  He will not have any superficial vein available for bypass as he is already had his left saphenous vein harvest for his right leg bypass in his right saphenous vein apparently was used for coronary bypass but was poor.  Risks and benefits of the procedure were discussed and the patient and his wife are agreeable to proceed.

## 2021-01-05 NOTE — Assessment & Plan Note (Signed)
blood glucose control important in reducing the progression of atherosclerotic disease. Also, involved in wound healing. On appropriate medications.  

## 2021-01-05 NOTE — Assessment & Plan Note (Signed)
blood pressure control important in reducing the progression of atherosclerotic disease. On appropriate oral medications.  

## 2021-01-06 NOTE — Telephone Encounter (Signed)
Spoke with the patient and he is scheduled with Dr. Lucky Cowboy for a left leg angio on 01/14/21 with a 8:15 am arrival time to the [facility]. Covid testing on 01/12/21 between 8-2 pm at the [facility]. Pre-procedure instuction were discussed and will be mailed.

## 2021-01-07 DIAGNOSIS — H401131 Primary open-angle glaucoma, bilateral, mild stage: Secondary | ICD-10-CM | POA: Insufficient documentation

## 2021-01-12 ENCOUNTER — Other Ambulatory Visit: Payer: Self-pay

## 2021-01-12 ENCOUNTER — Other Ambulatory Visit
Admission: RE | Admit: 2021-01-12 | Discharge: 2021-01-12 | Disposition: A | Discharge status: Home / Self Care | Payer: BC Managed Care – PPO | Source: Ambulatory Visit | Attending: Vascular Surgery | Admitting: Vascular Surgery

## 2021-01-12 ENCOUNTER — Telehealth: Payer: Self-pay | Admitting: *Deleted

## 2021-01-12 DIAGNOSIS — Z20822 Contact with and (suspected) exposure to covid-19: Secondary | ICD-10-CM | POA: Diagnosis not present

## 2021-01-12 DIAGNOSIS — Z01812 Encounter for preprocedural laboratory examination: Secondary | ICD-10-CM | POA: Insufficient documentation

## 2021-01-12 NOTE — Telephone Encounter (Signed)
Pt's wife Hassan Rowan called regarding his pre-op covid test; the pt was told to park and walk to bldg; his wife says he is unable to walk; contacted Caryl Pina at testing site and she says there are wheel chairs and staff to assist at the site; unable to hear pt's wife; attempted to contact her and left information on voicemail.

## 2021-01-13 ENCOUNTER — Other Ambulatory Visit (INDEPENDENT_AMBULATORY_CARE_PROVIDER_SITE_OTHER): Payer: Self-pay | Admitting: Nurse Practitioner

## 2021-01-13 LAB — SARS CORONAVIRUS 2 (TAT 6-24 HRS): SARS Coronavirus 2: NEGATIVE

## 2021-01-14 ENCOUNTER — Telehealth (INDEPENDENT_AMBULATORY_CARE_PROVIDER_SITE_OTHER): Payer: Self-pay | Admitting: Vascular Surgery

## 2021-01-14 ENCOUNTER — Ambulatory Visit
Admission: RE | Admit: 2021-01-14 | Discharge: 2021-01-14 | Disposition: A | Discharge status: Home / Self Care | Payer: BC Managed Care – PPO | Attending: Vascular Surgery | Admitting: Vascular Surgery

## 2021-01-14 ENCOUNTER — Encounter: Payer: Self-pay | Admitting: Vascular Surgery

## 2021-01-14 ENCOUNTER — Encounter
Admission: RE | Disposition: A | Discharge status: Home / Self Care | Payer: Self-pay | Source: Home / Self Care | Attending: Vascular Surgery

## 2021-01-14 DIAGNOSIS — E1151 Type 2 diabetes mellitus with diabetic peripheral angiopathy without gangrene: Secondary | ICD-10-CM | POA: Insufficient documentation

## 2021-01-14 DIAGNOSIS — I708 Atherosclerosis of other arteries: Secondary | ICD-10-CM | POA: Insufficient documentation

## 2021-01-14 DIAGNOSIS — E785 Hyperlipidemia, unspecified: Secondary | ICD-10-CM | POA: Diagnosis not present

## 2021-01-14 DIAGNOSIS — Z881 Allergy status to other antibiotic agents status: Secondary | ICD-10-CM | POA: Insufficient documentation

## 2021-01-14 DIAGNOSIS — Z79899 Other long term (current) drug therapy: Secondary | ICD-10-CM | POA: Insufficient documentation

## 2021-01-14 DIAGNOSIS — Z7902 Long term (current) use of antithrombotics/antiplatelets: Secondary | ICD-10-CM | POA: Diagnosis not present

## 2021-01-14 DIAGNOSIS — Z87891 Personal history of nicotine dependence: Secondary | ICD-10-CM | POA: Diagnosis not present

## 2021-01-14 DIAGNOSIS — I70221 Atherosclerosis of native arteries of extremities with rest pain, right leg: Secondary | ICD-10-CM | POA: Diagnosis not present

## 2021-01-14 DIAGNOSIS — I10 Essential (primary) hypertension: Secondary | ICD-10-CM | POA: Diagnosis not present

## 2021-01-14 DIAGNOSIS — I70223 Atherosclerosis of native arteries of extremities with rest pain, bilateral legs: Secondary | ICD-10-CM | POA: Diagnosis not present

## 2021-01-14 DIAGNOSIS — Z7901 Long term (current) use of anticoagulants: Secondary | ICD-10-CM | POA: Insufficient documentation

## 2021-01-14 DIAGNOSIS — I70222 Atherosclerosis of native arteries of extremities with rest pain, left leg: Secondary | ICD-10-CM | POA: Insufficient documentation

## 2021-01-14 DIAGNOSIS — I70229 Atherosclerosis of native arteries of extremities with rest pain, unspecified extremity: Secondary | ICD-10-CM

## 2021-01-14 HISTORY — PX: LOWER EXTREMITY ANGIOGRAPHY: CATH118251

## 2021-01-14 LAB — GLUCOSE, CAPILLARY
Glucose-Capillary: 99 mg/dL (ref 70–99)
Glucose-Capillary: 136 mg/dL — ABNORMAL HIGH (ref 70–99)

## 2021-01-14 LAB — BUN: BUN: 20 mg/dL (ref 8–23)

## 2021-01-14 LAB — CREATININE, SERUM
Creatinine, Ser: 1.12 mg/dL (ref 0.61–1.24)
GFR, Estimated: >60 mL/min (ref >60)

## 2021-01-14 SURGERY — LOWER EXTREMITY ANGIOGRAPHY
Anesthesia: Moderate Sedation | Site: Leg Lower | Laterality: Left

## 2021-01-14 MED ORDER — HYDRALAZINE HCL 20 MG/ML IJ SOLN: 20.0000 mg | Freq: Once | INTRAMUSCULAR | Status: AC

## 2021-01-14 MED ORDER — CEFAZOLIN SODIUM-DEXTROSE 2-4 GM/100ML-% IV SOLN: 2.0000 g | Freq: Once | INTRAVENOUS | Status: DC

## 2021-01-14 MED ORDER — ONDANSETRON HCL 4 MG/2ML IJ SOLN
4.0000 mg | Freq: Four times a day (QID) | INTRAMUSCULAR | Status: DC | PRN
  Administered 2021-01-14: 4 mg via INTRAVENOUS

## 2021-01-14 MED ORDER — MIDAZOLAM HCL 2 MG/2ML IJ SOLN
INTRAMUSCULAR | Status: DC | PRN
  Administered 2021-01-14: 1 mg via INTRAVENOUS
  Administered 2021-01-14 (×2): 2 mg via INTRAVENOUS

## 2021-01-14 MED ORDER — METHYLPREDNISOLONE SODIUM SUCC 125 MG IJ SOLR: 125.0000 mg | Freq: Once | INTRAMUSCULAR | Status: DC | PRN

## 2021-01-14 MED ORDER — SODIUM CHLORIDE 0.9 % IV SOLN: INTRAVENOUS | Status: DC

## 2021-01-14 MED ORDER — ONDANSETRON HCL 4 MG/2ML IJ SOLN: 4.0000 mg | Freq: Four times a day (QID) | INTRAMUSCULAR | Status: DC | PRN

## 2021-01-14 MED ORDER — IODIXANOL 320 MG/ML IV SOLN
INTRAVENOUS | Status: DC | PRN
  Administered 2021-01-14: 75 mL

## 2021-01-14 MED ORDER — SODIUM CHLORIDE 0.9% FLUSH: 3.0000 mL | Freq: Two times a day (BID) | INTRAVENOUS | Status: DC

## 2021-01-14 MED ORDER — HYDROMORPHONE HCL 1 MG/ML IJ SOLN
INTRAMUSCULAR | Status: AC
  Filled 2021-01-14: qty 0.5

## 2021-01-14 MED ORDER — FENTANYL CITRATE (PF) 100 MCG/2ML IJ SOLN
INTRAMUSCULAR | Status: DC | PRN
  Administered 2021-01-14 (×2): 50 ug via INTRAVENOUS

## 2021-01-14 MED ORDER — LABETALOL HCL 5 MG/ML IV SOLN
INTRAVENOUS | Status: DC | PRN
  Administered 2021-01-14: 10 mg via INTRAVENOUS

## 2021-01-14 MED ORDER — HYDRALAZINE HCL 20 MG/ML IJ SOLN
INTRAMUSCULAR | Status: AC
  Administered 2021-01-14: 20 mg via INTRAVENOUS
  Filled 2021-01-14: qty 1

## 2021-01-14 MED ORDER — HYDRALAZINE HCL 20 MG/ML IJ SOLN: 5.0000 mg | INTRAMUSCULAR | Status: DC | PRN

## 2021-01-14 MED ORDER — HYDROMORPHONE HCL 1 MG/ML IJ SOLN
1.0000 mg | Freq: Once | INTRAMUSCULAR | Status: AC | PRN
Start: 2021-01-14 — End: 2021-01-14
  Administered 2021-01-14: 0.5 mg via INTRAVENOUS

## 2021-01-14 MED ORDER — ACETAMINOPHEN 325 MG PO TABS: 650.0000 mg | ORAL_TABLET | ORAL | Status: DC | PRN

## 2021-01-14 MED ORDER — MIDAZOLAM HCL 2 MG/ML PO SYRP: 8.0000 mg | ORAL_SOLUTION | Freq: Once | ORAL | Status: DC | PRN

## 2021-01-14 MED ORDER — HEPARIN SODIUM (PORCINE) 1000 UNIT/ML IJ SOLN
INTRAMUSCULAR | Status: DC | PRN
  Administered 2021-01-14: 5000 [IU] via INTRAVENOUS

## 2021-01-14 MED ORDER — MIDAZOLAM HCL 5 MG/5ML IJ SOLN
INTRAMUSCULAR | Status: AC
  Filled 2021-01-14: qty 5

## 2021-01-14 MED ORDER — CEFAZOLIN SODIUM-DEXTROSE 2-4 GM/100ML-% IV SOLN
INTRAVENOUS | Status: AC
  Administered 2021-01-14: 2 g via INTRAVENOUS
  Filled 2021-01-14: qty 100

## 2021-01-14 MED ORDER — SODIUM CHLORIDE 0.9 % IV SOLN: 250.0000 mL | INTRAVENOUS | Status: DC | PRN

## 2021-01-14 MED ORDER — SODIUM CHLORIDE 0.9% FLUSH: 3.0000 mL | INTRAVENOUS | Status: DC | PRN

## 2021-01-14 MED ORDER — FENTANYL CITRATE (PF) 100 MCG/2ML IJ SOLN
INTRAMUSCULAR | Status: AC
  Filled 2021-01-14: qty 2

## 2021-01-14 MED ORDER — FAMOTIDINE 20 MG PO TABS: 40.0000 mg | ORAL_TABLET | Freq: Once | ORAL | Status: DC | PRN

## 2021-01-14 MED ORDER — DIPHENHYDRAMINE HCL 50 MG/ML IJ SOLN: 50.0000 mg | Freq: Once | INTRAMUSCULAR | Status: DC | PRN

## 2021-01-14 MED ORDER — ONDANSETRON HCL 4 MG/2ML IJ SOLN
INTRAMUSCULAR | Status: AC
  Filled 2021-01-14: qty 2

## 2021-01-14 MED ORDER — LABETALOL HCL 5 MG/ML IV SOLN: 10.0000 mg | INTRAVENOUS | Status: DC | PRN

## 2021-01-14 MED ORDER — HEPARIN SODIUM (PORCINE) 1000 UNIT/ML IJ SOLN
INTRAMUSCULAR | Status: AC
  Filled 2021-01-14: qty 1

## 2021-01-14 MED ORDER — LABETALOL HCL 5 MG/ML IV SOLN
INTRAVENOUS | Status: AC
  Filled 2021-01-14: qty 4

## 2021-01-14 SURGICAL SUPPLY — 26 items
BALLN DORADO 8X60X80 (BALLOONS) ×2
BALLN LUTONIX AV 8X60X75 (BALLOONS) ×2
BALLN ULTRVRSE 4X60X75 (BALLOONS) ×2
BALLOON DORADO 8X60X80 (BALLOONS) ×1 IMPLANT
BALLOON LUTONIX AV 8X60X75 (BALLOONS) ×1 IMPLANT
BALLOON ULTRVRSE 4X60X75 (BALLOONS) ×1 IMPLANT
CATH ANGIO 5F PIGTAIL 65CM (CATHETERS) ×2 IMPLANT
CATH BEACON 5 .035 40 KMP TP (CATHETERS) ×1 IMPLANT
CATH BEACON 5 .038 40 KMP TP (CATHETERS) ×1
COVER PROBE U/S 5X48 (MISCELLANEOUS) ×2 IMPLANT
DEVICE STARCLOSE SE CLOSURE (Vascular Products) ×4 IMPLANT
GLIDEWIRE ADV .035X180CM (WIRE) ×4 IMPLANT
INTRODUCER 7FR 23CM (INTRODUCER) ×4 IMPLANT
KIT ENCORE 26 ADVANTAGE (KITS) ×4 IMPLANT
KIT MICROPUNCTURE NIT STIFF (SHEATH) ×2 IMPLANT
PACK ANGIOGRAPHY (CUSTOM PROCEDURE TRAY) ×2 IMPLANT
SHEATH BRITE TIP 5FRX11 (SHEATH) ×2 IMPLANT
STENT LIFESTAR 9X80 (Permanent Stent) ×2 IMPLANT
STENT LIFESTREAM 8X26X80 (Permanent Stent) ×2 IMPLANT
STENT LIFESTREAM 8X58X80 (Permanent Stent) ×2 IMPLANT
STENT VIABAHN 8X100X120 (Permanent Stent) ×1 IMPLANT
STENT VIABAHN 8X10X120 (Permanent Stent) ×1 IMPLANT
SYR MEDRAD MARK 7 150ML (SYRINGE) ×2 IMPLANT
TUBING CONTRAST HIGH PRESS 72 (TUBING) ×2 IMPLANT
WIRE G 018X200 V18 (WIRE) ×2 IMPLANT
WIRE GUIDERIGHT .035X150 (WIRE) ×2 IMPLANT

## 2021-01-14 NOTE — Telephone Encounter (Signed)
Called [facility] per to set up appointment for patient.  Left Voicemail of date and time found in 4 week range.

## 2021-01-14 NOTE — Progress Notes (Signed)
Dr. Lucky Cowboy at bedside, speaking with pt. & spouse re: procedural results. Both verbalize understanding of conversation

## 2021-01-14 NOTE — Progress Notes (Signed)
Pt. Received zofran earlier for vomiting post dilaudid IV. All N/V gone now. Pt. Denies any cardiac or subjective c/o. Assisted OOB, to BR, then back to room. No complications to either groin. Stable for DC home.

## 2021-01-14 NOTE — Op Note (Signed)
[facility]  Percutaneous Study/Intervention Procedural Note   Date of Surgery: 01/14/2021  Surgeon(s):Lilyannah Zuelke    Assistants:none  Pre-operative Diagnosis: PAD with rest pain LLE, s/p bypass on the right leg  Post-operative diagnosis:  Same  Procedure(s) Performed:             1.  Ultrasound guidance for vascular access bilateral femoral arteries             2.  Catheter placement into aorta from bilateral femoral approaches             3.  Aortogram and bilateral iliofemoral angiograms             4.   Kissing balloon expandable stent placements to bilateral common iliac arteries with 8 mm diameter by 26 mm length lifestream stent on the right and 8 mm diameter by 58 mm length lifestream stent on the left             5.   Additional stent placement in the right external iliac artery with 9 mm diameter by 8 cm length life star stent  6.  Additional stent placement in the left external iliac artery with 8 mm diameter by 10 cm length Viabahn stent             7.  StarClose closure device bilateral femoral artery  EBL: 25 cc  Contrast: 75 cc  Fluoro Time: 9.1 minutes  Moderate Conscious Sedation Time: approximately 56 minutes using 5 mg of Versed and 100 mcg of Fentanyl              Indications:  Patient is a 70 y.o.male with severe pain in the left leg as well as pain in the right leg after a bypass on the right lower extremity last year with known significant peripheral arterial disease.  The patient is brought in for angiography for further evaluation and potential treatment.  Due to the limb threatening nature of the situation, angiogram was performed for attempted limb salvage. The patient is aware that if the procedure fails, amputation would be expected.  The patient also understands that even with successful revascularization, amputation may still be required due to the severity of the situation.  Risks and benefits are discussed and informed consent is  obtained.   Procedure:  The patient was identified and appropriate procedural time out was performed.  The patient was then placed supine on the table and prepped and draped in the usual sterile fashion. Moderate conscious sedation was administered during a face to face encounter with the patient throughout the procedure with my supervision of the RN administering medicines and monitoring the patient's vital signs, pulse oximetry, telemetry and mental status throughout from the start of the procedure until the patient was taken to the recovery room. Ultrasound was used to evaluate the right common femoral artery.  It was patent .  A digital ultrasound image was acquired.  A micropuncture needle was used to access the right common femoral artery under direct ultrasound guidance and a permanent image was performed.  A micropuncture wire and sheath were then placed.  A 0.035 J wire was advanced without resistance and a 5Fr sheath was placed.  Pigtail catheter was placed into the aorta and an AP aortogram was performed.  It was then pulled down to the aortic bifurcation where pelvic obliques were used to see the iliac and femoral arteries.  This demonstrated patent renal arteries bilaterally.  The aorta was itself patent,  but both common iliac arteries had significant disease in the proximal segments.  The right common iliac lesion was in the 60 to 70% range and was highly calcific.  The left was about 95% stenotic and highly calcific.  There were also 2 areas of greater than 60% stenosis in the right external iliac artery with the more severe one being in the proximal external iliac artery and the more moderate on being in the distal right external iliac artery.  The left hypogastric artery was occluded.  The left external iliac artery had another 90% stenosis in the proximal segment and then about a 60% stenosis in the distal segment.  The left common femoral artery also had a roughly 50% stenosis.  The right common  femoral artery was dilated after his previous surgery and not stenotic.  It was felt that it was in the patient's best interest to proceed with intervention after these images to avoid a second procedure and a larger amount of contrast and fluoroscopy based off of the findings from the initial angiogram. The patient was systemically heparinized and a 7 French 23 cm sheath was then placed over the Terumo Advantage wire on the right.  I then accessed the left femoral artery under direct ultrasound guidance without difficulty with a puncture needle.  A micropuncture wire and sheath were then placed.  The left femoral sheath was then upsized to a 5 [place] sheath over an advantage wire.  Using an advantage wire and a Kumpe catheter, I was able to cross the severe lesions in the external and common iliac arteries on the left and get up into the aorta.  I then upsized to a 7 [place] sheath on the left as well.  The left common iliac lesion needed to be predilated and a 4 mm diameter angioplasty dilate this lesion.  I then performed kissing balloon expandable stent placements to both common iliac arteries just a few millimeters up into the aorta.  The right stent was 8 mm in diameter by 26 mm in length and the left stent was 8 mm in diameter by 58 mm in length.  These were deployed simultaneously to 12 atm.  To address the external iliac lesions on the right, a 9 mm diameter by 8 cm length life star stent was deployed from the bottom of the lifestream stent down to the mid to distal right external iliac artery.  This was postdilated with an 8 mm diameter Lutonix drug-coated balloon and less than 10% residual stenosis was seen in the right iliac system.  On the left side, there remained significant disease in the left external iliac artery. I then used a Kumpe catheter and the advantage wire to then exchanged for a 0.018 wire.  As left hypogastric artery was already occluded, I elected to place a Viabahn stent bridging from  the lifestream stent in the distal left common iliac artery down to the distal left external iliac artery just above the femoral head.  This was an 8 mm diameter by 10 cm length Viabahn stent placed in the left external iliac artery.  This is postdilated with an 8 mm balloon with excellent angiographic completion result and less than 10% residual stenosis in the left iliac system. I elected to terminate the procedure. The sheath was removed and StarClose closure device was deployed in the left femoral artery with excellent hemostatic result.  Similarly, StarClose closure device was deployed in the right femoral artery with excellent hemostatic result.  The patient was  taken to the recovery room in stable condition having tolerated the procedure well.  Findings:               Aortogram:  This demonstrated patent renal arteries bilaterally.  The aorta was itself patent, but both common iliac arteries had significant disease in the proximal segments.  The right common iliac lesion was in the 60 to 70% range and was highly calcific.  The left was about 95% stenotic and highly calcific.  There were also 2 areas of greater than 60% stenosis in the right external iliac artery with the more severe one being in the proximal external iliac artery and the more moderate on being in the distal right external iliac artery.  The left hypogastric artery was occluded.  The left external iliac artery had another 90% stenosis in the proximal segment and then about a 60% stenosis in the distal segment.  The left common femoral artery also had a roughly 50% stenosis.  The right common femoral artery was dilated after his previous surgery and not stenotic.               Disposition: Patient was taken to the recovery room in stable condition having tolerated the procedure well.  Complications: None  [name] 01/14/2021 10:45 AM   This note was created with Dragon Medical transcription system. Any errors in dictation are  purely unintentional.

## 2021-01-14 NOTE — Interval H&P Note (Signed)
History and Physical Interval Note:  01/14/2021 8:49 AM  Damon English  has presented today for surgery, with the diagnosis of LLE Angiography   ASO with rest pain   Pt to have Covid test on 3-29 BARD Rep   cc: [name].  The various methods of treatment have been discussed with the patient and family. After consideration of risks, benefits and other options for treatment, the patient has consented to  Procedure(s): LOWER EXTREMITY ANGIOGRAPHY (Left) as a surgical intervention.  The patient's history has been reviewed, patient examined, no change in status, stable for surgery.  I have reviewed the patient's chart and labs.  Questions were answered to the patient's satisfaction.     [name]

## 2021-01-19 ENCOUNTER — Encounter (INDEPENDENT_AMBULATORY_CARE_PROVIDER_SITE_OTHER): Payer: Self-pay

## 2021-01-19 ENCOUNTER — Encounter (INDEPENDENT_AMBULATORY_CARE_PROVIDER_SITE_OTHER): Payer: Self-pay | Admitting: Vascular Surgery

## 2021-02-03 DIAGNOSIS — H401132 Primary open-angle glaucoma, bilateral, moderate stage: Secondary | ICD-10-CM | POA: Diagnosis not present

## 2021-02-10 ENCOUNTER — Other Ambulatory Visit (INDEPENDENT_AMBULATORY_CARE_PROVIDER_SITE_OTHER): Payer: Self-pay | Admitting: Vascular Surgery

## 2021-02-10 DIAGNOSIS — Z86711 Personal history of pulmonary embolism: Secondary | ICD-10-CM | POA: Diagnosis not present

## 2021-02-10 DIAGNOSIS — I6930 Unspecified sequelae of cerebral infarction: Secondary | ICD-10-CM | POA: Diagnosis not present

## 2021-02-10 DIAGNOSIS — Z951 Presence of aortocoronary bypass graft: Secondary | ICD-10-CM | POA: Diagnosis not present

## 2021-02-10 DIAGNOSIS — Z86718 Personal history of other venous thrombosis and embolism: Secondary | ICD-10-CM | POA: Diagnosis not present

## 2021-02-10 DIAGNOSIS — E785 Hyperlipidemia, unspecified: Secondary | ICD-10-CM | POA: Diagnosis not present

## 2021-02-10 DIAGNOSIS — I2510 Atherosclerotic heart disease of native coronary artery without angina pectoris: Secondary | ICD-10-CM | POA: Diagnosis not present

## 2021-02-10 DIAGNOSIS — Z7901 Long term (current) use of anticoagulants: Secondary | ICD-10-CM | POA: Diagnosis not present

## 2021-02-10 DIAGNOSIS — Z95820 Peripheral vascular angioplasty status with implants and grafts: Secondary | ICD-10-CM

## 2021-02-10 DIAGNOSIS — I110 Hypertensive heart disease with heart failure: Secondary | ICD-10-CM | POA: Diagnosis not present

## 2021-02-10 DIAGNOSIS — E538 Deficiency of other specified B group vitamins: Secondary | ICD-10-CM | POA: Diagnosis not present

## 2021-02-10 DIAGNOSIS — I739 Peripheral vascular disease, unspecified: Secondary | ICD-10-CM | POA: Diagnosis not present

## 2021-02-10 DIAGNOSIS — E119 Type 2 diabetes mellitus without complications: Secondary | ICD-10-CM | POA: Diagnosis not present

## 2021-02-10 DIAGNOSIS — E7211 Homocystinuria: Secondary | ICD-10-CM | POA: Diagnosis not present

## 2021-02-10 DIAGNOSIS — I5022 Chronic systolic (congestive) heart failure: Secondary | ICD-10-CM | POA: Diagnosis not present

## 2021-02-10 DIAGNOSIS — Z87891 Personal history of nicotine dependence: Secondary | ICD-10-CM | POA: Diagnosis not present

## 2021-02-10 DIAGNOSIS — I70222 Atherosclerosis of native arteries of extremities with rest pain, left leg: Secondary | ICD-10-CM

## 2021-02-12 ENCOUNTER — Ambulatory Visit (INDEPENDENT_AMBULATORY_CARE_PROVIDER_SITE_OTHER): Payer: BC Managed Care – PPO

## 2021-02-12 ENCOUNTER — Encounter (INDEPENDENT_AMBULATORY_CARE_PROVIDER_SITE_OTHER): Payer: Self-pay | Admitting: Vascular Surgery

## 2021-02-12 ENCOUNTER — Other Ambulatory Visit: Payer: Self-pay

## 2021-02-12 ENCOUNTER — Ambulatory Visit (INDEPENDENT_AMBULATORY_CARE_PROVIDER_SITE_OTHER): Payer: BC Managed Care – PPO | Admitting: Vascular Surgery

## 2021-02-12 VITALS — BP 164/79 | HR 94 | Resp 16 | Wt 209.2 lb

## 2021-02-12 DIAGNOSIS — E119 Type 2 diabetes mellitus without complications: Secondary | ICD-10-CM

## 2021-02-12 DIAGNOSIS — Z95820 Peripheral vascular angioplasty status with implants and grafts: Secondary | ICD-10-CM | POA: Diagnosis not present

## 2021-02-12 DIAGNOSIS — E785 Hyperlipidemia, unspecified: Secondary | ICD-10-CM | POA: Diagnosis not present

## 2021-02-12 DIAGNOSIS — I10 Essential (primary) hypertension: Secondary | ICD-10-CM | POA: Diagnosis not present

## 2021-02-12 DIAGNOSIS — I70222 Atherosclerosis of native arteries of extremities with rest pain, left leg: Secondary | ICD-10-CM | POA: Diagnosis not present

## 2021-02-12 NOTE — Progress Notes (Signed)
MRN : [id number]  Damon English is a 70 y.o. (1950-11-01) male who presents with chief complaint of  Chief Complaint  Patient presents with  . Follow-up    4wk post le angio  .  History of Present Illness: Patient returns today in follow up of his PAD.  Few weeks ago, he underwent extensive aortoiliac intervention for severe disease above a bypass on the right and with critically low ABI on the left.  He did well from the procedure.  He still has some residual bruising and has not entirely resolved.  His ABIs today are 1.03 with triphasic waveforms on the right and 0.62 on the left with decent monophasic waveforms.  He did not have infrainguinal intervention on the left leg due to the extensive aortoiliac disease.  Clinically, he is doing much better.  He still limited by his right leg chronic swelling and pain.  He has previously had a bypass done at another institution.  He had no periprocedural complications.  He does feel his legs are improved and he can walk better.  Current Outpatient Medications  Medication Sig Dispense Refill  . apixaban (ELIQUIS) 5 MG TABS tablet Take by mouth.    Marland Kitchen atorvastatin (LIPITOR) 80 MG tablet Take 80 mg by mouth daily.    . Blood Glucose Monitoring Suppl (ONE TOUCH ULTRA 2) w/Device KIT See admin instructions.    . brimonidine (ALPHAGAN) 0.2 % ophthalmic solution     . Calcipotriene-Betameth Diprop (ENSTILAR) 0.005-0.064 % FOAM APPLY THIN LAYER TO AFFECTED AREAS ONCE DAILY UNTIL FLAT AND ASYMPTOMATIC, MAX 4 WEEKS    . clopidogrel (PLAVIX) 75 MG tablet Take 75 mg by mouth daily.    . DULoxetine (CYMBALTA) 60 MG capsule Take 1 capsule (60 mg total) by mouth 2 (two) times daily. 20 capsule 0  . esomeprazole (NEXIUM) 40 MG capsule Take 1 capsule by mouth daily.    . famotidine (PEPCID) 40 MG tablet Take 1 tablet by mouth every morning.    . furosemide (LASIX) 20 MG tablet Take 40 mg by mouth daily.    Marland Kitchen glipiZIDE (GLUCOTROL XL) 10 MG 24 hr tablet Take 10 mg  by mouth every morning.    Marland Kitchen HYDROcodone-acetaminophen (NORCO/VICODIN) 5-325 MG tablet Take 0.5-1 tablets by mouth 2 (two) times daily as needed for pain.    . isosorbide mononitrate (IMDUR) 30 MG 24 hr tablet Take 30 mg by mouth daily.    Marland Kitchen ketoconazole (NIZORAL) 2 % shampoo Apply topically.    . Latanoprost 0.005 % EMUL Apply to eye.    . Magnesium 250 MG TABS Take by mouth.    . metoprolol succinate (TOPROL-XL) 50 MG 24 hr tablet Take 50 mg by mouth 2 (two) times a day. Take with or immediately following a meal.    . midodrine (PROAMATINE) 2.5 MG tablet Take 2.5 mg by mouth daily.    . nitroGLYCERIN (NITROSTAT) 0.4 MG SL tablet Place 0.4 mg under the tongue every 5 (five) minutes as needed for chest pain.    Marland Kitchen omeprazole (PRILOSEC) 40 MG capsule Take 40 mg by mouth daily.    Glory Rosebush ULTRA test strip 2 (two) times daily.    . ranolazine (RANEXA) 500 MG 12 hr tablet Take 500 mg by mouth 2 (two) times daily.    . cephALEXin (KEFLEX) 500 MG capsule Take 500 mg by mouth 3 (three) times daily. (Patient not taking: Reported on 02/12/2021)    . clonazePAM (KLONOPIN) 0.5 MG tablet Take  0.25-0.5 mg by mouth 3 (three) times daily as needed for anxiety.  (Patient not taking: Reported on 02/12/2021)    . ramipril (ALTACE) 2.5 MG capsule Take by mouth.    . SENNOSIDES PO Take by mouth. (Patient not taking: No sig reported)    . spironolactone (ALDACTONE) 25 MG tablet Take 25 mg by mouth.    . sucralfate (CARAFATE) 1 g tablet Take 1 tablet by mouth 4 (four) times daily -  before meals and at bedtime. (Patient not taking: No sig reported)     No current facility-administered medications for this visit.    Past Medical History:  Diagnosis Date  . Alcohol use    "cutting back" but still heavy and daily, multiple shots of bourbon each night  . Diabetes mellitus without complication ([place])   . History of tobacco use    reportedly quit in 2013  . Hypertension   . Pulmonary embolism ([place]) 2012    unprovoked  . PVD (peripheral vascular disease) ([place])     Past Surgical History:  Procedure Laterality Date  . BACK SURGERY    . CHOLECYSTECTOMY    . CORONARY ANGIOGRAPHY N/A 01/30/2019   Procedure: CORONARY ANGIOGRAPHY;  Surgeon: Corey Skains, MD;  Location: [facility];  Service: Cardiovascular;  Laterality: N/A;  . ERCP W/ SPHICTEROTOMY  2015  . FEMORAL ARTERY - POPLITEAL ARTERY BYPASS GRAFT Right 06/19/2018  . LOWER EXTREMITY ANGIOGRAPHY Left 01/14/2021   Procedure: LOWER EXTREMITY ANGIOGRAPHY;  Surgeon: Algernon Huxley, MD;  Location: [facility];  Service: Cardiovascular;  Laterality: Left;  . THROMBOENDARTERECTOMY Right 06/19/2018  . TONSILLECTOMY    . VASCULAR SURGERY       Social History   Tobacco Use  . Smoking status: Former Smoker    Packs/day: 2.00    Years: 40.00    Pack years: 80.00    Quit date: 11/27/2010    Years since quitting: 10.2  . Smokeless tobacco: Never Used  . Tobacco comment: Quit in 2012  Substance Use Topics  . Alcohol use: Yes    Alcohol/week: 7.0 standard drinks    Types: 7 Shots of liquor per week  . Drug use: Never       Family History  Problem Relation Age of Onset  . Hypertension Mother   . Parkinson's disease Mother   . Heart attack Father   . Vascular Disease Father   . AAA (abdominal aortic aneurysm) Father   . Alzheimer's disease Father      Allergies  Allergen Reactions  . Amoxicillin Other (See Comments)    Abdominal cramps Other reaction(s): Other (See Comments) Abdominal cramps Tolerated ancef 7/16  . Dilaudid [Hydromorphone Hcl]     Quick & severe nausea/vomiting   . 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  Is on NOAC for now.  No ASA with it. Not allergic per patient.      REVIEW OF SYSTEMS (Negative unless checked)  Constitutional: '[]' ?Weight loss  '[]' ?Fever  '[]' ?Chills Cardiac: '[]' ?Chest pain   '[]' ?Chest pressure   '[]' ?Palpitations   '[]' ?Shortness  of breath when laying flat   '[]' ?Shortness of breath at rest   '[]' ?Shortness of breath with exertion. Vascular:  '[x]' ?Pain in legs with walking   '[]' ?Pain in legs at rest   '[x]' ?Pain in legs when laying flat   '[]' ?Claudication   '[]' ?Pain in feet when walking  '[]' ?Pain in feet at rest  '[]' ?Pain in feet when laying  flat   '[]' ?History of DVT   '[]' ?Phlebitis   '[x]' ?Swelling in legs   '[]' ?Varicose veins   '[]' ?Non-healing ulcers Pulmonary:   '[]' ?Uses home oxygen   '[]' ?Productive cough   '[]' ?Hemoptysis   '[]' ?Wheeze  '[]' ?COPD   '[]' ?Asthma Neurologic:  '[]' ?Dizziness  '[]' ?Blackouts   '[]' ?Seizures   '[]' ?History of stroke   '[]' ?History of TIA  '[]' ?Aphasia   '[]' ?Temporary blindness   '[]' ?Dysphagia   '[]' ?Weakness or numbness in arms   '[]' ?Weakness or numbness in legs Musculoskeletal:  '[x]' ?Arthritis   '[]' ?Joint swelling   '[x]' ?Joint pain   '[]' ?Low back pain Hematologic:  '[]' ?Easy bruising  '[]' ?Easy bleeding   '[]' ?Hypercoagulable state   '[]' ?Anemic  '[]' ?Hepatitis Gastrointestinal:  '[]' ?Blood in stool   '[]' ?Vomiting blood  '[]' ?Gastroesophageal reflux/heartburn   '[]' ?Abdominal pain Genitourinary:  '[]' ?Chronic kidney disease   '[]' ?Difficult urination  '[]' ?Frequent urination  '[]' ?Burning with urination   '[]' ?Hematuria Skin:  '[]' ?Rashes   '[]' ?Ulcers   '[]' ?Wounds Psychological:  '[]' ?History of anxiety   '[]' ? History of major depression.  Physical Examination  BP (!) 164/79 (BP Location: Right Arm)   Pulse 94   Resp 16   Wt 209 lb 3.2 oz (94.9 kg)   BMI 31.81 kg/m  Gen:  WD/WN, NAD Head: [place]/AT, No temporalis wasting. Ear/Nose/Throat: Hearing grossly intact, nares w/o erythema or drainage Eyes: Conjunctiva clear. Sclera non-icteric Neck: Supple.  Trachea midline Pulmonary:  Good air movement, no use of accessory muscles.  Cardiac: RRR, no JVD Vascular:  Vessel Right Left  Radial Palpable Palpable                          PT  trace palpable  1+ palpable  DP  1+ palpable  trace palpable   Gastrointestinal: soft, non-tender/non-distended. No  guarding/reflex.  Musculoskeletal: M/S 5/5 throughout.  No deformity or atrophy.  1-2+ right lower extremity edema, trace left lower extremity edema.  Feet are warm with good capillary refill Neurologic: Sensation grossly intact in extremities.  Symmetrical.  Speech is fluent.  Psychiatric: Judgment intact, Mood & affect appropriate for pt's clinical situation. Dermatologic: No rashes or ulcers noted.  No cellulitis or open wounds.       Labs Recent Results (from the past 2160 hour(s))  SARS CORONAVIRUS 2 (TAT 6-24 HRS) Nasopharyngeal Nasopharyngeal Swab     Status: None   Collection Time: 01/12/21  1:44 PM   Specimen: Nasopharyngeal Swab  Result Value Ref Range   SARS Coronavirus 2 NEGATIVE NEGATIVE    Comment: (NOTE) SARS-CoV-2 target nucleic acids are NOT DETECTED.  The SARS-CoV-2 RNA is generally detectable in upper and lower respiratory specimens during the acute phase of infection. Negative results do not preclude SARS-CoV-2 infection, do not rule out co-infections with other pathogens, and should not be used as the sole basis for treatment or other patient management decisions. Negative results must be combined with clinical observations, patient history, and epidemiological information. The expected result is Negative.  Fact Sheet for Patients: SugarRoll.be  Fact Sheet for Healthcare Providers: https://www.woods-mathews.com/  This test is not yet approved or cleared by the [place] FDA and  has been authorized for detection and/or diagnosis of SARS-CoV-2 by FDA under an Emergency Use Authorization (EUA). This EUA will remain  in effect (meaning this test can be used) for the duration of the COVID-19 declaration under Se ction 564(b)(1) of the Act, 21 U.S.C. section 360bbb-3(b)(1), unless the authorization is terminated or revoked sooner.  Performed at [facility], [place]  [address] [place], [place] [address]    BUN     Status: None   Collection Time: 01/14/21  8:57 AM  Result Value Ref Range   BUN 20 8 - 23 mg/dL    Comment: Performed at [facility], [address] [place], [place] [address]  Creatinine, serum     Status: None   Collection Time: 01/14/21  8:57 AM  Result Value Ref Range   Creatinine, Ser 1.12 0.61 - 1.24 mg/dL   GFR, Estimated >60 >60 mL/min    Comment: (NOTE) Calculated using the CKD-EPI Creatinine Equation (2021) Performed at [facility], [address] [place], [place] [address]   Glucose, capillary     Status: None   Collection Time: 01/14/21  9:05 AM  Result Value Ref Range   Glucose-Capillary 99 70 - 99 mg/dL    Comment: Glucose reference range applies only to samples taken after fasting for at least 8 hours.  Glucose, capillary     Status: Abnormal   Collection Time: 01/14/21 10:53 AM  Result Value Ref Range   Glucose-Capillary 136 (H) 70 - 99 mg/dL    Comment: Glucose reference range applies only to samples taken after fasting for at least 8 hours.    Radiology PERIPHERAL VASCULAR CATHETERIZATION  Result Date: 01/14/2021 See op note   Assessment/Plan HTN (hypertension) blood pressure control important in reducing the progression of atherosclerotic disease. On appropriate oral medications.   Non-insulin dependent type 2 diabetes mellitus (HCC) blood glucose control important in reducing the progression of atherosclerotic disease. Also, involved in wound healing. On appropriate medications.   Hyperlipidemia lipid control important in reducing the progression of atherosclerotic disease. Continue statin therapy  Atherosclerosis of native arteries of extremity with rest pain (HCC) His ABIs today are 1.03 with triphasic waveforms on the right and 0.62 on the left with decent monophasic waveforms.  I would like him to continue his anticoagulants and statin agent.  Although he still does have some claudication symptoms with reduced  flow on the left, these are quite mild that he is better.  He does not have any limb threatening symptoms and I am okay monitoring this with noninvasive studies unless his symptoms progress.  I will plan to see him back in 3 months with ABIs or sooner if problems develop in the interim    [name], MD  02/12/2021 10:26 AM    This note was created with Dragon medical transcription system.  Any errors from dictation are purely unintentional

## 2021-02-12 NOTE — Assessment & Plan Note (Signed)
His ABIs today are 1.03 with triphasic waveforms on the right and 0.62 on the left with decent monophasic waveforms.  I would like him to continue his anticoagulants and statin agent.  Although he still does have some claudication symptoms with reduced flow on the left, these are quite mild that he is better.  He does not have any limb threatening symptoms and I am okay monitoring this with noninvasive studies unless his symptoms progress.  I will plan to see him back in 3 months with ABIs or sooner if problems develop in the interim

## 2021-03-09 DIAGNOSIS — S90122A Contusion of left lesser toe(s) without damage to nail, initial encounter: Secondary | ICD-10-CM | POA: Diagnosis not present

## 2021-03-11 ENCOUNTER — Other Ambulatory Visit (INDEPENDENT_AMBULATORY_CARE_PROVIDER_SITE_OTHER): Payer: Self-pay | Admitting: Nurse Practitioner

## 2021-03-11 DIAGNOSIS — M7989 Other specified soft tissue disorders: Secondary | ICD-10-CM

## 2021-03-12 ENCOUNTER — Ambulatory Visit (INDEPENDENT_AMBULATORY_CARE_PROVIDER_SITE_OTHER): Payer: BC Managed Care – PPO | Admitting: Nurse Practitioner

## 2021-03-12 ENCOUNTER — Encounter (INDEPENDENT_AMBULATORY_CARE_PROVIDER_SITE_OTHER): Payer: Self-pay | Admitting: Nurse Practitioner

## 2021-03-12 ENCOUNTER — Other Ambulatory Visit: Payer: Self-pay

## 2021-03-12 ENCOUNTER — Ambulatory Visit (INDEPENDENT_AMBULATORY_CARE_PROVIDER_SITE_OTHER): Payer: BC Managed Care – PPO

## 2021-03-12 VITALS — BP 154/74 | HR 82 | Resp 16

## 2021-03-12 DIAGNOSIS — I70222 Atherosclerosis of native arteries of extremities with rest pain, left leg: Secondary | ICD-10-CM

## 2021-03-12 DIAGNOSIS — M7989 Other specified soft tissue disorders: Secondary | ICD-10-CM

## 2021-03-12 DIAGNOSIS — R600 Localized edema: Secondary | ICD-10-CM | POA: Diagnosis not present

## 2021-03-12 DIAGNOSIS — E785 Hyperlipidemia, unspecified: Secondary | ICD-10-CM | POA: Diagnosis not present

## 2021-03-12 DIAGNOSIS — R2242 Localized swelling, mass and lump, left lower limb: Secondary | ICD-10-CM

## 2021-03-14 ENCOUNTER — Encounter (INDEPENDENT_AMBULATORY_CARE_PROVIDER_SITE_OTHER): Payer: Self-pay | Admitting: Nurse Practitioner

## 2021-03-18 DIAGNOSIS — Z6833 Body mass index (BMI) 33.0-33.9, adult: Secondary | ICD-10-CM | POA: Diagnosis not present

## 2021-03-18 DIAGNOSIS — Z9181 History of falling: Secondary | ICD-10-CM | POA: Diagnosis not present

## 2021-03-18 DIAGNOSIS — Z1331 Encounter for screening for depression: Secondary | ICD-10-CM | POA: Diagnosis not present

## 2021-03-18 DIAGNOSIS — M79672 Pain in left foot: Secondary | ICD-10-CM | POA: Diagnosis not present

## 2021-03-19 ENCOUNTER — Other Ambulatory Visit: Payer: Self-pay

## 2021-03-19 ENCOUNTER — Ambulatory Visit (INDEPENDENT_AMBULATORY_CARE_PROVIDER_SITE_OTHER): Payer: BC Managed Care – PPO | Admitting: Nurse Practitioner

## 2021-03-19 VITALS — BP 159/74 | HR 79 | Ht 66.0 in | Wt 206.0 lb

## 2021-03-19 DIAGNOSIS — R2242 Localized swelling, mass and lump, left lower limb: Secondary | ICD-10-CM

## 2021-03-19 NOTE — Progress Notes (Signed)
History of Present Illness  There is no documented history at this time  Assessments & Plan   There are no diagnoses linked to this encounter.    Additional instructions  Subjective:  Patient presents with venous ulcer of the Bilateral lower extremity.    Procedure:  3 layer unna wrap was placed Bilateral lower extremity.   Plan:   Follow up in one week.  

## 2021-03-20 DIAGNOSIS — I739 Peripheral vascular disease, unspecified: Secondary | ICD-10-CM | POA: Insufficient documentation

## 2021-03-21 ENCOUNTER — Encounter (INDEPENDENT_AMBULATORY_CARE_PROVIDER_SITE_OTHER): Payer: Self-pay | Admitting: Nurse Practitioner

## 2021-03-21 NOTE — Progress Notes (Signed)
Subjective:    Patient ID: Damon English, male    DOB: Feb 09, 1951, 70 y.o.   MRN: [id number] Chief Complaint  Patient presents with  . Follow-up    Rle swelling    Damon English that presents today for swelling of his right lower extremity.  Initially the patient contacted [place] to let us know that his right lower extremity was having pain and swelling.  The patient recently had extensive intervention on the right lower extremity with reperfusion swelling.  However since that time the swelling has not decreased and he has had multiple small blisters appear.  The patient's wife is also concerned about some recent swelling in his left foot.  The swelling in his left foot happened suddenly.  He is also unable to bear weight on his left foot.  He presented to an urgent care where x-rays were done and they noted that nothing was broken.  He denies any fevers or chills.  Today noninvasive studies show no evidence of DVT seen bilaterally.  There is left great saphenous vein was used years ago for a CABG.  The right great saphenous vein was used for a bypass graft.  Normal arterial flow was also noted in the right lower extremity.  The left lower extremity waveforms are consistent with previous studies done several months ago.   Review of Systems  Cardiovascular: Positive for leg swelling.  Musculoskeletal: Positive for arthralgias and gait problem.  All other systems reviewed and are negative.      Objective:   Physical Exam Vitals reviewed.  HENT:     Head: Normocephalic.  Cardiovascular:     Rate and Rhythm: Normal rate.  Pulmonary:     Effort: Pulmonary effort is normal.  Musculoskeletal:     Right lower leg: 2+ Edema present.     Left lower leg: 1+ Edema present.  Neurological:     Mental Status: He is alert and oriented to person, place, and time.     Motor: Weakness present.     Gait: Gait abnormal.  Psychiatric:        Mood and Affect: Mood normal.        Behavior: Behavior  normal.        Thought Content: Thought content normal.        Judgment: Judgment normal.     BP (!) 154/74 (BP Location: Right Arm)   Pulse 82   Resp 16   Past Medical History:  Diagnosis Date  . Alcohol use    "cutting back" but still heavy and daily, multiple shots of bourbon each night  . Diabetes mellitus without complication ([place])   . History of tobacco use    reportedly quit in 2013  . Hypertension   . Pulmonary embolism ([place]) 2012   unprovoked  . PVD (peripheral vascular disease) ([place])     Social History   Socioeconomic History  . Marital status: Married    Spouse name: Not on file  . Number of children: Not on file  . Years of education: Not on file  . Highest education level: Not on file  Occupational History  . Not on file  Tobacco Use  . Smoking status: Former Smoker    Packs/day: 2.00    Years: 40.00    Pack years: 80.00    Quit date: 11/27/2010    Years since quitting: 10.3  . Smokeless tobacco: Never Used  . Tobacco comment: Quit in 2012  Substance and Sexual Activity  .  Alcohol use: Yes    Alcohol/week: 7.0 standard drinks    Types: 7 Shots of liquor per week  . Drug use: Never  . Sexual activity: Not on file  Other Topics Concern  . Not on file  Social History Narrative  . Not on file   Social Determinants of Health   Financial Resource Strain: Not on file  Food Insecurity: Not on file  Transportation Needs: Not on file  Physical Activity: Not on file  Stress: Not on file  Social Connections: Not on file  Intimate Partner Violence: Not on file    Past Surgical History:  Procedure Laterality Date  . BACK SURGERY    . CHOLECYSTECTOMY    . CORONARY ANGIOGRAPHY N/A 01/30/2019   Procedure: CORONARY ANGIOGRAPHY;  Surgeon: Corey Skains, MD;  Location: [facility];  Service: Cardiovascular;  Laterality: N/A;  . ERCP W/ SPHICTEROTOMY  2015  . FEMORAL ARTERY - POPLITEAL ARTERY BYPASS GRAFT Right 06/19/2018  . LOWER EXTREMITY  ANGIOGRAPHY Left 01/14/2021   Procedure: LOWER EXTREMITY ANGIOGRAPHY;  Surgeon: Algernon Huxley, MD;  Location: [facility];  Service: Cardiovascular;  Laterality: Left;  . THROMBOENDARTERECTOMY Right 06/19/2018  . TONSILLECTOMY    . VASCULAR SURGERY      Family History  Problem Relation Age of Onset  . Hypertension Mother   . Parkinson's disease Mother   . Heart attack Father   . Vascular Disease Father   . AAA (abdominal aortic aneurysm) Father   . Alzheimer's disease Father     Allergies  Allergen Reactions  . Amoxicillin Other (See Comments)    Abdominal cramps Other reaction(s): Other (See Comments) Abdominal cramps Tolerated ancef 7/16  . Dilaudid [Hydromorphone Hcl]     Quick & severe nausea/vomiting   . 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  Is on NOAC for now.  No ASA with it. Not allergic per patient.     CBC Latest Ref Rng & Units 01/30/2019 01/29/2019 01/28/2019  WBC 4.0 - 10.5 K/uL 7.2 8.1 7.8  Hemoglobin 13.0 - 17.0 g/dL 14.0 13.8 14.4  Hematocrit 39.0 - 52.0 % 41.1 40.5 42.2  Platelets 150 - 400 K/uL 133(L) 141(L) 134(L)      CMP     Component Value Date/Time   NA 136 01/30/2019 0652   NA 136 11/08/2013 1530   K 3.7 01/30/2019 0652   K 3.4 (L) 11/08/2013 1530   CL 104 01/30/2019 0652   CL 102 11/08/2013 1530   CO2 24 01/30/2019 0652   CO2 27 11/08/2013 1530   GLUCOSE 140 (H) 01/30/2019 0652   GLUCOSE 150 (H) 11/08/2013 1530   BUN 20 01/14/2021 0857   BUN 16 11/08/2013 1530   CREATININE 1.12 01/14/2021 0857   CREATININE 1.07 11/08/2013 1530   CALCIUM 8.4 (L) 01/30/2019 0652   CALCIUM 9.3 11/08/2013 1530   PROT 7.5 01/28/2019 0331   ALBUMIN 4.1 01/28/2019 0331   AST 46 (H) 01/28/2019 0331   ALT 34 01/28/2019 0331   ALKPHOS 51 01/28/2019 0331   BILITOT 1.1 01/28/2019 0331   GFRNONAA >60 01/14/2021 0857   GFRNONAA >60 11/08/2013 1530   GFRAA >60 01/30/2019 0652   GFRAA >60 11/08/2013 1530      VAS [place] ABI WITH/WO TBI  Result Date: 02/12/2021  LOWER EXTREMITY DOPPLER STUDY Patient Name:  Damon English  Date of Exam:   02/12/2021 Medical Rec #: [id number]  Accession #:    [id number] Date of Birth: 14-Sep-1951          Patient Gender: M Patient Age:   69Y Exam Location:  [facility] Procedure:      VAS [place] ABI WITH/WO TBI Referring Phys: [id number] Erskine Squibb DEW --------------------------------------------------------------------------------   Vascular Interventions: 01/14/2021 Bilateral CIA and EIA stents. Performing Technologist: Charlane Ferretti RT (R)(VS)  Examination Guidelines: A complete evaluation includes at minimum, Doppler waveform signals and systolic blood pressure reading at the level of bilateral brachial, anterior tibial, and posterior tibial arteries, when vessel segments are accessible. Bilateral testing is considered an integral part of a complete examination. Photoelectric Plethysmograph (PPG) waveforms and toe systolic pressure readings are included as required and additional duplex testing as needed. Limited examinations for reoccurring indications may be performed as noted.  ABI Findings: +---------+------------------+-----+---------+--------+ Right    Rt Pressure (mmHg)IndexWaveform Comment  +---------+------------------+-----+---------+--------+ Brachial 167                                      +---------+------------------+-----+---------+--------+ ATA      150               0.90 triphasic         +---------+------------------+-----+---------+--------+ PTA      172               1.03 triphasic         +---------+------------------+-----+---------+--------+ Galvin Proffer               0.74 Normal            +---------+------------------+-----+---------+--------+ +---------+------------------+-----+----------+-------+ Left     Lt Pressure (mmHg)IndexWaveform  Comment +---------+------------------+-----+----------+-------+ Brachial  162                                      +---------+------------------+-----+----------+-------+ ATA      98                0.59 monophasic        +---------+------------------+-----+----------+-------+ PTA      103               0.62 monophasic        +---------+------------------+-----+----------+-------+ Great Toe38                0.23 Abnormal          +---------+------------------+-----+----------+-------+ Summary: Right: Resting right ankle-brachial index is within normal range. No evidence of significant right lower extremity arterial disease. The right toe-brachial index is normal. Left: Resting left ankle-brachial index indicates moderate left lower extremity arterial disease. The left toe-brachial index is abnormal. *See table(s) above for measurements and observations.  Electronically signed by [name] MD on 02/12/2021 at 12:38:17 PM.    Final        Assessment & Plan:    1. Localized swelling of left foot Based on noninvasive studies the sudden swelling is not related to vascular issues.  It is possible that it could be related to gout or other arthritic changes.  Patient is advised to follow-up with PCP for further work-up.  However we will also place the patient in a left wrap to help with swelling of the foot to see if this helps to relieve some of the patient's pain and discomfort.  2. Hyperlipidemia, unspecified hyperlipidemia type Continue statin  as ordered and reviewed, no changes at this time   3. Right leg swelling I suspect that the right lower extremity swelling is related to changes from reperfusion swelling and the swelling has become persistent.  In order to help the swelling we will place the patient in [place] wraps to be changed on a weekly basis in our office.  This will help the patient to be able to transition into compression socks.  Patient is also advised to elevate his lower extremity when possible and to walk as tolerable.  We will reevaluate  the swelling in 4 weeks.   Current Outpatient Medications on File Prior to Visit  Medication Sig Dispense Refill  . apixaban (ELIQUIS) 5 MG TABS tablet Take by mouth.    Marland Kitchen atorvastatin (LIPITOR) 80 MG tablet Take 80 mg by mouth daily.    . Blood Glucose Monitoring Suppl (ONE TOUCH ULTRA 2) w/Device KIT See admin instructions.    . brimonidine (ALPHAGAN) 0.2 % ophthalmic solution     . Calcipotriene-Betameth Diprop (ENSTILAR) 0.005-0.064 % FOAM APPLY THIN LAYER TO AFFECTED AREAS ONCE DAILY UNTIL FLAT AND ASYMPTOMATIC, MAX 4 WEEKS    . clopidogrel (PLAVIX) 75 MG tablet Take 75 mg by mouth daily.    . DULoxetine (CYMBALTA) 60 MG capsule Take 1 capsule (60 mg total) by mouth 2 (two) times daily. 20 capsule 0  . esomeprazole (NEXIUM) 40 MG capsule Take 1 capsule by mouth daily.    . famotidine (PEPCID) 40 MG tablet Take 1 tablet by mouth every morning.    . furosemide (LASIX) 20 MG tablet Take 40 mg by mouth daily.    Marland Kitchen glipiZIDE (GLUCOTROL XL) 10 MG 24 hr tablet Take 10 mg by mouth every morning.    Marland Kitchen HYDROcodone-acetaminophen (NORCO/VICODIN) 5-325 MG tablet Take 0.5-1 tablets by mouth 2 (two) times daily as needed for pain.    . isosorbide mononitrate (IMDUR) 30 MG 24 hr tablet Take 30 mg by mouth daily.    Marland Kitchen ketoconazole (NIZORAL) 2 % shampoo Apply topically.    . Latanoprost 0.005 % EMUL Apply to eye.    . Magnesium 250 MG TABS Take by mouth.    . metoprolol succinate (TOPROL-XL) 50 MG 24 hr tablet Take 50 mg by mouth 2 (two) times a day. Take with or immediately following a meal.    . midodrine (PROAMATINE) 2.5 MG tablet Take 2.5 mg by mouth daily.    . nitroGLYCERIN (NITROSTAT) 0.4 MG SL tablet Place 0.4 mg under the tongue every 5 (five) minutes as needed for chest pain.    Marland Kitchen omeprazole (PRILOSEC) 40 MG capsule Take 40 mg by mouth daily.    Glory Rosebush ULTRA test strip 2 (two) times daily.    . ranolazine (RANEXA) 500 MG 12 hr tablet Take 500 mg by mouth 2 (two) times daily.    .  cephALEXin (KEFLEX) 500 MG capsule Take 500 mg by mouth 3 (three) times daily. (Patient not taking: No sig reported)    . clonazePAM (KLONOPIN) 0.5 MG tablet Take 0.25-0.5 mg by mouth 3 (three) times daily as needed for anxiety.  (Patient not taking: No sig reported)    . ramipril (ALTACE) 2.5 MG capsule Take by mouth.    . SENNOSIDES PO Take by mouth. (Patient not taking: No sig reported)    . spironolactone (ALDACTONE) 25 MG tablet Take 25 mg by mouth.    . sucralfate (CARAFATE) 1 g tablet Take 1 tablet by mouth 4 (four) times daily -  before meals and at bedtime. (Patient not taking: No sig reported)     No current facility-administered medications on file prior to visit.    There are no Patient Instructions on file for this visit. No follow-ups on file.   Kris Hartmann, NP

## 2021-03-23 DIAGNOSIS — I951 Orthostatic hypotension: Secondary | ICD-10-CM | POA: Diagnosis not present

## 2021-03-23 DIAGNOSIS — Z86711 Personal history of pulmonary embolism: Secondary | ICD-10-CM | POA: Diagnosis not present

## 2021-03-23 DIAGNOSIS — I5022 Chronic systolic (congestive) heart failure: Secondary | ICD-10-CM | POA: Diagnosis not present

## 2021-03-23 DIAGNOSIS — I25118 Atherosclerotic heart disease of native coronary artery with other forms of angina pectoris: Secondary | ICD-10-CM | POA: Diagnosis not present

## 2021-03-25 ENCOUNTER — Encounter (INDEPENDENT_AMBULATORY_CARE_PROVIDER_SITE_OTHER): Payer: Medicare Other

## 2021-03-26 ENCOUNTER — Encounter (INDEPENDENT_AMBULATORY_CARE_PROVIDER_SITE_OTHER): Payer: Self-pay | Admitting: Nurse Practitioner

## 2021-03-26 ENCOUNTER — Other Ambulatory Visit: Payer: Self-pay

## 2021-03-26 ENCOUNTER — Ambulatory Visit (INDEPENDENT_AMBULATORY_CARE_PROVIDER_SITE_OTHER): Payer: BC Managed Care – PPO | Admitting: Nurse Practitioner

## 2021-03-26 VITALS — BP 167/78 | HR 79 | Ht 66.0 in | Wt 204.0 lb

## 2021-03-26 DIAGNOSIS — M7989 Other specified soft tissue disorders: Secondary | ICD-10-CM

## 2021-03-26 DIAGNOSIS — R600 Localized edema: Secondary | ICD-10-CM | POA: Diagnosis not present

## 2021-03-26 NOTE — Progress Notes (Signed)
History of Present Illness  There is no documented history at this time  Assessments & Plan   There are no diagnoses linked to this encounter.    Additional instructions  Subjective:  Patient presents with venous ulcer of the Bilateral lower extremity.    Procedure:  3 layer unna wrap was placed Bilateral lower extremity.   Plan:   Follow up in one week.  

## 2021-04-01 ENCOUNTER — Encounter (INDEPENDENT_AMBULATORY_CARE_PROVIDER_SITE_OTHER): Payer: BC Managed Care – PPO

## 2021-04-02 ENCOUNTER — Other Ambulatory Visit: Payer: Self-pay

## 2021-04-02 ENCOUNTER — Ambulatory Visit (INDEPENDENT_AMBULATORY_CARE_PROVIDER_SITE_OTHER): Payer: BC Managed Care – PPO | Admitting: Nurse Practitioner

## 2021-04-02 VITALS — BP 153/76 | HR 76 | Resp 17 | Ht 66.0 in | Wt 202.0 lb

## 2021-04-02 DIAGNOSIS — M7989 Other specified soft tissue disorders: Secondary | ICD-10-CM

## 2021-04-02 DIAGNOSIS — R600 Localized edema: Secondary | ICD-10-CM | POA: Diagnosis not present

## 2021-04-02 NOTE — Progress Notes (Signed)
History of Present Illness  There is no documented history at this time  Assessments & Plan   There are no diagnoses linked to this encounter.    Additional instructions  Subjective:  Patient presents with venous ulcer of the Bilateral lower extremity.    Procedure:  3 layer unna wrap was placed Bilateral lower extremity.   Plan:   Follow up in one week.  

## 2021-04-05 ENCOUNTER — Encounter (INDEPENDENT_AMBULATORY_CARE_PROVIDER_SITE_OTHER): Payer: Self-pay | Admitting: Nurse Practitioner

## 2021-04-07 ENCOUNTER — Encounter (INDEPENDENT_AMBULATORY_CARE_PROVIDER_SITE_OTHER): Payer: Self-pay

## 2021-04-07 ENCOUNTER — Other Ambulatory Visit: Payer: Self-pay

## 2021-04-07 ENCOUNTER — Ambulatory Visit (INDEPENDENT_AMBULATORY_CARE_PROVIDER_SITE_OTHER): Payer: BC Managed Care – PPO | Admitting: Nurse Practitioner

## 2021-04-07 ENCOUNTER — Telehealth (INDEPENDENT_AMBULATORY_CARE_PROVIDER_SITE_OTHER): Payer: Self-pay

## 2021-04-07 VITALS — BP 150/68 | HR 72 | Resp 16

## 2021-04-07 DIAGNOSIS — L97211 Non-pressure chronic ulcer of right calf limited to breakdown of skin: Secondary | ICD-10-CM

## 2021-04-07 NOTE — Progress Notes (Signed)
History of Present Illness  There is no documented history at this time  Assessments & Plan   There are no diagnoses linked to this encounter.    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    Procedure:  3 layer unna wrap was placed Right lower extremity.   Plan:   Follow up in one week.   

## 2021-04-07 NOTE — Telephone Encounter (Signed)
Patient left a voicemail stating that he was having some bleeding through his wrap on the right leg.Patient informed that his last visit he notice an area that look like a blister.Patient will be coming today for unna wrap placed on the right leg

## 2021-04-09 ENCOUNTER — Other Ambulatory Visit: Payer: Self-pay

## 2021-04-09 ENCOUNTER — Encounter (INDEPENDENT_AMBULATORY_CARE_PROVIDER_SITE_OTHER): Payer: Self-pay | Admitting: Vascular Surgery

## 2021-04-09 ENCOUNTER — Ambulatory Visit (INDEPENDENT_AMBULATORY_CARE_PROVIDER_SITE_OTHER): Payer: BC Managed Care – PPO | Admitting: Vascular Surgery

## 2021-04-09 VITALS — BP 177/79 | HR 71 | Resp 17 | Ht 66.0 in | Wt 202.0 lb

## 2021-04-09 DIAGNOSIS — I70222 Atherosclerosis of native arteries of extremities with rest pain, left leg: Secondary | ICD-10-CM

## 2021-04-09 DIAGNOSIS — I10 Essential (primary) hypertension: Secondary | ICD-10-CM

## 2021-04-09 DIAGNOSIS — E785 Hyperlipidemia, unspecified: Secondary | ICD-10-CM

## 2021-04-09 DIAGNOSIS — E119 Type 2 diabetes mellitus without complications: Secondary | ICD-10-CM

## 2021-04-09 DIAGNOSIS — M7989 Other specified soft tissue disorders: Secondary | ICD-10-CM | POA: Insufficient documentation

## 2021-04-09 DIAGNOSIS — L97211 Non-pressure chronic ulcer of right calf limited to breakdown of skin: Secondary | ICD-10-CM

## 2021-04-09 NOTE — Assessment & Plan Note (Signed)
3 layer Unna boot was replaced today and will be changed weekly.  This will likely take 2-3 more weeks to get to heal up.  We can recheck him at that time.

## 2021-04-09 NOTE — Progress Notes (Signed)
MRN : [id number]  Damon English is a 70 y.o. (May 01, 1951) male who presents with chief complaint of  Chief Complaint  Patient presents with   Follow-up    Unna boot check  .  History of Present Illness: Patient returns today in follow up of his Unna boots and ulceration.  The ulcer is clean and healthy appearing.  It is a little smaller than a quarter on the right anterior lateral calf.  He did have some significant bleeding the other day.  His swelling is much better.  His legs are also feeling significantly better.  Current Outpatient Medications  Medication Sig Dispense Refill   apixaban (ELIQUIS) 5 MG TABS tablet Take by mouth.     atorvastatin (LIPITOR) 80 MG tablet Take 80 mg by mouth daily.     Blood Glucose Monitoring Suppl (ONE TOUCH ULTRA 2) w/Device KIT See admin instructions.     Calcipotriene-Betameth Diprop (ENSTILAR) 0.005-0.064 % FOAM APPLY THIN LAYER TO AFFECTED AREAS ONCE DAILY UNTIL FLAT AND ASYMPTOMATIC, MAX 4 WEEKS     clonazePAM (KLONOPIN) 0.5 MG tablet Take 0.25-0.5 mg by mouth 3 (three) times daily as needed for anxiety.     clopidogrel (PLAVIX) 75 MG tablet Take 75 mg by mouth daily.     DULoxetine (CYMBALTA) 60 MG capsule Take 1 capsule (60 mg total) by mouth 2 (two) times daily. 20 capsule 0   esomeprazole (NEXIUM) 40 MG capsule Take 1 capsule by mouth daily.     famotidine (PEPCID) 40 MG tablet Take 1 tablet by mouth every morning.     furosemide (LASIX) 20 MG tablet Take 40 mg by mouth daily.     glipiZIDE (GLUCOTROL XL) 10 MG 24 hr tablet Take 10 mg by mouth every morning.     HYDROcodone-acetaminophen (NORCO/VICODIN) 5-325 MG tablet Take 0.5-1 tablets by mouth 2 (two) times daily as needed for pain.     isosorbide mononitrate (IMDUR) 30 MG 24 hr tablet Take 30 mg by mouth daily.     ketoconazole (NIZORAL) 2 % shampoo Apply topically.     Latanoprost 0.005 % EMUL Apply to eye.     metoprolol succinate (TOPROL-XL) 50 MG 24 hr tablet Take 50 mg by mouth 2  (two) times a day. Take with or immediately following a meal.     midodrine (PROAMATINE) 2.5 MG tablet Take 2.5 mg by mouth daily.     nitroGLYCERIN (NITROSTAT) 0.4 MG SL tablet Place 0.4 mg under the tongue every 5 (five) minutes as needed for chest pain.     omeprazole (PRILOSEC) 40 MG capsule Take 40 mg by mouth daily.     ONETOUCH ULTRA test strip 2 (two) times daily.     ranolazine (RANEXA) 500 MG 12 hr tablet Take 500 mg by mouth 2 (two) times daily.     brimonidine (ALPHAGAN) 0.2 % ophthalmic solution  (Patient not taking: Reported on 04/09/2021)     cephALEXin (KEFLEX) 500 MG capsule Take 500 mg by mouth 3 (three) times daily. (Patient not taking: No sig reported)     Magnesium 250 MG TABS Take by mouth. (Patient not taking: Reported on 04/09/2021)     ramipril (ALTACE) 2.5 MG capsule Take by mouth.     SENNOSIDES PO Take by mouth.     spironolactone (ALDACTONE) 25 MG tablet Take 25 mg by mouth.     sucralfate (CARAFATE) 1 g tablet Take 1 tablet by mouth 4 (four) times daily -  before meals and at  bedtime. (Patient not taking: No sig reported)     No current facility-administered medications for this visit.    Past Medical History:  Diagnosis Date   Alcohol use    "cutting back" but still heavy and daily, multiple shots of bourbon each night   Diabetes mellitus without complication ([place])    History of tobacco use    reportedly quit in 2013   Hypertension    Pulmonary embolism ([place]) 2012   unprovoked   PVD (peripheral vascular disease) ([place])     Past Surgical History:  Procedure Laterality Date   BACK SURGERY     CHOLECYSTECTOMY     CORONARY ANGIOGRAPHY N/A 01/30/2019   Procedure: CORONARY ANGIOGRAPHY;  Surgeon: Corey Skains, MD;  Location: [facility];  Service: Cardiovascular;  Laterality: N/A;   ERCP W/ SPHICTEROTOMY  2015   FEMORAL ARTERY - POPLITEAL ARTERY BYPASS GRAFT Right 06/19/2018   LOWER EXTREMITY ANGIOGRAPHY Left 01/14/2021   Procedure: LOWER  EXTREMITY ANGIOGRAPHY;  Surgeon: Algernon Huxley, MD;  Location: [facility];  Service: Cardiovascular;  Laterality: Left;   THROMBOENDARTERECTOMY Right 06/19/2018   TONSILLECTOMY     VASCULAR SURGERY       Social History   Tobacco Use   Smoking status: Former    Packs/day: 2.00    Years: 40.00    Pack years: 80.00    Types: Cigarettes    Quit date: 11/27/2010    Years since quitting: 10.3   Smokeless tobacco: Never   Tobacco comments:    Quit in 2012  Substance Use Topics   Alcohol use: Yes    Alcohol/week: 7.0 standard drinks    Types: 7 Shots of liquor per week   Drug use: Never      Family History  Problem Relation Age of Onset   Hypertension Mother    Parkinson's disease Mother    Heart attack Father    Vascular Disease Father    AAA (abdominal aortic aneurysm) Father    Alzheimer's disease Father      Allergies  Allergen Reactions   Amoxicillin Other (See Comments)    Abdominal cramps Other reaction(s): Other (See Comments) Abdominal cramps Tolerated ancef 7/16   Hydromorphone Hcl     Quick & severe nausea/vomiting  Other reaction(s): Vomiting Quick & severe nausea/vomiting    Aspirin     Other reaction(s): Other (See Comments) 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  Is on NOAC for now.  No ASA with it. Not allergic per patient.       REVIEW OF SYSTEMS (Negative unless checked)   Constitutional: '[]' Weight loss  '[]' Fever  '[]' Chills Cardiac: '[]' Chest pain   '[]' Chest pressure   '[]' Palpitations   '[]' Shortness of breath when laying flat   '[]' Shortness of breath at rest   '[]' Shortness of breath with exertion. Vascular:  '[x]' Pain in legs with walking   '[]' Pain in legs at rest   '[x]' Pain in legs when laying flat   '[]' Claudication   '[]' Pain in feet when walking  '[]' Pain in feet at rest  '[]' Pain in feet when laying flat   '[]' History of DVT   '[]' Phlebitis   '[x]' Swelling in legs   '[]' Varicose veins   '[]' Non-healing ulcers Pulmonary:   '[]' Uses home oxygen   '[]' Productive cough    '[]' Hemoptysis   '[]' Wheeze  '[]' COPD   '[]' Asthma Neurologic:  '[]' Dizziness  '[]' Blackouts   '[]' Seizures   '[]' History of stroke   '[]' History of TIA  '[]' Aphasia   '[]' Temporary blindness   '[]' Dysphagia   '[]'   Weakness or numbness in arms   '[]' Weakness or numbness in legs Musculoskeletal:  '[x]' Arthritis   '[]' Joint swelling   '[x]' Joint pain   '[]' Low back pain Hematologic:  '[]' Easy bruising  '[]' Easy bleeding   '[]' Hypercoagulable state   '[]' Anemic  '[]' Hepatitis Gastrointestinal:  '[]' Blood in stool   '[]' Vomiting blood  '[]' Gastroesophageal reflux/heartburn   '[]' Abdominal pain Genitourinary:  '[]' Chronic kidney disease   '[]' Difficult urination  '[]' Frequent urination  '[]' Burning with urination   '[]' Hematuria Skin:  '[]' Rashes   '[]' Ulcers   '[]' Wounds Psychological:  '[]' History of anxiety   '[]'  History of major depression.  Physical Examination  BP (!) 177/79 (BP Location: Right Arm)   Pulse 71   Resp 17   Ht '5\' 6"'  (1.676 m)   Wt 202 lb (91.6 kg)   BMI 32.60 kg/m  Gen:  WD/WN, NAD Head: [place]/AT, No temporalis wasting. Ear/Nose/Throat: Hearing grossly intact, nares w/o erythema or drainage Eyes: Conjunctiva clear. Sclera non-icteric Neck: Supple.  Trachea midline Pulmonary:  Good air movement, no use of accessory muscles.  Cardiac: RRR, no JVD Vascular:  Vessel Right Left  Radial Palpable Palpable                          PT 1+ palpable Not palpable  DP 1+ palpable 1+ palpable    Musculoskeletal: M/S 5/5 throughout.  No deformity or atrophy.  Small, superficial, quarter sized ulceration on the right anterior lateral calf.  1+ right lower extremity edema, trace left lower extremity edema. Neurologic: Sensation grossly intact in extremities.  Symmetrical.  Speech is fluent.  Psychiatric: Judgment intact, Mood & affect appropriate for pt's clinical situation. Dermatologic: Right calf wound as above      Labs Recent Results (from the past 2160 hour(s))  SARS CORONAVIRUS 2 (TAT 6-24 HRS) Nasopharyngeal Nasopharyngeal Swab      Status: None   Collection Time: 01/12/21  1:44 PM   Specimen: Nasopharyngeal Swab  Result Value Ref Range   SARS Coronavirus 2 NEGATIVE NEGATIVE    Comment: (NOTE) SARS-CoV-2 target nucleic acids are NOT DETECTED.  The SARS-CoV-2 RNA is generally detectable in upper and lower respiratory specimens during the acute phase of infection. Negative results do not preclude SARS-CoV-2 infection, do not rule out co-infections with other pathogens, and should not be used as the sole basis for treatment or other patient management decisions. Negative results must be combined with clinical observations, patient history, and epidemiological information. The expected result is Negative.  Fact Sheet for Patients: SugarRoll.be  Fact Sheet for Healthcare Providers: https://www.woods-mathews.com/  This test is not yet approved or cleared by the [place] FDA and  has been authorized for detection and/or diagnosis of SARS-CoV-2 by FDA under an Emergency Use Authorization (EUA). This EUA will remain  in effect (meaning this test can be used) for the duration of the COVID-19 declaration under Se ction 564(b)(1) of the Act, 21 U.S.C. section 360bbb-3(b)(1), unless the authorization is terminated or revoked sooner.  Performed at [facility], [place] [address], [place], [place] [address]   BUN     Status: None   Collection Time: 01/14/21  8:57 AM  Result Value Ref Range   BUN 20 8 - 23 mg/dL    Comment: Performed at [facility], [address] [place], [place] [address]  Creatinine, serum     Status: None   Collection Time: 01/14/21  8:57 AM  Result Value Ref Range   Creatinine, Ser 1.12 0.61 -  1.24 mg/dL   GFR, Estimated >60 >60 mL/min    Comment: (NOTE) Calculated using the CKD-EPI Creatinine Equation (2021) Performed at [facility], [address] [place], [place] [address]   Glucose, capillary     Status: None    Collection Time: 01/14/21  9:05 AM  Result Value Ref Range   Glucose-Capillary 99 70 - 99 mg/dL    Comment: Glucose reference range applies only to samples taken after fasting for at least 8 hours.  Glucose, capillary     Status: Abnormal   Collection Time: 01/14/21 10:53 AM  Result Value Ref Range   Glucose-Capillary 136 (H) 70 - 99 mg/dL    Comment: Glucose reference range applies only to samples taken after fasting for at least 8 hours.    Radiology VAS [place] LOWER EXTREMITY VENOUS (DVT)  Result Date: 03/16/2021  Lower Venous DVT Study Patient Name:  Damon English  Date of Exam:   03/12/2021 Medical Rec #: [id number]         Accession #:    [id number] Date of Birth: Mar 08, 1951          Patient Gender: M Patient Age:   70Y Exam Location:  [facility] Procedure:      VAS [place] LOWER EXTREMITY VENOUS (DVT) Referring Phys: [id number] Lucretia Roers BROWN --------------------------------------------------------------------------------  Indications: Pain, Swelling, and rt leg swelling lt foot pain.  Performing Technologist: Concha Norway RVT  Examination Guidelines: A complete evaluation includes B-mode imaging, spectral Doppler, color Doppler, and power Doppler as needed of all accessible portions of each vessel. Bilateral testing is considered an integral part of a complete examination. Limited examinations for reoccurring indications may be performed as noted. The reflux portion of the exam is performed with the patient in reverse Trendelenburg.  +---------+---------------+---------+-----------+----------+--------------+ RIGHT    CompressibilityPhasicitySpontaneityPropertiesThrombus Aging +---------+---------------+---------+-----------+----------+--------------+ CFV      Full           Yes      Yes                                 +---------+---------------+---------+-----------+----------+--------------+ SFJ      Full           Yes      Yes                                  +---------+---------------+---------+-----------+----------+--------------+ FV Prox  Full           Yes      Yes                                 +---------+---------------+---------+-----------+----------+--------------+ FV Mid   Full           Yes      Yes                                 +---------+---------------+---------+-----------+----------+--------------+ FV DistalFull           Yes      Yes                                 +---------+---------------+---------+-----------+----------+--------------+ PFV      Full  Yes      Yes                                 +---------+---------------+---------+-----------+----------+--------------+ POP      Full           Yes      Yes                                 +---------+---------------+---------+-----------+----------+--------------+ PTV      Full           Yes      Yes                                 +---------+---------------+---------+-----------+----------+--------------+ PERO     Full           Yes      Yes                                 +---------+---------------+---------+-----------+----------+--------------+   +---------+---------------+---------+-----------+----------+--------------+ LEFT     CompressibilityPhasicitySpontaneityPropertiesThrombus Aging +---------+---------------+---------+-----------+----------+--------------+ CFV      Full           Yes      Yes                                 +---------+---------------+---------+-----------+----------+--------------+ SFJ      Full           Yes      Yes                                 +---------+---------------+---------+-----------+----------+--------------+ FV Prox  Full           Yes      Yes                                 +---------+---------------+---------+-----------+----------+--------------+ FV Mid   Full           Yes      Yes                                  +---------+---------------+---------+-----------+----------+--------------+ FV DistalFull           Yes      Yes                                 +---------+---------------+---------+-----------+----------+--------------+ PFV      Full           Yes      Yes                                 +---------+---------------+---------+-----------+----------+--------------+ POP      Full           Yes      Yes                                 +---------+---------------+---------+-----------+----------+--------------+  PTV      Full           Yes      Yes                                 +---------+---------------+---------+-----------+----------+--------------+ PERO     Full           Yes      Yes                                 +---------+---------------+---------+-----------+----------+--------------+     Summary: RIGHT: - No evidence of deep vein thrombosis in the lower extremity. No indirect evidence of obstruction proximal to the inguinal ligament. - GSV used for InSitu BPG which shows normal arterial flow.  LEFT: - No evidence of deep vein thrombosis in the lower extremity. No indirect evidence of obstruction proximal to the inguinal ligament. - Left GSV stripped for CABG years ago.  *See table(s) above for measurements and observations. Electronically signed by [name] MD on 03/16/2021 at 12:41:18 PM.    Final     Assessment/Plan HTN (hypertension) blood pressure control important in reducing the progression of atherosclerotic disease. On appropriate oral medications.     Non-insulin dependent type 2 diabetes mellitus (HCC) blood glucose control important in reducing the progression of atherosclerotic disease. Also, involved in wound healing. On appropriate medications.     Hyperlipidemia lipid control important in reducing the progression of atherosclerotic disease. Continue statin therapy  Swelling of limb Unna boots have gotten swelling under much better  control.  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  We can check ABIs in the next couple of months once we get his ulcer healed on the right leg where his perfusion is normal.  Lower limb ulcer, calf, right, limited to breakdown of skin (HCC) 3 layer Unna boot was replaced today and will be changed weekly.  This will likely take 2-3 more weeks to get to heal up.  We can recheck him at that time.    [name], MD  04/09/2021 11:13 AM    This note was created with Dragon medical transcription system.  Any errors from dictation are purely unintentional

## 2021-04-09 NOTE — Assessment & Plan Note (Signed)
Legs are feeling much better and he is walking more.  We can check ABIs in the next couple of months once we get his ulcer healed on the right leg where his perfusion is normal.

## 2021-04-09 NOTE — Assessment & Plan Note (Signed)
Unna boots have gotten swelling under much better control.

## 2021-04-12 ENCOUNTER — Encounter (INDEPENDENT_AMBULATORY_CARE_PROVIDER_SITE_OTHER): Payer: Self-pay | Admitting: Nurse Practitioner

## 2021-04-14 DIAGNOSIS — H401132 Primary open-angle glaucoma, bilateral, moderate stage: Secondary | ICD-10-CM | POA: Diagnosis not present

## 2021-04-14 DIAGNOSIS — H25813 Combined forms of age-related cataract, bilateral: Secondary | ICD-10-CM | POA: Diagnosis not present

## 2021-04-15 ENCOUNTER — Telehealth (INDEPENDENT_AMBULATORY_CARE_PROVIDER_SITE_OTHER): Payer: Self-pay

## 2021-04-15 NOTE — Telephone Encounter (Signed)
Patient left a voicemail requesting to take his wraps off to be able to get into a pool. I spoke with Eulogio Ditch NP an she recommended the patient could take the wraps off but if he has any open wounds she does not recommend getting the pool. The patient will be keeping his upcoming appointment.

## 2021-04-16 ENCOUNTER — Other Ambulatory Visit: Payer: Self-pay

## 2021-04-16 ENCOUNTER — Ambulatory Visit (INDEPENDENT_AMBULATORY_CARE_PROVIDER_SITE_OTHER): Payer: BC Managed Care – PPO | Admitting: Nurse Practitioner

## 2021-04-16 ENCOUNTER — Encounter (INDEPENDENT_AMBULATORY_CARE_PROVIDER_SITE_OTHER): Payer: Self-pay

## 2021-04-16 VITALS — BP 151/78 | HR 82 | Resp 16 | Wt 203.8 lb

## 2021-04-16 DIAGNOSIS — R600 Localized edema: Secondary | ICD-10-CM | POA: Diagnosis not present

## 2021-04-16 DIAGNOSIS — M7989 Other specified soft tissue disorders: Secondary | ICD-10-CM

## 2021-04-16 NOTE — Progress Notes (Signed)
History of Present Illness  There is no documented history at this time  Assessments & Plan   There are no diagnoses linked to this encounter.    Additional instructions  Subjective:  Patient presents with venous ulcer of the Bilateral lower extremity.    Procedure:  3 layer unna wrap was placed Bilateral lower extremity.   Plan:   Follow up in one week.  

## 2021-04-20 DIAGNOSIS — Z86711 Personal history of pulmonary embolism: Secondary | ICD-10-CM | POA: Diagnosis not present

## 2021-04-20 DIAGNOSIS — E854 Organ-limited amyloidosis: Secondary | ICD-10-CM | POA: Diagnosis not present

## 2021-04-20 DIAGNOSIS — I680 Cerebral amyloid angiopathy: Secondary | ICD-10-CM | POA: Diagnosis not present

## 2021-04-20 DIAGNOSIS — Z7901 Long term (current) use of anticoagulants: Secondary | ICD-10-CM | POA: Diagnosis not present

## 2021-04-20 DIAGNOSIS — Z8674 Personal history of sudden cardiac arrest: Secondary | ICD-10-CM | POA: Diagnosis not present

## 2021-04-20 DIAGNOSIS — Z7902 Long term (current) use of antithrombotics/antiplatelets: Secondary | ICD-10-CM | POA: Diagnosis not present

## 2021-04-20 DIAGNOSIS — I110 Hypertensive heart disease with heart failure: Secondary | ICD-10-CM | POA: Diagnosis not present

## 2021-04-20 DIAGNOSIS — E119 Type 2 diabetes mellitus without complications: Secondary | ICD-10-CM | POA: Diagnosis not present

## 2021-04-20 DIAGNOSIS — I10 Essential (primary) hypertension: Secondary | ICD-10-CM | POA: Diagnosis not present

## 2021-04-20 DIAGNOSIS — H2513 Age-related nuclear cataract, bilateral: Secondary | ICD-10-CM | POA: Diagnosis not present

## 2021-04-20 DIAGNOSIS — Z01818 Encounter for other preprocedural examination: Secondary | ICD-10-CM | POA: Diagnosis not present

## 2021-04-20 DIAGNOSIS — Z951 Presence of aortocoronary bypass graft: Secondary | ICD-10-CM | POA: Diagnosis not present

## 2021-04-20 DIAGNOSIS — I5022 Chronic systolic (congestive) heart failure: Secondary | ICD-10-CM | POA: Diagnosis not present

## 2021-04-22 DIAGNOSIS — I110 Hypertensive heart disease with heart failure: Secondary | ICD-10-CM | POA: Diagnosis not present

## 2021-04-22 DIAGNOSIS — I509 Heart failure, unspecified: Secondary | ICD-10-CM | POA: Diagnosis not present

## 2021-04-22 DIAGNOSIS — H52221 Regular astigmatism, right eye: Secondary | ICD-10-CM | POA: Diagnosis not present

## 2021-04-22 DIAGNOSIS — H25811 Combined forms of age-related cataract, right eye: Secondary | ICD-10-CM | POA: Diagnosis not present

## 2021-04-23 ENCOUNTER — Encounter (INDEPENDENT_AMBULATORY_CARE_PROVIDER_SITE_OTHER): Payer: BC Managed Care – PPO

## 2021-04-26 ENCOUNTER — Encounter (INDEPENDENT_AMBULATORY_CARE_PROVIDER_SITE_OTHER): Payer: Self-pay | Admitting: Nurse Practitioner

## 2021-04-28 ENCOUNTER — Ambulatory Visit (INDEPENDENT_AMBULATORY_CARE_PROVIDER_SITE_OTHER): Payer: BC Managed Care – PPO | Admitting: Nurse Practitioner

## 2021-04-28 ENCOUNTER — Other Ambulatory Visit: Payer: Self-pay

## 2021-04-28 VITALS — BP 176/80 | HR 83 | Ht 66.0 in | Wt 207.0 lb

## 2021-04-28 DIAGNOSIS — M7989 Other specified soft tissue disorders: Secondary | ICD-10-CM

## 2021-04-28 DIAGNOSIS — R600 Localized edema: Secondary | ICD-10-CM

## 2021-04-28 NOTE — Progress Notes (Signed)
History of Present Illness  There is no documented history at this time  Assessments & Plan   There are no diagnoses linked to this encounter.    Additional instructions  Subjective:  Patient presents with venous ulcer of the Bilateral lower extremity.    Procedure:  3 layer unna wrap was placed Bilateral lower extremity.   Plan:   Follow up in one week.  

## 2021-04-29 DIAGNOSIS — E119 Type 2 diabetes mellitus without complications: Secondary | ICD-10-CM | POA: Diagnosis not present

## 2021-04-29 DIAGNOSIS — H524 Presbyopia: Secondary | ICD-10-CM | POA: Diagnosis not present

## 2021-04-29 DIAGNOSIS — H25812 Combined forms of age-related cataract, left eye: Secondary | ICD-10-CM | POA: Diagnosis not present

## 2021-04-29 DIAGNOSIS — I110 Hypertensive heart disease with heart failure: Secondary | ICD-10-CM | POA: Diagnosis not present

## 2021-04-29 DIAGNOSIS — H52222 Regular astigmatism, left eye: Secondary | ICD-10-CM | POA: Diagnosis not present

## 2021-04-30 ENCOUNTER — Encounter (INDEPENDENT_AMBULATORY_CARE_PROVIDER_SITE_OTHER): Payer: BC Managed Care – PPO

## 2021-05-01 ENCOUNTER — Encounter (INDEPENDENT_AMBULATORY_CARE_PROVIDER_SITE_OTHER): Payer: Self-pay | Admitting: Nurse Practitioner

## 2021-05-07 ENCOUNTER — Other Ambulatory Visit: Payer: Self-pay

## 2021-05-07 ENCOUNTER — Ambulatory Visit (INDEPENDENT_AMBULATORY_CARE_PROVIDER_SITE_OTHER): Payer: BC Managed Care – PPO | Admitting: Nurse Practitioner

## 2021-05-07 ENCOUNTER — Encounter (INDEPENDENT_AMBULATORY_CARE_PROVIDER_SITE_OTHER): Payer: Self-pay | Admitting: Nurse Practitioner

## 2021-05-07 VITALS — BP 173/76 | HR 72 | Resp 16 | Wt 205.4 lb

## 2021-05-07 DIAGNOSIS — I10 Essential (primary) hypertension: Secondary | ICD-10-CM

## 2021-05-07 DIAGNOSIS — I70222 Atherosclerosis of native arteries of extremities with rest pain, left leg: Secondary | ICD-10-CM | POA: Diagnosis not present

## 2021-05-07 DIAGNOSIS — M7989 Other specified soft tissue disorders: Secondary | ICD-10-CM

## 2021-05-13 ENCOUNTER — Telehealth (INDEPENDENT_AMBULATORY_CARE_PROVIDER_SITE_OTHER): Payer: Self-pay | Admitting: Nurse Practitioner

## 2021-05-13 NOTE — Telephone Encounter (Signed)
Patient got wrapped removed from legs from his last visit, and was instructed to call back in if right leg begin to swell again.  Patient leg has began to swell again and now has a sore on the the back of his leg where he has scratched it.  Patient would like to be seen again if possible. Please advise.

## 2021-05-13 NOTE — Telephone Encounter (Signed)
Bring patient in for unna wraps  

## 2021-05-16 ENCOUNTER — Encounter (INDEPENDENT_AMBULATORY_CARE_PROVIDER_SITE_OTHER): Payer: Self-pay | Admitting: Nurse Practitioner

## 2021-05-16 NOTE — Progress Notes (Signed)
Subjective:    Patient ID: Damon English, male    DOB: [birth year], 70 y.o.   MRN: [id number] Chief Complaint  Patient presents with  . Follow-up    4 wk unna boot check    Damon English is a 70 year old male that returns today for follow-up in regards to lower extremity edema.  Patient was in [place] wraps for the last 4 weeks.  This comes following endovascular intervention for the patient had some extensive swelling due to reperfusion symptoms.  The patient notes that the swelling is much improved since beginning the wraps.  The skin changes have also reversed.  He denies any new wounds or ulcerations.   Review of Systems  Cardiovascular:  Positive for leg swelling.  All other systems reviewed and are negative.     Objective:   Physical Exam Vitals reviewed.  HENT:     Head: Normocephalic.  Cardiovascular:     Rate and Rhythm: Normal rate.  Pulmonary:     Effort: Pulmonary effort is normal.  Musculoskeletal:     Right lower leg: Edema present.  Neurological:     Mental Status: He is alert and oriented to person, place, and time.  Psychiatric:        Mood and Affect: Mood normal.        Behavior: Behavior normal.        Thought Content: Thought content normal.        Judgment: Judgment normal.    BP (!) 173/76 (BP Location: Right Arm)   Pulse 72   Resp 16   Wt 205 lb 6.4 oz (93.2 kg)   BMI 33.15 kg/m   Past Medical History:  Diagnosis Date  . Alcohol use    "cutting back" but still heavy and daily, multiple shots of bourbon each night  . Diabetes mellitus without complication ([place])   . History of tobacco use    reportedly quit in 2013  . Hypertension   . Pulmonary embolism ([place]) 2012   unprovoked  . PVD (peripheral vascular disease) ([place])     Social History   Socioeconomic History  . Marital status: Married    Spouse name: Not on file  . Number of children: Not on file  . Years of education: Not on file  . Highest education level: Not on file   Occupational History  . Not on file  Tobacco Use  . Smoking status: Former    Packs/day: 2.00    Years: 40.00    Pack years: 80.00    Types: Cigarettes    Quit date: 11/27/2010    Years since quitting: 10.4  . Smokeless tobacco: Never  . Tobacco comments:    Quit in 2012  Substance and Sexual Activity  . Alcohol use: Yes    Alcohol/week: 7.0 standard drinks    Types: 7 Shots of liquor per week  . Drug use: Never  . Sexual activity: Not on file  Other Topics Concern  . Not on file  Social History Narrative  . Not on file   Social Determinants of Health   Financial Resource Strain: Not on file  Food Insecurity: Not on file  Transportation Needs: Not on file  Physical Activity: Not on file  Stress: Not on file  Social Connections: Not on file  Intimate Partner Violence: Not on file    Past Surgical History:  Procedure Laterality Date  . BACK SURGERY    . CHOLECYSTECTOMY    . CORONARY ANGIOGRAPHY N/A 01/30/2019  Procedure: CORONARY ANGIOGRAPHY;  Surgeon: Corey Skains, MD;  Location: [facility];  Service: Cardiovascular;  Laterality: N/A;  . ERCP W/ SPHICTEROTOMY  2015  . FEMORAL ARTERY - POPLITEAL ARTERY BYPASS GRAFT Right 06/19/2018  . LOWER EXTREMITY ANGIOGRAPHY Left 01/14/2021   Procedure: LOWER EXTREMITY ANGIOGRAPHY;  Surgeon: Algernon Huxley, MD;  Location: [facility];  Service: Cardiovascular;  Laterality: Left;  . THROMBOENDARTERECTOMY Right 06/19/2018  . TONSILLECTOMY    . VASCULAR SURGERY      Family History  Problem Relation Age of Onset  . Hypertension Mother   . Parkinson's disease Mother   . Heart attack Father   . Vascular Disease Father   . AAA (abdominal aortic aneurysm) Father   . Alzheimer's disease Father     Allergies  Allergen Reactions  . Amoxicillin Other (See Comments)    Abdominal cramps Other reaction(s): Other (See Comments) Abdominal cramps Tolerated ancef 7/16  . Hydromorphone Hcl     Quick & severe  nausea/vomiting  Other reaction(s): Vomiting Quick & severe nausea/vomiting   . 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  Is on NOAC for now.  No ASA with it. Not allergic per patient.     CBC Latest Ref Rng & Units 01/30/2019 01/29/2019 01/28/2019  WBC 4.0 - 10.5 K/uL 7.2 8.1 7.8  Hemoglobin 13.0 - 17.0 g/dL 14.0 13.8 14.4  Hematocrit 39.0 - 52.0 % 41.1 40.5 42.2  Platelets 150 - 400 K/uL 133(L) 141(L) 134(L)      CMP     Component Value Date/Time   NA 136 01/30/2019 0652   NA 136 11/08/2013 1530   K 3.7 01/30/2019 0652   K 3.4 (L) 11/08/2013 1530   CL 104 01/30/2019 0652   CL 102 11/08/2013 1530   CO2 24 01/30/2019 0652   CO2 27 11/08/2013 1530   GLUCOSE 140 (H) 01/30/2019 0652   GLUCOSE 150 (H) 11/08/2013 1530   BUN 20 01/14/2021 0857   BUN 16 11/08/2013 1530   CREATININE 1.12 01/14/2021 0857   CREATININE 1.07 11/08/2013 1530   CALCIUM 8.4 (L) 01/30/2019 0652   CALCIUM 9.3 11/08/2013 1530   PROT 7.5 01/28/2019 0331   ALBUMIN 4.1 01/28/2019 0331   AST 46 (H) 01/28/2019 0331   ALT 34 01/28/2019 0331   ALKPHOS 51 01/28/2019 0331   BILITOT 1.1 01/28/2019 0331   GFRNONAA >60 01/14/2021 0857   GFRNONAA >60 11/08/2013 1530   GFRAA >60 01/30/2019 0652   GFRAA >60 11/08/2013 1530     No results found.     Assessment & Plan:   1. Atherosclerosis of native artery of left lower extremity with rest pain (HCC) Previous noninvasive studies showed improvement.  And will return for follow-up noninvasive studies at the patient's return visit.  We will obtain ABIs at that time.  2. Primary hypertension Continue antihypertensive medications as already ordered, these medications have been reviewed and there are no changes at this time.   3. Swelling of limb The patient swelling is improved, patient is advised to continue with conservative therapy such as use of medical grade compression stockings.  The stockings should be 20 to 30  mmHg in place daily.  They should replace first thing in the morning and remove before bedtime.  Patient should not sleep in the stockings.  He should also elevate his lower extremities is much as possible in addition to trying to get 20 to 30 minutes  of exercise 4 to 5 days a week.  We will have the patient return in 6 weeks to evaluate progress.   Current Outpatient Medications on File Prior to Visit  Medication Sig Dispense Refill  . apixaban (ELIQUIS) 5 MG TABS tablet Take by mouth.    Marland Kitchen atorvastatin (LIPITOR) 80 MG tablet Take 80 mg by mouth daily.    . Blood Glucose Monitoring Suppl (ONE TOUCH ULTRA 2) w/Device KIT See admin instructions.    . Calcipotriene-Betameth Diprop (ENSTILAR) 0.005-0.064 % FOAM APPLY THIN LAYER TO AFFECTED AREAS ONCE DAILY UNTIL FLAT AND ASYMPTOMATIC, MAX 4 WEEKS    . clonazePAM (KLONOPIN) 0.5 MG tablet Take 0.25-0.5 mg by mouth 3 (three) times daily as needed for anxiety.    . clopidogrel (PLAVIX) 75 MG tablet Take 75 mg by mouth daily.    . DULoxetine (CYMBALTA) 60 MG capsule Take 1 capsule (60 mg total) by mouth 2 (two) times daily. 20 capsule 0  . esomeprazole (NEXIUM) 40 MG capsule Take 1 capsule by mouth daily.    . famotidine (PEPCID) 40 MG tablet Take 1 tablet by mouth every morning.    . furosemide (LASIX) 20 MG tablet Take 40 mg by mouth daily.    Marland Kitchen glipiZIDE (GLUCOTROL XL) 10 MG 24 hr tablet Take 10 mg by mouth every morning.    Marland Kitchen HYDROcodone-acetaminophen (NORCO/VICODIN) 5-325 MG tablet Take 0.5-1 tablets by mouth 2 (two) times daily as needed for pain.    . isosorbide mononitrate (IMDUR) 30 MG 24 hr tablet Take 30 mg by mouth daily.    Marland Kitchen ketoconazole (NIZORAL) 2 % shampoo Apply topically.    . Latanoprost 0.005 % EMUL Apply to eye.    . metoprolol succinate (TOPROL-XL) 50 MG 24 hr tablet Take 50 mg by mouth 2 (two) times a day. Take with or immediately following a meal.    . midodrine (PROAMATINE) 2.5 MG tablet Take 2.5 mg by mouth daily.    .  nitroGLYCERIN (NITROSTAT) 0.4 MG SL tablet Place 0.4 mg under the tongue every 5 (five) minutes as needed for chest pain.    Marland Kitchen omeprazole (PRILOSEC) 40 MG capsule Take 40 mg by mouth daily.    Glory Rosebush ULTRA test strip 2 (two) times daily.    . ranolazine (RANEXA) 500 MG 12 hr tablet Take 500 mg by mouth 2 (two) times daily.    . SENNOSIDES PO Take by mouth.    . brimonidine (ALPHAGAN) 0.2 % ophthalmic solution  (Patient not taking: No sig reported)    . cephALEXin (KEFLEX) 500 MG capsule Take 500 mg by mouth 3 (three) times daily. (Patient not taking: No sig reported)    . Magnesium 250 MG TABS Take by mouth. (Patient not taking: No sig reported)    . ramipril (ALTACE) 2.5 MG capsule Take by mouth.    . spironolactone (ALDACTONE) 25 MG tablet Take 25 mg by mouth.    . sucralfate (CARAFATE) 1 g tablet Take 1 tablet by mouth 4 (four) times daily -  before meals and at bedtime. (Patient not taking: No sig reported)     No current facility-administered medications on file prior to visit.    There are no Patient Instructions on file for this visit. No follow-ups on file.   Kris Hartmann, NP

## 2021-05-17 ENCOUNTER — Telehealth (INDEPENDENT_AMBULATORY_CARE_PROVIDER_SITE_OTHER): Payer: Self-pay

## 2021-05-17 NOTE — Telephone Encounter (Signed)
Pt called and left a VM on the nurses line saying he has been having swelling in his legs for the past 4-5 day , he was recently in wraps and was taken out the pt might benefit for being re-wrapped . Please advise.

## 2021-05-17 NOTE — Telephone Encounter (Signed)
Pt called back and said that his wife wrapped his legs and that the sore has healed  I made the NP aware and she said as long as they have healed and no swelling he is fine but he should wear his compression  I made the pt aware.

## 2021-05-17 NOTE — Telephone Encounter (Signed)
Yes, he can come in for wraps

## 2021-05-18 ENCOUNTER — Ambulatory Visit (INDEPENDENT_AMBULATORY_CARE_PROVIDER_SITE_OTHER): Payer: Medicare Other | Admitting: Vascular Surgery

## 2021-05-18 ENCOUNTER — Encounter (INDEPENDENT_AMBULATORY_CARE_PROVIDER_SITE_OTHER): Payer: Medicare Other

## 2021-06-18 ENCOUNTER — Inpatient Hospital Stay: Payer: BC Managed Care – PPO

## 2021-06-18 ENCOUNTER — Encounter (INDEPENDENT_AMBULATORY_CARE_PROVIDER_SITE_OTHER): Payer: BC Managed Care – PPO

## 2021-06-18 ENCOUNTER — Other Ambulatory Visit (INDEPENDENT_AMBULATORY_CARE_PROVIDER_SITE_OTHER): Payer: Self-pay | Admitting: Vascular Surgery

## 2021-06-18 ENCOUNTER — Encounter (INDEPENDENT_AMBULATORY_CARE_PROVIDER_SITE_OTHER): Payer: Self-pay | Admitting: Nurse Practitioner

## 2021-06-18 ENCOUNTER — Emergency Department: Payer: BC Managed Care – PPO

## 2021-06-18 ENCOUNTER — Ambulatory Visit (INDEPENDENT_AMBULATORY_CARE_PROVIDER_SITE_OTHER): Payer: BC Managed Care – PPO | Admitting: Nurse Practitioner

## 2021-06-18 ENCOUNTER — Other Ambulatory Visit: Payer: Self-pay

## 2021-06-18 ENCOUNTER — Ambulatory Visit (INDEPENDENT_AMBULATORY_CARE_PROVIDER_SITE_OTHER): Payer: BC Managed Care – PPO

## 2021-06-18 ENCOUNTER — Other Ambulatory Visit (INDEPENDENT_AMBULATORY_CARE_PROVIDER_SITE_OTHER): Payer: BC Managed Care – PPO

## 2021-06-18 ENCOUNTER — Ambulatory Visit (INDEPENDENT_AMBULATORY_CARE_PROVIDER_SITE_OTHER): Payer: BC Managed Care – PPO | Admitting: Vascular Surgery

## 2021-06-18 ENCOUNTER — Inpatient Hospital Stay
Admission: EM | Admit: 2021-06-18 | Discharge: 2021-06-26 | DRG: 871 | Disposition: A | Discharge status: Other Acute Inpatient Hospital | Payer: BC Managed Care – PPO | Source: Ambulatory Visit | Attending: Internal Medicine | Admitting: Internal Medicine

## 2021-06-18 ENCOUNTER — Encounter: Payer: Self-pay | Admitting: Pulmonary Disease

## 2021-06-18 VITALS — BP 138/62 | HR 51 | Resp 16

## 2021-06-18 DIAGNOSIS — Z7901 Long term (current) use of anticoagulants: Secondary | ICD-10-CM

## 2021-06-18 DIAGNOSIS — Z86718 Personal history of other venous thrombosis and embolism: Secondary | ICD-10-CM

## 2021-06-18 DIAGNOSIS — I214 Non-ST elevation (NSTEMI) myocardial infarction: Secondary | ICD-10-CM | POA: Diagnosis not present

## 2021-06-18 DIAGNOSIS — Z20822 Contact with and (suspected) exposure to covid-19: Secondary | ICD-10-CM | POA: Diagnosis present

## 2021-06-18 DIAGNOSIS — I255 Ischemic cardiomyopathy: Secondary | ICD-10-CM | POA: Diagnosis present

## 2021-06-18 DIAGNOSIS — L03115 Cellulitis of right lower limb: Secondary | ICD-10-CM

## 2021-06-18 DIAGNOSIS — I4901 Ventricular fibrillation: Secondary | ICD-10-CM | POA: Diagnosis not present

## 2021-06-18 DIAGNOSIS — R7401 Elevation of levels of liver transaminase levels: Secondary | ICD-10-CM

## 2021-06-18 DIAGNOSIS — I442 Atrioventricular block, complete: Secondary | ICD-10-CM | POA: Diagnosis present

## 2021-06-18 DIAGNOSIS — R059 Cough, unspecified: Secondary | ICD-10-CM | POA: Diagnosis not present

## 2021-06-18 DIAGNOSIS — L89151 Pressure ulcer of sacral region, stage 1: Secondary | ICD-10-CM | POA: Diagnosis not present

## 2021-06-18 DIAGNOSIS — R4182 Altered mental status, unspecified: Secondary | ICD-10-CM | POA: Diagnosis not present

## 2021-06-18 DIAGNOSIS — I469 Cardiac arrest, cause unspecified: Secondary | ICD-10-CM | POA: Diagnosis not present

## 2021-06-18 DIAGNOSIS — E876 Hypokalemia: Secondary | ICD-10-CM | POA: Diagnosis not present

## 2021-06-18 DIAGNOSIS — I70222 Atherosclerosis of native arteries of extremities with rest pain, left leg: Secondary | ICD-10-CM | POA: Diagnosis not present

## 2021-06-18 DIAGNOSIS — Z79899 Other long term (current) drug therapy: Secondary | ICD-10-CM

## 2021-06-18 DIAGNOSIS — E785 Hyperlipidemia, unspecified: Secondary | ICD-10-CM | POA: Diagnosis not present

## 2021-06-18 DIAGNOSIS — E860 Dehydration: Secondary | ICD-10-CM | POA: Diagnosis present

## 2021-06-18 DIAGNOSIS — I2510 Atherosclerotic heart disease of native coronary artery without angina pectoris: Secondary | ICD-10-CM | POA: Diagnosis present

## 2021-06-18 DIAGNOSIS — Z951 Presence of aortocoronary bypass graft: Secondary | ICD-10-CM | POA: Diagnosis not present

## 2021-06-18 DIAGNOSIS — I951 Orthostatic hypotension: Secondary | ICD-10-CM | POA: Diagnosis present

## 2021-06-18 DIAGNOSIS — Z87891 Personal history of nicotine dependence: Secondary | ICD-10-CM

## 2021-06-18 DIAGNOSIS — N170 Acute kidney failure with tubular necrosis: Secondary | ICD-10-CM | POA: Diagnosis present

## 2021-06-18 DIAGNOSIS — Z4659 Encounter for fitting and adjustment of other gastrointestinal appliance and device: Secondary | ICD-10-CM

## 2021-06-18 DIAGNOSIS — I25810 Atherosclerosis of coronary artery bypass graft(s) without angina pectoris: Secondary | ICD-10-CM | POA: Diagnosis present

## 2021-06-18 DIAGNOSIS — Z880 Allergy status to penicillin: Secondary | ICD-10-CM

## 2021-06-18 DIAGNOSIS — I110 Hypertensive heart disease with heart failure: Secondary | ICD-10-CM | POA: Diagnosis present

## 2021-06-18 DIAGNOSIS — Z7902 Long term (current) use of antithrombotics/antiplatelets: Secondary | ICD-10-CM

## 2021-06-18 DIAGNOSIS — I739 Peripheral vascular disease, unspecified: Secondary | ICD-10-CM

## 2021-06-18 DIAGNOSIS — M7989 Other specified soft tissue disorders: Secondary | ICD-10-CM | POA: Diagnosis not present

## 2021-06-18 DIAGNOSIS — Z95810 Presence of automatic (implantable) cardiac defibrillator: Secondary | ICD-10-CM | POA: Diagnosis not present

## 2021-06-18 DIAGNOSIS — E119 Type 2 diabetes mellitus without complications: Secondary | ICD-10-CM

## 2021-06-18 DIAGNOSIS — W57XXXA Bitten or stung by nonvenomous insect and other nonvenomous arthropods, initial encounter: Secondary | ICD-10-CM | POA: Diagnosis present

## 2021-06-18 DIAGNOSIS — I219 Acute myocardial infarction, unspecified: Secondary | ICD-10-CM | POA: Diagnosis not present

## 2021-06-18 DIAGNOSIS — G9341 Metabolic encephalopathy: Secondary | ICD-10-CM | POA: Diagnosis present

## 2021-06-18 DIAGNOSIS — R404 Transient alteration of awareness: Secondary | ICD-10-CM | POA: Diagnosis not present

## 2021-06-18 DIAGNOSIS — J9602 Acute respiratory failure with hypercapnia: Secondary | ICD-10-CM | POA: Diagnosis not present

## 2021-06-18 DIAGNOSIS — Z978 Presence of other specified devices: Secondary | ICD-10-CM

## 2021-06-18 DIAGNOSIS — I462 Cardiac arrest due to underlying cardiac condition: Secondary | ICD-10-CM | POA: Diagnosis not present

## 2021-06-18 DIAGNOSIS — L8990 Pressure ulcer of unspecified site, unspecified stage: Secondary | ICD-10-CM | POA: Insufficient documentation

## 2021-06-18 DIAGNOSIS — Z86711 Personal history of pulmonary embolism: Secondary | ICD-10-CM

## 2021-06-18 DIAGNOSIS — F1010 Alcohol abuse, uncomplicated: Secondary | ICD-10-CM | POA: Diagnosis present

## 2021-06-18 DIAGNOSIS — E782 Mixed hyperlipidemia: Secondary | ICD-10-CM | POA: Diagnosis present

## 2021-06-18 DIAGNOSIS — Y848 Other medical procedures as the cause of abnormal reaction of the patient, or of later complication, without mention of misadventure at the time of the procedure: Secondary | ICD-10-CM | POA: Diagnosis present

## 2021-06-18 DIAGNOSIS — Y999 Unspecified external cause status: Secondary | ICD-10-CM | POA: Diagnosis not present

## 2021-06-18 DIAGNOSIS — I21A1 Myocardial infarction type 2: Secondary | ICD-10-CM | POA: Diagnosis not present

## 2021-06-18 DIAGNOSIS — T82855A Stenosis of coronary artery stent, initial encounter: Secondary | ICD-10-CM | POA: Diagnosis not present

## 2021-06-18 DIAGNOSIS — E1151 Type 2 diabetes mellitus with diabetic peripheral angiopathy without gangrene: Secondary | ICD-10-CM | POA: Diagnosis present

## 2021-06-18 DIAGNOSIS — Z885 Allergy status to narcotic agent status: Secondary | ICD-10-CM | POA: Diagnosis not present

## 2021-06-18 DIAGNOSIS — Z8249 Family history of ischemic heart disease and other diseases of the circulatory system: Secondary | ICD-10-CM

## 2021-06-18 DIAGNOSIS — M9689 Other intraoperative and postprocedural complications and disorders of the musculoskeletal system: Secondary | ICD-10-CM | POA: Diagnosis present

## 2021-06-18 DIAGNOSIS — I5022 Chronic systolic (congestive) heart failure: Secondary | ICD-10-CM | POA: Diagnosis present

## 2021-06-18 DIAGNOSIS — G9340 Encephalopathy, unspecified: Secondary | ICD-10-CM | POA: Diagnosis not present

## 2021-06-18 DIAGNOSIS — Z9911 Dependence on respirator [ventilator] status: Secondary | ICD-10-CM

## 2021-06-18 DIAGNOSIS — J9601 Acute respiratory failure with hypoxia: Secondary | ICD-10-CM | POA: Diagnosis not present

## 2021-06-18 DIAGNOSIS — K7460 Unspecified cirrhosis of liver: Secondary | ICD-10-CM | POA: Diagnosis present

## 2021-06-18 DIAGNOSIS — K219 Gastro-esophageal reflux disease without esophagitis: Secondary | ICD-10-CM | POA: Diagnosis not present

## 2021-06-18 DIAGNOSIS — J439 Emphysema, unspecified: Secondary | ICD-10-CM | POA: Diagnosis present

## 2021-06-18 DIAGNOSIS — R0603 Acute respiratory distress: Secondary | ICD-10-CM | POA: Diagnosis not present

## 2021-06-18 DIAGNOSIS — I700 Atherosclerosis of aorta: Secondary | ICD-10-CM | POA: Diagnosis not present

## 2021-06-18 DIAGNOSIS — I680 Cerebral amyloid angiopathy: Secondary | ICD-10-CM | POA: Diagnosis present

## 2021-06-18 DIAGNOSIS — R55 Syncope and collapse: Secondary | ICD-10-CM | POA: Diagnosis not present

## 2021-06-18 DIAGNOSIS — R4020 Unspecified coma: Secondary | ICD-10-CM | POA: Diagnosis not present

## 2021-06-18 DIAGNOSIS — I25118 Atherosclerotic heart disease of native coronary artery with other forms of angina pectoris: Secondary | ICD-10-CM | POA: Diagnosis not present

## 2021-06-18 DIAGNOSIS — R748 Abnormal levels of other serum enzymes: Secondary | ICD-10-CM

## 2021-06-18 DIAGNOSIS — Z955 Presence of coronary angioplasty implant and graft: Secondary | ICD-10-CM

## 2021-06-18 DIAGNOSIS — J9811 Atelectasis: Secondary | ICD-10-CM | POA: Diagnosis not present

## 2021-06-18 DIAGNOSIS — E854 Organ-limited amyloidosis: Secondary | ICD-10-CM | POA: Diagnosis present

## 2021-06-18 DIAGNOSIS — I25790 Atherosclerosis of other coronary artery bypass graft(s) with unstable angina pectoris: Secondary | ICD-10-CM | POA: Diagnosis not present

## 2021-06-18 DIAGNOSIS — R945 Abnormal results of liver function studies: Secondary | ICD-10-CM | POA: Diagnosis not present

## 2021-06-18 DIAGNOSIS — K760 Fatty (change of) liver, not elsewhere classified: Secondary | ICD-10-CM | POA: Diagnosis not present

## 2021-06-18 DIAGNOSIS — I252 Old myocardial infarction: Secondary | ICD-10-CM

## 2021-06-18 DIAGNOSIS — A419 Sepsis, unspecified organism: Principal | ICD-10-CM | POA: Diagnosis present

## 2021-06-18 DIAGNOSIS — Z7984 Long term (current) use of oral hypoglycemic drugs: Secondary | ICD-10-CM

## 2021-06-18 DIAGNOSIS — Z4682 Encounter for fitting and adjustment of non-vascular catheter: Secondary | ICD-10-CM | POA: Diagnosis not present

## 2021-06-18 DIAGNOSIS — S80861A Insect bite (nonvenomous), right lower leg, initial encounter: Secondary | ICD-10-CM | POA: Diagnosis not present

## 2021-06-18 DIAGNOSIS — Z886 Allergy status to analgesic agent status: Secondary | ICD-10-CM

## 2021-06-18 DIAGNOSIS — N183 Chronic kidney disease, stage 3 unspecified: Secondary | ICD-10-CM | POA: Diagnosis not present

## 2021-06-18 DIAGNOSIS — I5042 Chronic combined systolic (congestive) and diastolic (congestive) heart failure: Secondary | ICD-10-CM | POA: Diagnosis not present

## 2021-06-18 DIAGNOSIS — Z820 Family history of epilepsy and other diseases of the nervous system: Secondary | ICD-10-CM

## 2021-06-18 DIAGNOSIS — Z981 Arthrodesis status: Secondary | ICD-10-CM | POA: Diagnosis not present

## 2021-06-18 DIAGNOSIS — I517 Cardiomegaly: Secondary | ICD-10-CM | POA: Diagnosis not present

## 2021-06-18 LAB — COMPREHENSIVE METABOLIC PANEL
ALT: 77 U/L — ABNORMAL HIGH (ref 0–44)
AST: 99 U/L — ABNORMAL HIGH (ref 15–41)
Albumin: 4.1 g/dL (ref 3.5–5.0)
Alkaline Phosphatase: 68 U/L (ref 38–126)
Anion gap: 15 (ref 5–15)
BUN: 20 mg/dL (ref 8–23)
CO2: 29 mmol/L (ref 22–32)
Calcium: 9.5 mg/dL (ref 8.9–10.3)
Chloride: 91 mmol/L — ABNORMAL LOW (ref 98–111)
Creatinine, Ser: 1.71 mg/dL — ABNORMAL HIGH (ref 0.61–1.24)
GFR, Estimated: 43 mL/min — ABNORMAL LOW (ref >60)
Glucose, Bld: 240 mg/dL — ABNORMAL HIGH (ref 70–99)
Potassium: 2.2 mmol/L — CL (ref 3.5–5.1)
Sodium: 135 mmol/L (ref 135–145)
Total Bilirubin: 1.4 mg/dL — ABNORMAL HIGH (ref 0.3–1.2)
Total Protein: 8.4 g/dL — ABNORMAL HIGH (ref 6.5–8.1)

## 2021-06-18 LAB — CBC WITH DIFFERENTIAL/PLATELET
Abs Immature Granulocytes: 0.15 10*3/uL — ABNORMAL HIGH (ref 0.00–0.07)
Basophils Absolute: 0 10*3/uL (ref 0.0–0.1)
Basophils Relative: 0 %
Eosinophils Absolute: 0 10*3/uL (ref 0.0–0.5)
Eosinophils Relative: 0 %
HCT: 43.4 % (ref 39.0–52.0)
Hemoglobin: 14.5 g/dL (ref 13.0–17.0)
Immature Granulocytes: 1 %
Lymphocytes Relative: 19 %
Lymphs Abs: 2.2 10*3/uL (ref 0.7–4.0)
MCH: 30.7 pg (ref 26.0–34.0)
MCHC: 33.4 g/dL (ref 30.0–36.0)
MCV: 91.9 fL (ref 80.0–100.0)
Monocytes Absolute: 1.1 10*3/uL — ABNORMAL HIGH (ref 0.1–1.0)
Monocytes Relative: 9 %
Neutro Abs: 8.5 10*3/uL — ABNORMAL HIGH (ref 1.7–7.7)
Neutrophils Relative %: 71 %
Platelets: 201 10*3/uL (ref 150–400)
RBC: 4.72 MIL/uL (ref 4.22–5.81)
RDW: 17.6 % — ABNORMAL HIGH (ref 11.5–15.5)
WBC: 12 10*3/uL — ABNORMAL HIGH (ref 4.0–10.5)
nRBC: 0 % (ref 0.0–0.2)

## 2021-06-18 LAB — BLOOD GAS, ARTERIAL
Acid-Base Excess: 5.2 mmol/L — ABNORMAL HIGH (ref 0.0–2.0)
Bicarbonate: 26.3 mmol/L (ref 20.0–28.0)
FIO2: 0.6
MECHVT: 550 mL
Mechanical Rate: 14
O2 Saturation: 99.9 %
PEEP: 8 cmH2O
Patient temperature: 37
RATE: 14 resp/min
pCO2 arterial: 28 mmHg — ABNORMAL LOW (ref 32.0–48.0)
pH, Arterial: 7.58 — ABNORMAL HIGH (ref 7.350–7.450)
pO2, Arterial: 244 mmHg — ABNORMAL HIGH (ref 83.0–108.0)

## 2021-06-18 LAB — PROTIME-INR
INR: 1.6 — ABNORMAL HIGH (ref 0.8–1.2)
Prothrombin Time: 19.4 seconds — ABNORMAL HIGH (ref 11.4–15.2)

## 2021-06-18 LAB — URINALYSIS, COMPLETE (UACMP) WITH MICROSCOPIC
Bilirubin Urine: NEGATIVE
Glucose, UA: 100 mg/dL — AB
Ketones, ur: NEGATIVE mg/dL
Leukocytes,Ua: NEGATIVE
Nitrite: NEGATIVE
Protein, ur: 100 mg/dL — AB
Specific Gravity, Urine: 1.015 (ref 1.005–1.030)
Squamous Epithelial / HPF: NONE SEEN (ref 0–5)
pH: 8 (ref 5.0–8.0)

## 2021-06-18 LAB — CREATININE, SERUM
Creatinine, Ser: 1.64 mg/dL — ABNORMAL HIGH (ref 0.61–1.24)
GFR, Estimated: 45 mL/min — ABNORMAL LOW (ref >60)

## 2021-06-18 LAB — TROPONIN I (HIGH SENSITIVITY)
Troponin I (High Sensitivity): 52 ng/L — ABNORMAL HIGH (ref <18)
Troponin I (High Sensitivity): 701 ng/L (ref <18)
Troponin I (High Sensitivity): 1533 ng/L (ref <18)

## 2021-06-18 LAB — CBC
HCT: 42.6 % (ref 39.0–52.0)
Hemoglobin: 14.6 g/dL (ref 13.0–17.0)
MCH: 31.9 pg (ref 26.0–34.0)
MCHC: 34.3 g/dL (ref 30.0–36.0)
MCV: 93.2 fL (ref 80.0–100.0)
Platelets: 173 10*3/uL (ref 150–400)
RBC: 4.57 MIL/uL (ref 4.22–5.81)
RDW: 17.3 % — ABNORMAL HIGH (ref 11.5–15.5)
WBC: 10.6 10*3/uL — ABNORMAL HIGH (ref 4.0–10.5)
nRBC: 0 % (ref 0.0–0.2)

## 2021-06-18 LAB — RESP PANEL BY RT-PCR (FLU A&B, COVID) ARPGX2
Influenza A by PCR: NEGATIVE
Influenza B by PCR: NEGATIVE
SARS Coronavirus 2 by RT PCR: NEGATIVE

## 2021-06-18 LAB — MAGNESIUM: Magnesium: 1.8 mg/dL (ref 1.7–2.4)

## 2021-06-18 LAB — PROCALCITONIN: Procalcitonin: 0.31 ng/mL

## 2021-06-18 LAB — LACTIC ACID, PLASMA
Lactic Acid, Venous: 2.6 mmol/L (ref 0.5–1.9)
Lactic Acid, Venous: 2.1 mmol/L (ref 0.5–1.9)

## 2021-06-18 LAB — APTT: aPTT: 34 seconds (ref 24–36)

## 2021-06-18 LAB — HEPARIN LEVEL (UNFRACTIONATED): Heparin Unfractionated: >1.1 IU/mL — ABNORMAL HIGH (ref 0.30–0.70)

## 2021-06-18 LAB — GLUCOSE, CAPILLARY: Glucose-Capillary: 147 mg/dL — ABNORMAL HIGH (ref 70–99)

## 2021-06-18 MED ORDER — MIDAZOLAM HCL 2 MG/2ML IJ SOLN: 2.0000 mg | INTRAMUSCULAR | Status: DC | PRN

## 2021-06-18 MED ORDER — ACETAMINOPHEN 325 MG PO TABS: 650.0000 mg | ORAL_TABLET | ORAL | Status: DC

## 2021-06-18 MED ORDER — VANCOMYCIN HCL IN DEXTROSE 1-5 GM/200ML-% IV SOLN: 1000.0000 mg | Freq: Once | INTRAVENOUS | Status: DC

## 2021-06-18 MED ORDER — METRONIDAZOLE 500 MG/100ML IV SOLN
500.0000 mg | Freq: Once | INTRAVENOUS | Status: AC
  Administered 2021-06-18: 500 mg via INTRAVENOUS
  Filled 2021-06-18: qty 100

## 2021-06-18 MED ORDER — NOREPINEPHRINE 4 MG/250ML-% IV SOLN
2.0000 ug/min | INTRAVENOUS | Status: DC
Start: 2021-06-18 — End: 2021-06-23
  Administered 2021-06-18 – 2021-06-19 (×2): 2 ug/min via INTRAVENOUS
  Filled 2021-06-18 (×2): qty 250

## 2021-06-18 MED ORDER — ONDANSETRON HCL 4 MG/2ML IJ SOLN: 4.0000 mg | Freq: Four times a day (QID) | INTRAMUSCULAR | Status: DC | PRN

## 2021-06-18 MED ORDER — ETOMIDATE 2 MG/ML IV SOLN
20.0000 mg | Freq: Once | INTRAVENOUS | Status: AC
  Administered 2021-06-18: 20 mg via INTRAVENOUS

## 2021-06-18 MED ORDER — SODIUM CHLORIDE 0.9 % IV SOLN
500.0000 mg | INTRAVENOUS | Status: AC
  Administered 2021-06-19 – 2021-06-22 (×5): 500 mg via INTRAVENOUS
  Filled 2021-06-18 (×6): qty 5

## 2021-06-18 MED ORDER — ACETAMINOPHEN 650 MG RE SUPP
650.0000 mg | RECTAL | Status: DC
  Administered 2021-06-18: 650 mg via RECTAL
  Filled 2021-06-18: qty 1

## 2021-06-18 MED ORDER — INSULIN ASPART 100 UNIT/ML IJ SOLN
0.0000 [IU] | INTRAMUSCULAR | Status: DC
  Administered 2021-06-18: 3 [IU] via SUBCUTANEOUS
  Administered 2021-06-19 (×2): 2 [IU] via SUBCUTANEOUS
  Administered 2021-06-19: 3 [IU] via SUBCUTANEOUS
  Administered 2021-06-19: 2 [IU] via SUBCUTANEOUS
  Filled 2021-06-18 (×5): qty 1

## 2021-06-18 MED ORDER — VANCOMYCIN HCL 750 MG/150ML IV SOLN
750.0000 mg | INTRAVENOUS | Status: DC
  Filled 2021-06-18: qty 150

## 2021-06-18 MED ORDER — IOHEXOL 350 MG/ML SOLN
75.0000 mL | Freq: Once | INTRAVENOUS | Status: AC | PRN
  Administered 2021-06-18: 75 mL via INTRAVENOUS

## 2021-06-18 MED ORDER — CLONAZEPAM 0.125 MG PO TBDP
0.1250 mg | ORAL_TABLET | Freq: Three times a day (TID) | ORAL | Status: DC | PRN
  Filled 2021-06-18: qty 2

## 2021-06-18 MED ORDER — MIDAZOLAM HCL 2 MG/2ML IJ SOLN
1.0000 mg | INTRAMUSCULAR | Status: DC | PRN
  Filled 2021-06-18: qty 2

## 2021-06-18 MED ORDER — VANCOMYCIN HCL 1750 MG/350ML IV SOLN
1750.0000 mg | Freq: Once | INTRAVENOUS | Status: DC
  Filled 2021-06-18: qty 350

## 2021-06-18 MED ORDER — ROCURONIUM BROMIDE 50 MG/5ML IV SOLN
100.0000 mg | Freq: Once | INTRAVENOUS | Status: AC
  Administered 2021-06-18: 100 mg via INTRAVENOUS

## 2021-06-18 MED ORDER — FENTANYL 2500MCG IN NS 250ML (10MCG/ML) PREMIX INFUSION
25.0000 ug/h | INTRAVENOUS | Status: DC
  Administered 2021-06-18: 25 ug/h via INTRAVENOUS
  Filled 2021-06-18: qty 250

## 2021-06-18 MED ORDER — PANTOPRAZOLE SODIUM 40 MG IV SOLR
40.0000 mg | Freq: Every day | INTRAVENOUS | Status: DC
  Administered 2021-06-18 – 2021-06-25 (×8): 40 mg via INTRAVENOUS
  Filled 2021-06-18 (×8): qty 40

## 2021-06-18 MED ORDER — LACTATED RINGERS IV BOLUS
1000.0000 mL | Freq: Once | INTRAVENOUS | Status: AC
  Administered 2021-06-18: 1000 mL via INTRAVENOUS

## 2021-06-18 MED ORDER — CLONAZEPAM 0.5 MG PO TABS: 0.2500 mg | ORAL_TABLET | Freq: Three times a day (TID) | ORAL | Status: DC | PRN

## 2021-06-18 MED ORDER — HEPARIN SODIUM (PORCINE) 5000 UNIT/ML IJ SOLN: 5000.0000 [IU] | Freq: Three times a day (TID) | INTRAMUSCULAR | Status: DC

## 2021-06-18 MED ORDER — PROPOFOL 1000 MG/100ML IV EMUL
5.0000 ug/kg/min | INTRAVENOUS | Status: DC
  Administered 2021-06-18: 5 ug/kg/min via INTRAVENOUS
  Administered 2021-06-18 – 2021-06-19 (×2): 25 ug/kg/min via INTRAVENOUS
  Filled 2021-06-18 (×3): qty 100

## 2021-06-18 MED ORDER — LACTATED RINGERS IV SOLN: INTRAVENOUS | Status: AC

## 2021-06-18 MED ORDER — SODIUM CHLORIDE 0.9 % IV SOLN: 250.0000 mL | INTRAVENOUS | Status: DC

## 2021-06-18 MED ORDER — MIDAZOLAM HCL 2 MG/2ML IJ SOLN
2.0000 mg | Freq: Once | INTRAMUSCULAR | Status: AC
  Administered 2021-06-18: 2 mg via INTRAVENOUS
  Filled 2021-06-18: qty 2

## 2021-06-18 MED ORDER — HEPARIN (PORCINE) 25000 UT/250ML-% IV SOLN
1000.0000 [IU]/h | INTRAVENOUS | Status: DC
  Administered 2021-06-18: 1000 [IU]/h via INTRAVENOUS
  Filled 2021-06-18: qty 250

## 2021-06-18 MED ORDER — POTASSIUM CHLORIDE 10 MEQ/100ML IV SOLN
10.0000 meq | INTRAVENOUS | Status: AC
  Administered 2021-06-18: 10 meq via INTRAVENOUS
  Filled 2021-06-18 (×2): qty 100

## 2021-06-18 MED ORDER — MAGNESIUM SULFATE 2 GM/50ML IV SOLN
2.0000 g | Freq: Once | INTRAVENOUS | Status: AC
  Administered 2021-06-18: 2 g via INTRAVENOUS
  Filled 2021-06-18: qty 50

## 2021-06-18 MED ORDER — FENTANYL BOLUS VIA INFUSION
25.0000 ug | INTRAVENOUS | Status: DC | PRN
  Filled 2021-06-18: qty 100

## 2021-06-18 MED ORDER — POLYETHYLENE GLYCOL 3350 17 G PO PACK: 17.0000 g | PACK | Freq: Every day | ORAL | Status: DC

## 2021-06-18 MED ORDER — FAMOTIDINE 20 MG IN NS 100 ML IVPB: 20.0000 mg | INTRAVENOUS | Status: DC

## 2021-06-18 MED ORDER — DOCUSATE SODIUM 50 MG/5ML PO LIQD
100.0000 mg | Freq: Two times a day (BID) | ORAL | Status: DC
  Administered 2021-06-18: 100 mg
  Filled 2021-06-18 (×2): qty 10

## 2021-06-18 MED ORDER — FOLIC ACID 1 MG PO TABS: 1.0000 mg | ORAL_TABLET | Freq: Every day | ORAL | Status: DC

## 2021-06-18 MED ORDER — FENTANYL CITRATE PF 50 MCG/ML IJ SOSY
25.0000 ug | PREFILLED_SYRINGE | Freq: Once | INTRAMUSCULAR | Status: AC
  Administered 2021-06-18: 25 ug via INTRAVENOUS
  Filled 2021-06-18: qty 1

## 2021-06-18 MED ORDER — ADULT MULTIVITAMIN W/MINERALS CH: 1.0000 | ORAL_TABLET | Freq: Every day | ORAL | Status: DC

## 2021-06-18 MED ORDER — HEPARIN BOLUS VIA INFUSION
4000.0000 [IU] | Freq: Once | INTRAVENOUS | Status: AC
  Administered 2021-06-18: 4000 [IU] via INTRAVENOUS
  Filled 2021-06-18: qty 4000

## 2021-06-18 MED ORDER — POTASSIUM CHLORIDE 10 MEQ/100ML IV SOLN
10.0000 meq | Freq: Once | INTRAVENOUS | Status: AC
  Administered 2021-06-18: 10 meq via INTRAVENOUS
  Filled 2021-06-18: qty 100

## 2021-06-18 MED ORDER — ACETAMINOPHEN 160 MG/5ML PO SOLN
650.0000 mg | ORAL | Status: DC
  Administered 2021-06-18 – 2021-06-19 (×2): 650 mg
  Filled 2021-06-18 (×7): qty 20.3

## 2021-06-18 MED ORDER — AZTREONAM 2 G IJ SOLR
2.0000 g | Freq: Once | INTRAMUSCULAR | Status: AC
Start: 2021-06-18 — End: 2021-06-18
  Administered 2021-06-18: 2 g via INTRAVENOUS
  Filled 2021-06-18: qty 2

## 2021-06-18 MED ORDER — SODIUM CHLORIDE 0.9 % IV SOLN
100.0000 mg | Freq: Two times a day (BID) | INTRAVENOUS | Status: DC
  Administered 2021-06-19 (×2): 100 mg via INTRAVENOUS
  Filled 2021-06-18 (×6): qty 100

## 2021-06-18 MED ORDER — THIAMINE HCL 100 MG/ML IJ SOLN
100.0000 mg | Freq: Every day | INTRAMUSCULAR | Status: DC
  Administered 2021-06-23 – 2021-06-24 (×2): 100 mg via INTRAVENOUS
  Filled 2021-06-18 (×2): qty 2

## 2021-06-18 NOTE — H&P (Signed)
NAME:  Damon English, MRN:  [id number], DOB:  11-Aug-1951, LOS: 0 ADMISSION DATE:  06/18/2021, CONSULTATION DATE: 06/18/2021 REFERRING MD: Dr. Kerman Passey, CHIEF COMPLAINT: Cardiac Arrest   History of Present Illness:  This is a 70 yo male presented to [facility] ER on 09/2 following a cardiac arrest.  Per ER notes pts wife reported while the pt was leaving his outpatient doctors office he felt like he was going to pass out.  He sat on his walker and lost consciousness.  Pts wife reported he briefly regained consciousness several times EMS notified.  When EMS arrived pt unresponsive and had a pulse, but required defibrillation x2.  En route to the ER he required bag mask ventilation.    ED course Upon arrival to the ER pt remained minimally responsive (opening eyes and moaning), however unable to follow commands.  Pt mechanically intubated per ED provider.  Pt hypertensive, however EKG x2 revealed complete heart block hr 50's and no concerning ST changes.  Cardiologist Dr. Fletcher Anon contacted by ER physician and given pts initial heart block rate remain in the 50's and pt remained hypertensive there was no indication for cardiac intervention, recommended continued medical management.  CT Head negative for acute abnormality and CTA Chest negative for PE, however concerning for early cirrhosis.  Lab results revealed K+ 2.2, chloride 91, glucose 240, creatinine 1.71, AST 99, ALT 77, troponin 52, wbc 12.0, and Respiratory panel by RT-PCR negative.  Pt also febrile temp 100/1~100.5 degrees F with right lower extremity warm to touch along with 2+ edema.  He received azactam, vancomycin, and doxycycline.   According to pts wife they went camping over the weekend and pt reported a right flank unknown insect bite.  Following camping trip on 08/31 pts wife stated he began having fevers and right lower extremity pain.  He saw his outpatient vascular physician on 09/2 for evaluation of symptoms and all test came back negative.     PCCM team contacted by ER physician for ICU admission and normothermia protocol initiated.   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 Chronic microvascular ischemic change in the white matter. CTA Chest 09/2>>No evidence of acute pulmonary embolism or other acute chest process. Diffuse coronary and Aortic Atherosclerosis (ICD10-I70.0). Previous CABG. Mild emphysema with probable mild dependent atelectasis in both lungs. Potential changes of early cirrhosis CT Femur Right 09/2>> CT Foot Right 09/2>> CT Tibia Fibular Right 09/2>>  Micro Data:  Respiratory panel by RT-PCR 09/2>>negative  Blood x2 09/2>> Urine 09/2>> Lyme disease serology w/reflex 09/2>> [place] spotted fvr abs pnl (IgG+IgM) 09/2>>  Antimicrobials:  Azactam 09/2 x1 dose Doxycycline 09/2>> Vancomycin 09/2>>  Interim History / Subjective:  Pt remains mechanically intubated awake/agitated despite propofol gtt and not following commands.    Objective   Blood pressure (!) 155/72, pulse (!) 105, temperature (!) 100.5 F (38.1 C), resp. rate Marland Kitchen)  23, height '5\' 6"'  (1.676 m), weight 93.2 kg, SpO2 100 %.    Vent Mode: AC FiO2 (%):  [100 %] 100 % Set Rate:  [12 bmp-14 bmp] 12 bmp Vt Set:  [500 mL-550 mL] 500 mL PEEP:  [8 cmH20] 8 cmH20  No intake or  output data in the 24 hours ending 06/18/21 1739 Filed Weights   06/18/21 1507  Weight: 93.2 kg    Examination: General: acutely ill appearing male, agitated mechanically intubated  HENT: supple, no JVD  Lungs: faint rhonchi throughout, even, non labored  Cardiovascular: sinus tach, no R/G, 2+ radial/1+ distal pulses, 2+ RLE edema  Abdomen: +BS x4, soft, non distended, right flank ecchymotic area  Extremities: moves all extremities, RLE 2+ edema/erythema/warmth Neuro: sedated, agitated, not following commands, PERRL  GU: indwelling catheter in place draining yellow urine   Resolved Hospital Problem list     Assessment & Plan:  Acute hypoxic respiratory failure post cardiac arrest  Mechanical intubation  Hx: Tobacco Abuse  - Continue ventilator support & lung protective strategies - Wean PEEP & FiO2 as tolerated, maintain SpO2 > 90% - Head of bed elevated 30 degrees, VAP protocol in place - Plateau pressures less than 30 cm H20  - Intermittent chest x-ray & ABG PRN - Daily WUA with SBT as tolerated  - Ensure adequate pulmonary hygiene  - PAD protocol in place: continue Fentanyl and propofol gtts  - Normothermic protocol initiated   Cardiac arrest requiring defibrillation suspected secondary to electrolyte imbalance Elevated troponin likely demand ischemia: 52~ Complete heart block~resolved  Intermittent HTN  Hx: 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, Chronic Systolic CHF EF 63%, Orthostatic Hypotension, 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, VT Arrest (discharged with a life vest), Mixed HLD, and PAD s/p Right Leg Bypass  - Continuous telemetry monitoring  - Trend troponin's  - Maintain map >65 - Hold outpatient antihypertensives for now  - Cardiology contacted by ER physician who recommended no indication for intervention for complete heart block continue medication management  - Echo pending   Acute kidney injury secondary to ATN  Hypokalemia  - Trend BMP; Magnesium and phosphorous levels  pending - Replace electrolytes as indicated - Monitor UOP - Avoid nephrotoxic medications   Sepsis suspected secondary to tick-borne illness vs. right lower extremity cellulitis  - Trend WBC and monitor fever curve  - Trend PCT  - Follow microbiology data - Continue abx therapy as outlined above  - CT right femur/tibia~fibula/foot pending   Transaminitis~CT Chest 09/2 concerning for early cirrhosis  - Trend hepatic function panel  - Hold outpatient atorvastatin for now  - [place] RUQ pending   Type II diabetes mellitus  - Hemoglobin A1c pending  - CBG's q4hrs  - SSI   Acute encephalopathy concerning for anoxic brain injury post cardiac arrest~CT Head 09/2 negative for acute abnormality  - EEG pending  - WUA daily once normothermic protocol complete  - If mentation does not improve following normothermic protocol will obtain MR Brain and consult neurology   Best Practice (right click and "Reselect all SmartList Selections" daily)   Diet/type: NPO DVT prophylaxis: prophylactic heparin  GI prophylaxis: H2B Lines: N/A Foley:  Yes, and it is still needed Code Status:  full code Last date of multidisciplinary goals of care discussion [N/A]  Pts wife at bedside and updated regarding current plan of care and all questions were answered.   Labs   CBC: Recent Labs  Lab 06/18/21 1513  WBC 12.0*  NEUTROABS 8.5*  HGB  14.5  HCT 43.4  MCV 91.9  PLT 619    Basic Metabolic Panel: Recent Labs  Lab 06/18/21 1513  NA 135  K 2.2*  CL 91*  CO2 29  GLUCOSE 240*  BUN 20  CREATININE 1.71*  CALCIUM 9.5   GFR: Estimated Creatinine Clearance: 44.2 mL/min (A) (by C-G formula based on SCr of 1.71 mg/dL (H)). Recent Labs  Lab 06/18/21 1513  WBC 12.0*    Liver Function Tests: Recent Labs  Lab 06/18/21 1513  AST 99*  ALT 77*  ALKPHOS 68  BILITOT 1.4*  PROT 8.4*  ALBUMIN 4.1   No results for input(s): LIPASE, AMYLASE in the last 168 hours. No results for input(s):  AMMONIA in the last 168 hours.  ABG    Component Value Date/Time   PHART 7.58 (H) 06/18/2021 1512   PCO2ART 28 (L) 06/18/2021 1512   PO2ART 244 (H) 06/18/2021 1512   HCO3 26.3 06/18/2021 1512   O2SAT 99.9 06/18/2021 1512     Coagulation Profile: No results for input(s): INR, PROTIME in the last 168 hours.  Cardiac Enzymes: No results for input(s): CKTOTAL, CKMB, CKMBINDEX, TROPONINI in the last 168 hours.  HbA1C: No results found for: HGBA1C  CBG: No results for input(s): GLUCAP in the last 168 hours.  Review of Systems:   Unable to assess pt mechanically intubated  Past Medical History:  He,  has a past medical history of Alcohol use, Diabetes mellitus without complication ([place]), History of tobacco use, Hypertension, Pulmonary embolism ([place]) (2012), and PVD (peripheral vascular disease) ([place]).   Surgical History:   Past Surgical History:  Procedure Laterality Date   BACK SURGERY     CHOLECYSTECTOMY     CORONARY ANGIOGRAPHY N/A 01/30/2019   Procedure: CORONARY ANGIOGRAPHY;  Surgeon: Corey Skains, MD;  Location: [facility] [facility];  Service: Cardiovascular;  Laterality: N/A;   ERCP W/ SPHICTEROTOMY  2015   FEMORAL ARTERY - POPLITEAL ARTERY BYPASS GRAFT Right 06/19/2018   LOWER EXTREMITY ANGIOGRAPHY Left 01/14/2021   Procedure: LOWER EXTREMITY ANGIOGRAPHY;  Surgeon: Algernon Huxley, MD;  Location: [place] [facility];  Service: Cardiovascular;  Laterality: Left;   THROMBOENDARTERECTOMY Right 06/19/2018   TONSILLECTOMY     VASCULAR SURGERY       Social History:   reports that he quit smoking about 10 years ago. His smoking use included cigarettes. He has a 80.00 pack-year smoking history. He has never used smokeless tobacco. He reports current alcohol use of about 7.0 standard drinks per week. He reports that he does not use drugs.   Family History:  His family history includes AAA (abdominal aortic aneurysm) in his father; Alzheimer's disease in his father; Heart  attack in his father; Hypertension in his mother; Parkinson's disease in his mother; Vascular Disease in his father.   Allergies Allergies  Allergen Reactions   Amoxicillin Other (See Comments)    Abdominal cramps Other reaction(s): Other (See Comments) Abdominal cramps Tolerated ancef 7/16   Hydromorphone Hcl     Quick & severe nausea/vomiting  Other reaction(s): Vomiting Quick & severe nausea/vomiting    Aspirin     Other reaction(s): Other (See Comments) He has likely cerebral amyloid.  Is on NOAC for now.  No ASA with it. Not allergic per patient.      Home Medications  Prior to Admission medications   Medication Sig Start Date End Date Taking? Authorizing Provider  apixaban (ELIQUIS) 5 MG TABS tablet Take by mouth. 06/21/18   [provider]  atorvastatin (LIPITOR) 80 MG tablet Take 80 mg by mouth daily.    [provider]  Blood Glucose Monitoring Suppl (ONE TOUCH ULTRA 2) w/Device KIT See admin instructions. 02/25/16   [provider]  brimonidine (ALPHAGAN) 0.2 % ophthalmic solution  12/31/20   [provider]  Calcipotriene-Betameth Diprop (ENSTILAR) 0.005-0.064 % FOAM APPLY THIN LAYER TO AFFECTED AREAS ONCE DAILY UNTIL FLAT AND ASYMPTOMATIC, MAX 4 WEEKS 08/12/19   [provider]  cephALEXin (KEFLEX) 500 MG capsule Take 500 mg by mouth 3 (three) times daily. Patient not taking: No sig reported    [provider]  clonazePAM (KLONOPIN) 0.5 MG tablet Take 0.25-0.5 mg by mouth 3 (three) times daily as needed for anxiety.    [provider]  clopidogrel (PLAVIX) 75 MG tablet Take 75 mg by mouth daily. 05/08/19   [provider]  DULoxetine (CYMBALTA) 60 MG capsule Take 1 capsule (60 mg total) by mouth 2 (two) times daily. 01/30/19   Vaughan Basta, MD  esomeprazole (NEXIUM) 40 MG capsule Take 1 capsule by mouth daily. 12/03/18   [provider]  famotidine (PEPCID) 40 MG tablet Take 1 tablet by  mouth every morning. 01/21/19   [provider]  furosemide (LASIX) 20 MG tablet Take 40 mg by mouth daily.    [provider]  glipiZIDE (GLUCOTROL XL) 10 MG 24 hr tablet Take 10 mg by mouth every morning. 01/11/21   [provider]  HYDROcodone-acetaminophen (NORCO/VICODIN) 5-325 MG tablet Take 0.5-1 tablets by mouth 2 (two) times daily as needed for pain. 01/22/19   [provider]  isosorbide mononitrate (IMDUR) 30 MG 24 hr tablet Take 30 mg by mouth daily.    [provider]  ketoconazole (NIZORAL) 2 % shampoo Apply topically. 09/18/20   [provider]  Latanoprost 0.005 % EMUL Apply to eye. 07/17/17   [provider]  Magnesium 250 MG TABS Take by mouth. Patient not taking: No sig reported    [provider]  metoprolol succinate (TOPROL-XL) 50 MG 24 hr tablet Take 50 mg by mouth 2 (two) times a day. Take with or immediately following a meal.    [provider]  midodrine (PROAMATINE) 2.5 MG tablet Take 2.5 mg by mouth daily.    [provider]  nitroGLYCERIN (NITROSTAT) 0.4 MG SL tablet Place 0.4 mg under the tongue every 5 (five) minutes as needed for chest pain.    [provider]  omeprazole (PRILOSEC) 40 MG capsule Take 40 mg by mouth daily.    [provider]  [facility] ULTRA test strip 2 (two) times daily. 12/19/20   [provider]  ramipril (ALTACE) 2.5 MG capsule Take by mouth. 03/14/19 03/13/20  [provider]  ranolazine (RANEXA) 500 MG 12 hr tablet Take 500 mg by mouth 2 (two) times daily. 05/08/19   [provider]  SENNOSIDES PO Take by mouth.    [provider]  spironolactone (ALDACTONE) 25 MG tablet Take 25 mg by mouth. 02/12/19 02/12/20  [provider]  sucralfate (CARAFATE) 1 g tablet Take 1 tablet by mouth 4 (four) times daily -  before meals and at bedtime. Patient not taking: No sig reported 01/23/19   [provider]      Critical care time: 1 hour     Rosilyn Mings, [place] Pager [phone] (please enter 7 digits) PCCM Consult Pager [phone] (please enter 7 digits)

## 2021-06-18 NOTE — ED Notes (Signed)
Informed RN bed assigned 

## 2021-06-18 NOTE — Progress Notes (Signed)
Spoke with wife, Hassan Rowan and daughter, Claiborne Billings updating both about patient's current status. Hassan Rowan and [place] both confirm that patient drinks 0.5 gallon of bourbon over 4 nights. Wife stating he binge drinks hard liquor nightly for 4 hours before bed, and recently started drinking 1-2 beers during the day. She states that in the past when he has stopped drinking he will get hand shakes but has not had any other symptoms. Hassan Rowan reports last drink 06/17/21 in the evening. All questions answered at this time.  - added versed IV PRN to propofol & fentanyl drips - high dose thiamine, multi-vitamin & folic acid added - Echo already ordered - cardiac enzymes continue to rise > will trend to peak, heparin drip added   Venetia Night, AGACNP-BC Acute Care Nurse Practitioner [place]   [phone] / [phone] Please see Amion for pager details.

## 2021-06-18 NOTE — Progress Notes (Signed)
Pharmacy Antibiotic Note  Damon English is a 69 y.o. male admitted on 06/18/2021 with sepsis.  Pharmacy has been consulted for Vancomycin dosing.  Plan: Vancomycin 1750 mg IV loading dose followed by Vancomycin 750 mg IV Q 24 hrs. Goal AUC 400-550. Expected AUC: 455.1 SCr used: 1.71 Expected Cmin: 12.5 mcg/ml   Height: '5\' 6"'$  (167.6 cm) Weight: 93.2 kg (205 lb 7.5 oz) IBW/kg (Calculated) : 63.8  Temp (24hrs), Avg:100.4 F (38 C), Min:100 F (37.8 C), Max:100.6 F (38.1 C)  Recent Labs  Lab 06/18/21 1513 06/18/21 1731  WBC 12.0*  --   CREATININE 1.71*  --   LATICACIDVEN  --  2.6*    Estimated Creatinine Clearance: 44.2 mL/min (A) (by C-G formula based on SCr of 1.71 mg/dL (H)).    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  Is on NOAC for now.  No ASA with it. Not allergic per patient.     Antimicrobials this admission: Doxycycline 9/2 >>  Vancomycin 9/2 >>   Dose adjustments this admission:  Microbiology results:   Thank you for allowing pharmacy to be a part of this patient's care.  Paulina Fusi, PharmD, BCPS 06/18/2021 8:26 PM

## 2021-06-18 NOTE — Progress Notes (Signed)
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  Is on NOAC for now.  No ASA with it. Not allergic per patient.     Patient Measurements: Height: '5\' 5"'$  (165.1 cm) Weight: 97.2 kg (214 lb 4.6 oz) IBW/kg (Calculated) : 61.5 Heparin Dosing Weight: 83.8 kg  Vital Signs: Temp: 98.8 F (37.1 C) (09/02 2200) Temp Source: Bladder (09/02 2200) BP: 115/59 (09/02 2200) Pulse Rate: 66 (09/02 2200)  Labs: Recent Labs    06/18/21 1513 06/18/21 1731 06/18/21 2100 06/18/21 2128  HGB 14.5  --  14.6  --   HCT 43.4  --  42.6  --   PLT 201  --  173  --   APTT  --  34  --   --   LABPROT  --  19.4*  --   --   INR  --  1.6*  --   --   HEPARINUNFRC  --   --   --  >1.10*  CREATININE 1.71*  --  1.64*  --   TROPONINIHS 52* 701* 1,533*  --     Estimated Creatinine Clearance: 46.2 mL/min (A) (by C-G formula based on SCr of 1.64 mg/dL (H)).   Medical History: Past Medical History:  Diagnosis Date   Alcohol use    "cutting back" but still heavy and daily, multiple shots of bourbon each night   Diabetes mellitus without complication ([place])    History of tobacco use    reportedly quit in 2013   Hypertension    Pulmonary embolism ([place]) 2012   unprovoked   PVD (peripheral vascular disease) ([place])    Assessment: Patient is a 70yo male admitted following cardiac arrest.Pharmacy consulted for Heparin dosing for ACS. Patient was taking apixaban prior to admission for history of DVT/PE, his last dose was the morning of 9/2.  Baseline HL is > 1.10, CBC is WNL  Goal of  Therapy:  Heparin level 0.3-0.7 units/ml aPTT 66 - 102 seconds Monitor platelets by anticoagulation protocol: Yes   Plan:  Heparin 4000 units IV bolus Heparin 1000 units/hr infusion aPTT in 6 hours Daily CBC while on Heparin drip Will use aPTT to guide therapy until HL and aPTT correlate  Paulina Fusi, PharmD, BCPS 06/18/2021 10:28 PM

## 2021-06-18 NOTE — ED Notes (Signed)
Pt to CT

## 2021-06-18 NOTE — Consult Note (Signed)
PHARMACY CONSULT NOTE - FOLLOW UP  Pharmacy Consult for Electrolyte Monitoring and Replacement   Recent Labs: Potassium (mmol/L)  Date Value  06/18/2021 2.2 (LL)  11/08/2013 3.4 (L)   Magnesium (mg/dL)  Date Value  01/28/2019 1.7   Calcium (mg/dL)  Date Value  06/18/2021 9.5   Calcium, Total (mg/dL)  Date Value  11/08/2013 9.3   Albumin (g/dL)  Date Value  06/18/2021 4.1   Sodium (mmol/L)  Date Value  06/18/2021 135  11/08/2013 136   Assessment: Patient arrived with minimal responsiveness. Required defibrillation x 2, CODE sepsis activated and critical care consulted. Pharmacy consulted for electrolytes management. Mg sulfate 2gm and Kcl 8mq orderd by MD  Goal of Therapy:  Electrolytes within normal limits (keep Mg >/= 2 per MD orders  Plan:  - No additional Mg replacement needed at this time - Potassium needs 3 more doses of 179m Kcl - Will follow up and replace as needed  Cass Edinger Rodriguez-Guzman PharmD, BCPS 06/18/2021 6:31 PM
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
## 2021-06-18 NOTE — Progress Notes (Signed)
Subjective:    Patient ID: Damon English, male    DOB: 1950-10-28, 69 y.o.   MRN: [id number] Chief Complaint  Patient presents with   Follow-up    Ultrasound follow up    Damon English is a 70 year old male that presents today for follow-up evaluation of his peripheral arterial disease in addition to right leg swelling.  The patient notes that recently he began to have severe pain in his right foot.  He notes that he can barely apply pressure to the leg and it is very difficult to walk.  Per the patient's wife this is been ongoing for some time however the pain will come and go without too much issue.  The patient notes that in this case the pain has been consistent for the last several days.  There are some pain in the left foot but not nearly as bad.  The patient also has some swelling in this area but this is also not uncommon for the patient due to his history of lymphedema after bypass graft.  The patient's wife notes that she wraps his leg regularly although not consistently.  She notes that when she does the wraps the swelling is decreased but when there are no wraps the swelling increases.  The patient also notes that he had some recent low-grade fever but last night and some mildly chills.  There are no wounds or ulcerations.  Today noninvasive study showed ABI of 1.28 on the right with a TBI 0.79.  The left has an ABI of 0.79 with a TBI of 0.39.  The previous ABI done on 01/2021, shows an ABI of 1.03 on the right and 0.62 on the left.  The patient has good biphasic waveforms on the right with good toe waveforms and monophasic waveforms on the left slightly dampened toe waveforms.  Limited lower extremity arterial duplex shows the right external iliac artery is patent.  The left external iliac in-stent as well as common femoral arteries are also patent.  Limited venous duplex shows no evidence of DVT within the tibial veins or popliteal of the right lower extremity.  Prior to the patient  leaving our office he underwent cardiac arrest.  Details are noted below.   Review of Systems  Cardiovascular:  Positive for leg swelling.  Musculoskeletal:  Positive for arthralgias.  Neurological:  Positive for weakness.  All other systems reviewed and are negative.     Objective:   Physical Exam Vitals reviewed.  HENT:     Head: Normocephalic.  Cardiovascular:     Rate and Rhythm: Normal rate.     Pulses:          Posterior tibial pulses are 1+ on the right side.  Pulmonary:     Effort: Pulmonary effort is normal.  Musculoskeletal:     Right lower leg: 2+ Edema present.  Skin:    General: Skin is warm and dry.  Neurological:     Mental Status: He is alert and oriented to person, place, and time.  Psychiatric:        Mood and Affect: Mood normal.        Behavior: Behavior normal.        Thought Content: Thought content normal.        Judgment: Judgment normal.    BP 138/62 (BP Location: Right Arm)   Pulse (!) 51   Resp 16   Past Medical History:  Diagnosis Date   Alcohol use    "cutting back"  but still heavy and daily, multiple shots of bourbon each night   Diabetes mellitus without complication ([place])    History of tobacco use    reportedly quit in 2013   Hypertension    Pulmonary embolism ([place]) 2012   unprovoked   PVD (peripheral vascular disease) ([place])     Social History   Socioeconomic History   Marital status: Married    Spouse name: Not on file   Number of children: Not on file   Years of education: Not on file   Highest education level: Not on file  Occupational History   Not on file  Tobacco Use   Smoking status: Former    Packs/day: 2.00    Years: 40.00    Pack years: 80.00    Types: Cigarettes    Quit date: 11/27/2010    Years since quitting: 10.5   Smokeless tobacco: Never   Tobacco comments:    Quit in 2012  Substance and Sexual Activity   Alcohol use: Yes    Alcohol/week: 7.0 standard drinks    Types: 7 Shots of liquor per week    Drug use: Never   Sexual activity: Not on file  Other Topics Concern   Not on file  Social History Narrative   Not on file   Social Determinants of Health   Financial Resource Strain: Not on file  Food Insecurity: Not on file  Transportation Needs: Not on file  Physical Activity: Not on file  Stress: Not on file  Social Connections: Not on file  Intimate Partner Violence: Not on file    Past Surgical History:  Procedure Laterality Date   BACK SURGERY     CHOLECYSTECTOMY     CORONARY ANGIOGRAPHY N/A 01/30/2019   Procedure: CORONARY ANGIOGRAPHY;  Surgeon: Corey Skains, MD;  Location: [facility];  Service: Cardiovascular;  Laterality: N/A;   ERCP W/ SPHICTEROTOMY  2015   FEMORAL ARTERY - POPLITEAL ARTERY BYPASS GRAFT Right 06/19/2018   LOWER EXTREMITY ANGIOGRAPHY Left 01/14/2021   Procedure: LOWER EXTREMITY ANGIOGRAPHY;  Surgeon: Algernon Huxley, MD;  Location: [facility];  Service: Cardiovascular;  Laterality: Left;   THROMBOENDARTERECTOMY Right 06/19/2018   TONSILLECTOMY     VASCULAR SURGERY      Family History  Problem Relation Age of Onset   Hypertension Mother    Parkinson's disease Mother    Heart attack Father    Vascular Disease Father    AAA (abdominal aortic aneurysm) Father    Alzheimer's disease Father     Allergies  Allergen Reactions   Amoxicillin Other (See Comments)    Abdominal cramps Other reaction(s): Other (See Comments) Abdominal cramps Tolerated ancef 7/16   Hydromorphone Hcl     Quick & severe nausea/vomiting  Other reaction(s): Vomiting Quick & severe nausea/vomiting    Aspirin     Other reaction(s): Other (See Comments) 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  Is on NOAC for now.  No ASA with it. Not allergic per patient.     CBC Latest Ref Rng & Units 06/18/2021 01/30/2019 01/29/2019  WBC 4.0 - 10.5 K/uL 12.0(H) 7.2 8.1  Hemoglobin 13.0 - 17.0 g/dL 14.5 14.0 13.8  Hematocrit 39.0 - 52.0 % 43.4 41.1 40.5  Platelets 150 -  400 K/uL 201 133(L) 141(L)      CMP     Component Value Date/Time   NA 135 06/18/2021 1513   NA 136 11/08/2013 1530   K 2.2 (LL) 06/18/2021 1513  K 3.4 (L) 11/08/2013 1530   CL 91 (L) 06/18/2021 1513   CL 102 11/08/2013 1530   CO2 29 06/18/2021 1513   CO2 27 11/08/2013 1530   GLUCOSE 240 (H) 06/18/2021 1513   GLUCOSE 150 (H) 11/08/2013 1530   BUN 20 06/18/2021 1513   BUN 16 11/08/2013 1530   CREATININE 1.71 (H) 06/18/2021 1513   CREATININE 1.07 11/08/2013 1530   CALCIUM 9.5 06/18/2021 1513   CALCIUM 9.3 11/08/2013 1530   PROT 8.4 (H) 06/18/2021 1513   ALBUMIN 4.1 06/18/2021 1513   AST 99 (H) 06/18/2021 1513   ALT 77 (H) 06/18/2021 1513   ALKPHOS 68 06/18/2021 1513   BILITOT 1.4 (H) 06/18/2021 1513   GFRNONAA 43 (L) 06/18/2021 1513   GFRNONAA >60 11/08/2013 1530   GFRAA >60 01/30/2019 0652   GFRAA >60 11/08/2013 1530     No results found.     Assessment & Plan:   1. Atherosclerosis of native artery of left lower extremity with rest pain [facility]) The patient is having significant pain in his right lower extremity.  Noninvasive studies today show no evidence of acute ischemic event.  Right ABIs are within normal limits and the left is stable.  Patient has upcoming appointment with PCP for further evaluation.  Based on description may be related to gout or plantar fasciitis.  we will keep a close eye on the patient and have her return in 3 months with noninvasive studies or sooner if issues arise.  We also discussed possible intervention on left lower extremity due to moderate PAD symptoms. 2. Swelling of limb Patient has known lymphedema following his bypass surgery done at [facility] years ago in his right lower extremity.  Selective imaging today shows no evidence of thrombus within the tibial veins or popliteal, flow does not indicate more proximal occlusion.  Patient advised to continue to utilize compression and elevation for treatment of edema.  3. Cardiac arrest [facility]) As  the patient was leaving the office following his visit, his wife alerted [place] that he was losing consciousness.  She noted that he felt as if he was going to blackout before he did not lose consciousness.  Once I arrived by his side the patient was nonresponsive.  A sternal rub was done and the patient regained consciousness, asking what several times before losing consciousness once again.  The patient began agonal breathing.  At that time the patient was sitting on his walker.  The patient was then lowered to the ground by myself and several other staff members.  Carotid pulse was checked and was absent despite abdominal breathing.  Response was again checked and he was not responsive to any stimuli.  CPR was started by myself.  AED was applied after 2 rounds of CPR.  After initial placement the AED advised shock.  1 shock was delivered.  CPR was resumed for 1 rounds until the AED began to analyze.  It then advised for a second shock which was delivered.  CPR was immediately resumed.  At that time EMS arrived and took over chest compressions.  Prior to being transported, the patient had ROSC with palpable femoral pulses.  The patient continued to agonal he breathing so he assisted with breathing by EMS.  At that time he was taken by ambulance to [facility].   No current facility-administered medications on file prior to visit.   Current Outpatient Medications on File Prior to Visit  Medication Sig Dispense Refill   apixaban (  ELIQUIS) 5 MG TABS tablet Take by mouth.     atorvastatin (LIPITOR) 80 MG tablet Take 80 mg by mouth daily.     Blood Glucose Monitoring Suppl (ONE TOUCH ULTRA 2) w/Device KIT See admin instructions.     Calcipotriene-Betameth Diprop (ENSTILAR) 0.005-0.064 % FOAM APPLY THIN LAYER TO AFFECTED AREAS ONCE DAILY UNTIL FLAT AND ASYMPTOMATIC, MAX 4 WEEKS     clonazePAM (KLONOPIN) 0.5 MG tablet Take 0.25-0.5 mg by mouth 3 (three) times daily as needed for anxiety.      clopidogrel (PLAVIX) 75 MG tablet Take 75 mg by mouth daily.     DULoxetine (CYMBALTA) 60 MG capsule Take 1 capsule (60 mg total) by mouth 2 (two) times daily. 20 capsule 0   esomeprazole (NEXIUM) 40 MG capsule Take 1 capsule by mouth daily.     famotidine (PEPCID) 40 MG tablet Take 1 tablet by mouth every morning.     furosemide (LASIX) 20 MG tablet Take 40 mg by mouth daily.     glipiZIDE (GLUCOTROL XL) 10 MG 24 hr tablet Take 10 mg by mouth every morning.     HYDROcodone-acetaminophen (NORCO/VICODIN) 5-325 MG tablet Take 0.5-1 tablets by mouth 2 (two) times daily as needed for pain.     isosorbide mononitrate (IMDUR) 30 MG 24 hr tablet Take 30 mg by mouth daily.     ketoconazole (NIZORAL) 2 % shampoo Apply topically.     Latanoprost 0.005 % EMUL Apply to eye.     metoprolol succinate (TOPROL-XL) 50 MG 24 hr tablet Take 50 mg by mouth 2 (two) times a day. Take with or immediately following a meal.     midodrine (PROAMATINE) 2.5 MG tablet Take 2.5 mg by mouth daily.     nitroGLYCERIN (NITROSTAT) 0.4 MG SL tablet Place 0.4 mg under the tongue every 5 (five) minutes as needed for chest pain.     omeprazole (PRILOSEC) 40 MG capsule Take 40 mg by mouth daily.     ONETOUCH ULTRA test strip 2 (two) times daily.     ranolazine (RANEXA) 500 MG 12 hr tablet Take 500 mg by mouth 2 (two) times daily.     SENNOSIDES PO Take by mouth.     brimonidine (ALPHAGAN) 0.2 % ophthalmic solution  (Patient not taking: No sig reported)     cephALEXin (KEFLEX) 500 MG capsule Take 500 mg by mouth 3 (three) times daily. (Patient not taking: No sig reported)     Magnesium 250 MG TABS Take by mouth. (Patient not taking: No sig reported)     ramipril (ALTACE) 2.5 MG capsule Take by mouth.     spironolactone (ALDACTONE) 25 MG tablet Take 25 mg by mouth.     sucralfate (CARAFATE) 1 g tablet Take 1 tablet by mouth 4 (four) times daily -  before meals and at bedtime. (Patient not taking: No sig reported)      There are no  Patient Instructions on file for this visit. No follow-ups on file.   Kris Hartmann, NP

## 2021-06-18 NOTE — ED Notes (Signed)
Pt back from CT

## 2021-06-18 NOTE — Consult Note (Signed)
CODE SEPSIS - PHARMACY COMMUNICATION  **Broad Spectrum Antibiotics should be administered within 1 hour of Sepsis diagnosis**  Time Code Sepsis Called/Page Received: 1705  Antibiotics Ordered:  Aztreonam Doxycycline Metronidazole vancomycin  Time of 1st antibiotic administration: 1742  Lalani Winkles Rodriguez-Guzman PharmD, BCPS 06/18/2021 6:18 PM

## 2021-06-18 NOTE — Sepsis Progress Note (Signed)
[facility] is following this code sepsis ?

## 2021-06-18 NOTE — Consult Note (Signed)
PHARMACY -  BRIEF ANTIBIOTIC NOTE   Pharmacy has received consult(s) for Vancomycin from an ED provider.  The patient's profile has been reviewed for ht/wt/allergies/indication/available labs.    One time order(s) placed for Vancomycin '1750mg'$  x1  Further antibiotics/pharmacy consults should be ordered by admitting physician if indicated.                       Thank you, Khori Rosevear Rodriguez-Guzman PharmD, BCPS 06/18/2021 6:23 PM

## 2021-06-18 NOTE — Progress Notes (Signed)
[facility] Progress Note Patient Name: Damon English DOB: 1951-02-12 MRN: [id number]   Date of Service  06/18/2021  HPI/Events of Note  Patient admitted with out of hospital cardiac arrest, he apparently had a shockable rhythm and was shocked by EMS, but he also had complete heart block as well on the EKG at some point. He was intubated for airway protection, and is currently undergoing normothermia protocol. There is a history of recent camping with an insect bite, and the patient is febrile with a lower extremity cellulitis as well, tick borne illness needs to be ruled out, patient is on Doxycycline.  eICU Interventions  New Patient Evaluation.        Frederik Pear 06/18/2021, 9:48 PM

## 2021-06-19 ENCOUNTER — Inpatient Hospital Stay (HOSPITAL_COMMUNITY)
Admit: 2021-06-19 | Discharge: 2021-06-19 | Disposition: A | Discharge status: Home / Self Care | Payer: BC Managed Care – PPO | Attending: Critical Care Medicine | Admitting: Critical Care Medicine

## 2021-06-19 DIAGNOSIS — L03115 Cellulitis of right lower limb: Secondary | ICD-10-CM | POA: Diagnosis not present

## 2021-06-19 DIAGNOSIS — G9340 Encephalopathy, unspecified: Secondary | ICD-10-CM | POA: Diagnosis not present

## 2021-06-19 DIAGNOSIS — I469 Cardiac arrest, cause unspecified: Secondary | ICD-10-CM | POA: Diagnosis not present

## 2021-06-19 DIAGNOSIS — L8990 Pressure ulcer of unspecified site, unspecified stage: Secondary | ICD-10-CM | POA: Insufficient documentation

## 2021-06-19 LAB — APTT: aPTT: 137 seconds — ABNORMAL HIGH (ref 24–36)

## 2021-06-19 LAB — BASIC METABOLIC PANEL
Anion gap: 10 (ref 5–15)
BUN: 21 mg/dL (ref 8–23)
CO2: 28 mmol/L (ref 22–32)
Calcium: 8.5 mg/dL — ABNORMAL LOW (ref 8.9–10.3)
Chloride: 101 mmol/L (ref 98–111)
Creatinine, Ser: 1.48 mg/dL — ABNORMAL HIGH (ref 0.61–1.24)
GFR, Estimated: 51 mL/min — ABNORMAL LOW (ref >60)
Glucose, Bld: 147 mg/dL — ABNORMAL HIGH (ref 70–99)
Potassium: 2.8 mmol/L — ABNORMAL LOW (ref 3.5–5.1)
Sodium: 139 mmol/L (ref 135–145)

## 2021-06-19 LAB — TROPONIN I (HIGH SENSITIVITY)
Troponin I (High Sensitivity): 1545 ng/L (ref <18)
Troponin I (High Sensitivity): 747 ng/L (ref <18)

## 2021-06-19 LAB — GLUCOSE, CAPILLARY
Glucose-Capillary: 151 mg/dL — ABNORMAL HIGH (ref 70–99)
Glucose-Capillary: 134 mg/dL — ABNORMAL HIGH (ref 70–99)
Glucose-Capillary: 129 mg/dL — ABNORMAL HIGH (ref 70–99)
Glucose-Capillary: 89 mg/dL (ref 70–99)
Glucose-Capillary: 76 mg/dL (ref 70–99)
Glucose-Capillary: 116 mg/dL — ABNORMAL HIGH (ref 70–99)

## 2021-06-19 LAB — MAGNESIUM
Magnesium: 1.7 mg/dL (ref 1.7–2.4)
Magnesium: 2.1 mg/dL (ref 1.7–2.4)

## 2021-06-19 LAB — POTASSIUM: Potassium: 2.9 mmol/L — ABNORMAL LOW (ref 3.5–5.1)

## 2021-06-19 LAB — CBC
HCT: 34.6 % — ABNORMAL LOW (ref 39.0–52.0)
Hemoglobin: 12.3 g/dL — ABNORMAL LOW (ref 13.0–17.0)
MCH: 32.7 pg (ref 26.0–34.0)
MCHC: 35.5 g/dL (ref 30.0–36.0)
MCV: 92 fL (ref 80.0–100.0)
Platelets: 156 10*3/uL (ref 150–400)
RBC: 3.76 MIL/uL — ABNORMAL LOW (ref 4.22–5.81)
RDW: 17.5 % — ABNORMAL HIGH (ref 11.5–15.5)
WBC: 10.9 10*3/uL — ABNORMAL HIGH (ref 4.0–10.5)
nRBC: 0 % (ref 0.0–0.2)

## 2021-06-19 LAB — HEMOGLOBIN A1C
Hgb A1c MFr Bld: 6.1 % — ABNORMAL HIGH (ref 4.8–5.6)
Mean Plasma Glucose: 128.37 mg/dL

## 2021-06-19 LAB — HEPATIC FUNCTION PANEL
ALT: 42 U/L (ref 0–44)
AST: 41 U/L (ref 15–41)
Albumin: 2.4 g/dL — ABNORMAL LOW (ref 3.5–5.0)
Alkaline Phosphatase: 46 U/L (ref 38–126)
Bilirubin, Direct: 0.2 mg/dL (ref 0.0–0.2)
Indirect Bilirubin: 0.6 mg/dL (ref 0.3–0.9)
Total Bilirubin: 0.8 mg/dL (ref 0.3–1.2)
Total Protein: 5.7 g/dL — ABNORMAL LOW (ref 6.5–8.1)

## 2021-06-19 LAB — ECHOCARDIOGRAM COMPLETE
Height: 65 in
S' Lateral: 4.2 cm
Weight: 3428.59 oz

## 2021-06-19 LAB — PHOSPHORUS: Phosphorus: 3.3 mg/dL (ref 2.5–4.6)

## 2021-06-19 LAB — HEPARIN LEVEL (UNFRACTIONATED): Heparin Unfractionated: >1.1 IU/mL — ABNORMAL HIGH (ref 0.30–0.70)

## 2021-06-19 LAB — PROCALCITONIN: Procalcitonin: 0.38 ng/mL

## 2021-06-19 LAB — LACTIC ACID, PLASMA: Lactic Acid, Venous: 1.3 mmol/L (ref 0.5–1.9)

## 2021-06-19 LAB — HIV ANTIBODY (ROUTINE TESTING W REFLEX): HIV Screen 4th Generation wRfx: NONREACTIVE

## 2021-06-19 MED ORDER — APIXABAN 5 MG PO TABS: 5.0000 mg | ORAL_TABLET | Freq: Two times a day (BID) | ORAL | Status: DC

## 2021-06-19 MED ORDER — DEXMEDETOMIDINE HCL IN NACL 400 MCG/100ML IV SOLN
0.4000 ug/kg/h | INTRAVENOUS | Status: DC
  Administered 2021-06-19: 1 ug/kg/h via INTRAVENOUS
  Filled 2021-06-19: qty 100

## 2021-06-19 MED ORDER — DOCUSATE SODIUM 100 MG PO CAPS
100.0000 mg | ORAL_CAPSULE | Freq: Two times a day (BID) | ORAL | Status: DC
  Administered 2021-06-19 – 2021-06-25 (×12): 100 mg via ORAL
  Filled 2021-06-19 (×14): qty 1

## 2021-06-19 MED ORDER — CLONAZEPAM 0.25 MG PO TBDP
0.2500 mg | ORAL_TABLET | Freq: Three times a day (TID) | ORAL | Status: DC | PRN
Start: 2021-06-19 — End: 2021-06-26

## 2021-06-19 MED ORDER — CHLORHEXIDINE GLUCONATE CLOTH 2 % EX PADS
6.0000 | MEDICATED_PAD | Freq: Every day | CUTANEOUS | Status: DC
  Administered 2021-06-19 – 2021-06-24 (×5): 6 via TOPICAL

## 2021-06-19 MED ORDER — ACETAMINOPHEN 325 MG PO TABS
650.0000 mg | ORAL_TABLET | Freq: Four times a day (QID) | ORAL | Status: DC | PRN
  Filled 2021-06-19: qty 2

## 2021-06-19 MED ORDER — POTASSIUM CHLORIDE CRYS ER 20 MEQ PO TBCR
40.0000 meq | EXTENDED_RELEASE_TABLET | Freq: Once | ORAL | Status: AC
  Administered 2021-06-19: 40 meq via ORAL
  Filled 2021-06-19: qty 2

## 2021-06-19 MED ORDER — ACETAMINOPHEN 160 MG/5ML PO SOLN
650.0000 mg | Freq: Four times a day (QID) | ORAL | Status: DC | PRN
  Filled 2021-06-19: qty 20.3

## 2021-06-19 MED ORDER — VANCOMYCIN HCL IN DEXTROSE 1-5 GM/200ML-% IV SOLN
1000.0000 mg | INTRAVENOUS | Status: DC
  Filled 2021-06-19: qty 200

## 2021-06-19 MED ORDER — POTASSIUM CHLORIDE 20 MEQ PO PACK
40.0000 meq | PACK | Freq: Once | ORAL | Status: AC
  Administered 2021-06-19: 40 meq
  Filled 2021-06-19: qty 2

## 2021-06-19 MED ORDER — APIXABAN 5 MG PO TABS: 10.0000 mg | ORAL_TABLET | Freq: Two times a day (BID) | ORAL | Status: DC

## 2021-06-19 MED ORDER — SODIUM CHLORIDE 0.9 % IV SOLN
2.0000 g | Freq: Three times a day (TID) | INTRAVENOUS | Status: DC
  Administered 2021-06-19 – 2021-06-22 (×9): 2 g via INTRAVENOUS
  Filled 2021-06-19 (×12): qty 2

## 2021-06-19 MED ORDER — PERFLUTREN LIPID MICROSPHERE
1.0000 mL | INTRAVENOUS | Status: AC | PRN
  Administered 2021-06-19: 2.5 mL via INTRAVENOUS
  Filled 2021-06-19: qty 10

## 2021-06-19 MED ORDER — ADULT MULTIVITAMIN W/MINERALS CH
1.0000 | ORAL_TABLET | Freq: Every day | ORAL | Status: DC
  Administered 2021-06-20 – 2021-06-24 (×5): 1 via ORAL
  Filled 2021-06-19 (×5): qty 1

## 2021-06-19 MED ORDER — POLYETHYLENE GLYCOL 3350 17 G PO PACK: 17.0000 g | PACK | Freq: Every day | ORAL | Status: DC | PRN

## 2021-06-19 MED ORDER — HEPARIN (PORCINE) 25000 UT/250ML-% IV SOLN
850.0000 [IU]/h | INTRAVENOUS | Status: DC
  Administered 2021-06-19: 850 [IU]/h via INTRAVENOUS

## 2021-06-19 MED ORDER — CLONAZEPAM 0.25 MG PO TBDP: 0.2500 mg | ORAL_TABLET | Freq: Three times a day (TID) | ORAL | Status: DC | PRN

## 2021-06-19 MED ORDER — ACETAMINOPHEN 650 MG RE SUPP: 650.0000 mg | Freq: Four times a day (QID) | RECTAL | Status: DC | PRN

## 2021-06-19 MED ORDER — APIXABAN 5 MG PO TABS
5.0000 mg | ORAL_TABLET | Freq: Two times a day (BID) | ORAL | Status: DC
  Administered 2021-06-19 – 2021-06-23 (×8): 5 mg via ORAL
  Filled 2021-06-19 (×8): qty 1

## 2021-06-19 MED ORDER — VANCOMYCIN HCL 2000 MG/400ML IV SOLN
2000.0000 mg | Freq: Once | INTRAVENOUS | Status: DC
  Filled 2021-06-19: qty 400

## 2021-06-19 MED ORDER — DIPHENHYDRAMINE HCL 25 MG PO CAPS
50.0000 mg | ORAL_CAPSULE | Freq: Four times a day (QID) | ORAL | Status: DC | PRN
  Administered 2021-06-19: 50 mg via ORAL
  Filled 2021-06-19: qty 1
  Filled 2021-06-19 (×2): qty 2
  Filled 2021-06-19: qty 1

## 2021-06-19 MED ORDER — POTASSIUM CHLORIDE 10 MEQ/100ML IV SOLN
10.0000 meq | INTRAVENOUS | Status: AC
  Administered 2021-06-19 – 2021-06-20 (×4): 10 meq via INTRAVENOUS
  Filled 2021-06-19 (×4): qty 100

## 2021-06-19 MED ORDER — POTASSIUM CHLORIDE 10 MEQ/100ML IV SOLN
10.0000 meq | INTRAVENOUS | Status: AC
Start: 2021-06-19 — End: 2021-06-19
  Administered 2021-06-19 (×2): 10 meq via INTRAVENOUS
  Filled 2021-06-19 (×2): qty 100

## 2021-06-19 MED ORDER — INSULIN ASPART 100 UNIT/ML IJ SOLN
0.0000 [IU] | Freq: Three times a day (TID) | INTRAMUSCULAR | Status: DC
  Administered 2021-06-20 – 2021-06-23 (×4): 2 [IU] via SUBCUTANEOUS
  Administered 2021-06-24: 3 [IU] via SUBCUTANEOUS
  Administered 2021-06-24 – 2021-06-25 (×2): 2 [IU] via SUBCUTANEOUS
  Filled 2021-06-19 (×8): qty 1

## 2021-06-19 MED ORDER — MAGNESIUM SULFATE 2 GM/50ML IV SOLN
2.0000 g | Freq: Once | INTRAVENOUS | Status: AC
  Administered 2021-06-19: 2 g via INTRAVENOUS
  Filled 2021-06-19: qty 50

## 2021-06-19 MED ORDER — FOLIC ACID 1 MG PO TABS
1.0000 mg | ORAL_TABLET | Freq: Every day | ORAL | Status: DC
  Administered 2021-06-20 – 2021-06-25 (×6): 1 mg via ORAL
  Filled 2021-06-19 (×6): qty 1

## 2021-06-19 MED ORDER — ACETAMINOPHEN 325 MG PO TABS
650.0000 mg | ORAL_TABLET | Freq: Four times a day (QID) | ORAL | Status: DC | PRN
  Administered 2021-06-20 – 2021-06-25 (×6): 650 mg via ORAL
  Filled 2021-06-19 (×5): qty 2

## 2021-06-19 MED ORDER — POTASSIUM CHLORIDE 10 MEQ/100ML IV SOLN
10.0000 meq | INTRAVENOUS | Status: AC
  Administered 2021-06-19 (×4): 10 meq via INTRAVENOUS
  Filled 2021-06-19 (×4): qty 100

## 2021-06-19 NOTE — Progress Notes (Signed)
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  Is on NOAC for now.  No ASA with it. Not allergic per patient.     Patient Measurements: Height: '5\' 5"'$  (165.1 cm) Weight: 97.2 kg (214 lb 4.6 oz) IBW/kg (Calculated) : 61.5 Heparin Dosing Weight: 83.8 kg  Vital Signs: Temp: 98.6 F (37 C) (09/03 0800) Temp Source: Bladder (09/03 0800) BP: 125/51 (09/03 0800) Pulse Rate: 64 (09/03 0800)  Labs: Recent Labs    06/18/21 1513 06/18/21 1731 06/18/21 2100 06/18/21 2128 06/18/21 2358 06/19/21 0630  HGB 14.5  --  14.6  --   --  12.3*  HCT 43.4  --  42.6  --   --  34.6*  PLT 201  --  173  --   --  156  APTT  --  34  --   --   --  137*  LABPROT  --  19.4*  --   --   --   --   INR  --  1.6*  --   --   --   --   HEPARINUNFRC  --   --   --  >1.10*  --  >1.10*  CREATININE 1.71*  --  1.64*  --   --  1.48*  TROPONINIHS 52* 701* 1,533*  --  1,545* 747*     Estimated Creatinine Clearance: 51.2 mL/min (A) (by C-G formula based on SCr of 1.48 mg/dL (H)).   Medical History: Past Medical History:  Diagnosis Date   Alcohol use    "cutting back" but still heavy and daily, multiple shots of bourbon each night   Diabetes mellitus without complication ([place])    History of tobacco use    reportedly quit in 2013   Hypertension    Pulmonary embolism ([place]) 2012   unprovoked   PVD (peripheral vascular disease) ([place])    Assessment: Patient is a 70yo male admitted following cardiac arrest.Pharmacy consulted for Heparin dosing for ACS. Patient was taking apixaban prior to admission  for history of DVT/PE, his last dose was the morning of 9/2.  9/3 0630 HL > 1.1 aPTT 137  Goal of Therapy:  Heparin level 0.3-0.7 units/ml, once aPTT and heparin level correlate  aPTT 66 - 102 seconds Monitor platelets by anticoagulation protocol: Yes   Plan:  aPTT is elevated. Will hold heparin infusion for 1 hours and restart at 850 units/hr. Recheck heparin level in 6 hours. CBC and heparin level daily while on heparin. Switch to heparin level once aPTT and heparin level correlate.   Eleonore Chiquito, PharmD, BCPS 06/19/2021 8:51 AM

## 2021-06-19 NOTE — Progress Notes (Signed)
Pharmacy Antibiotic Note  Damon English is a 70 y.o. male admitted on 06/18/2021 with sepsis.  Pharmacy has been consulted for Vancomycin dosing.  Plan: Vancomycin dose were not given yesterday. Re-ordered vancomycin 2000 mg x 1 loading dose (20 mg/kg) followed by 1000 mg q24H for a predicted AUC of 529. Goal AUC 400-550. Plan to obtain vancomycin level prior to the 4th and 5th dose.   No gram negative coverage ordered. Will f/u with attending if needed. Pt received aztronam x 1 on 9/2. Pt tolerated ancef in the past.   Also on doxycycline 100 mg BID IV    Height: '5\' 5"'$  (165.1 cm) Weight: 97.2 kg (214 lb 4.6 oz) IBW/kg (Calculated) : 61.5  Temp (24hrs), Avg:99.1 F (37.3 C), Min:97.3 F (36.3 C), Max:100.6 F (38.1 C)  Recent Labs  Lab 06/18/21 1513 06/18/21 1731 06/18/21 2000 06/18/21 2100 06/19/21 0630  WBC 12.0*  --   --  10.6* 10.9*  CREATININE 1.71*  --   --  1.64* 1.48*  LATICACIDVEN  --  2.6* 2.1*  --  1.3     Estimated Creatinine Clearance: 51.2 mL/min (A) (by C-G formula based on SCr of 1.48 mg/dL (H)).    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  Is on NOAC for now.  No ASA with it. Not allergic per patient.     Antimicrobials this admission: Doxycycline 9/2 >>  Vancomycin 9/2 >>   Dose adjustments this admission:  Microbiology results: 9/2 Bcx: pending  9/2 Ucx : pending   Thank you for allowing pharmacy to be a part of this patient's care.  Eleonore Chiquito, PharmD, BCPS 06/19/2021 8:24 AM

## 2021-06-19 NOTE — Plan of Care (Signed)

## 2021-06-19 NOTE — Consult Note (Addendum)
PHARMACY CONSULT NOTE - FOLLOW UP  Pharmacy Consult for Electrolyte Monitoring and Replacement   Recent Labs: Potassium (mmol/L)  Date Value  06/19/2021 2.8 (L)  11/08/2013 3.4 (L)   Magnesium (mg/dL)  Date Value  06/19/2021 1.7   Calcium (mg/dL)  Date Value  06/19/2021 8.5 (L)   Calcium, Total (mg/dL)  Date Value  11/08/2013 9.3   Albumin (g/dL)  Date Value  06/19/2021 2.4 (L)   Phosphorus (mg/dL)  Date Value  06/19/2021 3.3   Sodium (mmol/L)  Date Value  06/19/2021 139  11/08/2013 136     Assessment: 70 yo male presented to [facility] ER on 09/2 following a cardiac arrest.  Per ER notes pts wife reported while the pt was leaving his outpatient doctors office he felt like he was going to pass out.  He sat on his walker and lost consciousness.  Pts wife reported he briefly regained consciousness several times EMS notified.  When EMS arrived pt unresponsive and had a pulse, but required defibrillation x2. On a ventilator.   On LR @ 150 ml/hr.   Pt received Kcl 10 mEq IV x 2 and KCL PO 40 mEq x 1.   Goal of Therapy:  WNL  Plan:  Will give KCl 10 mEq IV x 6.  Will give Mg 2 g IV x 1.   Oswald Hillock ,PharmD, BCPS Clinical Pharmacist 06/19/2021 8:11 AM

## 2021-06-19 NOTE — Progress Notes (Signed)
Patient was extubated, tolerated well. Patient is on 4L Oxygen. Alert and oriented, in Normal Sinus Rhythm. Discontinued normotensive therapy and vasopressors. Patient blood pressure remains stable and the patient has no complaint of pain.

## 2021-06-19 NOTE — Progress Notes (Addendum)
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  Is on NOAC for now.  No ASA with it. Not allergic per patient.     Patient Measurements: Height: '5\' 5"'$  (165.1 cm) Weight: 97.2 kg (214 lb 4.6 oz) IBW/kg (Calculated) : 61.5 Heparin Dosing Weight: 83.8 kg  Vital Signs: Temp: 97.8 F (36.6 C) (09/03 1100) Temp Source: Axillary (09/03 1100) BP: 127/59 (09/03 1415) Pulse Rate: 69 (09/03 1415)  Labs: Recent Labs    06/18/21 1513 06/18/21 1731 06/18/21 2100 06/18/21 2128 06/18/21 2358 06/19/21 0630  HGB 14.5  --  14.6  --   --  12.3*  HCT 43.4  --  42.6  --   --  34.6*  PLT 201  --  173  --   --  156  APTT  --  34  --   --   --  137*  LABPROT  --  19.4*  --   --   --   --   INR  --  1.6*  --   --   --   --   HEPARINUNFRC  --   --   --  >1.10*  --  >1.10*  CREATININE 1.71*  --  1.64*  --   --  1.48*  TROPONINIHS 52* 701* 1,533*  --  1,545* 747*     Estimated Creatinine Clearance: 51.2 mL/min (A) (by C-G formula based on SCr of 1.48 mg/dL (H)).   Medical History: Past Medical History:  Diagnosis Date   Alcohol use    "cutting back" but still heavy and daily, multiple shots of bourbon each night   Diabetes mellitus without complication ([place])    History of tobacco use    reportedly quit in 2013   Hypertension    Pulmonary embolism ([place]) 2012   unprovoked   PVD (peripheral vascular disease) ([place])    Assessment: Patient is a 70yo male admitted following cardiac arrest. Pt was previously on heparin drip for chest pain/ACS. Pharmacy has been consulted for apixaban dosing  for DVT/PE.   Goal of Therapy:  Monitor platelets by anticoagulation protocol: Yes   Plan:  Per NP, no loading dose and will continue pt home dose of apixaban 5 mg BID Monitor CBC per protocol   Sherilyn Banker, PharmD 06/19/2021 4:39 PM

## 2021-06-19 NOTE — Progress Notes (Addendum)
NAME:  Damon English, MRN:  [id number], DOB:  Jun 29, 1951, LOS: 1 ADMISSION DATE:  06/18/2021, CONSULTATION DATE: 06/18/2021 REFERRING MD: Dr. Kerman Passey, CHIEF COMPLAINT: Cardiac Arrest   History of Present Illness:  This is a 70 yo male presented to [facility] ER on 09/2 following a cardiac arrest.  Per ER notes pts wife reported while the pt was leaving his outpatient doctors office he felt like he was going to pass out.  He sat on his walker and lost consciousness.  Pts wife reported he briefly regained consciousness several times EMS notified.  When EMS arrived pt unresponsive and had a pulse, but required defibrillation x2.  En route to the ER he required bag mask ventilation.    ED course Upon arrival to the ER pt remained minimally responsive (opening eyes and moaning), however unable to follow commands.  Pt mechanically intubated per ED provider.  Pt hypertensive, however EKG x2 revealed complete heart block hr 50's and no concerning ST changes.  Cardiologist Dr. Fletcher Anon contacted by ER physician and given pts initial heart block rate remain in the 50's and pt remained hypertensive there was no indication for cardiac intervention, recommended continued medical management.  CT Head negative for acute abnormality and CTA Chest negative for PE, however concerning for early cirrhosis.  Lab results revealed K+ 2.2, chloride 91, glucose 240, creatinine 1.71, AST 99, ALT 77, troponin 52, wbc 12.0, and Respiratory panel by RT-PCR negative.  Pt also febrile temp 100/1~100.5 degrees F with right lower extremity warm to touch along with 2+ edema.  He received azactam, vancomycin, and doxycycline.   According to pts wife they went camping over the weekend and pt reported a right flank unknown insect bite.  Following camping trip on 08/31 pts wife stated he began having fevers and right lower extremity pain.  He saw his outpatient vascular physician on 09/2 for evaluation of symptoms and all test came back negative.     PCCM team contacted by ER physician for ICU admission and normothermia protocol initiated.   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 Chronic Systolic CHF EF [id number] Orthostatic Hypotension  Mixed HLD PAD s/p Right Leg Bypass  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 Cerebral Amyloid Angiopathy  VT Arrest (discharged with a life vest) S/p CE/IOL/Dextenza of right eye 04/22/2021 S/p CE/IOL/Dextenza of left eye 04/29/2021  Significant Hospital Events: Including procedures, antibiotic start and stop dates in addition to other pertinent events   09/2: Pt admitted to ICU post cardiac arrest mechanically intubated and normothermic protocol initiated   Significant Test Results:  CT Head 09/2>>No acute abnormality Chronic infarct left parietal lobe. Chronic microvascular ischemic change in the white matter. CTA Chest 09/2>>No evidence of acute pulmonary embolism or other acute chest process. Diffuse coronary and Aortic Atherosclerosis (ICD10-I70.0). Previous CABG. Mild emphysema with probable mild dependent atelectasis in both lungs. Potential changes of early cirrhosis.  Mildly displaced acute fracture of the right sixth rib clearly nondisplaced fractures of the right anterior fourth fifth and seventh ribs without pneumothorax or pleural effusion.  This is likely a result from CPR. CT Femur Right 09/2>>previous right proximal femoral ORIF CT Foot Right 09/2>> changes consistent with cellulitis CT Tibia Fibular Right 09/2>> extensive atherosclerosis post right femoral-popliteal bypass grafting  Micro Data:  Respiratory panel by RT-PCR 09/2>>negative  Blood x2 09/2>> Urine 09/2>> Lyme disease serology w/reflex 09/2>> [place] spotted fvr abs pnl (IgG+IgM) 09/2>>  Antimicrobials:  Azactam 09/2 x1 dose Doxycycline 09/2>> Vancomycin 09/2>> 9/3 Cefepime 9/3>>  Interim History / Subjective:  Pt remains mechanically intubated  awake/agitated despite propofol gtt. patient started on some Precedex, propofol discontinued, he became more oriented and was able to follow commands.  Awaiting spontaneous breathing trial, will proceed with extubation  Objective   Blood pressure (!) 127/59, pulse 69, temperature 97.8 F (36.6 C), temperature source Axillary, resp. rate (!) 23, height '5\' 5"'$  (1.651 m), weight 97.2 kg, SpO2 100 %.    Vent Mode: PSV;CPAP FiO2 (%):  [40 %] 40 % Set Rate:  [12 bmp] 12 bmp Vt Set:  [500 mL] 500 mL PEEP:  [5 cmH20] 5 cmH20 Pressure Support:  [5 cmH20] 5 cmH20 Plateau Pressure:  [7 cmH20-17 cmH20] 17 cmH20   Intake/Output Summary (Last 24 hours) at 06/19/2021 1522 Last data filed at 06/19/2021 1500 Gross per 24 hour  Intake 4231 ml  Output 786 ml  Net 3445 ml   Filed Weights   06/18/21 1507 06/18/21 2100 06/19/21 0444  Weight: 93.2 kg 97.2 kg 97.2 kg    Examination: General: acutely ill appearing male, agitated mechanically intubated  HENT: supple, no JVD  Lungs: faint rhonchi throughout, even, non labored  Cardiovascular: sinus tach, no R/G, 2+ radial/1+ distal pulses, 2+ RLE edema  Abdomen: +BS x4, soft, non distended, right flank ecchymotic area  Extremities: moves all extremities, RLE 2+ edema/erythema/warmth Neuro: sedated, agitated, not following commands, PERRL  GU: indwelling catheter in place draining yellow urine   Resolved Hospital Problem list     Assessment & Plan:  Acute hypoxic respiratory failure post cardiac arrest  Mechanical intubation  Hx: Tobacco Abuse  -Tolerating SBT, anticipate extubation - Daily WUA initially agitated now doing very well on Precedex - Ensure adequate pulmonary hygiene  - PAD protocol in place: discontinued Fentanyl and propofol gtts anticipating extubation - Normothermic protocol terminated due to patient being uncomfortable  Cardiac arrest requiring defibrillation suspected secondary to electrolyte imbalance Elevated troponin likely  demand ischemia: 52~ Complete heart block~resolved  Intermittent HTN  Hx: 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, Chronic Systolic CHF EF [id number], Orthostatic Hypotension, 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, VT Arrest (discharged with a life vest), Mixed HLD, and PAD s/p Right Leg Bypass  - Continuous telemetry monitoring  - Trend troponin's, peaked at 1000 now decreasing. - Maintain map >65 - Hold outpatient antihypertensives for now  - Cardiology contacted by ER physician who recommended no indication for intervention for complete heart block continue medication management  - Echo showed LVEF of 50% no regional wall motion abnormalities noted.  Suspect that arrest occurred due to hypokalemia  Acute kidney injury secondary to ATN  Hypokalemia  - Trend BMP; Magnesium and phosphorous levels pending - Replace electrolytes as indicated - Monitor UOP - Avoid nephrotoxic medications  - Relative hypomagnesemia we will give 1 g mag sulfate  Sepsis suspected secondary to tick-borne illness vs. right lower extremity cellulitis  - Trend WBC and monitor fever curve  - Trend PCT  - Follow microbiology data - Continue abx therapy as outlined above  - Cellulitis right lower extremity confirmed by CT, cefepime - Recent exposure to possible tick bite continue doxycycline  Transaminitis~CT Chest 09/2 concerning for early cirrhosis  - Trend hepatic function panel  - Hold outpatient atorvastatin for now  - [place] RUQ pending   Type II diabetes mellitus  - Hemoglobin A1c 8.1 - CBG's q4hrs  - SSI   Acute encephalopathy resolving ~CT Head 09/2 negative  for acute abnormality  - EEG not needed now as the patient appears to be clearing clouded sensorium - Discontinue normothermic protocol patient complaining of being cold -  clearly improving  History of alcohol use -Monitor for potential DTs -Correct electrolytes -Supplement thiamine and folate  Rib fractures due to CPR -Currently asymptomatic -Pain control as  needed  Best Practice (right click and "Reselect all SmartList Selections" daily)   Diet/type: NPO, swallow assessment after extubation DVT prophylaxis: prophylactic heparin  GI prophylaxis: H2B Lines: N/A Foley:  Yes, and it is still needed Code Status:  full code Last date of multidisciplinary goals of care discussion [N/A]  Pts wife at bedside and updated regarding current plan of care and all questions were answered.   Labs   CBC: Recent Labs  Lab 06/18/21 1513 06/18/21 2100 06/19/21 0630  WBC 12.0* 10.6* 10.9*  NEUTROABS 8.5*  --   --   HGB 14.5 14.6 12.3*  HCT 43.4 42.6 34.6*  MCV 91.9 93.2 92.0  PLT 201 173 156     Basic Metabolic Panel: Recent Labs  Lab 06/18/21 1513 06/18/21 1731 06/18/21 2100 06/18/21 2358 06/19/21 0630  NA 135  --   --   --  139  K 2.2*  --   --  2.4* 2.8*  CL 91*  --   --   --  101  CO2 29  --   --   --  28  GLUCOSE 240*  --   --   --  147*  BUN 20  --   --   --  21  CREATININE 1.71*  --  1.64*  --  1.48*  CALCIUM 9.5  --   --   --  8.5*  MG  --  1.8  --   --  1.7  PHOS  --   --   --   --  3.3    GFR: Estimated Creatinine Clearance: 51.2 mL/min (A) (by C-G formula based on SCr of 1.48 mg/dL (H)). Recent Labs  Lab 06/18/21 1513 06/18/21 1731 06/18/21 2000 06/18/21 2100 06/19/21 0630  PROCALCITON  --   --   --  0.31 0.38  WBC 12.0*  --   --  10.6* 10.9*  LATICACIDVEN  --  2.6* 2.1*  --  1.3     Liver Function Tests: Recent Labs  Lab 06/18/21 1513 06/19/21 0630  AST 99* 41  ALT 77* 42  ALKPHOS 68 46  BILITOT 1.4* 0.8  PROT 8.4* 5.7*  ALBUMIN 4.1 2.4*    No results for input(s): LIPASE, AMYLASE in the last 168 hours. No results for input(s): AMMONIA in the last 168 hours.  ABG    Component Value Date/Time   PHART 7.48 (H) 06/18/2021 1930   PCO2ART 47 06/18/2021 1930   PO2ART 125 (H) 06/18/2021 1930   HCO3 35.0 (H) 06/18/2021 1930   O2SAT 99.0 06/18/2021 1930      Coagulation Profile: Recent Labs   Lab 06/18/21 1731  INR 1.6*    Cardiac Enzymes: No results for input(s): CKTOTAL, CKMB, CKMBINDEX, TROPONINI in the last 168 hours.  HbA1C: Hgb A1c MFr Bld  Date/Time Value Ref Range Status  06/18/2021 09:00 PM 6.1 (H) 4.8 - 5.6 % Final    Comment:    (NOTE) Pre diabetes:          5.7%-6.4%  Diabetes:              >6.4%  Glycemic control for   <7.0%  adults with diabetes     CBG: Recent Labs  Lab 06/18/21 2325 06/19/21 0356 06/19/21 0738 06/19/21 1141  GLUCAP 147* 151* 134* 129*    Review of Systems:   Unable to assess fully as pt is intubated and mechanically ventilated  Allergies Allergies  Allergen Reactions   Amoxicillin Other (See Comments)    Abdominal cramps Other reaction(s): Other (See Comments) Abdominal cramps Tolerated ancef 7/16   Hydromorphone Hcl     Quick & severe nausea/vomiting  Other reaction(s): Vomiting Quick & severe nausea/vomiting    Aspirin     Other reaction(s): Other (See Comments) He has likely cerebral amyloid.  Is on NOAC for now.  No ASA with it. Not allergic per patient.     Scheduled Meds:  Chlorhexidine Gluconate Cloth  6 each Topical Daily   docusate  100 mg Per Tube BID   folic acid  1 mg Per Tube Daily   insulin aspart  0-15 Units Subcutaneous Q4H   multivitamin with minerals  1 tablet Per Tube Daily   pantoprazole (PROTONIX) IV  40 mg Intravenous QHS   polyethylene glycol  17 g Per Tube Daily   [START ON 06/23/2021] thiamine injection  100 mg Intravenous Daily   Continuous Infusions:  sodium chloride     ceFEPime (MAXIPIME) IV Stopped (06/19/21 1316)   dexmedetomidine (PRECEDEX) IV infusion 0.3 mcg/kg/hr (06/19/21 1400)   doxycycline (VIBRAMYCIN) IV Stopped (06/19/21 0737)   heparin 850 Units/hr (06/19/21 1400)   norepinephrine (LEVOPHED) Adult infusion Stopped (06/19/21 0948)   thiamine injection Stopped (06/19/21 0036)   [START ON 06/20/2021] vancomycin     PRN Meds:.acetaminophen **OR** acetaminophen  (TYLENOL) oral liquid 160 mg/5 mL **OR** acetaminophen, clonazepam, ondansetron (ZOFRAN) IV    Critical care time: 40 minutes   The patient is critically ill with multiple organ systems failure and requires high complexity decision making for assessment and support, frequent evaluation and titration of therapies, application of advanced monitoring technologies and extensive interpretation of multiple databases. Critical Care Time devoted to patient care services described in this note is  40 minutes.  Renold Don, MD Advanced Bronchoscopy PCC[facility]    *This note was dictated using voice recognition software/Dragon.  Despite best efforts to proofread, errors can occur which can change the meaning.  Any change was purely unintentional.

## 2021-06-19 NOTE — Progress Notes (Signed)
*  PRELIMINARY RESULTS* Echocardiogram 2D Echocardiogram has been performed. Definity IV Contrast used on this study.  Damon English 06/19/2021, 9:22 AM

## 2021-06-20 ENCOUNTER — Encounter (INDEPENDENT_AMBULATORY_CARE_PROVIDER_SITE_OTHER): Payer: Self-pay

## 2021-06-20 DIAGNOSIS — J9601 Acute respiratory failure with hypoxia: Secondary | ICD-10-CM

## 2021-06-20 DIAGNOSIS — N170 Acute kidney failure with tubular necrosis: Secondary | ICD-10-CM

## 2021-06-20 DIAGNOSIS — E876 Hypokalemia: Secondary | ICD-10-CM

## 2021-06-20 DIAGNOSIS — F1010 Alcohol abuse, uncomplicated: Secondary | ICD-10-CM

## 2021-06-20 DIAGNOSIS — J9602 Acute respiratory failure with hypercapnia: Secondary | ICD-10-CM

## 2021-06-20 DIAGNOSIS — I5022 Chronic systolic (congestive) heart failure: Secondary | ICD-10-CM

## 2021-06-20 LAB — CBC
HCT: 33.8 % — ABNORMAL LOW (ref 39.0–52.0)
Hemoglobin: 11.3 g/dL — ABNORMAL LOW (ref 13.0–17.0)
MCH: 32.3 pg (ref 26.0–34.0)
MCHC: 33.4 g/dL (ref 30.0–36.0)
MCV: 96.6 fL (ref 80.0–100.0)
Platelets: 136 10*3/uL — ABNORMAL LOW (ref 150–400)
RBC: 3.5 MIL/uL — ABNORMAL LOW (ref 4.22–5.81)
RDW: 17.5 % — ABNORMAL HIGH (ref 11.5–15.5)
WBC: 7.9 10*3/uL (ref 4.0–10.5)
nRBC: 0 % (ref 0.0–0.2)

## 2021-06-20 LAB — BASIC METABOLIC PANEL
Anion gap: 7 (ref 5–15)
BUN: 17 mg/dL (ref 8–23)
CO2: 26 mmol/L (ref 22–32)
Calcium: 8 mg/dL — ABNORMAL LOW (ref 8.9–10.3)
Chloride: 103 mmol/L (ref 98–111)
Creatinine, Ser: 1.34 mg/dL — ABNORMAL HIGH (ref 0.61–1.24)
GFR, Estimated: 58 mL/min — ABNORMAL LOW (ref >60)
Glucose, Bld: 108 mg/dL — ABNORMAL HIGH (ref 70–99)
Potassium: 3 mmol/L — ABNORMAL LOW (ref 3.5–5.1)
Sodium: 136 mmol/L (ref 135–145)

## 2021-06-20 LAB — HEPATIC FUNCTION PANEL
ALT: 34 U/L (ref 0–44)
AST: 30 U/L (ref 15–41)
Albumin: 2.8 g/dL — ABNORMAL LOW (ref 3.5–5.0)
Alkaline Phosphatase: 48 U/L (ref 38–126)
Bilirubin, Direct: 0.2 mg/dL (ref 0.0–0.2)
Indirect Bilirubin: 0.8 mg/dL (ref 0.3–0.9)
Total Bilirubin: 1 mg/dL (ref 0.3–1.2)
Total Protein: 6.2 g/dL — ABNORMAL LOW (ref 6.5–8.1)

## 2021-06-20 LAB — PHOSPHORUS: Phosphorus: 2.4 mg/dL — ABNORMAL LOW (ref 2.5–4.6)

## 2021-06-20 LAB — URINE CULTURE: Culture: NO GROWTH

## 2021-06-20 LAB — GLUCOSE, CAPILLARY
Glucose-Capillary: 100 mg/dL — ABNORMAL HIGH (ref 70–99)
Glucose-Capillary: 99 mg/dL (ref 70–99)
Glucose-Capillary: 124 mg/dL — ABNORMAL HIGH (ref 70–99)
Glucose-Capillary: 104 mg/dL — ABNORMAL HIGH (ref 70–99)

## 2021-06-20 LAB — POTASSIUM
Potassium: 2.4 mmol/L — CL (ref 3.5–5.1)
Potassium: 3.4 mmol/L — ABNORMAL LOW (ref 3.5–5.1)

## 2021-06-20 LAB — PROCALCITONIN: Procalcitonin: 0.25 ng/mL

## 2021-06-20 LAB — MAGNESIUM: Magnesium: 2 mg/dL (ref 1.7–2.4)

## 2021-06-20 MED ORDER — POTASSIUM CHLORIDE 10 MEQ/100ML IV SOLN
10.0000 meq | INTRAVENOUS | Status: AC
  Administered 2021-06-20 (×4): 10 meq via INTRAVENOUS
  Filled 2021-06-20 (×4): qty 100

## 2021-06-20 MED ORDER — POTASSIUM CHLORIDE CRYS ER 20 MEQ PO TBCR
40.0000 meq | EXTENDED_RELEASE_TABLET | Freq: Once | ORAL | Status: AC
  Administered 2021-06-20: 40 meq via ORAL
  Filled 2021-06-20: qty 2

## 2021-06-20 MED ORDER — CLOPIDOGREL BISULFATE 75 MG PO TABS
75.0000 mg | ORAL_TABLET | Freq: Every day | ORAL | Status: DC
  Administered 2021-06-20 – 2021-06-25 (×6): 75 mg via ORAL
  Filled 2021-06-20 (×6): qty 1

## 2021-06-20 MED ORDER — DOXYCYCLINE HYCLATE 100 MG PO TABS
100.0000 mg | ORAL_TABLET | Freq: Two times a day (BID) | ORAL | Status: DC
  Administered 2021-06-20 – 2021-06-25 (×11): 100 mg via ORAL
  Filled 2021-06-20 (×11): qty 1

## 2021-06-20 MED ORDER — LEVALBUTEROL HCL 1.25 MG/0.5ML IN NEBU: 1.2500 mg | INHALATION_SOLUTION | Freq: Four times a day (QID) | RESPIRATORY_TRACT | Status: DC | PRN

## 2021-06-20 MED ORDER — RISAQUAD PO CAPS
2.0000 | ORAL_CAPSULE | Freq: Three times a day (TID) | ORAL | Status: DC
  Administered 2021-06-21 – 2021-06-25 (×15): 2 via ORAL
  Filled 2021-06-20 (×16): qty 2

## 2021-06-20 MED ORDER — LEVALBUTEROL HCL 0.63 MG/3ML IN NEBU: 0.6300 mg | INHALATION_SOLUTION | Freq: Once | RESPIRATORY_TRACT | Status: DC

## 2021-06-20 MED ORDER — DIPHENHYDRAMINE HCL 25 MG PO CAPS
25.0000 mg | ORAL_CAPSULE | Freq: Once | ORAL | Status: DC | PRN
  Filled 2021-06-20: qty 1

## 2021-06-20 MED ORDER — PHENOL 1.4 % MT LIQD
1.0000 | OROMUCOSAL | Status: DC | PRN
  Filled 2021-06-20: qty 177

## 2021-06-20 MED ORDER — GUAIFENESIN ER 600 MG PO TB12
600.0000 mg | ORAL_TABLET | Freq: Two times a day (BID) | ORAL | Status: DC
  Administered 2021-06-20 – 2021-06-25 (×12): 600 mg via ORAL
  Filled 2021-06-20 (×12): qty 1

## 2021-06-20 MED ORDER — LEVALBUTEROL HCL 0.63 MG/3ML IN NEBU
INHALATION_SOLUTION | RESPIRATORY_TRACT | Status: AC
  Administered 2021-06-20: 0.63 mg
  Filled 2021-06-20: qty 3

## 2021-06-20 MED ORDER — BACID PO TABS
2.0000 | ORAL_TABLET | Freq: Three times a day (TID) | ORAL | Status: DC
  Filled 2021-06-20 (×3): qty 2

## 2021-06-20 NOTE — Consult Note (Signed)
[facility] Consultation Note  Patient ID: Damon English, MRN: [id number], DOB/AGE: 02/18/1951 70 y.o. Admit date: 06/18/2021   Date of Consult: 06/20/2021 Primary Physician: [name], PA-C Primary Cardiologist: [name]  Chief Complaint:  Chief Complaint  Patient presents with   Cardiac Arrest    Pt had witness arrest approx 1436 at vascular institute. Received 2 shocks via AED and achieved ROSC. Pt unresponsive with agonal breathing on arrival.   Reason for Consult: HPI     Cardiac Arrest    Additional comments: Pt had witness arrest approx 1436 at vascular institute. Received 2 shocks via AED and achieved ROSC. Pt unresponsive with agonal breathing on arrival.      Last edited by Charleen Kirks, RN on 06/18/2021  3:05 PM.       HPI: 70 y.o. male with known coronary artery disease status post previous coronary bypass graft and stenting with severe peripheral vascular disease and mild LV systolic dysfunction on previously appropriate medication management with previous pulmonary embolism hypertension and hyperlipidemia.  The patient has recently had a significant leg concerns with the possibility of infection and/or insect bite for which he had evaluation by peripheral vascular disease.  The patient had normal perfusion with no evidence of venous Doppler abnormalities.  Then the patient was seen by acute care for which the patient had an acute episode of syncope.  There was apparent cardiac arrest although it does appear the patient was conscious off and on during CPR and.  Defibrillation x2.  There was complete heart block hypokalemia and hypotension for which were corrected.  At that time there was not any evidence of acute ST elevation myocardial infarction or intervention necessary.  Since then the patient has had corrected hypokalemia and hypotension with possible sepsis and now acute kidney injury with glomerular filtration rate of 51.  In addition to that  troponin has gone from 704 to 1533 to 1545 to  747 most consistent with demand ischemia and CPR and defibrillation rather than acute coronary syndrome.  Echocardiogram shows apparent low normal LV systolic function with no evidence of acute wall motion abnormalities requiring further treatment management.  Currently the patient feels better after electrolyte improvements and has had no evidence of chest pain and overall has said that his stable angina has significantly improved over the last several months with medication management.  Currently it is unclear as to the primary issues although there does not appear to be any progression cardiovascular symptoms at this time with EKG now showing normal sinus rhythm unchanged previous anterior septal myocardial infarction and telemetry showing normal sinus rhythm.  Past Medical History:  Diagnosis Date   Alcohol use    "cutting back" but still heavy and daily, multiple shots of bourbon each night   Diabetes mellitus without complication ([place])    History of tobacco use    reportedly quit in 2013   Hypertension    Pulmonary embolism ([place]) 2012   unprovoked   PVD (peripheral vascular disease) ([place])       Surgical History:  Past Surgical History:  Procedure Laterality Date   BACK SURGERY     CHOLECYSTECTOMY     CORONARY ANGIOGRAPHY N/A 01/30/2019   Procedure: CORONARY ANGIOGRAPHY;  Surgeon: Corey Skains, MD;  Location: [place] [facility];  Service: Cardiovascular;  Laterality: N/A;   ERCP W/ SPHICTEROTOMY  2015   FEMORAL ARTERY - POPLITEAL ARTERY BYPASS GRAFT Right 06/19/2018   LOWER EXTREMITY ANGIOGRAPHY Left 01/14/2021  Procedure: LOWER EXTREMITY ANGIOGRAPHY;  Surgeon: Algernon Huxley, MD;  Location: [facility];  Service: Cardiovascular;  Laterality: Left;   THROMBOENDARTERECTOMY Right 06/19/2018   TONSILLECTOMY     VASCULAR SURGERY       Home Meds: Prior to Admission medications   Medication Sig Start Date End Date Taking?  Authorizing Provider  apixaban (ELIQUIS) 5 MG TABS tablet Take 5 mg by mouth 2 (two) times daily. 06/21/18  Yes [provider]  atorvastatin (LIPITOR) 80 MG tablet Take 80 mg by mouth daily.   Yes [provider]  brimonidine (ALPHAGAN) 0.2 % ophthalmic solution Place 1 drop into both eyes 2 (two) times daily. 12/31/20  Yes [provider]  clopidogrel (PLAVIX) 75 MG tablet Take 75 mg by mouth daily. 05/08/19  Yes [provider]  diclofenac Sodium (VOLTAREN) 1 % GEL Apply 4 g topically 4 (four) times daily.   Yes [provider]  famotidine (PEPCID) 40 MG tablet Take 1 tablet by mouth every morning. 01/21/19  Yes [provider]  furosemide (LASIX) 20 MG tablet Take 40 mg by mouth daily.   Yes [provider]  glipiZIDE (GLUCOTROL XL) 10 MG 24 hr tablet Take 10 mg by mouth every morning. 01/11/21  Yes [provider]  isosorbide mononitrate (IMDUR) 30 MG 24 hr tablet Take 30 mg by mouth daily.   Yes [provider]  Latanoprost 0.005 % EMUL Apply 1 drop to eye at bedtime. 07/17/17  Yes [provider]  metoprolol succinate (TOPROL-XL) 50 MG 24 hr tablet Take 50 mg by mouth 2 (two) times a day. Take with or immediately following a meal.   Yes [provider]  omeprazole (PRILOSEC) 40 MG capsule Take 40 mg by mouth daily.   Yes [provider]  ranolazine (RANEXA) 500 MG 12 hr tablet Take 500 mg by mouth 2 (two) times daily. 05/08/19  Yes [provider]  Blood Glucose Monitoring Suppl (ONE TOUCH ULTRA 2) w/Device KIT See admin instructions. 02/25/16   [provider]  Calcipotriene-Betameth Diprop (ENSTILAR) 0.005-0.064 % FOAM APPLY THIN LAYER TO AFFECTED AREAS ONCE DAILY UNTIL FLAT AND ASYMPTOMATIC, MAX 4 WEEKS 08/12/19   [provider]  cephALEXin (KEFLEX) 500 MG capsule Take 500 mg by mouth 3 (three) times daily. Patient not taking: No sig reported    [provider]  clonazePAM (KLONOPIN) 0.5 MG tablet Take 0.25-0.5 mg by mouth 3 (three) times daily as needed for anxiety. Patient not taking: No sig reported    [provider]  DULoxetine (CYMBALTA) 60 MG capsule Take 1 capsule (60 mg total) by mouth 2 (two) times daily. Patient not taking: No sig reported 01/30/19   Vaughan Basta, MD  esomeprazole (NEXIUM) 40 MG capsule Take 1 capsule by mouth daily. Patient not taking: No sig reported 12/03/18   [provider]  HYDROcodone-acetaminophen (NORCO/VICODIN) 5-325 MG tablet Take 0.5-1 tablets by mouth 2 (two) times daily as needed for pain. Patient not taking: No sig reported 01/22/19   [provider]  ketoconazole (NIZORAL) 2 % shampoo Apply topically. 09/18/20   [provider]  Magnesium 250 MG TABS Take by mouth. Patient not taking: No sig reported    [provider]  midodrine (PROAMATINE) 2.5 MG tablet Take 2.5 mg by mouth daily.    [provider]  nitroGLYCERIN (NITROSTAT) 0.4 MG SL tablet Place 0.4 mg under the tongue every 5 (five) minutes as needed for chest pain.  [provider]  [facility] ULTRA test strip 2 (two) times daily. 12/19/20   [provider]  ramipril (ALTACE) 2.5 MG capsule Take by mouth. 03/14/19 03/13/20  [provider]  SENNOSIDES PO Take by mouth. Patient not taking: No sig reported    [provider]  spironolactone (ALDACTONE) 25 MG tablet Take 25 mg by mouth. 02/12/19 02/12/20  [provider]  sucralfate (CARAFATE) 1 g tablet Take 1 tablet by mouth 4 (four) times daily -  before meals and at bedtime. Patient not taking: No sig reported 01/23/19   [provider]    Inpatient Medications:   apixaban  5 mg Oral BID   Chlorhexidine Gluconate Cloth  6 each Topical Daily   clopidogrel  75 mg Oral Daily   docusate sodium  100 mg Oral BID   folic acid  1 mg Oral Daily   guaiFENesin  600 mg Oral BID    insulin aspart  0-15 Units Subcutaneous TID AC & HS   levalbuterol  0.63 mg Nebulization Once   multivitamin with minerals  1 tablet Oral Daily   pantoprazole (PROTONIX) IV  40 mg Intravenous QHS   [START ON 06/23/2021] thiamine injection  100 mg Intravenous Daily    sodium chloride     ceFEPime (MAXIPIME) IV 2 g (06/20/21 1322)   norepinephrine (LEVOPHED) Adult infusion Stopped (06/19/21 0948)   thiamine injection Stopped (06/19/21 2201)    Allergies:  Allergies  Allergen Reactions   Amoxicillin Other (See Comments)    Abdominal cramps Other reaction(s): Other (See Comments) Abdominal cramps Tolerated ancef 7/16   Hydromorphone Hcl     Quick & severe nausea/vomiting  Other reaction(s): Vomiting Quick & severe nausea/vomiting    Aspirin     Other reaction(s): Other (See Comments) 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  Is on NOAC for now.  No ASA with it. Not allergic per patient.     Social History   Socioeconomic History   Marital status: Married    Spouse name: Not on file   Number of children: Not on file   Years of education: Not on file   Highest education level: Not on file  Occupational History   Not on file  Tobacco Use   Smoking status: Former    Packs/day: 2.00    Years: 40.00    Pack years: 80.00    Types: Cigarettes    Quit date: 11/27/2010    Years since quitting: 10.5   Smokeless tobacco: Never   Tobacco comments:    Quit in 2012  Substance and Sexual Activity   Alcohol use: Yes    Alcohol/week: 7.0 standard drinks    Types: 7 Shots of liquor per week   Drug use: Never   Sexual activity: Not on file  Other Topics Concern   Not on file  Social History Narrative   Not on file   Social Determinants of Health   Financial Resource Strain: Not on file  Food Insecurity: Not on file  Transportation Needs: Not on file  Physical Activity: Not on file  Stress: Not on file  Social Connections: Not on file  Intimate Partner Violence: Not on file      Family History  Problem Relation Age of Onset   Hypertension Mother    Parkinson's disease Mother    Heart attack Father    Vascular Disease Father    AAA (abdominal aortic aneurysm) Father    Alzheimer's disease Father  Review of Systems Positive for dizziness syncope Negative for: General:  chills, fever, night sweats or weight changes.  Cardiovascular: PND orthopnea positive for syncope dizziness  Dermatological skin lesions rashes Respiratory: Cough congestion Urologic: Frequent urination urination at night and hematuria Abdominal: negative for nausea, vomiting, diarrhea, bright red blood per rectum, melena, or hematemesis Neurologic: negative for visual changes, and/or hearing changes  All other systems reviewed and are otherwise negative except as noted above.  Labs: No results for input(s): CKTOTAL, CKMB, TROPONINI in the last 72 hours. Lab Results  Component Value Date   WBC 7.9 06/20/2021   HGB 11.3 (L) 06/20/2021   HCT 33.8 (L) 06/20/2021   MCV 96.6 06/20/2021   PLT 136 (L) 06/20/2021    Recent Labs  Lab 06/20/21 0225 06/20/21 1625  NA 136  --   K 3.0* 3.4*  CL 103  --   CO2 26  --   BUN 17  --   CREATININE 1.34*  --   CALCIUM 8.0*  --   PROT 6.2*  --   BILITOT 1.0  --   ALKPHOS 48  --   ALT 34  --   AST 30  --   GLUCOSE 108*  --    Lab Results  Component Value Date   CHOL 143 01/29/2019   HDL 33 (L) 01/29/2019   LDLCALC 93 01/29/2019   TRIG 87 01/29/2019   No results found for: DDIMER  Radiology/Studies:  DG Abdomen 1 View  Result Date: 06/18/2021 CLINICAL DATA:  Orogastric tube placement EXAM: ABDOMEN - 1 VIEW COMPARISON:  6:26 p.m. FINDINGS: Orogastric tube tip now overlies the gastric fundus. Visualized lung bases are clear. No free intraperitoneal gas. The abdominal gas pattern is indeterminate due to a paucity of intra-abdominal gas. Pelvis excluded from view. IMPRESSION: Orogastric tube tip within the gastric fundus. Electronically  Signed   By: Fidela Salisbury M.D.   On: 06/18/2021 21:15   DG Abd 1 View  Result Date: 06/18/2021 CLINICAL DATA:  Encounter for NG tube placement EXAM: ABDOMEN - 1 VIEW COMPARISON:  None. FINDINGS: Defibrillator pads overlie the upper abdomen. There is no evidence of bowel obstruction. There is no visible NG tube. The NG tube appears to be overlying the midesophagus on separately dictated chest radiograph. Prior L3-L5 fusion. No acute osseous abnormality. Bilateral iliac stents IMPRESSION: No visible nasogastric tube overlying the abdomen. See separately dictated chest radiograph. Electronically Signed   By: Maurine Simmering M.D.   On: 06/18/2021 18:53   CT HEAD WO CONTRAST (5MM)  Result Date: 06/18/2021 CLINICAL DATA:  Mental status change.  Patient intubated EXAM: CT HEAD WITHOUT CONTRAST TECHNIQUE: Contiguous axial images were obtained from the base of the skull through the vertex without intravenous contrast. COMPARISON:  None. FINDINGS: Brain: Hypodensity left parietal lobe involving cortex and white matter compatible with chronic infarct. Patchy white matter hypodensity bilaterally likely chronic. Negative for hydrocephalus. Negative for acute infarct, hemorrhage or mass. 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 Electronically Signed   By: Franchot Gallo M.D.   On: 06/18/2021 16:20   CT Angio Chest PE W and/or Wo Contrast  Addendum Date: 06/19/2021   ADDENDUM REPORT: 06/19/2021 10:11 ADDENDUM: Upon further review, there is a mildly displaced acute fracture of the right 6th rib anteriorly (image 64/6). In addition, there are possible nondisplaced fractures the right anterior 4th, 5th  and 7th ribs. There is also a probable fracture of the left 6th rib anteriorly. No pneumothorax or pleural effusion. These results were discussed by  telephone at the time of interpretation on 06/19/2021 at 09:55 am with provider Derrill Kay, who verbally acknowledged these results. Electronically Signed   By: Richardean [place] M.D.   On: 06/19/2021 10:11   Result Date: 06/19/2021 CLINICAL DATA:  PE suspected, high probability.  Intubated patient. EXAM: CT ANGIOGRAPHY CHEST WITH CONTRAST TECHNIQUE: Multidetector CT imaging of the chest was performed using the standard protocol during bolus administration of intravenous contrast. Multiplanar CT image reconstructions and MIPs were obtained to evaluate the vascular anatomy. CONTRAST:  70m OMNIPAQUE IOHEXOL 350 MG/ML SOLN COMPARISON:  Radiographs 01/28/2019. FINDINGS: Cardiovascular: The pulmonary arteries are well opacified with contrast to the level of the subsegmental branches. There is no evidence of acute pulmonary embolism. There is essentially no contrast opacification of the systemic arterial system. Patient is status post median sternotomy and CABG. There is diffuse atherosclerosis of the aorta, great vessels and coronary arteries. There is some reflux of contrast into the IVC and hepatic veins. The heart size is normal. There is no pericardial effusion. Mediastinum/Nodes: Tip of the endotracheal tube is in the mid trachea. There are no enlarged mediastinal, hilar or axillary lymph nodes. The thyroid gland, trachea and esophagus demonstrate no significant findings. Lungs/Pleura: There is no pleural effusion. Mild centrilobular emphysema with dependent pulmonary opacities bilaterally, favoring atelectasis. No consolidation or suspicious pulmonary nodularity. Triangular-shaped 4 mm right middle lobe nodule on image 47/6, likely benign. Upper abdomen: As above, there is some reflux of contrast into the IVC and hepatic veins. The liver contours are mildly irregular. The gallbladder is surgically absent. Musculoskeletal/Chest wall: There is no chest wall mass or suspicious osseous finding. Previous median  sternotomy. Multilevel thoracic spondylosis. Review of the MIP images confirms the above findings. IMPRESSION: 1. No evidence of acute pulmonary embolism or other acute chest process. 2. Diffuse coronary and Aortic Atherosclerosis (ICD10-I70.0). Previous CABG. 3. Mild emphysema with probable mild dependent atelectasis in both lungs. 4. Potential changes of early cirrhosis. Electronically Signed: By: WRichardean SaleM.D. On: 06/18/2021 16:20   CT FEMUR RIGHT WO CONTRAST  Result Date: 06/18/2021 CLINICAL DATA:  Recent insect bite with fever and right lower extremity pain and edema. EXAM: CT OF THE RIGHT FEMUR, LOWER LEG AND FOOT WITHOUT CONTRAST TECHNIQUE: Multidetector CT imaging of the right lower extremity was performed according to the standard protocol. The images extend from the lower right pelvis through the right foot. Multiplanar CT image reconstructions were also generated. COMPARISON:  None. FINDINGS: Bones/Joint/Cartilage No evidence of acute fracture or dislocation. Status post proximal right femoral dynamic plate and screw fixation of an intertrochanteric femur fracture. The hardware is intact without loosening. There is irregular periosteal ossification anteriorly, likely chronic and related to the original injury. The distal femur, tibia, fibula and foot demonstrate no significant findings. No large joint effusions are seen. Ligaments Suboptimally assessed by CT. Muscles and Tendons No focal muscular atrophy or fluid collection identified. The extensor mechanism is intact at the knee. The ankle tendons appear intact and normally located. Soft tissues Bilateral external iliac stents are noted with postsurgical changes from right femoropopliteal bypass grafting. There is extensive atherosclerosis of the underlying native arteries in the right lower extremity. Foley catheter is in place with mild bladder wall thickening. There is contrast in the bladder related to the preceding chest CTA. Postsurgical  changes are present in the  subcutaneous fat lateral to the proximal right femur, attributed to the previous ORIF. No inflammatory changes or fluid collections are seen within the thigh. There is diffuse subcutaneous edema within the lower leg, greatest distally. No focal fluid collection, soft tissue emphysema or foreign body identified. This edema extends into the foot. IMPRESSION: 1. Diffuse subcutaneous edema within the lower leg and foot consistent with soft tissue infection (cellulitis). No focal fluid collection or unexpected foreign body identified. 2. No evidence of deep soft tissue infection, septic arthritis or osteomyelitis. 3. Previous right proximal femoral ORIF without acute osseous findings. 4. Extensive atherosclerosis post right femoropopliteal bypass grafting. Electronically Signed   By: Richardean [place] M.D.   On: 06/18/2021 20:52   CT TIBIA FIBULA RIGHT WO CONTRAST  Result Date: 06/18/2021 CLINICAL DATA:  Recent insect bite with fever and right lower extremity pain and edema. EXAM: CT OF THE RIGHT FEMUR, LOWER LEG AND FOOT WITHOUT CONTRAST TECHNIQUE: Multidetector CT imaging of the right lower extremity was performed according to the standard protocol. The images extend from the lower right pelvis through the right foot. Multiplanar CT image reconstructions were also generated. COMPARISON:  None. FINDINGS: Bones/Joint/Cartilage No evidence of acute fracture or dislocation. Status post proximal right femoral dynamic plate and screw fixation of an intertrochanteric femur fracture. The hardware is intact without loosening. There is irregular periosteal ossification anteriorly, likely chronic and related to the original injury. The distal femur, tibia, fibula and foot demonstrate no significant findings. No large joint effusions are seen. Ligaments Suboptimally assessed by CT. Muscles and Tendons No focal muscular atrophy or fluid collection identified. The extensor mechanism is intact at the knee.  The ankle tendons appear intact and normally located. Soft tissues Bilateral external iliac stents are noted with postsurgical changes from right femoropopliteal bypass grafting. There is extensive atherosclerosis of the underlying native arteries in the right lower extremity. Foley catheter is in place with mild bladder wall thickening. There is contrast in the bladder related to the preceding chest CTA. Postsurgical changes are present in the subcutaneous fat lateral to the proximal right femur, attributed to the previous ORIF. No inflammatory changes or fluid collections are seen within the thigh. There is diffuse subcutaneous edema within the lower leg, greatest distally. No focal fluid collection, soft tissue emphysema or foreign body identified. This edema extends into the foot. IMPRESSION: 1. Diffuse subcutaneous edema within the lower leg and foot consistent with soft tissue infection (cellulitis). No focal fluid collection or unexpected foreign body identified. 2. No evidence of deep soft tissue infection, septic arthritis or osteomyelitis. 3. Previous right proximal femoral ORIF without acute osseous findings. 4. Extensive atherosclerosis post right femoropopliteal bypass grafting. Electronically Signed   By: Richardean [place] M.D.   On: 06/18/2021 20:52   CT FOOT RIGHT WO CONTRAST  Result Date: 06/18/2021 CLINICAL DATA:  Recent insect bite with fever and right lower extremity pain and edema. EXAM: CT OF THE RIGHT FEMUR, LOWER LEG AND FOOT WITHOUT CONTRAST TECHNIQUE: Multidetector CT imaging of the right lower extremity was performed according to the standard protocol. The images extend from the lower right pelvis through the right foot. Multiplanar CT image reconstructions were also generated. COMPARISON:  None. FINDINGS: Bones/Joint/Cartilage No evidence of acute fracture or dislocation. Status post proximal right femoral dynamic plate and screw fixation of an intertrochanteric femur fracture. The  hardware is intact without loosening. There is irregular periosteal ossification anteriorly, likely chronic and related to the original injury. The distal femur, tibia, fibula and  foot demonstrate no significant findings. No large joint effusions are seen. Ligaments Suboptimally assessed by CT. Muscles and Tendons No focal muscular atrophy or fluid collection identified. The extensor mechanism is intact at the knee. The ankle tendons appear intact and normally located. Soft tissues Bilateral external iliac stents are noted with postsurgical changes from right femoropopliteal bypass grafting. There is extensive atherosclerosis of the underlying native arteries in the right lower extremity. Foley catheter is in place with mild bladder wall thickening. There is contrast in the bladder related to the preceding chest CTA. Postsurgical changes are present in the subcutaneous fat lateral to the proximal right femur, attributed to the previous ORIF. No inflammatory changes or fluid collections are seen within the thigh. There is diffuse subcutaneous edema within the lower leg, greatest distally. No focal fluid collection, soft tissue emphysema or foreign body identified. This edema extends into the foot. IMPRESSION: 1. Diffuse subcutaneous edema within the lower leg and foot consistent with soft tissue infection (cellulitis). No focal fluid collection or unexpected foreign body identified. 2. No evidence of deep soft tissue infection, septic arthritis or osteomyelitis. 3. Previous right proximal femoral ORIF without acute osseous findings. 4. Extensive atherosclerosis post right femoropopliteal bypass grafting. Electronically Signed   By: Richardean Sale M.D.   On: 06/18/2021 20:52   DG Chest Port 1 View  Result Date: 06/18/2021 CLINICAL DATA:  Encounter for ET tube and OG tube placement EXAM: PORTABLE CHEST 1 VIEW COMPARISON:  Same day CT chest FINDINGS: Endotracheal tube overlies the midthoracic trachea. There is an  orogastric tube with tip overlying the mid esophagus. Fibular pads overlie the left lower chest. Enlarged cardiac silhouette. Bibasilar subsegmental atelectasis. There is no focal airspace consolidation. There is no large pleural effusion or visible pneumothorax. There is no acute osseous abnormality. IMPRESSION: Endotracheal tube tip overlies the midthoracic trachea. Orogastric tube tip overlies the mid esophagus, recommend advancement. Electronically Signed   By: Maurine Simmering M.D.   On: 06/18/2021 18:56   ECHOCARDIOGRAM COMPLETE  Result Date: 06/19/2021    ECHOCARDIOGRAM REPORT   Patient Name:   Damon English Date of Exam: 06/19/2021 Medical Rec #:  [id number]        Height:       65.0 in Accession #:    [id number]       Weight:       214.3 lb Date of Birth:  09-May-1951         BSA:          2.037 m Patient Age:    13 years         BP:           124/45 mmHg Patient Gender: M                HR:           65 bpm. Exam Location:  [facility] Procedure: 2D Echo and Intracardiac Opacification Agent Indications:     Cardiac arrest I46.9  History:         Patient has prior history of Echocardiogram examinations, most                  recent 01/28/2019.  Sonographer:     Kathlen [place] RDCS Referring Phys:  [id number] [place] GRAVES Diagnosing Phys: Kate Sable MD  Sonographer Comments: Technically challenging study due to limited acoustic windows and echo performed with patient supine and on artificial respirator. IMPRESSIONS  1. Left ventricular ejection fraction, by estimation, is  50%. The left ventricle has low normal function. The left ventricle has no regional wall motion abnormalities. Left ventricular diastolic function could not be evaluated.  2. Right ventricular systolic function is low normal. The right ventricular size is not well visualized.  3. The mitral valve is normal in structure. No evidence of mitral valve regurgitation.  4. The aortic valve is grossly normal. Aortic valve regurgitation is not  visualized. FINDINGS  Left Ventricle: Left ventricular ejection fraction, by estimation, is 50%. The left ventricle has low normal function. The left ventricle has no regional wall motion abnormalities. Definity contrast agent was given IV to delineate the left ventricular endocardial borders. The left ventricular internal cavity size was normal in size. There is no left ventricular hypertrophy. Left ventricular diastolic function could not be evaluated. Right Ventricle: The right ventricular size is not well visualized. No increase in right ventricular wall thickness. Right ventricular systolic function is low normal. Left Atrium: Left atrial size was normal in size. Right Atrium: Right atrial size was not well visualized. Pericardium: There is no evidence of pericardial effusion. Mitral Valve: The mitral valve is normal in structure. No evidence of mitral valve regurgitation. Tricuspid Valve: The tricuspid valve is not well visualized. Tricuspid valve regurgitation is not demonstrated. Aortic Valve: The aortic valve is grossly normal. Aortic valve regurgitation is not visualized. Pulmonic Valve: The pulmonic valve was not well visualized. Pulmonic valve regurgitation is not visualized. Aorta: The aortic root is normal in size and structure. Venous: IVC assessment for right atrial pressure unable to be performed due to mechanical ventilation. IAS/Shunts: The interatrial septum was not well visualized.  LEFT VENTRICLE PLAX 2D LVIDd:         5.40 cm LVIDs:         4.20 cm LV PW:         1.10 cm LV IVS:        1.00 cm LVOT diam:     2.20 cm LVOT Area:     3.80 cm  LEFT ATRIUM         Index LA diam:    3.70 cm 1.82 cm/m   AORTA Ao Root diam: 3.40 cm Ao Asc diam:  3.40 cm  SHUNTS Systemic Diam: 2.20 cm Kate Sable MD Electronically signed by Kate Sable MD Signature Date/Time: 06/19/2021/3:43:59 PM    Final    US Abdomen Limited RUQ (LIVER/GB)  Result Date: 06/18/2021 CLINICAL DATA:  Elevated liver  enzymes EXAM: ULTRASOUND ABDOMEN LIMITED RIGHT UPPER QUADRANT COMPARISON:  None. FINDINGS: Gallbladder: Absent Common bile duct: Diameter: 5 mm in proximal diameter Liver: The hepatic parenchymal echogenicity is diffusely increased, the echotexture is mildly coarsened, and there is poor acoustic through transmission most in keeping with changes of moderate hepatic steatosis. No focal intrahepatic masses are identified. There is no intrahepatic biliary ductal dilation. Portal vein is patent on color Doppler imaging with normal direction of blood flow towards the liver. Other: No ascites IMPRESSION: Status post cholecystectomy. Moderate hepatic steatosis. Electronically Signed   By: Fidela Salisbury M.D.   On: 06/18/2021 20:03    EKG: Normal sinus rhythm anteroseptal infarct  Weights: Filed Weights   06/18/21 2100 06/19/21 0444 06/20/21 0410  Weight: 97.2 kg 97.2 kg 98.8 kg     Physical Exam: Blood pressure (!) 149/62, pulse 96, temperature 98.8 F (37.1 C), temperature source Oral, resp. rate (!) 25, height 5' 5" (1.651 m), weight 98.8 kg, SpO2 95 %. Body mass index is 36.25 kg/m. General: Well developed,  well nourished, in no acute distress. Head eyes ears nose throat: Normocephalic, atraumatic, sclera non-icteric, no xanthomas, nares are without discharge. No apparent thyromegaly and/or mass  Lungs: Normal respiratory effort.  no wheezes, no rales, no rhonchi.  Heart: RRR with normal S1 S2. no murmur gallop, no rub, PMI is normal size and placement, carotid upstroke normal without bruit, jugular venous pressure is normal Abdomen: Soft, non-tender, non-distended with normoactive bowel sounds. No hepatomegaly. No rebound/guarding. No obvious abdominal masses. Abdominal aorta is normal size without bruit Extremities: Trace days edema. no cyanosis, no clubbing, no ulcers  Peripheral : 2+ bilateral upper extremity pulses, 2+ bilateral femoral pulses, 1+ bilateral dorsal pedal pulse Neuro: Alert and  oriented. No facial asymmetry. No focal deficit. Moves all extremities spontaneously. Musculoskeletal: Normal muscle tone without kyphosis Psych:  Responds to questions appropriately with a normal affect.    Assessment: 70 year old male with known coronary disease status post myocardial infarction PCI and stent placement coronary bypass grafting severe peripheral vascular disease pulmonary embolism with acute kidney injury with an apparent cardiac arrest with hypokalemia hypotension and possible demand ischemia elevation of troponin without evidence of apparent acute coronary syndrome  Plan: 1.  Continue medical management of possible lower extremity infection and/or sepsis contributing to above 2.  Currently holding off on acute intervention from the cardiovascular standpoint until further evaluation and improvements of above 3.  Slow reinstatement of previous medication management for coronary artery disease LV systolic dysfunction and pulmonary embolism 4.  Further consideration of cardiac catheterization if other concerns need for treatment  Signed, Corey Skains M.D. [facility] 06/20/2021, 4:49 PM

## 2021-06-20 NOTE — Consult Note (Signed)
PHARMACY CONSULT NOTE - FOLLOW UP  Pharmacy Consult for Electrolyte Monitoring and Replacement   Recent Labs: Potassium (mmol/L)  Date Value  06/20/2021 3.0 (L)  11/08/2013 3.4 (L)   Magnesium (mg/dL)  Date Value  06/20/2021 2.0   Calcium (mg/dL)  Date Value  06/20/2021 8.0 (L)   Calcium, Total (mg/dL)  Date Value  11/08/2013 9.3   Albumin (g/dL)  Date Value  06/20/2021 2.8 (L)   Phosphorus (mg/dL)  Date Value  06/20/2021 2.4 (L)   Sodium (mmol/L)  Date Value  06/20/2021 136  11/08/2013 136     Assessment: 70 yo male presented to [facility] ER on 09/2 following a cardiac arrest.  Per ER notes pts wife reported while the pt was leaving his outpatient doctors office he felt like he was going to pass out.  He sat on his walker and lost consciousness.  Pts wife reported he briefly regained consciousness several times EMS notified.  When EMS arrived pt unresponsive and had a pulse, but required defibrillation x2. On a ventilator.   On LR @ 150 ml/hr.   Pt received Kcl 10 mEq IV x 2 and KCL PO 40 mEq x 1.   Goal of Therapy:  WNL  Plan:  Medical team ordered KCl 10 mEq IV x 4 + Kcl 40 mEq x 1  Follow up with AM labs.   Oswald Hillock ,PharmD, BCPS Clinical Pharmacist 06/20/2021 8:56 AM

## 2021-06-20 NOTE — Progress Notes (Signed)
PROGRESS NOTE    Damon English  [id number] DOB: 1951/02/02 DOA: 06/18/2021 PCP: [name], PA-C    Brief Narrative:  This is a 70 yo male presented to [facility] ER on 09/2 following a cardiac arrest.  Per ER notes pts wife reported while the pt was leaving his outpatient doctors office he felt like he was going to pass out.  He sat on his walker and lost consciousness.  Pts wife reported he briefly regained consciousness several times EMS notified.  When EMS arrived pt unresponsive and had a pulse, but required defibrillation x2.  En route to the ER he required bag mask ventilation.     ED course Upon arrival to the ER pt remained minimally responsive (opening eyes and moaning), however unable to follow commands.  Pt mechanically intubated per ED provider.  Pt hypertensive, however EKG x2 revealed complete heart block hr 50's and no concerning ST changes.  Cardiologist Dr. Fletcher Anon contacted by ER physician and given pts initial heart block rate remain in the 50's and pt remained hypertensive there was no indication for cardiac intervention, recommended continued medical management.  CT Head negative for acute abnormality and CTA Chest negative for PE, however concerning for early cirrhosis.  Lab results revealed K+ 2.2, chloride 91, glucose 240, creatinine 1.71, AST 99, ALT 77, troponin 52, wbc 12.0, and Respiratory panel by RT-PCR negative.  Pt also febrile temp 100/1~100.5 degrees F with right lower extremity warm to touch along with 2+ edema.  He received azactam, vancomycin, and doxycycline.    According to pts wife they went camping over the weekend and pt reported a right flank unknown insect bite.  Following camping trip on 08/31 pts wife stated he began having fevers and right lower extremity pain.  He saw his outpatient vascular physician on 09/2 for evaluation of symptoms and all test came back negative.     PCCM team contacted by ER physician for ICU admission and normothermia protocol  initiated.   9/4 k 3.0. PCCM pickup by [facility] today  Consultants:  pccm  Procedures:  CT Head 09/2>>No acute abnormality Chronic infarct left parietal lobe. Chronic microvascular ischemic change in the white matter. CTA Chest 09/2>>No evidence of acute pulmonary embolism or other acute chest process. Diffuse coronary and Aortic Atherosclerosis (ICD10-I70.0). Previous CABG. Mild emphysema with probable mild dependent atelectasis in both lungs. Potential changes of early cirrhosis.  Mildly displaced acute fracture of the right sixth rib clearly nondisplaced fractures of the right anterior fourth fifth and seventh ribs without pneumothorax or pleural effusion.  This is likely a result from CPR. CT Femur Right 09/2>>previous right proximal femoral ORIF CT Foot Right 09/2>> changes consistent with cellulitis CT Tibia Fibular Right 09/2>> extensive atherosclerosis post right femoral-popliteal bypass grafting   Antimicrobials:  Respiratory panel by RT-PCR 09/2>>negative  Blood x2 09/2>> Urine 09/2>> Lyme disease serology w/reflex 09/2>> [place] spotted fvr abs pnl (IgG+IgM) 09/2>>   Azactam 09/2 x1 dose Doxycycline 09/2>> Vancomycin 09/2>> 9/3 Cefepime 9/3>>       Subjective: Doesn't remember what happened. Denies sob or cp. Has some cough from intubation per pt no trouble swallowing  Objective: Vitals:   06/20/21 1100 06/20/21 1200 06/20/21 1300 06/20/21 1400  BP: (!) 160/68 (!) 137/124  (!) 172/66  Pulse: 92 91 94 (!) 108  Resp: (!) 25 19 (!) 22 (!) 23  Temp:  98.7 F (37.1 C)    TempSrc:  Oral    SpO2: 97% 99% 99% 99%  Weight:      Height:        Intake/Output Summary (Last 24 hours) at 06/20/2021 1429 Last data filed at 06/20/2021 1300 Gross per 24 hour  Intake 1683.8 ml  Output 847 ml  Net 836.8 ml   Filed Weights   06/18/21 2100 06/19/21 0444 06/20/21 0410  Weight: 97.2 kg 97.2 kg 98.8 kg    Examination:  General exam: Appears calm and comfortable  Respiratory  system: Clear to auscultation. Respiratory effort normal. Cardiovascular system: S1 & S2 heard, RRR. No JVD, murmurs, rubs, gallops or clicks. Gastrointestinal system: Abdomen is nondistended, soft and nontender. . Normal bowel sounds heard. Central nervous system: awake and alert Extremities:mild edema Psychiatry:  Mood & affect appropriate.     Data Reviewed: I have personally reviewed following labs and imaging studies  CBC: Recent Labs  Lab 06/18/21 1513 06/18/21 2100 06/19/21 0630 06/20/21 0225  WBC 12.0* 10.6* 10.9* 7.9  NEUTROABS 8.5*  --   --   --   HGB 14.5 14.6 12.3* 11.3*  HCT 43.4 42.6 34.6* 33.8*  MCV 91.9 93.2 92.0 96.6  PLT 201 173 156 [id number]*   Basic Metabolic Panel: Recent Labs  Lab 06/18/21 1513 06/18/21 1731 06/18/21 2100 06/18/21 2358 06/19/21 0630 06/19/21 1657 06/20/21 0225  NA 135  --   --   --  139  --  136  K 2.2*  --   --  2.4* 2.8* 2.9* 3.0*  CL 91*  --   --   --  101  --  103  CO2 29  --   --   --  28  --  26  GLUCOSE 240*  --   --   --  147*  --  108*  BUN 20  --   --   --  21  --  17  CREATININE 1.71*  --  1.64*  --  1.48*  --  1.34*  CALCIUM 9.5  --   --   --  8.5*  --  8.0*  MG  --  1.8  --   --  1.7 2.1 2.0  PHOS  --   --   --   --  3.3  --  2.4*   GFR: Estimated Creatinine Clearance: 57 mL/min (A) (by C-G formula based on SCr of 1.34 mg/dL (H)). Liver Function Tests: Recent Labs  Lab 06/18/21 1513 06/19/21 0630 06/20/21 0225  AST 99* 41 30  ALT 77* 42 34  ALKPHOS 68 46 48  BILITOT 1.4* 0.8 1.0  PROT 8.4* 5.7* 6.2*  ALBUMIN 4.1 2.4* 2.8*   No results for input(s): LIPASE, AMYLASE in the last 168 hours. No results for input(s): AMMONIA in the last 168 hours. Coagulation Profile: Recent Labs  Lab 06/18/21 1731  INR 1.6*   Cardiac Enzymes: No results for input(s): CKTOTAL, CKMB, CKMBINDEX, TROPONINI in the last 168 hours. BNP (last 3 results) No results for input(s): PROBNP in the last 8760 hours. HbA1C: Recent Labs     06/18/21 2100  HGBA1C 6.1*   CBG: Recent Labs  Lab 06/19/21 1630 06/19/21 1817 06/19/21 1951 06/20/21 0739 06/20/21 1200  GLUCAP 89 76 116* 100* 99   Lipid Profile: No results for input(s): CHOL, HDL, LDLCALC, TRIG, CHOLHDL, LDLDIRECT in the last 72 hours. Thyroid Function Tests: No results for input(s): TSH, T4TOTAL, FREET4, T3FREE, THYROIDAB in the last 72 hours. Anemia Panel: No results for input(s): VITAMINB12, FOLATE, FERRITIN, TIBC, IRON, RETICCTPCT in the last 72 hours.  Sepsis Labs: Recent Labs  Lab 06/18/21 1731 06/18/21 2000 06/18/21 2100 06/19/21 0630 06/20/21 0225  PROCALCITON  --   --  0.31 0.38 0.25  LATICACIDVEN 2.6* 2.1*  --  1.3  --     Recent Results (from the past 240 hour(s))  Resp Panel by RT-PCR (Flu A&B, Covid) Nasopharyngeal Swab     Status: None   Collection Time: 06/18/21  3:12 PM   Specimen: Nasopharyngeal Swab; Nasopharyngeal(NP) swabs in vial transport medium  Result Value Ref Range Status   SARS Coronavirus 2 by RT PCR NEGATIVE NEGATIVE Final    Comment: (NOTE) SARS-CoV-2 target nucleic acids are NOT DETECTED.  The SARS-CoV-2 RNA is generally detectable in upper respiratory specimens during the acute phase of infection. The lowest concentration of SARS-CoV-2 viral copies this assay can detect is 138 copies/mL. A negative result does not preclude SARS-Cov-2 infection and should not be used as the sole basis for treatment or other patient management decisions. A negative result may occur with  improper specimen collection/handling, submission of specimen other than nasopharyngeal swab, presence of viral mutation(s) within the areas targeted by this assay, and inadequate number of viral copies(<138 copies/mL). A negative result must be combined with clinical observations, patient history, and epidemiological information. The expected result is Negative.  Fact Sheet for Patients:  EntrepreneurPulse.com.au  Fact  Sheet for Healthcare Providers:  IncredibleEmployment.be  This test is no t yet approved or cleared by the [place] FDA and  has been authorized for detection and/or diagnosis of SARS-CoV-2 by FDA under an Emergency Use Authorization (EUA). This EUA will remain  in effect (meaning this test can be used) for the duration of the COVID-19 declaration under Section 564(b)(1) of the Act, 21 U.S.C.section 360bbb-3(b)(1), unless the authorization is terminated  or revoked sooner.       Influenza A by PCR NEGATIVE NEGATIVE Final   Influenza B by PCR NEGATIVE NEGATIVE Final    Comment: (NOTE) The Xpert Xpress SARS-CoV-2/FLU/RSV plus assay is intended as an aid in the diagnosis of influenza from Nasopharyngeal swab specimens and should not be used as a sole basis for treatment. Nasal washings and aspirates are unacceptable for Xpert Xpress SARS-CoV-2/FLU/RSV testing.  Fact Sheet for Patients: EntrepreneurPulse.com.au  Fact Sheet for Healthcare Providers: IncredibleEmployment.be  This test is not yet approved or cleared by the [place] FDA and has been authorized for detection and/or diagnosis of SARS-CoV-2 by FDA under an Emergency Use Authorization (EUA). This EUA will remain in effect (meaning this test can be used) for the duration of the COVID-19 declaration under Section 564(b)(1) of the Act, 21 U.S.C. section 360bbb-3(b)(1), unless the authorization is terminated or revoked.  Performed at [facility], [address] [place], [place] [address]   Blood Culture (routine x 2)     Status: None (Preliminary result)   Collection Time: 06/18/21  5:31 PM   Specimen: BLOOD  Result Value Ref Range Status   Specimen Description BLOOD RIGHT HAND  Final   Special Requests   Final    BOTTLES DRAWN AEROBIC AND ANAEROBIC Blood Culture adequate volume   Culture   Final    NO GROWTH < 12 HOURS Performed at [facility], [address] [place], [place] [address]    Report Status PENDING  Incomplete  Blood Culture (routine x 2)     Status: None (Preliminary result)   Collection Time: 06/18/21  5:31 PM   Specimen: BLOOD  Result Value Ref Range Status  Specimen Description BLOOD RIGHT [facility]  Final   Special Requests   Final    BOTTLES DRAWN AEROBIC AND ANAEROBIC Blood Culture adequate volume   Culture   Final    NO GROWTH < 12 HOURS Performed at [facility], [address] [place], [place] [address]    Report Status PENDING  Incomplete  Urine Culture     Status: None   Collection Time: 06/18/21  5:31 PM   Specimen: In/Out Cath Urine  Result Value Ref Range Status   Specimen Description   Final    IN/OUT CATH URINE Performed at [facility], [address], [place], [place] [address]    Special Requests   Final    NONE Performed at [facility], [address] [place], [place] [address]    Culture   Final    NO GROWTH Performed at [facility], [place] [address] [place], [place] [address]    Report Status 06/20/2021 FINAL  Final         Radiology Studies: DG Abdomen 1 View  Result Date: 06/18/2021 CLINICAL DATA:  Orogastric tube placement EXAM: ABDOMEN - 1 VIEW COMPARISON:  6:26 p.m. FINDINGS: Orogastric tube tip now overlies the gastric fundus. Visualized lung bases are clear. No free intraperitoneal gas. The abdominal gas pattern is indeterminate due to a paucity of intra-abdominal gas. Pelvis excluded from view. IMPRESSION: Orogastric tube tip within the gastric fundus. Electronically Signed   By: Fidela Salisbury M.D.   On: 06/18/2021 21:15   DG Abd 1 View  Result Date: 06/18/2021 CLINICAL DATA:  Encounter for NG tube placement EXAM: ABDOMEN - 1 VIEW COMPARISON:  None. FINDINGS: Defibrillator pads overlie the upper abdomen. There is no evidence of bowel obstruction. There is no visible NG tube. The NG tube appears to be overlying the  midesophagus on separately dictated chest radiograph. Prior L3-L5 fusion. No acute osseous abnormality. Bilateral iliac stents IMPRESSION: No visible nasogastric tube overlying the abdomen. See separately dictated chest radiograph. Electronically Signed   By: Maurine Simmering M.D.   On: 06/18/2021 18:53   CT HEAD WO CONTRAST (5MM)  Result Date: 06/18/2021 CLINICAL DATA:  Mental status change.  Patient intubated EXAM: CT HEAD WITHOUT CONTRAST TECHNIQUE: Contiguous axial images were obtained from the base of the skull through the vertex without intravenous contrast. COMPARISON:  None. FINDINGS: Brain: Hypodensity left parietal lobe involving cortex and white matter compatible with chronic infarct. Patchy white matter hypodensity bilaterally likely chronic. Negative for hydrocephalus. Negative for acute infarct, hemorrhage or mass. 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 Electronically Signed   By: Franchot Gallo M.D.   On: 06/18/2021 16:20   CT Angio Chest PE W and/or Wo Contrast  Addendum Date: 06/19/2021   ADDENDUM REPORT: 06/19/2021 10:11 ADDENDUM: Upon further review, there is a mildly displaced acute fracture of the right 6th rib anteriorly (image 64/6). In addition, there are possible nondisplaced fractures the right anterior 4th, 5th and 7th ribs. There is also a probable fracture of the left 6th rib anteriorly. No pneumothorax or pleural effusion. These results were discussed by telephone at the time of interpretation on 06/19/2021 at 09:55 am with provider Derrill Kay, who verbally acknowledged these results. Electronically Signed   By: Richardean Sale M.D.   On: 06/19/2021 10:11   Result Date: 06/19/2021 CLINICAL DATA:  PE suspected,  high probability.  Intubated patient. EXAM: CT ANGIOGRAPHY CHEST WITH CONTRAST TECHNIQUE:  Multidetector CT imaging of the chest was performed using the standard protocol during bolus administration of intravenous contrast. Multiplanar CT image reconstructions and MIPs were obtained to evaluate the vascular anatomy. CONTRAST:  18m OMNIPAQUE IOHEXOL 350 MG/ML SOLN COMPARISON:  Radiographs 01/28/2019. FINDINGS: Cardiovascular: The pulmonary arteries are well opacified with contrast to the level of the subsegmental branches. There is no evidence of acute pulmonary embolism. There is essentially no contrast opacification of the systemic arterial system. Patient is status post median sternotomy and CABG. There is diffuse atherosclerosis of the aorta, great vessels and coronary arteries. There is some reflux of contrast into the IVC and hepatic veins. The heart size is normal. There is no pericardial effusion. Mediastinum/Nodes: Tip of the endotracheal tube is in the mid trachea. There are no enlarged mediastinal, hilar or axillary lymph nodes. The thyroid gland, trachea and esophagus demonstrate no significant findings. Lungs/Pleura: There is no pleural effusion. Mild centrilobular emphysema with dependent pulmonary opacities bilaterally, favoring atelectasis. No consolidation or suspicious pulmonary nodularity. Triangular-shaped 4 mm right middle lobe nodule on image 47/6, likely benign. Upper abdomen: As above, there is some reflux of contrast into the IVC and hepatic veins. The liver contours are mildly irregular. The gallbladder is surgically absent. Musculoskeletal/Chest wall: There is no chest wall mass or suspicious osseous finding. Previous median sternotomy. Multilevel thoracic spondylosis. Review of the MIP images confirms the above findings. IMPRESSION: 1. No evidence of acute pulmonary embolism or other acute chest process. 2. Diffuse coronary and Aortic Atherosclerosis (ICD10-I70.0). Previous CABG. 3. Mild emphysema with probable mild dependent atelectasis in both lungs. 4. Potential changes of  early cirrhosis. Electronically Signed: By: WRichardean SaleM.D. On: 06/18/2021 16:20   CT FEMUR RIGHT WO CONTRAST  Result Date: 06/18/2021 CLINICAL DATA:  Recent insect bite with fever and right lower extremity pain and edema. EXAM: CT OF THE RIGHT FEMUR, LOWER LEG AND FOOT WITHOUT CONTRAST TECHNIQUE: Multidetector CT imaging of the right lower extremity was performed according to the standard protocol. The images extend from the lower right pelvis through the right foot. Multiplanar CT image reconstructions were also generated. COMPARISON:  None. FINDINGS: Bones/Joint/Cartilage No evidence of acute fracture or dislocation. Status post proximal right femoral dynamic plate and screw fixation of an intertrochanteric femur fracture. The hardware is intact without loosening. There is irregular periosteal ossification anteriorly, likely chronic and related to the original injury. The distal femur, tibia, fibula and foot demonstrate no significant findings. No large joint effusions are seen. Ligaments Suboptimally assessed by CT. Muscles and Tendons No focal muscular atrophy or fluid collection identified. The extensor mechanism is intact at the knee. The ankle tendons appear intact and normally located. Soft tissues Bilateral external iliac stents are noted with postsurgical changes from right femoropopliteal bypass grafting. There is extensive atherosclerosis of the underlying native arteries in the right lower extremity. Foley catheter is in place with mild bladder wall thickening. There is contrast in the bladder related to the preceding chest CTA. Postsurgical changes are present in the subcutaneous fat lateral to the proximal right femur, attributed to the previous ORIF. No inflammatory changes or fluid collections are seen within the thigh. There is diffuse subcutaneous edema within the lower leg, greatest distally. No focal fluid collection, soft tissue emphysema or foreign body identified. This edema extends  into the foot. IMPRESSION: 1. Diffuse subcutaneous edema within the lower leg and foot consistent with soft tissue infection (cellulitis).  No focal fluid collection or unexpected foreign body identified. 2. No evidence of deep soft tissue infection, septic arthritis or osteomyelitis. 3. Previous right proximal femoral ORIF without acute osseous findings. 4. Extensive atherosclerosis post right femoropopliteal bypass grafting. Electronically Signed   By: Richardean Sale M.D.   On: 06/18/2021 20:52   CT TIBIA FIBULA RIGHT WO CONTRAST  Result Date: 06/18/2021 CLINICAL DATA:  Recent insect bite with fever and right lower extremity pain and edema. EXAM: CT OF THE RIGHT FEMUR, LOWER LEG AND FOOT WITHOUT CONTRAST TECHNIQUE: Multidetector CT imaging of the right lower extremity was performed according to the standard protocol. The images extend from the lower right pelvis through the right foot. Multiplanar CT image reconstructions were also generated. COMPARISON:  None. FINDINGS: Bones/Joint/Cartilage No evidence of acute fracture or dislocation. Status post proximal right femoral dynamic plate and screw fixation of an intertrochanteric femur fracture. The hardware is intact without loosening. There is irregular periosteal ossification anteriorly, likely chronic and related to the original injury. The distal femur, tibia, fibula and foot demonstrate no significant findings. No large joint effusions are seen. Ligaments Suboptimally assessed by CT. Muscles and Tendons No focal muscular atrophy or fluid collection identified. The extensor mechanism is intact at the knee. The ankle tendons appear intact and normally located. Soft tissues Bilateral external iliac stents are noted with postsurgical changes from right femoropopliteal bypass grafting. There is extensive atherosclerosis of the underlying native arteries in the right lower extremity. Foley catheter is in place with mild bladder wall thickening. There is contrast  in the bladder related to the preceding chest CTA. Postsurgical changes are present in the subcutaneous fat lateral to the proximal right femur, attributed to the previous ORIF. No inflammatory changes or fluid collections are seen within the thigh. There is diffuse subcutaneous edema within the lower leg, greatest distally. No focal fluid collection, soft tissue emphysema or foreign body identified. This edema extends into the foot. IMPRESSION: 1. Diffuse subcutaneous edema within the lower leg and foot consistent with soft tissue infection (cellulitis). No focal fluid collection or unexpected foreign body identified. 2. No evidence of deep soft tissue infection, septic arthritis or osteomyelitis. 3. Previous right proximal femoral ORIF without acute osseous findings. 4. Extensive atherosclerosis post right femoropopliteal bypass grafting. Electronically Signed   By: Richardean Sale M.D.   On: 06/18/2021 20:52   CT FOOT RIGHT WO CONTRAST  Result Date: 06/18/2021 CLINICAL DATA:  Recent insect bite with fever and right lower extremity pain and edema. EXAM: CT OF THE RIGHT FEMUR, LOWER LEG AND FOOT WITHOUT CONTRAST TECHNIQUE: Multidetector CT imaging of the right lower extremity was performed according to the standard protocol. The images extend from the lower right pelvis through the right foot. Multiplanar CT image reconstructions were also generated. COMPARISON:  None. FINDINGS: Bones/Joint/Cartilage No evidence of acute fracture or dislocation. Status post proximal right femoral dynamic plate and screw fixation of an intertrochanteric femur fracture. The hardware is intact without loosening. There is irregular periosteal ossification anteriorly, likely chronic and related to the original injury. The distal femur, tibia, fibula and foot demonstrate no significant findings. No large joint effusions are seen. Ligaments Suboptimally assessed by CT. Muscles and Tendons No focal muscular atrophy or fluid collection  identified. The extensor mechanism is intact at the knee. The ankle tendons appear intact and normally located. Soft tissues Bilateral external iliac stents are noted with postsurgical changes from right femoropopliteal bypass grafting. There is extensive atherosclerosis of the underlying native arteries in  the right lower extremity. Foley catheter is in place with mild bladder wall thickening. There is contrast in the bladder related to the preceding chest CTA. Postsurgical changes are present in the subcutaneous fat lateral to the proximal right femur, attributed to the previous ORIF. No inflammatory changes or fluid collections are seen within the thigh. There is diffuse subcutaneous edema within the lower leg, greatest distally. No focal fluid collection, soft tissue emphysema or foreign body identified. This edema extends into the foot. IMPRESSION: 1. Diffuse subcutaneous edema within the lower leg and foot consistent with soft tissue infection (cellulitis). No focal fluid collection or unexpected foreign body identified. 2. No evidence of deep soft tissue infection, septic arthritis or osteomyelitis. 3. Previous right proximal femoral ORIF without acute osseous findings. 4. Extensive atherosclerosis post right femoropopliteal bypass grafting. Electronically Signed   By: Richardean Sale M.D.   On: 06/18/2021 20:52   DG Chest Port 1 View  Result Date: 06/18/2021 CLINICAL DATA:  Encounter for ET tube and OG tube placement EXAM: PORTABLE CHEST 1 VIEW COMPARISON:  Same day CT chest FINDINGS: Endotracheal tube overlies the midthoracic trachea. There is an orogastric tube with tip overlying the mid esophagus. Fibular pads overlie the left lower chest. Enlarged cardiac silhouette. Bibasilar subsegmental atelectasis. There is no focal airspace consolidation. There is no large pleural effusion or visible pneumothorax. There is no acute osseous abnormality. IMPRESSION: Endotracheal tube tip overlies the midthoracic  trachea. Orogastric tube tip overlies the mid esophagus, recommend advancement. Electronically Signed   By: Maurine Simmering M.D.   On: 06/18/2021 18:56   ECHOCARDIOGRAM COMPLETE  Result Date: 06/19/2021    ECHOCARDIOGRAM REPORT   Patient Name:   Damon English Date of Exam: 06/19/2021 Medical Rec #:  [id number]        Height:       65.0 in Accession #:    [id number]       Weight:       214.3 lb Date of Birth:  1951/04/10         BSA:          2.037 m Patient Age:    89 years         BP:           124/45 mmHg Patient Gender: M                HR:           65 bpm. Exam Location:  [facility] Procedure: 2D Echo and Intracardiac Opacification Agent Indications:     Cardiac arrest I46.9  History:         Patient has prior history of Echocardiogram examinations, most                  recent 01/28/2019.  Sonographer:     Kathlen [facility] Referring Phys:  [id number] Yznaga E GRAVES Diagnosing Phys: Kate Sable MD  Sonographer Comments: Technically challenging study due to limited acoustic windows and echo performed with patient supine and on artificial respirator. IMPRESSIONS  1. Left ventricular ejection fraction, by estimation, is 50%. The left ventricle has low normal function. The left ventricle has no regional wall motion abnormalities. Left ventricular diastolic function could not be evaluated.  2. Right ventricular systolic function is low normal. The right ventricular size is not well visualized.  3. The mitral valve is normal in structure. No evidence of mitral valve regurgitation.  4. The aortic valve is grossly normal. Aortic valve regurgitation is  not visualized. FINDINGS  Left Ventricle: Left ventricular ejection fraction, by estimation, is 50%. The left ventricle has low normal function. The left ventricle has no regional wall motion abnormalities. Definity contrast agent was given IV to delineate the left ventricular endocardial borders. The left ventricular internal cavity size was normal in size. There is no  left ventricular hypertrophy. Left ventricular diastolic function could not be evaluated. Right Ventricle: The right ventricular size is not well visualized. No increase in right ventricular wall thickness. Right ventricular systolic function is low normal. Left Atrium: Left atrial size was normal in size. Right Atrium: Right atrial size was not well visualized. Pericardium: There is no evidence of pericardial effusion. Mitral Valve: The mitral valve is normal in structure. No evidence of mitral valve regurgitation. Tricuspid Valve: The tricuspid valve is not well visualized. Tricuspid valve regurgitation is not demonstrated. Aortic Valve: The aortic valve is grossly normal. Aortic valve regurgitation is not visualized. Pulmonic Valve: The pulmonic valve was not well visualized. Pulmonic valve regurgitation is not visualized. Aorta: The aortic root is normal in size and structure. Venous: IVC assessment for right atrial pressure unable to be performed due to mechanical ventilation. IAS/Shunts: The interatrial septum was not well visualized.  LEFT VENTRICLE PLAX 2D LVIDd:         5.40 cm LVIDs:         4.20 cm LV PW:         1.10 cm LV IVS:        1.00 cm LVOT diam:     2.20 cm LVOT Area:     3.80 cm  LEFT ATRIUM         Index LA diam:    3.70 cm 1.82 cm/m   AORTA Ao Root diam: 3.40 cm Ao Asc diam:  3.40 cm  SHUNTS Systemic Diam: 2.20 cm Kate Sable MD Electronically signed by Kate Sable MD Signature Date/Time: 06/19/2021/3:43:59 PM    Final    US Abdomen Limited RUQ (LIVER/GB)  Result Date: 06/18/2021 CLINICAL DATA:  Elevated liver enzymes EXAM: ULTRASOUND ABDOMEN LIMITED RIGHT UPPER QUADRANT COMPARISON:  None. FINDINGS: Gallbladder: Absent Common bile duct: Diameter: 5 mm in proximal diameter Liver: The hepatic parenchymal echogenicity is diffusely increased, the echotexture is mildly coarsened, and there is poor acoustic through transmission most in keeping with changes of moderate hepatic  steatosis. No focal intrahepatic masses are identified. There is no intrahepatic biliary ductal dilation. Portal vein is patent on color Doppler imaging with normal direction of blood flow towards the liver. Other: No ascites IMPRESSION: Status post cholecystectomy. Moderate hepatic steatosis. Electronically Signed   By: Fidela Salisbury M.D.   On: 06/18/2021 20:03        Scheduled Meds:  apixaban  5 mg Oral BID   Chlorhexidine Gluconate Cloth  6 each Topical Daily   clopidogrel  75 mg Oral Daily   docusate sodium  100 mg Oral BID   folic acid  1 mg Oral Daily   guaiFENesin  600 mg Oral BID   insulin aspart  0-15 Units Subcutaneous TID AC & HS   levalbuterol  0.63 mg Nebulization Once   multivitamin with minerals  1 tablet Oral Daily   pantoprazole (PROTONIX) IV  40 mg Intravenous QHS   [START ON 06/23/2021] thiamine injection  100 mg Intravenous Daily   Continuous Infusions:  sodium chloride     ceFEPime (MAXIPIME) IV 2 g (06/20/21 1322)   norepinephrine (LEVOPHED) Adult infusion Stopped (06/19/21 0948)   thiamine  injection Stopped (06/19/21 2201)    Assessment & Plan:   Active Problems:   NSTEMI (non-ST elevated myocardial infarction) (HCC)   Chronic systolic CHF (congestive heart failure) (HCC)   Ischemic cardiomyopathy   Type 2 diabetes mellitus (HCC)   Cardiac arrest (HCC)   ETOH abuse   Acute respiratory failure with hypoxia and hypercapnia (HCC)   On mechanically assisted ventilation (HCC)   Cellulitis of leg, right   Sepsis ([place])   Acute kidney injury (AKI) with acute tubular necrosis (ATN) (HCC)   Acute encephalopathy   Hypokalemia   Transaminitis   Pressure injury of skin   Acute hypoxic respiratory failure post cardiac arrest  Mechanical intubation  Hx: Tobacco Abuse  S/p extubation on 9/3.  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     Acute kidney injury secondary to ATN  Hypokalemia  K is 3.0 Will supplement   Sepsis suspected secondary to tick-borne illness vs. 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  Improving [place] abd with moderate hepatic steatosis   Type II diabetes mellitus  - Hemoglobin A1c 8.1 - CBG's q4hrs  - SSI    Acute encephalopathy resolving ~CT Head 09/2 negative for acute abnormality  - EEG not needed now as the patient appears to be clearing clouded sensorium - Discontinue normothermic protocol patient complaining of being cold -  clearly improving   History of alcohol use -Monitor for potential DTs -Correct electrolytes -Supplement thiamine and folate   Rib fractures due to CPR -Currently asymptomatic -Pain control as needed     DVT prophylaxis: scd Code Status:full Family Communication: extensive conversation with wife about pts medical status. Concerned that he appears to not remember as she had to repeat answers as pt kept asking same questions. D/w wife likely due to cardiac arrest. May take some time for him to get back to baseline or close  to it. She asked about eeg, explained no need at this time. Will monitor if necessary next 1-2 days will consult neurology Disposition Plan:  Status is: Inpatient  Remains inpatient appropriate because:Inpatient level of care appropriate due to severity of illness  Dispo: The patient is from: Home              Anticipated d/c is to: Home              Patient currently is not medically stable to d/c.   Difficult to place patient No            LOS: 2 days   Time spent: 35 minutes with more than 50% on [place], MD [facility] Pager  [phone]  If 7PM-7AM, please contact night-coverage 06/20/2021, 2:29 PM

## 2021-06-20 NOTE — Progress Notes (Signed)
Pt. Suctionned nasally for small amount of thick brownish secretions. Pt. Tolerated well.

## 2021-06-21 ENCOUNTER — Inpatient Hospital Stay: Payer: BC Managed Care – PPO

## 2021-06-21 ENCOUNTER — Encounter: Payer: Self-pay | Admitting: Internal Medicine

## 2021-06-21 LAB — CBC
HCT: 30.9 % — ABNORMAL LOW (ref 39.0–52.0)
Hemoglobin: 10.4 g/dL — ABNORMAL LOW (ref 13.0–17.0)
MCH: 32.2 pg (ref 26.0–34.0)
MCHC: 33.7 g/dL (ref 30.0–36.0)
MCV: 95.7 fL (ref 80.0–100.0)
Platelets: 149 10*3/uL — ABNORMAL LOW (ref 150–400)
RBC: 3.23 MIL/uL — ABNORMAL LOW (ref 4.22–5.81)
RDW: 17.2 % — ABNORMAL HIGH (ref 11.5–15.5)
WBC: 7 10*3/uL (ref 4.0–10.5)
nRBC: 0 % (ref 0.0–0.2)

## 2021-06-21 LAB — HEPATIC FUNCTION PANEL
ALT: 23 U/L (ref 0–44)
AST: 19 U/L (ref 15–41)
Albumin: 2.6 g/dL — ABNORMAL LOW (ref 3.5–5.0)
Alkaline Phosphatase: 45 U/L (ref 38–126)
Bilirubin, Direct: 0.2 mg/dL (ref 0.0–0.2)
Indirect Bilirubin: 0.8 mg/dL (ref 0.3–0.9)
Total Bilirubin: 1 mg/dL (ref 0.3–1.2)
Total Protein: 5.9 g/dL — ABNORMAL LOW (ref 6.5–8.1)

## 2021-06-21 LAB — BASIC METABOLIC PANEL
Anion gap: 7 (ref 5–15)
BUN: 15 mg/dL (ref 8–23)
CO2: 22 mmol/L (ref 22–32)
Calcium: 8.2 mg/dL — ABNORMAL LOW (ref 8.9–10.3)
Chloride: 108 mmol/L (ref 98–111)
Creatinine, Ser: 1.15 mg/dL (ref 0.61–1.24)
GFR, Estimated: >60 mL/min (ref >60)
Glucose, Bld: 120 mg/dL — ABNORMAL HIGH (ref 70–99)
Potassium: 3.5 mmol/L (ref 3.5–5.1)
Sodium: 137 mmol/L (ref 135–145)

## 2021-06-21 LAB — GLUCOSE, CAPILLARY
Glucose-Capillary: 128 mg/dL — ABNORMAL HIGH (ref 70–99)
Glucose-Capillary: 121 mg/dL — ABNORMAL HIGH (ref 70–99)
Glucose-Capillary: 121 mg/dL — ABNORMAL HIGH (ref 70–99)

## 2021-06-21 LAB — PHOSPHORUS: Phosphorus: 2.8 mg/dL (ref 2.5–4.6)

## 2021-06-21 LAB — MAGNESIUM: Magnesium: 1.9 mg/dL (ref 1.7–2.4)

## 2021-06-21 MED ORDER — METOPROLOL SUCCINATE ER 25 MG PO TB24
25.0000 mg | ORAL_TABLET | Freq: Two times a day (BID) | ORAL | Status: DC
  Administered 2021-06-21 – 2021-06-22 (×2): 25 mg via ORAL
  Filled 2021-06-21 (×2): qty 1

## 2021-06-21 MED ORDER — POTASSIUM CHLORIDE CRYS ER 20 MEQ PO TBCR
20.0000 meq | EXTENDED_RELEASE_TABLET | Freq: Once | ORAL | Status: AC
  Administered 2021-06-21: 20 meq via ORAL
  Filled 2021-06-21: qty 2

## 2021-06-21 MED ORDER — MAGNESIUM SULFATE 2 GM/50ML IV SOLN
2.0000 g | Freq: Once | INTRAVENOUS | Status: AC
  Administered 2021-06-21: 2 g via INTRAVENOUS
  Filled 2021-06-21: qty 50

## 2021-06-21 NOTE — Progress Notes (Signed)
PROGRESS NOTE    Damon English  [id number] DOB: [birth year] DOA: 06/18/2021 PCP: [name], PA-C    Brief Narrative:  This is a 70 yo male presented to [facility] ER on 09/2 following a cardiac arrest.  Per ER notes pts wife reported while the pt was leaving his outpatient doctors office he felt like he was going to pass out.  He sat on his walker and lost consciousness.  Pts wife reported he briefly regained consciousness several times EMS notified.  When EMS arrived pt unresponsive and had a pulse, but required defibrillation x2.  En route to the ER he required bag mask ventilation.     ED course Upon arrival to the ER pt remained minimally responsive (opening eyes and moaning), however unable to follow commands.  Pt mechanically intubated per ED provider.  Pt hypertensive, however EKG x2 revealed complete heart block hr 50's and no concerning ST changes.  Cardiologist Dr. Fletcher Anon contacted by ER physician and given pts initial heart block rate remain in the 50's and pt remained hypertensive there was no indication for cardiac intervention, recommended continued medical management.  CT Head negative for acute abnormality and CTA Chest negative for PE, however concerning for early cirrhosis.  Lab results revealed K+ 2.2, chloride 91, glucose 240, creatinine 1.71, AST 99, ALT 77, troponin 52, wbc 12.0, and Respiratory panel by RT-PCR negative.  Pt also febrile temp 100/1~100.5 degrees F with right lower extremity warm to touch along with 2+ edema.  He received azactam, vancomycin, and doxycycline.    According to pts wife they went camping over the weekend and pt reported a right flank unknown insect bite.  Following camping trip on 08/31 pts wife stated he began having fevers and right lower extremity pain.  He saw his outpatient vascular physician on 09/2 for evaluation of symptoms and all test came back negative.     PCCM team contacted by ER physician for ICU admission and normothermia protocol  initiated.   PCCM pickup on 9/4  9/5- wife at bedside. Pt is oriented. Denies sob, cp, dizziness. Unna boot of RLE placed since going to work with PT today   Consultants:  pccm  Procedures:  CT Head 09/2>>No acute abnormality Chronic infarct left parietal lobe. Chronic microvascular ischemic change in the white matter. CTA Chest 09/2>>No evidence of acute pulmonary embolism or other acute chest process. Diffuse coronary and Aortic Atherosclerosis (ICD10-I70.0). Previous CABG. Mild emphysema with probable mild dependent atelectasis in both lungs. Potential changes of early cirrhosis.  Mildly displaced acute fracture of the right sixth rib clearly nondisplaced fractures of the right anterior fourth fifth and seventh ribs without pneumothorax or pleural effusion.  This is likely a result from CPR. CT Femur Right 09/2>>previous right proximal femoral ORIF CT Foot Right 09/2>> changes consistent with cellulitis CT Tibia Fibular Right 09/2>> extensive atherosclerosis post right femoral-popliteal bypass grafting   Antimicrobials:  Respiratory panel by RT-PCR 09/2>>negative  Blood x2 09/2>> Urine 09/2>> Lyme disease serology w/reflex 09/2>> [place] spotted fvr abs pnl (IgG+IgM) 09/2>>   Azactam 09/2 x1 dose Doxycycline 09/2>> Vancomycin 09/2>> 9/3 Cefepime 9/3>>       Subjective: Still with sore throat and feels little congested.With cough   Objective: Vitals:   06/21/21 0400 06/21/21 0503 06/21/21 0505 06/21/21 0727  BP: (!) 155/70  (!) 166/75 (!) 171/78  Pulse: 97 94 88 96  Resp: (!) '28 18  18  '$ Temp:  98.9 F (37.2 C)  98.1 F (36.7 C)  TempSrc:    Oral  SpO2: 95% 98%  100%  Weight:   104.2 kg   Height:        Intake/Output Summary (Last 24 hours) at 06/21/2021 0824 Last data filed at 06/21/2021 0200 Gross per 24 hour  Intake 1120 ml  Output 850 ml  Net 270 ml   Filed Weights   06/19/21 0444 06/20/21 0410 06/21/21 0505  Weight: 97.2 kg 98.8 kg 104.2 kg     Examination:  Nad, calm Decrease breath sounds, no wheezing regular Regular S1-S2 no gallops Soft benign positive bowel sounds Positive edema bilaterally, right lower extremity wrapped in Unna boot Aaxox3  grossly intact  mood and affect appropriate in current setting     Data Reviewed: I have personally reviewed following labs and imaging studies  CBC: Recent Labs  Lab 06/18/21 1513 06/18/21 2100 06/19/21 0630 06/20/21 0225 06/21/21 0431  WBC 12.0* 10.6* 10.9* 7.9 7.0  NEUTROABS 8.5*  --   --   --   --   HGB 14.5 14.6 12.3* 11.3* 10.4*  HCT 43.4 42.6 34.6* 33.8* 30.9*  MCV 91.9 93.2 92.0 96.6 95.7  PLT 201 173 156 136* 123456*   Basic Metabolic Panel: Recent Labs  Lab 06/18/21 1513 06/18/21 1731 06/18/21 2100 06/18/21 2358 06/19/21 0630 06/19/21 1657 06/20/21 0225 06/20/21 1625 06/21/21 0431  NA 135  --   --   --  139  --  136  --  137  K 2.2*  --   --    < > 2.8* 2.9* 3.0* 3.4* 3.5  CL 91*  --   --   --  101  --  103  --  108  CO2 29  --   --   --  28  --  26  --  22  GLUCOSE 240*  --   --   --  147*  --  108*  --  120*  BUN 20  --   --   --  21  --  17  --  15  CREATININE 1.71*  --  1.64*  --  1.48*  --  1.34*  --  1.15  CALCIUM 9.5  --   --   --  8.5*  --  8.0*  --  8.2*  MG  --  1.8  --   --  1.7 2.1 2.0  --  1.9  PHOS  --   --   --   --  3.3  --  2.4*  --  2.8   < > = values in this interval not displayed.   GFR: Estimated Creatinine Clearance: 68.3 mL/min (by C-G formula based on SCr of 1.15 mg/dL). Liver Function Tests: Recent Labs  Lab 06/18/21 1513 06/19/21 0630 06/20/21 0225 06/21/21 0431  AST 99* 41 30 19  ALT 77* 42 34 23  ALKPHOS 68 46 48 45  BILITOT 1.4* 0.8 1.0 1.0  PROT 8.4* 5.7* 6.2* 5.9*  ALBUMIN 4.1 2.4* 2.8* 2.6*   No results for input(s): LIPASE, AMYLASE in the last 168 hours. No results for input(s): AMMONIA in the last 168 hours. Coagulation Profile: Recent Labs  Lab 06/18/21 1731  INR 1.6*   Cardiac  Enzymes: No results for input(s): CKTOTAL, CKMB, CKMBINDEX, TROPONINI in the last 168 hours. BNP (last 3 results) No results for input(s): PROBNP in the last 8760 hours. HbA1C: Recent Labs    06/18/21 2100  HGBA1C 6.1*   CBG: Recent Labs  Lab 06/19/21 1951  06/20/21 0739 06/20/21 1200 06/20/21 1613 06/20/21 2243  GLUCAP 116* 100* 99 124* 104*   Lipid Profile: No results for input(s): CHOL, HDL, LDLCALC, TRIG, CHOLHDL, LDLDIRECT in the last 72 hours. Thyroid Function Tests: No results for input(s): TSH, T4TOTAL, FREET4, T3FREE, THYROIDAB in the last 72 hours. Anemia Panel: No results for input(s): VITAMINB12, FOLATE, FERRITIN, TIBC, IRON, RETICCTPCT in the last 72 hours. Sepsis Labs: Recent Labs  Lab 06/18/21 1731 06/18/21 2000 06/18/21 2100 06/19/21 0630 06/20/21 0225  PROCALCITON  --   --  0.31 0.38 0.25  LATICACIDVEN 2.6* 2.1*  --  1.3  --     Recent Results (from the past 240 hour(s))  Resp Panel by RT-PCR (Flu A&B, Covid) Nasopharyngeal Swab     Status: None   Collection Time: 06/18/21  3:12 PM   Specimen: Nasopharyngeal Swab; Nasopharyngeal(NP) swabs in vial transport medium  Result Value Ref Range Status   SARS Coronavirus 2 by RT PCR NEGATIVE NEGATIVE Final    Comment: (NOTE) SARS-CoV-2 target nucleic acids are NOT DETECTED.  The SARS-CoV-2 RNA is generally detectable in upper respiratory specimens during the acute phase of infection. The lowest concentration of SARS-CoV-2 viral copies this assay can detect is 138 copies/mL. A negative result does not preclude SARS-Cov-2 infection and should not be used as the sole basis for treatment or other patient management decisions. A negative result may occur with  improper specimen collection/handling, submission of specimen other than nasopharyngeal swab, presence of viral mutation(s) within the areas targeted by this assay, and inadequate number of viral copies(<138 copies/mL). A negative result must be  combined with clinical observations, patient history, and epidemiological information. The expected result is Negative.  Fact Sheet for Patients:  EntrepreneurPulse.com.au  Fact Sheet for Healthcare Providers:  IncredibleEmployment.be  This test is no t yet approved or cleared by the [place] FDA and  has been authorized for detection and/or diagnosis of SARS-CoV-2 by FDA under an Emergency Use Authorization (EUA). This EUA will remain  in effect (meaning this test can be used) for the duration of the COVID-19 declaration under Section 564(b)(1) of the Act, 21 U.S.C.section 360bbb-3(b)(1), unless the authorization is terminated  or revoked sooner.       Influenza A by PCR NEGATIVE NEGATIVE Final   Influenza B by PCR NEGATIVE NEGATIVE Final    Comment: (NOTE) The Xpert Xpress SARS-CoV-2/FLU/RSV plus assay is intended as an aid in the diagnosis of influenza from Nasopharyngeal swab specimens and should not be used as a sole basis for treatment. Nasal washings and aspirates are unacceptable for Xpert Xpress SARS-CoV-2/FLU/RSV testing.  Fact Sheet for Patients: EntrepreneurPulse.com.au  Fact Sheet for Healthcare Providers: IncredibleEmployment.be  This test is not yet approved or cleared by the [place] FDA and has been authorized for detection and/or diagnosis of SARS-CoV-2 by FDA under an Emergency Use Authorization (EUA). This EUA will remain in effect (meaning this test can be used) for the duration of the COVID-19 declaration under Section 564(b)(1) of the Act, 21 U.S.C. section 360bbb-3(b)(1), unless the authorization is terminated or revoked.  Performed at [facility], [address] [place], [place] [address]   Blood Culture (routine x 2)     Status: None (Preliminary result)   Collection Time: 06/18/21  5:31 PM   Specimen: BLOOD  Result Value Ref Range Status   Specimen  Description BLOOD RIGHT HAND  Final   Special Requests   Final    BOTTLES DRAWN AEROBIC AND ANAEROBIC Blood Culture  adequate volume   Culture   Final    NO GROWTH < 12 HOURS Performed at [facility], [address] [place], [place] [address]    Report Status PENDING  Incomplete  Blood Culture (routine x 2)     Status: None (Preliminary result)   Collection Time: 06/18/21  5:31 PM   Specimen: BLOOD  Result Value Ref Range Status   Specimen Description BLOOD RIGHT [facility]  Final   Special Requests   Final    BOTTLES DRAWN AEROBIC AND ANAEROBIC Blood Culture adequate volume   Culture   Final    NO GROWTH < 12 HOURS Performed at [facility], [address], [place], [place] [address]    Report Status PENDING  Incomplete  Urine Culture     Status: None   Collection Time: 06/18/21  5:31 PM   Specimen: In/Out Cath Urine  Result Value Ref Range Status   Specimen Description   Final    IN/OUT CATH URINE Performed at [facility], [address], [place], [place] [address]    Special Requests   Final    NONE Performed at [facility], [address] [place], [place] [address]    Culture   Final    NO GROWTH Performed at [facility], [place] [address], [place], [place] [address]    Report Status 06/20/2021 FINAL  Final         Radiology Studies: ECHOCARDIOGRAM COMPLETE  Result Date: 06/19/2021    ECHOCARDIOGRAM REPORT   Patient Name:   Damon English Date of Exam: 06/19/2021 Medical Rec #:  [id number]        Height:       65.0 in Accession #:    [id number]       Weight:       214.3 lb Date of Birth:  01-31-1951         BSA:          2.037 m Patient Age:    52 years         BP:           124/45 mmHg Patient Gender: M                HR:           65 bpm. Exam Location:  [facility] Procedure: 2D Echo and Intracardiac Opacification Agent Indications:     Cardiac arrest I46.9  History:         Patient has prior history of Echocardiogram examinations,  most                  recent 01/28/2019.  Sonographer:     Kathlen Brunswick RDCS Referring Phys:  [id number] [place] E GRAVES Diagnosing Phys: Kate Sable MD  Sonographer Comments: Technically challenging study due to limited acoustic windows and echo performed with patient supine and on artificial respirator. IMPRESSIONS  1. Left ventricular ejection fraction, by estimation, is 50%. The left ventricle has low normal function. The left ventricle has no regional wall motion abnormalities. Left ventricular diastolic function could not be evaluated.  2. Right ventricular systolic function is low normal. The right ventricular size is not well visualized.  3. The mitral valve is normal in structure. No evidence of mitral valve regurgitation.  4. The aortic valve is grossly normal. Aortic valve regurgitation is not visualized. FINDINGS  Left Ventricle: Left ventricular ejection fraction, by estimation, is 50%. The left ventricle has low normal  function. The left ventricle has no regional wall motion abnormalities. Definity contrast agent was given IV to delineate the left ventricular endocardial borders. The left ventricular internal cavity size was normal in size. There is no left ventricular hypertrophy. Left ventricular diastolic function could not be evaluated. Right Ventricle: The right ventricular size is not well visualized. No increase in right ventricular wall thickness. Right ventricular systolic function is low normal. Left Atrium: Left atrial size was normal in size. Right Atrium: Right atrial size was not well visualized. Pericardium: There is no evidence of pericardial effusion. Mitral Valve: The mitral valve is normal in structure. No evidence of mitral valve regurgitation. Tricuspid Valve: The tricuspid valve is not well visualized. Tricuspid valve regurgitation is not demonstrated. Aortic Valve: The aortic valve is grossly normal. Aortic valve regurgitation is not visualized. Pulmonic Valve: The pulmonic  valve was not well visualized. Pulmonic valve regurgitation is not visualized. Aorta: The aortic root is normal in size and structure. Venous: IVC assessment for right atrial pressure unable to be performed due to mechanical ventilation. IAS/Shunts: The interatrial septum was not well visualized.  LEFT VENTRICLE PLAX 2D LVIDd:         5.40 cm LVIDs:         4.20 cm LV PW:         1.10 cm LV IVS:        1.00 cm LVOT diam:     2.20 cm LVOT Area:     3.80 cm  LEFT ATRIUM         Index LA diam:    3.70 cm 1.82 cm/m   AORTA Ao Root diam: 3.40 cm Ao Asc diam:  3.40 cm  SHUNTS Systemic Diam: 2.20 cm Kate Sable MD Electronically signed by Kate Sable MD Signature Date/Time: 06/19/2021/3:43:59 PM    Final         Scheduled Meds:  acidophilus  2 capsule Oral TID   apixaban  5 mg Oral BID   Chlorhexidine Gluconate Cloth  6 each Topical Daily   clopidogrel  75 mg Oral Daily   docusate sodium  100 mg Oral BID   doxycycline  100 mg Oral [id number]   folic acid  1 mg Oral Daily   guaiFENesin  600 mg Oral BID   insulin aspart  0-15 Units Subcutaneous TID AC & HS   levalbuterol  0.63 mg Nebulization Once   multivitamin with minerals  1 tablet Oral Daily   pantoprazole (PROTONIX) IV  40 mg Intravenous QHS   potassium chloride  20 mEq Oral Once   [START ON 06/23/2021] thiamine injection  100 mg Intravenous Daily   Continuous Infusions:  sodium chloride     ceFEPime (MAXIPIME) IV 2 g (06/21/21 0520)   magnesium sulfate bolus IVPB     norepinephrine (LEVOPHED) Adult infusion Stopped (06/19/21 0948)   thiamine injection Stopped (06/20/21 2314)    Assessment & Plan:   Active Problems:   NSTEMI (non-ST elevated myocardial infarction) (HCC)   Chronic systolic CHF (congestive heart failure) (HCC)   Ischemic cardiomyopathy   Type 2 diabetes mellitus (HCC)   Cardiac arrest (HCC)   ETOH abuse   Acute respiratory failure with hypoxia and hypercapnia (HCC)   On mechanically assisted ventilation (HCC)    Cellulitis of leg, right   Sepsis ([place])   Acute kidney injury (AKI) with acute tubular necrosis (ATN) (HCC)   Acute encephalopathy   Hypokalemia   Transaminitis   Pressure injury of skin   Acute  hypoxic respiratory failure post cardiac arrest  Mechanical intubation  Hx: Tobacco Abuse  Status post extubation on 9/3 Complaining of cough, will obtain chest x-ray Procalcitonin is decreasing Keep O2 sats above 92%     Cardiac arrest requiring defibrillation suspected secondary to electrolyte imbalance Elevated troponin likely demand ischemia: 52~ Complete heart block~resolved  Intermittent HTN  Hx: 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, Chronic Systolic CHF EF [id number], Orthostatic Hypotension, 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, VT Arrest (discharged with a life vest), Mixed HLD, and PAD s/p Right Leg Bypass  Status post extubation 9/3 Troponins trending down Echo with LVEF of 50% no regional wall motion abnormality Suspect cardiac arrest due to profound hypokalemia Cardiology was contacted by ER physician who recommended no indication for intervention for complete heart block and continue medical management Replace electrolytes 9/5 cardiology was consulted since patient has history of CAD and PVD to rule out ischemic causes of his cardiac arrest Cardiology's input was appreciated plan for Lexiscan Myoview in a.m. Restratification Replace electrolytes      Acute kidney injury secondary to ATN  Hypokalemia  K stable. Was given 20 mEq KCl this a.m. Mag 1.9 AKI improved at baseline now     Sepsis suspected secondary to tick-borne illness vs. right lower extremity cellulitis  Leg appears to be improved.  Right lower extremity Unna boot placed WBC trending down, procalcitonin trending down Blood and urine culture pending Labs for tick bite sent.  Continue antibiotics with cefepime for cellulitis Doxycycline for recent exposure to possible tick bite     Transaminitis~CT Chest 09/2 concerning  for early cirrhosis  Improved [place] abd with moderate hepatic steatosis   Type II diabetes mellitus  - Hemoglobin A1c 8.1 BG stable - SSI    Acute encephalopathy resolving ~CT Head 09/2 negative for acute abnormality  - EEG not needed now as the patient appears to be clearing clouded sensorium - Discontinue normothermic protocol patient complaining of being cold 9/5- resolved.   History of alcohol use -Monitor for potential DTs -Correct electrolytes -Supplement thiamine and folate   Rib fractures due to CPR -Currently asymptomatic -Pain control as needed     DVT prophylaxis: scd Code Status:full Family Communication: wife updated. On 9/4)extensive conversation with wife about pts medical status. Concerned that he appears to not remember as she had to repeat answers as pt kept asking same questions. D/w wife likely due to cardiac arrest. May take some time for him to get back to baseline or close to it. She asked about eeg, explained no need at this time. Will monitor if necessary next 1-2 days will consult neurology   Disposition Plan:  Status is: Inpatient  Remains inpatient appropriate because:Inpatient level of care appropriate due to severity of illness  Dispo: The patient is from: Home              Anticipated d/c is to: Home              Patient currently is not medically stable to d/c.   Difficult to place patient No            LOS: 3 days   Time spent: 35 minutes with more than 50% on [place], MD [facility] Pager [phone]  If 7PM-7AM, please contact night-coverage 06/21/2021, 8:24 AM

## 2021-06-21 NOTE — Evaluation (Signed)
Physical Therapy Evaluation Patient Details Name: Damon English MRN: [id number] DOB: October 04, 1951 Today's Date: 06/21/2021   History of Present Illness  Pt is a 70 y/o M admitted on 06/18/21  after presenting to the ER following a cardiac arrest. Pt mechanically intubated per ED provider.  Pt hypertensive, however EKG x2 revealed complete heart block hr 50's and no concerning ST changes. 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 Pt noted to have severe hypokalemia & RLE cellulitis. 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 PT reviewed use of incentive spirometer & educated pt on use of acapella flutter valve with pt return demonstrating during session. Pt requires supervision for bed mobility with hospital bed features, CGA for sit<>stand, & min assist for short distance gait in room with RW with pt demonstrating decreased step length BLE & decreased gait speed with pt endorsing chronic L knee pain. Pt does note chest soreness throughout session, especially with use of flutter valve. Educated pt & wife on recommendation of f/u HHPT with them agreeable. Will continue to follow pt acutely to progress gait with LRAD & practice stair negotiation.      Follow Up Recommendations Home health PT;Supervision for mobility/OOB    Equipment Recommendations  None recommended by PT (pt & wife report they have a rollator & RW)    Recommendations for Other Services       Precautions / Restrictions Precautions Precautions: Fall Restrictions Weight Bearing Restrictions: No      Mobility  Bed Mobility Overal bed mobility: Needs Assistance Bed Mobility: Supine to Sit     Supine to sit: Supervision;HOB elevated     General bed mobility comments: extra time 2/2 soreness, use of bed rails     Transfers Overall transfer level: Needs assistance Equipment used: Rolling walker (2 wheeled) Transfers: Sit to/from Stand Sit to Stand: Min guard            Ambulation/Gait Ambulation/Gait assistance: Herbalist (Feet): 25 Feet Assistive device: Rolling walker (2 wheeled) Gait Pattern/deviations: Decreased step length - left;Decreased step length - right;Decreased stride length Gait velocity: decreased   General Gait Details: Pt ambulates around bed to door & back then notes fatigue.  Stairs            Wheelchair Mobility    Modified Rankin (Stroke Patients Only)       Balance Overall balance assessment: Needs assistance Sitting-balance support: Feet supported;Bilateral upper extremity supported Sitting balance-Leahy Scale: Good Sitting balance - Comments: Able to don R shoe with supervision for sitting balance, wife assists with L shoe   Standing balance support: During functional activity;Bilateral upper extremity supported Standing balance-Leahy Scale: Fair Standing balance comment: BUE support on RW                             Pertinent Vitals/Pain Pain Assessment: Faces Faces Pain Scale: Hurts little more Pain Location: chest soreness with use of acapella flutter valve, L knee chronic pain Pain Descriptors / Indicators: Aching;Sore Pain Intervention(s): Monitored during session (rest breaks PRN)    Home Living Family/patient expects to be discharged to:: Private residence Living Arrangements: Spouse/significant other Available Help at Discharge: Family;Available 24 hours/day Type of Home: House Home Access: Stairs to enter   [employer] of  Steps: 2 at front door with L rail, 3 at back door with R rail Home Layout: Two level;Able to live on main level with bedroom/bathroom Home Equipment: Gilford Rile - 2 wheels;Walker - 4 wheels      Prior Function Level of Independence: Independent with assistive device(s)          Comments: Intermittent use of rollator, works "for himself" from home.     Hand Dominance        Extremity/Trunk Assessment   Upper Extremity Assessment Upper Extremity Assessment: Generalized weakness    Lower Extremity Assessment Lower Extremity Assessment: Generalized weakness       Communication   Communication: No difficulties  Cognition Arousal/Alertness: Awake/alert Behavior During Therapy: WFL for tasks assessed/performed Overall Cognitive Status: Within Functional Limits for tasks assessed                                        General Comments General comments (skin integrity, edema, etc.): HR up to 125 bpm with mobility but does drop as low as 97 bpm when returning to recliner    Exercises     Assessment/Plan    PT Assessment Patient needs continued PT services  PT Problem List Decreased strength;Decreased mobility;Decreased activity tolerance;Decreased balance;Pain;Cardiopulmonary status limiting activity;Obesity       PT Treatment Interventions DME instruction;Therapeutic exercise;Gait training;Balance training;Manual techniques;Stair training;Neuromuscular re-education;Functional mobility training;Cognitive remediation;Therapeutic activities;Patient/family education;Modalities    PT Goals (Current goals can be found in the Care Plan section)  Acute Rehab PT Goals Patient Stated Goal: get better PT Goal Formulation: With patient/family Time For Goal Achievement: 07/05/21 Potential to Achieve Goals: Good    Frequency Min 2X/week   Barriers to discharge        Co-evaluation               AM-PAC PT "6 Clicks" Mobility  Outcome Measure Help needed turning from your back to your side while in a flat bed without using bedrails?: A Little Help needed moving from lying on your back to sitting on the side of a flat bed without using bedrails?: A Little Help needed moving to and from a bed to a chair (including a  wheelchair)?: A Little Help needed standing up from a chair using your arms (e.g., wheelchair or bedside chair)?: A Little Help needed to walk in hospital room?: A Little Help needed climbing 3-5 steps with a railing? : A Lot 6 Click Score: 17    End of Session   Activity Tolerance: Patient tolerated treatment well Patient left: with chair alarm set;in chair;with family/visitor present Nurse Communication: Mobility status (HR) PT Visit Diagnosis: Unsteadiness on feet (R26.81);Muscle weakness (generalized) (M62.81)    Time: [id number] PT Time Calculation (min) (ACUTE ONLY): 24 min   Charges:   PT Evaluation $PT Eval Moderate Complexity: 1 Mod PT Treatments $Therapeutic Activity: 8-22 mins        Lavone Nian, PT, DPT 06/21/21, 1:54 PM   Waunita Schooner 06/21/2021, 1:50 PM

## 2021-06-21 NOTE — Progress Notes (Signed)
[facility] Encounter Note  Patient: Damon English / Admit Date: 06/18/2021 / Date of Encounter: 06/21/2021, 7:15 AM   Subjective: Patient is done very well overnight with no evidence of significant chest pain shortness of breath or other significant symptoms.  Prior to this event the patient had no evidence of worsening angina, in fact was improving with his medical management and anginal symptoms.  Cardiac arrest was in the setting potassium of 2.2 and possible sepsis.  Troponin peak was 1533 possibly consistent with CPR.  Echocardiogram showing low normal LV systolic function ejection fraction of 50%.  Patient has tolerated appropriate medication management with no evidence of heart failure or angina since admission  Review of Systems: Positive for: None Negative for: Vision change, hearing change, syncope, dizziness, nausea, vomiting,diarrhea, bloody stool, stomach pain, cough, congestion, diaphoresis, urinary frequency, urinary pain,skin lesions, skin rashes Others previously listed  Objective: Telemetry: Normal sinus rhythm Physical Exam: Blood pressure (!) 166/75, pulse 88, temperature 98.9 F (37.2 C), resp. rate 18, height '5\' 5"'$  (1.651 m), weight 104.2 kg, SpO2 98 %. Body mass index is 38.24 kg/m. General: Well developed, well nourished, in no acute distress. Head: Normocephalic, atraumatic, sclera non-icteric, no xanthomas, nares are without discharge. Neck: No apparent masses Lungs: Normal respirations with no wheezes, no rhonchi, no rales , no crackles   Heart: Regular rate and rhythm, normal S1 S2, no murmur, no rub, no gallop, PMI is normal size and placement, carotid upstroke normal without bruit, jugular venous pressure normal Abdomen: Soft, non-tender, non-distended with normoactive bowel sounds. No hepatosplenomegaly. Abdominal aorta is normal size without bruit Extremities: No edema, no clubbing, no cyanosis, no ulcers,  Peripheral: 2+ radial, 2+  femoral, 2+ dorsal pedal pulses Neuro: Alert and oriented. Moves all extremities spontaneously. Psych:  Responds to questions appropriately with a normal affect.   Intake/Output Summary (Last 24 hours) at 06/21/2021 0715 Last data filed at 06/21/2021 0200 Gross per 24 hour  Intake 1120 ml  Output 850 ml  Net 270 ml    Inpatient Medications:   acidophilus  2 capsule Oral TID   apixaban  5 mg Oral BID   Chlorhexidine Gluconate Cloth  6 each Topical Daily   clopidogrel  75 mg Oral Daily   docusate sodium  100 mg Oral BID   doxycycline  100 mg Oral [id number]   folic acid  1 mg Oral Daily   guaiFENesin  600 mg Oral BID   insulin aspart  0-15 Units Subcutaneous TID AC & HS   levalbuterol  0.63 mg Nebulization Once   multivitamin with minerals  1 tablet Oral Daily   pantoprazole (PROTONIX) IV  40 mg Intravenous QHS   [START ON 06/23/2021] thiamine injection  100 mg Intravenous Daily   Infusions:   sodium chloride     ceFEPime (MAXIPIME) IV 2 g (06/21/21 0520)   norepinephrine (LEVOPHED) Adult infusion Stopped (06/19/21 0948)   thiamine injection Stopped (06/20/21 2314)    Labs: Recent Labs    06/20/21 0225 06/20/21 1625 06/21/21 0431  NA 136  --  137  K 3.0* 3.4* 3.5  CL 103  --  108  CO2 26  --  22  GLUCOSE 108*  --  120*  BUN 17  --  15  CREATININE 1.34*  --  1.15  CALCIUM 8.0*  --  8.2*  MG 2.0  --  1.9  PHOS 2.4*  --  2.8   Recent Labs    06/20/21 0225 06/21/21  0431  AST 30 19  ALT 34 23  ALKPHOS 48 45  BILITOT 1.0 1.0  PROT 6.2* 5.9*  ALBUMIN 2.8* 2.6*   Recent Labs    06/18/21 1513 06/18/21 2100 06/20/21 0225 06/21/21 0431  WBC 12.0*   < > 7.9 7.0  NEUTROABS 8.5*  --   --   --   HGB 14.5   < > 11.3* 10.4*  HCT 43.4   < > 33.8* 30.9*  MCV 91.9   < > 96.6 95.7  PLT 201   < > 136* 149*   < > = values in this interval not displayed.   No results for input(s): CKTOTAL, CKMB, TROPONINI in the last 72 hours. Invalid input(s): POCBNP Recent Labs     06/18/21 2100  HGBA1C 6.1*     Weights: Filed Weights   06/19/21 0444 06/20/21 0410 06/21/21 0505  Weight: 97.2 kg 98.8 kg 104.2 kg     Radiology/Studies:  DG Abdomen 1 View  Result Date: 06/18/2021 CLINICAL DATA:  Orogastric tube placement EXAM: ABDOMEN - 1 VIEW COMPARISON:  6:26 p.m. FINDINGS: Orogastric tube tip now overlies the gastric fundus. Visualized lung bases are clear. No free intraperitoneal gas. The abdominal gas pattern is indeterminate due to a paucity of intra-abdominal gas. Pelvis excluded from view. IMPRESSION: Orogastric tube tip within the gastric fundus. Electronically Signed   By: Fidela Salisbury M.D.   On: 06/18/2021 21:15   DG Abd 1 View  Result Date: 06/18/2021 CLINICAL DATA:  Encounter for NG tube placement EXAM: ABDOMEN - 1 VIEW COMPARISON:  None. FINDINGS: Defibrillator pads overlie the upper abdomen. There is no evidence of bowel obstruction. There is no visible NG tube. The NG tube appears to be overlying the midesophagus on separately dictated chest radiograph. Prior L3-L5 fusion. No acute osseous abnormality. Bilateral iliac stents IMPRESSION: No visible nasogastric tube overlying the abdomen. See separately dictated chest radiograph. Electronically Signed   By: Maurine Simmering M.D.   On: 06/18/2021 18:53   CT HEAD WO CONTRAST (5MM)  Result Date: 06/18/2021 CLINICAL DATA:  Mental status change.  Patient intubated EXAM: CT HEAD WITHOUT CONTRAST TECHNIQUE: Contiguous axial images were obtained from the base of the skull through the vertex without intravenous contrast. COMPARISON:  None. FINDINGS: Brain: Hypodensity left parietal lobe involving cortex and white matter compatible with chronic infarct. Patchy white matter hypodensity bilaterally likely chronic. Negative for hydrocephalus. Negative for acute infarct, hemorrhage or mass. 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 Electronically Signed   By: Franchot Gallo M.D.   On: 06/18/2021 16:20   CT Angio Chest PE W and/or Wo Contrast  Addendum Date: 06/19/2021   ADDENDUM REPORT: 06/19/2021 10:11 ADDENDUM: Upon further review, there is a mildly displaced acute fracture of the right 6th rib anteriorly (image 64/6). In addition, there are possible nondisplaced fractures the right anterior 4th, 5th and 7th ribs. There is also a probable fracture of the left 6th rib anteriorly. No pneumothorax or pleural effusion. These results were discussed by telephone at the time of interpretation on 06/19/2021 at 09:55 am with provider Derrill Kay, who verbally acknowledged these results. Electronically Signed   By: Richardean Sale M.D.   On: 06/19/2021 10:11   Result Date: 06/19/2021 CLINICAL DATA:  PE suspected, high probability.  Intubated patient. EXAM: CT ANGIOGRAPHY CHEST WITH CONTRAST TECHNIQUE: Multidetector CT imaging of the  chest was performed using the standard protocol during bolus administration of intravenous contrast. Multiplanar CT image reconstructions and MIPs were obtained to evaluate the vascular anatomy. CONTRAST:  76m OMNIPAQUE IOHEXOL 350 MG/ML SOLN COMPARISON:  Radiographs 01/28/2019. FINDINGS: Cardiovascular: The pulmonary arteries are well opacified with contrast to the level of the subsegmental branches. There is no evidence of acute pulmonary embolism. There is essentially no contrast opacification of the systemic arterial system. Patient is status post median sternotomy and CABG. There is diffuse atherosclerosis of the aorta, great vessels and coronary arteries. There is some reflux of contrast into the IVC and hepatic veins. The heart size is normal. There is no pericardial effusion. Mediastinum/Nodes: Tip of the endotracheal tube is in the mid trachea. There are no enlarged mediastinal, hilar or axillary lymph nodes. The thyroid  gland, trachea and esophagus demonstrate no significant findings. Lungs/Pleura: There is no pleural effusion. Mild centrilobular emphysema with dependent pulmonary opacities bilaterally, favoring atelectasis. No consolidation or suspicious pulmonary nodularity. Triangular-shaped 4 mm right middle lobe nodule on image 47/6, likely benign. Upper abdomen: As above, there is some reflux of contrast into the IVC and hepatic veins. The liver contours are mildly irregular. The gallbladder is surgically absent. Musculoskeletal/Chest wall: There is no chest wall mass or suspicious osseous finding. Previous median sternotomy. Multilevel thoracic spondylosis. Review of the MIP images confirms the above findings. IMPRESSION: 1. No evidence of acute pulmonary embolism or other acute chest process. 2. Diffuse coronary and Aortic Atherosclerosis (ICD10-I70.0). Previous CABG. 3. Mild emphysema with probable mild dependent atelectasis in both lungs. 4. Potential changes of early cirrhosis. Electronically Signed: By: WRichardean SaleM.D. On: 06/18/2021 16:20   CT FEMUR RIGHT WO CONTRAST  Result Date: 06/18/2021 CLINICAL DATA:  Recent insect bite with fever and right lower extremity pain and edema. EXAM: CT OF THE RIGHT FEMUR, LOWER LEG AND FOOT WITHOUT CONTRAST TECHNIQUE: Multidetector CT imaging of the right lower extremity was performed according to the standard protocol. The images extend from the lower right pelvis through the right foot. Multiplanar CT image reconstructions were also generated. COMPARISON:  None. FINDINGS: Bones/Joint/Cartilage No evidence of acute fracture or dislocation. Status post proximal right femoral dynamic plate and screw fixation of an intertrochanteric femur fracture. The hardware is intact without loosening. There is irregular periosteal ossification anteriorly, likely chronic and related to the original injury. The distal femur, tibia, fibula and foot demonstrate no significant findings. No large  joint effusions are seen. Ligaments Suboptimally assessed by CT. Muscles and Tendons No focal muscular atrophy or fluid collection identified. The extensor mechanism is intact at the knee. The ankle tendons appear intact and normally located. Soft tissues Bilateral external iliac stents are noted with postsurgical changes from right femoropopliteal bypass grafting. There is extensive atherosclerosis of the underlying native arteries in the right lower extremity. Foley catheter is in place with mild bladder wall thickening. There is contrast in the bladder related to the preceding chest CTA. Postsurgical changes are present in the subcutaneous fat lateral to the proximal right femur, attributed to the previous ORIF. No inflammatory changes or fluid collections are seen within the thigh. There is diffuse subcutaneous edema within the lower leg, greatest distally. No focal fluid collection, soft tissue emphysema or foreign body identified. This edema extends into the foot. IMPRESSION: 1. Diffuse subcutaneous edema within the lower leg and foot consistent with soft tissue infection (cellulitis). No focal fluid collection or unexpected foreign body identified. 2. No evidence of deep soft tissue infection, septic  arthritis or osteomyelitis. 3. Previous right proximal femoral ORIF without acute osseous findings. 4. Extensive atherosclerosis post right femoropopliteal bypass grafting. Electronically Signed   By: Richardean Sale M.D.   On: 06/18/2021 20:52   CT TIBIA FIBULA RIGHT WO CONTRAST  Result Date: 06/18/2021 CLINICAL DATA:  Recent insect bite with fever and right lower extremity pain and edema. EXAM: CT OF THE RIGHT FEMUR, LOWER LEG AND FOOT WITHOUT CONTRAST TECHNIQUE: Multidetector CT imaging of the right lower extremity was performed according to the standard protocol. The images extend from the lower right pelvis through the right foot. Multiplanar CT image reconstructions were also generated. COMPARISON:   None. FINDINGS: Bones/Joint/Cartilage No evidence of acute fracture or dislocation. Status post proximal right femoral dynamic plate and screw fixation of an intertrochanteric femur fracture. The hardware is intact without loosening. There is irregular periosteal ossification anteriorly, likely chronic and related to the original injury. The distal femur, tibia, fibula and foot demonstrate no significant findings. No large joint effusions are seen. Ligaments Suboptimally assessed by CT. Muscles and Tendons No focal muscular atrophy or fluid collection identified. The extensor mechanism is intact at the knee. The ankle tendons appear intact and normally located. Soft tissues Bilateral external iliac stents are noted with postsurgical changes from right femoropopliteal bypass grafting. There is extensive atherosclerosis of the underlying native arteries in the right lower extremity. Foley catheter is in place with mild bladder wall thickening. There is contrast in the bladder related to the preceding chest CTA. Postsurgical changes are present in the subcutaneous fat lateral to the proximal right femur, attributed to the previous ORIF. No inflammatory changes or fluid collections are seen within the thigh. There is diffuse subcutaneous edema within the lower leg, greatest distally. No focal fluid collection, soft tissue emphysema or foreign body identified. This edema extends into the foot. IMPRESSION: 1. Diffuse subcutaneous edema within the lower leg and foot consistent with soft tissue infection (cellulitis). No focal fluid collection or unexpected foreign body identified. 2. No evidence of deep soft tissue infection, septic arthritis or osteomyelitis. 3. Previous right proximal femoral ORIF without acute osseous findings. 4. Extensive atherosclerosis post right femoropopliteal bypass grafting. Electronically Signed   By: Richardean Sale M.D.   On: 06/18/2021 20:52   CT FOOT RIGHT WO CONTRAST  Result Date:  06/18/2021 CLINICAL DATA:  Recent insect bite with fever and right lower extremity pain and edema. EXAM: CT OF THE RIGHT FEMUR, LOWER LEG AND FOOT WITHOUT CONTRAST TECHNIQUE: Multidetector CT imaging of the right lower extremity was performed according to the standard protocol. The images extend from the lower right pelvis through the right foot. Multiplanar CT image reconstructions were also generated. COMPARISON:  None. FINDINGS: Bones/Joint/Cartilage No evidence of acute fracture or dislocation. Status post proximal right femoral dynamic plate and screw fixation of an intertrochanteric femur fracture. The hardware is intact without loosening. There is irregular periosteal ossification anteriorly, likely chronic and related to the original injury. The distal femur, tibia, fibula and foot demonstrate no significant findings. No large joint effusions are seen. Ligaments Suboptimally assessed by CT. Muscles and Tendons No focal muscular atrophy or fluid collection identified. The extensor mechanism is intact at the knee. The ankle tendons appear intact and normally located. Soft tissues Bilateral external iliac stents are noted with postsurgical changes from right femoropopliteal bypass grafting. There is extensive atherosclerosis of the underlying native arteries in the right lower extremity. Foley catheter is in place with mild bladder wall thickening. There is contrast in  the bladder related to the preceding chest CTA. Postsurgical changes are present in the subcutaneous fat lateral to the proximal right femur, attributed to the previous ORIF. No inflammatory changes or fluid collections are seen within the thigh. There is diffuse subcutaneous edema within the lower leg, greatest distally. No focal fluid collection, soft tissue emphysema or foreign body identified. This edema extends into the foot. IMPRESSION: 1. Diffuse subcutaneous edema within the lower leg and foot consistent with soft tissue infection  (cellulitis). No focal fluid collection or unexpected foreign body identified. 2. No evidence of deep soft tissue infection, septic arthritis or osteomyelitis. 3. Previous right proximal femoral ORIF without acute osseous findings. 4. Extensive atherosclerosis post right femoropopliteal bypass grafting. Electronically Signed   By: Richardean Sale M.D.   On: 06/18/2021 20:52   DG Chest Port 1 View  Result Date: 06/18/2021 CLINICAL DATA:  Encounter for ET tube and OG tube placement EXAM: PORTABLE CHEST 1 VIEW COMPARISON:  Same day CT chest FINDINGS: Endotracheal tube overlies the midthoracic trachea. There is an orogastric tube with tip overlying the mid esophagus. Fibular pads overlie the left lower chest. Enlarged cardiac silhouette. Bibasilar subsegmental atelectasis. There is no focal airspace consolidation. There is no large pleural effusion or visible pneumothorax. There is no acute osseous abnormality. IMPRESSION: Endotracheal tube tip overlies the midthoracic trachea. Orogastric tube tip overlies the mid esophagus, recommend advancement. Electronically Signed   By: Maurine Simmering M.D.   On: 06/18/2021 18:56   ECHOCARDIOGRAM COMPLETE  Result Date: 06/19/2021    ECHOCARDIOGRAM REPORT   Patient Name:   Damon English Date of Exam: 06/19/2021 Medical Rec #:  [id number]        Height:       65.0 in Accession #:    [id number]       Weight:       214.3 lb Date of Birth:  15-Mar-1951         BSA:          2.037 m Patient Age:    70 years         BP:           124/45 mmHg Patient Gender: M                HR:           65 bpm. Exam Location:  [facility] Procedure: 2D Echo and Intracardiac Opacification Agent Indications:     Cardiac arrest I46.9  History:         Patient has prior history of Echocardiogram examinations, most                  recent 01/28/2019.  Sonographer:     Kathlen Brunswick RDCS Referring Phys:  [id number] [facility] GRAVES Diagnosing Phys: Kate Sable MD  Sonographer Comments: Technically challenging study  due to limited acoustic windows and echo performed with patient supine and on artificial respirator. IMPRESSIONS  1. Left ventricular ejection fraction, by estimation, is 50%. The left ventricle has low normal function. The left ventricle has no regional wall motion abnormalities. Left ventricular diastolic function could not be evaluated.  2. Right ventricular systolic function is low normal. The right ventricular size is not well visualized.  3. The mitral valve is normal in structure. No evidence of mitral valve regurgitation.  4. The aortic valve is grossly normal. Aortic valve regurgitation is not visualized. FINDINGS  Left Ventricle: Left ventricular ejection fraction, by estimation, is 50%. The left ventricle  has low normal function. The left ventricle has no regional wall motion abnormalities. Definity contrast agent was given IV to delineate the left ventricular endocardial borders. The left ventricular internal cavity size was normal in size. There is no left ventricular hypertrophy. Left ventricular diastolic function could not be evaluated. Right Ventricle: The right ventricular size is not well visualized. No increase in right ventricular wall thickness. Right ventricular systolic function is low normal. Left Atrium: Left atrial size was normal in size. Right Atrium: Right atrial size was not well visualized. Pericardium: There is no evidence of pericardial effusion. Mitral Valve: The mitral valve is normal in structure. No evidence of mitral valve regurgitation. Tricuspid Valve: The tricuspid valve is not well visualized. Tricuspid valve regurgitation is not demonstrated. Aortic Valve: The aortic valve is grossly normal. Aortic valve regurgitation is not visualized. Pulmonic Valve: The pulmonic valve was not well visualized. Pulmonic valve regurgitation is not visualized. Aorta: The aortic root is normal in size and structure. Venous: IVC assessment for right atrial pressure unable to be performed due  to mechanical ventilation. IAS/Shunts: The interatrial septum was not well visualized.  LEFT VENTRICLE PLAX 2D LVIDd:         5.40 cm LVIDs:         4.20 cm LV PW:         1.10 cm LV IVS:        1.00 cm LVOT diam:     2.20 cm LVOT Area:     3.80 cm  LEFT ATRIUM         Index LA diam:    3.70 cm 1.82 cm/m   AORTA Ao Root diam: 3.40 cm Ao Asc diam:  3.40 cm  SHUNTS Systemic Diam: 2.20 cm Kate Sable MD Electronically signed by Kate Sable MD Signature Date/Time: 06/19/2021/3:43:59 PM    Final    US Abdomen Limited RUQ (LIVER/GB)  Result Date: 06/18/2021 CLINICAL DATA:  Elevated liver enzymes EXAM: ULTRASOUND ABDOMEN LIMITED RIGHT UPPER QUADRANT COMPARISON:  None. FINDINGS: Gallbladder: Absent Common bile duct: Diameter: 5 mm in proximal diameter Liver: The hepatic parenchymal echogenicity is diffusely increased, the echotexture is mildly coarsened, and there is poor acoustic through transmission most in keeping with changes of moderate hepatic steatosis. No focal intrahepatic masses are identified. There is no intrahepatic biliary ductal dilation. Portal vein is patent on color Doppler imaging with normal direction of blood flow towards the liver. Other: No ascites IMPRESSION: Status post cholecystectomy. Moderate hepatic steatosis. Electronically Signed   By: Fidela Salisbury M.D.   On: 06/18/2021 20:03     Assessment and Recommendation  70 y.o. male with known previous myocardial infarction having right leg infection dehydration hypokalemia and apparent cardiac arrest with defibrillation but no evidence of worsening angina and elevation of troponin consistent with current issues and no apparent acute coronary syndrome on appropriate medication management 1.  Continue medication management for further risk reduction cardiovascular event without change 2.  Lexiscan infusion Myoview tomorrow for assessment of any significant new ischemic areas which may be related to above 3.  Further treatment  options after above  Signed, Serafina Royals M.D. FACC

## 2021-06-21 NOTE — Consult Note (Signed)
PHARMACY CONSULT NOTE - FOLLOW UP  Pharmacy Consult for Electrolyte Monitoring and Replacement   Recent Labs: Potassium (mmol/L)  Date Value  06/21/2021 3.5  11/08/2013 3.4 (L)   Magnesium (mg/dL)  Date Value  06/21/2021 1.9   Calcium (mg/dL)  Date Value  06/21/2021 8.2 (L)   Calcium, Total (mg/dL)  Date Value  11/08/2013 9.3   Albumin (g/dL)  Date Value  06/21/2021 2.6 (L)   Phosphorus (mg/dL)  Date Value  06/21/2021 2.8   Sodium (mmol/L)  Date Value  06/21/2021 137  11/08/2013 136     Assessment: 70 yo male presented to [facility] ER on 09/2 following a cardiac arrest.  Per ER notes pts wife reported while the pt was leaving his outpatient doctors office he felt like he was going to pass out.  He sat on his walker and lost consciousness.  Pts wife reported he briefly regained consciousness several times EMS notified.  When EMS arrived pt unresponsive and had a pulse, but required defibrillation x2. On a ventilator.     Goal of Therapy:  Mag ~2.0 K~ 4.0 WNL  Plan:  K 3.5  Mag 1.9  Phos 2.8 Scr 1.15  Will order Mag sulfate IV x 1 + KCL 20 mEq x 1  Follow up with AM labs.   Noralee Space ,PharmD Clinical Pharmacist 06/21/2021 12:19 PM

## 2021-06-22 LAB — LYME DISEASE SEROLOGY W/REFLEX: Lyme Total Antibody EIA: NEGATIVE

## 2021-06-22 LAB — HEPATIC FUNCTION PANEL
ALT: 20 U/L (ref 0–44)
AST: 18 U/L (ref 15–41)
Albumin: 2.8 g/dL — ABNORMAL LOW (ref 3.5–5.0)
Alkaline Phosphatase: 45 U/L (ref 38–126)
Bilirubin, Direct: 0.2 mg/dL (ref 0.0–0.2)
Indirect Bilirubin: 0.7 mg/dL (ref 0.3–0.9)
Total Bilirubin: 0.9 mg/dL (ref 0.3–1.2)
Total Protein: 6.5 g/dL (ref 6.5–8.1)

## 2021-06-22 LAB — CBC
HCT: 34.2 % — ABNORMAL LOW (ref 39.0–52.0)
Hemoglobin: 11.1 g/dL — ABNORMAL LOW (ref 13.0–17.0)
MCH: 30.8 pg (ref 26.0–34.0)
MCHC: 32.5 g/dL (ref 30.0–36.0)
MCV: 95 fL (ref 80.0–100.0)
Platelets: 162 10*3/uL (ref 150–400)
RBC: 3.6 MIL/uL — ABNORMAL LOW (ref 4.22–5.81)
RDW: 17.5 % — ABNORMAL HIGH (ref 11.5–15.5)
WBC: 6.2 10*3/uL (ref 4.0–10.5)
nRBC: 0 % (ref 0.0–0.2)

## 2021-06-22 LAB — GLUCOSE, CAPILLARY
Glucose-Capillary: 166 mg/dL — ABNORMAL HIGH (ref 70–99)
Glucose-Capillary: 101 mg/dL — ABNORMAL HIGH (ref 70–99)
Glucose-Capillary: 121 mg/dL — ABNORMAL HIGH (ref 70–99)
Glucose-Capillary: 108 mg/dL — ABNORMAL HIGH (ref 70–99)
Glucose-Capillary: 137 mg/dL — ABNORMAL HIGH (ref 70–99)

## 2021-06-22 LAB — BASIC METABOLIC PANEL
Anion gap: 7 (ref 5–15)
BUN: 16 mg/dL (ref 8–23)
CO2: 22 mmol/L (ref 22–32)
Calcium: 8.6 mg/dL — ABNORMAL LOW (ref 8.9–10.3)
Chloride: 108 mmol/L (ref 98–111)
Creatinine, Ser: 0.94 mg/dL (ref 0.61–1.24)
GFR, Estimated: >60 mL/min (ref >60)
Glucose, Bld: 115 mg/dL — ABNORMAL HIGH (ref 70–99)
Potassium: 3.5 mmol/L (ref 3.5–5.1)
Sodium: 137 mmol/L (ref 135–145)

## 2021-06-22 LAB — PHOSPHORUS: Phosphorus: 2.9 mg/dL (ref 2.5–4.6)

## 2021-06-22 LAB — MAGNESIUM: Magnesium: 2 mg/dL (ref 1.7–2.4)

## 2021-06-22 MED ORDER — ISOSORBIDE MONONITRATE ER 30 MG PO TB24
30.0000 mg | ORAL_TABLET | Freq: Every day | ORAL | Status: DC
  Administered 2021-06-22 – 2021-06-25 (×4): 30 mg via ORAL
  Filled 2021-06-22 (×4): qty 1

## 2021-06-22 MED ORDER — POTASSIUM CITRATE ER 10 MEQ (1080 MG) PO TBCR
40.0000 meq | EXTENDED_RELEASE_TABLET | Freq: Once | ORAL | Status: AC
  Administered 2021-06-22: 40 meq via ORAL
  Filled 2021-06-22: qty 4

## 2021-06-22 MED ORDER — LACTULOSE 10 GM/15ML PO SOLN
20.0000 g | Freq: Once | ORAL | Status: AC
  Administered 2021-06-22: 20 g via ORAL
  Filled 2021-06-22: qty 30

## 2021-06-22 MED ORDER — LATANOPROST 0.005 % OP SOLN
1.0000 [drp] | Freq: Every day | OPHTHALMIC | Status: DC
  Administered 2021-06-22 – 2021-06-25 (×4): 1 [drp] via OPHTHALMIC
  Filled 2021-06-22 (×3): qty 2.5

## 2021-06-22 MED ORDER — METOPROLOL TARTRATE 50 MG PO TABS: 50.0000 mg | ORAL_TABLET | Freq: Two times a day (BID) | ORAL | Status: DC

## 2021-06-22 MED ORDER — BRIMONIDINE TARTRATE 0.2 % OP SOLN
1.0000 [drp] | Freq: Two times a day (BID) | OPHTHALMIC | Status: DC
  Administered 2021-06-22 – 2021-06-25 (×8): 1 [drp] via OPHTHALMIC
  Filled 2021-06-22: qty 5

## 2021-06-22 MED ORDER — METOPROLOL SUCCINATE ER 50 MG PO TB24
50.0000 mg | ORAL_TABLET | Freq: Two times a day (BID) | ORAL | Status: DC
  Administered 2021-06-22 – 2021-06-25 (×7): 50 mg via ORAL
  Filled 2021-06-22 (×7): qty 1

## 2021-06-22 NOTE — Progress Notes (Signed)
[facility] Encounter Note  Patient: Damon English / Admit Date: 06/18/2021 / Date of Encounter: 06/22/2021, 12:52 PM   Subjective: 9/5 patient is done very well overnight with no evidence of significant chest pain shortness of breath or other significant symptoms.  Prior to this event the patient had no evidence of worsening angina, in fact was improving with his medical management and anginal symptoms.  Cardiac arrest was in the setting potassium of 2.2 and possible sepsis.  Troponin peak was 1533 possibly consistent with CPR.  Echocardiogram showing low normal LV systolic function ejection fraction of 50%.  Patient has tolerated appropriate medication management with no evidence of heart failure or angina since admission 9/6 no changes from yesterday with no evidence of rhythm disturbances or other significant chest discomfort or concerns.  Patient does complain of shortness of breath with physical activity multifactorial in nature and unchanged from before.  Patient had caffeine use therefore cannot perform stress test to evaluate and/or risk stratify for discharged home Review of Systems: Positive for: None Negative for: Vision change, hearing change, syncope, dizziness, nausea, vomiting,diarrhea, bloody stool, stomach pain, cough, congestion, diaphoresis, urinary frequency, urinary pain,skin lesions, skin rashes Others previously listed  Objective: Telemetry: Normal sinus rhythm Physical Exam: Blood pressure (!) 170/78, pulse 85, temperature 98.5 F (36.9 C), resp. rate 14, height '5\' 5"'$  (1.651 m), weight 104.7 kg, SpO2 100 %. Body mass index is 38.41 kg/m. General: Well developed, well nourished, in no acute distress. Head: Normocephalic, atraumatic, sclera non-icteric, no xanthomas, nares are without discharge. Neck: No apparent masses Lungs: Normal respirations with no wheezes, no rhonchi, no rales , no crackles   Heart: Regular rate and rhythm, normal S1 S2, no  murmur, no rub, no gallop, PMI is normal size and placement, carotid upstroke normal without bruit, jugular venous pressure normal Abdomen: Soft, non-tender, non-distended with normoactive bowel sounds. No hepatosplenomegaly. Abdominal aorta is normal size without bruit Extremities: No edema, no clubbing, no cyanosis, no ulcers,  Peripheral: 2+ radial, 2+ femoral, 2+ dorsal pedal pulses Neuro: Alert and oriented. Moves all extremities spontaneously. Psych:  Responds to questions appropriately with a normal affect.   Intake/Output Summary (Last 24 hours) at 06/22/2021 1252 Last data filed at 06/22/2021 1203 Gross per 24 hour  Intake 170 ml  Output 1300 ml  Net -1130 ml     Inpatient Medications:   acidophilus  2 capsule Oral TID   apixaban  5 mg Oral BID   brimonidine  1 drop Both Eyes BID   Chlorhexidine Gluconate Cloth  6 each Topical Daily   clopidogrel  75 mg Oral Daily   docusate sodium  100 mg Oral BID   doxycycline  100 mg Oral [id number]   folic acid  1 mg Oral Daily   guaiFENesin  600 mg Oral BID   insulin aspart  0-15 Units Subcutaneous TID AC & HS   latanoprost  1 drop Both Eyes QHS   levalbuterol  0.63 mg Nebulization Once   metoprolol succinate  25 mg Oral BID   multivitamin with minerals  1 tablet Oral Daily   pantoprazole (PROTONIX) IV  40 mg Intravenous QHS   potassium citrate  40 mEq Oral Once   [START ON 06/23/2021] thiamine injection  100 mg Intravenous Daily   Infusions:   sodium chloride     norepinephrine (LEVOPHED) Adult infusion Stopped (06/19/21 0948)   thiamine injection Stopped (06/22/21 1229)    Labs: Recent Labs    06/21/21 0431  06/22/21 0351  NA 137 137  K 3.5 3.5  CL 108 108  CO2 22 22  GLUCOSE 120* 115*  BUN 15 16  CREATININE 1.15 0.94  CALCIUM 8.2* 8.6*  MG 1.9 2.0  PHOS 2.8 2.9    Recent Labs    06/21/21 0431 06/22/21 0351  AST 19 18  ALT 23 20  ALKPHOS 45 45  BILITOT 1.0 0.9  PROT 5.9* 6.5  ALBUMIN 2.6* 2.8*    Recent Labs     06/21/21 0431 06/22/21 0351  WBC 7.0 6.2  HGB 10.4* 11.1*  HCT 30.9* 34.2*  MCV 95.7 95.0  PLT 149* 162    No results for input(s): CKTOTAL, CKMB, TROPONINI in the last 72 hours. Invalid input(s): POCBNP No results for input(s): HGBA1C in the last 72 hours.    Weights: Filed Weights   06/20/21 0410 06/21/21 0505 06/22/21 0623  Weight: 98.8 kg 104.2 kg 104.7 kg     Radiology/Studies:  DG Abdomen 1 View  Result Date: 06/18/2021 CLINICAL DATA:  Orogastric tube placement EXAM: ABDOMEN - 1 VIEW COMPARISON:  6:26 p.m. FINDINGS: Orogastric tube tip now overlies the gastric fundus. Visualized lung bases are clear. No free intraperitoneal gas. The abdominal gas pattern is indeterminate due to a paucity of intra-abdominal gas. Pelvis excluded from view. IMPRESSION: Orogastric tube tip within the gastric fundus. Electronically Signed   By: Fidela Salisbury M.D.   On: 06/18/2021 21:15   DG Abd 1 View  Result Date: 06/18/2021 CLINICAL DATA:  Encounter for NG tube placement EXAM: ABDOMEN - 1 VIEW COMPARISON:  None. FINDINGS: Defibrillator pads overlie the upper abdomen. There is no evidence of bowel obstruction. There is no visible NG tube. The NG tube appears to be overlying the midesophagus on separately dictated chest radiograph. Prior L3-L5 fusion. No acute osseous abnormality. Bilateral iliac stents IMPRESSION: No visible nasogastric tube overlying the abdomen. See separately dictated chest radiograph. Electronically Signed   By: Maurine Simmering M.D.   On: 06/18/2021 18:53   CT HEAD WO CONTRAST (5MM)  Result Date: 06/18/2021 CLINICAL DATA:  Mental status change.  Patient intubated EXAM: CT HEAD WITHOUT CONTRAST TECHNIQUE: Contiguous axial images were obtained from the base of the skull through the vertex without intravenous contrast. COMPARISON:  None. FINDINGS: Brain: Hypodensity left parietal lobe involving cortex and white matter compatible with chronic infarct. Patchy white matter hypodensity  bilaterally likely chronic. Negative for hydrocephalus. Negative for acute infarct, hemorrhage or mass. 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 Electronically Signed   By: Franchot Gallo M.D.   On: 06/18/2021 16:20   CT Angio Chest PE W and/or Wo Contrast  Addendum Date: 06/19/2021   ADDENDUM REPORT: 06/19/2021 10:11 ADDENDUM: Upon further review, there is a mildly displaced acute fracture of the right 6th rib anteriorly (image 64/6). In addition, there are possible nondisplaced fractures the right anterior 4th, 5th and 7th ribs. There is also a probable fracture of the left 6th rib anteriorly. No pneumothorax or pleural effusion. These results were discussed by telephone at the time of interpretation on 06/19/2021 at 09:55 am with provider Derrill Kay, who verbally acknowledged these results. Electronically Signed   By: Richardean Sale M.D.   On: 06/19/2021 10:11   Result Date: 06/19/2021 CLINICAL DATA:  PE suspected, high probability.  Intubated patient. EXAM: CT ANGIOGRAPHY CHEST WITH CONTRAST TECHNIQUE: Multidetector CT imaging  of the chest was performed using the standard protocol during bolus administration of intravenous contrast. Multiplanar CT image reconstructions and MIPs were obtained to evaluate the vascular anatomy. CONTRAST:  100m OMNIPAQUE IOHEXOL 350 MG/ML SOLN COMPARISON:  Radiographs 01/28/2019. FINDINGS: Cardiovascular: The pulmonary arteries are well opacified with contrast to the level of the subsegmental branches. There is no evidence of acute pulmonary embolism. There is essentially no contrast opacification of the systemic arterial system. Patient is status post median sternotomy and CABG. There is diffuse atherosclerosis of the aorta, great vessels and coronary arteries. There is some reflux of contrast  into the IVC and hepatic veins. The heart size is normal. There is no pericardial effusion. Mediastinum/Nodes: Tip of the endotracheal tube is in the mid trachea. There are no enlarged mediastinal, hilar or axillary lymph nodes. The thyroid gland, trachea and esophagus demonstrate no significant findings. Lungs/Pleura: There is no pleural effusion. Mild centrilobular emphysema with dependent pulmonary opacities bilaterally, favoring atelectasis. No consolidation or suspicious pulmonary nodularity. Triangular-shaped 4 mm right middle lobe nodule on image 47/6, likely benign. Upper abdomen: As above, there is some reflux of contrast into the IVC and hepatic veins. The liver contours are mildly irregular. The gallbladder is surgically absent. Musculoskeletal/Chest wall: There is no chest wall mass or suspicious osseous finding. Previous median sternotomy. Multilevel thoracic spondylosis. Review of the MIP images confirms the above findings. IMPRESSION: 1. No evidence of acute pulmonary embolism or other acute chest process. 2. Diffuse coronary and Aortic Atherosclerosis (ICD10-I70.0). Previous CABG. 3. Mild emphysema with probable mild dependent atelectasis in both lungs. 4. Potential changes of early cirrhosis. Electronically Signed: By: WRichardean SaleM.D. On: 06/18/2021 16:20   CT FEMUR RIGHT WO CONTRAST  Result Date: 06/18/2021 CLINICAL DATA:  Recent insect bite with fever and right lower extremity pain and edema. EXAM: CT OF THE RIGHT FEMUR, LOWER LEG AND FOOT WITHOUT CONTRAST TECHNIQUE: Multidetector CT imaging of the right lower extremity was performed according to the standard protocol. The images extend from the lower right pelvis through the right foot. Multiplanar CT image reconstructions were also generated. COMPARISON:  None. FINDINGS: Bones/Joint/Cartilage No evidence of acute fracture or dislocation. Status post proximal right femoral dynamic plate and screw fixation of an intertrochanteric femur  fracture. The hardware is intact without loosening. There is irregular periosteal ossification anteriorly, likely chronic and related to the original injury. The distal femur, tibia, fibula and foot demonstrate no significant findings. No large joint effusions are seen. Ligaments Suboptimally assessed by CT. Muscles and Tendons No focal muscular atrophy or fluid collection identified. The extensor mechanism is intact at the knee. The ankle tendons appear intact and normally located. Soft tissues Bilateral external iliac stents are noted with postsurgical changes from right femoropopliteal bypass grafting. There is extensive atherosclerosis of the underlying native arteries in the right lower extremity. Foley catheter is in place with mild bladder wall thickening. There is contrast in the bladder related to the preceding chest CTA. Postsurgical changes are present in the subcutaneous fat lateral to the proximal right femur, attributed to the previous ORIF. No inflammatory changes or fluid collections are seen within the thigh. There is diffuse subcutaneous edema within the lower leg, greatest distally. No focal fluid collection, soft tissue emphysema or foreign body identified. This edema extends into the foot. IMPRESSION: 1. Diffuse subcutaneous edema within the lower leg and foot consistent with soft tissue infection (cellulitis). No focal fluid collection or unexpected foreign body identified. 2. No evidence of deep soft tissue  infection, septic arthritis or osteomyelitis. 3. Previous right proximal femoral ORIF without acute osseous findings. 4. Extensive atherosclerosis post right femoropopliteal bypass grafting. Electronically Signed   By: Richardean Sale M.D.   On: 06/18/2021 20:52   CT TIBIA FIBULA RIGHT WO CONTRAST  Result Date: 06/18/2021 CLINICAL DATA:  Recent insect bite with fever and right lower extremity pain and edema. EXAM: CT OF THE RIGHT FEMUR, LOWER LEG AND FOOT WITHOUT CONTRAST TECHNIQUE:  Multidetector CT imaging of the right lower extremity was performed according to the standard protocol. The images extend from the lower right pelvis through the right foot. Multiplanar CT image reconstructions were also generated. COMPARISON:  None. FINDINGS: Bones/Joint/Cartilage No evidence of acute fracture or dislocation. Status post proximal right femoral dynamic plate and screw fixation of an intertrochanteric femur fracture. The hardware is intact without loosening. There is irregular periosteal ossification anteriorly, likely chronic and related to the original injury. The distal femur, tibia, fibula and foot demonstrate no significant findings. No large joint effusions are seen. Ligaments Suboptimally assessed by CT. Muscles and Tendons No focal muscular atrophy or fluid collection identified. The extensor mechanism is intact at the knee. The ankle tendons appear intact and normally located. Soft tissues Bilateral external iliac stents are noted with postsurgical changes from right femoropopliteal bypass grafting. There is extensive atherosclerosis of the underlying native arteries in the right lower extremity. Foley catheter is in place with mild bladder wall thickening. There is contrast in the bladder related to the preceding chest CTA. Postsurgical changes are present in the subcutaneous fat lateral to the proximal right femur, attributed to the previous ORIF. No inflammatory changes or fluid collections are seen within the thigh. There is diffuse subcutaneous edema within the lower leg, greatest distally. No focal fluid collection, soft tissue emphysema or foreign body identified. This edema extends into the foot. IMPRESSION: 1. Diffuse subcutaneous edema within the lower leg and foot consistent with soft tissue infection (cellulitis). No focal fluid collection or unexpected foreign body identified. 2. No evidence of deep soft tissue infection, septic arthritis or osteomyelitis. 3. Previous right  proximal femoral ORIF without acute osseous findings. 4. Extensive atherosclerosis post right femoropopliteal bypass grafting. Electronically Signed   By: Richardean Sale M.D.   On: 06/18/2021 20:52   CT FOOT RIGHT WO CONTRAST  Result Date: 06/18/2021 CLINICAL DATA:  Recent insect bite with fever and right lower extremity pain and edema. EXAM: CT OF THE RIGHT FEMUR, LOWER LEG AND FOOT WITHOUT CONTRAST TECHNIQUE: Multidetector CT imaging of the right lower extremity was performed according to the standard protocol. The images extend from the lower right pelvis through the right foot. Multiplanar CT image reconstructions were also generated. COMPARISON:  None. FINDINGS: Bones/Joint/Cartilage No evidence of acute fracture or dislocation. Status post proximal right femoral dynamic plate and screw fixation of an intertrochanteric femur fracture. The hardware is intact without loosening. There is irregular periosteal ossification anteriorly, likely chronic and related to the original injury. The distal femur, tibia, fibula and foot demonstrate no significant findings. No large joint effusions are seen. Ligaments Suboptimally assessed by CT. Muscles and Tendons No focal muscular atrophy or fluid collection identified. The extensor mechanism is intact at the knee. The ankle tendons appear intact and normally located. Soft tissues Bilateral external iliac stents are noted with postsurgical changes from right femoropopliteal bypass grafting. There is extensive atherosclerosis of the underlying native arteries in the right lower extremity. Foley catheter is in place with mild bladder wall thickening. There is  contrast in the bladder related to the preceding chest CTA. Postsurgical changes are present in the subcutaneous fat lateral to the proximal right femur, attributed to the previous ORIF. No inflammatory changes or fluid collections are seen within the thigh. There is diffuse subcutaneous edema within the lower leg,  greatest distally. No focal fluid collection, soft tissue emphysema or foreign body identified. This edema extends into the foot. IMPRESSION: 1. Diffuse subcutaneous edema within the lower leg and foot consistent with soft tissue infection (cellulitis). No focal fluid collection or unexpected foreign body identified. 2. No evidence of deep soft tissue infection, septic arthritis or osteomyelitis. 3. Previous right proximal femoral ORIF without acute osseous findings. 4. Extensive atherosclerosis post right femoropopliteal bypass grafting. Electronically Signed   By: Richardean Sale M.D.   On: 06/18/2021 20:52   DG Chest Port 1 View  Result Date: 06/21/2021 CLINICAL DATA:  Cough. EXAM: PORTABLE CHEST 1 VIEW COMPARISON:  Heart is enlarged. Atherosclerotic changes are present at the aortic arch. Lung volumes are low. No edema or effusion is present. No focal airspace disease is present. FINDINGS: Heart is enlarged. Atherosclerotic changes are present at the aortic arch. Lung volumes are low. No edema or effusion is present. No focal airspace disease is present. IMPRESSION: 1. Cardiomegaly without failure. 2. Low lung volumes. Electronically Signed   By: San Morelle M.D.   On: 06/21/2021 16:09   DG Chest Port 1 View  Result Date: 06/18/2021 CLINICAL DATA:  Encounter for ET tube and OG tube placement EXAM: PORTABLE CHEST 1 VIEW COMPARISON:  Same day CT chest FINDINGS: Endotracheal tube overlies the midthoracic trachea. There is an orogastric tube with tip overlying the mid esophagus. Fibular pads overlie the left lower chest. Enlarged cardiac silhouette. Bibasilar subsegmental atelectasis. There is no focal airspace consolidation. There is no large pleural effusion or visible pneumothorax. There is no acute osseous abnormality. IMPRESSION: Endotracheal tube tip overlies the midthoracic trachea. Orogastric tube tip overlies the mid esophagus, recommend advancement. Electronically Signed   By: Maurine Simmering  M.D.   On: 06/18/2021 18:56   VAS [place] ABI WITH/WO TBI  Result Date: 06/22/2021  LOWER EXTREMITY DOPPLER STUDY Patient Name:  Damon English  Date of Exam:   06/18/2021 Medical Rec #: [id number]         Accession #:    [id number] Date of Birth: 1951/01/24          Patient Gender: M Patient Age:   89 years Exam Location:  [facility] Procedure:      VAS [place] ABI WITH/WO TBI Referring Phys: Corene Cornea DEW --------------------------------------------------------------------------------  Indications: Peripheral artery disease.  Vascular Interventions: 01/14/2021 Bilateral CIA and EIA stents. Comparison Study: 01/2021 Performing Technologist: Concha Norway RVT  Examination Guidelines: A complete evaluation includes at minimum, Doppler waveform signals and systolic blood pressure reading at the level of bilateral brachial, anterior tibial, and posterior tibial arteries, when vessel segments are accessible. Bilateral testing is considered an integral part of a complete examination. Photoelectric Plethysmograph (PPG) waveforms and toe systolic pressure readings are included as required and additional duplex testing as needed. Limited examinations for reoccurring indications may be performed as noted.  ABI Findings: +---------+------------------+-----+--------+--------+ Right    Rt Pressure (mmHg)IndexWaveformComment  +---------+------------------+-----+--------+--------+ Brachial 141                                     +---------+------------------+-----+--------+--------+ ATA  160                    biphasic1.13     +---------+------------------+-----+--------+--------+ PTA      181               1.28 biphasic         +---------+------------------+-----+--------+--------+ Ellen Henri               0.79 Normal           +---------+------------------+-----+--------+--------+ +---------+------------------+-----+----------+-------+ Left     Lt Pressure (mmHg)IndexWaveform  Comment  +---------+------------------+-----+----------+-------+ ATA      112                    monophasic.79     +---------+------------------+-----+----------+-------+ PTA      107               0.76 monophasic        +---------+------------------+-----+----------+-------+ Great Toe53                0.38 Abnormal          +---------+------------------+-----+----------+-------+ +-------+-----------+-----------+------------+------------+ ABI/TBIToday's ABIToday's TBIPrevious ABIPrevious TBI +-------+-----------+-----------+------------+------------+ Right  1.28       .79        1.03        .74          +-------+-----------+-----------+------------+------------+ Left   .79        .39        .62         .23          +-------+-----------+-----------+------------+------------+  Left ABIs and TBIs appear increased compared to prior study on 01/2021.  Summary: Right: Resting right ankle-brachial index is within normal range. No evidence of significant right lower extremity arterial disease. The right toe-brachial index is normal. Duplex added EIA patent. Ltd look at calf veins with normal compression. Left: Duplex added EIA and stent, CFA patent.  *See table(s) above for measurements and observations.  Electronically signed by [name] MD on 06/22/2021 at 11:15:03 AM.    Final    ECHOCARDIOGRAM COMPLETE  Result Date: 06/19/2021    ECHOCARDIOGRAM REPORT   Patient Name:   Damon English Date of Exam: 06/19/2021 Medical Rec #:  [id number]        Height:       65.0 in Accession #:    [id number]       Weight:       214.3 lb Date of Birth:  05/18/1951         BSA:          2.037 m Patient Age:    16 years         BP:           124/45 mmHg Patient Gender: M                HR:           65 bpm. Exam Location:  [facility] Procedure: 2D Echo and Intracardiac Opacification Agent Indications:     Cardiac arrest I46.9  History:         Patient has prior history of Echocardiogram examinations, most                   recent 01/28/2019.  Sonographer:     Kathlen Brunswick RDCS Referring Phys:  [id number] [place] E GRAVES Diagnosing Phys: Kate Sable MD  Sonographer Comments: Technically challenging study due to limited acoustic windows and  echo performed with patient supine and on artificial respirator. IMPRESSIONS  1. Left ventricular ejection fraction, by estimation, is 50%. The left ventricle has low normal function. The left ventricle has no regional wall motion abnormalities. Left ventricular diastolic function could not be evaluated.  2. Right ventricular systolic function is low normal. The right ventricular size is not well visualized.  3. The mitral valve is normal in structure. No evidence of mitral valve regurgitation.  4. The aortic valve is grossly normal. Aortic valve regurgitation is not visualized. FINDINGS  Left Ventricle: Left ventricular ejection fraction, by estimation, is 50%. The left ventricle has low normal function. The left ventricle has no regional wall motion abnormalities. Definity contrast agent was given IV to delineate the left ventricular endocardial borders. The left ventricular internal cavity size was normal in size. There is no left ventricular hypertrophy. Left ventricular diastolic function could not be evaluated. Right Ventricle: The right ventricular size is not well visualized. No increase in right ventricular wall thickness. Right ventricular systolic function is low normal. Left Atrium: Left atrial size was normal in size. Right Atrium: Right atrial size was not well visualized. Pericardium: There is no evidence of pericardial effusion. Mitral Valve: The mitral valve is normal in structure. No evidence of mitral valve regurgitation. Tricuspid Valve: The tricuspid valve is not well visualized. Tricuspid valve regurgitation is not demonstrated. Aortic Valve: The aortic valve is grossly normal. Aortic valve regurgitation is not visualized. Pulmonic Valve: The pulmonic valve was not well  visualized. Pulmonic valve regurgitation is not visualized. Aorta: The aortic root is normal in size and structure. Venous: IVC assessment for right atrial pressure unable to be performed due to mechanical ventilation. IAS/Shunts: The interatrial septum was not well visualized.  LEFT VENTRICLE PLAX 2D LVIDd:         5.40 cm LVIDs:         4.20 cm LV PW:         1.10 cm LV IVS:        1.00 cm LVOT diam:     2.20 cm LVOT Area:     3.80 cm  LEFT ATRIUM         Index LA diam:    3.70 cm 1.82 cm/m   AORTA Ao Root diam: 3.40 cm Ao Asc diam:  3.40 cm  SHUNTS Systemic Diam: 2.20 cm Kate Sable MD Electronically signed by Kate Sable MD Signature Date/Time: 06/19/2021/3:43:59 PM    Final    VAS [place] LOWER EXTREMITY ARTERIAL DUPLEX  Result Date: 06/22/2021 LOWER EXTREMITY ARTERIAL DUPLEX STUDY Patient Name:  Damon English  Date of Exam:   06/18/2021 Medical Rec #: [id number]         Accession #:    [id number] Date of Birth: Oct 07, 1951          Patient Gender: M Patient Age:   14 years Exam Location:   [facility] Procedure:      VAS [place] LOWER EXTREMITY ARTERIAL DUPLEX Referring Phys: [name] --------------------------------------------------------------------------------  Indications: Peripheral artery disease.  Vascular Interventions: Bilat CIA EIA STENTS. Current ABI:            r- 1.28 ; l - .79 Performing Technologist: Concha Norway RVT  Examination Guidelines: A complete evaluation includes B-mode imaging, spectral Doppler, color Doppler, and power Doppler as needed of all accessible portions of each vessel. Bilateral testing is considered an integral part of a complete examination. Limited examinations for reoccurring indications may be performed as noted.  +----------+--------+-----+--------+--------+--------+  RIGHT     PSV cm/sRatioStenosisWaveformComments +----------+--------+-----+--------+--------+--------+ EIA Prox  256                  biphasic          +----------+--------+-----+--------+--------+--------+ EIA Mid   194                  biphasic         +----------+--------+-----+--------+--------+--------+ EIA Distal175                  biphasic         +----------+--------+-----+--------+--------+--------+ CFA Mid   73                                    +----------+--------+-----+--------+--------+--------+ POP Mid   81                   biphasic         +----------+--------+-----+--------+--------+--------+ PTA Distal84                   biphasic         +----------+--------+-----+--------+--------+--------+  +----------+--------+-----+--------+----------+--------+ LEFT      PSV cm/sRatioStenosisWaveform  Comments +----------+--------+-----+--------+----------+--------+ EIA Prox  239                  biphasic           +----------+--------+-----+--------+----------+--------+ EIA Mid   230                  biphasic           +----------+--------+-----+--------+----------+--------+ EIA Distal131                  biphasic           +----------+--------+-----+--------+----------+--------+ CFA Mid   148                  biphasic           +----------+--------+-----+--------+----------+--------+ SFA Mid   92                   monophasic         +----------+--------+-----+--------+----------+--------+ POP Mid   38                                      +----------+--------+-----+--------+----------+--------+ POP Distal                     monophasic         +----------+--------+-----+--------+----------+--------+ PTA Distal45                   monophasic         +----------+--------+-----+--------+----------+--------+  Summary: Right: Moderate atherosclerosis. Biphasic flow in EIA CFA showimg evidence of patent iliac stents. Left: Moderate atherosclerosis. Biphasic flow in EIA CFA showimg evidence of patent iliac stents.  See table(s) above for measurements and  observations. Electronically signed by [name] MD on 06/22/2021 at 9:14:38 AM.    Final    US Abdomen Limited RUQ (LIVER/GB)  Result Date: 06/18/2021 CLINICAL DATA:  Elevated liver enzymes EXAM: ULTRASOUND ABDOMEN LIMITED RIGHT UPPER QUADRANT COMPARISON:  None. FINDINGS: Gallbladder: Absent Common bile duct: Diameter: 5 mm in proximal diameter Liver: The hepatic parenchymal echogenicity is diffusely increased, the echotexture is mildly coarsened, and there is poor acoustic  through transmission most in keeping with changes of moderate hepatic steatosis. No focal intrahepatic masses are identified. There is no intrahepatic biliary ductal dilation. Portal vein is patent on color Doppler imaging with normal direction of blood flow towards the liver. Other: No ascites IMPRESSION: Status post cholecystectomy. Moderate hepatic steatosis. Electronically Signed   By: Fidela Salisbury M.D.   On: 06/18/2021 20:03     Assessment and Recommendation  70 y.o. male with known previous myocardial infarction having right leg infection dehydration hypokalemia and apparent cardiac arrest with defibrillation but no evidence of worsening angina and elevation of troponin consistent with current issues and no apparent acute coronary syndrome on appropriate medication management 1.  Continue medication management for further risk reduction cardiovascular event with addition of isosorbide 2.  Lexiscan infusion Myoview tomorrow for assessment of any significant new ischemic areas which may be related to above 3.  Further treatment options after above  Signed, Serafina Royals M.D. FACC
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
## 2021-06-22 NOTE — Progress Notes (Signed)
PROGRESS NOTE    Damon English  [id number] DOB: August 23, 1951 DOA: 06/18/2021 PCP: [name], PA-C   Chief complaint.  Cardiac arrest. Brief Narrative:  Patient is a 70 year old male with history of alcohol abuse, chronic systolic congestive heart failure, coronary artery disease, type 2 diabetes, who presents to the hospital after cardiac arrest.  He received defibrillation by EMS prior to arrival in the hospital.  She was intubated after arriving the emergency room. Patient also had a potassium of 2.2, which is presumed to be the cardiac arrest.  CT angiogram of the chest did not show PE.   Assessment & Plan:   Active Problems:   NSTEMI (non-ST elevated myocardial infarction) (HCC)   Chronic systolic CHF (congestive heart failure) (HCC)   Ischemic cardiomyopathy   Type 2 diabetes mellitus (HCC)   Cardiac arrest (HCC)   ETOH abuse   Acute respiratory failure with hypoxia and hypercapnia (HCC)   On mechanically assisted ventilation (HCC)   Cellulitis of leg, right   Sepsis ([place])   Acute kidney injury (AKI) with acute tubular necrosis (ATN) (HCC)   Acute encephalopathy   Hypokalemia   Transaminitis   Pressure injury of skin  #1.  Cardiac arrest with ventricular fibrillation secondary to severe hypokalemia. Elevated troponin secondary to cardiac arrest. Complete AV block, resolved. Chronic systolic congestive heart failure.  Orthostatic hypotension. Coronary artery disease status post CABG. Patient condition had improved, repeated echocardiogram showed ejection fraction 50%, no regional motion abnormality. Cardiology has scheduled stress test tomorrow. Will be medically stable to be discharged after stress test.  2.  Severe hypokalemia. Acute kidney injury secondary to ATN. Potassium 3.5 today, will give her 40 mEq oral today.  Magnesium normal.  Renal function has improved.  3.  Sepsis is secondary to right lower extremity cellulitis. Right lower extremity  cellulitis. Recent tick bite. Based on my exam, patient cellulitis has totally resolved.  I will discontinue cefepime.  We will continue complete 2 weeks of doxycycline due to recent tick bite.  #4.  Acute metabolic encephalopathies. Alcohol use disorder. Condition has been improved.  #5.  Type 2 diabetes. Continue current regimen.  6.  Liver cirrhosis.  #7 history of DVT. On Eliquis.    DVT prophylaxis: Eliquis Code Status: full Family Communication:  Disposition Plan:    Status is: Inpatient  Remains inpatient appropriate because:Ongoing diagnostic testing needed not appropriate for outpatient work up and Inpatient level of care appropriate due to severity of illness  Dispo: The patient is from: Home              Anticipated d/c is to: SNF              Patient currently is not medically stable to d/c.   Difficult to place patient No        I/O last 3 completed shifts: In: 3 [P.O.:170; IV Piggyback:350] Out: 1350 [Urine:1350] No intake/output data recorded.     Consultants:  Cardiology  Procedures: None  Antimicrobials: Doxycycline  Subjective: Patient condition had improved, currently patient does not have any confusion.  He slept well last night. Denies any short of breath or cough. No signal chest pain, no cough. No fever or chills. No dysuria or hematuria  No abdominal pain or nausea vomiting.  No diarrhea.  Objective: Vitals:   06/21/21 2329 06/22/21 0347 06/22/21 0623 06/22/21 0735  BP: (!) 169/72 (!) 173/79  (!) 166/87  Pulse: 87 88  86  Resp: 20 18  14  Temp: 98.4 F (36.9 C) 98.3 F (36.8 C)  98 F (36.7 C)  TempSrc: Oral Oral    SpO2: 97% 98%  97%  Weight:   104.7 kg   Height:        Intake/Output Summary (Last 24 hours) at 06/22/2021 1143 Last data filed at 06/22/2021 [id number] Gross per 24 hour  Intake 170 ml  Output 1025 ml  Net -855 ml   Filed Weights   06/20/21 0410 06/21/21 0505 06/22/21 0623  Weight: 98.8 kg 104.2 kg  104.7 kg    Examination:  General exam: Appears calm and comfortable  Respiratory system: Clear to auscultation. Respiratory effort normal. Cardiovascular system: S1 & S2 heard, RRR. No JVD, murmurs, rubs, gallops or clicks. No pedal edema. Gastrointestinal system: Abdomen is nondistended, soft and nontender. No organomegaly or masses felt. Normal bowel sounds heard. Central nervous system: Alert and oriented x3. No focal neurological deficits. Extremities: Symmetric 5 x 5 power. Skin: No rashes, lesions or ulcers Psychiatry: Judgement and insight appear normal. Mood & affect appropriate.     Data Reviewed: I have personally reviewed following labs and imaging studies  CBC: Recent Labs  Lab 06/18/21 1513 06/18/21 2100 06/19/21 0630 06/20/21 0225 06/21/21 0431 06/22/21 0351  WBC 12.0* 10.6* 10.9* 7.9 7.0 6.2  NEUTROABS 8.5*  --   --   --   --   --   HGB 14.5 14.6 12.3* 11.3* 10.4* 11.1*  HCT 43.4 42.6 34.6* 33.8* 30.9* 34.2*  MCV 91.9 93.2 92.0 96.6 95.7 95.0  PLT 201 173 156 136* 149* 0000000   Basic Metabolic Panel: Recent Labs  Lab 06/18/21 1513 06/18/21 1731 06/18/21 2100 06/18/21 2358 06/19/21 0630 06/19/21 1657 06/20/21 0225 06/20/21 1625 06/21/21 0431 06/22/21 0351  NA 135  --   --   --  139  --  136  --  137 137  K 2.2*  --   --    < > 2.8* 2.9* 3.0* 3.4* 3.5 3.5  CL 91*  --   --   --  101  --  103  --  108 108  CO2 29  --   --   --  28  --  26  --  22 22  GLUCOSE 240*  --   --   --  147*  --  108*  --  120* 115*  BUN 20  --   --   --  21  --  17  --  15 16  CREATININE 1.71*  --  1.64*  --  1.48*  --  1.34*  --  1.15 0.94  CALCIUM 9.5  --   --   --  8.5*  --  8.0*  --  8.2* 8.6*  MG  --    < >  --   --  1.7 2.1 2.0  --  1.9 2.0  PHOS  --   --   --   --  3.3  --  2.4*  --  2.8 2.9   < > = values in this interval not displayed.   GFR: Estimated Creatinine Clearance: 83.8 mL/min (by C-G formula based on SCr of 0.94 mg/dL). Liver Function Tests: Recent  Labs  Lab 06/18/21 1513 06/19/21 0630 06/20/21 0225 06/21/21 0431 06/22/21 0351  AST 99* 41 '30 19 18  '$ ALT 77* 42 34 23 20  ALKPHOS 68 46 48 45 45  BILITOT 1.4* 0.8 1.0 1.0 0.9  PROT 8.4* 5.7* 6.2* 5.9* 6.5  ALBUMIN 4.1 2.4* 2.8* 2.6* 2.8*   No results for input(s): LIPASE, AMYLASE in the last 168 hours. No results for input(s): AMMONIA in the last 168 hours. Coagulation Profile: Recent Labs  Lab 06/18/21 1731  INR 1.6*   Cardiac Enzymes: No results for input(s): CKTOTAL, CKMB, CKMBINDEX, TROPONINI in the last 168 hours. BNP (last 3 results) No results for input(s): PROBNP in the last 8760 hours. HbA1C: No results for input(s): HGBA1C in the last 72 hours. CBG: Recent Labs  Lab 06/20/21 2243 06/21/21 1332 06/21/21 1605 06/21/21 2046 06/22/21 0739  GLUCAP 104* 128* 121* 121* 101*   Lipid Profile: No results for input(s): CHOL, HDL, LDLCALC, TRIG, CHOLHDL, LDLDIRECT in the last 72 hours. Thyroid Function Tests: No results for input(s): TSH, T4TOTAL, FREET4, T3FREE, THYROIDAB in the last 72 hours. Anemia Panel: No results for input(s): VITAMINB12, FOLATE, FERRITIN, TIBC, IRON, RETICCTPCT in the last 72 hours. Sepsis Labs: Recent Labs  Lab 06/18/21 1731 06/18/21 2000 06/18/21 2100 06/19/21 0630 06/20/21 0225  PROCALCITON  --   --  0.31 0.38 0.25  LATICACIDVEN 2.6* 2.1*  --  1.3  --     Recent Results (from the past 240 hour(s))  Resp Panel by RT-PCR (Flu A&B, Covid) Nasopharyngeal Swab     Status: None   Collection Time: 06/18/21  3:12 PM   Specimen: Nasopharyngeal Swab; Nasopharyngeal(NP) swabs in vial transport medium  Result Value Ref Range Status   SARS Coronavirus 2 by RT PCR NEGATIVE NEGATIVE Final    Comment: (NOTE) SARS-CoV-2 target nucleic acids are NOT DETECTED.  The SARS-CoV-2 RNA is generally detectable in upper respiratory specimens during the acute phase of infection. The lowest concentration of SARS-CoV-2 viral copies this assay can detect  is 138 copies/mL. A negative result does not preclude SARS-Cov-2 infection and should not be used as the sole basis for treatment or other patient management decisions. A negative result may occur with  improper specimen collection/handling, submission of specimen other than nasopharyngeal swab, presence of viral mutation(s) within the areas targeted by this assay, and inadequate number of viral copies(<138 copies/mL). A negative result must be combined with clinical observations, patient history, and epidemiological information. The expected result is Negative.  Fact Sheet for Patients:  EntrepreneurPulse.com.au  Fact Sheet for Healthcare Providers:  IncredibleEmployment.be  This test is no t yet approved or cleared by the [place] FDA and  has been authorized for detection and/or diagnosis of SARS-CoV-2 by FDA under an Emergency Use Authorization (EUA). This EUA will remain  in effect (meaning this test can be used) for the duration of the COVID-19 declaration under Section 564(b)(1) of the Act, 21 U.S.C.section 360bbb-3(b)(1), unless the authorization is terminated  or revoked sooner.       Influenza A by PCR NEGATIVE NEGATIVE Final   Influenza B by PCR NEGATIVE NEGATIVE Final    Comment: (NOTE) The Xpert Xpress SARS-CoV-2/FLU/RSV plus assay is intended as an aid in the diagnosis of influenza from Nasopharyngeal swab specimens and should not be used as a sole basis for treatment. Nasal washings and aspirates are unacceptable for Xpert Xpress SARS-CoV-2/FLU/RSV testing.  Fact Sheet for Patients: EntrepreneurPulse.com.au  Fact Sheet for Healthcare Providers: IncredibleEmployment.be  This test is not yet approved or cleared by the [place] FDA and has been authorized for detection and/or diagnosis of SARS-CoV-2 by FDA under an Emergency Use Authorization (EUA). This EUA will remain in effect  (meaning this test can be used) for the duration of the COVID-19 declaration  under Section 564(b)(1) of the Act, 21 U.S.C. section 360bbb-3(b)(1), unless the authorization is terminated or revoked.  Performed at [facility], [address] [place], [place] [address]   Blood Culture (routine x 2)     Status: None (Preliminary result)   Collection Time: 06/18/21  5:31 PM   Specimen: BLOOD  Result Value Ref Range Status   Specimen Description BLOOD RIGHT HAND  Final   Special Requests   Final    BOTTLES DRAWN AEROBIC AND ANAEROBIC Blood Culture adequate volume   Culture   Final    NO GROWTH 4 DAYS Performed at [facility], [address] [place], [place] [address]    Report Status PENDING  Incomplete  Blood Culture (routine x 2)     Status: None (Preliminary result)   Collection Time: 06/18/21  5:31 PM   Specimen: BLOOD  Result Value Ref Range Status   Specimen Description BLOOD RIGHT [facility]  Final   Special Requests   Final    BOTTLES DRAWN AEROBIC AND ANAEROBIC Blood Culture adequate volume   Culture   Final    NO GROWTH 4 DAYS Performed at [facility], [address], [place], [place] [address]    Report Status PENDING  Incomplete  Urine Culture     Status: None   Collection Time: 06/18/21  5:31 PM   Specimen: In/Out Cath Urine  Result Value Ref Range Status   Specimen Description   Final    IN/OUT CATH URINE Performed at [facility], [address] [place], [place] [address]    Special Requests   Final    NONE Performed at [facility], [address], [place], [place] [address]    Culture   Final    NO GROWTH Performed at [facility], [place] [address], [place], [place] [address]    Report Status 06/20/2021 FINAL  Final         Radiology Studies: DG Chest Port 1 View  Result Date: 06/21/2021 CLINICAL DATA:  Cough. EXAM: PORTABLE CHEST 1 VIEW COMPARISON:  Heart is enlarged. Atherosclerotic changes are  present at the aortic arch. Lung volumes are low. No edema or effusion is present. No focal airspace disease is present. FINDINGS: Heart is enlarged. Atherosclerotic changes are present at the aortic arch. Lung volumes are low. No edema or effusion is present. No focal airspace disease is present. IMPRESSION: 1. Cardiomegaly without failure. 2. Low lung volumes. Electronically Signed   By: San Morelle M.D.   On: 06/21/2021 16:09        Scheduled Meds:  acidophilus  2 capsule Oral TID   apixaban  5 mg Oral BID   Chlorhexidine Gluconate Cloth  6 each Topical Daily   clopidogrel  75 mg Oral Daily   docusate sodium  100 mg Oral BID   doxycycline  100 mg Oral [id number]   folic acid  1 mg Oral Daily   guaiFENesin  600 mg Oral BID   insulin aspart  0-15 Units Subcutaneous TID AC & HS   levalbuterol  0.63 mg Nebulization Once   metoprolol succinate  25 mg Oral BID   multivitamin with minerals  1 tablet Oral Daily   pantoprazole (PROTONIX) IV  40 mg Intravenous QHS   potassium citrate  40 mEq Oral Once   [START ON 06/23/2021] thiamine injection  100 mg Intravenous Daily   Continuous Infusions:  sodium chloride     ceFEPime (MAXIPIME) IV 2 g (  06/22/21 0527)   norepinephrine (LEVOPHED) Adult infusion Stopped (06/19/21 0948)   thiamine injection 500 mg (06/22/21 0158)     LOS: 4 days    Time spent: 32 minutes    Sharen Hones, MD [facility]   To contact the attending provider between 7A-7P or the covering provider during after hours 7P-7A, please log into the web site [email or web address] and access using universal [place] password for that web site. If you do not have the password, please call the hospital operator.  06/22/2021, 11:43 AM

## 2021-06-22 NOTE — Progress Notes (Signed)
PT Cancellation Note  Patient Details Name: Damon English MRN: [id number] DOB: 1951-01-18   Cancelled Treatment:    Reason Eval/Treat Not Completed: Other (comment) Pt very pleasantly refused PT session this afternoon despite multiple attempts to motivate him to participate.  He states he has had a very busy day and simply doesn't feel like he wants to do anything right now.  "I only just had a large bowel movement and got back in to this bed."  He was eager to talk about medical history and how he feels he has had good care while here but repeatedly indicates no desire to work with PT or do any activity right now.    Kreg Shropshire, DPT 06/22/2021, 5:31 PM

## 2021-06-22 NOTE — Consult Note (Signed)
PHARMACY CONSULT NOTE - FOLLOW UP  Pharmacy Consult for Electrolyte Monitoring and Replacement   Recent Labs: Potassium (mmol/L)  Date Value  06/22/2021 3.5  11/08/2013 3.4 (L)   Magnesium (mg/dL)  Date Value  06/22/2021 2.0   Calcium (mg/dL)  Date Value  06/22/2021 8.6 (L)   Calcium, Total (mg/dL)  Date Value  11/08/2013 9.3   Albumin (g/dL)  Date Value  06/22/2021 2.8 (L)   Phosphorus (mg/dL)  Date Value  06/22/2021 2.9   Sodium (mmol/L)  Date Value  06/22/2021 137  11/08/2013 136     Assessment: 70 yo male presented to [facility] ER on 09/2 following a cardiac arrest.  Per ER notes pts wife reported while the pt was leaving his outpatient doctors office he felt like he was going to pass out.  He sat on his walker and lost consciousness.  Pts wife reported he briefly regained consciousness several times EMS notified.  When EMS arrived pt unresponsive and had a pulse, but required defibrillation x2. On a ventilator.     Goal of Therapy:  Mag ~2.0 K~ 4.0 WNL  Plan:  Renal function back to baseline with AM creatinine, Lytes WNL today. No repletion currently warranted. Scr 1.48>1.34>1.15 > 0.94 K 3.5>3.5; Mag 1.9>2; Phos 2.8>2.9   Follow up with AM labs.   Lorna Dibble ,PharmD, [facility] Clinical Pharmacist 06/22/2021 9:28 AM

## 2021-06-23 ENCOUNTER — Inpatient Hospital Stay: Payer: BC Managed Care – PPO

## 2021-06-23 ENCOUNTER — Encounter: Payer: Self-pay | Admitting: Internal Medicine

## 2021-06-23 LAB — BASIC METABOLIC PANEL
Anion gap: 6 (ref 5–15)
BUN: 15 mg/dL (ref 8–23)
CO2: 21 mmol/L — ABNORMAL LOW (ref 22–32)
Calcium: 8.5 mg/dL — ABNORMAL LOW (ref 8.9–10.3)
Chloride: 109 mmol/L (ref 98–111)
Creatinine, Ser: 0.91 mg/dL (ref 0.61–1.24)
GFR, Estimated: >60 mL/min (ref >60)
Glucose, Bld: 101 mg/dL — ABNORMAL HIGH (ref 70–99)
Potassium: 3.4 mmol/L — ABNORMAL LOW (ref 3.5–5.1)
Sodium: 136 mmol/L (ref 135–145)

## 2021-06-23 LAB — CBC
HCT: 32.4 % — ABNORMAL LOW (ref 39.0–52.0)
Hemoglobin: 10.8 g/dL — ABNORMAL LOW (ref 13.0–17.0)
MCH: 31.8 pg (ref 26.0–34.0)
MCHC: 33.3 g/dL (ref 30.0–36.0)
MCV: 95.3 fL (ref 80.0–100.0)
Platelets: 163 10*3/uL (ref 150–400)
RBC: 3.4 MIL/uL — ABNORMAL LOW (ref 4.22–5.81)
RDW: 17.4 % — ABNORMAL HIGH (ref 11.5–15.5)
WBC: 6 10*3/uL (ref 4.0–10.5)
nRBC: 0 % (ref 0.0–0.2)

## 2021-06-23 LAB — HEPATIC FUNCTION PANEL
ALT: 19 U/L (ref 0–44)
AST: 21 U/L (ref 15–41)
Albumin: 2.7 g/dL — ABNORMAL LOW (ref 3.5–5.0)
Alkaline Phosphatase: 44 U/L (ref 38–126)
Bilirubin, Direct: 0.3 mg/dL — ABNORMAL HIGH (ref 0.0–0.2)
Indirect Bilirubin: 0.6 mg/dL (ref 0.3–0.9)
Total Bilirubin: 0.9 mg/dL (ref 0.3–1.2)
Total Protein: 6.1 g/dL — ABNORMAL LOW (ref 6.5–8.1)

## 2021-06-23 LAB — CULTURE, BLOOD (ROUTINE X 2)
Culture: NO GROWTH
Culture: NO GROWTH
Special Requests: ADEQUATE
Special Requests: ADEQUATE

## 2021-06-23 LAB — PHOSPHORUS: Phosphorus: 3.1 mg/dL (ref 2.5–4.6)

## 2021-06-23 LAB — MAGNESIUM: Magnesium: 1.9 mg/dL (ref 1.7–2.4)

## 2021-06-23 LAB — GLUCOSE, CAPILLARY
Glucose-Capillary: 104 mg/dL — ABNORMAL HIGH (ref 70–99)
Glucose-Capillary: 127 mg/dL — ABNORMAL HIGH (ref 70–99)
Glucose-Capillary: 114 mg/dL — ABNORMAL HIGH (ref 70–99)
Glucose-Capillary: 127 mg/dL — ABNORMAL HIGH (ref 70–99)

## 2021-06-23 MED ORDER — SODIUM CHLORIDE 0.9 % WEIGHT BASED INFUSION: 3.0000 mL/kg/h | INTRAVENOUS | Status: DC

## 2021-06-23 MED ORDER — TECHNETIUM TC 99M TETROFOSMIN IV KIT
30.0000 | PACK | Freq: Once | INTRAVENOUS | Status: AC
  Administered 2021-06-23: 30.78 via INTRAVENOUS

## 2021-06-23 MED ORDER — SODIUM CHLORIDE 0.9% FLUSH
3.0000 mL | Freq: Two times a day (BID) | INTRAVENOUS | Status: DC
  Administered 2021-06-23 – 2021-06-25 (×5): 3 mL via INTRAVENOUS

## 2021-06-23 MED ORDER — TECHNETIUM TC 99M TETROFOSMIN IV KIT
10.0000 | PACK | Freq: Once | INTRAVENOUS | Status: AC | PRN
  Administered 2021-06-23: 10.02 via INTRAVENOUS

## 2021-06-23 MED ORDER — SODIUM CHLORIDE 0.9% FLUSH: 3.0000 mL | INTRAVENOUS | Status: DC | PRN

## 2021-06-23 MED ORDER — REGADENOSON 0.4 MG/5ML IV SOLN
0.4000 mg | Freq: Once | INTRAVENOUS | Status: AC
  Administered 2021-06-23: 0.4 mg via INTRAVENOUS
  Filled 2021-06-23: qty 5

## 2021-06-23 MED ORDER — LOSARTAN POTASSIUM 50 MG PO TABS
50.0000 mg | ORAL_TABLET | Freq: Every day | ORAL | Status: DC
  Administered 2021-06-23 – 2021-06-25 (×3): 50 mg via ORAL
  Filled 2021-06-23 (×3): qty 1

## 2021-06-23 MED ORDER — SODIUM CHLORIDE 0.9 % IV SOLN: 250.0000 mL | INTRAVENOUS | Status: DC | PRN

## 2021-06-23 MED ORDER — SODIUM CHLORIDE 0.9 % WEIGHT BASED INFUSION: 1.0000 mL/kg/h | INTRAVENOUS | Status: DC

## 2021-06-23 MED ORDER — NITROGLYCERIN 0.4 MG SL SUBL
0.4000 mg | SUBLINGUAL_TABLET | SUBLINGUAL | Status: DC | PRN
  Administered 2021-06-24: 0.4 mg via SUBLINGUAL
  Filled 2021-06-23 (×2): qty 1

## 2021-06-23 MED ORDER — ASPIRIN 81 MG PO CHEW: 81.0000 mg | CHEWABLE_TABLET | ORAL | Status: AC

## 2021-06-23 MED ORDER — POTASSIUM CHLORIDE CRYS ER 20 MEQ PO TBCR
40.0000 meq | EXTENDED_RELEASE_TABLET | ORAL | Status: AC
  Administered 2021-06-23 (×2): 40 meq via ORAL
  Filled 2021-06-23 (×2): qty 4

## 2021-06-23 NOTE — Consult Note (Signed)
PHARMACY CONSULT NOTE - FOLLOW UP  Pharmacy Consult for Electrolyte Monitoring and Replacement   Recent Labs: Potassium (mmol/L)  Date Value  06/23/2021 3.4 (L)  11/08/2013 3.4 (L)   Magnesium (mg/dL)  Date Value  06/23/2021 1.9   Calcium (mg/dL)  Date Value  06/23/2021 8.5 (L)   Calcium, Total (mg/dL)  Date Value  11/08/2013 9.3   Albumin (g/dL)  Date Value  06/23/2021 2.7 (L)   Phosphorus (mg/dL)  Date Value  06/23/2021 3.1   Sodium (mmol/L)  Date Value  06/23/2021 136  11/08/2013 136     Assessment: 70 yo male presented to [facility] ER on 09/2 following a cardiac arrest.  Per ER notes pts wife reported while the pt was leaving his outpatient doctors office he felt like he was going to pass out.  He sat on his walker and lost consciousness.  Pts wife reported he briefly regained consciousness several times EMS notified.  When EMS arrived pt unresponsive and had a pulse, but required defibrillation x2. On a ventilator.   Pt received 55mq Kcl PO x1 on 0906 and K decreased from 3.5>3.4. Will increase amount given today.   Goal of Therapy:  Mag ~2.0 K~ 4.0 WNL  Plan:  -Will give Kcl 461m PO x2 -No other electrolyte repletion is necessary at this time -Follow up with AM labs  BrNarda RutherfordPharmD Pharmacy Resident  06/23/2021 1:05 PM

## 2021-06-23 NOTE — Care Management Important Message (Signed)
Important Message  Patient Details  Name: KORDAI SKEMP MRN: [id number] Date of Birth: [birth year]   Medicare Important Message Given:  Yes     Dannette Barbara 06/23/2021, 11:10 AM

## 2021-06-23 NOTE — Progress Notes (Signed)
82 PROGRESS NOTE    Damon English  [id number] DOB: 09/03/1951 DOA: 06/18/2021 PCP: [name], PA-C    Brief Narrative:  Patient is a 70 year old male with history of alcohol abuse, chronic systolic congestive heart failure, coronary artery disease, type 2 diabetes, who presents to the hospital after cardiac arrest.  He received defibrillation by EMS prior to arrival in the hospital.  She was intubated after arriving the emergency room. Patient also had a potassium of 2.2, which is presumed to be the cardiac arrest.  CT angiogram of the chest did not show PE. Echocardiogram showed ejection fraction 50%.  No regional abnormality.  Assessment & Plan:   Active Problems:   NSTEMI (non-ST elevated myocardial infarction) (HCC)   Chronic systolic CHF (congestive heart failure) (HCC)   Ischemic cardiomyopathy   Type 2 diabetes mellitus (HCC)   Cardiac arrest (HCC)   ETOH abuse   Acute respiratory failure with hypoxia and hypercapnia (HCC)   On mechanically assisted ventilation (HCC)   Cellulitis of leg, right   Sepsis ([place])   Acute kidney injury (AKI) with acute tubular necrosis (ATN) (HCC)   Acute encephalopathy   Hypokalemia   Transaminitis   Pressure injury of skin  #1.  Cardiac arrest with ventricular fibrillation secondary to severe hypokalemia. Elevated troponin secondary to cardiac arrest. Complete AV block, resolved. Chronic systolic congestive heart failure.  Orthostatic hypotension. Coronary artery disease status post CABG. Patient condition is gradually improving, he slept well last night.  He does not seem to have any confusion today. Stress nuclear test was performed today, pending results.  Further treatment will be decided by the results of the stress test. Patient is also followed by physical therapy, will decide if patient need to go to rehab versus going home.  2.  Acute kidney injury secondary to ATN. Hypokalemia. Potassium will be repleted again for  potassium 3.4.  Recheck a level tomorrow.   3.  Sepsis is secondary to right lower extremity cellulitis. Right lower extremity cellulitis. Recent tick bite. Condition improved, will complete 2 weeks of doxycycline.  4.  Colopathy. Alcohol use disorder. Stable.  5.  Type 2 diabetes. No change in treatment plan.  6.  History of DVT PE Continue Eliquis.  7.  Liver cirrhosis.    DVT prophylaxis: Eliquis Code Status: full Family Communication: Wife updated. Disposition Plan:    Status is: Inpatient  Remains inpatient appropriate because:Inpatient level of care appropriate due to severity of illness  Dispo: The patient is from: Home              Anticipated d/c is to: Pending              Patient currently is not medically stable to d/c.   Difficult to place patient No        I/O last 3 completed shifts: In: 66 [P.O.:290] Out: 900 [Urine:900] Total I/O In: -  Out: 32 [Urine:550]     Consultants:  Card  Procedures: none  Antimicrobials: Doxycycline  Subjective: Patient doing well today, he does not seem to have any confusion today.  He has good appetite without nausea vomiting.  He slept very well last night. No fever chills pain Denies any short of breath or cough. No abdominal pain nausea vomiting. No dysuria or hematuria.  Objective: Vitals:   06/23/21 0422 06/23/21 0500 06/23/21 0757 06/23/21 1208  BP: (!) 162/75  (!) 168/81 (!) 166/69  Pulse: 79  76 80  Resp: 18  18 16  Temp: 98.3 F (36.8 C)  98.7 F (37.1 C) 98.4 F (36.9 C)  TempSrc: Oral   Oral  SpO2: 98%  100% 98%  Weight: 103.2 kg 103.2 kg    Height:        Intake/Output Summary (Last 24 hours) at 06/23/2021 1327 Last data filed at 06/23/2021 1326 Gross per 24 hour  Intake 240 ml  Output 550 ml  Net -310 ml   Filed Weights   06/22/21 0623 06/23/21 0422 06/23/21 0500  Weight: 104.7 kg 103.2 kg 103.2 kg    Examination:  General exam: Appears calm and comfortable   Respiratory system: Clear to auscultation. Respiratory effort normal. Cardiovascular system: S1 & S2 heard, RRR. No JVD, murmurs, rubs, gallops or clicks. No pedal edema. Gastrointestinal system: Abdomen is nondistended, soft and nontender. No organomegaly or masses felt. Normal bowel sounds heard. Central nervous system: Alert and oriented. No focal neurological deficits. Extremities: Symmetric 5 x 5 power. Skin: No rashes, lesions or ulcers Psychiatry: Judgement and insight appear normal. Mood & affect appropriate.     Data Reviewed: I have personally reviewed following labs and imaging studies  CBC: Recent Labs  Lab 06/18/21 1513 06/18/21 2100 06/19/21 0630 06/20/21 0225 06/21/21 0431 06/22/21 0351 06/23/21 0240  WBC 12.0*   < > 10.9* 7.9 7.0 6.2 6.0  NEUTROABS 8.5*  --   --   --   --   --   --   HGB 14.5   < > 12.3* 11.3* 10.4* 11.1* 10.8*  HCT 43.4   < > 34.6* 33.8* 30.9* 34.2* 32.4*  MCV 91.9   < > 92.0 96.6 95.7 95.0 95.3  PLT 201   < > 156 136* 149* 162 163   < > = values in this interval not displayed.   Basic Metabolic Panel: Recent Labs  Lab 06/19/21 0630 06/19/21 1657 06/20/21 0225 06/20/21 1625 06/21/21 0431 06/22/21 0351 06/23/21 0240  NA 139  --  136  --  137 137 136  K 2.8* 2.9* 3.0* 3.4* 3.5 3.5 3.4*  CL 101  --  103  --  108 108 109  CO2 28  --  26  --  22 22 21*  GLUCOSE 147*  --  108*  --  120* 115* 101*  BUN 21  --  17  --  '15 16 15  '$ CREATININE 1.48*  --  1.34*  --  1.15 0.94 0.91  CALCIUM 8.5*  --  8.0*  --  8.2* 8.6* 8.5*  MG 1.7 2.1 2.0  --  1.9 2.0 1.9  PHOS 3.3  --  2.4*  --  2.8 2.9 3.1   GFR: Estimated Creatinine Clearance: 85.9 mL/min (by C-G formula based on SCr of 0.91 mg/dL). Liver Function Tests: Recent Labs  Lab 06/19/21 0630 06/20/21 0225 06/21/21 0431 06/22/21 0351 06/23/21 0240  AST 41 '30 19 18 21  '$ ALT 42 34 '23 20 19  '$ ALKPHOS 46 48 45 45 44  BILITOT 0.8 1.0 1.0 0.9 0.9  PROT 5.7* 6.2* 5.9* 6.5 6.1*  ALBUMIN 2.4*  2.8* 2.6* 2.8* 2.7*   No results for input(s): LIPASE, AMYLASE in the last 168 hours. No results for input(s): AMMONIA in the last 168 hours. Coagulation Profile: Recent Labs  Lab 06/18/21 1731  INR 1.6*   Cardiac Enzymes: No results for input(s): CKTOTAL, CKMB, CKMBINDEX, TROPONINI in the last 168 hours. BNP (last 3 results) No results for input(s): PROBNP in the last 8760 hours. HbA1C: No results for  input(s): HGBA1C in the last 72 hours. CBG: Recent Labs  Lab 06/22/21 1200 06/22/21 1544 06/22/21 2052 06/23/21 0755 06/23/21 1234  GLUCAP 121* 108* 137* 104* 127*   Lipid Profile: No results for input(s): CHOL, HDL, LDLCALC, TRIG, CHOLHDL, LDLDIRECT in the last 72 hours. Thyroid Function Tests: No results for input(s): TSH, T4TOTAL, FREET4, T3FREE, THYROIDAB in the last 72 hours. Anemia Panel: No results for input(s): VITAMINB12, FOLATE, FERRITIN, TIBC, IRON, RETICCTPCT in the last 72 hours. Sepsis Labs: Recent Labs  Lab 06/18/21 1731 06/18/21 2000 06/18/21 2100 06/19/21 0630 06/20/21 0225  PROCALCITON  --   --  0.31 0.38 0.25  LATICACIDVEN 2.6* 2.1*  --  1.3  --     Recent Results (from the past 240 hour(s))  Resp Panel by RT-PCR (Flu A&B, Covid) Nasopharyngeal Swab     Status: None   Collection Time: 06/18/21  3:12 PM   Specimen: Nasopharyngeal Swab; Nasopharyngeal(NP) swabs in vial transport medium  Result Value Ref Range Status   SARS Coronavirus 2 by RT PCR NEGATIVE NEGATIVE Final    Comment: (NOTE) SARS-CoV-2 target nucleic acids are NOT DETECTED.  The SARS-CoV-2 RNA is generally detectable in upper respiratory specimens during the acute phase of infection. The lowest concentration of SARS-CoV-2 viral copies this assay can detect is 138 copies/mL. A negative result does not preclude SARS-Cov-2 infection and should not be used as the sole basis for treatment or other patient management decisions. A negative result may occur with  improper specimen  collection/handling, submission of specimen other than nasopharyngeal swab, presence of viral mutation(s) within the areas targeted by this assay, and inadequate number of viral copies(<138 copies/mL). A negative result must be combined with clinical observations, patient history, and epidemiological information. The expected result is Negative.  Fact Sheet for Patients:  EntrepreneurPulse.com.au  Fact Sheet for Healthcare Providers:  IncredibleEmployment.be  This test is no t yet approved or cleared by the [place] FDA and  has been authorized for detection and/or diagnosis of SARS-CoV-2 by FDA under an Emergency Use Authorization (EUA). This EUA will remain  in effect (meaning this test can be used) for the duration of the COVID-19 declaration under Section 564(b)(1) of the Act, 21 U.S.C.section 360bbb-3(b)(1), unless the authorization is terminated  or revoked sooner.       Influenza A by PCR NEGATIVE NEGATIVE Final   Influenza B by PCR NEGATIVE NEGATIVE Final    Comment: (NOTE) The Xpert Xpress SARS-CoV-2/FLU/RSV plus assay is intended as an aid in the diagnosis of influenza from Nasopharyngeal swab specimens and should not be used as a sole basis for treatment. Nasal washings and aspirates are unacceptable for Xpert Xpress SARS-CoV-2/FLU/RSV testing.  Fact Sheet for Patients: EntrepreneurPulse.com.au  Fact Sheet for Healthcare Providers: IncredibleEmployment.be  This test is not yet approved or cleared by the [place] FDA and has been authorized for detection and/or diagnosis of SARS-CoV-2 by FDA under an Emergency Use Authorization (EUA). This EUA will remain in effect (meaning this test can be used) for the duration of the COVID-19 declaration under Section 564(b)(1) of the Act, 21 U.S.C. section 360bbb-3(b)(1), unless the authorization is terminated or revoked.  Performed at [facility], [address] [place], [place] [address]   Blood Culture (routine x 2)     Status: None   Collection Time: 06/18/21  5:31 PM   Specimen: BLOOD  Result Value Ref Range Status   Specimen Description BLOOD RIGHT HAND  Final   Special Requests  Final    BOTTLES DRAWN AEROBIC AND ANAEROBIC Blood Culture adequate volume   Culture   Final    NO GROWTH 5 DAYS Performed at [facility], [address] [place], [place] [address]    Report Status 06/23/2021 FINAL  Final  Blood Culture (routine x 2)     Status: None   Collection Time: 06/18/21  5:31 PM   Specimen: BLOOD  Result Value Ref Range Status   Specimen Description BLOOD RIGHT [facility]  Final   Special Requests   Final    BOTTLES DRAWN AEROBIC AND ANAEROBIC Blood Culture adequate volume   Culture   Final    NO GROWTH 5 DAYS Performed at [facility], [address] [place], [place] [address]    Report Status 06/23/2021 FINAL  Final  Urine Culture     Status: None   Collection Time: 06/18/21  5:31 PM   Specimen: In/Out Cath Urine  Result Value Ref Range Status   Specimen Description   Final    IN/OUT CATH URINE Performed at [facility], [address] [place], [place] [address]    Special Requests   Final    NONE Performed at [facility], [address] [place], [place] [address]    Culture   Final    NO GROWTH Performed at [facility], [place] [address], [place], [place] [address]    Report Status 06/20/2021 FINAL  Final         Radiology Studies: DG Chest Port 1 View  Result Date: 06/21/2021 CLINICAL DATA:  Cough. EXAM: PORTABLE CHEST 1 VIEW COMPARISON:  Heart is enlarged. Atherosclerotic changes are present at the aortic arch. Lung volumes are low. No edema or effusion is present. No focal airspace disease is present. FINDINGS: Heart is enlarged. Atherosclerotic changes are present at the aortic arch. Lung volumes are low. No edema or effusion is present.  No focal airspace disease is present. IMPRESSION: 1. Cardiomegaly without failure. 2. Low lung volumes. Electronically Signed   By: San Morelle M.D.   On: 06/21/2021 16:09        Scheduled Meds:  acidophilus  2 capsule Oral TID   apixaban  5 mg Oral BID   brimonidine  1 drop Both Eyes BID   Chlorhexidine Gluconate Cloth  6 each Topical Daily   clopidogrel  75 mg Oral Daily   docusate sodium  100 mg Oral BID   doxycycline  100 mg Oral [id number]   folic acid  1 mg Oral Daily   guaiFENesin  600 mg Oral BID   insulin aspart  0-15 Units Subcutaneous TID AC & HS   isosorbide mononitrate  30 mg Oral Daily   latanoprost  1 drop Both Eyes QHS   levalbuterol  0.63 mg Nebulization Once   losartan  50 mg Oral Daily   metoprolol succinate  50 mg Oral BID   multivitamin with minerals  1 tablet Oral Daily   pantoprazole (PROTONIX) IV  40 mg Intravenous QHS   potassium chloride  40 mEq Oral Q4H   thiamine injection  100 mg Intravenous Daily   Continuous Infusions:  sodium chloride       LOS: 5 days    Time spent: 27 minutes    Sharen Hones, MD [facility]   To contact the attending provider between 7A-7P or the covering provider during after hours 7P-7A, please log into the web site [email or web address] and access using universal [place] password  for that web site. If you do not have the password, please call the hospital operator.  06/23/2021, 1:27 PM

## 2021-06-23 NOTE — TOC Progression Note (Signed)
Transition of Care [facility]) - Progression Note    Patient Details  Name: Damon English MRN: [id number] Date of Birth: Jul 24, 1951  [facility]) CM/SW Contact  Eileen Stanford, LCSW Phone Number: 06/23/2021, 3:12 PM  Clinical Narrative:  Set up with [facility] for dc.     Expected Discharge Plan: [place] Barriers to Discharge: Continued Medical Work up  Expected Discharge Plan and Services Expected Discharge Plan: [place] arrangements for the past 2 months: Single Family Home                           HH Arranged: PT, RN [facility] Agency: [place] ([facility]) Date HH Agency Contacted: 06/22/21 Time [place]: 1526 Representative spoke with at [place]: [place] ([place]) Interventions    Readmission Risk Interventions No flowsheet data found.

## 2021-06-23 NOTE — Progress Notes (Signed)
[facility] Encounter Note  Patient: Damon English / Admit Date: 06/18/2021 / Date of Encounter: 06/23/2021, 2:31 PM   Subjective: 9/5 patient is done very well overnight with no evidence of significant chest pain shortness of breath or other significant symptoms.  Prior to this event the patient had no evidence of worsening angina, in fact was improving with his medical management and anginal symptoms.  Cardiac arrest was in the setting potassium of 2.2 and possible sepsis.  Troponin peak was 1533 possibly consistent with CPR.  Echocardiogram showing low normal LV systolic function ejection fraction of 50%.  Patient has tolerated appropriate medication management with no evidence of heart failure or angina since admission  9/6 no changes from yesterday with no evidence of rhythm disturbances or other significant chest discomfort or concerns.  Patient does complain of shortness of breath with physical activity multifactorial in nature and unchanged from before.  Patient had caffeine use therefore cannot perform stress test to evaluate and/or risk stratify for discharged home  9/7.  Patient has done overall well overnight with no evidence of chest discomfort or evidence of rhythm disturbances.  Telemetry has shown no evidence of any rhythm disturbances that could have caused any syncope.  The patient was admitted for an apparent cardiac arrest although had severe hypokalemia at potassium of 2.2.  Peak troponin was 1545 consistent with either demand ischemia non-ST elevation myocardial infarction and or CPR with or without defibrillation.  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  The patient also had occlusion of saphenous vein graft to obtuse marginal which has been medically managed but no evidence of ischemia in  that distribution at this time.  Therefore, will discuss with interventional team to assess for the possibility of cardiac catheterization to assess need for further intervention of LIMA to LAD anastomosis Review of Systems: Positive for: None Negative for: Vision change, hearing change, syncope, dizziness, nausea, vomiting,diarrhea, bloody stool, stomach pain, cough, congestion, diaphoresis, urinary frequency, urinary pain,skin lesions, skin rashes Others previously listed  Objective: Telemetry: Normal sinus rhythm Physical Exam: Blood pressure (!) 166/69, pulse 80, temperature 98.4 F (36.9 C), temperature source Oral, resp. rate 16, height '5\' 5"'$  (1.651 m), weight 103.2 kg, SpO2 98 %. Body mass index is 37.87 kg/m. General: Well developed, well nourished, in no acute distress. Head: Normocephalic, atraumatic, sclera non-icteric, no xanthomas, nares are without discharge. Neck: No apparent masses Lungs: Normal respirations with no wheezes, no rhonchi, no rales , no crackles   Heart: Regular rate and rhythm, normal S1 S2, no murmur, no rub, no gallop, PMI is normal size and placement, carotid upstroke normal without bruit, jugular venous pressure normal Abdomen: Soft, non-tender, non-distended with normoactive bowel sounds. No hepatosplenomegaly. Abdominal aorta is normal size without bruit Extremities: No edema, no clubbing, no cyanosis, no ulcers,  Peripheral: 2+ radial, 2+ femoral, 2+ dorsal pedal pulses Neuro: Alert and oriented. Moves all extremities spontaneously. Psych:  Responds to questions appropriately with a normal affect.   Intake/Output Summary (Last 24 hours) at 06/23/2021 1431 Last data filed at 06/23/2021 1326 Gross per 24 hour  Intake --  Output 550 ml  Net -550 ml     Inpatient Medications:   acidophilus  2 capsule Oral TID   apixaban  5 mg Oral BID   brimonidine  1 drop Both Eyes BID  Chlorhexidine Gluconate Cloth  6 each Topical Daily   clopidogrel  75 mg Oral  Daily   docusate sodium  100 mg Oral BID   doxycycline  100 mg Oral [id number]   folic acid  1 mg Oral Daily   guaiFENesin  600 mg Oral BID   insulin aspart  0-15 Units Subcutaneous TID AC & HS   isosorbide mononitrate  30 mg Oral Daily   latanoprost  1 drop Both Eyes QHS   levalbuterol  0.63 mg Nebulization Once   losartan  50 mg Oral Daily   metoprolol succinate  50 mg Oral BID   multivitamin with minerals  1 tablet Oral Daily   pantoprazole (PROTONIX) IV  40 mg Intravenous QHS   potassium chloride  40 mEq Oral Q4H   thiamine injection  100 mg Intravenous Daily   Infusions:   sodium chloride      Labs: Recent Labs    06/22/21 0351 06/23/21 0240  NA 137 136  K 3.5 3.4*  CL 108 109  CO2 22 21*  GLUCOSE 115* 101*  BUN 16 15  CREATININE 0.94 0.91  CALCIUM 8.6* 8.5*  MG 2.0 1.9  PHOS 2.9 3.1    Recent Labs    06/22/21 0351 06/23/21 0240  AST 18 21  ALT 20 19  ALKPHOS 45 44  BILITOT 0.9 0.9  PROT 6.5 6.1*  ALBUMIN 2.8* 2.7*    Recent Labs    06/22/21 0351 06/23/21 0240  WBC 6.2 6.0  HGB 11.1* 10.8*  HCT 34.2* 32.4*  MCV 95.0 95.3  PLT 162 163    No results for input(s): CKTOTAL, CKMB, TROPONINI in the last 72 hours. Invalid input(s): POCBNP No results for input(s): HGBA1C in the last 72 hours.    Weights: Filed Weights   06/22/21 0623 06/23/21 0422 06/23/21 0500  Weight: 104.7 kg 103.2 kg 103.2 kg     Radiology/Studies:  DG Abdomen 1 View  Result Date: 06/18/2021 CLINICAL DATA:  Orogastric tube placement EXAM: ABDOMEN - 1 VIEW COMPARISON:  6:26 p.m. FINDINGS: Orogastric tube tip now overlies the gastric fundus. Visualized lung bases are clear. No free intraperitoneal gas. The abdominal gas pattern is indeterminate due to a paucity of intra-abdominal gas. Pelvis excluded from view. IMPRESSION: Orogastric tube tip within the gastric fundus. Electronically Signed   By: Fidela Salisbury M.D.   On: 06/18/2021 21:15   DG Abd 1 View  Result Date:  06/18/2021 CLINICAL DATA:  Encounter for NG tube placement EXAM: ABDOMEN - 1 VIEW COMPARISON:  None. FINDINGS: Defibrillator pads overlie the upper abdomen. There is no evidence of bowel obstruction. There is no visible NG tube. The NG tube appears to be overlying the midesophagus on separately dictated chest radiograph. Prior L3-L5 fusion. No acute osseous abnormality. Bilateral iliac stents IMPRESSION: No visible nasogastric tube overlying the abdomen. See separately dictated chest radiograph. Electronically Signed   By: Maurine Simmering M.D.   On: 06/18/2021 18:53   CT HEAD WO CONTRAST (5MM)  Result Date: 06/18/2021 CLINICAL DATA:  Mental status change.  Patient intubated EXAM: CT HEAD WITHOUT CONTRAST TECHNIQUE: Contiguous axial images were obtained from the base of the skull through the vertex without intravenous contrast. COMPARISON:  None. FINDINGS: Brain: Hypodensity left parietal lobe involving cortex and white matter compatible with chronic infarct. Patchy white matter hypodensity bilaterally likely chronic. Negative for hydrocephalus. Negative for acute infarct, hemorrhage or mass. 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 Electronically Signed   By: Franchot Gallo M.D.   On: 06/18/2021 16:20   CT Angio Chest PE W and/or Wo Contrast  Addendum Date: 06/19/2021   ADDENDUM REPORT: 06/19/2021 10:11 ADDENDUM: Upon further review, there is a mildly displaced acute fracture of the right 6th rib anteriorly (image 64/6). In addition, there are possible nondisplaced fractures the right anterior 4th, 5th and 7th ribs. There is also a probable fracture of the left 6th rib anteriorly. No pneumothorax or pleural effusion. These results were discussed by telephone at the time of interpretation on 06/19/2021 at 09:55 am with provider Derrill Kay, who verbally acknowledged these results. Electronically Signed   By: Richardean Sale M.D.   On: 06/19/2021 10:11   Result Date: 06/19/2021 CLINICAL DATA:  PE suspected, high probability.  Intubated patient. EXAM: CT ANGIOGRAPHY CHEST WITH CONTRAST TECHNIQUE: Multidetector CT imaging of the chest was performed using the standard protocol during bolus administration of intravenous contrast. Multiplanar CT image reconstructions and MIPs were obtained to evaluate the vascular anatomy. CONTRAST:  74m OMNIPAQUE IOHEXOL 350 MG/ML SOLN COMPARISON:  Radiographs 01/28/2019. FINDINGS: Cardiovascular: The pulmonary arteries are well opacified with contrast to the level of the subsegmental branches. There is no evidence of acute pulmonary embolism. There is essentially no contrast opacification of the systemic arterial system. Patient is status post median sternotomy and CABG. There is diffuse atherosclerosis of the aorta, great vessels and coronary arteries. There is some reflux of contrast into the IVC and hepatic veins. The heart size is normal. There is no pericardial effusion. Mediastinum/Nodes: Tip of the endotracheal tube is in the mid trachea. There are no enlarged mediastinal, hilar or axillary lymph nodes. The thyroid gland, trachea and esophagus demonstrate no significant findings. Lungs/Pleura: There is no pleural effusion. Mild centrilobular emphysema with dependent pulmonary opacities bilaterally, favoring atelectasis. No consolidation or suspicious pulmonary nodularity. Triangular-shaped 4 mm right middle lobe nodule on image 47/6, likely benign. Upper abdomen: As above, there is some reflux of contrast into the IVC and hepatic veins. The liver contours are mildly irregular. The gallbladder is surgically absent. Musculoskeletal/Chest wall: There is no chest wall mass or suspicious osseous finding. Previous median sternotomy. Multilevel thoracic spondylosis. Review of the MIP images confirms the above  findings. IMPRESSION: 1. No evidence of acute pulmonary embolism or other acute chest process. 2. Diffuse coronary and Aortic Atherosclerosis (ICD10-I70.0). Previous CABG. 3. Mild emphysema with probable mild dependent atelectasis in both lungs. 4. Potential changes of early cirrhosis. Electronically Signed: By: WRichardean SaleM.D. On: 06/18/2021 16:20   CT FEMUR RIGHT WO CONTRAST  Result Date: 06/18/2021 CLINICAL DATA:  Recent insect bite with fever and right lower extremity pain and edema. EXAM: CT OF THE RIGHT FEMUR, LOWER LEG AND FOOT WITHOUT CONTRAST TECHNIQUE: Multidetector CT imaging of the right lower extremity was performed according to the standard protocol. The images extend from the lower right pelvis through the right foot. Multiplanar CT image reconstructions were also generated. COMPARISON:  None. FINDINGS: Bones/Joint/Cartilage No evidence of acute fracture or dislocation. Status post proximal right femoral dynamic plate and screw fixation of an intertrochanteric femur fracture. The hardware is intact without loosening. There is irregular periosteal ossification anteriorly, likely chronic and related to the original injury. The distal femur, tibia, fibula and foot demonstrate no significant findings. No large joint effusions are seen. Ligaments Suboptimally assessed by CT. Muscles and Tendons No focal muscular atrophy  or fluid collection identified. The extensor mechanism is intact at the knee. The ankle tendons appear intact and normally located. Soft tissues Bilateral external iliac stents are noted with postsurgical changes from right femoropopliteal bypass grafting. There is extensive atherosclerosis of the underlying native arteries in the right lower extremity. Foley catheter is in place with mild bladder wall thickening. There is contrast in the bladder related to the preceding chest CTA. Postsurgical changes are present in the subcutaneous fat lateral to the proximal right femur,  attributed to the previous ORIF. No inflammatory changes or fluid collections are seen within the thigh. There is diffuse subcutaneous edema within the lower leg, greatest distally. No focal fluid collection, soft tissue emphysema or foreign body identified. This edema extends into the foot. IMPRESSION: 1. Diffuse subcutaneous edema within the lower leg and foot consistent with soft tissue infection (cellulitis). No focal fluid collection or unexpected foreign body identified. 2. No evidence of deep soft tissue infection, septic arthritis or osteomyelitis. 3. Previous right proximal femoral ORIF without acute osseous findings. 4. Extensive atherosclerosis post right femoropopliteal bypass grafting. Electronically Signed   By: Richardean Sale M.D.   On: 06/18/2021 20:52   CT TIBIA FIBULA RIGHT WO CONTRAST  Result Date: 06/18/2021 CLINICAL DATA:  Recent insect bite with fever and right lower extremity pain and edema. EXAM: CT OF THE RIGHT FEMUR, LOWER LEG AND FOOT WITHOUT CONTRAST TECHNIQUE: Multidetector CT imaging of the right lower extremity was performed according to the standard protocol. The images extend from the lower right pelvis through the right foot. Multiplanar CT image reconstructions were also generated. COMPARISON:  None. FINDINGS: Bones/Joint/Cartilage No evidence of acute fracture or dislocation. Status post proximal right femoral dynamic plate and screw fixation of an intertrochanteric femur fracture. The hardware is intact without loosening. There is irregular periosteal ossification anteriorly, likely chronic and related to the original injury. The distal femur, tibia, fibula and foot demonstrate no significant findings. No large joint effusions are seen. Ligaments Suboptimally assessed by CT. Muscles and Tendons No focal muscular atrophy or fluid collection identified. The extensor mechanism is intact at the knee. The ankle tendons appear intact and normally located. Soft tissues Bilateral  external iliac stents are noted with postsurgical changes from right femoropopliteal bypass grafting. There is extensive atherosclerosis of the underlying native arteries in the right lower extremity. Foley catheter is in place with mild bladder wall thickening. There is contrast in the bladder related to the preceding chest CTA. Postsurgical changes are present in the subcutaneous fat lateral to the proximal right femur, attributed to the previous ORIF. No inflammatory changes or fluid collections are seen within the thigh. There is diffuse subcutaneous edema within the lower leg, greatest distally. No focal fluid collection, soft tissue emphysema or foreign body identified. This edema extends into the foot. IMPRESSION: 1. Diffuse subcutaneous edema within the lower leg and foot consistent with soft tissue infection (cellulitis). No focal fluid collection or unexpected foreign body identified. 2. No evidence of deep soft tissue infection, septic arthritis or osteomyelitis. 3. Previous right proximal femoral ORIF without acute osseous findings. 4. Extensive atherosclerosis post right femoropopliteal bypass grafting. Electronically Signed   By: Richardean Sale M.D.   On: 06/18/2021 20:52   CT FOOT RIGHT WO CONTRAST  Result Date: 06/18/2021 CLINICAL DATA:  Recent insect bite with fever and right lower extremity pain and edema. EXAM: CT OF THE RIGHT FEMUR, LOWER LEG AND FOOT WITHOUT CONTRAST TECHNIQUE: Multidetector CT imaging of the right lower extremity was  performed according to the standard protocol. The images extend from the lower right pelvis through the right foot. Multiplanar CT image reconstructions were also generated. COMPARISON:  None. FINDINGS: Bones/Joint/Cartilage No evidence of acute fracture or dislocation. Status post proximal right femoral dynamic plate and screw fixation of an intertrochanteric femur fracture. The hardware is intact without loosening. There is irregular periosteal ossification  anteriorly, likely chronic and related to the original injury. The distal femur, tibia, fibula and foot demonstrate no significant findings. No large joint effusions are seen. Ligaments Suboptimally assessed by CT. Muscles and Tendons No focal muscular atrophy or fluid collection identified. The extensor mechanism is intact at the knee. The ankle tendons appear intact and normally located. Soft tissues Bilateral external iliac stents are noted with postsurgical changes from right femoropopliteal bypass grafting. There is extensive atherosclerosis of the underlying native arteries in the right lower extremity. Foley catheter is in place with mild bladder wall thickening. There is contrast in the bladder related to the preceding chest CTA. Postsurgical changes are present in the subcutaneous fat lateral to the proximal right femur, attributed to the previous ORIF. No inflammatory changes or fluid collections are seen within the thigh. There is diffuse subcutaneous edema within the lower leg, greatest distally. No focal fluid collection, soft tissue emphysema or foreign body identified. This edema extends into the foot. IMPRESSION: 1. Diffuse subcutaneous edema within the lower leg and foot consistent with soft tissue infection (cellulitis). No focal fluid collection or unexpected foreign body identified. 2. No evidence of deep soft tissue infection, septic arthritis or osteomyelitis. 3. Previous right proximal femoral ORIF without acute osseous findings. 4. Extensive atherosclerosis post right femoropopliteal bypass grafting. Electronically Signed   By: Richardean Sale M.D.   On: 06/18/2021 20:52   DG Chest Port 1 View  Result Date: 06/21/2021 CLINICAL DATA:  Cough. EXAM: PORTABLE CHEST 1 VIEW COMPARISON:  Heart is enlarged. Atherosclerotic changes are present at the aortic arch. Lung volumes are low. No edema or effusion is present. No focal airspace disease is present. FINDINGS: Heart is enlarged. Atherosclerotic  changes are present at the aortic arch. Lung volumes are low. No edema or effusion is present. No focal airspace disease is present. IMPRESSION: 1. Cardiomegaly without failure. 2. Low lung volumes. Electronically Signed   By: San Morelle M.D.   On: 06/21/2021 16:09   DG Chest Port 1 View  Result Date: 06/18/2021 CLINICAL DATA:  Encounter for ET tube and OG tube placement EXAM: PORTABLE CHEST 1 VIEW COMPARISON:  Same day CT chest FINDINGS: Endotracheal tube overlies the midthoracic trachea. There is an orogastric tube with tip overlying the mid esophagus. Fibular pads overlie the left lower chest. Enlarged cardiac silhouette. Bibasilar subsegmental atelectasis. There is no focal airspace consolidation. There is no large pleural effusion or visible pneumothorax. There is no acute osseous abnormality. IMPRESSION: Endotracheal tube tip overlies the midthoracic trachea. Orogastric tube tip overlies the mid esophagus, recommend advancement. Electronically Signed   By: Maurine Simmering M.D.   On: 06/18/2021 18:56   VAS [place] ABI WITH/WO TBI  Result Date: 06/22/2021  LOWER EXTREMITY DOPPLER STUDY Patient Name:  JOVANNI SHAWHAN  Date of Exam:   06/18/2021 Medical Rec #: [id number]         Accession #:    [id number] Date of Birth: [birth year]          Patient Gender: M Patient Age:   63 years Exam Location:  [facility] Procedure:  VAS [place] ABI WITH/WO TBI Referring Phys: JASON DEW --------------------------------------------------------------------------------  Indications: Peripheral artery disease.  Vascular Interventions: 01/14/2021 Bilateral CIA and EIA stents. Comparison Study: 01/2021 Performing Technologist: Concha Norway RVT  Examination Guidelines: A complete evaluation includes at minimum, Doppler waveform signals and systolic blood pressure reading at the level of bilateral brachial, anterior tibial, and posterior tibial arteries, when vessel segments are accessible. Bilateral testing is considered  an integral part of a complete examination. Photoelectric Plethysmograph (PPG) waveforms and toe systolic pressure readings are included as required and additional duplex testing as needed. Limited examinations for reoccurring indications may be performed as noted.  ABI Findings: +---------+------------------+-----+--------+--------+ Right    Rt Pressure (mmHg)IndexWaveformComment  +---------+------------------+-----+--------+--------+ Brachial 141                                     +---------+------------------+-----+--------+--------+ ATA      160                    biphasic1.13     +---------+------------------+-----+--------+--------+ PTA      181               1.28 biphasic         +---------+------------------+-----+--------+--------+ Ellen Henri               0.79 Normal           +---------+------------------+-----+--------+--------+ +---------+------------------+-----+----------+-------+ Left     Lt Pressure (mmHg)IndexWaveform  Comment +---------+------------------+-----+----------+-------+ ATA      112                    monophasic.79     +---------+------------------+-----+----------+-------+ PTA      107               0.76 monophasic        +---------+------------------+-----+----------+-------+ Great Toe53                0.38 Abnormal          +---------+------------------+-----+----------+-------+ +-------+-----------+-----------+------------+------------+ ABI/TBIToday's ABIToday's TBIPrevious ABIPrevious TBI +-------+-----------+-----------+------------+------------+ Right  1.28       .79        1.03        .74          +-------+-----------+-----------+------------+------------+ Left   .79        .39        .62         .23          +-------+-----------+-----------+------------+------------+  Left ABIs and TBIs appear increased compared to prior study on 01/2021.  Summary: Right: Resting right ankle-brachial index is within  normal range. No evidence of significant right lower extremity arterial disease. The right toe-brachial index is normal. Duplex added EIA patent. Ltd look at calf veins with normal compression. Left: Duplex added EIA and stent, CFA patent.  *See table(s) above for measurements and observations.  Electronically signed by [name] MD on 06/22/2021 at 11:15:03 AM.    Final    ECHOCARDIOGRAM COMPLETE  Result Date: 06/19/2021    ECHOCARDIOGRAM REPORT   Patient Name:   JHONEN KOSIBA Date of Exam: 06/19/2021 Medical Rec #:  [id number]        Height:       65.0 in Accession #:    [id number]       Weight:       214.3 lb Date of Birth:  1951/10/03  BSA:          2.037 m Patient Age:    27 years         BP:           124/45 mmHg Patient Gender: M                HR:           65 bpm. Exam Location:  [facility] Procedure: 2D Echo and Intracardiac Opacification Agent Indications:     Cardiac arrest I46.9  History:         Patient has prior history of Echocardiogram examinations, most                  recent 01/28/2019.  Sonographer:     Kathlen Brunswick RDCS Referring Phys:  [id number] [place] E GRAVES Diagnosing Phys: Kate Sable MD  Sonographer Comments: Technically challenging study due to limited acoustic windows and echo performed with patient supine and on artificial respirator. IMPRESSIONS  1. Left ventricular ejection fraction, by estimation, is 50%. The left ventricle has low normal function. The left ventricle has no regional wall motion abnormalities. Left ventricular diastolic function could not be evaluated.  2. Right ventricular systolic function is low normal. The right ventricular size is not well visualized.  3. The mitral valve is normal in structure. No evidence of mitral valve regurgitation.  4. The aortic valve is grossly normal. Aortic valve regurgitation is not visualized. FINDINGS  Left Ventricle: Left ventricular ejection fraction, by estimation, is 50%. The left ventricle has low normal function. The  left ventricle has no regional wall motion abnormalities. Definity contrast agent was given IV to delineate the left ventricular endocardial borders. The left ventricular internal cavity size was normal in size. There is no left ventricular hypertrophy. Left ventricular diastolic function could not be evaluated. Right Ventricle: The right ventricular size is not well visualized. No increase in right ventricular wall thickness. Right ventricular systolic function is low normal. Left Atrium: Left atrial size was normal in size. Right Atrium: Right atrial size was not well visualized. Pericardium: There is no evidence of pericardial effusion. Mitral Valve: The mitral valve is normal in structure. No evidence of mitral valve regurgitation. Tricuspid Valve: The tricuspid valve is not well visualized. Tricuspid valve regurgitation is not demonstrated. Aortic Valve: The aortic valve is grossly normal. Aortic valve regurgitation is not visualized. Pulmonic Valve: The pulmonic valve was not well visualized. Pulmonic valve regurgitation is not visualized. Aorta: The aortic root is normal in size and structure. Venous: IVC assessment for right atrial pressure unable to be performed due to mechanical ventilation. IAS/Shunts: The interatrial septum was not well visualized.  LEFT VENTRICLE PLAX 2D LVIDd:         5.40 cm LVIDs:         4.20 cm LV PW:         1.10 cm LV IVS:        1.00 cm LVOT diam:     2.20 cm LVOT Area:     3.80 cm  LEFT ATRIUM         Index LA diam:    3.70 cm 1.82 cm/m   AORTA Ao Root diam: 3.40 cm Ao Asc diam:  3.40 cm  SHUNTS Systemic Diam: 2.20 cm Kate Sable MD Electronically signed by Kate Sable MD Signature Date/Time: 06/19/2021/3:43:59 PM    Final    VAS [place] LOWER EXTREMITY ARTERIAL DUPLEX  Result Date: 06/22/2021 LOWER EXTREMITY ARTERIAL DUPLEX STUDY  Patient Name:  INIGO COOPERSTEIN  Date of Exam:   06/18/2021 Medical Rec #: [id number]         Accession #:    [id number] Date of Birth:  23-Nov-1950          Patient Gender: M Patient Age:   89 years Exam Location:  [facility] Procedure:      VAS [place] LOWER EXTREMITY ARTERIAL DUPLEX Referring Phys: [name] --------------------------------------------------------------------------------  Indications: Peripheral artery disease.  Vascular Interventions: Bilat CIA EIA STENTS. Current ABI:            r- 1.28 ; l - .79 Performing Technologist: Concha [place] RVT  Examination Guidelines: A complete evaluation includes B-mode imaging, spectral Doppler, color Doppler, and power Doppler as needed of all accessible portions of each vessel. Bilateral testing is considered an integral part of a complete examination. Limited examinations for reoccurring indications may be performed as noted.  +----------+--------+-----+--------+--------+--------+ RIGHT     PSV cm/sRatioStenosisWaveformComments +----------+--------+-----+--------+--------+--------+ EIA Prox  256                  biphasic         +----------+--------+-----+--------+--------+--------+ EIA Mid   194                  biphasic         +----------+--------+-----+--------+--------+--------+ EIA Distal175                  biphasic         +----------+--------+-----+--------+--------+--------+ CFA Mid   73                                    +----------+--------+-----+--------+--------+--------+ POP Mid   81                   biphasic         +----------+--------+-----+--------+--------+--------+ PTA Distal84                   biphasic         +----------+--------+-----+--------+--------+--------+  +----------+--------+-----+--------+----------+--------+ LEFT      PSV cm/sRatioStenosisWaveform  Comments +----------+--------+-----+--------+----------+--------+ EIA Prox  239                  biphasic           +----------+--------+-----+--------+----------+--------+ EIA Mid   230                  biphasic            +----------+--------+-----+--------+----------+--------+ EIA Distal131                  biphasic           +----------+--------+-----+--------+----------+--------+ CFA Mid   148                  biphasic           +----------+--------+-----+--------+----------+--------+ SFA Mid   92                   monophasic         +----------+--------+-----+--------+----------+--------+ POP Mid   38                                      +----------+--------+-----+--------+----------+--------+ POP Distal  monophasic         +----------+--------+-----+--------+----------+--------+ PTA Distal45                   monophasic         +----------+--------+-----+--------+----------+--------+  Summary: Right: Moderate atherosclerosis. Biphasic flow in EIA CFA showimg evidence of patent iliac stents. Left: Moderate atherosclerosis. Biphasic flow in EIA CFA showimg evidence of patent iliac stents.  See table(s) above for measurements and observations. Electronically signed by [name] MD on 06/22/2021 at 9:14:38 AM.    Final    US Abdomen Limited RUQ (LIVER/GB)  Result Date: 06/18/2021 CLINICAL DATA:  Elevated liver enzymes EXAM: ULTRASOUND ABDOMEN LIMITED RIGHT UPPER QUADRANT COMPARISON:  None. FINDINGS: Gallbladder: Absent Common bile duct: Diameter: 5 mm in proximal diameter Liver: The hepatic parenchymal echogenicity is diffusely increased, the echotexture is mildly coarsened, and there is poor acoustic through transmission most in keeping with changes of moderate hepatic steatosis. No focal intrahepatic masses are identified. There is no intrahepatic biliary ductal dilation. Portal vein is patent on color Doppler imaging with normal direction of blood flow towards the liver. Other: No ascites IMPRESSION: Status post cholecystectomy. Moderate hepatic steatosis. Electronically Signed   By: Fidela Salisbury M.D.   On: 06/18/2021 20:03     Assessment and Recommendation  70  y.o. male with known previous myocardial infarction having right leg infection dehydration hypokalemia and apparent cardiac arrest with defibrillation but no evidence of worsening angina and elevation of troponin consistent with current issues and no apparent acute coronary syndrome on appropriate medication management 1.  Continue medication management for further risk reduction cardiovascular event with addition of isosorbide 2.  Further consideration of cardiac catheterization to assess concerns for distal LAD ischemia and possible restenosis of anastomosis of LIMA to LAD needing further intervention.  Patient understands the risk and benefits of cardiac catheterization.  This includes a possibility GastroGard attack infection bleeding blood clot.  He is low risk for conscious sedation 3.  Further treatment options after above  Signed, Serafina Royals M.D. FACC

## 2021-06-23 NOTE — Progress Notes (Signed)
Physical Therapy Treatment Patient Details Name: Damon English MRN: [id number] DOB: [birth year] Today's Date: 06/23/2021    History of Present Illness Pt is a 70 y/o M admitted on 06/18/21  after presenting to the ER following a cardiac arrest. Pt mechanically intubated per ED provider.  Pt hypertensive, however EKG x2 revealed complete heart block hr 50's and no concerning ST changes. 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 Pt noted to have severe hypokalemia & RLE cellulitis. 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 Supportive spouse at bedside and reports pt has been struggling with ADLs for some time prior to admission. Pt sleeps in lift chair at home and has been unable to get in/out of shower. Pt is A and O x 4 and agreeable to session. He required min assist to get out/in bed. Stood to [employer] with CGA + vcs. Pt did tolerate ambulation 60 ft with RW however severely limited by fatigue. During ambulation, cardiologist called and scheduled heart craterization for tomorrow. After ,lengthy discussion about DC disposition, pt and spouse feel rehab at [facility] is safest option to maximize pt's abilities with ADLs. Author will return tomorrow after procedure to re-assess and update recommendation.    Follow Up Recommendations  SNF;Supervision/Assistance - 24 hour;Supervision for mobility/OOB     Equipment Recommendations  None recommended by PT       Precautions / Restrictions Precautions Precautions: Fall Restrictions Weight Bearing Restrictions: No    Mobility  Bed Mobility Overal bed mobility: Needs Assistance Bed Mobility: Supine to Sit;Sit to Supine     Supine to sit: HOB elevated;Min assist Sit to supine: Min assist   General bed mobility comments: pt does require assistance to safely exit  and return to bed. increased time to perform with vcs for technique improvements    Transfers Overall transfer level: Needs assistance Equipment used: Rolling walker (2 wheeled) Transfers: Sit to/from Stand Sit to Stand: Min guard         General transfer comment: CGA from sklightly elevated bed height. Pt uses lift chair at home.  Ambulation/Gait Ambulation/Gait assistance: Min assist Gait Distance (Feet): 60 Feet Assistive device: Rolling walker (2 wheeled) Gait Pattern/deviations: Decreased step length - left;Decreased step length - right;Decreased stride length Gait velocity: decreased   General Gait Details: Pt was able to ambulate 60 ft however has to take ~ 4 standing rest breaks due to fatigue. Pt is sveerely deconditioned     Balance Overall balance assessment: Needs assistance Sitting-balance support: Feet supported;Bilateral upper extremity supported Sitting balance-Leahy Scale: Good     Standing balance support: During functional activity;Bilateral upper extremity supported Standing balance-Leahy Scale: Fair      Cognition Arousal/Alertness: Awake/alert Behavior During Therapy: WFL for tasks assessed/performed Overall Cognitive Status: Within Functional Limits for tasks assessed      General Comments: Pt is A and O x 4             Pertinent Vitals/Pain Pain Assessment: No/denies pain     PT Goals (current goals can now be found in the care plan section) Acute Rehab PT Goals Patient Stated Goal: get better Progress towards PT goals: Progressing toward goals    Frequency    Min 2X/week      PT Plan Discharge plan needs to be updated  AM-PAC PT "6 Clicks" Mobility   Outcome Measure  Help needed turning from your back to your side while in a flat bed without using bedrails?: A Little Help needed moving from lying on your back to sitting on the side of a flat bed without using bedrails?: A Little Help needed moving to and from a bed  to a chair (including a wheelchair)?: A Little Help needed standing up from a chair using your arms (e.g., wheelchair or bedside chair)?: A Little Help needed to walk in hospital room?: A Little Help needed climbing 3-5 steps with a railing? : A Lot 6 Click Score: 17    End of Session Equipment Utilized During Treatment: Gait belt Activity Tolerance: Patient limited by fatigue Patient left: in bed;with call bell/phone within reach;with bed alarm set;with family/visitor present Nurse Communication: Mobility status PT Visit Diagnosis: Unsteadiness on feet (R26.81);Muscle weakness (generalized) (M62.81)     Time: [id number] PT Time Calculation (min) (ACUTE ONLY): 22 min  Charges:  $Gait Training: 8-22 mins                     Julaine Fusi PTA 06/23/21, 4:38 PM

## 2021-06-24 LAB — NM MYOCAR MULTI W/SPECT W/WALL MOTION / EF
Estimated workload: 1
Exercise duration (min): 1 min
Exercise duration (sec): 18 s
LV dias vol: 72 mL (ref 62–150)
LV sys vol: 38 mL
Nuc Stress EF: 47 %
Peak HR: 96 {beats}/min
Percent HR: 63 %
Rest HR: 67 {beats}/min
Rest Nuclear Isotope Dose: 10 mCi
SDS: 10
SRS: 8
SSS: 13
ST Depression (mm): 0 mm
Stress Nuclear Isotope Dose: 30.8 mCi
TID: 1.09

## 2021-06-24 LAB — BASIC METABOLIC PANEL
Anion gap: 8 (ref 5–15)
BUN: 16 mg/dL (ref 8–23)
CO2: 23 mmol/L (ref 22–32)
Calcium: 8.8 mg/dL — ABNORMAL LOW (ref 8.9–10.3)
Chloride: 106 mmol/L (ref 98–111)
Creatinine, Ser: 0.91 mg/dL (ref 0.61–1.24)
GFR, Estimated: >60 mL/min (ref >60)
Glucose, Bld: 114 mg/dL — ABNORMAL HIGH (ref 70–99)
Potassium: 4.1 mmol/L (ref 3.5–5.1)
Sodium: 137 mmol/L (ref 135–145)

## 2021-06-24 LAB — GLUCOSE, CAPILLARY
Glucose-Capillary: 97 mg/dL (ref 70–99)
Glucose-Capillary: 180 mg/dL — ABNORMAL HIGH (ref 70–99)
Glucose-Capillary: 94 mg/dL (ref 70–99)
Glucose-Capillary: 132 mg/dL — ABNORMAL HIGH (ref 70–99)

## 2021-06-24 LAB — MAGNESIUM: Magnesium: 2.3 mg/dL (ref 1.7–2.4)

## 2021-06-24 LAB — RMSF, IGG, IFA: RMSF, IGG, IFA: <1:64 {titer}

## 2021-06-24 LAB — ROCKY MTN SPOTTED FVR ABS PNL(IGG+IGM)
RMSF IgG: POSITIVE — AB
RMSF IgM: 0.28 index (ref 0.00–0.89)

## 2021-06-24 MED ORDER — SODIUM CHLORIDE 0.9 % WEIGHT BASED INFUSION
3.0000 mL/kg/h | INTRAVENOUS | Status: DC
  Administered 2021-06-25: 3 mL/kg/h via INTRAVENOUS

## 2021-06-24 MED ORDER — FUROSEMIDE 10 MG/ML IJ SOLN
40.0000 mg | Freq: Once | INTRAMUSCULAR | Status: AC
  Administered 2021-06-24: 40 mg via INTRAVENOUS
  Filled 2021-06-24: qty 4

## 2021-06-24 MED ORDER — SODIUM CHLORIDE 0.9 % WEIGHT BASED INFUSION
1.0000 mL/kg/h | INTRAVENOUS | Status: DC
  Administered 2021-06-25: 1 mL/kg/h via INTRAVENOUS

## 2021-06-24 NOTE — Progress Notes (Signed)
Physical Therapy Treatment Patient Details Name: Damon English MRN: [id number] DOB: September 28, 1951 Today's Date: 06/24/2021    History of Present Illness Pt is a 70 y/o M admitted on 06/18/21  after presenting to the ER following a cardiac arrest. Pt mechanically intubated per ED provider.  Pt hypertensive, however EKG x2 revealed complete heart block hr 50's and no concerning ST changes. 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 Pt noted to have severe hypokalemia & RLE cellulitis. 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 He agrees to session and is cooperative and motivated throughout. Cardiac cath rescheduled till tomorrow. Pt was easily able to stand and ambulate with RW without LOB. HR elevated to 112 bpm with some SOB noted. Overall pt tolerated session well. Recommend DC home with HHPT once medically cleared. Will see pt after procedure to continue to assess pt for safest DC disposition.    Follow Up Recommendations  Home health PT;Supervision - Intermittent;Supervision for mobility/OOB     Equipment Recommendations  None recommended by PT       Precautions / Restrictions Precautions Precautions: Fall Restrictions Weight Bearing Restrictions: No    Mobility  Bed Mobility    General bed mobility comments: pt was sitting EOB upon arriving. was able to progress BLEs back into bed with supervision after ambulating    Transfers Overall transfer level: Needs assistance Equipment used: Rolling walker (2 wheeled) Transfers: Sit to/from Stand Sit to Stand: Supervision         General transfer comment: no physical assistance required  Ambulation/Gait Ambulation/Gait assistance: Supervision Gait Distance (Feet): 120 Feet Assistive device: Rolling walker (2 wheeled) Gait Pattern/deviations:  Decreased step length - left;Decreased step length - right;Decreased stride length Gait velocity: decreased   General Gait Details: Pt was easily able to ambulate 120 ft with RW without LOB. HR elevated to 112 with some SOB noted. Pt is very talkative but tolerated well. SOB resolves with prolonged seated rest.     Balance Overall balance assessment: Needs assistance Sitting-balance support: Feet supported;Bilateral upper extremity supported Sitting balance-Leahy Scale: Good     Standing balance support: During functional activity;Bilateral upper extremity supported Standing balance-Leahy Scale: Good Standing balance comment: no LOB with UE support        Cognition Arousal/Alertness: Awake/alert Behavior During Therapy: WFL for tasks assessed/performed Overall Cognitive Status: Within Functional Limits for tasks assessed      General Comments: Pt is A and O x 4             Pertinent Vitals/Pain Pain Assessment: No/denies pain Faces Pain Scale: No hurt Pain Descriptors / Indicators: Aching;Sore Pain Intervention(s): Limited activity within patient's tolerance;Monitored during session;Premedicated before session;Repositioned     PT Goals (current goals can now be found in the care plan section) Acute Rehab PT Goals Patient Stated Goal: get better Progress towards PT goals: Progressing toward goals    Frequency    Min 2X/week      PT Plan Discharge plan needs to be updated       AM-PAC PT "6 Clicks" Mobility   Outcome Measure  Help needed turning from your back to your side while in a flat bed without using bedrails?: A Little Help needed moving from lying on your back to sitting on the side of a flat bed without  using bedrails?: A Little Help needed moving to and from a bed to a chair (including a wheelchair)?: A Little Help needed standing up from a chair using your arms (e.g., wheelchair or bedside chair)?: A Little Help needed to walk in hospital room?: A  Little Help needed climbing 3-5 steps with a railing? : A Little 6 Click Score: 18    End of Session Equipment Utilized During Treatment: Gait belt Activity Tolerance: Patient limited by fatigue Patient left: in bed;with call bell/phone within reach;with bed alarm set;with family/visitor present Nurse Communication: Mobility status PT Visit Diagnosis: Unsteadiness on feet (R26.81);Muscle weakness (generalized) (M62.81)     Time: [id number] PT Time Calculation (min) (ACUTE ONLY): 25 min  Charges:  $Gait Training: 8-22 mins $Therapeutic Activity: 8-22 mins                     Julaine Fusi PTA 06/24/21, 10:53 AM

## 2021-06-24 NOTE — Consult Note (Signed)
PHARMACY CONSULT NOTE - FOLLOW UP  Pharmacy Consult for Electrolyte Monitoring and Replacement   Recent Labs: Potassium (mmol/L)  Date Value  06/24/2021 4.1  11/08/2013 3.4 (L)   Magnesium (mg/dL)  Date Value  06/24/2021 2.3   Calcium (mg/dL)  Date Value  06/24/2021 8.8 (L)   Calcium, Total (mg/dL)  Date Value  11/08/2013 9.3   Albumin (g/dL)  Date Value  06/23/2021 2.7 (L)   Phosphorus (mg/dL)  Date Value  06/23/2021 3.1   Sodium (mmol/L)  Date Value  06/24/2021 137  11/08/2013 136     Assessment: 70 yo male presented to [facility] ER on 09/2 following a cardiac arrest.  Per ER notes pts wife reported while the pt was leaving his outpatient doctors office he felt like he was going to pass out.  He sat on his walker and lost consciousness.  Pts wife reported he briefly regained consciousness several times EMS notified.  When EMS arrived pt unresponsive and had a pulse, but required defibrillation x2. On a ventilator.   9/6-7: Pt received 5mq Kcl PO x1 on 0906 and K decreased from 3.5>3.4. Will increase amount given today.  9/7-8: Pt received Kcl 451m PO x2; K 3.4>4.1.  Goal of Therapy:  Mag ~2.0 K~ 4.0 WNL  Plan:  K: 3.4>4.1 after receiving Kcl 4059mPO x2 on 9/07 No further repletion today. Other lytes WNL; No further repletion is necessary at this time Follow up with AM labs  BraLorna DibbleharmD,BCCP Clinical Pharmacist 06/24/2021 8:55 AM

## 2021-06-24 NOTE — H&P (View-Only) (Signed)
[facility] Encounter Note  Patient: Damon English / Admit Date: 06/18/2021 / Date of Encounter: 06/24/2021, 8:39 AM   Subjective: 9/5 patient is done very well overnight with no evidence of significant chest pain shortness of breath or other significant symptoms.  Prior to this event the patient had no evidence of worsening angina, in fact was improving with his medical management and anginal symptoms.  Cardiac arrest was in the setting potassium of 2.2 and possible sepsis.  Troponin peak was 1533 possibly consistent with CPR.  Echocardiogram showing low normal LV systolic function ejection fraction of 50%.  Patient has tolerated appropriate medication management with no evidence of heart failure or angina since admission  9/6 no changes from yesterday with no evidence of rhythm disturbances or other significant chest discomfort or concerns.  Patient does complain of shortness of breath with physical activity multifactorial in nature and unchanged from before.  Patient had caffeine use therefore cannot perform stress test to evaluate and/or risk stratify for discharged home  9/7.  Patient has done overall well overnight with no evidence of chest discomfort or evidence of rhythm disturbances.  Telemetry has shown no evidence of any rhythm disturbances that could have caused any syncope.  The patient was admitted for an apparent cardiac arrest although had severe hypokalemia at potassium of 2.2.  Peak troponin was 1545 consistent with either demand ischemia non-ST elevation myocardial infarction and or CPR with or without defibrillation.  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  The patient also had occlusion of saphenous vein graft to obtuse marginal which has been medically managed but no evidence of ischemia in  that distribution at this time.  Therefore, will discuss with interventional team to assess for the possibility of cardiac catheterization to assess need for further intervention of LIMA to LAD anastomosis  9/8.  Patient is done very well overnight with no evidence of chest pain or significant shortness of breath or rhythm disturbances.  Patient has normal glomerular filtration rate and normal potassium and magnesium at this time.  There has been a little bit rehabilitation with no evidence of anginal symptoms.  Patient does have abdominal bloating that is worrisome to the patient and the family.  This may suggest some fluid retention quiring reintroduction of Lasix. Review of Systems: Positive for: None Negative for: Vision change, hearing change, syncope, dizziness, nausea, vomiting,diarrhea, bloody stool, stomach pain, cough, congestion, diaphoresis, urinary frequency, urinary pain,skin lesions, skin rashes Others previously listed  Objective: Telemetry: Normal sinus rhythm Physical Exam: Blood pressure (!) 168/79, pulse 66, temperature 98 F (36.7 C), resp. rate 17, height '5\' 5"'$  (1.651 m), weight 96.4 kg, SpO2 99 %. Body mass index is 35.38 kg/m. General: Well developed, well nourished, in no acute distress. Head: Normocephalic, atraumatic, sclera non-icteric, no xanthomas, nares are without discharge. Neck: No apparent masses Lungs: Normal respirations with no wheezes, no rhonchi, no rales , no crackles   Heart: Regular rate and rhythm, normal S1 S2, no murmur, no rub, no gallop, PMI is normal size and placement, carotid upstroke normal without bruit, jugular venous pressure normal Abdomen: Soft, non-tender, non-distended with normoactive bowel sounds. No hepatosplenomegaly. Abdominal aorta is normal size without bruit Extremities: No edema, no clubbing, no cyanosis, no ulcers,  Peripheral: 2+ radial, 2+ femoral, 2+ dorsal pedal pulses Neuro: Alert and oriented. Moves all  extremities  spontaneously. Psych:  Responds to questions appropriately with a normal affect.   Intake/Output Summary (Last 24 hours) at 06/24/2021 0839 Last data filed at 06/24/2021 0500 Gross per 24 hour  Intake 480 ml  Output 750 ml  Net -270 ml     Inpatient Medications:   acidophilus  2 capsule Oral TID   aspirin  81 mg Oral Pre-Cath   brimonidine  1 drop Both Eyes BID   Chlorhexidine Gluconate Cloth  6 each Topical Daily   clopidogrel  75 mg Oral Daily   docusate sodium  100 mg Oral BID   doxycycline  100 mg Oral [id number]   folic acid  1 mg Oral Daily   furosemide  40 mg Intravenous Once   guaiFENesin  600 mg Oral BID   insulin aspart  0-15 Units Subcutaneous TID AC & HS   isosorbide mononitrate  30 mg Oral Daily   latanoprost  1 drop Both Eyes QHS   levalbuterol  0.63 mg Nebulization Once   losartan  50 mg Oral Daily   metoprolol succinate  50 mg Oral BID   multivitamin with minerals  1 tablet Oral Daily   pantoprazole (PROTONIX) IV  40 mg Intravenous QHS   sodium chloride flush  3 mL Intravenous Q12H   thiamine injection  100 mg Intravenous Daily   Infusions:   sodium chloride     sodium chloride     [START ON 06/25/2021] sodium chloride     Followed by   [START ON 06/25/2021] sodium chloride      Labs: Recent Labs    06/22/21 0351 06/23/21 0240 06/24/21 0422  NA 137 136 137  K 3.5 3.4* 4.1  CL 108 109 106  CO2 22 21* 23  GLUCOSE 115* 101* 114*  BUN '16 15 16  '$ CREATININE 0.94 0.91 0.91  CALCIUM 8.6* 8.5* 8.8*  MG 2.0 1.9 2.3  PHOS 2.9 3.1  --     Recent Labs    06/22/21 0351 06/23/21 0240  AST 18 21  ALT 20 19  ALKPHOS 45 44  BILITOT 0.9 0.9  PROT 6.5 6.1*  ALBUMIN 2.8* 2.7*    Recent Labs    06/22/21 0351 06/23/21 0240  WBC 6.2 6.0  HGB 11.1* 10.8*  HCT 34.2* 32.4*  MCV 95.0 95.3  PLT 162 163    No results for input(s): CKTOTAL, CKMB, TROPONINI in the last 72 hours. Invalid input(s): POCBNP No results for input(s): HGBA1C in the last 72 hours.     Weights: Filed Weights   06/23/21 0422 06/23/21 0500 06/24/21 0500  Weight: 103.2 kg 103.2 kg 96.4 kg     Radiology/Studies:  DG Abdomen 1 View  Result Date: 06/18/2021 CLINICAL DATA:  Orogastric tube placement EXAM: ABDOMEN - 1 VIEW COMPARISON:  6:26 p.m. FINDINGS: Orogastric tube tip now overlies the gastric fundus. Visualized lung bases are clear. No free intraperitoneal gas. The abdominal gas pattern is indeterminate due to a paucity of intra-abdominal gas. Pelvis excluded from view. IMPRESSION: Orogastric tube tip within the gastric fundus. Electronically Signed   By: Fidela Salisbury M.D.   On: 06/18/2021 21:15   DG Abd 1 View  Result Date: 06/18/2021 CLINICAL DATA:  Encounter for NG tube placement EXAM: ABDOMEN - 1 VIEW COMPARISON:  None. FINDINGS: Defibrillator pads overlie the upper abdomen. There is no evidence of bowel obstruction. There is no visible NG tube. The NG tube appears to be overlying the midesophagus on separately dictated chest radiograph. Prior  L3-L5 fusion. No acute osseous abnormality. Bilateral iliac stents IMPRESSION: No visible nasogastric tube overlying the abdomen. See separately dictated chest radiograph. Electronically Signed   By: Maurine Simmering M.D.   On: 06/18/2021 18:53   CT HEAD WO CONTRAST (5MM)  Result Date: 06/18/2021 CLINICAL DATA:  Mental status change.  Patient intubated EXAM: CT HEAD WITHOUT CONTRAST TECHNIQUE: Contiguous axial images were obtained from the base of the skull through the vertex without intravenous contrast. COMPARISON:  None. FINDINGS: Brain: Hypodensity left parietal lobe involving cortex and white matter compatible with chronic infarct. Patchy white matter hypodensity bilaterally likely chronic. Negative for hydrocephalus. Negative for acute infarct, hemorrhage or mass. 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 Electronically Signed   By: Franchot Gallo M.D.   On: 06/18/2021 16:20   CT Angio Chest PE W and/or Wo Contrast  Addendum Date: 06/19/2021   ADDENDUM REPORT: 06/19/2021 10:11 ADDENDUM: Upon further review, there is a mildly displaced acute fracture of the right 6th rib anteriorly (image 64/6). In addition, there are possible nondisplaced fractures the right anterior 4th, 5th and 7th ribs. There is also a probable fracture of the left 6th rib anteriorly. No pneumothorax or pleural effusion. These results were discussed by telephone at the time of interpretation on 06/19/2021 at 09:55 am with provider Derrill Kay, who verbally acknowledged these results. Electronically Signed   By: Richardean Sale M.D.   On: 06/19/2021 10:11   Result Date: 06/19/2021 CLINICAL DATA:  PE suspected, high probability.  Intubated patient. EXAM: CT ANGIOGRAPHY CHEST WITH CONTRAST TECHNIQUE: Multidetector CT imaging of the chest was performed using the standard protocol during bolus administration of intravenous contrast. Multiplanar CT image reconstructions and MIPs were obtained to evaluate the vascular anatomy. CONTRAST:  79m OMNIPAQUE IOHEXOL 350 MG/ML SOLN COMPARISON:  Radiographs 01/28/2019. FINDINGS: Cardiovascular: The pulmonary arteries are well opacified with contrast to the level of the subsegmental branches. There is no evidence of acute pulmonary embolism. There is essentially no contrast opacification of the systemic arterial system. Patient is status post median sternotomy and CABG. There is diffuse atherosclerosis of the aorta, great vessels and coronary arteries. There is some reflux of contrast into the IVC and hepatic veins. The heart size is normal. There is no pericardial effusion. Mediastinum/Nodes: Tip of the endotracheal tube is in the mid trachea. There are no enlarged mediastinal, hilar or axillary lymph nodes. The thyroid gland, trachea and esophagus  demonstrate no significant findings. Lungs/Pleura: There is no pleural effusion. Mild centrilobular emphysema with dependent pulmonary opacities bilaterally, favoring atelectasis. No consolidation or suspicious pulmonary nodularity. Triangular-shaped 4 mm right middle lobe nodule on image 47/6, likely benign. Upper abdomen: As above, there is some reflux of contrast into the IVC and hepatic veins. The liver contours are mildly irregular. The gallbladder is surgically absent. Musculoskeletal/Chest wall: There is no chest wall mass or suspicious osseous finding. Previous median sternotomy. Multilevel thoracic spondylosis. Review of the MIP images confirms the above findings. IMPRESSION: 1. No evidence of acute pulmonary embolism or other acute chest process. 2. Diffuse coronary and Aortic Atherosclerosis (ICD10-I70.0). Previous CABG. 3. Mild emphysema with probable mild dependent atelectasis in both lungs. 4. Potential changes of early cirrhosis. Electronically Signed: By: WRichardean SaleM.D. On: 06/18/2021 16:20   CT FEMUR RIGHT WO CONTRAST  Result Date: 06/18/2021 CLINICAL DATA:  Recent insect bite with fever and right  lower extremity pain and edema. EXAM: CT OF THE RIGHT FEMUR, LOWER LEG AND FOOT WITHOUT CONTRAST TECHNIQUE: Multidetector CT imaging of the right lower extremity was performed according to the standard protocol. The images extend from the lower right pelvis through the right foot. Multiplanar CT image reconstructions were also generated. COMPARISON:  None. FINDINGS: Bones/Joint/Cartilage No evidence of acute fracture or dislocation. Status post proximal right femoral dynamic plate and screw fixation of an intertrochanteric femur fracture. The hardware is intact without loosening. There is irregular periosteal ossification anteriorly, likely chronic and related to the original injury. The distal femur, tibia, fibula and foot demonstrate no significant findings. No large joint effusions are seen.  Ligaments Suboptimally assessed by CT. Muscles and Tendons No focal muscular atrophy or fluid collection identified. The extensor mechanism is intact at the knee. The ankle tendons appear intact and normally located. Soft tissues Bilateral external iliac stents are noted with postsurgical changes from right femoropopliteal bypass grafting. There is extensive atherosclerosis of the underlying native arteries in the right lower extremity. Foley catheter is in place with mild bladder wall thickening. There is contrast in the bladder related to the preceding chest CTA. Postsurgical changes are present in the subcutaneous fat lateral to the proximal right femur, attributed to the previous ORIF. No inflammatory changes or fluid collections are seen within the thigh. There is diffuse subcutaneous edema within the lower leg, greatest distally. No focal fluid collection, soft tissue emphysema or foreign body identified. This edema extends into the foot. IMPRESSION: 1. Diffuse subcutaneous edema within the lower leg and foot consistent with soft tissue infection (cellulitis). No focal fluid collection or unexpected foreign body identified. 2. No evidence of deep soft tissue infection, septic arthritis or osteomyelitis. 3. Previous right proximal femoral ORIF without acute osseous findings. 4. Extensive atherosclerosis post right femoropopliteal bypass grafting. Electronically Signed   By: Richardean Sale M.D.   On: 06/18/2021 20:52   CT TIBIA FIBULA RIGHT WO CONTRAST  Result Date: 06/18/2021 CLINICAL DATA:  Recent insect bite with fever and right lower extremity pain and edema. EXAM: CT OF THE RIGHT FEMUR, LOWER LEG AND FOOT WITHOUT CONTRAST TECHNIQUE: Multidetector CT imaging of the right lower extremity was performed according to the standard protocol. The images extend from the lower right pelvis through the right foot. Multiplanar CT image reconstructions were also generated. COMPARISON:  None. FINDINGS:  Bones/Joint/Cartilage No evidence of acute fracture or dislocation. Status post proximal right femoral dynamic plate and screw fixation of an intertrochanteric femur fracture. The hardware is intact without loosening. There is irregular periosteal ossification anteriorly, likely chronic and related to the original injury. The distal femur, tibia, fibula and foot demonstrate no significant findings. No large joint effusions are seen. Ligaments Suboptimally assessed by CT. Muscles and Tendons No focal muscular atrophy or fluid collection identified. The extensor mechanism is intact at the knee. The ankle tendons appear intact and normally located. Soft tissues Bilateral external iliac stents are noted with postsurgical changes from right femoropopliteal bypass grafting. There is extensive atherosclerosis of the underlying native arteries in the right lower extremity. Foley catheter is in place with mild bladder wall thickening. There is contrast in the bladder related to the preceding chest CTA. Postsurgical changes are present in the subcutaneous fat lateral to the proximal right femur, attributed to the previous ORIF. No inflammatory changes or fluid collections are seen within the thigh. There is diffuse subcutaneous edema within the lower leg, greatest distally. No focal fluid collection, soft tissue emphysema  or foreign body identified. This edema extends into the foot. IMPRESSION: 1. Diffuse subcutaneous edema within the lower leg and foot consistent with soft tissue infection (cellulitis). No focal fluid collection or unexpected foreign body identified. 2. No evidence of deep soft tissue infection, septic arthritis or osteomyelitis. 3. Previous right proximal femoral ORIF without acute osseous findings. 4. Extensive atherosclerosis post right femoropopliteal bypass grafting. Electronically Signed   By: Richardean Sale M.D.   On: 06/18/2021 20:52   CT FOOT RIGHT WO CONTRAST  Result Date: 06/18/2021 CLINICAL  DATA:  Recent insect bite with fever and right lower extremity pain and edema. EXAM: CT OF THE RIGHT FEMUR, LOWER LEG AND FOOT WITHOUT CONTRAST TECHNIQUE: Multidetector CT imaging of the right lower extremity was performed according to the standard protocol. The images extend from the lower right pelvis through the right foot. Multiplanar CT image reconstructions were also generated. COMPARISON:  None. FINDINGS: Bones/Joint/Cartilage No evidence of acute fracture or dislocation. Status post proximal right femoral dynamic plate and screw fixation of an intertrochanteric femur fracture. The hardware is intact without loosening. There is irregular periosteal ossification anteriorly, likely chronic and related to the original injury. The distal femur, tibia, fibula and foot demonstrate no significant findings. No large joint effusions are seen. Ligaments Suboptimally assessed by CT. Muscles and Tendons No focal muscular atrophy or fluid collection identified. The extensor mechanism is intact at the knee. The ankle tendons appear intact and normally located. Soft tissues Bilateral external iliac stents are noted with postsurgical changes from right femoropopliteal bypass grafting. There is extensive atherosclerosis of the underlying native arteries in the right lower extremity. Foley catheter is in place with mild bladder wall thickening. There is contrast in the bladder related to the preceding chest CTA. Postsurgical changes are present in the subcutaneous fat lateral to the proximal right femur, attributed to the previous ORIF. No inflammatory changes or fluid collections are seen within the thigh. There is diffuse subcutaneous edema within the lower leg, greatest distally. No focal fluid collection, soft tissue emphysema or foreign body identified. This edema extends into the foot. IMPRESSION: 1. Diffuse subcutaneous edema within the lower leg and foot consistent with soft tissue infection (cellulitis). No focal  fluid collection or unexpected foreign body identified. 2. No evidence of deep soft tissue infection, septic arthritis or osteomyelitis. 3. Previous right proximal femoral ORIF without acute osseous findings. 4. Extensive atherosclerosis post right femoropopliteal bypass grafting. Electronically Signed   By: Richardean Sale M.D.   On: 06/18/2021 20:52   NM Myocar Multi W/Spect Tamela Oddi Motion / EF  Result Date: 06/23/2021   Findings are consistent with ischemia. The study is high risk.   No ST deviation was noted.   LV perfusion is abnormal. There is evidence of ischemia. Defect 1: There is a large defect with severe reduction in uptake present in the apical to mid anterior and anteroseptal location(s) that is reversible. Consistent with ischemia.   Left ventricular function is abnormal. Nuclear stress EF: 47 %. The left ventricular ejection fraction is mildly decreased (45-54%). Conclusion: Abnormal high risk study There is a large, severe, mostly reversible defect involving the mid to apical anterior wall (LAD distribution) that is consistent with ischemia. Mildly decreased LV systolic function.   DG Chest Port 1 View  Result Date: 06/21/2021 CLINICAL DATA:  Cough. EXAM: PORTABLE CHEST 1 VIEW COMPARISON:  Heart is enlarged. Atherosclerotic changes are present at the aortic arch. Lung volumes are low. No edema or effusion is present. No  focal airspace disease is present. FINDINGS: Heart is enlarged. Atherosclerotic changes are present at the aortic arch. Lung volumes are low. No edema or effusion is present. No focal airspace disease is present. IMPRESSION: 1. Cardiomegaly without failure. 2. Low lung volumes. Electronically Signed   By: San Morelle M.D.   On: 06/21/2021 16:09   DG Chest Port 1 View  Result Date: 06/18/2021 CLINICAL DATA:  Encounter for ET tube and OG tube placement EXAM: PORTABLE CHEST 1 VIEW COMPARISON:  Same day CT chest FINDINGS: Endotracheal tube overlies the midthoracic  trachea. There is an orogastric tube with tip overlying the mid esophagus. Fibular pads overlie the left lower chest. Enlarged cardiac silhouette. Bibasilar subsegmental atelectasis. There is no focal airspace consolidation. There is no large pleural effusion or visible pneumothorax. There is no acute osseous abnormality. IMPRESSION: Endotracheal tube tip overlies the midthoracic trachea. Orogastric tube tip overlies the mid esophagus, recommend advancement. Electronically Signed   By: Maurine Simmering M.D.   On: 06/18/2021 18:56   VAS [place] ABI WITH/WO TBI  Result Date: 06/22/2021  LOWER EXTREMITY DOPPLER STUDY Patient Name:  SENDER STARR  Date of Exam:   06/18/2021 Medical Rec #: [id number]         Accession #:    [id number] Date of Birth: Nov 07, 1950          Patient Gender: M Patient Age:   40 years Exam Location:  [facility] Procedure:      VAS [place] ABI WITH/WO TBI Referring Phys: Corene Cornea DEW --------------------------------------------------------------------------------  Indications: Peripheral artery disease.  Vascular Interventions: 01/14/2021 Bilateral CIA and EIA stents. Comparison Study: 01/2021 Performing Technologist: Concha Norway RVT  Examination Guidelines: A complete evaluation includes at minimum, Doppler waveform signals and systolic blood pressure reading at the level of bilateral brachial, anterior tibial, and posterior tibial arteries, when vessel segments are accessible. Bilateral testing is considered an integral part of a complete examination. Photoelectric Plethysmograph (PPG) waveforms and toe systolic pressure readings are included as required and additional duplex testing as needed. Limited examinations for reoccurring indications may be performed as noted.  ABI Findings: +---------+------------------+-----+--------+--------+ Right    Rt Pressure (mmHg)IndexWaveformComment  +---------+------------------+-----+--------+--------+ Brachial 141                                      +---------+------------------+-----+--------+--------+ ATA      160                    biphasic1.13     +---------+------------------+-----+--------+--------+ PTA      181               1.28 biphasic         +---------+------------------+-----+--------+--------+ [facility]               0.79 Normal           +---------+------------------+-----+--------+--------+ +---------+------------------+-----+----------+-------+ Left     Lt Pressure (mmHg)IndexWaveform  Comment +---------+------------------+-----+----------+-------+ ATA      112                    monophasic.79     +---------+------------------+-----+----------+-------+ PTA      107               0.76 monophasic        +---------+------------------+-----+----------+-------+ Great Toe53                0.38  Abnormal          +---------+------------------+-----+----------+-------+ +-------+-----------+-----------+------------+------------+ ABI/TBIToday's ABIToday's TBIPrevious ABIPrevious TBI +-------+-----------+-----------+------------+------------+ Right  1.28       .79        1.03        .74          +-------+-----------+-----------+------------+------------+ Left   .79        .39        .62         .23          +-------+-----------+-----------+------------+------------+  Left ABIs and TBIs appear increased compared to prior study on 01/2021.  Summary: Right: Resting right ankle-brachial index is within normal range. No evidence of significant right lower extremity arterial disease. The right toe-brachial index is normal. Duplex added EIA patent. Ltd look at calf veins with normal compression. Left: Duplex added EIA and stent, CFA patent.  *See table(s) above for measurements and observations.  Electronically signed by [name] MD on 06/22/2021 at 11:15:03 AM.    Final    ECHOCARDIOGRAM COMPLETE  Result Date: 06/19/2021    ECHOCARDIOGRAM REPORT   Patient Name:   JRAKE BANKES Date of Exam:  06/19/2021 Medical Rec #:  [id number]        Height:       65.0 in Accession #:    [id number]       Weight:       214.3 lb Date of Birth:  10-27-1950         BSA:          2.037 m Patient Age:    19 years         BP:           124/45 mmHg Patient Gender: M                HR:           65 bpm. Exam Location:  [facility] Procedure: 2D Echo and Intracardiac Opacification Agent Indications:     Cardiac arrest I46.9  History:         Patient has prior history of Echocardiogram examinations, most                  recent 01/28/2019.  Sonographer:     Kathlen Brunswick RDCS Referring Phys:  [id number] [place] E GRAVES Diagnosing Phys: Kate Sable MD  Sonographer Comments: Technically challenging study due to limited acoustic windows and echo performed with patient supine and on artificial respirator. IMPRESSIONS  1. Left ventricular ejection fraction, by estimation, is 50%. The left ventricle has low normal function. The left ventricle has no regional wall motion abnormalities. Left ventricular diastolic function could not be evaluated.  2. Right ventricular systolic function is low normal. The right ventricular size is not well visualized.  3. The mitral valve is normal in structure. No evidence of mitral valve regurgitation.  4. The aortic valve is grossly normal. Aortic valve regurgitation is not visualized. FINDINGS  Left Ventricle: Left ventricular ejection fraction, by estimation, is 50%. The left ventricle has low normal function. The left ventricle has no regional wall motion abnormalities. Definity contrast agent was given IV to delineate the left ventricular endocardial borders. The left ventricular internal cavity size was normal in size. There is no left ventricular hypertrophy. Left ventricular diastolic function could not be evaluated. Right Ventricle: The right ventricular size is not well visualized. No increase in right ventricular wall thickness. Right ventricular systolic function is low normal. Left Atrium:  Left  atrial size was normal in size. Right Atrium: Right atrial size was not well visualized. Pericardium: There is no evidence of pericardial effusion. Mitral Valve: The mitral valve is normal in structure. No evidence of mitral valve regurgitation. Tricuspid Valve: The tricuspid valve is not well visualized. Tricuspid valve regurgitation is not demonstrated. Aortic Valve: The aortic valve is grossly normal. Aortic valve regurgitation is not visualized. Pulmonic Valve: The pulmonic valve was not well visualized. Pulmonic valve regurgitation is not visualized. Aorta: The aortic root is normal in size and structure. Venous: IVC assessment for right atrial pressure unable to be performed due to mechanical ventilation. IAS/Shunts: The interatrial septum was not well visualized.  LEFT VENTRICLE PLAX 2D LVIDd:         5.40 cm LVIDs:         4.20 cm LV PW:         1.10 cm LV IVS:        1.00 cm LVOT diam:     2.20 cm LVOT Area:     3.80 cm  LEFT ATRIUM         Index LA diam:    3.70 cm 1.82 cm/m   AORTA Ao Root diam: 3.40 cm Ao Asc diam:  3.40 cm  SHUNTS Systemic Diam: 2.20 cm Kate Sable MD Electronically signed by Kate Sable MD Signature Date/Time: 06/19/2021/3:43:59 PM    Final    VAS [place] LOWER EXTREMITY ARTERIAL DUPLEX  Result Date: 06/22/2021 LOWER EXTREMITY ARTERIAL DUPLEX STUDY Patient Name:  EYAS MORON  Date of Exam:   06/18/2021 Medical Rec #: [id number]         Accession #:    [id number] Date of Birth: 09/11/1951          Patient Gender: M Patient Age:   89 years Exam Location:  [facility] Procedure:      VAS [place] LOWER EXTREMITY ARTERIAL DUPLEX Referring Phys: [name] --------------------------------------------------------------------------------  Indications: Peripheral artery disease.  Vascular Interventions: Bilat CIA EIA STENTS. Current ABI:            r- 1.28 ; l - .79 Performing Technologist: Concha [place] RVT  Examination Guidelines: A complete evaluation includes B-mode imaging,  spectral Doppler, color Doppler, and power Doppler as needed of all accessible portions of each vessel. Bilateral testing is considered an integral part of a complete examination. Limited examinations for reoccurring indications may be performed as noted.  +----------+--------+-----+--------+--------+--------+ RIGHT     PSV cm/sRatioStenosisWaveformComments +----------+--------+-----+--------+--------+--------+ EIA Prox  256                  biphasic         +----------+--------+-----+--------+--------+--------+ EIA Mid   194                  biphasic         +----------+--------+-----+--------+--------+--------+ EIA Distal175                  biphasic         +----------+--------+-----+--------+--------+--------+ CFA Mid   73                                    +----------+--------+-----+--------+--------+--------+ POP Mid   81                   biphasic         +----------+--------+-----+--------+--------+--------+ PTA Distal84  biphasic         +----------+--------+-----+--------+--------+--------+  +----------+--------+-----+--------+----------+--------+ LEFT      PSV cm/sRatioStenosisWaveform  Comments +----------+--------+-----+--------+----------+--------+ EIA Prox  239                  biphasic           +----------+--------+-----+--------+----------+--------+ EIA Mid   230                  biphasic           +----------+--------+-----+--------+----------+--------+ EIA Distal131                  biphasic           +----------+--------+-----+--------+----------+--------+ CFA Mid   148                  biphasic           +----------+--------+-----+--------+----------+--------+ SFA Mid   92                   monophasic         +----------+--------+-----+--------+----------+--------+ POP Mid   38                                      +----------+--------+-----+--------+----------+--------+ POP  Distal                     monophasic         +----------+--------+-----+--------+----------+--------+ PTA Distal45                   monophasic         +----------+--------+-----+--------+----------+--------+  Summary: Right: Moderate atherosclerosis. Biphasic flow in EIA CFA showimg evidence of patent iliac stents. Left: Moderate atherosclerosis. Biphasic flow in EIA CFA showimg evidence of patent iliac stents.  See table(s) above for measurements and observations. Electronically signed by [name] MD on 06/22/2021 at 9:14:38 AM.    Final    US Abdomen Limited RUQ (LIVER/GB)  Result Date: 06/18/2021 CLINICAL DATA:  Elevated liver enzymes EXAM: ULTRASOUND ABDOMEN LIMITED RIGHT UPPER QUADRANT COMPARISON:  None. FINDINGS: Gallbladder: Absent Common bile duct: Diameter: 5 mm in proximal diameter Liver: The hepatic parenchymal echogenicity is diffusely increased, the echotexture is mildly coarsened, and there is poor acoustic through transmission most in keeping with changes of moderate hepatic steatosis. No focal intrahepatic masses are identified. There is no intrahepatic biliary ductal dilation. Portal vein is patent on color Doppler imaging with normal direction of blood flow towards the liver. Other: No ascites IMPRESSION: Status post cholecystectomy. Moderate hepatic steatosis. Electronically Signed   By: Fidela Salisbury M.D.   On: 06/18/2021 20:03     Assessment and Recommendation  70 y.o. male with known previous myocardial infarction having right leg infection dehydration hypokalemia and apparent cardiac arrest with defibrillation but no evidence of worsening angina and elevation of troponin consistent with current issues and no apparent acute coronary syndrome on appropriate medication management 1.  Continue medication management for further risk reduction cardiovascular event with addition of intravenous dosage of 40 mg of Lasix for abdominal bloating and shortness of breath to continue to  reduce possibilities of combined systolic diastolic dysfunction congestive heart failure 2.  Further consideration of cardiac catheterization to assess concerns for distal LAD ischemia and possible restenosis of anastomosis of LIMA to LAD needing further intervention.  Patient understands the risk and benefits of cardiac catheterization.  This includes a possibility GastroGard attack infection bleeding blood clot.  He is low risk for conscious sedation 3.  Further treatment options after above  Signed, Serafina Royals M.D. FACC

## 2021-06-24 NOTE — Progress Notes (Signed)
[facility] Encounter Note  Patient: Damon English / Admit Date: 06/18/2021 / Date of Encounter: 06/24/2021, 8:39 AM   Subjective: 9/5 patient is done very well overnight with no evidence of significant chest pain shortness of breath or other significant symptoms.  Prior to this event the patient had no evidence of worsening angina, in fact was improving with his medical management and anginal symptoms.  Cardiac arrest was in the setting potassium of 2.2 and possible sepsis.  Troponin peak was 1533 possibly consistent with CPR.  Echocardiogram showing low normal LV systolic function ejection fraction of 50%.  Patient has tolerated appropriate medication management with no evidence of heart failure or angina since admission  9/6 no changes from yesterday with no evidence of rhythm disturbances or other significant chest discomfort or concerns.  Patient does complain of shortness of breath with physical activity multifactorial in nature and unchanged from before.  Patient had caffeine use therefore cannot perform stress test to evaluate and/or risk stratify for discharged home  9/7.  Patient has done overall well overnight with no evidence of chest discomfort or evidence of rhythm disturbances.  Telemetry has shown no evidence of any rhythm disturbances that could have caused any syncope.  The patient was admitted for an apparent cardiac arrest although had severe hypokalemia at potassium of 2.2.  Peak troponin was 1545 consistent with either demand ischemia non-ST elevation myocardial infarction and or CPR with or without defibrillation.  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  The patient also had occlusion of saphenous vein graft to obtuse marginal which has been medically managed but no evidence of ischemia in  that distribution at this time.  Therefore, will discuss with interventional team to assess for the possibility of cardiac catheterization to assess need for further intervention of LIMA to LAD anastomosis  9/8.  Patient is done very well overnight with no evidence of chest pain or significant shortness of breath or rhythm disturbances.  Patient has normal glomerular filtration rate and normal potassium and magnesium at this time.  There has been a little bit rehabilitation with no evidence of anginal symptoms.  Patient does have abdominal bloating that is worrisome to the patient and the family.  This may suggest some fluid retention quiring reintroduction of Lasix. Review of Systems: Positive for: None Negative for: Vision change, hearing change, syncope, dizziness, nausea, vomiting,diarrhea, bloody stool, stomach pain, cough, congestion, diaphoresis, urinary frequency, urinary pain,skin lesions, skin rashes Others previously listed  Objective: Telemetry: Normal sinus rhythm Physical Exam: Blood pressure (!) 168/79, pulse 66, temperature 98 F (36.7 C), resp. rate 17, height '5\' 5"'$  (1.651 m), weight 96.4 kg, SpO2 99 %. Body mass index is 35.38 kg/m. General: Well developed, well nourished, in no acute distress. Head: Normocephalic, atraumatic, sclera non-icteric, no xanthomas, nares are without discharge. Neck: No apparent masses Lungs: Normal respirations with no wheezes, no rhonchi, no rales , no crackles   Heart: Regular rate and rhythm, normal S1 S2, no murmur, no rub, no gallop, PMI is normal size and placement, carotid upstroke normal without bruit, jugular venous pressure normal Abdomen: Soft, non-tender, non-distended with normoactive bowel sounds. No hepatosplenomegaly. Abdominal aorta is normal size without bruit Extremities: No edema, no clubbing, no cyanosis, no ulcers,  Peripheral: 2+ radial, 2+ femoral, 2+ dorsal pedal pulses Neuro: Alert and oriented. Moves all  extremities  spontaneously. Psych:  Responds to questions appropriately with a normal affect.   Intake/Output Summary (Last 24 hours) at 06/24/2021 0839 Last data filed at 06/24/2021 0500 Gross per 24 hour  Intake 480 ml  Output 750 ml  Net -270 ml     Inpatient Medications:   acidophilus  2 capsule Oral TID   aspirin  81 mg Oral Pre-Cath   brimonidine  1 drop Both Eyes BID   Chlorhexidine Gluconate Cloth  6 each Topical Daily   clopidogrel  75 mg Oral Daily   docusate sodium  100 mg Oral BID   doxycycline  100 mg Oral [id number]   folic acid  1 mg Oral Daily   furosemide  40 mg Intravenous Once   guaiFENesin  600 mg Oral BID   insulin aspart  0-15 Units Subcutaneous TID AC & HS   isosorbide mononitrate  30 mg Oral Daily   latanoprost  1 drop Both Eyes QHS   levalbuterol  0.63 mg Nebulization Once   losartan  50 mg Oral Daily   metoprolol succinate  50 mg Oral BID   multivitamin with minerals  1 tablet Oral Daily   pantoprazole (PROTONIX) IV  40 mg Intravenous QHS   sodium chloride flush  3 mL Intravenous Q12H   thiamine injection  100 mg Intravenous Daily   Infusions:   sodium chloride     sodium chloride     [START ON 06/25/2021] sodium chloride     Followed by   [START ON 06/25/2021] sodium chloride      Labs: Recent Labs    06/22/21 0351 06/23/21 0240 06/24/21 0422  NA 137 136 137  K 3.5 3.4* 4.1  CL 108 109 106  CO2 22 21* 23  GLUCOSE 115* 101* 114*  BUN '16 15 16  '$ CREATININE 0.94 0.91 0.91  CALCIUM 8.6* 8.5* 8.8*  MG 2.0 1.9 2.3  PHOS 2.9 3.1  --     Recent Labs    06/22/21 0351 06/23/21 0240  AST 18 21  ALT 20 19  ALKPHOS 45 44  BILITOT 0.9 0.9  PROT 6.5 6.1*  ALBUMIN 2.8* 2.7*    Recent Labs    06/22/21 0351 06/23/21 0240  WBC 6.2 6.0  HGB 11.1* 10.8*  HCT 34.2* 32.4*  MCV 95.0 95.3  PLT 162 163    No results for input(s): CKTOTAL, CKMB, TROPONINI in the last 72 hours. Invalid input(s): POCBNP No results for input(s): HGBA1C in the last 72 hours.     Weights: Filed Weights   06/23/21 0422 06/23/21 0500 06/24/21 0500  Weight: 103.2 kg 103.2 kg 96.4 kg     Radiology/Studies:  DG Abdomen 1 View  Result Date: 06/18/2021 CLINICAL DATA:  Orogastric tube placement EXAM: ABDOMEN - 1 VIEW COMPARISON:  6:26 p.m. FINDINGS: Orogastric tube tip now overlies the gastric fundus. Visualized lung bases are clear. No free intraperitoneal gas. The abdominal gas pattern is indeterminate due to a paucity of intra-abdominal gas. Pelvis excluded from view. IMPRESSION: Orogastric tube tip within the gastric fundus. Electronically Signed   By: Fidela Salisbury M.D.   On: 06/18/2021 21:15   DG Abd 1 View  Result Date: 06/18/2021 CLINICAL DATA:  Encounter for NG tube placement EXAM: ABDOMEN - 1 VIEW COMPARISON:  None. FINDINGS: Defibrillator pads overlie the upper abdomen. There is no evidence of bowel obstruction. There is no visible NG tube. The NG tube appears to be overlying the midesophagus on separately dictated chest radiograph. Prior  L3-L5 fusion. No acute osseous abnormality. Bilateral iliac stents IMPRESSION: No visible nasogastric tube overlying the abdomen. See separately dictated chest radiograph. Electronically Signed   By: Maurine Simmering M.D.   On: 06/18/2021 18:53   CT HEAD WO CONTRAST (5MM)  Result Date: 06/18/2021 CLINICAL DATA:  Mental status change.  Patient intubated EXAM: CT HEAD WITHOUT CONTRAST TECHNIQUE: Contiguous axial images were obtained from the base of the skull through the vertex without intravenous contrast. COMPARISON:  None. FINDINGS: Brain: Hypodensity left parietal lobe involving cortex and white matter compatible with chronic infarct. Patchy white matter hypodensity bilaterally likely chronic. Negative for hydrocephalus. Negative for acute infarct, hemorrhage or mass. 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 Electronically Signed   By: Franchot Gallo M.D.   On: 06/18/2021 16:20   CT Angio Chest PE W and/or Wo Contrast  Addendum Date: 06/19/2021   ADDENDUM REPORT: 06/19/2021 10:11 ADDENDUM: Upon further review, there is a mildly displaced acute fracture of the right 6th rib anteriorly (image 64/6). In addition, there are possible nondisplaced fractures the right anterior 4th, 5th and 7th ribs. There is also a probable fracture of the left 6th rib anteriorly. No pneumothorax or pleural effusion. These results were discussed by telephone at the time of interpretation on 06/19/2021 at 09:55 am with provider Derrill Kay, who verbally acknowledged these results. Electronically Signed   By: Richardean Sale M.D.   On: 06/19/2021 10:11   Result Date: 06/19/2021 CLINICAL DATA:  PE suspected, high probability.  Intubated patient. EXAM: CT ANGIOGRAPHY CHEST WITH CONTRAST TECHNIQUE: Multidetector CT imaging of the chest was performed using the standard protocol during bolus administration of intravenous contrast. Multiplanar CT image reconstructions and MIPs were obtained to evaluate the vascular anatomy. CONTRAST:  77m OMNIPAQUE IOHEXOL 350 MG/ML SOLN COMPARISON:  Radiographs 01/28/2019. FINDINGS: Cardiovascular: The pulmonary arteries are well opacified with contrast to the level of the subsegmental branches. There is no evidence of acute pulmonary embolism. There is essentially no contrast opacification of the systemic arterial system. Patient is status post median sternotomy and CABG. There is diffuse atherosclerosis of the aorta, great vessels and coronary arteries. There is some reflux of contrast into the IVC and hepatic veins. The heart size is normal. There is no pericardial effusion. Mediastinum/Nodes: Tip of the endotracheal tube is in the mid trachea. There are no enlarged mediastinal, hilar or axillary lymph nodes. The thyroid gland, trachea and esophagus  demonstrate no significant findings. Lungs/Pleura: There is no pleural effusion. Mild centrilobular emphysema with dependent pulmonary opacities bilaterally, favoring atelectasis. No consolidation or suspicious pulmonary nodularity. Triangular-shaped 4 mm right middle lobe nodule on image 47/6, likely benign. Upper abdomen: As above, there is some reflux of contrast into the IVC and hepatic veins. The liver contours are mildly irregular. The gallbladder is surgically absent. Musculoskeletal/Chest wall: There is no chest wall mass or suspicious osseous finding. Previous median sternotomy. Multilevel thoracic spondylosis. Review of the MIP images confirms the above findings. IMPRESSION: 1. No evidence of acute pulmonary embolism or other acute chest process. 2. Diffuse coronary and Aortic Atherosclerosis (ICD10-I70.0). Previous CABG. 3. Mild emphysema with probable mild dependent atelectasis in both lungs. 4. Potential changes of early cirrhosis. Electronically Signed: By: WRichardean SaleM.D. On: 06/18/2021 16:20   CT FEMUR RIGHT WO CONTRAST  Result Date: 06/18/2021 CLINICAL DATA:  Recent insect bite with fever and right  lower extremity pain and edema. EXAM: CT OF THE RIGHT FEMUR, LOWER LEG AND FOOT WITHOUT CONTRAST TECHNIQUE: Multidetector CT imaging of the right lower extremity was performed according to the standard protocol. The images extend from the lower right pelvis through the right foot. Multiplanar CT image reconstructions were also generated. COMPARISON:  None. FINDINGS: Bones/Joint/Cartilage No evidence of acute fracture or dislocation. Status post proximal right femoral dynamic plate and screw fixation of an intertrochanteric femur fracture. The hardware is intact without loosening. There is irregular periosteal ossification anteriorly, likely chronic and related to the original injury. The distal femur, tibia, fibula and foot demonstrate no significant findings. No large joint effusions are seen.  Ligaments Suboptimally assessed by CT. Muscles and Tendons No focal muscular atrophy or fluid collection identified. The extensor mechanism is intact at the knee. The ankle tendons appear intact and normally located. Soft tissues Bilateral external iliac stents are noted with postsurgical changes from right femoropopliteal bypass grafting. There is extensive atherosclerosis of the underlying native arteries in the right lower extremity. Foley catheter is in place with mild bladder wall thickening. There is contrast in the bladder related to the preceding chest CTA. Postsurgical changes are present in the subcutaneous fat lateral to the proximal right femur, attributed to the previous ORIF. No inflammatory changes or fluid collections are seen within the thigh. There is diffuse subcutaneous edema within the lower leg, greatest distally. No focal fluid collection, soft tissue emphysema or foreign body identified. This edema extends into the foot. IMPRESSION: 1. Diffuse subcutaneous edema within the lower leg and foot consistent with soft tissue infection (cellulitis). No focal fluid collection or unexpected foreign body identified. 2. No evidence of deep soft tissue infection, septic arthritis or osteomyelitis. 3. Previous right proximal femoral ORIF without acute osseous findings. 4. Extensive atherosclerosis post right femoropopliteal bypass grafting. Electronically Signed   By: Richardean Sale M.D.   On: 06/18/2021 20:52   CT TIBIA FIBULA RIGHT WO CONTRAST  Result Date: 06/18/2021 CLINICAL DATA:  Recent insect bite with fever and right lower extremity pain and edema. EXAM: CT OF THE RIGHT FEMUR, LOWER LEG AND FOOT WITHOUT CONTRAST TECHNIQUE: Multidetector CT imaging of the right lower extremity was performed according to the standard protocol. The images extend from the lower right pelvis through the right foot. Multiplanar CT image reconstructions were also generated. COMPARISON:  None. FINDINGS:  Bones/Joint/Cartilage No evidence of acute fracture or dislocation. Status post proximal right femoral dynamic plate and screw fixation of an intertrochanteric femur fracture. The hardware is intact without loosening. There is irregular periosteal ossification anteriorly, likely chronic and related to the original injury. The distal femur, tibia, fibula and foot demonstrate no significant findings. No large joint effusions are seen. Ligaments Suboptimally assessed by CT. Muscles and Tendons No focal muscular atrophy or fluid collection identified. The extensor mechanism is intact at the knee. The ankle tendons appear intact and normally located. Soft tissues Bilateral external iliac stents are noted with postsurgical changes from right femoropopliteal bypass grafting. There is extensive atherosclerosis of the underlying native arteries in the right lower extremity. Foley catheter is in place with mild bladder wall thickening. There is contrast in the bladder related to the preceding chest CTA. Postsurgical changes are present in the subcutaneous fat lateral to the proximal right femur, attributed to the previous ORIF. No inflammatory changes or fluid collections are seen within the thigh. There is diffuse subcutaneous edema within the lower leg, greatest distally. No focal fluid collection, soft tissue emphysema  or foreign body identified. This edema extends into the foot. IMPRESSION: 1. Diffuse subcutaneous edema within the lower leg and foot consistent with soft tissue infection (cellulitis). No focal fluid collection or unexpected foreign body identified. 2. No evidence of deep soft tissue infection, septic arthritis or osteomyelitis. 3. Previous right proximal femoral ORIF without acute osseous findings. 4. Extensive atherosclerosis post right femoropopliteal bypass grafting. Electronically Signed   By: Richardean Sale M.D.   On: 06/18/2021 20:52   CT FOOT RIGHT WO CONTRAST  Result Date: 06/18/2021 CLINICAL  DATA:  Recent insect bite with fever and right lower extremity pain and edema. EXAM: CT OF THE RIGHT FEMUR, LOWER LEG AND FOOT WITHOUT CONTRAST TECHNIQUE: Multidetector CT imaging of the right lower extremity was performed according to the standard protocol. The images extend from the lower right pelvis through the right foot. Multiplanar CT image reconstructions were also generated. COMPARISON:  None. FINDINGS: Bones/Joint/Cartilage No evidence of acute fracture or dislocation. Status post proximal right femoral dynamic plate and screw fixation of an intertrochanteric femur fracture. The hardware is intact without loosening. There is irregular periosteal ossification anteriorly, likely chronic and related to the original injury. The distal femur, tibia, fibula and foot demonstrate no significant findings. No large joint effusions are seen. Ligaments Suboptimally assessed by CT. Muscles and Tendons No focal muscular atrophy or fluid collection identified. The extensor mechanism is intact at the knee. The ankle tendons appear intact and normally located. Soft tissues Bilateral external iliac stents are noted with postsurgical changes from right femoropopliteal bypass grafting. There is extensive atherosclerosis of the underlying native arteries in the right lower extremity. Foley catheter is in place with mild bladder wall thickening. There is contrast in the bladder related to the preceding chest CTA. Postsurgical changes are present in the subcutaneous fat lateral to the proximal right femur, attributed to the previous ORIF. No inflammatory changes or fluid collections are seen within the thigh. There is diffuse subcutaneous edema within the lower leg, greatest distally. No focal fluid collection, soft tissue emphysema or foreign body identified. This edema extends into the foot. IMPRESSION: 1. Diffuse subcutaneous edema within the lower leg and foot consistent with soft tissue infection (cellulitis). No focal  fluid collection or unexpected foreign body identified. 2. No evidence of deep soft tissue infection, septic arthritis or osteomyelitis. 3. Previous right proximal femoral ORIF without acute osseous findings. 4. Extensive atherosclerosis post right femoropopliteal bypass grafting. Electronically Signed   By: Richardean Sale M.D.   On: 06/18/2021 20:52   NM Myocar Multi W/Spect Tamela Oddi Motion / EF  Result Date: 06/23/2021   Findings are consistent with ischemia. The study is high risk.   No ST deviation was noted.   LV perfusion is abnormal. There is evidence of ischemia. Defect 1: There is a large defect with severe reduction in uptake present in the apical to mid anterior and anteroseptal location(s) that is reversible. Consistent with ischemia.   Left ventricular function is abnormal. Nuclear stress EF: 47 %. The left ventricular ejection fraction is mildly decreased (45-54%). Conclusion: Abnormal high risk study There is a large, severe, mostly reversible defect involving the mid to apical anterior wall (LAD distribution) that is consistent with ischemia. Mildly decreased LV systolic function.   DG Chest Port 1 View  Result Date: 06/21/2021 CLINICAL DATA:  Cough. EXAM: PORTABLE CHEST 1 VIEW COMPARISON:  Heart is enlarged. Atherosclerotic changes are present at the aortic arch. Lung volumes are low. No edema or effusion is present. No  focal airspace disease is present. FINDINGS: Heart is enlarged. Atherosclerotic changes are present at the aortic arch. Lung volumes are low. No edema or effusion is present. No focal airspace disease is present. IMPRESSION: 1. Cardiomegaly without failure. 2. Low lung volumes. Electronically Signed   By: San Morelle M.D.   On: 06/21/2021 16:09   DG Chest Port 1 View  Result Date: 06/18/2021 CLINICAL DATA:  Encounter for ET tube and OG tube placement EXAM: PORTABLE CHEST 1 VIEW COMPARISON:  Same day CT chest FINDINGS: Endotracheal tube overlies the midthoracic  trachea. There is an orogastric tube with tip overlying the mid esophagus. Fibular pads overlie the left lower chest. Enlarged cardiac silhouette. Bibasilar subsegmental atelectasis. There is no focal airspace consolidation. There is no large pleural effusion or visible pneumothorax. There is no acute osseous abnormality. IMPRESSION: Endotracheal tube tip overlies the midthoracic trachea. Orogastric tube tip overlies the mid esophagus, recommend advancement. Electronically Signed   By: Maurine Simmering M.D.   On: 06/18/2021 18:56   VAS [place] ABI WITH/WO TBI  Result Date: 06/22/2021  LOWER EXTREMITY DOPPLER STUDY Patient Name:  Damon English  Date of Exam:   06/18/2021 Medical Rec #: [id number]         Accession #:    [id number] Date of Birth: [birth year]          Patient Gender: M Patient Age:   42 years Exam Location:  [facility] Procedure:      VAS [place] ABI WITH/WO TBI Referring Phys: Corene Cornea DEW --------------------------------------------------------------------------------  Indications: Peripheral artery disease.  Vascular Interventions: 01/14/2021 Bilateral CIA and EIA stents. Comparison Study: 01/2021 Performing Technologist: Concha Norway RVT  Examination Guidelines: A complete evaluation includes at minimum, Doppler waveform signals and systolic blood pressure reading at the level of bilateral brachial, anterior tibial, and posterior tibial arteries, when vessel segments are accessible. Bilateral testing is considered an integral part of a complete examination. Photoelectric Plethysmograph (PPG) waveforms and toe systolic pressure readings are included as required and additional duplex testing as needed. Limited examinations for reoccurring indications may be performed as noted.  ABI Findings: +---------+------------------+-----+--------+--------+ Right    Rt Pressure (mmHg)IndexWaveformComment  +---------+------------------+-----+--------+--------+ Brachial 141                                      +---------+------------------+-----+--------+--------+ ATA      160                    biphasic1.13     +---------+------------------+-----+--------+--------+ PTA      181               1.28 biphasic         +---------+------------------+-----+--------+--------+ [facility]               0.79 Normal           +---------+------------------+-----+--------+--------+ +---------+------------------+-----+----------+-------+ Left     Lt Pressure (mmHg)IndexWaveform  Comment +---------+------------------+-----+----------+-------+ ATA      112                    monophasic.79     +---------+------------------+-----+----------+-------+ PTA      107               0.76 monophasic        +---------+------------------+-----+----------+-------+ Great Toe53                0.38  Abnormal          +---------+------------------+-----+----------+-------+ +-------+-----------+-----------+------------+------------+ ABI/TBIToday's ABIToday's TBIPrevious ABIPrevious TBI +-------+-----------+-----------+------------+------------+ Right  1.28       .79        1.03        .74          +-------+-----------+-----------+------------+------------+ Left   .79        .39        .62         .23          +-------+-----------+-----------+------------+------------+  Left ABIs and TBIs appear increased compared to prior study on 01/2021.  Summary: Right: Resting right ankle-brachial index is within normal range. No evidence of significant right lower extremity arterial disease. The right toe-brachial index is normal. Duplex added EIA patent. Ltd look at calf veins with normal compression. Left: Duplex added EIA and stent, CFA patent.  *See table(s) above for measurements and observations.  Electronically signed by [name] MD on 06/22/2021 at 11:15:03 AM.    Final    ECHOCARDIOGRAM COMPLETE  Result Date: 06/19/2021    ECHOCARDIOGRAM REPORT   Patient Name:   Damon English Date of Exam:  06/19/2021 Medical Rec #:  [id number]        Height:       65.0 in Accession #:    [id number]       Weight:       214.3 lb Date of Birth:  [birth year]         BSA:          2.037 m Patient Age:    70 years         BP:           124/45 mmHg Patient Gender: M                HR:           65 bpm. Exam Location:  [facility] Procedure: 2D Echo and Intracardiac Opacification Agent Indications:     Cardiac arrest I46.9  History:         Patient has prior history of Echocardiogram examinations, most                  recent 01/28/2019.  Sonographer:     Kathlen Brunswick RDCS Referring Phys:  [id number] Dunbar E GRAVES Diagnosing Phys: Kate Sable MD  Sonographer Comments: Technically challenging study due to limited acoustic windows and echo performed with patient supine and on artificial respirator. IMPRESSIONS  1. Left ventricular ejection fraction, by estimation, is 50%. The left ventricle has low normal function. The left ventricle has no regional wall motion abnormalities. Left ventricular diastolic function could not be evaluated.  2. Right ventricular systolic function is low normal. The right ventricular size is not well visualized.  3. The mitral valve is normal in structure. No evidence of mitral valve regurgitation.  4. The aortic valve is grossly normal. Aortic valve regurgitation is not visualized. FINDINGS  Left Ventricle: Left ventricular ejection fraction, by estimation, is 50%. The left ventricle has low normal function. The left ventricle has no regional wall motion abnormalities. Definity contrast agent was given IV to delineate the left ventricular endocardial borders. The left ventricular internal cavity size was normal in size. There is no left ventricular hypertrophy. Left ventricular diastolic function could not be evaluated. Right Ventricle: The right ventricular size is not well visualized. No increase in right ventricular wall thickness. Right ventricular systolic function is low normal. Left Atrium:  Left  atrial size was normal in size. Right Atrium: Right atrial size was not well visualized. Pericardium: There is no evidence of pericardial effusion. Mitral Valve: The mitral valve is normal in structure. No evidence of mitral valve regurgitation. Tricuspid Valve: The tricuspid valve is not well visualized. Tricuspid valve regurgitation is not demonstrated. Aortic Valve: The aortic valve is grossly normal. Aortic valve regurgitation is not visualized. Pulmonic Valve: The pulmonic valve was not well visualized. Pulmonic valve regurgitation is not visualized. Aorta: The aortic root is normal in size and structure. Venous: IVC assessment for right atrial pressure unable to be performed due to mechanical ventilation. IAS/Shunts: The interatrial septum was not well visualized.  LEFT VENTRICLE PLAX 2D LVIDd:         5.40 cm LVIDs:         4.20 cm LV PW:         1.10 cm LV IVS:        1.00 cm LVOT diam:     2.20 cm LVOT Area:     3.80 cm  LEFT ATRIUM         Index LA diam:    3.70 cm 1.82 cm/m   AORTA Ao Root diam: 3.40 cm Ao Asc diam:  3.40 cm  SHUNTS Systemic Diam: 2.20 cm Kate Sable MD Electronically signed by Kate Sable MD Signature Date/Time: 06/19/2021/3:43:59 PM    Final    VAS [place] LOWER EXTREMITY ARTERIAL DUPLEX  Result Date: 06/22/2021 LOWER EXTREMITY ARTERIAL DUPLEX STUDY Patient Name:  Damon English  Date of Exam:   06/18/2021 Medical Rec #: [id number]         Accession #:    [id number] Date of Birth: [birth year]          Patient Gender: M Patient Age:   30 years Exam Location:  [facility] Procedure:      VAS [place] LOWER EXTREMITY ARTERIAL DUPLEX Referring Phys: [name] --------------------------------------------------------------------------------  Indications: Peripheral artery disease.  Vascular Interventions: Bilat CIA EIA STENTS. Current ABI:            r- 1.28 ; l - .79 Performing Technologist: Concha Norway RVT  Examination Guidelines: A complete evaluation includes B-mode imaging,  spectral Doppler, color Doppler, and power Doppler as needed of all accessible portions of each vessel. Bilateral testing is considered an integral part of a complete examination. Limited examinations for reoccurring indications may be performed as noted.  +----------+--------+-----+--------+--------+--------+ RIGHT     PSV cm/sRatioStenosisWaveformComments +----------+--------+-----+--------+--------+--------+ EIA Prox  256                  biphasic         +----------+--------+-----+--------+--------+--------+ EIA Mid   194                  biphasic         +----------+--------+-----+--------+--------+--------+ EIA Distal175                  biphasic         +----------+--------+-----+--------+--------+--------+ CFA Mid   73                                    +----------+--------+-----+--------+--------+--------+ POP Mid   81                   biphasic         +----------+--------+-----+--------+--------+--------+ PTA Distal84  biphasic         +----------+--------+-----+--------+--------+--------+  +----------+--------+-----+--------+----------+--------+ LEFT      PSV cm/sRatioStenosisWaveform  Comments +----------+--------+-----+--------+----------+--------+ EIA Prox  239                  biphasic           +----------+--------+-----+--------+----------+--------+ EIA Mid   230                  biphasic           +----------+--------+-----+--------+----------+--------+ EIA Distal131                  biphasic           +----------+--------+-----+--------+----------+--------+ CFA Mid   148                  biphasic           +----------+--------+-----+--------+----------+--------+ SFA Mid   92                   monophasic         +----------+--------+-----+--------+----------+--------+ POP Mid   38                                      +----------+--------+-----+--------+----------+--------+ POP  Distal                     monophasic         +----------+--------+-----+--------+----------+--------+ PTA Distal45                   monophasic         +----------+--------+-----+--------+----------+--------+  Summary: Right: Moderate atherosclerosis. Biphasic flow in EIA CFA showimg evidence of patent iliac stents. Left: Moderate atherosclerosis. Biphasic flow in EIA CFA showimg evidence of patent iliac stents.  See table(s) above for measurements and observations. Electronically signed by [name] MD on 06/22/2021 at 9:14:38 AM.    Final    US Abdomen Limited RUQ (LIVER/GB)  Result Date: 06/18/2021 CLINICAL DATA:  Elevated liver enzymes EXAM: ULTRASOUND ABDOMEN LIMITED RIGHT UPPER QUADRANT COMPARISON:  None. FINDINGS: Gallbladder: Absent Common bile duct: Diameter: 5 mm in proximal diameter Liver: The hepatic parenchymal echogenicity is diffusely increased, the echotexture is mildly coarsened, and there is poor acoustic through transmission most in keeping with changes of moderate hepatic steatosis. No focal intrahepatic masses are identified. There is no intrahepatic biliary ductal dilation. Portal vein is patent on color Doppler imaging with normal direction of blood flow towards the liver. Other: No ascites IMPRESSION: Status post cholecystectomy. Moderate hepatic steatosis. Electronically Signed   By: Fidela Salisbury M.D.   On: 06/18/2021 20:03     Assessment and Recommendation  70 y.o. male with known previous myocardial infarction having right leg infection dehydration hypokalemia and apparent cardiac arrest with defibrillation but no evidence of worsening angina and elevation of troponin consistent with current issues and no apparent acute coronary syndrome on appropriate medication management 1.  Continue medication management for further risk reduction cardiovascular event with addition of intravenous dosage of 40 mg of Lasix for abdominal bloating and shortness of breath to continue to  reduce possibilities of combined systolic diastolic dysfunction congestive heart failure 2.  Further consideration of cardiac catheterization to assess concerns for distal LAD ischemia and possible restenosis of anastomosis of LIMA to LAD needing further intervention.  Patient understands the risk and benefits of cardiac catheterization.  This includes a possibility GastroGard attack infection bleeding blood clot.  He is low risk for conscious sedation 3.  Further treatment options after above  Signed, Serafina Royals M.D. FACC

## 2021-06-24 NOTE — Progress Notes (Signed)
PROGRESS NOTE    Damon English  [id number] DOB: Jul 17, 1951 DOA: 06/18/2021 PCP: [name], PA-C    Brief Narrative:  Patient is a 70 year old male with history of alcohol abuse, chronic systolic congestive heart failure, coronary artery disease, type 2 diabetes, who presents to the hospital after cardiac arrest.  He received defibrillation by EMS prior to arrival in the hospital.  She was intubated after arriving the emergency room. Patient also had a potassium of 2.2, which is presumed to be the cardiac arrest.  CT angiogram of the chest did not show PE. Echocardiogram showed ejection fraction 50%.  No regional abnormality.   Assessment & Plan:   Active Problems:   NSTEMI (non-ST elevated myocardial infarction) (HCC)   Chronic systolic CHF (congestive heart failure) (HCC)   Ischemic cardiomyopathy   Type 2 diabetes mellitus (HCC)   Cardiac arrest (HCC)   ETOH abuse   Acute respiratory failure with hypoxia and hypercapnia (HCC)   On mechanically assisted ventilation (HCC)   Cellulitis of leg, right   Sepsis ([facility])   Acute kidney injury (AKI) with acute tubular necrosis (ATN) (HCC)   Acute encephalopathy   Hypokalemia   Transaminitis   Pressure injury of skin  #1.  Cardiac arrest with ventricular fibrillation secondary to severe hypokalemia. Elevated troponin secondary to cardiac arrest. Complete AV block, resolved. Chronic systolic congestive heart failure.  Orthostatic hypotension. Coronary artery disease status post CABG. Condition improved.  Patient did allow longer has any confusion. Stress nuclear test showed ischemia, scheduled for heart cath tomorrow.  2.  Acute kidney injury secondary to ATN. Hypokalemia. Conditions are resolved.  Continue monitor potassium level.   3.  Sepsis is secondary to right lower extremity cellulitis. Right lower extremity cellulitis. Recent tick bite. Continue complete 2 weeks of doxycycline.  4.  Acute metabolic  encephalopathy. Alcohol use disorder. Condition improved.  5.  History of DVT. Hold off Eliquis for heart cath.  6.  Type 2 diabetes. Continue current regimen.  7.  Liver cirrhosis.      DVT prophylaxis: Eliquis on hold Code Status: Full Family Communication:  Disposition Plan:    Status is: Inpatient  Remains inpatient appropriate because:Ongoing diagnostic testing needed not appropriate for outpatient work up and Inpatient level of care appropriate due to severity of illness  Dispo: The patient is from: Home              Anticipated d/c is to: SNF              Patient currently is not medically stable to d/c.   Difficult to place patient No        I/O last 3 completed shifts: In: 480 [P.O.:480] Out: 1075 [Urine:1075] Total I/O In: 240 [P.O.:240] Out: [place] [Urine:1225]     Consultants:  Cardiology  Procedures:   Antimicrobials:  Doxycycline. Subjective: Patient doing well, slept well last night.  No confusion today. Denies any chest pain short of breath. No abdominal pain or nausea vomiting No dysuria hematuria.  Objective: Vitals:   06/24/21 0405 06/24/21 0500 06/24/21 0729 06/24/21 1145  BP: (!) 169/85  (!) 168/79 102/61  Pulse: 76  66 69  Resp: '18  17 17  '$ Temp: 97.6 F (36.4 C)  98 F (36.7 C) 98.7 F (37.1 C)  TempSrc:      SpO2: 97%  99% 98%  Weight:  96.4 kg    Height:        Intake/Output Summary (Last 24 hours) at 06/24/2021 1202  Last data filed at 06/24/2021 1147 Gross per 24 hour  Intake 720 ml  Output 1975 ml  Net -1255 ml   Filed Weights   06/23/21 0422 06/23/21 0500 06/24/21 0500  Weight: 103.2 kg 103.2 kg 96.4 kg    Examination:  General exam: Appears calm and comfortable  Respiratory system: Clear to auscultation. Respiratory effort normal. Cardiovascular system: S1 & S2 heard, RRR. No JVD, murmurs, rubs, gallops or clicks. No pedal edema. Gastrointestinal system: Abdomen is nondistended, soft and nontender. No  organomegaly or masses felt. Normal bowel sounds heard. Central nervous system: Alert and oriented. No focal neurological deficits. Extremities: Symmetric 5 x 5 power. Skin: No rashes, lesions or ulcers Psychiatry: Judgement and insight appear normal. Mood & affect appropriate.     Data Reviewed: I have personally reviewed following labs and imaging studies  CBC: Recent Labs  Lab 06/18/21 1513 06/18/21 2100 06/19/21 0630 06/20/21 0225 06/21/21 0431 06/22/21 0351 06/23/21 0240  WBC 12.0*   < > 10.9* 7.9 7.0 6.2 6.0  NEUTROABS 8.5*  --   --   --   --   --   --   HGB 14.5   < > 12.3* 11.3* 10.4* 11.1* 10.8*  HCT 43.4   < > 34.6* 33.8* 30.9* 34.2* 32.4*  MCV 91.9   < > 92.0 96.6 95.7 95.0 95.3  PLT 201   < > 156 136* 149* 162 163   < > = values in this interval not displayed.   Basic Metabolic Panel: Recent Labs  Lab 06/19/21 0630 06/19/21 1657 06/20/21 0225 06/20/21 1625 06/21/21 0431 06/22/21 0351 06/23/21 0240 06/24/21 0422  NA 139  --  136  --  137 137 136 137  K 2.8*   < > 3.0* 3.4* 3.5 3.5 3.4* 4.1  CL 101  --  103  --  108 108 109 106  CO2 28  --  26  --  22 22 21* 23  GLUCOSE 147*  --  108*  --  120* 115* 101* 114*  BUN 21  --  17  --  '15 16 15 16  '$ CREATININE 1.48*  --  1.34*  --  1.15 0.94 0.91 0.91  CALCIUM 8.5*  --  8.0*  --  8.2* 8.6* 8.5* 8.8*  MG 1.7   < > 2.0  --  1.9 2.0 1.9 2.3  PHOS 3.3  --  2.4*  --  2.8 2.9 3.1  --    < > = values in this interval not displayed.   GFR: Estimated Creatinine Clearance: 83 mL/min (by C-G formula based on SCr of 0.91 mg/dL). Liver Function Tests: Recent Labs  Lab 06/19/21 0630 06/20/21 0225 06/21/21 0431 06/22/21 0351 06/23/21 0240  AST 41 '30 19 18 21  '$ ALT 42 34 '23 20 19  '$ ALKPHOS 46 48 45 45 44  BILITOT 0.8 1.0 1.0 0.9 0.9  PROT 5.7* 6.2* 5.9* 6.5 6.1*  ALBUMIN 2.4* 2.8* 2.6* 2.8* 2.7*   No results for input(s): LIPASE, AMYLASE in the last 168 hours. No results for input(s): AMMONIA in the last 168  hours. Coagulation Profile: Recent Labs  Lab 06/18/21 1731  INR 1.6*   Cardiac Enzymes: No results for input(s): CKTOTAL, CKMB, CKMBINDEX, TROPONINI in the last 168 hours. BNP (last 3 results) No results for input(s): PROBNP in the last 8760 hours. HbA1C: No results for input(s): HGBA1C in the last 72 hours. CBG: Recent Labs  Lab 06/23/21 1234 06/23/21 1649 06/23/21 2047 06/24/21 [id number]  06/24/21 1145  GLUCAP 127* 114* 127* 97 180*   Lipid Profile: No results for input(s): CHOL, HDL, LDLCALC, TRIG, CHOLHDL, LDLDIRECT in the last 72 hours. Thyroid Function Tests: No results for input(s): TSH, T4TOTAL, FREET4, T3FREE, THYROIDAB in the last 72 hours. Anemia Panel: No results for input(s): VITAMINB12, FOLATE, FERRITIN, TIBC, IRON, RETICCTPCT in the last 72 hours. Sepsis Labs: Recent Labs  Lab 06/18/21 1731 06/18/21 2000 06/18/21 2100 06/19/21 0630 06/20/21 0225  PROCALCITON  --   --  0.31 0.38 0.25  LATICACIDVEN 2.6* 2.1*  --  1.3  --     Recent Results (from the past 240 hour(s))  Resp Panel by RT-PCR (Flu A&B, Covid) Nasopharyngeal Swab     Status: None   Collection Time: 06/18/21  3:12 PM   Specimen: Nasopharyngeal Swab; Nasopharyngeal(NP) swabs in vial transport medium  Result Value Ref Range Status   SARS Coronavirus 2 by RT PCR NEGATIVE NEGATIVE Final    Comment: (NOTE) SARS-CoV-2 target nucleic acids are NOT DETECTED.  The SARS-CoV-2 RNA is generally detectable in upper respiratory specimens during the acute phase of infection. The lowest concentration of SARS-CoV-2 viral copies this assay can detect is 138 copies/mL. A negative result does not preclude SARS-Cov-2 infection and should not be used as the sole basis for treatment or other patient management decisions. A negative result may occur with  improper specimen collection/handling, submission of specimen other than nasopharyngeal swab, presence of viral mutation(s) within the areas targeted by this  assay, and inadequate number of viral copies(<138 copies/mL). A negative result must be combined with clinical observations, patient history, and epidemiological information. The expected result is Negative.  Fact Sheet for Patients:  EntrepreneurPulse.com.au  Fact Sheet for Healthcare Providers:  IncredibleEmployment.be  This test is no t yet approved or cleared by the [place] FDA and  has been authorized for detection and/or diagnosis of SARS-CoV-2 by FDA under an Emergency Use Authorization (EUA). This EUA will remain  in effect (meaning this test can be used) for the duration of the COVID-19 declaration under Section 564(b)(1) of the Act, 21 U.S.C.section 360bbb-3(b)(1), unless the authorization is terminated  or revoked sooner.       Influenza A by PCR NEGATIVE NEGATIVE Final   Influenza B by PCR NEGATIVE NEGATIVE Final    Comment: (NOTE) The Xpert Xpress SARS-CoV-2/FLU/RSV plus assay is intended as an aid in the diagnosis of influenza from Nasopharyngeal swab specimens and should not be used as a sole basis for treatment. Nasal washings and aspirates are unacceptable for Xpert Xpress SARS-CoV-2/FLU/RSV testing.  Fact Sheet for Patients: EntrepreneurPulse.com.au  Fact Sheet for Healthcare Providers: IncredibleEmployment.be  This test is not yet approved or cleared by the [place] FDA and has been authorized for detection and/or diagnosis of SARS-CoV-2 by FDA under an Emergency Use Authorization (EUA). This EUA will remain in effect (meaning this test can be used) for the duration of the COVID-19 declaration under Section 564(b)(1) of the Act, 21 U.S.C. section 360bbb-3(b)(1), unless the authorization is terminated or revoked.  Performed at [facility], [address] [place],  [address]   Blood Culture (routine x 2)     Status: None   Collection Time: 06/18/21  5:31  PM   Specimen: BLOOD  Result Value Ref Range Status   Specimen Description BLOOD RIGHT HAND  Final   Special Requests   Final    BOTTLES DRAWN AEROBIC AND ANAEROBIC Blood Culture adequate volume   Culture   Final  NO GROWTH 5 DAYS Performed at [facility], [address] [place], [place] [address]    Report Status 06/23/2021 FINAL  Final  Blood Culture (routine x 2)     Status: None   Collection Time: 06/18/21  5:31 PM   Specimen: BLOOD  Result Value Ref Range Status   Specimen Description BLOOD RIGHT [facility]  Final   Special Requests   Final    BOTTLES DRAWN AEROBIC AND ANAEROBIC Blood Culture adequate volume   Culture   Final    NO GROWTH 5 DAYS Performed at [name], [address] [place], [place] [address]    Report Status 06/23/2021 FINAL  Final  Urine Culture     Status: None   Collection Time: 06/18/21  5:31 PM   Specimen: In/Out Cath Urine  Result Value Ref Range Status   Specimen Description   Final    IN/OUT CATH URINE Performed at [facility], [address], [place], [place] [address]    Special Requests   Final    NONE Performed at [facility], [address], [place], [place] [address]    Culture   Final    NO GROWTH Performed at [facility], [place] [address], [place],  [address]    Report Status 06/20/2021 FINAL  Final         Radiology Studies: NM Myocar Multi W/Spect W/Wall Motion / EF  Result Date: 06/23/2021   Findings are consistent with ischemia. The study is high risk.   No ST deviation was noted.   LV perfusion is abnormal. There is evidence of ischemia. Defect 1: There is a large defect with severe reduction in uptake present in the apical to mid anterior and anteroseptal location(s) that is reversible. Consistent with ischemia.   Left ventricular function is abnormal. Nuclear stress EF: 47 %. The left ventricular ejection fraction is mildly decreased (45-54%). Conclusion: Abnormal high  risk study There is a large, severe, mostly reversible defect involving the mid to apical anterior wall (LAD distribution) that is consistent with ischemia. Mildly decreased LV systolic function.        Scheduled Meds:  acidophilus  2 capsule Oral TID   aspirin  81 mg Oral Pre-Cath   brimonidine  1 drop Both Eyes BID   Chlorhexidine Gluconate Cloth  6 each Topical Daily   clopidogrel  75 mg Oral Daily   docusate sodium  100 mg Oral BID   doxycycline  100 mg Oral [id number]   folic acid  1 mg Oral Daily   guaiFENesin  600 mg Oral BID   insulin aspart  0-15 Units Subcutaneous TID AC & HS   isosorbide mononitrate  30 mg Oral Daily   latanoprost  1 drop Both Eyes QHS   losartan  50 mg Oral Daily   metoprolol succinate  50 mg Oral BID   multivitamin with minerals  1 tablet Oral Daily   pantoprazole (PROTONIX) IV  40 mg Intravenous QHS   sodium chloride flush  3 mL Intravenous Q12H   thiamine injection  100 mg Intravenous Daily   Continuous Infusions:  sodium chloride     sodium chloride     [START ON 06/25/2021] sodium chloride     Followed by   Derrill Memo ON 06/25/2021] sodium chloride       LOS: 6 days    Time spent: 28 minutes    Sharen Hones, MD [facility]   To contact the attending provider  between 7A-7P or the covering provider during after hours 7P-7A, please log into the web site [email or web address] and access using universal [place] password for that web site. If you do not have the password, please call the hospital operator.  06/24/2021, 12:02 PM

## 2021-06-25 ENCOUNTER — Encounter
Admission: EM | Disposition: A | Discharge status: Other Acute Inpatient Hospital | Payer: Self-pay | Source: Ambulatory Visit | Attending: Internal Medicine

## 2021-06-25 HISTORY — PX: LEFT HEART CATH AND CORS/GRAFTS ANGIOGRAPHY: CATH118250

## 2021-06-25 LAB — MAGNESIUM: Magnesium: 1.9 mg/dL (ref 1.7–2.4)

## 2021-06-25 LAB — BASIC METABOLIC PANEL
Anion gap: 7 (ref 5–15)
BUN: 19 mg/dL (ref 8–23)
CO2: 22 mmol/L (ref 22–32)
Calcium: 9.1 mg/dL (ref 8.9–10.3)
Chloride: 105 mmol/L (ref 98–111)
Creatinine, Ser: 0.87 mg/dL (ref 0.61–1.24)
GFR, Estimated: >60 mL/min (ref >60)
Glucose, Bld: 121 mg/dL — ABNORMAL HIGH (ref 70–99)
Potassium: 3.8 mmol/L (ref 3.5–5.1)
Sodium: 134 mmol/L — ABNORMAL LOW (ref 135–145)

## 2021-06-25 LAB — GLUCOSE, CAPILLARY
Glucose-Capillary: 115 mg/dL — ABNORMAL HIGH (ref 70–99)
Glucose-Capillary: 111 mg/dL — ABNORMAL HIGH (ref 70–99)
Glucose-Capillary: 132 mg/dL — ABNORMAL HIGH (ref 70–99)
Glucose-Capillary: 119 mg/dL — ABNORMAL HIGH (ref 70–99)

## 2021-06-25 LAB — PHOSPHORUS: Phosphorus: 4.2 mg/dL (ref 2.5–4.6)

## 2021-06-25 SURGERY — LEFT HEART CATH AND CORS/GRAFTS ANGIOGRAPHY
Anesthesia: Moderate Sedation

## 2021-06-25 MED ORDER — SODIUM CHLORIDE 0.9 % IV SOLN: 250.0000 mL | INTRAVENOUS | Status: DC | PRN

## 2021-06-25 MED ORDER — HEPARIN SODIUM (PORCINE) 1000 UNIT/ML IJ SOLN
INTRAMUSCULAR | Status: AC
  Filled 2021-06-25: qty 1

## 2021-06-25 MED ORDER — VERAPAMIL HCL 2.5 MG/ML IV SOLN
INTRAVENOUS | Status: AC
  Filled 2021-06-25: qty 2

## 2021-06-25 MED ORDER — HEPARIN (PORCINE) IN NACL 1000-0.9 UT/500ML-% IV SOLN
INTRAVENOUS | Status: DC | PRN
  Administered 2021-06-25: 1000 mL

## 2021-06-25 MED ORDER — LIDOCAINE HCL 1 % IJ SOLN
INTRAMUSCULAR | Status: AC
  Filled 2021-06-25: qty 20

## 2021-06-25 MED ORDER — THIAMINE HCL 100 MG PO TABS
100.0000 mg | ORAL_TABLET | Freq: Every day | ORAL | Status: DC
  Administered 2021-06-25: 100 mg via ORAL
  Filled 2021-06-25: qty 1

## 2021-06-25 MED ORDER — FENTANYL CITRATE PF 50 MCG/ML IJ SOSY
PREFILLED_SYRINGE | INTRAMUSCULAR | Status: AC
  Filled 2021-06-25: qty 1

## 2021-06-25 MED ORDER — SODIUM CHLORIDE 0.9% FLUSH: 3.0000 mL | INTRAVENOUS | Status: DC | PRN

## 2021-06-25 MED ORDER — MIDAZOLAM HCL 2 MG/2ML IJ SOLN
INTRAMUSCULAR | Status: AC
  Filled 2021-06-25: qty 2

## 2021-06-25 MED ORDER — ASPIRIN 81 MG PO CHEW
324.0000 mg | CHEWABLE_TABLET | Freq: Once | ORAL | Status: AC
  Administered 2021-06-25: 324 mg via ORAL
  Filled 2021-06-25: qty 4

## 2021-06-25 MED ORDER — HEPARIN (PORCINE) IN NACL 1000-0.9 UT/500ML-% IV SOLN
INTRAVENOUS | Status: AC
  Filled 2021-06-25: qty 1000

## 2021-06-25 MED ORDER — SODIUM CHLORIDE 0.9% FLUSH: 3.0000 mL | Freq: Two times a day (BID) | INTRAVENOUS | Status: DC

## 2021-06-25 MED ORDER — MIDAZOLAM HCL 2 MG/2ML IJ SOLN
INTRAMUSCULAR | Status: DC | PRN
  Administered 2021-06-25 (×2): 1 mg via INTRAVENOUS

## 2021-06-25 MED ORDER — LIDOCAINE HCL (PF) 1 % IJ SOLN
INTRAMUSCULAR | Status: DC | PRN
  Administered 2021-06-25: 2 mL

## 2021-06-25 MED ORDER — HEPARIN SODIUM (PORCINE) 1000 UNIT/ML IJ SOLN
INTRAMUSCULAR | Status: DC | PRN
  Administered 2021-06-25: 5000 [IU] via INTRAVENOUS

## 2021-06-25 MED ORDER — VERAPAMIL HCL 2.5 MG/ML IV SOLN
INTRAVENOUS | Status: DC | PRN
  Administered 2021-06-25: 2.5 mg via INTRA_ARTERIAL

## 2021-06-25 MED ORDER — FENTANYL CITRATE (PF) 100 MCG/2ML IJ SOLN
INTRAMUSCULAR | Status: DC | PRN
  Administered 2021-06-25 (×2): 25 ug via INTRAVENOUS

## 2021-06-25 MED ORDER — HEPARIN (PORCINE) 25000 UT/250ML-% IV SOLN
1050.0000 [IU]/h | INTRAVENOUS | Status: DC
Start: 2021-06-25 — End: 2021-06-26
  Administered 2021-06-25: 1050 [IU]/h via INTRAVENOUS
  Filled 2021-06-25: qty 250

## 2021-06-25 MED ORDER — SODIUM CHLORIDE 0.9 % IV SOLN: INTRAVENOUS | Status: AC

## 2021-06-25 MED ORDER — IOHEXOL 350 MG/ML SOLN
INTRAVENOUS | Status: DC | PRN
  Administered 2021-06-25: 119 mL

## 2021-06-25 SURGICAL SUPPLY — 17 items
CATH INFINITI 5 FR IM (CATHETERS) ×2 IMPLANT
CATH INFINITI 5 FR JL3.5 (CATHETERS) ×2 IMPLANT
CATH INFINITI 5 FR LCB (CATHETERS) ×2 IMPLANT
CATH INFINITI 5FR AL1 (CATHETERS) ×2 IMPLANT
CATH INFINITI 5FR JL4 (CATHETERS) ×2 IMPLANT
CATH INFINITI JR4 5F (CATHETERS) ×2 IMPLANT
DEVICE RAD TR BAND REGULAR (VASCULAR PRODUCTS) ×2 IMPLANT
DRAPE BRACHIAL (DRAPES) ×2 IMPLANT
GLIDESHEATH SLEND SS 6F .021 (SHEATH) ×2 IMPLANT
GUIDEWIRE INQWIRE 1.5J.035X260 (WIRE) ×1 IMPLANT
INQWIRE 1.5J .035X260CM (WIRE) ×2
PACK CARDIAC CATH (CUSTOM PROCEDURE TRAY) ×2 IMPLANT
PAD ELECT DEFIB RADIOL ZOLL (MISCELLANEOUS) ×2 IMPLANT
PROTECTION STATION PRESSURIZED (MISCELLANEOUS) ×2
SET ATX SIMPLICITY (MISCELLANEOUS) ×2 IMPLANT
STATION PROTECTION PRESSURIZED (MISCELLANEOUS) ×1 IMPLANT
TUBING CIL FLEX 10 FLL-RA (TUBING) ×2 IMPLANT

## 2021-06-25 NOTE — Progress Notes (Signed)
[facility] Encounter Note  Patient: Damon English / Admit Date: 06/18/2021 / Date of Encounter: 06/25/2021, 3:35 PM   Subjective: 9/5 patient is done very well overnight with no evidence of significant chest pain shortness of breath or other significant symptoms.  Prior to this event the patient had no evidence of worsening angina, in fact was improving with his medical management and anginal symptoms.  Cardiac arrest was in the setting potassium of 2.2 and possible sepsis.  Troponin peak was 1533 possibly consistent with CPR.  Echocardiogram showing low normal LV systolic function ejection fraction of 50%.  Patient has tolerated appropriate medication management with no evidence of heart failure or angina since admission  9/6 no changes from yesterday with no evidence of rhythm disturbances or other significant chest discomfort or concerns.  Patient does complain of shortness of breath with physical activity multifactorial in nature and unchanged from before.  Patient had caffeine use therefore cannot perform stress test to evaluate and/or risk stratify for discharged home  9/7.  Patient has done overall well overnight with no evidence of chest discomfort or evidence of rhythm disturbances.  Telemetry has shown no evidence of any rhythm disturbances that could have caused any syncope.  The patient was admitted for an apparent cardiac arrest although had severe hypokalemia at potassium of 2.2.  Peak troponin was 1545 consistent with either demand ischemia non-ST elevation myocardial infarction and or CPR with or without defibrillation.  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  The patient also had occlusion of saphenous vein graft to obtuse marginal which has been medically managed but no evidence of ischemia in  that distribution at this time.  Therefore, will discuss with interventional team to assess for the possibility of cardiac catheterization to assess need for further intervention of LIMA to LAD anastomosis  9/8.  Patient is done very well overnight with no evidence of chest pain or significant shortness of breath or rhythm disturbances.  Patient has normal glomerular filtration rate and normal potassium and magnesium at this time.  There has been a little bit rehabilitation with no evidence of anginal symptoms.  Patient does have abdominal bloating that is worrisome to the patient and the family.  This may suggest some fluid retention quiring reintroduction of Lasix.  9/9.  Patient overall has done well since hospitalization with no evidence of ventricular tachycardia or concerns for rhythm disturbances.  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  50% circumflex artery stenosis which will be medically managed and graft to PDA is patent discussion with Dr. Corky Sox and other interventional cardiologist.  The need for PCI and stent placement anastomosis of distal LIMA to LAD at tertiary center and will transfer  Review of Systems: Positive for: None Negative for: Vision change, hearing change, syncope, dizziness, nausea, vomiting,diarrhea, bloody stool, stomach pain, cough, congestion, diaphoresis, urinary frequency, urinary pain,skin lesions, skin rashes Others previously listed  Objective: Telemetry: Normal sinus rhythm Physical Exam: Blood pressure 139/63, pulse 72, temperature 98.1 F (36.7 C), temperature source Oral, resp. rate 19, height '5\' 5"'$  (1.651 m), weight 99.4 kg, SpO2 99 %. Body mass index is 36.47 kg/m. General: Well developed, well nourished, in no acute  distress. Head: Normocephalic,  atraumatic, sclera non-icteric, no xanthomas, nares are without discharge. Neck: No apparent masses Lungs: Normal respirations with no wheezes, no rhonchi, no rales , no crackles   Heart: Regular rate and rhythm, normal S1 S2, no murmur, no rub, no gallop, PMI is normal size and placement, carotid upstroke normal without bruit, jugular venous pressure normal Abdomen: Soft, non-tender, non-distended with normoactive bowel sounds. No hepatosplenomegaly. Abdominal aorta is normal size without bruit Extremities: No edema, no clubbing, no cyanosis, no ulcers,  Peripheral: 2+ radial, 2+ femoral, 2+ dorsal pedal pulses Neuro: Alert and oriented. Moves all extremities spontaneously. Psych:  Responds to questions appropriately with a normal affect.   Intake/Output Summary (Last 24 hours) at 06/25/2021 1535 Last data filed at 06/25/2021 1058 Gross per 24 hour  Intake 243 ml  Output 1200 ml  Net -957 ml     Inpatient Medications:   [MAR Hold] acidophilus  2 capsule Oral TID   [MAR Hold] brimonidine  1 drop Both Eyes BID   [MAR Hold] Chlorhexidine Gluconate Cloth  6 each Topical Daily   [MAR Hold] clopidogrel  75 mg Oral Daily   [MAR Hold] docusate sodium  100 mg Oral BID   [MAR Hold] doxycycline  100 mg Oral Q000111Q   [MAR Hold] folic acid  1 mg Oral Daily   [MAR Hold] guaiFENesin  600 mg Oral BID   [MAR Hold] insulin aspart  0-15 Units Subcutaneous TID AC & HS   [MAR Hold] isosorbide mononitrate  30 mg Oral Daily   [MAR Hold] latanoprost  1 drop Both Eyes QHS   [MAR Hold] losartan  50 mg Oral Daily   [MAR Hold] metoprolol succinate  50 mg Oral BID   [MAR Hold] multivitamin with minerals  1 tablet Oral Daily   [MAR Hold] pantoprazole (PROTONIX) IV  40 mg Intravenous QHS   [MAR Hold] sodium chloride flush  3 mL Intravenous Q12H   sodium chloride flush  3 mL Intravenous Q12H   [MAR Hold] thiamine  100 mg Oral Daily   Infusions:   sodium chloride     sodium chloride     sodium chloride      heparin      Labs: Recent Labs    06/23/21 0240 06/24/21 0422 06/25/21 0718  NA 136 137 134*  K 3.4* 4.1 3.8  CL 109 106 105  CO2 21* 23 22  GLUCOSE 101* 114* 121*  BUN '15 16 19  '$ CREATININE 0.91 0.91 0.87  CALCIUM 8.5* 8.8* 9.1  MG 1.9 2.3 1.9  PHOS 3.1  --  4.2    Recent Labs    06/23/21 0240  AST 21  ALT 19  ALKPHOS 44  BILITOT 0.9  PROT 6.1*  ALBUMIN 2.7*    Recent Labs    06/23/21 0240  WBC 6.0  HGB 10.8*  HCT 32.4*  MCV 95.3  PLT 163    No results for input(s): CKTOTAL, CKMB, TROPONINI in the last 72 hours. Invalid input(s): POCBNP No results for input(s): HGBA1C in the last 72 hours.    Weights: Filed Weights   06/24/21 0500 06/25/21 0442 06/25/21 1208  Weight: 96.4 kg 99.4 kg 99.4 kg     Radiology/Studies:  DG Abdomen 1 View  Result Date: 06/18/2021 CLINICAL DATA:  Orogastric tube placement EXAM: ABDOMEN - 1 VIEW COMPARISON:  6:26 p.m. FINDINGS: Orogastric tube tip now overlies the gastric fundus. Visualized lung bases are clear. No free intraperitoneal gas. The abdominal gas pattern is  indeterminate due to a paucity of intra-abdominal gas. Pelvis excluded from view. IMPRESSION: Orogastric tube tip within the gastric fundus. Electronically Signed   By: Fidela Salisbury M.D.   On: 06/18/2021 21:15   DG Abd 1 View  Result Date: 06/18/2021 CLINICAL DATA:  Encounter for NG tube placement EXAM: ABDOMEN - 1 VIEW COMPARISON:  None. FINDINGS: Defibrillator pads overlie the upper abdomen. There is no evidence of bowel obstruction. There is no visible NG tube. The NG tube appears to be overlying the midesophagus on separately dictated chest radiograph. Prior L3-L5 fusion. No acute osseous abnormality. Bilateral iliac stents IMPRESSION: No visible nasogastric tube overlying the abdomen. See separately dictated chest radiograph. Electronically Signed   By: Maurine Simmering M.D.   On: 06/18/2021 18:53   CT HEAD WO CONTRAST (5MM)  Result Date: 06/18/2021 CLINICAL DATA:   Mental status change.  Patient intubated EXAM: CT HEAD WITHOUT CONTRAST TECHNIQUE: Contiguous axial images were obtained from the base of the skull through the vertex without intravenous contrast. COMPARISON:  None. FINDINGS: Brain: Hypodensity left parietal lobe involving cortex and white matter compatible with chronic infarct. Patchy white matter hypodensity bilaterally likely chronic. Negative for hydrocephalus. Negative for acute infarct, hemorrhage or mass. 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 Electronically Signed   By: Franchot Gallo M.D.   On: 06/18/2021 16:20   CT Angio Chest PE W and/or Wo Contrast  Addendum Date: 06/19/2021   ADDENDUM REPORT: 06/19/2021 10:11 ADDENDUM: Upon further review, there is a mildly displaced acute fracture of the right 6th rib anteriorly (image 64/6). In addition, there are possible nondisplaced fractures the right anterior 4th, 5th and 7th ribs. There is also a probable fracture of the left 6th rib anteriorly. No pneumothorax or pleural effusion. These results were discussed by telephone at the time of interpretation on 06/19/2021 at 09:55 am with provider Derrill Kay, who verbally acknowledged these results. Electronically Signed   By: Richardean Sale M.D.   On: 06/19/2021 10:11   Result Date: 06/19/2021 CLINICAL DATA:  PE suspected, high probability.  Intubated patient. EXAM: CT ANGIOGRAPHY CHEST WITH CONTRAST TECHNIQUE: Multidetector CT imaging of the chest was performed using the standard protocol during bolus administration of intravenous contrast. Multiplanar CT image reconstructions and MIPs were obtained to evaluate the vascular anatomy. CONTRAST:  37m OMNIPAQUE IOHEXOL 350 MG/ML SOLN COMPARISON:  Radiographs 01/28/2019. FINDINGS: Cardiovascular: The pulmonary arteries are well  opacified with contrast to the level of the subsegmental branches. There is no evidence of acute pulmonary embolism. There is essentially no contrast opacification of the systemic arterial system. Patient is status post median sternotomy and CABG. There is diffuse atherosclerosis of the aorta, great vessels and coronary arteries. There is some reflux of contrast into the IVC and hepatic veins. The heart size is normal. There is no pericardial effusion. Mediastinum/Nodes: Tip of the endotracheal tube is in the mid trachea. There are no enlarged mediastinal, hilar or axillary lymph nodes. The thyroid gland, trachea and esophagus demonstrate no significant findings. Lungs/Pleura: There is no pleural effusion. Mild centrilobular emphysema with dependent pulmonary opacities bilaterally, favoring atelectasis. No consolidation or suspicious pulmonary nodularity. Triangular-shaped 4 mm right middle lobe nodule on image 47/6, likely benign. Upper abdomen: As above, there is some reflux of contrast into the IVC and hepatic veins. The liver contours are mildly irregular. The gallbladder is surgically absent. Musculoskeletal/Chest wall:  There is no chest wall mass or suspicious osseous finding. Previous median sternotomy. Multilevel thoracic spondylosis. Review of the MIP images confirms the above findings. IMPRESSION: 1. No evidence of acute pulmonary embolism or other acute chest process. 2. Diffuse coronary and Aortic Atherosclerosis (ICD10-I70.0). Previous CABG. 3. Mild emphysema with probable mild dependent atelectasis in both lungs. 4. Potential changes of early cirrhosis. Electronically Signed: By: Richardean Sale M.D. On: 06/18/2021 16:20   CT FEMUR RIGHT WO CONTRAST  Result Date: 06/18/2021 CLINICAL DATA:  Recent insect bite with fever and right lower extremity pain and edema. EXAM: CT OF THE RIGHT FEMUR, LOWER LEG AND FOOT WITHOUT CONTRAST TECHNIQUE: Multidetector CT imaging of the right lower extremity was  performed according to the standard protocol. The images extend from the lower right pelvis through the right foot. Multiplanar CT image reconstructions were also generated. COMPARISON:  None. FINDINGS: Bones/Joint/Cartilage No evidence of acute fracture or dislocation. Status post proximal right femoral dynamic plate and screw fixation of an intertrochanteric femur fracture. The hardware is intact without loosening. There is irregular periosteal ossification anteriorly, likely chronic and related to the original injury. The distal femur, tibia, fibula and foot demonstrate no significant findings. No large joint effusions are seen. Ligaments Suboptimally assessed by CT. Muscles and Tendons No focal muscular atrophy or fluid collection identified. The extensor mechanism is intact at the knee. The ankle tendons appear intact and normally located. Soft tissues Bilateral external iliac stents are noted with postsurgical changes from right femoropopliteal bypass grafting. There is extensive atherosclerosis of the underlying native arteries in the right lower extremity. Foley catheter is in place with mild bladder wall thickening. There is contrast in the bladder related to the preceding chest CTA. Postsurgical changes are present in the subcutaneous fat lateral to the proximal right femur, attributed to the previous ORIF. No inflammatory changes or fluid collections are seen within the thigh. There is diffuse subcutaneous edema within the lower leg, greatest distally. No focal fluid collection, soft tissue emphysema or foreign body identified. This edema extends into the foot. IMPRESSION: 1. Diffuse subcutaneous edema within the lower leg and foot consistent with soft tissue infection (cellulitis). No focal fluid collection or unexpected foreign body identified. 2. No evidence of deep soft tissue infection, septic arthritis or osteomyelitis. 3. Previous right proximal femoral ORIF without acute osseous findings. 4.  Extensive atherosclerosis post right femoropopliteal bypass grafting. Electronically Signed   By: Richardean Sale M.D.   On: 06/18/2021 20:52   CT TIBIA FIBULA RIGHT WO CONTRAST  Result Date: 06/18/2021 CLINICAL DATA:  Recent insect bite with fever and right lower extremity pain and edema. EXAM: CT OF THE RIGHT FEMUR, LOWER LEG AND FOOT WITHOUT CONTRAST TECHNIQUE: Multidetector CT imaging of the right lower extremity was performed according to the standard protocol. The images extend from the lower right pelvis through the right foot. Multiplanar CT image reconstructions were also generated. COMPARISON:  None. FINDINGS: Bones/Joint/Cartilage No evidence of acute fracture or dislocation. Status post proximal right femoral dynamic plate and screw fixation of an intertrochanteric femur fracture. The hardware is intact without loosening. There is irregular periosteal ossification anteriorly, likely chronic and related to the original injury. The distal femur, tibia, fibula and foot demonstrate no significant findings. No large joint effusions are seen. Ligaments Suboptimally assessed by CT. Muscles and Tendons No focal muscular atrophy or fluid collection identified. The extensor mechanism is intact at the knee. The ankle tendons appear intact and normally located. Soft tissues Bilateral external iliac  stents are noted with postsurgical changes from right femoropopliteal bypass grafting. There is extensive atherosclerosis of the underlying native arteries in the right lower extremity. Foley catheter is in place with mild bladder wall thickening. There is contrast in the bladder related to the preceding chest CTA. Postsurgical changes are present in the subcutaneous fat lateral to the proximal right femur, attributed to the previous ORIF. No inflammatory changes or fluid collections are seen within the thigh. There is diffuse subcutaneous edema within the lower leg, greatest distally. No focal fluid collection, soft  tissue emphysema or foreign body identified. This edema extends into the foot. IMPRESSION: 1. Diffuse subcutaneous edema within the lower leg and foot consistent with soft tissue infection (cellulitis). No focal fluid collection or unexpected foreign body identified. 2. No evidence of deep soft tissue infection, septic arthritis or osteomyelitis. 3. Previous right proximal femoral ORIF without acute osseous findings. 4. Extensive atherosclerosis post right femoropopliteal bypass grafting. Electronically Signed   By: Richardean Sale M.D.   On: 06/18/2021 20:52   CT FOOT RIGHT WO CONTRAST  Result Date: 06/18/2021 CLINICAL DATA:  Recent insect bite with fever and right lower extremity pain and edema. EXAM: CT OF THE RIGHT FEMUR, LOWER LEG AND FOOT WITHOUT CONTRAST TECHNIQUE: Multidetector CT imaging of the right lower extremity was performed according to the standard protocol. The images extend from the lower right pelvis through the right foot. Multiplanar CT image reconstructions were also generated. COMPARISON:  None. FINDINGS: Bones/Joint/Cartilage No evidence of acute fracture or dislocation. Status post proximal right femoral dynamic plate and screw fixation of an intertrochanteric femur fracture. The hardware is intact without loosening. There is irregular periosteal ossification anteriorly, likely chronic and related to the original injury. The distal femur, tibia, fibula and foot demonstrate no significant findings. No large joint effusions are seen. Ligaments Suboptimally assessed by CT. Muscles and Tendons No focal muscular atrophy or fluid collection identified. The extensor mechanism is intact at the knee. The ankle tendons appear intact and normally located. Soft tissues Bilateral external iliac stents are noted with postsurgical changes from right femoropopliteal bypass grafting. There is extensive atherosclerosis of the underlying native arteries in the right lower extremity. Foley catheter is in  place with mild bladder wall thickening. There is contrast in the bladder related to the preceding chest CTA. Postsurgical changes are present in the subcutaneous fat lateral to the proximal right femur, attributed to the previous ORIF. No inflammatory changes or fluid collections are seen within the thigh. There is diffuse subcutaneous edema within the lower leg, greatest distally. No focal fluid collection, soft tissue emphysema or foreign body identified. This edema extends into the foot. IMPRESSION: 1. Diffuse subcutaneous edema within the lower leg and foot consistent with soft tissue infection (cellulitis). No focal fluid collection or unexpected foreign body identified. 2. No evidence of deep soft tissue infection, septic arthritis or osteomyelitis. 3. Previous right proximal femoral ORIF without acute osseous findings. 4. Extensive atherosclerosis post right femoropopliteal bypass grafting. Electronically Signed   By: Richardean Sale M.D.   On: 06/18/2021 20:52   NM Myocar Multi W/Spect Tamela Oddi Motion / EF  Result Date: 06/24/2021   Findings are consistent with ischemia. The study is high risk.   No ST deviation was noted.   LV perfusion is abnormal. There is evidence of ischemia. Defect 1: There is a large defect with severe reduction in uptake present in the apical to mid anterior and anteroseptal location(s) that is reversible. Consistent with ischemia.  Left ventricular function is abnormal. Nuclear stress EF: 47 %. The left ventricular ejection fraction is mildly decreased (45-54%). Conclusion: Abnormal high risk study There is a large, severe, mostly reversible defect involving the mid to apical anterior wall (LAD distribution) that is consistent with ischemia. Mildly decreased LV systolic function.   DG Chest Port 1 View  Result Date: 06/21/2021 CLINICAL DATA:  Cough. EXAM: PORTABLE CHEST 1 VIEW COMPARISON:  Heart is enlarged. Atherosclerotic changes are present at the aortic arch. Lung volumes  are low. No edema or effusion is present. No focal airspace disease is present. FINDINGS: Heart is enlarged. Atherosclerotic changes are present at the aortic arch. Lung volumes are low. No edema or effusion is present. No focal airspace disease is present. IMPRESSION: 1. Cardiomegaly without failure. 2. Low lung volumes. Electronically Signed   By: San Morelle M.D.   On: 06/21/2021 16:09   DG Chest Port 1 View  Result Date: 06/18/2021 CLINICAL DATA:  Encounter for ET tube and OG tube placement EXAM: PORTABLE CHEST 1 VIEW COMPARISON:  Same day CT chest FINDINGS: Endotracheal tube overlies the midthoracic trachea. There is an orogastric tube with tip overlying the mid esophagus. Fibular pads overlie the left lower chest. Enlarged cardiac silhouette. Bibasilar subsegmental atelectasis. There is no focal airspace consolidation. There is no large pleural effusion or visible pneumothorax. There is no acute osseous abnormality. IMPRESSION: Endotracheal tube tip overlies the midthoracic trachea. Orogastric tube tip overlies the mid esophagus, recommend advancement. Electronically Signed   By: Maurine Simmering M.D.   On: 06/18/2021 18:56   VAS [place] ABI WITH/WO TBI  Result Date: 06/22/2021  LOWER EXTREMITY DOPPLER STUDY Patient Name:  LABRYANT CHALIFOUX  Date of Exam:   06/18/2021 Medical Rec #: [id number]         Accession #:    [id number] Date of Birth: 20-Aug-1951          Patient Gender: M Patient Age:   77 years Exam Location:  [facility] Procedure:      VAS [place] ABI WITH/WO TBI Referring Phys: Corene Cornea DEW --------------------------------------------------------------------------------  Indications: Peripheral artery disease.  Vascular Interventions: 01/14/2021 Bilateral CIA and EIA stents. Comparison Study: 01/2021 Performing Technologist: Concha Norway RVT  Examination Guidelines: A complete evaluation includes at minimum, Doppler waveform signals and systolic blood pressure reading at the level of bilateral  brachial, anterior tibial, and posterior tibial arteries, when vessel segments are accessible. Bilateral testing is considered an integral part of a complete examination. Photoelectric Plethysmograph (PPG) waveforms and toe systolic pressure readings are included as required and additional duplex testing as needed. Limited examinations for reoccurring indications may be performed as noted.  ABI Findings: +---------+------------------+-----+--------+--------+ Right    Rt Pressure (mmHg)IndexWaveformComment  +---------+------------------+-----+--------+--------+ Brachial 141                                     +---------+------------------+-----+--------+--------+ ATA      160                    biphasic1.13     +---------+------------------+-----+--------+--------+ PTA      181               1.28 biphasic         +---------+------------------+-----+--------+--------+ Great Toe112               0.79 Normal           +---------+------------------+-----+--------+--------+ +---------+------------------+-----+----------+-------+  Left     Lt Pressure (mmHg)IndexWaveform  Comment +---------+------------------+-----+----------+-------+ ATA      112                    monophasic.79     +---------+------------------+-----+----------+-------+ PTA      107               0.76 monophasic        +---------+------------------+-----+----------+-------+ Great Toe53                0.38 Abnormal          +---------+------------------+-----+----------+-------+ +-------+-----------+-----------+------------+------------+ ABI/TBIToday's ABIToday's TBIPrevious ABIPrevious TBI +-------+-----------+-----------+------------+------------+ Right  1.28       .79        1.03        .74          +-------+-----------+-----------+------------+------------+ Left   .79        .39        .62         .23          +-------+-----------+-----------+------------+------------+  Left ABIs  and TBIs appear increased compared to prior study on 01/2021.  Summary: Right: Resting right ankle-brachial index is within normal range. No evidence of significant right lower extremity arterial disease. The right toe-brachial index is normal. Duplex added EIA patent. Ltd look at calf veins with normal compression. Left: Duplex added EIA and stent, CFA patent.  *See table(s) above for measurements and observations.  Electronically signed by [name] MD on 06/22/2021 at 11:15:03 AM.    Final    ECHOCARDIOGRAM COMPLETE  Result Date: 06/19/2021    ECHOCARDIOGRAM REPORT   Patient Name:   NAME SEESE Date of Exam: 06/19/2021 Medical Rec #:  [id number]        Height:       65.0 in Accession #:    [id number]       Weight:       214.3 lb Date of Birth:  02-Mar-1951         BSA:          2.037 m Patient Age:    68 years         BP:           124/45 mmHg Patient Gender: M                HR:           65 bpm. Exam Location:  [facility] Procedure: 2D Echo and Intracardiac Opacification Agent Indications:     Cardiac arrest I46.9  History:         Patient has prior history of Echocardiogram examinations, most                  recent 01/28/2019.  Sonographer:     Kathlen Brunswick RDCS Referring Phys:  [id number] Harney E GRAVES Diagnosing Phys: Kate Sable MD  Sonographer Comments: Technically challenging study due to limited acoustic windows and echo performed with patient supine and on artificial respirator. IMPRESSIONS  1. Left ventricular ejection fraction, by estimation, is 50%. The left ventricle has low normal function. The left ventricle has no regional wall motion abnormalities. Left ventricular diastolic function could not be evaluated.  2. Right ventricular systolic function is low normal. The right ventricular size is not well visualized.  3. The mitral valve is normal in structure. No evidence of mitral valve regurgitation.  4. The aortic valve is grossly normal. Aortic valve regurgitation is not  visualized. FINDINGS   Left Ventricle: Left ventricular ejection fraction, by estimation, is 50%. The left ventricle has low normal function. The left ventricle has no regional wall motion abnormalities. Definity contrast agent was given IV to delineate the left ventricular endocardial borders. The left ventricular internal cavity size was normal in size. There is no left ventricular hypertrophy. Left ventricular diastolic function could not be evaluated. Right Ventricle: The right ventricular size is not well visualized. No increase in right ventricular wall thickness. Right ventricular systolic function is low normal. Left Atrium: Left atrial size was normal in size. Right Atrium: Right atrial size was not well visualized. Pericardium: There is no evidence of pericardial effusion. Mitral Valve: The mitral valve is normal in structure. No evidence of mitral valve regurgitation. Tricuspid Valve: The tricuspid valve is not well visualized. Tricuspid valve regurgitation is not demonstrated. Aortic Valve: The aortic valve is grossly normal. Aortic valve regurgitation is not visualized. Pulmonic Valve: The pulmonic valve was not well visualized. Pulmonic valve regurgitation is not visualized. Aorta: The aortic root is normal in size and structure. Venous: IVC assessment for right atrial pressure unable to be performed due to mechanical ventilation. IAS/Shunts: The interatrial septum was not well visualized.  LEFT VENTRICLE PLAX 2D LVIDd:         5.40 cm LVIDs:         4.20 cm LV PW:         1.10 cm LV IVS:        1.00 cm LVOT diam:     2.20 cm LVOT Area:     3.80 cm  LEFT ATRIUM         Index LA diam:    3.70 cm 1.82 cm/m   AORTA Ao Root diam: 3.40 cm Ao Asc diam:  3.40 cm  SHUNTS Systemic Diam: 2.20 cm Kate Sable MD Electronically signed by Kate Sable MD Signature Date/Time: 06/19/2021/3:43:59 PM    Final    VAS [place] LOWER EXTREMITY ARTERIAL DUPLEX  Result Date: 06/22/2021 LOWER EXTREMITY ARTERIAL DUPLEX STUDY Patient Name:   MICAJAH OAS  Date of Exam:   06/18/2021 Medical Rec #: [id number]         Accession #:    [id number] Date of Birth: 07-27-1951          Patient Gender: M Patient Age:   50 years Exam Location:  [facility] Procedure:      VAS [place] LOWER EXTREMITY ARTERIAL DUPLEX Referring Phys: [name] --------------------------------------------------------------------------------  Indications: Peripheral artery disease.  Vascular Interventions: Bilat CIA EIA STENTS. Current ABI:            r- 1.28 ; l - .79 Performing Technologist: Concha Norway RVT  Examination Guidelines: A complete evaluation includes B-mode imaging, spectral Doppler, color Doppler, and power Doppler as needed of all accessible portions of each vessel. Bilateral testing is considered an integral part of a complete examination. Limited examinations for reoccurring indications may be performed as noted.  +----------+--------+-----+--------+--------+--------+ RIGHT     PSV cm/sRatioStenosisWaveformComments +----------+--------+-----+--------+--------+--------+ EIA Prox  256                  biphasic         +----------+--------+-----+--------+--------+--------+ EIA Mid   194                  biphasic         +----------+--------+-----+--------+--------+--------+ EIA [id number]  biphasic         +----------+--------+-----+--------+--------+--------+ CFA Mid   73                                    +----------+--------+-----+--------+--------+--------+ POP Mid   81                   biphasic         +----------+--------+-----+--------+--------+--------+ PTA Distal84                   biphasic         +----------+--------+-----+--------+--------+--------+  +----------+--------+-----+--------+----------+--------+ LEFT      PSV cm/sRatioStenosisWaveform  Comments +----------+--------+-----+--------+----------+--------+ EIA Prox  239                  biphasic            +----------+--------+-----+--------+----------+--------+ EIA Mid   230                  biphasic           +----------+--------+-----+--------+----------+--------+ EIA Distal131                  biphasic           +----------+--------+-----+--------+----------+--------+ CFA Mid   148                  biphasic           +----------+--------+-----+--------+----------+--------+ SFA Mid   92                   monophasic         +----------+--------+-----+--------+----------+--------+ POP Mid   38                                      +----------+--------+-----+--------+----------+--------+ POP Distal                     monophasic         +----------+--------+-----+--------+----------+--------+ PTA Distal45                   monophasic         +----------+--------+-----+--------+----------+--------+  Summary: Right: Moderate atherosclerosis. Biphasic flow in EIA CFA showimg evidence of patent iliac stents. Left: Moderate atherosclerosis. Biphasic flow in EIA CFA showimg evidence of patent iliac stents.  See table(s) above for measurements and observations. Electronically signed by [name] MD on 06/22/2021 at 9:14:38 AM.    Final    US Abdomen Limited RUQ (LIVER/GB)  Result Date: 06/18/2021 CLINICAL DATA:  Elevated liver enzymes EXAM: ULTRASOUND ABDOMEN LIMITED RIGHT UPPER QUADRANT COMPARISON:  None. FINDINGS: Gallbladder: Absent Common bile duct: Diameter: 5 mm in proximal diameter Liver: The hepatic parenchymal echogenicity is diffusely increased, the echotexture is mildly coarsened, and there is poor acoustic through transmission most in keeping with changes of moderate hepatic steatosis. No focal intrahepatic masses are identified. There is no intrahepatic biliary ductal dilation. Portal vein is patent on color Doppler imaging with normal direction of blood flow towards the liver. Other: No ascites IMPRESSION: Status post cholecystectomy. Moderate hepatic steatosis.  Electronically Signed   By: Fidela Salisbury M.D.   On: 06/18/2021 20:03     Assessment and Recommendation  70 y.o. male with known previous myocardial infarction having right leg infection dehydration hypokalemia and apparent cardiac  arrest with defibrillation but no evidence of worsening angina and elevation of troponin consistent with current issues and no apparent acute coronary syndrome on appropriate medication management 1.  Continue medication management for further risk reduction cardiovascular event with addition of intravenous dosage of 40 mg of Lasix for abdominal bloating and shortness of breath to continue to reduce possibilities of combined systolic diastolic dysfunction congestive heart failure 2.   Transfer to tertiary center for further PCI and stent placement of distal anastomosis of LIMA to LAD causing significant symptoms and non-ST elevation myocardial infarction and abnormal stress test.  Signed, Serafina Royals M.D. FACC

## 2021-06-25 NOTE — Consult Note (Signed)
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  Is on NOAC for now.  No ASA with it. Not allergic per patient.     Patient Measurements: Height: '5\' 5"'$  (165.1 cm) Weight: 99.4 kg (219 lb 2.2 oz) IBW/kg (Calculated) : 61.5 Heparin Dosing Weight: 83.7 kg   Vital Signs: Temp: 98.1 F (36.7 C) (09/09 1208) Temp Source: Oral (09/09 1208) BP: 134/66 (09/09 1415) Pulse Rate: 69 (09/09 1415)  Labs: Recent Labs    06/23/21 0240 06/24/21 0422 06/25/21 0718  HGB 10.8*  --   --   HCT 32.4*  --   --   PLT 163  --   --   CREATININE 0.91 0.91 0.87    Estimated Creatinine Clearance: 88.2 mL/min (by C-G formula based on SCr of 0.87 mg/dL).   Medical History: Past Medical History:  Diagnosis Date   Alcohol use    "cutting back" but still heavy and daily, multiple shots of bourbon each night   Diabetes mellitus without complication ([place])    History of tobacco use    reportedly quit in 2013   Hypertension    Pulmonary embolism ([place]) 2012   unprovoked   PVD (peripheral vascular disease) ([place])     Medications:  Apixaban 5 mg BID PTA -- last dose 06/23/21 @ 1200 -- held for cath procedure Heparin SQ: 6,000 units given 06/25/21 @ 1300 during cath procedure   Assessment: 70 yo male presented [facility] ER on 09/2 following a cardiac arrest. S/p initial medical management. 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 Last dose >48 hours ago -- held for planned PCI. Pharmacy has been  consulted to transition to heparin infusion following catheter procedure.   Goal of Therapy:  Heparin level 0.3-0.7 units/ml Monitor platelets by anticoagulation protocol: Yes   Plan:  Start heparin infusion at 1050 units/hr 4 hours following sheath removal at 1800  Check anti-Xa and aPTT level in 6 hours and daily while on heparin Will bypass initial HL due to last DOAC dose > 48 hours and will obtain aPTT/HL for first 6 hours assessment to see if correlating. If not correlating, will transition to aPTT monitor until correlation.  Continue to monitor H&H and platelets  Dorothe Pea, PharmD, BCPS Clinical Pharmacist   06/25/2021,2:20 PM

## 2021-06-25 NOTE — Progress Notes (Signed)
PROGRESS NOTE    Damon English  [id number] DOB: 25-Jun-1951 DOA: 06/18/2021 PCP: [name], PA-C   Follow-up post cardiac arrest. Brief Narrative:   Patient is a 70 year old male with history of alcohol abuse, chronic systolic congestive heart failure, coronary artery disease, type 2 diabetes, who presents to the hospital after cardiac arrest.  He received defibrillation by EMS prior to arrival in the hospital.  She was intubated after arriving the emergency room. Patient also had a potassium of 2.2, which is presumed to be the cardiac arrest.  CT angiogram of the chest did not show PE. Echocardiogram showed ejection fraction 50%.  No regional abnormality.  Assessment & Plan:   Active Problems:   NSTEMI (non-ST elevated myocardial infarction) (HCC)   Chronic systolic CHF (congestive heart failure) (HCC)   Ischemic cardiomyopathy   Type 2 diabetes mellitus (HCC)   Cardiac arrest (HCC)   ETOH abuse   Acute respiratory failure with hypoxia and hypercapnia (HCC)   On mechanically assisted ventilation (HCC)   Cellulitis of leg, right   Sepsis ([place])   Acute kidney injury (AKI) with acute tubular necrosis (ATN) (HCC)   Acute encephalopathy   Hypokalemia   Transaminitis   Pressure injury of skin  #1.  Cardiac arrest with ventricular fibrillation secondary to severe hypokalemia. Elevated troponin secondary to cardiac arrest. Complete AV block, resolved. Chronic systolic congestive heart failure.  Orthostatic hypotension. Coronary artery disease status post CABG. Condition had improved, no additional chest pain or shortness of breath.  Pending heart cath today.  2.  Acute kidney injury secondary to ATN. Hypokalemia. Condition has resolved.  3.  Sepsis secondary to right lower extremity cellulitis. Right lower extremity cellulitis.   Recent tick bites. Due to altered mental status, patient will be complete 2 weeks of doxycycline.  4.  Acute metabolic  encephalopathy. Alcohol use disorder. Condition stable, mental status has improved.  5.  History of DVT. Eliquis on hold due to heart cath.  6.  Liver cirrhosis P Stable.  .  Type 2 diabetes. Continue current regimen.   DVT prophylaxis: Eliquis on hold Code Status: full Family Communication:  Disposition Plan:    Status is: Inpatient  Remains inpatient appropriate because:Ongoing diagnostic testing needed not appropriate for outpatient work up and Inpatient level of care appropriate due to severity of illness  Dispo: The patient is from: Home              Anticipated d/c is to: Home              Patient currently is not medically stable to d/c.   Difficult to place patient No        I/O last 3 completed shifts: In: 960 [P.O.:960] Out: 3225 [Urine:3225] No intake/output data recorded.     Consultants:  Cardiology  Procedures:   Antimicrobials:  Doxycycline.  Subjective: Patient condition has improved, currently patient does not have any confusion.  He slept well last night. No chest pain or shortness of breath. No abdominal pain or nausea vomiting. No dysuria hematuria  No fever or chills.  Objective: Vitals:   06/25/21 0045 06/25/21 0442 06/25/21 0442 06/25/21 0803  BP: (!) 176/67  (!) 161/69 (!) 172/75  Pulse: 77  73 75  Resp: '20  20 18  '$ Temp: 97.8 F (36.6 C)  98 F (36.7 C) 98.4 F (36.9 C)  TempSrc: Oral  Oral   SpO2: 100%  93% 98%  Weight:  99.4 kg    Height:  Intake/Output Summary (Last 24 hours) at 06/25/2021 1042 Last data filed at 06/25/2021 0530 Gross per 24 hour  Intake 480 ml  Output 1850 ml  Net -1370 ml   Filed Weights   06/23/21 0500 06/24/21 0500 06/25/21 0442  Weight: 103.2 kg 96.4 kg 99.4 kg    Examination:  General exam: Appears calm and comfortable  Respiratory system: Clear to auscultation. Respiratory effort normal. Cardiovascular system: S1 & S2 heard, RRR. No JVD, murmurs, rubs, gallops or clicks. No  pedal edema. Gastrointestinal system: Abdomen is nondistended, soft and nontender. No organomegaly or masses felt. Normal bowel sounds heard. Central nervous system: Alert and oriented. No focal neurological deficits. Extremities: Symmetric 5 x 5 power. Skin: No rashes, lesions or ulcers Psychiatry: Judgement and insight appear normal. Mood & affect appropriate.     Data Reviewed: I have personally reviewed following labs and imaging studies  CBC: Recent Labs  Lab 06/18/21 1513 06/18/21 2100 06/19/21 0630 06/20/21 0225 06/21/21 0431 06/22/21 0351 06/23/21 0240  WBC 12.0*   < > 10.9* 7.9 7.0 6.2 6.0  NEUTROABS 8.5*  --   --   --   --   --   --   HGB 14.5   < > 12.3* 11.3* 10.4* 11.1* 10.8*  HCT 43.4   < > 34.6* 33.8* 30.9* 34.2* 32.4*  MCV 91.9   < > 92.0 96.6 95.7 95.0 95.3  PLT 201   < > 156 136* 149* 162 163   < > = values in this interval not displayed.   Basic Metabolic Panel: Recent Labs  Lab 06/20/21 0225 06/20/21 1625 06/21/21 0431 06/22/21 0351 06/23/21 0240 06/24/21 0422 06/25/21 0718  NA 136  --  137 137 136 137 134*  K 3.0*   < > 3.5 3.5 3.4* 4.1 3.8  CL 103  --  108 108 109 106 105  CO2 26  --  22 22 21* 23 22  GLUCOSE 108*  --  120* 115* 101* 114* 121*  BUN 17  --  '15 16 15 16 19  '$ CREATININE 1.34*  --  1.15 0.94 0.91 0.91 0.87  CALCIUM 8.0*  --  8.2* 8.6* 8.5* 8.8* 9.1  MG 2.0  --  1.9 2.0 1.9 2.3 1.9  PHOS 2.4*  --  2.8 2.9 3.1  --  4.2   < > = values in this interval not displayed.   GFR: Estimated Creatinine Clearance: 88.2 mL/min (by C-G formula based on SCr of 0.87 mg/dL). Liver Function Tests: Recent Labs  Lab 06/19/21 0630 06/20/21 0225 06/21/21 0431 06/22/21 0351 06/23/21 0240  AST 41 '30 19 18 21  '$ ALT 42 34 '23 20 19  '$ ALKPHOS 46 48 45 45 44  BILITOT 0.8 1.0 1.0 0.9 0.9  PROT 5.7* 6.2* 5.9* 6.5 6.1*  ALBUMIN 2.4* 2.8* 2.6* 2.8* 2.7*   No results for input(s): LIPASE, AMYLASE in the last 168 hours. No results for input(s): AMMONIA  in the last 168 hours. Coagulation Profile: Recent Labs  Lab 06/18/21 1731  INR 1.6*   Cardiac Enzymes: No results for input(s): CKTOTAL, CKMB, CKMBINDEX, TROPONINI in the last 168 hours. BNP (last 3 results) No results for input(s): PROBNP in the last 8760 hours. HbA1C: No results for input(s): HGBA1C in the last 72 hours. CBG: Recent Labs  Lab 06/24/21 0727 06/24/21 1145 06/24/21 1649 06/24/21 2125 06/25/21 0803  GLUCAP 97 180* 94 132* 115*   Lipid Profile: No results for input(s): CHOL, HDL, LDLCALC, TRIG, CHOLHDL, LDLDIRECT  in the last 72 hours. Thyroid Function Tests: No results for input(s): TSH, T4TOTAL, FREET4, T3FREE, THYROIDAB in the last 72 hours. Anemia Panel: No results for input(s): VITAMINB12, FOLATE, FERRITIN, TIBC, IRON, RETICCTPCT in the last 72 hours. Sepsis Labs: Recent Labs  Lab 06/18/21 1731 06/18/21 2000 06/18/21 2100 06/19/21 0630 06/20/21 0225  PROCALCITON  --   --  0.31 0.38 0.25  LATICACIDVEN 2.6* 2.1*  --  1.3  --     Recent Results (from the past 240 hour(s))  Resp Panel by RT-PCR (Flu A&B, Covid) Nasopharyngeal Swab     Status: None   Collection Time: 06/18/21  3:12 PM   Specimen: Nasopharyngeal Swab; Nasopharyngeal(NP) swabs in vial transport medium  Result Value Ref Range Status   SARS Coronavirus 2 by RT PCR NEGATIVE NEGATIVE Final    Comment: (NOTE) SARS-CoV-2 target nucleic acids are NOT DETECTED.  The SARS-CoV-2 RNA is generally detectable in upper respiratory specimens during the acute phase of infection. The lowest concentration of SARS-CoV-2 viral copies this assay can detect is 138 copies/mL. A negative result does not preclude SARS-Cov-2 infection and should not be used as the sole basis for treatment or other patient management decisions. A negative result may occur with  improper specimen collection/handling, submission of specimen other than nasopharyngeal swab, presence of viral mutation(s) within the areas  targeted by this assay, and inadequate number of viral copies(<138 copies/mL). A negative result must be combined with clinical observations, patient history, and epidemiological information. The expected result is Negative.  Fact Sheet for Patients:  EntrepreneurPulse.com.au  Fact Sheet for Healthcare Providers:  IncredibleEmployment.be  This test is no t yet approved or cleared by the [place] FDA and  has been authorized for detection and/or diagnosis of SARS-CoV-2 by FDA under an Emergency Use Authorization (EUA). This EUA will remain  in effect (meaning this test can be used) for the duration of the COVID-19 declaration under Section 564(b)(1) of the Act, 21 U.S.C.section 360bbb-3(b)(1), unless the authorization is terminated  or revoked sooner.       Influenza A by PCR NEGATIVE NEGATIVE Final   Influenza B by PCR NEGATIVE NEGATIVE Final    Comment: (NOTE) The Xpert Xpress SARS-CoV-2/FLU/RSV plus assay is intended as an aid in the diagnosis of influenza from Nasopharyngeal swab specimens and should not be used as a sole basis for treatment. Nasal washings and aspirates are unacceptable for Xpert Xpress SARS-CoV-2/FLU/RSV testing.  Fact Sheet for Patients: EntrepreneurPulse.com.au  Fact Sheet for Healthcare Providers: IncredibleEmployment.be  This test is not yet approved or cleared by the [place] FDA and has been authorized for detection and/or diagnosis of SARS-CoV-2 by FDA under an Emergency Use Authorization (EUA). This EUA will remain in effect (meaning this test can be used) for the duration of the COVID-19 declaration under Section 564(b)(1) of the Act, 21 U.S.C. section 360bbb-3(b)(1), unless the authorization is terminated or revoked.  Performed at [facility], [address] [place], [place] [address]   Blood Culture (routine x 2)     Status: None   Collection  Time: 06/18/21  5:31 PM   Specimen: BLOOD  Result Value Ref Range Status   Specimen Description BLOOD RIGHT HAND  Final   Special Requests   Final    BOTTLES DRAWN AEROBIC AND ANAEROBIC Blood Culture adequate volume   Culture   Final    NO GROWTH 5 DAYS Performed at [facility], [address] [place], [place] [address]    Report  Status 06/23/2021 FINAL  Final  Blood Culture (routine x 2)     Status: None   Collection Time: 06/18/21  5:31 PM   Specimen: BLOOD  Result Value Ref Range Status   Specimen Description BLOOD RIGHT [facility]  Final   Special Requests   Final    BOTTLES DRAWN AEROBIC AND ANAEROBIC Blood Culture adequate volume   Culture   Final    NO GROWTH 5 DAYS Performed at [facility], [address] [place], [place] [address]    Report Status 06/23/2021 FINAL  Final  Urine Culture     Status: None   Collection Time: 06/18/21  5:31 PM   Specimen: In/Out Cath Urine  Result Value Ref Range Status   Specimen Description   Final    IN/OUT CATH URINE Performed at [facility], [address] [place], [place] [address]    Special Requests   Final    NONE Performed at [facility], [address], [place], [place] [address]    Culture   Final    NO GROWTH Performed at [facility], [place] [address], [place], [place] [address]    Report Status 06/20/2021 FINAL  Final         Radiology Studies: NM Myocar Multi W/Spect W/Wall Motion / EF  Result Date: 06/24/2021   Findings are consistent with ischemia. The study is high risk.   No ST deviation was noted.   LV perfusion is abnormal. There is evidence of ischemia. Defect 1: There is a large defect with severe reduction in uptake present in the apical to mid anterior and anteroseptal location(s) that is reversible. Consistent with ischemia.   Left ventricular function is abnormal. Nuclear stress EF: 47 %. The left ventricular ejection fraction is mildly decreased (45-54%).  Conclusion: Abnormal high risk study There is a large, severe, mostly reversible defect involving the mid to apical anterior wall (LAD distribution) that is consistent with ischemia. Mildly decreased LV systolic function.        Scheduled Meds:  acidophilus  2 capsule Oral TID   brimonidine  1 drop Both Eyes BID   Chlorhexidine Gluconate Cloth  6 each Topical Daily   clopidogrel  75 mg Oral Daily   docusate sodium  100 mg Oral BID   doxycycline  100 mg Oral [id number]   folic acid  1 mg Oral Daily   guaiFENesin  600 mg Oral BID   insulin aspart  0-15 Units Subcutaneous TID AC & HS   isosorbide mononitrate  30 mg Oral Daily   latanoprost  1 drop Both Eyes QHS   losartan  50 mg Oral Daily   metoprolol succinate  50 mg Oral BID   multivitamin with minerals  1 tablet Oral Daily   pantoprazole (PROTONIX) IV  40 mg Intravenous QHS   sodium chloride flush  3 mL Intravenous Q12H   thiamine injection  100 mg Intravenous Daily   Continuous Infusions:  sodium chloride     sodium chloride     sodium chloride 1 mL/kg/hr (06/25/21 0951)     LOS: 7 days    Time spent: 28 minutes    Sharen Hones, MD [facility]   To contact the attending provider between 7A-7P or the covering provider during after hours 7P-7A, please log into the web site [email or web address] and access using universal [place] password for that web site. If you do not have the password, please call the hospital operator.  06/25/2021,  10:42 AM

## 2021-06-25 NOTE — Progress Notes (Signed)
PT Cancellation Note  Patient Details Name: Damon English MRN: [id number] DOB: Jan 19, 1951   Cancelled Treatment:    Reason Eval/Treat Not Completed: Other (comment). Pt out of room at this time, PT to re-attempt as able.   Lieutenant Diego PT, DPT 1:12 PM,06/25/21

## 2021-06-25 NOTE — Consult Note (Signed)
PHARMACY CONSULT NOTE - FOLLOW UP  Pharmacy Consult for Electrolyte Monitoring and Replacement   Recent Labs: Potassium (mmol/L)  Date Value  06/24/2021 4.1  11/08/2013 3.4 (L)   Magnesium (mg/dL)  Date Value  06/24/2021 2.3   Calcium (mg/dL)  Date Value  06/24/2021 8.8 (L)   Calcium, Total (mg/dL)  Date Value  11/08/2013 9.3   Albumin (g/dL)  Date Value  06/23/2021 2.7 (L)   Phosphorus (mg/dL)  Date Value  06/23/2021 3.1   Sodium (mmol/L)  Date Value  06/24/2021 137  11/08/2013 136     Assessment: 70 yo male presented to [facility] ER on 09/2 following a cardiac arrest.  Per ER notes pts wife reported while the pt was leaving his outpatient doctors office he felt like he was going to pass out.  He sat on his walker and lost consciousness.  Pts wife reported he briefly regained consciousness several times EMS notified.  When EMS arrived pt unresponsive and had a pulse, but required defibrillation x2.  9/6-7: Pt received 3mq Kcl PO x1 on 0906 and K decreased from 3.5>3.4. Will increase amount given today.  9/7-8: Pt received Kcl 419m PO x2; K 3.4>4.1. 9/8-9: K 3.8 and Mg 1.9  Goal of Therapy:  Mag ~2.0 K~ 4.0 WNL  Plan:  No repletion is necessary at this time Follow up with AM labs Pharmacy will sign off, please re-consult if needed  BrNarda RutherfordPharmD Pharmacy Resident  06/25/2021 1:38 PM

## 2021-06-25 NOTE — Interval H&P Note (Signed)
History and Physical Interval Note:  06/25/2021 12:53 PM  Damon English  has presented today for surgery, with the diagnosis of nstemi.  The various methods of treatment have been discussed with the patient and family. After consideration of risks, benefits and other options for treatment, the patient has consented to  Procedure(s): LEFT HEART CATH AND CORS/GRAFTS ANGIOGRAPHY (N/A) as a surgical intervention.  The patient's history has been reviewed, patient examined, no change in status, stable for surgery.  I have reviewed the patient's chart and labs.  Questions were answered to the patient's satisfaction.     [place]

## 2021-06-26 DIAGNOSIS — L0390 Cellulitis, unspecified: Secondary | ICD-10-CM | POA: Insufficient documentation

## 2021-06-26 DIAGNOSIS — I110 Hypertensive heart disease with heart failure: Secondary | ICD-10-CM | POA: Diagnosis not present

## 2021-06-26 DIAGNOSIS — I25810 Atherosclerosis of coronary artery bypass graft(s) without angina pectoris: Secondary | ICD-10-CM | POA: Diagnosis not present

## 2021-06-26 DIAGNOSIS — Z20822 Contact with and (suspected) exposure to covid-19: Secondary | ICD-10-CM | POA: Diagnosis not present

## 2021-06-26 DIAGNOSIS — I214 Non-ST elevation (NSTEMI) myocardial infarction: Secondary | ICD-10-CM | POA: Diagnosis not present

## 2021-06-26 DIAGNOSIS — K219 Gastro-esophageal reflux disease without esophagitis: Secondary | ICD-10-CM | POA: Diagnosis not present

## 2021-06-26 DIAGNOSIS — Z885 Allergy status to narcotic agent status: Secondary | ICD-10-CM | POA: Diagnosis not present

## 2021-06-26 DIAGNOSIS — E876 Hypokalemia: Secondary | ICD-10-CM | POA: Diagnosis not present

## 2021-06-26 DIAGNOSIS — I25720 Atherosclerosis of autologous artery coronary artery bypass graft(s) with unstable angina pectoris: Secondary | ICD-10-CM | POA: Diagnosis not present

## 2021-06-26 DIAGNOSIS — L03115 Cellulitis of right lower limb: Secondary | ICD-10-CM | POA: Diagnosis not present

## 2021-06-26 DIAGNOSIS — I25118 Atherosclerotic heart disease of native coronary artery with other forms of angina pectoris: Secondary | ICD-10-CM | POA: Diagnosis not present

## 2021-06-26 DIAGNOSIS — Z7901 Long term (current) use of anticoagulants: Secondary | ICD-10-CM | POA: Diagnosis not present

## 2021-06-26 DIAGNOSIS — Z86718 Personal history of other venous thrombosis and embolism: Secondary | ICD-10-CM | POA: Diagnosis not present

## 2021-06-26 DIAGNOSIS — T82855A Stenosis of coronary artery stent, initial encounter: Secondary | ICD-10-CM | POA: Diagnosis not present

## 2021-06-26 DIAGNOSIS — Z880 Allergy status to penicillin: Secondary | ICD-10-CM | POA: Diagnosis not present

## 2021-06-26 DIAGNOSIS — E785 Hyperlipidemia, unspecified: Secondary | ICD-10-CM | POA: Diagnosis not present

## 2021-06-26 DIAGNOSIS — I4901 Ventricular fibrillation: Secondary | ICD-10-CM | POA: Diagnosis not present

## 2021-06-26 DIAGNOSIS — E119 Type 2 diabetes mellitus without complications: Secondary | ICD-10-CM | POA: Diagnosis not present

## 2021-06-26 DIAGNOSIS — F1010 Alcohol abuse, uncomplicated: Secondary | ICD-10-CM | POA: Diagnosis not present

## 2021-06-26 DIAGNOSIS — I462 Cardiac arrest due to underlying cardiac condition: Secondary | ICD-10-CM | POA: Diagnosis not present

## 2021-06-26 DIAGNOSIS — Z886 Allergy status to analgesic agent status: Secondary | ICD-10-CM | POA: Diagnosis not present

## 2021-06-26 DIAGNOSIS — I5042 Chronic combined systolic (congestive) and diastolic (congestive) heart failure: Secondary | ICD-10-CM | POA: Diagnosis not present

## 2021-06-26 DIAGNOSIS — I5022 Chronic systolic (congestive) heart failure: Secondary | ICD-10-CM | POA: Diagnosis not present

## 2021-06-26 LAB — APTT: aPTT: 59 seconds — ABNORMAL HIGH (ref 24–36)

## 2021-06-26 LAB — HEPARIN LEVEL (UNFRACTIONATED): Heparin Unfractionated: 0.31 IU/mL (ref 0.30–0.70)

## 2021-06-26 MED ORDER — DOXYCYCLINE HYCLATE 100 MG PO TABS: 100.0000 mg | ORAL_TABLET | Freq: Two times a day (BID) | ORAL | 0 refills | Status: DC

## 2021-06-26 MED ORDER — NITROGLYCERIN 0.4 MG SL SUBL: 0.4000 mg | SUBLINGUAL_TABLET | SUBLINGUAL | 12 refills | Status: AC | PRN

## 2021-06-26 MED ORDER — LOSARTAN POTASSIUM 50 MG PO TABS: 50.0000 mg | ORAL_TABLET | Freq: Every day | ORAL | 0 refills | Status: DC

## 2021-06-26 MED ORDER — PANTOPRAZOLE SODIUM 40 MG IV SOLR: 40.0000 mg | Freq: Every day | INTRAVENOUS | 0 refills | Status: DC

## 2021-06-26 MED ORDER — SODIUM CHLORIDE 0.9 % IV SOLN: 250.0000 mL | INTRAVENOUS | 0 refills | Status: DC | PRN

## 2021-06-26 MED ORDER — ACETAMINOPHEN 325 MG PO TABS: 650.0000 mg | ORAL_TABLET | Freq: Four times a day (QID) | ORAL | 0 refills | Status: AC | PRN

## 2021-06-26 MED ORDER — ADULT MULTIVITAMIN W/MINERALS CH: 1.0000 | ORAL_TABLET | Freq: Every day | ORAL | 0 refills | Status: DC

## 2021-06-26 MED ORDER — SODIUM CHLORIDE 0.9 % IV SOLN: 10.0000 mL | INTRAVENOUS | 0 refills | Status: DC

## 2021-06-26 MED ORDER — SODIUM CHLORIDE 0.9% FLUSH: 3.0000 mL | INTRAVENOUS | 0 refills | Status: DC | PRN

## 2021-06-26 MED ORDER — CHLORHEXIDINE GLUCONATE CLOTH 2 % EX PADS: 6.0000 | MEDICATED_PAD | Freq: Every day | CUTANEOUS | 0 refills | Status: DC

## 2021-06-26 MED ORDER — DIPHENHYDRAMINE HCL 25 MG PO CAPS: 25.0000 mg | ORAL_CAPSULE | Freq: Once | ORAL | 0 refills | Status: DC | PRN

## 2021-06-26 MED ORDER — SODIUM CHLORIDE 0.9% FLUSH: 3.0000 mL | Freq: Two times a day (BID) | INTRAVENOUS | 0 refills | Status: DC

## 2021-06-26 MED ORDER — THIAMINE HCL 100 MG PO TABS: 100.0000 mg | ORAL_TABLET | Freq: Every day | ORAL | 0 refills | Status: DC

## 2021-06-26 MED ORDER — PHENOL 1.4 % MT LIQD: 1.0000 | OROMUCOSAL | 0 refills | Status: DC | PRN

## 2021-06-26 MED ORDER — LATANOPROST 0.005 % OP SOLN: 1.0000 [drp] | Freq: Every day | OPHTHALMIC | 12 refills | Status: AC

## 2021-06-26 MED ORDER — GUAIFENESIN ER 600 MG PO TB12: 600.0000 mg | ORAL_TABLET | Freq: Two times a day (BID) | ORAL | 0 refills | Status: DC

## 2021-06-26 MED ORDER — ACETAMINOPHEN 650 MG RE SUPP: 650.0000 mg | Freq: Four times a day (QID) | RECTAL | 0 refills | Status: DC | PRN

## 2021-06-26 MED ORDER — ONDANSETRON HCL 4 MG/2ML IJ SOLN: 4.0000 mg | Freq: Four times a day (QID) | INTRAMUSCULAR | 0 refills | Status: DC | PRN

## 2021-06-26 MED ORDER — CLONAZEPAM 0.25 MG PO TBDP: 0.2500 mg | ORAL_TABLET | Freq: Three times a day (TID) | ORAL | 0 refills | Status: AC | PRN

## 2021-06-26 MED ORDER — METOPROLOL SUCCINATE ER 50 MG PO TB24: 50.0000 mg | ORAL_TABLET | Freq: Two times a day (BID) | ORAL | 0 refills | Status: DC

## 2021-06-26 MED ORDER — POLYETHYLENE GLYCOL 3350 17 G PO PACK: 17.0000 g | PACK | Freq: Every day | ORAL | 0 refills | Status: AC | PRN

## 2021-06-26 MED ORDER — CLOPIDOGREL BISULFATE 75 MG PO TABS: 75.0000 mg | ORAL_TABLET | Freq: Every day | ORAL | 0 refills | Status: AC

## 2021-06-26 MED ORDER — SODIUM CHLORIDE 0.9 % IV SOLN: 250.0000 mL | INTRAVENOUS | 0 refills | Status: DC

## 2021-06-26 MED ORDER — FOLIC ACID 1 MG PO TABS
1.0000 mg | ORAL_TABLET | Freq: Every day | ORAL | 0 refills | Status: DC
Start: 2021-06-26 — End: 2022-03-29

## 2021-06-26 MED ORDER — INSULIN ASPART 100 UNIT/ML IJ SOLN: 0.0000 [IU] | Freq: Three times a day (TID) | INTRAMUSCULAR | 11 refills | Status: DC

## 2021-06-26 MED ORDER — DOCUSATE SODIUM 100 MG PO CAPS: 100.0000 mg | ORAL_CAPSULE | Freq: Two times a day (BID) | ORAL | 0 refills | Status: DC

## 2021-06-26 MED ORDER — ACETAMINOPHEN 160 MG/5ML PO SOLN: 650.0000 mg | Freq: Four times a day (QID) | ORAL | 0 refills | Status: DC | PRN

## 2021-06-26 MED ORDER — BRIMONIDINE TARTRATE 0.2 % OP SOLN: 1.0000 [drp] | Freq: Two times a day (BID) | OPHTHALMIC | 12 refills | Status: DC

## 2021-06-26 MED ORDER — RISAQUAD PO CAPS: 2.0000 | ORAL_CAPSULE | Freq: Three times a day (TID) | ORAL | 0 refills | Status: DC

## 2021-06-26 MED ORDER — HEPARIN (PORCINE) 25000 UT/250ML-% IV SOLN: 1050.0000 [IU]/h | INTRAVENOUS | 0 refills | Status: DC

## 2021-06-26 MED ORDER — ISOSORBIDE MONONITRATE ER 30 MG PO TB24: 30.0000 mg | ORAL_TABLET | Freq: Every day | ORAL | 0 refills | Status: DC

## 2021-06-26 NOTE — Consult Note (Signed)
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  Is on NOAC for now.  No ASA with it. Not allergic per patient.     Patient Measurements: Height: '5\' 5"'$  (165.1 cm) Weight: 99.4 kg (219 lb 2.2 oz) IBW/kg (Calculated) : 61.5 Heparin Dosing Weight: 83.7 kg   Vital Signs: Temp: 97.7 F (36.5 C) (09/10 0051) Temp Source: Oral (09/10 0029) BP: 179/90 (09/10 0051) Pulse Rate: 79 (09/10 0051)  Labs: Recent Labs    06/23/21 0240 06/24/21 0422 06/25/21 0718 06/26/21 0021  HGB 10.8*  --   --   --   HCT 32.4*  --   --   --   PLT 163  --   --   --   APTT  --   --   --  59*  HEPARINUNFRC  --   --   --  0.31  CREATININE 0.91 0.91 0.87  --      Estimated Creatinine Clearance: 88.2 mL/min (by C-G formula based on SCr of 0.87 mg/dL).   Medical History: Past Medical History:  Diagnosis Date   Alcohol use    "cutting back" but still heavy and daily, multiple shots of bourbon each night   Diabetes mellitus without complication ([place])    History of tobacco use    reportedly quit in 2013   Hypertension    Pulmonary embolism ([place]) 2012   unprovoked   PVD (peripheral vascular disease) ([place])     Medications:  Apixaban 5 mg BID PTA -- last dose 06/23/21 @ 1200 -- held for cath procedure Heparin SQ: 6,000 units given 06/25/21 @ 1300 during cath procedure   Assessment: 70 yo male presented to [facility] ER on 09/2 following a cardiac arrest. S/p initial medical management. 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 Last dose >48 hours ago -- held for planned PCI. Pharmacy has been consulted to transition to heparin infusion following catheter procedure.   Goal of Therapy:  Heparin level 0.3-0.7 units/ml Monitor platelets by anticoagulation protocol: Yes  9/10 0021 HL 0.31, therapeutic x 1,  aPTT 59   Plan:  Continue heparin infusion 1050 units/hr Will recheck HL w/ AM labs Continue to monitor H&H and platelets  Renda Rolls, PharmD, [facility] 06/26/2021 1:47 AM

## 2021-06-26 NOTE — Progress Notes (Signed)
Report called to [facility] at [place] on 06/26/2021.  Elisabeth Pigeon, RN received report.  Patient AOX4, in NAD.  V/S stable but hypertensive. [facility] at bedside to pick up patient.  Patient removed from telemetry.  Patient discharged with [facility] and all his belongings.  Heparin gtt running at 1050 units/hr.

## 2021-06-26 NOTE — Discharge Summary (Signed)
Physician Discharge Summary  Patient ID: Damon English MRN: [id number] DOB/AGE: Aug 06, 1951 70 y.o.  Admit date: 06/18/2021 Discharge date: 06/26/2021  Admission Diagnoses:  Discharge Diagnoses:  Active Problems:   NSTEMI (non-ST elevated myocardial infarction) (HCC)   Chronic systolic CHF (congestive heart failure) (HCC)   Ischemic cardiomyopathy   Type 2 diabetes mellitus (HCC)   Cardiac arrest (HCC)   ETOH abuse   Acute respiratory failure with hypoxia and hypercapnia (HCC)   On mechanically assisted ventilation (HCC)   Cellulitis of leg, right   Sepsis ([place])   Acute kidney injury (AKI) with acute tubular necrosis (ATN) (HCC)   Acute encephalopathy   Hypokalemia   Transaminitis   Pressure injury of skin   Discharged Condition: good  Hospital Course:  Patient is a 70 year old male with history of alcohol abuse, chronic systolic congestive heart failure, coronary artery disease, type 2 diabetes, who presents to the hospital after cardiac arrest.  He received defibrillation by EMS prior to arrival in the hospital.  She was intubated after arriving the emergency room. Patient also had a potassium of 2.2, which is presumed to be the cardiac arrest.  CT angiogram of the chest did not show PE. Echocardiogram showed ejection fraction 50%.  No regional abnormality.  #1.  Cardiac arrest with ventricular fibrillation secondary to severe hypokalemia. Elevated troponin secondary to cardiac arrest. Complete AV block, resolved. Chronic systolic congestive heart failure.  Orthostatic hypotension. Coronary artery disease status post CABG. Condition had improved, no additional chest pain or shortness of breath.  Heart cath results as below, patient is transferred to [facility] [place] by cardiology for intervention.   2.  Acute kidney injury secondary to ATN. Hypokalemia. Condition has resolved.  3.  Sepsis secondary to right lower extremity cellulitis. Right lower extremity cellulitis.    Recent tick bites. Due to altered mental status, patient will be complete 2 weeks of doxycycline.  4.  Acute metabolic encephalopathy. Alcohol use disorder. Condition stable, mental status has improved.  5.  History of DVT. Eliquis on hold due to heart cath.  6.  Liver cirrhosis P Stable.  .  Type 2 diabetes. Continue current regimen.   Consults: cardiology  Significant Diagnostic Studies: Heart cath    Ost Cx to Prox Cx lesion is 50% stenosed.   Prox RCA to Mid RCA lesion is 50% stenosed.   Dist RCA lesion is 35% stenosed.   Dist LM to Ost LAD lesion is 100% stenosed.   Dist Graft to Insertion lesion is 75% stenosed.   Ost LM to Mid LM lesion is 25% stenosed.   Ost 2nd Mrg lesion is 50% stenosed.   Origin to Prox Graft lesion is 100% stenosed.   LV end diastolic pressure is normal.   There is no aortic valve stenosis.   Conclusion Severe native three-vessel coronary artery disease including CTO of the LAD.  50% mid LCx, and serial 50% lesions in the RCA. Patent SVG to RPDA Occluded to SVG to OM 2 (previously long 99% ostial to mid lesion) Patent LIMA to LAD. There is a 75% lesion within the previously placed stent at the anastomosis of the LIMA to LAD.   Recommendation: Transfer patient to tertiary care center for PCI to LIMA/LAD ISR. Hold Eliquis over the weekend. Start heparin 4 hrs after TR band removal. Continue plavix as previously prescribed.   Treatments:   Discharge Exam: Blood pressure (!) 179/90, pulse 79, temperature 97.7 F (36.5 C), resp. rate 20, height 5' 5" (1.651 m),  weight 99.4 kg, SpO2 100 %. General appearance: alert and cooperative Resp: clear to auscultation bilaterally Cardio: regular rate and rhythm, S1, S2 normal, no murmur, click, rub or gallop GI: soft, non-tender; bowel sounds normal; no masses,  no organomegaly Extremities: extremities normal, atraumatic, no cyanosis or edema  Disposition: Discharge disposition: [place] Not Defined        Allergies as of 06/26/2021       Reactions   Amoxicillin Other (See Comments)   Abdominal cramps Other reaction(s): Other (See Comments) Abdominal cramps Tolerated ancef 7/16   Hydromorphone Hcl    Quick & severe nausea/vomiting  Other reaction(s): Vomiting Quick & severe nausea/vomiting    Aspirin    Other reaction(s): Other (See Comments) 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  Is on NOAC for now.  No ASA with it. Not allergic per patient.         Medication List     STOP taking these medications    clonazePAM 0.5 MG tablet Commonly known as: KLONOPIN Replaced by: clonazePAM 0.25 MG disintegrating tablet   Latanoprost 0.005 % Emul Replaced by: latanoprost 0.005 % ophthalmic solution       TAKE these medications    acetaminophen 650 MG suppository Commonly known as: TYLENOL Place 1 suppository (650 mg total) rectally every 6 (six) hours as needed for fever.   acetaminophen 160 MG/5ML solution Commonly known as: TYLENOL Take 20.3 mLs (650 mg total) by mouth every 6 (six) hours as needed for fever.   acetaminophen 325 MG tablet Commonly known as: TYLENOL Take 2 tablets (650 mg total) by mouth every 6 (six) hours as needed for fever.   acidophilus Caps capsule Take 2 capsules by mouth 3 (three) times daily.   apixaban 5 MG Tabs tablet Commonly known as: ELIQUIS Take 5 mg by mouth 2 (two) times daily.   atorvastatin 80 MG tablet Commonly known as: LIPITOR Take 80 mg by mouth daily.   brimonidine 0.2 % ophthalmic solution Commonly known as: ALPHAGAN Place 1 drop into both eyes 2 (two) times daily. What changed: when to take this   cephALEXin 500 MG capsule Commonly known as: KEFLEX Take 500 mg by mouth 3 (three) times daily.   Chlorhexidine Gluconate Cloth 2 % Pads Apply 6 each topically daily.   clonazePAM 0.25 MG disintegrating tablet Commonly known as: KLONOPIN Take 1-2 tablets (0.25-0.5 mg total) by mouth 3  (three) times daily as needed (anxiety/agitation). Replaces: clonazePAM 0.5 MG tablet   clopidogrel 75 MG tablet Commonly known as: PLAVIX Take 1 tablet (75 mg total) by mouth daily.   diclofenac Sodium 1 % Gel Commonly known as: VOLTAREN Apply 4 g topically 4 (four) times daily.   diphenhydrAMINE 25 mg capsule Commonly known as: BENADRYL Take 1 capsule (25 mg total) by mouth once as needed for itching.   docusate sodium 100 MG capsule Commonly known as: COLACE Take 1 capsule (100 mg total) by mouth 2 (two) times daily.   doxycycline 100 MG tablet Commonly known as: VIBRA-TABS Take 1 tablet (100 mg total) by mouth every 12 (twelve) hours.   DULoxetine 60 MG capsule Commonly known as: CYMBALTA Take 1 capsule (60 mg total) by mouth 2 (two) times daily.   Enstilar 0.005-0.064 % Foam Generic drug: Calcipotriene-Betameth Diprop APPLY THIN LAYER TO AFFECTED AREAS ONCE DAILY UNTIL FLAT AND ASYMPTOMATIC, MAX 4 WEEKS   esomeprazole 40 MG capsule Commonly known as: NEXIUM Take 1 capsule by mouth daily.   famotidine 40 MG  tablet Commonly known as: PEPCID Take 1 tablet by mouth every morning.   folic acid 1 MG tablet Commonly known as: FOLVITE Take 1 tablet (1 mg total) by mouth daily.   furosemide 20 MG tablet Commonly known as: LASIX Take 40 mg by mouth daily.   glipiZIDE 10 MG 24 hr tablet Commonly known as: GLUCOTROL XL Take 10 mg by mouth every morning.   guaiFENesin 600 MG 12 hr tablet Commonly known as: MUCINEX Take 1 tablet (600 mg total) by mouth 2 (two) times daily.   heparin 25000 UT/250ML infusion Inject 1,050 Units/hr into the vein continuous.   HYDROcodone-acetaminophen 5-325 MG tablet Commonly known as: NORCO/VICODIN Take 0.5-1 tablets by mouth 2 (two) times daily as needed for pain.   insulin aspart 100 UNIT/ML injection Commonly known as: novoLOG Inject 0-15 Units into the skin 4 (four) times daily -  before meals and at bedtime.   isosorbide  mononitrate 30 MG 24 hr tablet Commonly known as: IMDUR Take 1 tablet (30 mg total) by mouth daily.   ketoconazole 2 % shampoo Commonly known as: NIZORAL Apply topically.   latanoprost 0.005 % ophthalmic solution Commonly known as: XALATAN Place 1 drop into both eyes at bedtime. Replaces: Latanoprost 0.005 % Emul   losartan 50 MG tablet Commonly known as: COZAAR Take 1 tablet (50 mg total) by mouth daily.   Magnesium 250 MG Tabs Take by mouth.   metoprolol succinate 50 MG 24 hr tablet Commonly known as: TOPROL-XL Take 1 tablet (50 mg total) by mouth 2 (two) times daily. Take with or immediately following a meal. What changed: when to take this   midodrine 2.5 MG tablet Commonly known as: PROAMATINE Take 2.5 mg by mouth daily.   multivitamin with minerals Tabs tablet Take 1 tablet by mouth daily.   nitroGLYCERIN 0.4 MG SL tablet Commonly known as: NITROSTAT Place 1 tablet (0.4 mg total) under the tongue every 5 (five) minutes as needed for chest pain.   omeprazole 40 MG capsule Commonly known as: PRILOSEC Take 40 mg by mouth daily.   ondansetron 4 MG/2ML Soln injection Commonly known as: ZOFRAN Inject 2 mLs (4 mg total) into the vein every 6 (six) hours as needed for nausea.   ONE TOUCH ULTRA 2 w/Device Kit See admin instructions.   OneTouch Ultra test strip Generic drug: glucose blood 2 (two) times daily.   pantoprazole 40 MG injection Commonly known as: PROTONIX Inject 40 mg into the vein at bedtime.   phenol 1.4 % Liqd Commonly known as: CHLORASEPTIC Use as directed 1 spray in the mouth or throat as needed for throat irritation / pain.   polyethylene glycol 17 g packet Commonly known as: MIRALAX / GLYCOLAX Take 17 g by mouth daily as needed for mild constipation or moderate constipation.   ramipril 2.5 MG capsule Commonly known as: ALTACE Take by mouth.   ranolazine 500 MG 12 hr tablet Commonly known as: RANEXA Take 500 mg by mouth 2 (two) times  daily.   SENNOSIDES PO Take by mouth.   sodium chloride 0.9 % infusion Inject 250 mLs into the vein continuous.   sodium chloride 0.9 % infusion Inject 10 mLs into the vein continuous.   sodium chloride 0.9 % infusion Inject 250 mLs into the vein as needed (for IV line care  (Saline / Heparin Lock)).   sodium chloride flush 0.9 % Soln Commonly known as: NS Inject 3 mLs into the vein every 12 (twelve) hours.   sodium chloride  flush 0.9 % Soln Commonly known as: NS Inject 3 mLs into the vein every 12 (twelve) hours.   sodium chloride flush 0.9 % Soln Commonly known as: NS Inject 3 mLs into the vein as needed.   spironolactone 25 MG tablet Commonly known as: ALDACTONE Take 25 mg by mouth.   sucralfate 1 g tablet Commonly known as: CARAFATE Take 1 tablet by mouth 4 (four) times daily -  before meals and at bedtime.   thiamine 100 MG tablet Take 1 tablet (100 mg total) by mouth daily.         Signed: Sharen Hones 06/26/2021, 7:09 AM

## 2021-06-28 ENCOUNTER — Encounter: Payer: Self-pay | Admitting: Cardiology

## 2021-07-06 DIAGNOSIS — I2510 Atherosclerotic heart disease of native coronary artery without angina pectoris: Secondary | ICD-10-CM | POA: Diagnosis not present

## 2021-07-11 ENCOUNTER — Encounter
Admission: EM | Disposition: A | Discharge status: Other Acute Inpatient Hospital | Payer: Self-pay | Source: Home / Self Care | Attending: Internal Medicine

## 2021-07-11 ENCOUNTER — Emergency Department: Payer: BC Managed Care – PPO

## 2021-07-11 ENCOUNTER — Other Ambulatory Visit: Payer: Self-pay

## 2021-07-11 ENCOUNTER — Inpatient Hospital Stay: Payer: BC Managed Care – PPO

## 2021-07-11 ENCOUNTER — Inpatient Hospital Stay
Admission: EM | Admit: 2021-07-11 | Discharge: 2021-07-12 | DRG: 261 | Disposition: A | Discharge status: Other Acute Inpatient Hospital | Payer: BC Managed Care – PPO | Attending: Internal Medicine | Admitting: Internal Medicine

## 2021-07-11 DIAGNOSIS — F419 Anxiety disorder, unspecified: Secondary | ICD-10-CM | POA: Diagnosis present

## 2021-07-11 DIAGNOSIS — E876 Hypokalemia: Secondary | ICD-10-CM | POA: Diagnosis present

## 2021-07-11 DIAGNOSIS — Z87891 Personal history of nicotine dependence: Secondary | ICD-10-CM | POA: Diagnosis not present

## 2021-07-11 DIAGNOSIS — Z9889 Other specified postprocedural states: Secondary | ICD-10-CM | POA: Diagnosis not present

## 2021-07-11 DIAGNOSIS — F1010 Alcohol abuse, uncomplicated: Secondary | ICD-10-CM | POA: Diagnosis present

## 2021-07-11 DIAGNOSIS — M542 Cervicalgia: Secondary | ICD-10-CM | POA: Diagnosis not present

## 2021-07-11 DIAGNOSIS — I2510 Atherosclerotic heart disease of native coronary artery without angina pectoris: Secondary | ICD-10-CM | POA: Diagnosis not present

## 2021-07-11 DIAGNOSIS — Z6831 Body mass index (BMI) 31.0-31.9, adult: Secondary | ICD-10-CM

## 2021-07-11 DIAGNOSIS — K219 Gastro-esophageal reflux disease without esophagitis: Secondary | ICD-10-CM | POA: Diagnosis present

## 2021-07-11 DIAGNOSIS — E1139 Type 2 diabetes mellitus with other diabetic ophthalmic complication: Secondary | ICD-10-CM | POA: Diagnosis not present

## 2021-07-11 DIAGNOSIS — G8929 Other chronic pain: Secondary | ICD-10-CM | POA: Diagnosis not present

## 2021-07-11 DIAGNOSIS — Z8659 Personal history of other mental and behavioral disorders: Secondary | ICD-10-CM

## 2021-07-11 DIAGNOSIS — I5022 Chronic systolic (congestive) heart failure: Secondary | ICD-10-CM | POA: Diagnosis not present

## 2021-07-11 DIAGNOSIS — I4510 Unspecified right bundle-branch block: Secondary | ICD-10-CM

## 2021-07-11 DIAGNOSIS — E854 Organ-limited amyloidosis: Secondary | ICD-10-CM | POA: Diagnosis not present

## 2021-07-11 DIAGNOSIS — I469 Cardiac arrest, cause unspecified: Secondary | ICD-10-CM | POA: Diagnosis present

## 2021-07-11 DIAGNOSIS — E1151 Type 2 diabetes mellitus with diabetic peripheral angiopathy without gangrene: Secondary | ICD-10-CM | POA: Diagnosis not present

## 2021-07-11 DIAGNOSIS — I252 Old myocardial infarction: Secondary | ICD-10-CM | POA: Diagnosis not present

## 2021-07-11 DIAGNOSIS — R001 Bradycardia, unspecified: Secondary | ICD-10-CM | POA: Diagnosis present

## 2021-07-11 DIAGNOSIS — E669 Obesity, unspecified: Secondary | ICD-10-CM | POA: Diagnosis present

## 2021-07-11 DIAGNOSIS — F32A Depression, unspecified: Secondary | ICD-10-CM | POA: Diagnosis present

## 2021-07-11 DIAGNOSIS — I6523 Occlusion and stenosis of bilateral carotid arteries: Secondary | ICD-10-CM | POA: Diagnosis not present

## 2021-07-11 DIAGNOSIS — I517 Cardiomegaly: Secondary | ICD-10-CM | POA: Diagnosis not present

## 2021-07-11 DIAGNOSIS — E119 Type 2 diabetes mellitus without complications: Secondary | ICD-10-CM

## 2021-07-11 DIAGNOSIS — Z7982 Long term (current) use of aspirin: Secondary | ICD-10-CM | POA: Diagnosis not present

## 2021-07-11 DIAGNOSIS — I255 Ischemic cardiomyopathy: Secondary | ICD-10-CM | POA: Diagnosis present

## 2021-07-11 DIAGNOSIS — N400 Enlarged prostate without lower urinary tract symptoms: Secondary | ICD-10-CM | POA: Diagnosis present

## 2021-07-11 DIAGNOSIS — I442 Atrioventricular block, complete: Secondary | ICD-10-CM | POA: Diagnosis not present

## 2021-07-11 DIAGNOSIS — I110 Hypertensive heart disease with heart failure: Secondary | ICD-10-CM | POA: Diagnosis not present

## 2021-07-11 DIAGNOSIS — E785 Hyperlipidemia, unspecified: Secondary | ICD-10-CM | POA: Diagnosis present

## 2021-07-11 DIAGNOSIS — I25118 Atherosclerotic heart disease of native coronary artery with other forms of angina pectoris: Secondary | ICD-10-CM | POA: Diagnosis not present

## 2021-07-11 DIAGNOSIS — R079 Chest pain, unspecified: Secondary | ICD-10-CM | POA: Diagnosis not present

## 2021-07-11 DIAGNOSIS — Z7984 Long term (current) use of oral hypoglycemic drugs: Secondary | ICD-10-CM

## 2021-07-11 DIAGNOSIS — H401131 Primary open-angle glaucoma, bilateral, mild stage: Secondary | ICD-10-CM | POA: Diagnosis not present

## 2021-07-11 DIAGNOSIS — R55 Syncope and collapse: Secondary | ICD-10-CM | POA: Diagnosis not present

## 2021-07-11 DIAGNOSIS — Z20822 Contact with and (suspected) exposure to covid-19: Secondary | ICD-10-CM | POA: Diagnosis present

## 2021-07-11 DIAGNOSIS — I259 Chronic ischemic heart disease, unspecified: Secondary | ICD-10-CM | POA: Diagnosis not present

## 2021-07-11 DIAGNOSIS — I951 Orthostatic hypotension: Secondary | ICD-10-CM | POA: Diagnosis not present

## 2021-07-11 DIAGNOSIS — Z955 Presence of coronary angioplasty implant and graft: Secondary | ICD-10-CM

## 2021-07-11 DIAGNOSIS — Z7902 Long term (current) use of antithrombotics/antiplatelets: Secondary | ICD-10-CM

## 2021-07-11 DIAGNOSIS — Z7901 Long term (current) use of anticoagulants: Secondary | ICD-10-CM

## 2021-07-11 DIAGNOSIS — Z79899 Other long term (current) drug therapy: Secondary | ICD-10-CM | POA: Diagnosis not present

## 2021-07-11 DIAGNOSIS — I70229 Atherosclerosis of native arteries of extremities with rest pain, unspecified extremity: Secondary | ICD-10-CM | POA: Diagnosis present

## 2021-07-11 DIAGNOSIS — E1141 Type 2 diabetes mellitus with diabetic mononeuropathy: Secondary | ICD-10-CM | POA: Diagnosis not present

## 2021-07-11 DIAGNOSIS — Z8249 Family history of ischemic heart disease and other diseases of the circulatory system: Secondary | ICD-10-CM

## 2021-07-11 DIAGNOSIS — M549 Dorsalgia, unspecified: Secondary | ICD-10-CM | POA: Diagnosis not present

## 2021-07-11 DIAGNOSIS — E1136 Type 2 diabetes mellitus with diabetic cataract: Secondary | ICD-10-CM | POA: Diagnosis not present

## 2021-07-11 DIAGNOSIS — Z951 Presence of aortocoronary bypass graft: Secondary | ICD-10-CM

## 2021-07-11 DIAGNOSIS — R0902 Hypoxemia: Secondary | ICD-10-CM | POA: Diagnosis not present

## 2021-07-11 DIAGNOSIS — R4020 Unspecified coma: Secondary | ICD-10-CM | POA: Diagnosis not present

## 2021-07-11 DIAGNOSIS — L03115 Cellulitis of right lower limb: Secondary | ICD-10-CM | POA: Diagnosis not present

## 2021-07-11 DIAGNOSIS — I5042 Chronic combined systolic (congestive) and diastolic (congestive) heart failure: Secondary | ICD-10-CM | POA: Diagnosis present

## 2021-07-11 DIAGNOSIS — I213 ST elevation (STEMI) myocardial infarction of unspecified site: Secondary | ICD-10-CM | POA: Diagnosis present

## 2021-07-11 DIAGNOSIS — Z86711 Personal history of pulmonary embolism: Secondary | ICD-10-CM

## 2021-07-11 DIAGNOSIS — Z820 Family history of epilepsy and other diseases of the nervous system: Secondary | ICD-10-CM

## 2021-07-11 DIAGNOSIS — Z950 Presence of cardiac pacemaker: Secondary | ICD-10-CM | POA: Diagnosis not present

## 2021-07-11 DIAGNOSIS — D649 Anemia, unspecified: Secondary | ICD-10-CM | POA: Diagnosis not present

## 2021-07-11 DIAGNOSIS — N179 Acute kidney failure, unspecified: Secondary | ICD-10-CM | POA: Diagnosis not present

## 2021-07-11 DIAGNOSIS — Z8674 Personal history of sudden cardiac arrest: Secondary | ICD-10-CM

## 2021-07-11 DIAGNOSIS — M48061 Spinal stenosis, lumbar region without neurogenic claudication: Secondary | ICD-10-CM | POA: Diagnosis present

## 2021-07-11 DIAGNOSIS — I10 Essential (primary) hypertension: Secondary | ICD-10-CM | POA: Diagnosis present

## 2021-07-11 HISTORY — PX: TEMPORARY PACEMAKER: CATH118268

## 2021-07-11 LAB — TROPONIN I (HIGH SENSITIVITY)
Troponin I (High Sensitivity): 19 ng/L — ABNORMAL HIGH (ref <18)
Troponin I (High Sensitivity): 57 ng/L — ABNORMAL HIGH (ref <18)
Troponin I (High Sensitivity): 86 ng/L — ABNORMAL HIGH (ref <18)
Troponin I (High Sensitivity): 87 ng/L — ABNORMAL HIGH (ref <18)

## 2021-07-11 LAB — CBC WITH DIFFERENTIAL/PLATELET
Abs Immature Granulocytes: 0.05 10*3/uL (ref 0.00–0.07)
Basophils Absolute: 0.1 10*3/uL (ref 0.0–0.1)
Basophils Relative: 1 %
Eosinophils Absolute: 0.1 10*3/uL (ref 0.0–0.5)
Eosinophils Relative: 1 %
HCT: 41.1 % (ref 39.0–52.0)
Hemoglobin: 13.6 g/dL (ref 13.0–17.0)
Immature Granulocytes: 1 %
Lymphocytes Relative: 14 %
Lymphs Abs: 1.4 10*3/uL (ref 0.7–4.0)
MCH: 31.7 pg (ref 26.0–34.0)
MCHC: 33.1 g/dL (ref 30.0–36.0)
MCV: 95.8 fL (ref 80.0–100.0)
Monocytes Absolute: 0.9 10*3/uL (ref 0.1–1.0)
Monocytes Relative: 9 %
Neutro Abs: 7.5 10*3/uL (ref 1.7–7.7)
Neutrophils Relative %: 74 %
Platelets: 158 10*3/uL (ref 150–400)
RBC: 4.29 MIL/uL (ref 4.22–5.81)
RDW: 16.1 % — ABNORMAL HIGH (ref 11.5–15.5)
WBC: 9.9 10*3/uL (ref 4.0–10.5)
nRBC: 0 % (ref 0.0–0.2)

## 2021-07-11 LAB — COMPREHENSIVE METABOLIC PANEL
ALT: 26 U/L (ref 0–44)
AST: 28 U/L (ref 15–41)
Albumin: 3.8 g/dL (ref 3.5–5.0)
Alkaline Phosphatase: 121 U/L (ref 38–126)
Anion gap: 14 (ref 5–15)
BUN: 29 mg/dL — ABNORMAL HIGH (ref 8–23)
CO2: 21 mmol/L — ABNORMAL LOW (ref 22–32)
Calcium: 9.5 mg/dL (ref 8.9–10.3)
Chloride: 100 mmol/L (ref 98–111)
Creatinine, Ser: 1.85 mg/dL — ABNORMAL HIGH (ref 0.61–1.24)
GFR, Estimated: 39 mL/min — ABNORMAL LOW (ref >60)
Glucose, Bld: 139 mg/dL — ABNORMAL HIGH (ref 70–99)
Potassium: 4.2 mmol/L (ref 3.5–5.1)
Sodium: 135 mmol/L (ref 135–145)
Total Bilirubin: 0.9 mg/dL (ref 0.3–1.2)
Total Protein: 7.4 g/dL (ref 6.5–8.1)

## 2021-07-11 LAB — GLUCOSE, CAPILLARY
Glucose-Capillary: 86 mg/dL (ref 70–99)
Glucose-Capillary: 85 mg/dL (ref 70–99)

## 2021-07-11 LAB — BRAIN NATRIURETIC PEPTIDE: B Natriuretic Peptide: 218.5 pg/mL — ABNORMAL HIGH (ref 0.0–100.0)

## 2021-07-11 LAB — RESP PANEL BY RT-PCR (FLU A&B, COVID) ARPGX2
Influenza A by PCR: NEGATIVE
Influenza B by PCR: NEGATIVE
SARS Coronavirus 2 by RT PCR: NEGATIVE

## 2021-07-11 LAB — TSH: TSH: 4.8 u[IU]/mL — ABNORMAL HIGH (ref 0.350–4.500)

## 2021-07-11 LAB — PROTIME-INR
INR: 1.6 — ABNORMAL HIGH (ref 0.8–1.2)
Prothrombin Time: 19 seconds — ABNORMAL HIGH (ref 11.4–15.2)

## 2021-07-11 LAB — MAGNESIUM: Magnesium: 1.9 mg/dL (ref 1.7–2.4)

## 2021-07-11 LAB — HEPARIN LEVEL (UNFRACTIONATED): Heparin Unfractionated: >1.1 IU/mL — ABNORMAL HIGH (ref 0.30–0.70)

## 2021-07-11 LAB — APTT: aPTT: 34 seconds (ref 24–36)

## 2021-07-11 SURGERY — TEMPORARY PACEMAKER

## 2021-07-11 MED ORDER — RANOLAZINE ER 500 MG PO TB12
500.0000 mg | ORAL_TABLET | Freq: Two times a day (BID) | ORAL | Status: DC
  Administered 2021-07-11: 500 mg via ORAL
  Filled 2021-07-11 (×2): qty 1

## 2021-07-11 MED ORDER — CHLORHEXIDINE GLUCONATE CLOTH 2 % EX PADS: 6.0000 | MEDICATED_PAD | Freq: Every day | CUTANEOUS | Status: DC

## 2021-07-11 MED ORDER — SUCRALFATE 1 G PO TABS
1.0000 g | ORAL_TABLET | Freq: Three times a day (TID) | ORAL | Status: DC
  Administered 2021-07-11: 1 g via ORAL
  Filled 2021-07-11: qty 1

## 2021-07-11 MED ORDER — POLYETHYLENE GLYCOL 3350 17 G PO PACK: 17.0000 g | PACK | Freq: Every day | ORAL | Status: DC | PRN

## 2021-07-11 MED ORDER — MORPHINE SULFATE (PF) 2 MG/ML IV SOLN
INTRAVENOUS | Status: AC
  Administered 2021-07-11: 2 mg via INTRAVENOUS
  Filled 2021-07-11: qty 1

## 2021-07-11 MED ORDER — LIDOCAINE HCL 1 % IJ SOLN
INTRAMUSCULAR | Status: AC
  Filled 2021-07-11: qty 20

## 2021-07-11 MED ORDER — MIDAZOLAM HCL 2 MG/2ML IJ SOLN
INTRAMUSCULAR | Status: DC | PRN
  Administered 2021-07-11: 2 mg via INTRAVENOUS

## 2021-07-11 MED ORDER — LATANOPROST 0.005 % OP SOLN
1.0000 [drp] | Freq: Every day | OPHTHALMIC | Status: DC
  Filled 2021-07-11: qty 2.5

## 2021-07-11 MED ORDER — ONDANSETRON HCL 4 MG/2ML IJ SOLN
4.0000 mg | Freq: Four times a day (QID) | INTRAMUSCULAR | Status: DC | PRN
Start: 2021-07-11 — End: 2021-07-12

## 2021-07-11 MED ORDER — MIDAZOLAM HCL 2 MG/2ML IJ SOLN: 2.0000 mg | Freq: Once | INTRAMUSCULAR | Status: AC

## 2021-07-11 MED ORDER — HEPARIN (PORCINE) 25000 UT/250ML-% IV SOLN
1400.0000 [IU]/h | INTRAVENOUS | Status: DC
  Administered 2021-07-11: 1400 [IU]/h via INTRAVENOUS
  Filled 2021-07-11: qty 250

## 2021-07-11 MED ORDER — MORPHINE SULFATE (PF) 2 MG/ML IV SOLN
2.0000 mg | INTRAVENOUS | Status: DC | PRN
  Administered 2021-07-11: 2 mg via INTRAVENOUS
  Filled 2021-07-11: qty 1

## 2021-07-11 MED ORDER — CLOPIDOGREL BISULFATE 75 MG PO TABS: 75.0000 mg | ORAL_TABLET | Freq: Every day | ORAL | Status: DC

## 2021-07-11 MED ORDER — PANTOPRAZOLE SODIUM 40 MG PO TBEC: 40.0000 mg | DELAYED_RELEASE_TABLET | Freq: Every day | ORAL | Status: DC

## 2021-07-11 MED ORDER — ATORVASTATIN CALCIUM 20 MG PO TABS: 80.0000 mg | ORAL_TABLET | Freq: Every day | ORAL | Status: DC

## 2021-07-11 MED ORDER — INSULIN ASPART 100 UNIT/ML IJ SOLN: 0.0000 [IU] | Freq: Three times a day (TID) | INTRAMUSCULAR | Status: DC

## 2021-07-11 MED ORDER — MIDAZOLAM HCL 2 MG/2ML IJ SOLN
INTRAMUSCULAR | Status: AC
  Administered 2021-07-11: 2 mg via INTRAVENOUS
  Filled 2021-07-11: qty 2

## 2021-07-11 MED ORDER — ACETAMINOPHEN 325 MG PO TABS
650.0000 mg | ORAL_TABLET | Freq: Four times a day (QID) | ORAL | Status: DC | PRN
  Administered 2021-07-11: 650 mg via ORAL
  Filled 2021-07-11: qty 2

## 2021-07-11 MED ORDER — GLIPIZIDE ER 10 MG PO TB24: 10.0000 mg | ORAL_TABLET | Freq: Every morning | ORAL | Status: DC

## 2021-07-11 MED ORDER — MIDAZOLAM HCL 2 MG/2ML IJ SOLN
INTRAMUSCULAR | Status: AC
  Filled 2021-07-11: qty 2

## 2021-07-11 MED ORDER — RISAQUAD PO CAPS
2.0000 | ORAL_CAPSULE | Freq: Three times a day (TID) | ORAL | Status: DC
  Administered 2021-07-11: 2 via ORAL
  Filled 2021-07-11 (×2): qty 2

## 2021-07-11 MED ORDER — FOLIC ACID 1 MG PO TABS: 1.0000 mg | ORAL_TABLET | Freq: Every day | ORAL | Status: DC

## 2021-07-11 MED ORDER — ONDANSETRON HCL 4 MG/2ML IJ SOLN: 4.0000 mg | Freq: Four times a day (QID) | INTRAMUSCULAR | Status: DC | PRN

## 2021-07-11 MED ORDER — THIAMINE HCL 100 MG PO TABS: 100.0000 mg | ORAL_TABLET | Freq: Every day | ORAL | Status: DC

## 2021-07-11 MED ORDER — HEPARIN (PORCINE) IN NACL 1000-0.9 UT/500ML-% IV SOLN
INTRAVENOUS | Status: DC | PRN
  Administered 2021-07-11: 500 mL

## 2021-07-11 MED ORDER — ACETAMINOPHEN 650 MG RE SUPP
650.0000 mg | Freq: Four times a day (QID) | RECTAL | Status: DC | PRN
Start: 2021-07-11 — End: 2021-07-12
  Filled 2021-07-11: qty 1

## 2021-07-11 MED ORDER — MORPHINE SULFATE (PF) 2 MG/ML IV SOLN: 2.0000 mg | Freq: Once | INTRAVENOUS | Status: AC

## 2021-07-11 MED ORDER — LIDOCAINE HCL (PF) 1 % IJ SOLN
INTRAMUSCULAR | Status: DC | PRN
  Administered 2021-07-11: 20 mL

## 2021-07-11 MED ORDER — ONDANSETRON HCL 4 MG PO TABS
4.0000 mg | ORAL_TABLET | Freq: Four times a day (QID) | ORAL | Status: DC | PRN
Start: 2021-07-11 — End: 2021-07-12

## 2021-07-11 MED ORDER — BRIMONIDINE TARTRATE 0.2 % OP SOLN
1.0000 [drp] | Freq: Two times a day (BID) | OPHTHALMIC | Status: DC
  Administered 2021-07-11: 1 [drp] via OPHTHALMIC
  Filled 2021-07-11: qty 5

## 2021-07-11 MED ORDER — DULOXETINE HCL 60 MG PO CPEP
60.0000 mg | ORAL_CAPSULE | Freq: Two times a day (BID) | ORAL | Status: DC
  Filled 2021-07-11: qty 1

## 2021-07-11 MED ORDER — SODIUM CHLORIDE 0.9 % IV SOLN
INTRAVENOUS | Status: DC | PRN
  Administered 2021-07-11: 89 mL/h via INTRAVENOUS
  Administered 2021-07-11: 250 mL via INTRAVENOUS

## 2021-07-11 MED ORDER — GUAIFENESIN ER 600 MG PO TB12
600.0000 mg | ORAL_TABLET | Freq: Two times a day (BID) | ORAL | Status: DC
  Administered 2021-07-11: 600 mg via ORAL
  Filled 2021-07-11: qty 1

## 2021-07-11 MED ORDER — ISOSORBIDE MONONITRATE ER 30 MG PO TB24: 30.0000 mg | ORAL_TABLET | Freq: Every day | ORAL | Status: DC

## 2021-07-11 MED ORDER — LORAZEPAM 2 MG/ML IJ SOLN: 1.0000 mg | INTRAMUSCULAR | Status: DC | PRN

## 2021-07-11 MED ORDER — INSULIN ASPART 100 UNIT/ML IJ SOLN: 0.0000 [IU] | Freq: Every day | INTRAMUSCULAR | Status: DC

## 2021-07-11 MED ORDER — SODIUM CHLORIDE 0.9 % IV SOLN: INTRAVENOUS | Status: DC

## 2021-07-11 SURGICAL SUPPLY — 10 items
CABLE ADAPT PACING TEMP 12FT (ADAPTER) ×2 IMPLANT
DRAPE BRACHIAL (DRAPES) ×2 IMPLANT
GUIDEWIRE INQWIRE 1.5J.035X260 (WIRE) IMPLANT
INQWIRE 1.5J .035X260CM (WIRE)
KIT MICROPUNCTURE NIT STIFF (SHEATH) ×2 IMPLANT
PACK CARDIAC CATH (CUSTOM PROCEDURE TRAY) ×2 IMPLANT
SHEATH AVANTI 6FR X 11CM (SHEATH) ×2 IMPLANT
SLEEVE REPOSITIONING LENGTH 30 (MISCELLANEOUS) ×2 IMPLANT
SUT SILK 0 FSL (SUTURE) ×2 IMPLANT
WIRE PACING TEMP ST TIP 5 (CATHETERS) ×2 IMPLANT

## 2021-07-11 NOTE — Discharge Summary (Signed)
Physician Discharge Summary  Damon English QGB:[id number] DOB: 1951-10-05 DOA: 07/11/2021  PCP: [name], PA-C  Admit date: 07/11/2021 Discharge date: 07/11/2021  Admitted From: Home Disposition: [facility]   Recommendations for Outpatient Follow-up:  Follow up with PCP in 1-2 weeks Please obtain BMP/CBC in one week Please follow up on the following pending results:  Home Health: No  Equipment/Devices: Temporary pacemaker  Discharge Condition: Guarded  CODE STATUS: Full  Diet recommendation: NPO  Brief/Interim Summary:  Please refer to H&P, nursing notes, cardiology consultation notes, procedure notes for full hospital course.  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  Patient was found to have symptomatic bradycardia taken emergently to cardiac cath unit and temporary pacemaker was placed with pacing goals of 60 bpm.  Cardiology service recommends transfer to [facility] for EP specialist and ICD placement.  Plavix was resumed.  Aspirin was held.  Patient had taken Eliquis in the AM prior to presentation to the emergency department.  He was started on heparin GTT was started at 10 PM on day of admission.  Eliquis will be held at this time in anticipation of ICD.  Echocardiogram was not ordered as patient is being transferred to [facility].  Patient has history of unprovoked DVT, is currently on Eliquis 5 mg twice daily as an outpatient medication.  This medication has been held/discontinued on this discharge for transfer to [facility].  This discontinuation is temporarily in anticipation of  ICD placement at [facility].  Accepting facility, [facility], to resume home medications as appropriate on their discharge.  Discharge Diagnoses:  Principal Problem:   Syncope and collapse Active Problems:   BPH (benign prostatic hyperplasia)   CAD (coronary artery disease)   Chronic systolic CHF (congestive heart failure) (HCC)   History of anxiety   GERD (gastroesophageal reflux disease)   HTN (hypertension)   Hx of cardiac arrest   Ischemic cardiomyopathy   Hyperlipidemia   Type 2 diabetes mellitus (HCC)   S/P CABG x 3   Spinal stenosis of lumbar region at multiple levels   Atherosclerosis of native arteries of extremity with rest pain (HCC)   Cardiac arrest ([place])   ETOH abuse   AKI (acute kidney injury) ([place])   STEMI (ST elevation myocardial infarction) ([place])   Symptomatic bradycardia   AV block, 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[place])   RBBB  Discharge Instructions  Discharge Instructions     Diet - low sodium heart healthy   Complete by: As directed    Increase activity slowly   Complete by: As directed    No wound care   Complete by: As directed       Allergies as of 07/11/2021       Reactions   Amoxicillin Other (See Comments)   Abdominal cramps Other reaction(s): Other (See Comments) Abdominal cramps Tolerated ancef 7/16   Hydromorphone Hcl    Quick & severe nausea/vomiting  Other reaction(s): Vomiting Quick & severe nausea/vomiting    Aspirin    Other reaction(s): Other (See Comments) He has likely cerebral amyloid.  Is on NOAC for now.  No ASA with it. Not allergic per patient.  Medication List     STOP taking these medications    apixaban 5 MG Tabs tablet Commonly known as: ELIQUIS   cephALEXin 500 MG capsule Commonly known as: KEFLEX   diphenhydrAMINE 25 mg capsule Commonly known as: BENADRYL   doxycycline 100 MG tablet Commonly known as: VIBRA-TABS   glipiZIDE 10 MG 24 hr tablet Commonly known as: GLUCOTROL XL   heparin 25000 UT/250ML infusion    HYDROcodone-acetaminophen 5-325 MG tablet Commonly known as: NORCO/VICODIN   insulin aspart 100 UNIT/ML injection Commonly known as: novoLOG   losartan 50 MG tablet Commonly known as: COZAAR   metoprolol succinate 50 MG 24 hr tablet Commonly known as: TOPROL-XL   midodrine 2.5 MG tablet Commonly known as: PROAMATINE   pantoprazole 40 MG injection Commonly known as: PROTONIX   ramipril 2.5 MG capsule Commonly known as: ALTACE   sodium chloride 0.9 % infusion   sodium chloride flush 0.9 % Soln Commonly known as: NS   spironolactone 25 MG tablet Commonly known as: ALDACTONE       TAKE these medications    acetaminophen 325 MG tablet Commonly known as: TYLENOL Take 2 tablets (650 mg total) by mouth every 6 (six) hours as needed for fever. What changed: Another medication with the same name was removed. Continue taking this medication, and follow the directions you see here.   acidophilus Caps capsule Take 2 capsules by mouth 3 (three) times daily.   atorvastatin 80 MG tablet Commonly known as: LIPITOR Take 80 mg by mouth daily.   brimonidine 0.2 % ophthalmic solution Commonly known as: ALPHAGAN Place 1 drop into both eyes 2 (two) times daily.   Chlorhexidine Gluconate Cloth 2 % Pads Apply 6 each topically daily.   clonazePAM 0.25 MG disintegrating tablet Commonly known as: KLONOPIN Take 1-2 tablets (0.25-0.5 mg total) by mouth 3 (three) times daily as needed (anxiety/agitation).   clopidogrel 75 MG tablet Commonly known as: PLAVIX Take 1 tablet (75 mg total) by mouth daily.   diclofenac Sodium 1 % Gel Commonly known as: VOLTAREN Apply 4 g topically 4 (four) times daily.   docusate sodium 100 MG capsule Commonly known as: COLACE Take 1 capsule (100 mg total) by mouth 2 (two) times daily.   DULoxetine 60 MG capsule Commonly known as: CYMBALTA Take 1 capsule (60 mg total) by mouth 2 (two) times daily.   Enstilar 0.005-0.064 % Foam Generic drug:  Calcipotriene-Betameth Diprop APPLY THIN LAYER TO AFFECTED AREAS ONCE DAILY UNTIL FLAT AND ASYMPTOMATIC, MAX 4 WEEKS   esomeprazole 40 MG capsule Commonly known as: NEXIUM Take 1 capsule by mouth daily.   famotidine 40 MG tablet Commonly known as: PEPCID Take 1 tablet by mouth every morning.   folic acid 1 MG tablet Commonly known as: FOLVITE Take 1 tablet (1 mg total) by mouth daily.   furosemide 20 MG tablet Commonly known as: LASIX Take 40 mg by mouth daily.   guaiFENesin 600 MG 12 hr tablet Commonly known as: MUCINEX Take 1 tablet (600 mg total) by mouth 2 (two) times daily.   isosorbide mononitrate 30 MG 24 hr tablet Commonly known as: IMDUR Take 1 tablet (30 mg total) by mouth daily.   ketoconazole 2 % shampoo Commonly known as: NIZORAL Apply topically.   latanoprost 0.005 % ophthalmic solution Commonly known as: XALATAN Place 1 drop into both eyes at bedtime.   Magnesium 250 MG Tabs Take by mouth.   multivitamin with minerals Tabs tablet Take 1 tablet by mouth daily.  nitroGLYCERIN 0.4 MG SL tablet Commonly known as: NITROSTAT Place 1 tablet (0.4 mg total) under the tongue every 5 (five) minutes as needed for chest pain.   omeprazole 40 MG capsule Commonly known as: PRILOSEC Take 40 mg by mouth daily.   ondansetron 4 MG/2ML Soln injection Commonly known as: ZOFRAN Inject 2 mLs (4 mg total) into the vein every 6 (six) hours as needed for nausea.   phenol 1.4 % Liqd Commonly known as: CHLORASEPTIC Use as directed 1 spray in the mouth or throat as needed for throat irritation / pain.   polyethylene glycol 17 g packet Commonly known as: MIRALAX / GLYCOLAX Take 17 g by mouth daily as needed for mild constipation or moderate constipation.   ranolazine 500 MG 12 hr tablet Commonly known as: RANEXA Take 500 mg by mouth 2 (two) times daily.   SENNOSIDES PO Take by mouth.   sucralfate 1 g tablet Commonly known as: CARAFATE Take 1 tablet by mouth 4  (four) times daily -  before meals and at bedtime.   thiamine 100 MG tablet Take 1 tablet (100 mg total) by mouth daily.       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  Is on NOAC for now.  No ASA with it. Not allergic per patient.    Consultations: - Cardiology service  Procedures/Studies: Patient received temporary pacemaker placement  Subjective: no changes from my evaluation   Discharge Exam: Vitals:   07/11/21 2200 07/11/21 2300  BP: 123/66 (!) 145/62  Pulse: (!) 58 60  Resp: 15 12  Temp:    SpO2: 96% 95%   Vitals:   07/11/21 2000 07/11/21 2100 07/11/21 2200 07/11/21 2300  BP: 120/63 129/71 123/66 (!) 145/62  Pulse: 60 60 (!) 58 60  Resp: 14 14 15 12   Temp:      TempSrc:      SpO2: 98% 97% 96% 95%  Weight:      Height:       General: Pt is alert, awake, not in acute distress Cardiovascular: RRR, S1/S2 +, no rubs, no gallops Respiratory: CTA bilaterally, no wheezing, no rhonchi Abdominal: Soft, NT, ND, bowel sounds + Extremities: no edema, no cyanosis  The results of significant diagnostics from this hospitalization (including imaging, microbiology, ancillary and laboratory) are listed below for reference.    Microbiology: Recent Results (from the past 240 hour(s))  Resp Panel by RT-PCR (Flu A&B, Covid) Nasopharyngeal Swab     Status: None   Collection Time: 07/11/21  1:57 PM   Specimen: Nasopharyngeal Swab; Nasopharyngeal(NP) swabs in vial transport medium  Result Value Ref Range Status   SARS Coronavirus 2 by RT PCR NEGATIVE NEGATIVE Final    Comment: (NOTE) SARS-CoV-2 target nucleic acids are NOT DETECTED.  The SARS-CoV-2  RNA is generally detectable in upper respiratory specimens during the acute phase of infection. The lowest concentration of SARS-CoV-2 viral copies this assay can detect is 138 copies/mL. A negative result does not preclude SARS-Cov-2 infection and should not be used as the sole basis for treatment or other patient management decisions. A negative result may occur with  improper specimen collection/handling, submission of specimen other than  nasopharyngeal swab, presence of viral mutation(s) within the areas targeted by this assay, and inadequate number of viral copies(<138 copies/mL). A negative result must be combined with clinical observations, patient history, and epidemiological information. The expected result is Negative.  Fact Sheet for Patients:  EntrepreneurPulse.com.au  Fact Sheet for Healthcare Providers:  IncredibleEmployment.be  This test is no t yet approved or cleared by the [place] FDA and  has been authorized for detection and/or diagnosis of SARS-CoV-2 by FDA under an Emergency Use Authorization (EUA). This EUA will remain  in effect (meaning this test can be used) for the duration of the COVID-19 declaration under Section 564(b)(1) of the Act, 21 U.S.C.section 360bbb-3(b)(1), unless the authorization is terminated  or revoked sooner.       Influenza A by PCR NEGATIVE NEGATIVE Final   Influenza B by PCR NEGATIVE NEGATIVE Final    Comment: (NOTE) The Xpert Xpress SARS-CoV-2/FLU/RSV plus assay is intended as an aid in the diagnosis of influenza from Nasopharyngeal swab specimens and should not be used as a sole basis for treatment. Nasal washings and aspirates are unacceptable for Xpert Xpress SARS-CoV-2/FLU/RSV testing.  Fact Sheet for Patients: EntrepreneurPulse.com.au  Fact Sheet for Healthcare Providers: IncredibleEmployment.be  This test is not yet approved or cleared by the  [place] FDA and has been authorized for detection and/or diagnosis of SARS-CoV-2 by FDA under an Emergency Use Authorization (EUA). This EUA will remain in effect (meaning this test can be used) for the duration of the COVID-19 declaration under Section 564(b)(1) of the Act, 21 U.S.C. section 360bbb-3(b)(1), unless the authorization is terminated or revoked.  Performed at [facility], [address] [place], [place] [address]     Labs: BNP (last 3 results) Recent Labs    07/11/21 1317  BNP 742.5*   Basic Metabolic Panel: Recent Labs  Lab 07/11/21 1317 07/11/21 1644  NA 135  --   K 4.2  --   CL 100  --   CO2 21*  --   GLUCOSE 139*  --   BUN 29*  --   CREATININE 1.85*  --   CALCIUM 9.5  --   MG  --  1.9   Liver Function Tests: Recent Labs  Lab 07/11/21 1317  AST 28  ALT 26  ALKPHOS 121  BILITOT 0.9  PROT 7.4  ALBUMIN 3.8   CBC: Recent Labs  Lab 07/11/21 1317  WBC 9.9  NEUTROABS 7.5  HGB 13.6  HCT 41.1  MCV 95.8  PLT 158   Cardiac Enzymes: No results for input(s): CKTOTAL, CKMB, CKMBINDEX, TROPONINI in the last 168 hours.  BNP: Invalid input(s): POCBNP  CBG: Recent Labs  Lab 07/11/21 1659 07/11/21 2219  GLUCAP 86 85   Thyroid function studies Recent Labs    07/11/21 1644  TSH 4.800*   Urinalysis    Component Value Date/Time   COLORURINE YELLOW (A) 06/18/2021 1731   APPEARANCEUR CLEAR (A) 06/18/2021 1731   LABSPEC 1.015 06/18/2021 1731   PHURINE 8.0 06/18/2021 1731   GLUCOSEU 100 (A) 06/18/2021 1731   HGBUR SMALL (A) 06/18/2021 1731   BILIRUBINUR NEGATIVE 06/18/2021 1731   KETONESUR NEGATIVE 06/18/2021 1731   PROTEINUR 100 (A) 06/18/2021 1731   NITRITE NEGATIVE 06/18/2021 1731   LEUKOCYTESUR NEGATIVE 06/18/2021 1731   Sepsis Labs Invalid input(s): PROCALCITONIN,  WBC,  LACTICIDVEN Microbiology Recent Results (from the past 240 hour(s))  Resp Panel by RT-PCR (Flu A&B, Covid) Nasopharyngeal Swab     Status: None  Collection Time: 07/11/21  1:57 PM   Specimen: Nasopharyngeal Swab; Nasopharyngeal(NP) swabs in vial transport medium  Result Value Ref Range Status   SARS Coronavirus 2 by RT PCR NEGATIVE NEGATIVE Final    Comment: (NOTE) SARS-CoV-2 target nucleic acids are NOT DETECTED.  The SARS-CoV-2 RNA is generally detectable in upper respiratory specimens during the acute phase of infection. The lowest concentration of SARS-CoV-2 viral copies this assay can detect is 138 copies/mL. A negative result does not preclude SARS-Cov-2 infection and should not be used as the sole basis for treatment or other patient management decisions. A negative result may occur with  improper specimen collection/handling, submission of specimen other than nasopharyngeal swab, presence of viral mutation(s) within the areas targeted by this assay, and inadequate number of viral copies(<138 copies/mL). A negative result must be combined with clinical observations, patient history, and epidemiological information. The expected result is Negative.  Fact Sheet for Patients:  EntrepreneurPulse.com.au  Fact Sheet for Healthcare Providers:  IncredibleEmployment.be  This test is no t yet approved or cleared by the [place] FDA and  has been authorized for detection and/or diagnosis of SARS-CoV-2 by FDA under an Emergency Use Authorization (EUA). This EUA will remain  in effect (meaning this test can be used) for the duration of the COVID-19 declaration under Section 564(b)(1) of the Act, 21 U.S.C.section 360bbb-3(b)(1), unless the authorization is terminated  or revoked sooner.       Influenza A by PCR NEGATIVE NEGATIVE Final   Influenza B by PCR NEGATIVE NEGATIVE Final    Comment: (NOTE) The Xpert Xpress SARS-CoV-2/FLU/RSV plus assay is intended as an aid in the diagnosis of influenza from Nasopharyngeal swab specimens and should not be used as a sole basis for treatment.  Nasal washings and aspirates are unacceptable for Xpert Xpress SARS-CoV-2/FLU/RSV testing.  Fact Sheet for Patients: EntrepreneurPulse.com.au  Fact Sheet for Healthcare Providers: IncredibleEmployment.be  This test is not yet approved or cleared by the [place] FDA and has been authorized for detection and/or diagnosis of SARS-CoV-2 by FDA under an Emergency Use Authorization (EUA). This EUA will remain in effect (meaning this test can be used) for the duration of the COVID-19 declaration under Section 564(b)(1) of the Act, 21 U.S.C. section 360bbb-3(b)(1), unless the authorization is terminated or revoked.  Performed at [facility], [address] [place], [place] [address]    Time coordinating discharge: Over 30 minutes  SIGNED:  Dr. Tobie Poet  [facility] 07/11/2021, 11:17 PM Pager   If 7AM-7PM, please contact day-coverage [email or web address]

## 2021-07-11 NOTE — Consult Note (Signed)
[facility] Cardiology  CARDIOLOGY CONSULT NOTE  Patient ID: [name] MRN: [id number] DOB/AGE: September 23, 1951 70 y.o.  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   Following his recent discharge from [facility] he has felt quite fatigued, but has not had any chest pain. He reports eating and drinking normally. He has not had any chest pain or recent angina, which is an improvement compared to prior to his hospitalization. This morning he was in the bathroom and had a syncopal episode, hitting his head. He awoke and called for help from his wife. BP was normal, but HR in 30's. She called EMS, and initially his HR was in the 40's, but decreased to the 10's at times. At [facility] ED his rate has been variable, and recorded BP was elevated; after arrival HR dropped to the 10's and resulted in syncope. He is now being externally paced.   EKG shows RBBB (Chronic). Unclear P wave activity. Likely CHB with slow ventricular escape.   Review of systems complete and found to be negative unless listed above     Past Medical History:  Diagnosis Date   Alcohol use    "cutting back" but still heavy and daily, multiple shots of bourbon each night   Diabetes mellitus without complication ([place])    History of tobacco use    reportedly quit in 2013   Hypertension    Pulmonary embolism ([place]) 2012   unprovoked   PVD (peripheral vascular disease) ([place])     Past Surgical History:  Procedure Laterality Date   BACK SURGERY     CHOLECYSTECTOMY     CORONARY ANGIOGRAPHY N/A 01/30/2019    Procedure: CORONARY ANGIOGRAPHY;  Surgeon: Corey Skains, MD;  Location: [place] [facility];  Service: Cardiovascular;  Laterality: N/A;   ERCP W/ SPHICTEROTOMY  2015   FEMORAL ARTERY - POPLITEAL ARTERY BYPASS GRAFT Right 06/19/2018   LEFT HEART CATH AND CORS/GRAFTS ANGIOGRAPHY N/A 06/25/2021   Procedure: LEFT HEART CATH AND CORS/GRAFTS ANGIOGRAPHY;  Surgeon: Andrez Grime, MD;  Location: [place] [facility];  Service: Cardiovascular;  Laterality: N/A;   LOWER EXTREMITY ANGIOGRAPHY Left 01/14/2021   Procedure: LOWER EXTREMITY ANGIOGRAPHY;  Surgeon: Algernon Huxley, MD;  Location: [place] [facility];  Service: Cardiovascular;  Laterality: Left;   THROMBOENDARTERECTOMY Right 06/19/2018   TONSILLECTOMY     VASCULAR SURGERY      (Not in a hospital admission)  Social History   Socioeconomic History   Marital status: Married    Spouse name: Not on file   Number of children: Not on file   Years of education: Not on file   Highest education level: Not on file  Occupational History   Not on file  Tobacco Use   Smoking status: Former    Packs/day: 2.00    Years: 40.00    Pack years: 80.00    Types: Cigarettes    Quit date: 11/27/2010    Years since quitting: 10.6   Smokeless tobacco: Never   Tobacco comments:    Quit in 2012  Substance and Sexual Activity   Alcohol use:  Yes    Alcohol/week: 7.0 standard drinks    Types: 7 Shots of liquor per week   Drug use: Never   Sexual activity: Not on file  Other Topics Concern   Not on file  Social History Narrative   Not on file   Social Determinants of Health   Financial Resource Strain: Not on file  Food Insecurity: Not on file  Transportation Needs: Not on file  Physical Activity: Not on file  Stress: Not on file  Social Connections: Not on file  Intimate Partner Violence: Not on file    Family History  Problem Relation Age of Onset   Hypertension Mother    Parkinson's disease Mother    Heart attack Father     Vascular Disease Father    AAA (abdominal aortic aneurysm) Father    Alzheimer's disease Father       Review of systems complete and found to be negative unless listed above      PHYSICAL EXAM  General: Well developed, well nourished, in no acute distress HEENT:  Normocephalic and atramatic Neck:  No JVD.  Lungs: Clear bilaterally to auscultation and percussion. Heart: HRRR . Normal S1 and S2 without gallops or murmurs.  Abdomen: Bowel sounds are positive, abdomen soft and non-tender  Msk:  Back normal, normal gait. Normal strength and tone for age. Extremities: No clubbing, cyanosis or edema.   Neuro: Alert and oriented X 3. Psych:  Good affect, responds appropriately  Labs:   Lab Results  Component Value Date   WBC 9.9 07/11/2021   HGB 13.6 07/11/2021   HCT 41.1 07/11/2021   MCV 95.8 07/11/2021   PLT 158 07/11/2021    Recent Labs  Lab 07/11/21 1317  NA 135  K 4.2  CL 100  CO2 21*  BUN 29*  CREATININE 1.85*  CALCIUM 9.5  PROT 7.4  BILITOT 0.9  ALKPHOS 121  ALT 26  AST 28  GLUCOSE 139*   Lab Results  Component Value Date   TROPONINI 2.77 (HH) 01/29/2019    Lab Results  Component Value Date   CHOL 143 01/29/2019   CHOL 168 01/28/2019   Lab Results  Component Value Date   HDL 33 (L) 01/29/2019   HDL 36 (L) 01/28/2019   Lab Results  Component Value Date   LDLCALC 93 01/29/2019   LDLCALC 113 (H) 01/28/2019   Lab Results  Component Value Date   TRIG 87 01/29/2019   TRIG 97 01/28/2019   Lab Results  Component Value Date   CHOLHDL 4.3 01/29/2019   CHOLHDL 4.7 01/28/2019   No results found for: LDLDIRECT    Radiology: DG Abdomen 1 View  Result Date: 06/18/2021 CLINICAL DATA:  Orogastric tube placement EXAM: ABDOMEN - 1 VIEW COMPARISON:  6:26 p.m. FINDINGS: Orogastric tube tip now overlies the gastric fundus. Visualized lung bases are clear. No free intraperitoneal gas. The abdominal gas pattern is indeterminate due to a paucity of  intra-abdominal gas. Pelvis excluded from view. IMPRESSION: Orogastric tube tip within the gastric fundus. Electronically Signed   By: Fidela Salisbury M.D.   On: 06/18/2021 21:15   DG Abd 1 View  Result Date: 06/18/2021 CLINICAL DATA:  Encounter for NG tube placement EXAM: ABDOMEN - 1 VIEW COMPARISON:  None. FINDINGS: Defibrillator pads overlie the upper abdomen. There is no evidence of bowel obstruction. There is no visible NG tube. The NG tube appears to be overlying the midesophagus on separately dictated chest radiograph. Prior L3-L5 fusion. No acute  osseous abnormality. Bilateral iliac stents IMPRESSION: No visible nasogastric tube overlying the abdomen. See separately dictated chest radiograph. Electronically Signed   By: Maurine Simmering M.D.   On: 06/18/2021 18:53   CT HEAD WO CONTRAST (5MM)  Result Date: 06/18/2021 CLINICAL DATA:  Mental status change.  Patient intubated EXAM: CT HEAD WITHOUT CONTRAST TECHNIQUE: Contiguous axial images were obtained from the base of the skull through the vertex without intravenous contrast. COMPARISON:  None. FINDINGS: Brain: Hypodensity left parietal lobe involving cortex and white matter compatible with chronic infarct. Patchy white matter hypodensity bilaterally likely chronic. Negative for hydrocephalus. Negative for acute infarct, hemorrhage or mass. 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 Electronically Signed   By: Franchot Gallo M.D.   On: 06/18/2021 16:20   CT Angio Chest PE W and/or Wo Contrast  Addendum Date: 06/19/2021   ADDENDUM REPORT: 06/19/2021 10:11 ADDENDUM: Upon further review, there is a mildly displaced acute fracture of the right 6th rib anteriorly (image 64/6). In addition, there are possible nondisplaced fractures the right anterior 4th, 5th and 7th ribs. There is also  a probable fracture of the left 6th rib anteriorly. No pneumothorax or pleural effusion. These results were discussed by telephone at the time of interpretation on 06/19/2021 at 09:55 am with provider Derrill Kay, who verbally acknowledged these results. Electronically Signed   By: Richardean Sale M.D.   On: 06/19/2021 10:11   Result Date: 06/19/2021 CLINICAL DATA:  PE suspected, high probability.  Intubated patient. EXAM: CT ANGIOGRAPHY CHEST WITH CONTRAST TECHNIQUE: Multidetector CT imaging of the chest was performed using the standard protocol during bolus administration of intravenous contrast. Multiplanar CT image reconstructions and MIPs were obtained to evaluate the vascular anatomy. CONTRAST:  35mL OMNIPAQUE IOHEXOL 350 MG/ML SOLN COMPARISON:  Radiographs 01/28/2019. FINDINGS: Cardiovascular: The pulmonary arteries are well opacified with contrast to the level of the subsegmental branches. There is no evidence of acute pulmonary embolism. There is essentially no contrast opacification of the systemic arterial system. Patient is status post median sternotomy and CABG. There is diffuse atherosclerosis of the aorta, great vessels and coronary arteries. There is some reflux of contrast into the IVC and hepatic veins. The heart size is normal. There is no pericardial effusion. Mediastinum/Nodes: Tip of the endotracheal tube is in the mid trachea. There are no enlarged mediastinal, hilar or axillary lymph nodes. The thyroid gland, trachea and esophagus demonstrate no significant findings. Lungs/Pleura: There is no pleural effusion. Mild centrilobular emphysema with dependent pulmonary opacities bilaterally, favoring atelectasis. No consolidation or suspicious pulmonary nodularity. Triangular-shaped 4 mm right middle lobe nodule on image 47/6, likely benign. Upper abdomen: As above, there is some reflux of contrast into the IVC and hepatic veins. The liver contours are mildly irregular. The gallbladder is  surgically absent. Musculoskeletal/Chest wall: There is no chest wall mass or suspicious osseous finding. Previous median sternotomy. Multilevel thoracic spondylosis. Review of the MIP images confirms the above findings. IMPRESSION: 1. No evidence of acute pulmonary embolism or other acute chest process. 2. Diffuse coronary and Aortic Atherosclerosis (ICD10-I70.0). Previous CABG. 3. Mild emphysema with probable mild dependent atelectasis in both lungs. 4. Potential changes of early cirrhosis. Electronically Signed: By: Richardean Sale M.D. On: 06/18/2021 16:20   CARDIAC CATHETERIZATION  Result Date: 06/25/2021   Colon Flattery Cx to Prox Cx lesion is 50% stenosed.   Prox RCA to Mid  RCA lesion is 50% stenosed.   Dist RCA lesion is 35% stenosed.   Dist LM to Ost LAD lesion is 100% stenosed.   Dist Graft to Insertion lesion is 75% stenosed.   Ost LM to Mid LM lesion is 25% stenosed.   Ost 2nd Mrg lesion is 50% stenosed.   Origin to Prox Graft lesion is 100% stenosed.   LV end diastolic pressure is normal.   There is no aortic valve stenosis. Conclusion Severe native three-vessel coronary artery disease including CTO of the LAD.  50% mid LCx, and serial 50% lesions in the RCA. Patent SVG to RPDA Occluded to SVG to OM 2 (previously long 99% ostial to mid lesion) Patent LIMA to LAD. There is a 75% lesion within the previously placed stent at the anastomosis of the LIMA to LAD. Recommendation: Transfer patient to tertiary care center for PCI to LIMA/LAD ISR. Hold Eliquis over the weekend. Start heparin 4 hrs after TR band removal. Continue plavix as previously prescribed.   CT FEMUR RIGHT WO CONTRAST  Result Date: 06/18/2021 CLINICAL DATA:  Recent insect bite with fever and right lower extremity pain and edema. EXAM: CT OF THE RIGHT FEMUR, LOWER LEG AND FOOT WITHOUT CONTRAST TECHNIQUE: Multidetector CT imaging of the right lower extremity was performed according to the standard protocol. The images extend from the lower right  pelvis through the right foot. Multiplanar CT image reconstructions were also generated. COMPARISON:  None. FINDINGS: Bones/Joint/Cartilage No evidence of acute fracture or dislocation. Status post proximal right femoral dynamic plate and screw fixation of an intertrochanteric femur fracture. The hardware is intact without loosening. There is irregular periosteal ossification anteriorly, likely chronic and related to the original injury. The distal femur, tibia, fibula and foot demonstrate no significant findings. No large joint effusions are seen. Ligaments Suboptimally assessed by CT. Muscles and Tendons No focal muscular atrophy or fluid collection identified. The extensor mechanism is intact at the knee. The ankle tendons appear intact and normally located. Soft tissues Bilateral external iliac stents are noted with postsurgical changes from right femoropopliteal bypass grafting. There is extensive atherosclerosis of the underlying native arteries in the right lower extremity. Foley catheter is in place with mild bladder wall thickening. There is contrast in the bladder related to the preceding chest CTA. Postsurgical changes are present in the subcutaneous fat lateral to the proximal right femur, attributed to the previous ORIF. No inflammatory changes or fluid collections are seen within the thigh. There is diffuse subcutaneous edema within the lower leg, greatest distally. No focal fluid collection, soft tissue emphysema or foreign body identified. This edema extends into the foot. IMPRESSION: 1. Diffuse subcutaneous edema within the lower leg and foot consistent with soft tissue infection (cellulitis). No focal fluid collection or unexpected foreign body identified. 2. No evidence of deep soft tissue infection, septic arthritis or osteomyelitis. 3. Previous right proximal femoral ORIF without acute osseous findings. 4. Extensive atherosclerosis post right femoropopliteal bypass grafting. Electronically  Signed   By: Richardean Sale M.D.   On: 06/18/2021 20:52   CT TIBIA FIBULA RIGHT WO CONTRAST  Result Date: 06/18/2021 CLINICAL DATA:  Recent insect bite with fever and right lower extremity pain and edema. EXAM: CT OF THE RIGHT FEMUR, LOWER LEG AND FOOT WITHOUT CONTRAST TECHNIQUE: Multidetector CT imaging of the right lower extremity was performed according to the standard protocol. The images extend from the lower right pelvis through the right foot. Multiplanar CT image reconstructions were also generated. COMPARISON:  None.  FINDINGS: Bones/Joint/Cartilage No evidence of acute fracture or dislocation. Status post proximal right femoral dynamic plate and screw fixation of an intertrochanteric femur fracture. The hardware is intact without loosening. There is irregular periosteal ossification anteriorly, likely chronic and related to the original injury. The distal femur, tibia, fibula and foot demonstrate no significant findings. No large joint effusions are seen. Ligaments Suboptimally assessed by CT. Muscles and Tendons No focal muscular atrophy or fluid collection identified. The extensor mechanism is intact at the knee. The ankle tendons appear intact and normally located. Soft tissues Bilateral external iliac stents are noted with postsurgical changes from right femoropopliteal bypass grafting. There is extensive atherosclerosis of the underlying native arteries in the right lower extremity. Foley catheter is in place with mild bladder wall thickening. There is contrast in the bladder related to the preceding chest CTA. Postsurgical changes are present in the subcutaneous fat lateral to the proximal right femur, attributed to the previous ORIF. No inflammatory changes or fluid collections are seen within the thigh. There is diffuse subcutaneous edema within the lower leg, greatest distally. No focal fluid collection, soft tissue emphysema or foreign body identified. This edema extends into the foot.  IMPRESSION: 1. Diffuse subcutaneous edema within the lower leg and foot consistent with soft tissue infection (cellulitis). No focal fluid collection or unexpected foreign body identified. 2. No evidence of deep soft tissue infection, septic arthritis or osteomyelitis. 3. Previous right proximal femoral ORIF without acute osseous findings. 4. Extensive atherosclerosis post right femoropopliteal bypass grafting. Electronically Signed   By: Richardean Sale M.D.   On: 06/18/2021 20:52   CT FOOT RIGHT WO CONTRAST  Result Date: 06/18/2021 CLINICAL DATA:  Recent insect bite with fever and right lower extremity pain and edema. EXAM: CT OF THE RIGHT FEMUR, LOWER LEG AND FOOT WITHOUT CONTRAST TECHNIQUE: Multidetector CT imaging of the right lower extremity was performed according to the standard protocol. The images extend from the lower right pelvis through the right foot. Multiplanar CT image reconstructions were also generated. COMPARISON:  None. FINDINGS: Bones/Joint/Cartilage No evidence of acute fracture or dislocation. Status post proximal right femoral dynamic plate and screw fixation of an intertrochanteric femur fracture. The hardware is intact without loosening. There is irregular periosteal ossification anteriorly, likely chronic and related to the original injury. The distal femur, tibia, fibula and foot demonstrate no significant findings. No large joint effusions are seen. Ligaments Suboptimally assessed by CT. Muscles and Tendons No focal muscular atrophy or fluid collection identified. The extensor mechanism is intact at the knee. The ankle tendons appear intact and normally located. Soft tissues Bilateral external iliac stents are noted with postsurgical changes from right femoropopliteal bypass grafting. There is extensive atherosclerosis of the underlying native arteries in the right lower extremity. Foley catheter is in place with mild bladder wall thickening. There is contrast in the bladder related  to the preceding chest CTA. Postsurgical changes are present in the subcutaneous fat lateral to the proximal right femur, attributed to the previous ORIF. No inflammatory changes or fluid collections are seen within the thigh. There is diffuse subcutaneous edema within the lower leg, greatest distally. No focal fluid collection, soft tissue emphysema or foreign body identified. This edema extends into the foot. IMPRESSION: 1. Diffuse subcutaneous edema within the lower leg and foot consistent with soft tissue infection (cellulitis). No focal fluid collection or unexpected foreign body identified. 2. No evidence of deep soft tissue infection, septic arthritis or osteomyelitis. 3. Previous right proximal femoral ORIF without acute  osseous findings. 4. Extensive atherosclerosis post right femoropopliteal bypass grafting. Electronically Signed   By: Richardean Sale M.D.   On: 06/18/2021 20:52   NM Myocar Multi W/Spect Tamela Oddi Motion / EF  Result Date: 06/24/2021   Findings are consistent with ischemia. The study is high risk.   No ST deviation was noted.   LV perfusion is abnormal. There is evidence of ischemia. Defect 1: There is a large defect with severe reduction in uptake present in the apical to mid anterior and anteroseptal location(s) that is reversible. Consistent with ischemia.   Left ventricular function is abnormal. Nuclear stress EF: 47 %. The left ventricular ejection fraction is mildly decreased (45-54%). Conclusion: Abnormal high risk study There is a large, severe, mostly reversible defect involving the mid to apical anterior wall (LAD distribution) that is consistent with ischemia. Mildly decreased LV systolic function.   DG Chest Port 1 View  Result Date: 07/11/2021 CLINICAL DATA:  Chest pain, syncopal episodes, bradycardia EXAM: PORTABLE CHEST 1 VIEW COMPARISON:  06/21/2021 FINDINGS: Gross cardiomegaly status post median sternotomy and CABG. Defibrillator leads project over the chest. No acute  abnormality of the lungs. 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 No acute abnormality of the lungs. Electronically Signed   By: Eddie Candle M.D.   On: 07/11/2021 13:22   DG Chest Port 1 View  Result Date: 06/21/2021 CLINICAL DATA:  Cough. EXAM: PORTABLE CHEST 1 VIEW COMPARISON:  Heart is enlarged. Atherosclerotic changes are present at the aortic arch. Lung volumes are low. No edema or effusion is present. No focal airspace disease is present. FINDINGS: Heart is enlarged. Atherosclerotic changes are present at the aortic arch. Lung volumes are low. No edema or effusion is present. No focal airspace disease is present. IMPRESSION: 1. Cardiomegaly without failure. 2. Low lung volumes. Electronically Signed   By: San Morelle M.D.   On: 06/21/2021 16:09   DG Chest Port 1 View  Result Date: 06/18/2021 CLINICAL DATA:  Encounter for ET tube and OG tube placement EXAM: PORTABLE CHEST 1 VIEW COMPARISON:  Same day CT chest FINDINGS: Endotracheal tube overlies the midthoracic trachea. There is an orogastric tube with tip overlying the mid esophagus. Fibular pads overlie the left lower chest. Enlarged cardiac silhouette. Bibasilar subsegmental atelectasis. There is no focal airspace consolidation. There is no large pleural effusion or visible pneumothorax. There is no acute osseous abnormality. IMPRESSION: Endotracheal tube tip overlies the midthoracic trachea. Orogastric tube tip overlies the mid esophagus, recommend advancement. Electronically Signed   By: Maurine Simmering M.D.   On: 06/18/2021 18:56   VAS [place] ABI WITH/WO TBI  Result Date: 06/22/2021  LOWER EXTREMITY DOPPLER STUDY Patient Name:  DEMARRION MEIKLEJOHN  Date of Exam:   06/18/2021 Medical Rec #: [id number]         Accession #:    [id number] Date of Birth: 02-Mar-1951          Patient Gender: M Patient Age:   77 years Exam Location:  [facility] Procedure:      VAS [place] ABI WITH/WO TBI Referring Phys: Corene Cornea DEW  --------------------------------------------------------------------------------  Indications: Peripheral artery disease.  Vascular Interventions: 01/14/2021 Bilateral CIA and EIA stents. Comparison Study: 01/2021 Performing Technologist: Concha Norway RVT  Examination Guidelines: A complete evaluation includes at minimum, Doppler waveform signals and systolic blood pressure reading at the level of bilateral brachial, anterior tibial, and posterior tibial arteries, when vessel segments are accessible. Bilateral testing is considered an integral part of  a complete examination. Photoelectric Plethysmograph (PPG) waveforms and toe systolic pressure readings are included as required and additional duplex testing as needed. Limited examinations for reoccurring indications may be performed as noted.  ABI Findings: +---------+------------------+-----+--------+--------+ Right    Rt Pressure (mmHg)IndexWaveformComment  +---------+------------------+-----+--------+--------+ Brachial 141                                     +---------+------------------+-----+--------+--------+ ATA      160                    biphasic1.13     +---------+------------------+-----+--------+--------+ PTA      181               1.28 biphasic         +---------+------------------+-----+--------+--------+ Ellen Henri               0.79 Normal           +---------+------------------+-----+--------+--------+ +---------+------------------+-----+----------+-------+ Left     Lt Pressure (mmHg)IndexWaveform  Comment +---------+------------------+-----+----------+-------+ ATA      112                    monophasic.79     +---------+------------------+-----+----------+-------+ PTA      107               0.76 monophasic        +---------+------------------+-----+----------+-------+ Great Toe53                0.38 Abnormal          +---------+------------------+-----+----------+-------+  +-------+-----------+-----------+------------+------------+ ABI/TBIToday's ABIToday's TBIPrevious ABIPrevious TBI +-------+-----------+-----------+------------+------------+ Right  1.28       .79        1.03        .74          +-------+-----------+-----------+------------+------------+ Left   .79        .39        .62         .23          +-------+-----------+-----------+------------+------------+  Left ABIs and TBIs appear increased compared to prior study on 01/2021.  Summary: Right: Resting right ankle-brachial index is within normal range. No evidence of significant right lower extremity arterial disease. The right toe-brachial index is normal. Duplex added EIA patent. Ltd look at calf veins with normal compression. Left: Duplex added EIA and stent, CFA patent.  *See table(s) above for measurements and observations.  Electronically signed by [name] MD on 06/22/2021 at 11:15:03 AM.    Final    ECHOCARDIOGRAM COMPLETE  Result Date: 06/19/2021    ECHOCARDIOGRAM REPORT   Patient Name:   ALEXIE SAMSON Date of Exam: 06/19/2021 Medical Rec #:  [id number]        Height:       65.0 in Accession #:    [id number]       Weight:       214.3 lb Date of Birth:  [birth year]         BSA:          2.037 m Patient Age:    65 years         BP:           124/45 mmHg Patient Gender: M                HR:           65 bpm.  Exam Location:  [facility] Procedure: 2D Echo and Intracardiac Opacification Agent Indications:     Cardiac arrest I46.9  History:         Patient has prior history of Echocardiogram examinations, most                  recent 01/28/2019.  Sonographer:     Kathlen Brunswick RDCS Referring Phys:  [id number] McBride E GRAVES Diagnosing Phys: Kate Sable MD  Sonographer Comments: Technically challenging study due to limited acoustic windows and echo performed with patient supine and on artificial respirator. IMPRESSIONS  1. Left ventricular ejection fraction, by estimation, is 50%. The left ventricle has low  normal function. The left ventricle has no regional wall motion abnormalities. Left ventricular diastolic function could not be evaluated.  2. Right ventricular systolic function is low normal. The right ventricular size is not well visualized.  3. The mitral valve is normal in structure. No evidence of mitral valve regurgitation.  4. The aortic valve is grossly normal. Aortic valve regurgitation is not visualized. FINDINGS  Left Ventricle: Left ventricular ejection fraction, by estimation, is 50%. The left ventricle has low normal function. The left ventricle has no regional wall motion abnormalities. Definity contrast agent was given IV to delineate the left ventricular endocardial borders. The left ventricular internal cavity size was normal in size. There is no left ventricular hypertrophy. Left ventricular diastolic function could not be evaluated. Right Ventricle: The right ventricular size is not well visualized. No increase in right ventricular wall thickness. Right ventricular systolic function is low normal. Left Atrium: Left atrial size was normal in size. Right Atrium: Right atrial size was not well visualized. Pericardium: There is no evidence of pericardial effusion. Mitral Valve: The mitral valve is normal in structure. No evidence of mitral valve regurgitation. Tricuspid Valve: The tricuspid valve is not well visualized. Tricuspid valve regurgitation is not demonstrated. Aortic Valve: The aortic valve is grossly normal. Aortic valve regurgitation is not visualized. Pulmonic Valve: The pulmonic valve was not well visualized. Pulmonic valve regurgitation is not visualized. Aorta: The aortic root is normal in size and structure. Venous: IVC assessment for right atrial pressure unable to be performed due to mechanical ventilation. IAS/Shunts: The interatrial septum was not well visualized.  LEFT VENTRICLE PLAX 2D LVIDd:         5.40 cm LVIDs:         4.20 cm LV PW:         1.10 cm LV IVS:        1.00 cm  LVOT diam:     2.20 cm LVOT Area:     3.80 cm  LEFT ATRIUM         Index LA diam:    3.70 cm 1.82 cm/m   AORTA Ao Root diam: 3.40 cm Ao Asc diam:  3.40 cm  SHUNTS Systemic Diam: 2.20 cm Kate Sable MD Electronically signed by Kate Sable MD Signature Date/Time: 06/19/2021/3:43:59 PM    Final    VAS [place] LOWER EXTREMITY ARTERIAL DUPLEX  Result Date: 06/22/2021 LOWER EXTREMITY ARTERIAL DUPLEX STUDY Patient Name:  SEFERINO OSCAR  Date of Exam:   06/18/2021 Medical Rec #: [id number]         Accession #:    [id number] Date of Birth: 05-Jan-1951          Patient Gender: M Patient Age:   69 years Exam Location:  [facility] Procedure:      VAS [place] LOWER EXTREMITY  ARTERIAL DUPLEX Referring Phys: Corene Cornea DEW --------------------------------------------------------------------------------  Indications: Peripheral artery disease.  Vascular Interventions: Bilat CIA EIA STENTS. Current ABI:            r- 1.28 ; l - .79 Performing Technologist: Concha Norway RVT  Examination Guidelines: A complete evaluation includes B-mode imaging, spectral Doppler, color Doppler, and power Doppler as needed of all accessible portions of each vessel. Bilateral testing is considered an integral part of a complete examination. Limited examinations for reoccurring indications may be performed as noted.  +----------+--------+-----+--------+--------+--------+ RIGHT     PSV cm/sRatioStenosisWaveformComments +----------+--------+-----+--------+--------+--------+ EIA Prox  256                  biphasic         +----------+--------+-----+--------+--------+--------+ EIA Mid   194                  biphasic         +----------+--------+-----+--------+--------+--------+ EIA Distal175                  biphasic         +----------+--------+-----+--------+--------+--------+ CFA Mid   73                                    +----------+--------+-----+--------+--------+--------+ POP Mid   81                    biphasic         +----------+--------+-----+--------+--------+--------+ PTA Distal84                   biphasic         +----------+--------+-----+--------+--------+--------+  +----------+--------+-----+--------+----------+--------+ LEFT      PSV cm/sRatioStenosisWaveform  Comments +----------+--------+-----+--------+----------+--------+ EIA Prox  239                  biphasic           +----------+--------+-----+--------+----------+--------+ EIA Mid   230                  biphasic           +----------+--------+-----+--------+----------+--------+ EIA Distal131                  biphasic           +----------+--------+-----+--------+----------+--------+ CFA Mid   148                  biphasic           +----------+--------+-----+--------+----------+--------+ SFA Mid   92                   monophasic         +----------+--------+-----+--------+----------+--------+ POP Mid   38                                      +----------+--------+-----+--------+----------+--------+ POP Distal                     monophasic         +----------+--------+-----+--------+----------+--------+ PTA Distal45                   monophasic         +----------+--------+-----+--------+----------+--------+  Summary: Right: Moderate atherosclerosis. Biphasic flow in EIA CFA showimg evidence of patent iliac stents. Left: Moderate atherosclerosis. Biphasic flow  in EIA CFA showimg evidence of patent iliac stents.  See table(s) above for measurements and observations. Electronically signed by [name] MD on 06/22/2021 at 9:14:38 AM.    Final    US Abdomen Limited RUQ (LIVER/GB)  Result Date: 06/18/2021 CLINICAL DATA:  Elevated liver enzymes EXAM: ULTRASOUND ABDOMEN LIMITED RIGHT UPPER QUADRANT COMPARISON:  None. FINDINGS: Gallbladder: Absent Common bile duct: Diameter: 5 mm in proximal diameter Liver: The hepatic parenchymal echogenicity is diffusely increased, the echotexture  is mildly coarsened, and there is poor acoustic through transmission most in keeping with changes of moderate hepatic steatosis. No focal intrahepatic masses are identified. There is no intrahepatic biliary ductal dilation. Portal vein is patent on color Doppler imaging with normal direction of blood flow towards the liver. Other: No ascites IMPRESSION: Status post cholecystectomy. Moderate hepatic steatosis. Electronically Signed   By: Fidela Salisbury M.D.   On: 06/18/2021 20:03    EKG: RBBB, unclear P wave activity. 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   # Symptomatic bradycardia # High grade AV block He is presenting with high grade AV block with some degree of underling conduction disease (RBBB, 1st degree AV block). He had one syncope episode at home, followed by one in the ED. BP is baseline elevated today preventing use of medications such as dopamine. He is being externally paced, so will place temporary pacemaker.  - Urgent temporary pacemaker placement.  - Currently has life vest for recent VF arrest. Will transfer to [facility] (patient/family request) for consideration of pacemaker +/- ICD placement.  - Needs CT head to rule out ICH after fall  # H/o CAD s/p CABG (DES in anastomic LIMA to LAD, POBA 06/28/21) Recent admission following cardiac arrest, presumably VF (though have to wonder if bradycardic with new presentation), discovered to have lesion in DES at anastomotic lesion of LIMA for which he had POBA completed at [facility] on 06/28/21. No recent angina, so I don't suspect this rhythm is ischemic in nature.  - Holding eliquis in anticipation of ICD - Continue plavix - Will start heparin infusion this evening (12  hours after eliquis dose) if CT head is negative.  # Unprovoked DVT/PE 2012 - Holding eliquis as above for possible ICD placement - Will bridge with heparin as above.   # Acute kidney injury Renal function 1.8 from baseline 0.8. Possibly from underperfusion from bradycardia.   # HFpEF EF 50% recently. Will hold losartan and spiro in setting of AKI and while we assess BP in the acute setting.   Signed: Andrez Grime MD 07/11/2021, 2:11 PM

## 2021-07-11 NOTE — Progress Notes (Signed)
Patient arrived with temporary pacemaker in place from cath lab. Has electrical and mechanical capture. Awaiting bed at [facility]. Only complaints of pain chronic back pain which was relieved by prn tylenol admin. Called CT about stat CT order when patient arrived on unit but they stated that they did not have a spot for the patient right now due to ED patients and would call unit back when they could scan him.

## 2021-07-11 NOTE — ED Notes (Signed)
Dr. Corky Sox at bedside.
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
## 2021-07-11 NOTE — ED Notes (Signed)
    External Cardiac pacing initiated:   MA: 60 PPM: 60 Mechanical capture verified

## 2021-07-11 NOTE — Progress Notes (Signed)
[place] for heparin infusion Indication: pulmonary embolus  Allergies  Allergen Reactions   Amoxicillin Other (See Comments)    Abdominal cramps Other reaction(s): Other (See Comments) Abdominal cramps Tolerated ancef 7/16   Hydromorphone Hcl     Quick & severe nausea/vomiting  Other reaction(s): Vomiting Quick & severe nausea/vomiting    Aspirin     Other reaction(s): Other (See Comments) He has likely cerebral amyloid.  Is on NOAC for now.  No ASA with it. Not allergic per patient.     Patient Measurements: Height: 5' 6.5" (168.9 cm) Weight: 89 kg (196 lb 3.4 oz) IBW/kg (Calculated) : 64.95 Heparin Dosing Weight: 83.5 kg   Vital Signs: Temp: 98 F (36.7 C) (09/25 1535) Temp Source: Oral (09/25 1535) BP: 107/58 (09/25 1535) Pulse Rate: 60 (09/25 1552)  Labs: Recent Labs    07/11/21 1317  HGB 13.6  HCT 41.1  PLT 158  CREATININE 1.85*  TROPONINIHS 19*    Estimated Creatinine Clearance: 40.3 mL/min (A) (by C-G formula based on SCr of 1.85 mg/dL (H)).   Medical History: Past Medical History:  Diagnosis Date   Alcohol use    "cutting back" but still heavy and daily, multiple shots of bourbon each night   Diabetes mellitus without complication ([place])    History of tobacco use    reportedly quit in 2013   Hypertension    Pulmonary embolism ([place]) 2012   unprovoked   PVD (peripheral vascular disease) ([place])     Medications:  Called ICU nurse who confirmed with pt his last dose of Eliquis was 5 mg this morning.   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 Pharmacy has been consulted for heparin dosing and monitoring.   Baseline labs: H&H and Plt WNL; aPTT, PT-INR, HL pending  Goal of Therapy:  Heparin level 0.3-0.7 units/ml once  correlating with aPTT aPTT 66-102 seconds Monitor platelets by anticoagulation protocol: Yes   Plan:  No bolus. Start heparin infusion at 1400 units/hr starting at 2200 Check aPTT in 6 hours and HL daily while on heparin Will switch to monitoring HL once correlating with aPTT Continue to monitor H&H and platelets  Ceazia Harb O Cordero Surette 07/11/2021,3:55 PM

## 2021-07-11 NOTE — ED Triage Notes (Signed)
Pt arrives via ems from home after having a syncopal episode. After he passed out his HR was in the 20s. Pt arrives alert and oriented x4. Remains bradycardic. Pt briefly lost consciousness during triage for about 30 seconds, pulse was present during the episode. Pt on full cardiac monitor and defibrillator. Provider at bedside.

## 2021-07-11 NOTE — Progress Notes (Signed)
Patient transported to CT and back without incident.

## 2021-07-11 NOTE — H&P (Addendum)
History and Physical   Damon English PJS:[id number] DOB: [birth year] DOA: 07/11/2021  PCP: [name], PA-C  Outpatient Specialists: Dr. Lucky Cowboy, vascular surgeon Patient coming from: Home via EMS  I have personally briefly reviewed patient's old medical records in [place].  Chief Concern: Syncope  HPI: Damon English is a 70 y.o. 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  He was sitting at his computer when he felt dizzing and lightheaded, palpitations. HE got up to sit at recliner than needed to urniatned and after he urinated and he collapased. HE does not know how long he was out. He woke up on the floor. He yelled out for help and no one heard him. He grabbed hold of sink to ambulate to living room and got his cell phone to call 911.   He last passed out when sitting down about 2.5 weeks ago, at vascular office, Dr. Lucky Cowboy.   He denies nausea vomiting, chest pain, abdominal. He denies fever, new cough. He denies swelling in his legs. He took his aldactone this AM.   COVID/influenza A/influenza B PCR were negative  Social history: He is former tobacco user, quit 10 years. He formerly smoked 2ppd, he smoked for 40 years. He drinks etoh, last drink was two beers, last week. HE was a heavy drinker, 5-6 bourbon cocktails (mixed with sugar free lemonade). He used cocaine 30 years ago. He is a Public librarian  Vaccination history: He is vaccinated for covid 19, 3 doses of Pfizer.  ROS: Constitutional: no weight change, no fever ENT/Mouth: no sore throat, no rhinorrhea Eyes: no eye pain, no vision  changes Cardiovascular: no chest pain, no dyspnea,  no edema, no palpitations Respiratory: no cough, no sputum, no wheezing Gastrointestinal: no nausea, no vomiting, no diarrhea, no constipation Genitourinary: no urinary incontinence, no dysuria, no hematuria Musculoskeletal: no arthralgias, no myalgias Skin: no skin lesions, no pruritus, Neuro: + weakness, no loss of consciousness, no syncope Psych: no anxiety, no depression, no decrease appetite Heme/Lymph: no bruising, no bleeding  Hospital course: Patient arrived via EMS after having a syncopal episode.  Per report, he was found heart rate in the 20s.  Patient arrived alert and oriented x4.  He remained bradycardic.  He had 1 episode of losing consciousness for approximately 30 seconds.  Pulse was present during this episode.  Patient placed on on full cardiac monitor and defibrillator.  EKG was done and showed third-degree AV block and possible STEMI.  Pacing pads were placed and paced at 60 bpm with appropriate analgesia.  ED provider spoke with Dr. Fletcher Anon from cardiology.  Dr. Corky Sox presented at bedside and patient was taken emergently to the Cath Lab and a temporary pacemaker was placed.  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   Atherosclerosis of native arteries of extremity with rest pain (HCC)   Cardiac arrest (HCC)   ETOH abuse   AKI (acute kidney injury) ([place])   STEMI (ST elevation myocardial infarction) (HCC)   Symptomatic bradycardia   AV block, 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)   RBBB   # Syncope and collapse-etiology is  likely multifactorial including STEMI, symptomatic bradycardia, third-degree AV block in the setting of a patient with severe cardiac pathology # History of cardiac arrest # History of NSTEMI, CABG, multiple PCI with DES - Status post Cath Lab - Patient was discharged on triple therapy (eliquis, plavix, asa from [facility] on 9/14) with recommendations to stop aspirin on 07/07/2021 - Patient is currently being paced with a goal rate of 60 - Patient's last dose of Eliquis was reported at 10 AM on day of admission, we will continue to hold Eliquis at this time - Heparin GTT per pharmacy to be started at 10 PM, order placed per cardiology team - CT of the head without contrast has been ordered by cardiology team - Plavix 75 mg daily resumed - Imdur 30 mg daily, ranolazine 500 mg p.o. twice daily have been resumed per cardiology team - Aspirin 81 mg daily is being held per cardiology recommendations - Portable chest x-ray ordered - We will continue to trend troponins - Chaplain/spiritual services consulted - Admit to stepdown, inpatient, telemetry - Cardiology team recommends transfer to [facility] for EP services - I discussed with cardiology team regarding echo, and cardiologist recommends not ordering echo at this time as patient will get echo at [facility] on transfer  -The accepting physician at [facility] is Dr. Dionne Ano, cardiologist   # Acute kidney injury-I suspect this is prerenal, sodium chloride 75 mL/h has been ordered per cardiology team - BMP in the a.m.  # Prolonged QT-presumed secondary to problem #1, treat as above # Hypertension-Imdur 30 mg daily resumed for 07/12/2021 # Hyperlipidemia-atorvastatin 80 mg daily resumed # Non-insulin-dependent diabetes mellitus-glipizide has been discontinued at this time due to acuity of patient presentation and risk for hypoglycemia and sulfonylureas - Insulin SSI with at bedtime coverage ordered - Goal range of blood glucose during hospitalization is  140-180  # Depression/anxiety-duloxetine 60 mg p.o. twice daily resumed, lorazepam 1 mg IV every 4 hours as needed for anxiety  # GERD-PPI  # History of alcohol abuse  # History of right lower extremity cellulitis secondary to tick bite Patient was treated with doxycycline with end date on 07/03/2021  Chart reviewed.   [facility] hospitalization on 06/26/2021 to 06/30/2021: Ventricular fibrillation arrest, NSTEMI, heart failure.  He was found to have hypokalemia with potassium 2.2 and worsening coronary ischemia.  Patient received PTCA to [facility], it was recommended the patient remain on triple therapy for 7 days including Plavix 75 mg daily, Eliquis 5 mg twice daily, aspirin 81 mg.  Instructions to stop aspirin on 07/07/2021.  Spironolactone was added.  Instructions to hold Imdur should his blood pressure be found to be low and without chest pain.  Discharge instruction recommended stress test in 6 months.  LifeVest placed upon discharge. Home midodrine was discontinued at this time. It was also noted that patient had a recent tick bite and had right lower extremity cellulitis, doxycycline was ordered with end date on 07/03/2021  DVT prophylaxis: Heparin GTT Code Status: Full code Diet: Heart healthy/carb modified Family Communication: no  Disposition Plan: Pending clinical course Consults called: Cardiology Admission status: Inpatient, stepdown, telemetry  Past Medical History:  Diagnosis Date   Alcohol use    "cutting back" but still heavy and daily, multiple shots of bourbon each night   Diabetes mellitus without complication ([place])    History of tobacco use    reportedly quit in 2013   Hypertension    Pulmonary embolism ([place]) 2012   unprovoked   PVD (peripheral vascular disease) ([place])    Past Surgical History:  Procedure Laterality Date   BACK SURGERY     CHOLECYSTECTOMY     CORONARY ANGIOGRAPHY N/A 01/30/2019   Procedure: CORONARY ANGIOGRAPHY;  Surgeon: Corey Skains, MD;   Location: [place] [facility];  Service: Cardiovascular;  Laterality: N/A;   ERCP W/ SPHICTEROTOMY  2015   FEMORAL ARTERY - POPLITEAL ARTERY BYPASS GRAFT Right 06/19/2018   LEFT HEART CATH AND CORS/GRAFTS ANGIOGRAPHY N/A 06/25/2021   Procedure: LEFT HEART CATH AND CORS/GRAFTS ANGIOGRAPHY;  Surgeon: Andrez Grime, MD;  Location: [place] [facility];  Service: Cardiovascular;  Laterality: N/A;   LOWER EXTREMITY ANGIOGRAPHY Left 01/14/2021   Procedure: LOWER EXTREMITY ANGIOGRAPHY;  Surgeon: Algernon Huxley, MD;  Location: [place] [facility];  Service: Cardiovascular;  Laterality: Left;   THROMBOENDARTERECTOMY Right 06/19/2018   TONSILLECTOMY     VASCULAR SURGERY     Social History:  reports that he quit smoking about 10 years ago. His smoking use included cigarettes. He has a 80.00 pack-year smoking history. He has never used smokeless tobacco. He reports current alcohol use of about 7.0 standard drinks per week. He reports that he does not use drugs.  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  Is on NOAC for now.  No ASA with it. Not allergic per patient.    Family History  Problem Relation Age of Onset   Hypertension Mother    Parkinson's disease Mother    Heart attack Father    Vascular Disease Father    AAA (abdominal aortic aneurysm) Father    Alzheimer's disease Father    Family history: Family history reviewed and pertinent heart attack, vascular disease, AAA, in father.  Prior to Admission medications   Medication Sig Start Date End Date Taking? Authorizing Provider  acetaminophen (TYLENOL) 160 MG/5ML solution Take 20.3 mLs (650 mg total) by mouth  every 6 (six) hours as needed for fever. 06/26/21   Mansy, Arvella Merles, MD  acetaminophen (TYLENOL) 325 MG tablet Take 2 tablets (650 mg total) by mouth every 6 (six) hours as needed for fever. 06/26/21   Mansy, Arvella Merles, MD  acetaminophen (TYLENOL) 650 MG suppository Place 1 suppository (650 mg total) rectally every 6 (six) hours as needed for fever. 06/26/21   Mansy, Arvella Merles, MD  acidophilus (RISAQUAD) CAPS capsule Take 2 capsules by mouth 3 (three) times daily. 06/26/21   Mansy, Arvella Merles, MD  apixaban (ELIQUIS) 5 MG TABS tablet Take 5 mg by mouth 2 (two) times daily. 06/21/18   [provider]  atorvastatin (LIPITOR) 80 MG tablet Take 80 mg by mouth daily.    [provider]  Blood Glucose Monitoring Suppl (ONE TOUCH ULTRA 2) w/Device KIT See admin instructions. 02/25/16   [provider]  brimonidine (ALPHAGAN) 0.2 %  ophthalmic solution Place 1 drop into both eyes 2 (two) times daily. 06/26/21   Mansy, Arvella Merles, MD  Calcipotriene-Betameth Diprop (ENSTILAR) 0.005-0.064 % FOAM APPLY THIN LAYER TO AFFECTED AREAS ONCE DAILY UNTIL FLAT AND ASYMPTOMATIC, MAX 4 WEEKS 08/12/19   [provider]  cephALEXin (KEFLEX) 500 MG capsule Take 500 mg by mouth 3 (three) times daily. Patient not taking: No sig reported    [provider]  Chlorhexidine Gluconate Cloth 2 % PADS Apply 6 each topically daily. 06/26/21   Mansy, Arvella Merles, MD  clonazePAM (KLONOPIN) 0.25 MG disintegrating tablet Take 1-2 tablets (0.25-0.5 mg total) by mouth 3 (three) times daily as needed (anxiety/agitation). 06/26/21   Mansy, Arvella Merles, MD  clopidogrel (PLAVIX) 75 MG tablet Take 1 tablet (75 mg total) by mouth daily. 06/26/21   Mansy, Arvella Merles, MD  diclofenac Sodium (VOLTAREN) 1 % GEL Apply 4 g topically 4 (four) times daily.    [provider]  diphenhydrAMINE (BENADRYL) 25 mg capsule Take 1 capsule (25 mg total) by mouth once as needed for itching. 06/26/21   Mansy, Arvella Merles, MD  docusate sodium (COLACE) 100 MG capsule Take  1 capsule (100 mg total) by mouth 2 (two) times daily. 06/26/21   Mansy, Arvella Merles, MD  doxycycline (VIBRA-TABS) 100 MG tablet Take 1 tablet (100 mg total) by mouth every 12 (twelve) hours. 06/26/21   Mansy, Arvella Merles, MD  DULoxetine (CYMBALTA) 60 MG capsule Take 1 capsule (60 mg total) by mouth 2 (two) times daily. Patient not taking: No sig reported 01/30/19   Vaughan Basta, MD  esomeprazole (NEXIUM) 40 MG capsule Take 1 capsule by mouth daily. Patient not taking: No sig reported 12/03/18   [provider]  famotidine (PEPCID) 40 MG tablet Take 1 tablet by mouth every morning. 01/21/19   [provider]  folic acid (FOLVITE) 1 MG tablet Take 1 tablet (1 mg total) by mouth daily. 06/26/21   Mansy, Arvella Merles, MD  furosemide (LASIX) 20 MG tablet Take 40 mg by mouth daily.    [provider]  glipiZIDE (GLUCOTROL XL) 10 MG 24 hr tablet Take 10 mg by mouth every morning. 01/11/21   [provider]  guaiFENesin (MUCINEX) 600 MG 12 hr tablet Take 1 tablet (600 mg total) by mouth 2 (two) times daily. 06/26/21   Mansy, Jan A, MD  heparin 25000 UT/250ML infusion Inject 1,050 Units/hr into the vein continuous. 06/26/21   Mansy, Arvella Merles, MD  HYDROcodone-acetaminophen (NORCO/VICODIN) 5-325 MG tablet Take 0.5-1 tablets by mouth 2 (two) times daily as needed for pain. Patient not taking: No sig reported 01/22/19   [provider]  insulin aspart (NOVOLOG) 100 UNIT/ML injection Inject 0-15 Units into the skin 4 (four) times daily -  before meals and at bedtime. 06/26/21   Mansy, Arvella Merles, MD  isosorbide mononitrate (IMDUR) 30 MG 24 hr tablet Take 1 tablet (30 mg total) by mouth daily. 06/26/21   Mansy, Arvella Merles, MD  ketoconazole (NIZORAL) 2 % shampoo Apply topically. 09/18/20   [provider]  latanoprost (XALATAN) 0.005 % ophthalmic solution Place 1 drop into both eyes at bedtime. 06/26/21   Mansy, Arvella Merles, MD  losartan (COZAAR) 50 MG tablet Take 1 tablet (50 mg total) by mouth daily.  06/26/21   Mansy, Arvella Merles, MD  Magnesium 250 MG TABS Take by mouth. Patient not taking: No sig reported    [provider]  metoprolol succinate (TOPROL-XL) 50 MG 24 hr tablet  Take 1 tablet (50 mg total) by mouth 2 (two) times daily. Take with or immediately following a meal. 06/26/21   Mansy, Jan A, MD  midodrine (PROAMATINE) 2.5 MG tablet Take 2.5 mg by mouth daily.    [provider]  Multiple Vitamin (MULTIVITAMIN WITH MINERALS) TABS tablet Take 1 tablet by mouth daily. 06/26/21   Mansy, Arvella Merles, MD  nitroGLYCERIN (NITROSTAT) 0.4 MG SL tablet Place 1 tablet (0.4 mg total) under the tongue every 5 (five) minutes as needed for chest pain. 06/26/21   Mansy, Arvella Merles, MD  omeprazole (PRILOSEC) 40 MG capsule Take 40 mg by mouth daily.    [provider]  ondansetron (ZOFRAN) 4 MG/2ML SOLN injection Inject 2 mLs (4 mg total) into the vein every 6 (six) hours as needed for nausea. 06/26/21   Mansy, Arvella Merles, MD  [facility] ULTRA test strip 2 (two) times daily. 12/19/20   [provider]  pantoprazole (PROTONIX) 40 MG injection Inject 40 mg into the vein at bedtime. 06/26/21   Mansy, Jan A, MD  phenol (CHLORASEPTIC) 1.4 % LIQD Use as directed 1 spray in the mouth or throat as needed for throat irritation / pain. 06/26/21   Mansy, Arvella Merles, MD  polyethylene glycol (MIRALAX / GLYCOLAX) 17 g packet Take 17 g by mouth daily as needed for mild constipation or moderate constipation. 06/26/21   Mansy, Jan A, MD  ramipril (ALTACE) 2.5 MG capsule Take by mouth. 03/14/19 03/13/20  [provider]  ranolazine (RANEXA) 500 MG 12 hr tablet Take 500 mg by mouth 2 (two) times daily. 05/08/19   [provider]  SENNOSIDES PO Take by mouth. Patient not taking: No sig reported    [provider]  sodium chloride 0.9 % infusion Inject 250 mLs into the vein continuous. 06/26/21   Mansy, Jan A, MD  sodium chloride 0.9 % infusion Inject 10 mLs into the vein continuous. 06/26/21   Mansy, Jan  A, MD  sodium chloride 0.9 % infusion Inject 250 mLs into the vein as needed (for IV line care  (Saline / Heparin Lock)). 06/26/21   Mansy, Jan A, MD  sodium chloride flush (NS) 0.9 % SOLN Inject 3 mLs into the vein every 12 (twelve) hours. 06/26/21   Mansy, Jan A, MD  sodium chloride flush (NS) 0.9 % SOLN Inject 3 mLs into the vein every 12 (twelve) hours. 06/26/21   Mansy, Jan A, MD  sodium chloride flush (NS) 0.9 % SOLN Inject 3 mLs into the vein as needed. 06/26/21   Mansy, Arvella Merles, MD  spironolactone (ALDACTONE) 25 MG tablet Take 25 mg by mouth. 02/12/19 02/12/20  [provider]  sucralfate (CARAFATE) 1 g tablet Take 1 tablet by mouth 4 (four) times daily -  before meals and at bedtime. Patient not taking: No sig reported 01/23/19   [provider]  thiamine 100 MG tablet Take 1 tablet (100 mg total) by mouth daily. 06/26/21   Mansy, Arvella Merles, MD   Physical Exam: Vitals:   07/11/21 1323 07/11/21 1330 07/11/21 1353 07/11/21 1535  BP: 132/71 137/60  (!) 107/58  Pulse: 60 60    Resp: (!) '22 17  15  ' Temp:   98.4 F (36.9 C) 98 F (36.7 C)  TempSrc:    Oral  SpO2: 99% 99%     Constitutional: appears older than chronological age, NAD, calm, comfortable Eyes: PERRL, lids and conjunctivae normal ENMT: Mucous membranes are moist. Posterior pharynx clear of any  exudate or lesions. Age-appropriate dentition. Hearing appropriate Neck: normal, supple, no masses, no thyromegaly. Temporary pace maker cath in place Respiratory: clear to auscultation bilaterally, no wheezing, no crackles. Normal respiratory effort. No accessory muscle use.  Cardiovascular: Regular rate and rhythm, no murmurs / rubs / gallops. No extremity edema. 2+ pedal pulses. No carotid bruits.  Abdomen: no tenderness, no masses palpated, no hepatosplenomegaly. Bowel sounds positive.  Musculoskeletal: no clubbing / cyanosis. No joint deformity upper and lower extremities. Good ROM, no contractures, no atrophy. Normal muscle  tone.  Skin: Multiple areas of ecchymosis on upper extremities. No induration.  Venous stasis of the lower extremity. Neurologic: Sensation intact. Strength 5/5 in all 4.  Psychiatric: Normal judgment and insight. Alert and oriented x 3. Normal mood.   EKG: independently reviewed, initially showing suspected third-degree heart block.  Repeat EKG showed ST elevation in V1, 2, aVR, 2.  Depression in lateral leads, V4 to V6.  Right bundle branch block.  QTc 629  Chest x-ray on Admission: I personally reviewed and I agree with radiologist reading as below.  CARDIAC CATHETERIZATION  Result Date: 07/11/2021 Successful temporary transvenous pacemaker placement via the right internal jugular vein. Recommendations: Continue to hold Eliquis in anticipation of ICD or pacemaker placement. Resume heparin drip at 10 PM tonight. Hold nephrotoxic medications with gentle hydration given acute kidney injury.  DG Chest Port 1 View  Result Date: 07/11/2021 CLINICAL DATA:  Chest pain, syncopal episodes, bradycardia EXAM: PORTABLE CHEST 1 VIEW COMPARISON:  06/21/2021 FINDINGS: Gross cardiomegaly status post median sternotomy and CABG. Defibrillator leads project over the chest. No acute abnormality of the lungs. 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 No acute abnormality of the lungs. Electronically Signed   By: Eddie Candle M.D.   On: 07/11/2021 13:22    Labs on Admission: I have personally reviewed following labs CBC: Recent Labs  Lab 07/11/21 1317  WBC 9.9  NEUTROABS 7.5  HGB 13.6  HCT 41.1  MCV 95.8  PLT 106   Basic Metabolic Panel: Recent Labs  Lab 07/11/21 1317  NA 135  K 4.2  CL 100  CO2 21*  GLUCOSE 139*  BUN 29*  CREATININE 1.85*  CALCIUM 9.5   GFR: CrCl cannot be calculated (Unknown ideal weight.).  Liver Function Tests: Recent Labs  Lab 07/11/21 1317  AST 28  ALT 26  ALKPHOS 121  BILITOT 0.9  PROT 7.4  ALBUMIN 3.8   Urine analysis:     Component Value Date/Time   COLORURINE YELLOW (A) 06/18/2021 1731   APPEARANCEUR CLEAR (A) 06/18/2021 1731   LABSPEC 1.015 06/18/2021 1731   PHURINE 8.0 06/18/2021 1731   GLUCOSEU 100 (A) 06/18/2021 1731   HGBUR SMALL (A) 06/18/2021 1731   BILIRUBINUR NEGATIVE 06/18/2021 1731   KETONESUR NEGATIVE 06/18/2021 1731   PROTEINUR 100 (A) 06/18/2021 1731   NITRITE NEGATIVE 06/18/2021 1731   LEUKOCYTESUR NEGATIVE 06/18/2021 1731   Dr. Tobie Poet [facility]  If 7PM-7AM, please contact overnight-coverage provider If 7AM-7PM, please contact day coverage provider [email or web address]  07/11/2021, 3:40 PM

## 2021-07-11 NOTE — ED Notes (Addendum)
Pt to cath lab on full cardiac monitor, defibrillator

## 2021-07-11 NOTE — Progress Notes (Signed)
   07/11/21 1420  Clinical Encounter Type  Visited With Family;Other (Comment) (spouse and friend)  Visit Type Other (Comment);Critical Care (cath lab)  Referral From Other (Comment) (carelink page)  Recommendations  (follow up with spouse)  Spiritual Encounters  Spiritual Needs Emotional;Other (Comment) (facilitate communication with spouse)  Daryel November responded to a page regarding cardac intervention/critical care for Pt. Chaplain met with Pt's spouse and friend in ED. Chaplain escorted spouse to [employer]. Encouraged her self-care (she had not eaten today) and made sure she could return to cath lab wating area. Also verified her contact information, [phone]. Will follow up as needed.

## 2021-07-12 ENCOUNTER — Encounter: Payer: Self-pay | Admitting: Cardiovascular Disease

## 2021-07-12 DIAGNOSIS — Z8659 Personal history of other mental and behavioral disorders: Secondary | ICD-10-CM | POA: Diagnosis not present

## 2021-07-12 DIAGNOSIS — I110 Hypertensive heart disease with heart failure: Secondary | ICD-10-CM | POA: Diagnosis not present

## 2021-07-12 DIAGNOSIS — M542 Cervicalgia: Secondary | ICD-10-CM | POA: Insufficient documentation

## 2021-07-12 DIAGNOSIS — N179 Acute kidney failure, unspecified: Secondary | ICD-10-CM | POA: Diagnosis not present

## 2021-07-12 DIAGNOSIS — R4020 Unspecified coma: Secondary | ICD-10-CM | POA: Insufficient documentation

## 2021-07-12 DIAGNOSIS — L03115 Cellulitis of right lower limb: Secondary | ICD-10-CM | POA: Diagnosis not present

## 2021-07-12 DIAGNOSIS — E1139 Type 2 diabetes mellitus with other diabetic ophthalmic complication: Secondary | ICD-10-CM | POA: Diagnosis not present

## 2021-07-12 DIAGNOSIS — N189 Chronic kidney disease, unspecified: Secondary | ICD-10-CM | POA: Diagnosis not present

## 2021-07-12 DIAGNOSIS — E1136 Type 2 diabetes mellitus with diabetic cataract: Secondary | ICD-10-CM | POA: Diagnosis not present

## 2021-07-12 DIAGNOSIS — I5022 Chronic systolic (congestive) heart failure: Secondary | ICD-10-CM | POA: Diagnosis not present

## 2021-07-12 DIAGNOSIS — F32A Depression, unspecified: Secondary | ICD-10-CM | POA: Diagnosis not present

## 2021-07-12 DIAGNOSIS — E1151 Type 2 diabetes mellitus with diabetic peripheral angiopathy without gangrene: Secondary | ICD-10-CM | POA: Diagnosis not present

## 2021-07-12 DIAGNOSIS — H401131 Primary open-angle glaucoma, bilateral, mild stage: Secondary | ICD-10-CM | POA: Diagnosis not present

## 2021-07-12 DIAGNOSIS — I259 Chronic ischemic heart disease, unspecified: Secondary | ICD-10-CM | POA: Diagnosis not present

## 2021-07-12 DIAGNOSIS — I25118 Atherosclerotic heart disease of native coronary artery with other forms of angina pectoris: Secondary | ICD-10-CM | POA: Diagnosis not present

## 2021-07-12 DIAGNOSIS — D649 Anemia, unspecified: Secondary | ICD-10-CM | POA: Diagnosis not present

## 2021-07-12 DIAGNOSIS — E854 Organ-limited amyloidosis: Secondary | ICD-10-CM | POA: Diagnosis not present

## 2021-07-12 DIAGNOSIS — M549 Dorsalgia, unspecified: Secondary | ICD-10-CM | POA: Diagnosis not present

## 2021-07-12 DIAGNOSIS — R55 Syncope and collapse: Secondary | ICD-10-CM | POA: Diagnosis not present

## 2021-07-12 DIAGNOSIS — Z45018 Encounter for adjustment and management of other part of cardiac pacemaker: Secondary | ICD-10-CM | POA: Diagnosis not present

## 2021-07-12 DIAGNOSIS — Z87891 Personal history of nicotine dependence: Secondary | ICD-10-CM | POA: Insufficient documentation

## 2021-07-12 DIAGNOSIS — Z950 Presence of cardiac pacemaker: Secondary | ICD-10-CM | POA: Diagnosis not present

## 2021-07-12 DIAGNOSIS — I5042 Chronic combined systolic (congestive) and diastolic (congestive) heart failure: Secondary | ICD-10-CM | POA: Diagnosis not present

## 2021-07-12 DIAGNOSIS — G8929 Other chronic pain: Secondary | ICD-10-CM | POA: Diagnosis not present

## 2021-07-12 DIAGNOSIS — I951 Orthostatic hypotension: Secondary | ICD-10-CM | POA: Diagnosis not present

## 2021-07-12 DIAGNOSIS — R001 Bradycardia, unspecified: Secondary | ICD-10-CM | POA: Diagnosis not present

## 2021-07-12 DIAGNOSIS — W19XXXA Unspecified fall, initial encounter: Secondary | ICD-10-CM | POA: Insufficient documentation

## 2021-07-12 DIAGNOSIS — I442 Atrioventricular block, complete: Secondary | ICD-10-CM | POA: Diagnosis not present

## 2021-07-12 DIAGNOSIS — Z951 Presence of aortocoronary bypass graft: Secondary | ICD-10-CM | POA: Diagnosis not present

## 2021-07-12 DIAGNOSIS — I130 Hypertensive heart and chronic kidney disease with heart failure and stage 1 through stage 4 chronic kidney disease, or unspecified chronic kidney disease: Secondary | ICD-10-CM | POA: Diagnosis not present

## 2021-07-12 DIAGNOSIS — I509 Heart failure, unspecified: Secondary | ICD-10-CM | POA: Diagnosis not present

## 2021-07-12 DIAGNOSIS — E1141 Type 2 diabetes mellitus with diabetic mononeuropathy: Secondary | ICD-10-CM | POA: Diagnosis not present

## 2021-07-12 DIAGNOSIS — E785 Hyperlipidemia, unspecified: Secondary | ICD-10-CM | POA: Diagnosis not present

## 2021-07-12 LAB — VITAMIN B12: Vitamin B-12: 414 pg/mL (ref 180–914)

## 2021-07-12 LAB — MRSA NEXT GEN BY PCR, NASAL: MRSA by PCR Next Gen: NOT DETECTED

## 2021-07-12 NOTE — Progress Notes (Signed)
Report given to [facility], patient transferred out with [facility].

## 2021-07-13 DIAGNOSIS — Z950 Presence of cardiac pacemaker: Secondary | ICD-10-CM | POA: Insufficient documentation

## 2021-07-22 DIAGNOSIS — I2510 Atherosclerotic heart disease of native coronary artery without angina pectoris: Secondary | ICD-10-CM | POA: Diagnosis not present

## 2021-07-22 DIAGNOSIS — I5023 Acute on chronic systolic (congestive) heart failure: Secondary | ICD-10-CM | POA: Diagnosis not present

## 2021-07-22 DIAGNOSIS — I951 Orthostatic hypotension: Secondary | ICD-10-CM | POA: Diagnosis not present

## 2021-07-22 DIAGNOSIS — I25118 Atherosclerotic heart disease of native coronary artery with other forms of angina pectoris: Secondary | ICD-10-CM | POA: Diagnosis not present

## 2021-07-26 LAB — BLOOD GAS, ARTERIAL
Acid-Base Excess: 10.1 mmol/L — ABNORMAL HIGH (ref 0.0–2.0)
Bicarbonate: 35 mmol/L — ABNORMAL HIGH (ref 20.0–28.0)
FIO2: 0.4
O2 Saturation: 99 %
Patient temperature: 37
pCO2 arterial: 47 mmHg (ref 32.0–48.0)
pH, Arterial: 7.48 — ABNORMAL HIGH (ref 7.350–7.450)
pO2, Arterial: 125 mmHg — ABNORMAL HIGH (ref 83.0–108.0)

## 2021-07-27 DIAGNOSIS — M792 Neuralgia and neuritis, unspecified: Secondary | ICD-10-CM | POA: Diagnosis not present

## 2021-07-27 DIAGNOSIS — I2510 Atherosclerotic heart disease of native coronary artery without angina pectoris: Secondary | ICD-10-CM | POA: Diagnosis not present

## 2021-07-27 DIAGNOSIS — F419 Anxiety disorder, unspecified: Secondary | ICD-10-CM | POA: Diagnosis not present

## 2021-07-27 DIAGNOSIS — Z23 Encounter for immunization: Secondary | ICD-10-CM | POA: Diagnosis not present

## 2021-07-27 DIAGNOSIS — Z950 Presence of cardiac pacemaker: Secondary | ICD-10-CM | POA: Diagnosis not present

## 2021-07-27 DIAGNOSIS — Z79899 Other long term (current) drug therapy: Secondary | ICD-10-CM | POA: Diagnosis not present

## 2021-07-30 DIAGNOSIS — I4901 Ventricular fibrillation: Secondary | ICD-10-CM | POA: Diagnosis not present

## 2021-08-02 ENCOUNTER — Encounter: Payer: BC Managed Care – PPO | Attending: Cardiovascular Disease | Admitting: *Deleted

## 2021-08-02 ENCOUNTER — Other Ambulatory Visit: Payer: Self-pay

## 2021-08-02 DIAGNOSIS — Z9861 Coronary angioplasty status: Secondary | ICD-10-CM

## 2021-08-02 NOTE — Progress Notes (Signed)
Initial telephone orientation completed. Diagnosis can be found in [facility] 9/26. EP orientation scheduled for Thursday 11/3 at 10am.

## 2021-08-05 DIAGNOSIS — H401132 Primary open-angle glaucoma, bilateral, moderate stage: Secondary | ICD-10-CM | POA: Diagnosis not present

## 2021-08-10 DIAGNOSIS — E039 Hypothyroidism, unspecified: Secondary | ICD-10-CM | POA: Diagnosis not present

## 2021-08-16 DIAGNOSIS — Z950 Presence of cardiac pacemaker: Secondary | ICD-10-CM | POA: Diagnosis not present

## 2021-08-16 DIAGNOSIS — I2510 Atherosclerotic heart disease of native coronary artery without angina pectoris: Secondary | ICD-10-CM | POA: Diagnosis not present

## 2021-08-16 DIAGNOSIS — I5022 Chronic systolic (congestive) heart failure: Secondary | ICD-10-CM | POA: Diagnosis not present

## 2021-08-16 DIAGNOSIS — I5023 Acute on chronic systolic (congestive) heart failure: Secondary | ICD-10-CM | POA: Diagnosis not present

## 2021-08-16 DIAGNOSIS — I951 Orthostatic hypotension: Secondary | ICD-10-CM | POA: Diagnosis not present

## 2021-08-16 DIAGNOSIS — I25118 Atherosclerotic heart disease of native coronary artery with other forms of angina pectoris: Secondary | ICD-10-CM | POA: Diagnosis not present

## 2021-08-16 DIAGNOSIS — I739 Peripheral vascular disease, unspecified: Secondary | ICD-10-CM | POA: Diagnosis not present

## 2021-08-16 DIAGNOSIS — I442 Atrioventricular block, complete: Secondary | ICD-10-CM | POA: Diagnosis not present

## 2021-08-16 DIAGNOSIS — I4901 Ventricular fibrillation: Secondary | ICD-10-CM | POA: Diagnosis not present

## 2021-08-16 DIAGNOSIS — E782 Mixed hyperlipidemia: Secondary | ICD-10-CM | POA: Diagnosis not present

## 2021-08-19 ENCOUNTER — Other Ambulatory Visit: Payer: Self-pay

## 2021-08-19 ENCOUNTER — Encounter: Payer: BC Managed Care – PPO | Attending: Cardiovascular Disease

## 2021-08-19 VITALS — Ht 66.5 in | Wt 193.3 lb

## 2021-08-19 DIAGNOSIS — Z951 Presence of aortocoronary bypass graft: Secondary | ICD-10-CM | POA: Insufficient documentation

## 2021-08-19 DIAGNOSIS — Z9861 Coronary angioplasty status: Secondary | ICD-10-CM | POA: Diagnosis not present

## 2021-08-19 DIAGNOSIS — I214 Non-ST elevation (NSTEMI) myocardial infarction: Secondary | ICD-10-CM | POA: Insufficient documentation

## 2021-08-19 NOTE — Patient Instructions (Signed)
Patient Instructions  Patient Details  Name: Damon English MRN: [id number] Date of Birth: 07/31/1951 Referring Provider:  Lolita Patella, MD  Below are your personal goals for exercise, nutrition, and risk factors. Our goal is to help you stay on track towards obtaining and maintaining these goals. We will be discussing your progress on these goals with you throughout the program.  Initial Exercise Prescription:  Initial Exercise Prescription - 08/19/21 1400       Date of Initial Exercise RX and Referring Provider   Date 08/19/21    Referring Provider Cloretta Ned MD      Oxygen   Maintain Oxygen Saturation 88% or higher      Recumbant Bike   Level 1    RPM 60    Minutes 15    METs 1.7      NuStep   Level 1    SPM 80    Minutes 15    METs 1.7      Biostep-RELP   Level 1    SPM 50    Minutes 15    METs 1.7      Track   Laps 5   as tolerated   Minutes 15    METs 1.2      Prescription Details   Frequency (times per week) 2    Duration Progress to 30 minutes of continuous aerobic without signs/symptoms of physical distress      Intensity   THRR 40-80% of Max Heartrate 104-136    Ratings of Perceived Exertion 11-15    Perceived Dyspnea 0-4      Resistance Training   Training Prescription Yes    Weight 3 lb   ROM on left   Reps 10-15             Exercise Goals: Frequency: Be able to perform aerobic exercise two to three times per week in program working toward 2-5 days per week of home exercise.  Intensity: Work with a perceived exertion of 11 (fairly light) - 15 (hard) while following your exercise prescription.  We will make changes to your prescription with you as you progress through the program.   Duration: Be able to do 30 to 45 minutes of continuous aerobic exercise in addition to a 5 minute warm-up and a 5 minute cool-down routine.   Nutrition Goals: Your personal nutrition goals will be established when you do your nutrition analysis  with the dietician.  The following are general nutrition guidelines to follow: Cholesterol < 200mg /day Sodium < 1500mg /day Fiber: Men over 50 yrs - 30 grams per day  Personal Goals:  Personal Goals and Risk Factors at Admission - 08/19/21 1502       Core Components/Risk Factors/Patient Goals on Admission    Weight Management Yes;Weight Loss    Intervention Weight Management: Develop a combined nutrition and exercise program designed to reach desired caloric intake, while maintaining appropriate intake of nutrient and fiber, sodium and fats, and appropriate energy expenditure required for the weight goal.;Weight Management: Provide education and appropriate resources to help participant work on and attain dietary goals.;Weight Management/Obesity: Establish reasonable short term and long term weight goals.    Admit Weight 193 lb (87.5 kg)    Goal Weight: Short Term 188 lb (85.3 kg)    Goal Weight: Long Term 180 lb (81.6 kg)    Expected Outcomes Short Term: Continue to assess and modify interventions until short term weight is achieved;Long Term: Adherence to nutrition and physical activity/exercise  program aimed toward attainment of established weight goal;Weight Loss: Understanding of general recommendations for a balanced deficit meal plan, which promotes 1-2 lb weight loss per week and includes a negative energy balance of [phone] kcal/d;Understanding recommendations for meals to include 15-35% energy as protein, 25-35% energy from fat, 35-60% energy from carbohydrates, less than 200mg  of dietary cholesterol, 20-35 gm of total fiber daily;Understanding of distribution of calorie intake throughout the day with the consumption of 4-5 meals/snacks    Diabetes Yes    Intervention Provide education about signs/symptoms and action to take for hypo/hyperglycemia.;Provide education about proper nutrition, including hydration, and aerobic/resistive exercise prescription along with prescribed medications  to achieve blood glucose in normal ranges: Fasting glucose 65-99 mg/dL    Expected Outcomes Short Term: Participant verbalizes understanding of the signs/symptoms and immediate care of hyper/hypoglycemia, proper foot care and importance of medication, aerobic/resistive exercise and nutrition plan for blood glucose control.;Long Term: Attainment of HbA1C < 7%.    Hypertension Yes    Intervention Provide education on lifestyle modifcations including regular physical activity/exercise, weight management, moderate sodium restriction and increased consumption of fresh fruit, vegetables, and low fat dairy, alcohol moderation, and smoking cessation.;Monitor prescription use compliance.    Expected Outcomes Short Term: Continued assessment and intervention until BP is < 140/40mm HG in hypertensive participants. < 130/62mm HG in hypertensive participants with diabetes, heart failure or chronic kidney disease.;Long Term: Maintenance of blood pressure at goal levels.    Lipids Yes    Intervention Provide education and support for participant on nutrition & aerobic/resistive exercise along with prescribed medications to achieve LDL 70mg , HDL >40mg .    Expected Outcomes Short Term: Participant states understanding of desired cholesterol values and is compliant with medications prescribed. Participant is following exercise prescription and nutrition guidelines.;Long Term: Cholesterol controlled with medications as prescribed, with individualized exercise RX and with personalized nutrition plan. Value goals: LDL < 70mg , HDL > 40 mg.             Tobacco Use Initial Evaluation: Social History   Tobacco Use  Smoking Status Former   Packs/day: 2.00   Years: 40.00   Pack years: 80.00   Types: Cigarettes   Quit date: 11/27/2010   Years since quitting: 10.7  Smokeless Tobacco Never  Tobacco Comments   Quit in 2012    Exercise Goals and Review:  Exercise Goals     [name] 08/19/21 1500              Exercise Goals   Increase Physical Activity Yes       Intervention Provide advice, education, support and counseling about physical activity/exercise needs.;Develop an individualized exercise prescription for aerobic and resistive training based on initial evaluation findings, risk stratification, comorbidities and participant's personal goals.       Expected Outcomes Short Term: Attend rehab on a regular basis to increase amount of physical activity.;Long Term: Add in home exercise to make exercise part of routine and to increase amount of physical activity.;Long Term: Exercising regularly at least 3-5 days a week.       Increase Strength and Stamina Yes       Intervention Provide advice, education, support and counseling about physical activity/exercise needs.;Develop an individualized exercise prescription for aerobic and resistive training based on initial evaluation findings, risk stratification, comorbidities and participant's personal goals.       Expected Outcomes Short Term: Increase workloads from initial exercise prescription for resistance, speed, and METs.;Short Term: Perform resistance training exercises routinely during  rehab and add in resistance training at home;Long Term: Improve cardiorespiratory fitness, muscular endurance and strength as measured by increased METs and functional capacity (6MWT)       Able to understand and use rate of perceived exertion (RPE) scale Yes       Intervention Provide education and explanation on how to use RPE scale       Expected Outcomes Short Term: Able to use RPE daily in rehab to express subjective intensity level;Long Term:  Able to use RPE to guide intensity level when exercising independently       Able to understand and use Dyspnea scale Yes       Intervention Provide education and explanation on how to use Dyspnea scale       Expected Outcomes Short Term: Able to use Dyspnea scale daily in rehab to express subjective sense of shortness of breath  during exertion;Long Term: Able to use Dyspnea scale to guide intensity level when exercising independently       Knowledge and understanding of Target Heart Rate Range (THRR) Yes       Intervention Provide education and explanation of THRR including how the numbers were predicted and where they are located for reference       Expected Outcomes Short Term: Able to state/look up THRR;Short Term: Able to use daily as guideline for intensity in rehab;Long Term: Able to use THRR to govern intensity when exercising independently       Able to check pulse independently Yes       Intervention Provide education and demonstration on how to check pulse in carotid and radial arteries.;Review the importance of being able to check your own pulse for safety during independent exercise       Expected Outcomes Short Term: Able to explain why pulse checking is important during independent exercise;Long Term: Able to check pulse independently and accurately       Understanding of Exercise Prescription Yes       Intervention Provide education, explanation, and written materials on patient's individual exercise prescription       Expected Outcomes Short Term: Able to explain program exercise prescription;Long Term: Able to explain home exercise prescription to exercise independently                Copy of goals given to participant.

## 2021-08-19 NOTE — Progress Notes (Signed)
Cardiac Individual Treatment Plan  Patient Details  Name: Damon English MRN: [id number] Date of Birth: [birth year] Referring Provider:   Flowsheet Row Cardiac Rehab from 08/19/2021 in [facility] Cardiac and Pulmonary Rehab  Referring Provider Cloretta Ned MD       Initial Encounter Date:  Flowsheet Row Cardiac Rehab from 08/19/2021 in [facility] Cardiac and Pulmonary Rehab  Date 08/19/21       Visit Diagnosis: S/P PTCA (percutaneous transluminal coronary angioplasty)  Patient's Home Medications on Admission:  Current Outpatient Medications:    acetaminophen (TYLENOL) 325 MG tablet, Take 2 tablets (650 mg total) by mouth every 6 (six) hours as needed for fever., Disp: 20 tablet, Rfl: 0   acidophilus (RISAQUAD) CAPS capsule, Take 2 capsules by mouth 3 (three) times daily. (Patient not taking: Reported on 08/02/2021), Disp: 30 capsule, Rfl: 0   apixaban (ELIQUIS) 5 MG TABS tablet, Take by mouth., Disp: , Rfl:    atorvastatin (LIPITOR) 80 MG tablet, Take 80 mg by mouth daily., Disp: , Rfl:    brimonidine (ALPHAGAN) 0.2 % ophthalmic solution, Place 1 drop into both eyes 2 (two) times daily., Disp: 5 mL, Rfl: 12   Calcipotriene-Betameth Diprop (ENSTILAR) 0.005-0.064 % FOAM, APPLY THIN LAYER TO AFFECTED AREAS ONCE DAILY UNTIL FLAT AND ASYMPTOMATIC, MAX 4 WEEKS (Patient not taking: Reported on 08/02/2021), Disp: , Rfl:    Chlorhexidine Gluconate Cloth 2 % PADS, Apply 6 each topically daily. (Patient not taking: Reported on 08/02/2021), Disp: 1 each, Rfl: 0   clonazePAM (KLONOPIN) 0.25 MG disintegrating tablet, Take 1-2 tablets (0.25-0.5 mg total) by mouth 3 (three) times daily as needed (anxiety/agitation)., Disp: 20 tablet, Rfl: 0   clopidogrel (PLAVIX) 75 MG tablet, Take 1 tablet (75 mg total) by mouth daily., Disp: 30 tablet, Rfl: 0   diclofenac Sodium (VOLTAREN) 1 % GEL, Apply 4 g topically 4 (four) times daily., Disp: , Rfl:    docusate sodium (COLACE) 100 MG capsule, Take 1 capsule (100 mg  total) by mouth 2 (two) times daily., Disp: 10 capsule, Rfl: 0   DULoxetine (CYMBALTA) 60 MG capsule, Take 1 capsule (60 mg total) by mouth 2 (two) times daily. (Patient not taking: No sig reported), Disp: 20 capsule, Rfl: 0   esomeprazole (NEXIUM) 40 MG capsule, Take 1 capsule by mouth daily. (Patient not taking: No sig reported), Disp: , Rfl:    famotidine (PEPCID) 40 MG tablet, Take 1 tablet by mouth every morning. (Patient not taking: Reported on 08/02/2021), Disp: , Rfl:    ferrous sulfate 325 (65 FE) MG EC tablet, Take 1 tablet by mouth daily with breakfast., Disp: , Rfl:    folic acid (FOLVITE) 1 MG tablet, Take 1 tablet (1 mg total) by mouth daily. (Patient not taking: Reported on 08/02/2021), Disp: 30 tablet, Rfl: 0   furosemide (LASIX) 20 MG tablet, Take 20 mg by mouth daily as needed (Edema (Or weight gain of >2lbs overnight or 5lbs in one week))., Disp: , Rfl:    glipiZIDE (GLUCOTROL) 10 MG tablet, Take 5 mg by mouth daily before breakfast., Disp: , Rfl:    guaiFENesin (MUCINEX) 600 MG 12 hr tablet, Take 1 tablet (600 mg total) by mouth 2 (two) times daily. (Patient not taking: Reported on 08/02/2021), Disp: 30 tablet, Rfl: 0   isosorbide mononitrate (IMDUR) 30 MG 24 hr tablet, Take 1 tablet (30 mg total) by mouth daily. (Patient not taking: Reported on 08/02/2021), Disp: 30 tablet, Rfl: 0   ketoconazole (NIZORAL) 2 % shampoo, Apply topically., Disp: ,  Rfl:    latanoprost (XALATAN) 0.005 % ophthalmic solution, Place 1 drop into both eyes at bedtime., Disp: 2.5 mL, Rfl: 12   Magnesium 250 MG TABS, Take by mouth. (Patient not taking: No sig reported), Disp: , Rfl:    metoprolol succinate (TOPROL-XL) 25 MG 24 hr tablet, Take 1 tablet by mouth at bedtime., Disp: , Rfl:    Multiple Vitamin (MULTIVITAMIN WITH MINERALS) TABS tablet, Take 1 tablet by mouth daily., Disp: 30 tablet, Rfl: 0   nitroGLYCERIN (NITROSTAT) 0.4 MG SL tablet, Place 1 tablet (0.4 mg total) under the tongue every 5 (five)  minutes as needed for chest pain., Disp: 20 tablet, Rfl: 12   omeprazole (PRILOSEC) 40 MG capsule, Take 40 mg by mouth daily., Disp: , Rfl:    ondansetron (ZOFRAN) 4 MG/2ML SOLN injection, Inject 2 mLs (4 mg total) into the vein every 6 (six) hours as needed for nausea. (Patient not taking: Reported on 08/02/2021), Disp: 2 mL, Rfl: 0   phenol (CHLORASEPTIC) 1.4 % LIQD, Use as directed 1 spray in the mouth or throat as needed for throat irritation / pain. (Patient not taking: Reported on 08/02/2021), Disp: 10 mL, Rfl: 0   polyethylene glycol (MIRALAX / GLYCOLAX) 17 g packet, Take 17 g by mouth daily as needed for mild constipation or moderate constipation., Disp: 14 each, Rfl: 0   potassium chloride SA (KLOR-CON) 20 MEQ tablet, Take by mouth., Disp: , Rfl:    ranolazine (RANEXA) 500 MG 12 hr tablet, Take 500 mg by mouth 2 (two) times daily. (Patient not taking: Reported on 08/02/2021), Disp: , Rfl:    SENNOSIDES PO, Take by mouth. (Patient not taking: No sig reported), Disp: , Rfl:    spironolactone (ALDACTONE) 25 MG tablet, Take by mouth., Disp: , Rfl:    sucralfate (CARAFATE) 1 g tablet, Take 1 tablet by mouth 4 (four) times daily -  before meals and at bedtime. (Patient not taking: No sig reported), Disp: , Rfl:    thiamine 100 MG tablet, Take 1 tablet (100 mg total) by mouth daily., Disp: 30 tablet, Rfl: 0  Past Medical History: Past Medical History:  Diagnosis Date   Alcohol use    "cutting back" but still heavy and daily, multiple shots of bourbon each night   Diabetes mellitus without complication ([place])    History of tobacco use    reportedly quit in 2013   Hypertension    Pulmonary embolism ([place]) 2012   unprovoked   PVD (peripheral vascular disease) ([place])     Tobacco Use: Social History   Tobacco Use  Smoking Status Former   Packs/day: 2.00   Years: 40.00   Pack years: 80.00   Types: Cigarettes   Quit date: 11/27/2010   Years since quitting: 10.7  Smokeless Tobacco Never   Tobacco Comments   Quit in 2012    Labs: Recent Review Flowsheet Data     Labs for ITP Cardiac and Pulmonary Rehab Latest Ref Rng & Units 01/28/2019 01/29/2019 06/18/2021 06/18/2021 06/18/2021   Cholestrol 0 - 200 mg/dL 168 143 - - -   LDLCALC 0 - 99 mg/dL 113(H) 93 - - -   HDL >40 mg/dL 36(L) 33(L) - - -   Trlycerides <150 mg/dL 97 87 - - -   Hemoglobin A1c 4.8 - 5.6 % - - - - 6.1(H)   PHART 7.350 - 7.450 - - 7.58(H) 7.48(H) -   PCO2ART 32.0 - 48.0 mmHg - - 28(L) 47 -   HCO3  20.0 - 28.0 mmol/L - - 26.3 35.0(H) -   O2SAT % - - 99.9 99.0 -        Exercise Target Goals: Exercise Program Goal: Individual exercise prescription set using results from initial 6 min walk test and THRR while considering  patient's activity barriers and safety.   Exercise Prescription Goal: Initial exercise prescription builds to 30-45 minutes a day of aerobic activity, 2-3 days per week.  Home exercise guidelines will be given to patient during program as part of exercise prescription that the participant will acknowledge.   Education: Aerobic Exercise: - Group verbal and visual presentation on the components of exercise prescription. Introduces F.I.T.T principle from ACSM for exercise prescriptions.  Reviews F.I.T.T. principles of aerobic exercise including progression. Written material given at graduation. Flowsheet Row Cardiac Rehab from 09/19/2019 in [facility] Cardiac and Pulmonary Rehab  Date 08/22/19  Educator jh  Instruction Review Code 1- Verbalizes Understanding       Education: Resistance Exercise: - Group verbal and visual presentation on the components of exercise prescription. Introduces F.I.T.T principle from ACSM for exercise prescriptions  Reviews F.I.T.T. principles of resistance exercise including progression. Written material given at graduation.    Education: Exercise & Equipment Safety: - Individual verbal instruction and demonstration of equipment use and safety with use of the  equipment. Flowsheet Row Cardiac Rehab from 08/19/2021 in [facility] Cardiac and Pulmonary Rehab  Date 08/19/21  Educator [place]  Instruction Review Code 1- Verbalizes Understanding       Education: Exercise Physiology & General Exercise Guidelines: - Group verbal and written instruction with models to review the exercise physiology of the cardiovascular system and associated critical values. Provides general exercise guidelines with specific guidelines to those with heart or lung disease.    Education: Flexibility, Balance, Mind/Body Relaxation: - Group verbal and visual presentation with interactive activity on the components of exercise prescription. Introduces F.I.T.T principle from ACSM for exercise prescriptions. Reviews F.I.T.T. principles of flexibility and balance exercise training including progression. Also discusses the mind body connection.  Reviews various relaxation techniques to help reduce and manage stress (i.e. Deep breathing, progressive muscle relaxation, and visualization). Balance handout provided to take home. Written material given at graduation. Flowsheet Row Cardiac Rehab from 09/19/2019 in [facility] Cardiac and Pulmonary Rehab  Date 09/05/19  [Balance 12/3]  Educator AS  Instruction Review Code 1- Verbalizes Understanding       Activity Barriers & Risk Stratification:  Activity Barriers & Cardiac Risk Stratification - 08/19/21 1435       Activity Barriers & Cardiac Risk Stratification   Activity Barriers Balance Concerns;Muscular Weakness;Deconditioning;Chest Pain/Angina;History of Falls;Assistive Device;Back Problems    Comments uses walker on occasion, neuropathy, arm ROM restriction due to new PPM    Cardiac Risk Stratification High             6 Minute Walk:   Oxygen Initial Assessment:   Oxygen Re-Evaluation:   Oxygen Discharge (Final Oxygen Re-Evaluation):   Initial Exercise Prescription:  Initial Exercise Prescription - 08/19/21 1400       Date  of Initial Exercise RX and Referring Provider   Date 08/19/21    Referring Provider Cloretta Ned MD      Oxygen   Maintain Oxygen Saturation 88% or higher      Recumbant Bike   Level 1    RPM 60    Minutes 15    METs 1.7      NuStep   Level 1    SPM 80  Minutes 15    METs 1.7      Biostep-RELP   Level 1    SPM 50    Minutes 15    METs 1.7      Track   Laps 5   as tolerated   Minutes 15    METs 1.2      Prescription Details   Frequency (times per week) 2    Duration Progress to 30 minutes of continuous aerobic without signs/symptoms of physical distress      Intensity   THRR 40-80% of Max Heartrate 104-136    Ratings of Perceived Exertion 11-15    Perceived Dyspnea 0-4      Resistance Training   Training Prescription Yes    Weight 3 lb   ROM on left   Reps 10-15             Perform Capillary Blood Glucose checks as needed.  Exercise Prescription Changes:   Exercise Prescription Changes     Row Name 08/19/21 1400             Response to Exercise   Blood Pressure (Admit) 136/70       Blood Pressure (Exercise) 148/76       Blood Pressure (Exit) 128/76       Heart Rate (Admit) 73 bpm       Heart Rate (Exercise) 107 bpm       Heart Rate (Exit) 74 bpm       Oxygen Saturation (Admit) 99 %       Oxygen Saturation (Exercise) 100 %       Oxygen Saturation (Exit) 98 %       Rating of Perceived Exertion (Exercise) 15       Perceived Dyspnea (Exercise) 1       Symptoms chest tightness 2/10, fatigue       Comments walk test results       Duration Continue with 30 min of aerobic exercise without signs/symptoms of physical distress.       Intensity THRR unchanged                Exercise Comments:   Exercise Goals and Review:   Exercise Goals     Row Name 08/19/21 1500             Exercise Goals   Increase Physical Activity Yes       Intervention Provide advice, education, support and counseling about physical activity/exercise  needs.;Develop an individualized exercise prescription for aerobic and resistive training based on initial evaluation findings, risk stratification, comorbidities and participant's personal goals.       Expected Outcomes Short Term: Attend rehab on a regular basis to increase amount of physical activity.;Long Term: Add in home exercise to make exercise part of routine and to increase amount of physical activity.;Long Term: Exercising regularly at least 3-5 days a week.       Increase Strength and Stamina Yes       Intervention Provide advice, education, support and counseling about physical activity/exercise needs.;Develop an individualized exercise prescription for aerobic and resistive training based on initial evaluation findings, risk stratification, comorbidities and participant's personal goals.       Expected Outcomes Short Term: Increase workloads from initial exercise prescription for resistance, speed, and METs.;Short Term: Perform resistance training exercises routinely during rehab and add in resistance training at home;Long Term: Improve cardiorespiratory fitness, muscular endurance and strength as measured by increased METs and functional capacity (6MWT)  Able to understand and use rate of perceived exertion (RPE) scale Yes       Intervention Provide education and explanation on how to use RPE scale       Expected Outcomes Short Term: Able to use RPE daily in rehab to express subjective intensity level;Long Term:  Able to use RPE to guide intensity level when exercising independently       Able to understand and use Dyspnea scale Yes       Intervention Provide education and explanation on how to use Dyspnea scale       Expected Outcomes Short Term: Able to use Dyspnea scale daily in rehab to express subjective sense of shortness of breath during exertion;Long Term: Able to use Dyspnea scale to guide intensity level when exercising independently       Knowledge and understanding of  Target Heart Rate Range (THRR) Yes       Intervention Provide education and explanation of THRR including how the numbers were predicted and where they are located for reference       Expected Outcomes Short Term: Able to state/look up THRR;Short Term: Able to use daily as guideline for intensity in rehab;Long Term: Able to use THRR to govern intensity when exercising independently       Able to check pulse independently Yes       Intervention Provide education and demonstration on how to check pulse in carotid and radial arteries.;Review the importance of being able to check your own pulse for safety during independent exercise       Expected Outcomes Short Term: Able to explain why pulse checking is important during independent exercise;Long Term: Able to check pulse independently and accurately       Understanding of Exercise Prescription Yes       Intervention Provide education, explanation, and written materials on patient's individual exercise prescription       Expected Outcomes Short Term: Able to explain program exercise prescription;Long Term: Able to explain home exercise prescription to exercise independently                Exercise Goals Re-Evaluation :   Discharge Exercise Prescription (Final Exercise Prescription Changes):  Exercise Prescription Changes - 08/19/21 1400       Response to Exercise   Blood Pressure (Admit) 136/70    Blood Pressure (Exercise) 148/76    Blood Pressure (Exit) 128/76    Heart Rate (Admit) 73 bpm    Heart Rate (Exercise) 107 bpm    Heart Rate (Exit) 74 bpm    Oxygen Saturation (Admit) 99 %    Oxygen Saturation (Exercise) 100 %    Oxygen Saturation (Exit) 98 %    Rating of Perceived Exertion (Exercise) 15    Perceived Dyspnea (Exercise) 1    Symptoms chest tightness 2/10, fatigue    Comments walk test results    Duration Continue with 30 min of aerobic exercise without signs/symptoms of physical distress.    Intensity THRR unchanged              Nutrition:  Target Goals: Understanding of nutrition guidelines, daily intake of sodium 1500mg , cholesterol 200mg , calories 30% from fat and 7% or less from saturated fats, daily to have 5 or more servings of fruits and vegetables.  Education: All About Nutrition: -Group instruction provided by verbal, written material, interactive activities, discussions, models, and posters to present general guidelines for heart healthy nutrition including fat, fiber, MyPlate, the role of sodium in heart healthy  nutrition, utilization of the nutrition label, and utilization of this knowledge for meal planning. Follow up email sent as well. Written material given at graduation. Flowsheet Row Cardiac Rehab from 09/19/2019 in [facility] Cardiac and Pulmonary Rehab  Date 09/05/19  Educator [facility]  Instruction Review Code 1- Verbalizes Understanding       Biometrics:  Pre Biometrics - 08/19/21 1436       Pre Biometrics   Height 5' 6.5" (1.689 m)    Weight 193 lb 4.8 oz (87.7 kg)    BMI (Calculated) 30.74              Nutrition Therapy Plan and Nutrition Goals:  Nutrition Therapy & Goals - 08/19/21 1501       Intervention Plan   Intervention Prescribe, educate and counsel regarding individualized specific dietary modifications aiming towards targeted core components such as weight, hypertension, lipid management, diabetes, heart failure and other comorbidities.    Expected Outcomes Short Term Goal: Understand basic principles of dietary content, such as calories, fat, sodium, cholesterol and nutrients.;Short Term Goal: A plan has been developed with personal nutrition goals set during dietitian appointment.;Long Term Goal: Adherence to prescribed nutrition plan.             Nutrition Assessments:  MEDIFICTS Score Key: ?70 Need to make dietary changes  40-70 Heart Healthy Diet ? 40 Therapeutic Level Cholesterol Diet  Flowsheet Row Cardiac Rehab from 08/19/2021 in [facility] Cardiac and  Pulmonary Rehab  Picture Your Plate Total Score on Admission 76      Picture Your Plate Scores: <25 Unhealthy dietary pattern with much room for improvement. 41-50 Dietary pattern unlikely to meet recommendations for good health and room for improvement. 51-60 More healthful dietary pattern, with some room for improvement.  >60 Healthy dietary pattern, although there may be some specific behaviors that could be improved.    Nutrition Goals Re-Evaluation:   Nutrition Goals Discharge (Final Nutrition Goals Re-Evaluation):   Psychosocial: Target Goals: Acknowledge presence or absence of significant depression and/or stress, maximize coping skills, provide positive support system. Participant is able to verbalize types and ability to use techniques and skills needed for reducing stress and depression.   Education: Stress, Anxiety, and Depression - Group verbal and visual presentation to define topics covered.  Reviews how body is impacted by stress, anxiety, and depression.  Also discusses healthy ways to reduce stress and to treat/manage anxiety and depression.  Written material given at graduation.   Education: Sleep Hygiene -Provides group verbal and written instruction about how sleep can affect your health.  Define sleep hygiene, discuss sleep cycles and impact of sleep habits. Review good sleep hygiene tips.    Initial Review & Psychosocial Screening:  Initial Psych Review & Screening - 08/02/21 1544       Initial Review   Current issues with Current Stress Concerns;Current Sleep Concerns    Source of Stress Concerns Chronic Illness      Family Dynamics   Good Support System? Yes   wife     Barriers   Psychosocial barriers to participate in program There are no identifiable barriers or psychosocial needs.;The patient should benefit from training in stress management and relaxation.      Screening Interventions   Interventions Encouraged to exercise;Provide feedback about  the scores to participant;To provide support and resources with identified psychosocial needs    Expected Outcomes Short Term goal: Utilizing psychosocial counselor, staff and physician to assist with identification of specific Stressors or current issues interfering  with healing process. Setting desired goal for each stressor or current issue identified.;Long Term Goal: Stressors or current issues are controlled or eliminated.;Short Term goal: Identification and review with participant of any Quality of Life or Depression concerns found by scoring the questionnaire.;Long Term goal: The participant improves quality of Life and PHQ9 Scores as seen by post scores and/or verbalization of changes             Quality of Life Scores:   Quality of Life - 08/19/21 1434       Quality of Life   Select Quality of Life      Quality of Life Scores   Health/Function Pre 17.13 %    Socioeconomic Pre 28.29 %    Psych/Spiritual Pre 22.29 %    Family Pre 27.6 %    GLOBAL Pre 22.03 %            Scores of 19 and below usually indicate a poorer quality of life in these areas.  A difference of  2-3 points is a clinically meaningful difference.  A difference of 2-3 points in the total score of the Quality of Life Index has been associated with significant improvement in overall quality of life, self-image, physical symptoms, and general health in studies assessing change in quality of life.  PHQ-9: Recent Review Flowsheet Data     Depression screen [facility] 2/9 08/19/2021 10/01/2019 07/02/2019 05/31/2019   Decreased Interest 0 0 0 0   Down, Depressed, Hopeless 0 0 0 1   PHQ - 2 Score 0 0 0 1   Altered sleeping 1 - 1 3   Tired, decreased energy 2 1 1 3    Change in appetite 0 0 0 0   Feeling bad or failure about yourself  0 1 0 0   Trouble concentrating 0 0 0 0   Moving slowly or fidgety/restless 1 3 1  0   Suicidal thoughts 0 0 0 0   PHQ-9 Score 4 - 3 7   Difficult doing work/chores Somewhat difficult  Somewhat difficult Not difficult at all Somewhat difficult      Interpretation of Total Score  Total Score Depression Severity:  1-4 = Minimal depression, 5-9 = Mild depression, 10-14 = Moderate depression, 15-19 = Moderately severe depression, 20-27 = Severe depression   Psychosocial Evaluation and Intervention:  Psychosocial Evaluation - 08/02/21 1608       Psychosocial Evaluation & Interventions   Interventions Encouraged to exercise with the program and follow exercise prescription;Stress management education;Relaxation education    Comments Fredie is 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 He stated he felt great after graduation and wants to get back to that feeling. He does have balance concerns related to his neuropathy and uses a walker sometimes so that hinders his exercise at times. His wife is very supportive and manages his health care. They both want him to have increased stamina and is hopeful this program will help him heal. He does state that he has had night terrors that have woken him up with chest pain and requiring a nitro. His MD is aware and has encouraged him to take a Klonopin nightly which has seemed to help some.    Expected Outcomes Short: attend cardiac rehab  for education and exercise. Long: develop and maintain positive self care habits.    Continue Psychosocial Services  Follow up required by staff             Psychosocial Re-Evaluation:   Psychosocial Discharge (Final Psychosocial Re-Evaluation):   Vocational Rehabilitation: Provide vocational rehab assistance to qualifying candidates.   Vocational Rehab Evaluation & Intervention:  Vocational Rehab - 08/02/21 1544       Initial Vocational Rehab Evaluation & Intervention    Assessment shows need for Vocational Rehabilitation No             Education: Education Goals: Education classes will be provided on a variety of topics geared toward better understanding of heart health and risk factor modification. Participant will state understanding/return demonstration of topics presented as noted by education test scores.  Learning Barriers/Preferences:  Learning Barriers/Preferences - 08/02/21 1541       Learning Barriers/Preferences   Learning Barriers None    Learning Preferences None             General Cardiac Education Topics:  AED/CPR: - Group verbal and written instruction with the use of models to demonstrate the basic use of the AED with the basic ABC's of resuscitation.   Anatomy and Cardiac Procedures: - Group verbal and visual presentation and models provide information about basic cardiac anatomy and function. Reviews the testing methods done to diagnose heart disease and the outcomes of the test results. Describes the treatment choices: Medical Management, Angioplasty, or Coronary Bypass Surgery for treating various heart conditions including Myocardial Infarction, Angina, Valve Disease, and Cardiac Arrhythmias.  Written material given at graduation.   Medication Safety: - Group verbal and visual instruction to review commonly prescribed medications for heart and lung disease. Reviews the medication, class of the drug, and side effects. Includes the steps to properly store meds and maintain the prescription regimen.  Written material given at graduation.   Intimacy: - Group verbal instruction through game format to discuss how heart and lung disease can affect sexual intimacy. Written material given at graduation..   Know Your Numbers and Heart Failure: - Group verbal and visual instruction to discuss disease risk factors for cardiac and pulmonary disease and treatment options.  Reviews associated critical values for  Overweight/Obesity, Hypertension, Cholesterol, and Diabetes.  Discusses basics of heart failure: signs/symptoms and treatments.  Introduces Heart Failure Zone chart for action plan for heart failure.  Written material given at graduation.   Infection Prevention: - Provides verbal and written material to individual with discussion of infection control including proper hand washing and proper equipment cleaning during exercise session. Flowsheet Row Cardiac Rehab from 08/19/2021 in [facility] Cardiac and Pulmonary Rehab  Date 08/19/21  Educator [place]  Instruction Review Code 1- Verbalizes Understanding       Falls Prevention: - Provides verbal and written material to individual with discussion of falls prevention and safety. Flowsheet Row Cardiac Rehab from 08/19/2021 in [facility] Cardiac and Pulmonary Rehab  Date 08/19/21  Educator [place]  Instruction Review Code 2- Demonstrated Understanding       Other: -Provides group and verbal instruction on various topics (see comments)   Knowledge Questionnaire Score:  Knowledge Questionnaire Score - 08/19/21 1434       Knowledge Questionnaire Score   Pre Score 25/26: Exercise             Core Components/Risk Factors/Patient Goals at Admission:  Personal Goals and Risk Factors at Admission - 08/19/21 1502  Core Components/Risk Factors/Patient Goals on Admission    Weight Management Yes;Weight Loss    Intervention Weight Management: Develop a combined nutrition and exercise program designed to reach desired caloric intake, while maintaining appropriate intake of nutrient and fiber, sodium and fats, and appropriate energy expenditure required for the weight goal.;Weight Management: Provide education and appropriate resources to help participant work on and attain dietary goals.;Weight Management/Obesity: Establish reasonable short term and long term weight goals.    Admit Weight 193 lb (87.5 kg)    Goal Weight: Short Term 188 lb (85.3 kg)     Goal Weight: Long Term 180 lb (81.6 kg)    Expected Outcomes Short Term: Continue to assess and modify interventions until short term weight is achieved;Long Term: Adherence to nutrition and physical activity/exercise program aimed toward attainment of established weight goal;Weight Loss: Understanding of general recommendations for a balanced deficit meal plan, which promotes 1-2 lb weight loss per week and includes a negative energy balance of [phone] kcal/d;Understanding recommendations for meals to include 15-35% energy as protein, 25-35% energy from fat, 35-60% energy from carbohydrates, less than 200mg  of dietary cholesterol, 20-35 gm of total fiber daily;Understanding of distribution of calorie intake throughout the day with the consumption of 4-5 meals/snacks    Diabetes Yes    Intervention Provide education about signs/symptoms and action to take for hypo/hyperglycemia.;Provide education about proper nutrition, including hydration, and aerobic/resistive exercise prescription along with prescribed medications to achieve blood glucose in normal ranges: Fasting glucose 65-99 mg/dL    Expected Outcomes Short Term: Participant verbalizes understanding of the signs/symptoms and immediate care of hyper/hypoglycemia, proper foot care and importance of medication, aerobic/resistive exercise and nutrition plan for blood glucose control.;Long Term: Attainment of HbA1C < 7%.    Hypertension Yes    Intervention Provide education on lifestyle modifcations including regular physical activity/exercise, weight management, moderate sodium restriction and increased consumption of fresh fruit, vegetables, and low fat dairy, alcohol moderation, and smoking cessation.;Monitor prescription use compliance.    Expected Outcomes Short Term: Continued assessment and intervention until BP is < 140/92mm HG in hypertensive participants. < 130/15mm HG in hypertensive participants with diabetes, heart failure or chronic kidney  disease.;Long Term: Maintenance of blood pressure at goal levels.    Lipids Yes    Intervention Provide education and support for participant on nutrition & aerobic/resistive exercise along with prescribed medications to achieve LDL 70mg , HDL >40mg .    Expected Outcomes Short Term: Participant states understanding of desired cholesterol values and is compliant with medications prescribed. Participant is following exercise prescription and nutrition guidelines.;Long Term: Cholesterol controlled with medications as prescribed, with individualized exercise RX and with personalized nutrition plan. Value goals: LDL < 70mg , HDL > 40 mg.             Education:Diabetes - Individual verbal and written instruction to review signs/symptoms of diabetes, desired ranges of glucose level fasting, after meals and with exercise. Acknowledge that pre and post exercise glucose checks will be done for 3 sessions at entry of program. [place] from 08/19/2021 in [facility] Cardiac and Pulmonary Rehab  Education need identified 08/19/21  Date 08/19/21  Educator [place]  Instruction Review Code 1- Verbalizes Understanding       Core Components/Risk Factors/Patient Goals Review:    Core Components/Risk Factors/Patient Goals at Discharge (Final Review):    ITP Comments:  ITP Comments     [place] Name 08/02/21 1534 08/19/21 1410 08/19/21 1411       ITP Comments Initial  telephone orientation completed. Diagnosis can be found in [facility] 9/26. EP orientation scheduled for Thursday 11/3 at 10am. Completed 6MWT and gym orientation. Initial ITP created and sent for review to Dr. Emily Filbert, Medical Director. EKG strips sent to Dr. Edwin Dada.              Comments: Initial ITP

## 2021-08-26 ENCOUNTER — Other Ambulatory Visit: Payer: Self-pay

## 2021-08-26 DIAGNOSIS — Z9861 Coronary angioplasty status: Secondary | ICD-10-CM

## 2021-08-26 DIAGNOSIS — Z951 Presence of aortocoronary bypass graft: Secondary | ICD-10-CM

## 2021-08-26 DIAGNOSIS — I214 Non-ST elevation (NSTEMI) myocardial infarction: Secondary | ICD-10-CM

## 2021-08-26 LAB — GLUCOSE, CAPILLARY
Glucose-Capillary: 135 mg/dL — ABNORMAL HIGH (ref 70–99)
Glucose-Capillary: 82 mg/dL (ref 70–99)

## 2021-08-26 NOTE — Progress Notes (Signed)
Daily Session Note  Patient Details  Name: Damon English MRN: [id number] Date of Birth: [birth year] Referring Provider:   Flowsheet Row Cardiac Rehab from 08/19/2021 in [facility] Cardiac and Pulmonary Rehab  Referring Provider Cloretta Ned MD       Encounter Date: 08/26/2021  Check In:  Session Check In - 08/26/21 1107       Check-In   Supervising physician immediately available to respond to emergencies See telemetry face sheet for immediately available ER MD    Location [facility]-Cardiac & Pulmonary Rehab    Staff Present Birdie Sons, MPA, RN;Jessica Luan Pulling, MA, RCEP, CCRP, CCET;Meredith Quinlan, RN BSN;Susanne Bice, RN, BSN, CCRP    Virtual Visit No    Medication changes reported     No    Fall or balance concerns reported    No    Warm-up and Cool-down Performed on first and last piece of equipment    Resistance Training Performed Yes    VAD Patient? No    PAD/SET Patient? No      Pain Assessment   Currently in Pain? No/denies                Social History   Tobacco Use  Smoking Status Former   Packs/day: 2.00   Years: 40.00   Pack years: 80.00   Types: Cigarettes   Quit date: 11/27/2010   Years since quitting: 10.7  Smokeless Tobacco Never  Tobacco Comments   Quit in 2012    Goals Met:  Independence with exercise equipment Exercise tolerated well No report of concerns or symptoms today Strength training completed today  Goals Unmet:  Not Applicable  Comments: First full day of exercise!  Patient was oriented to gym and equipment including functions, settings, policies, and procedures.  Patient's individual exercise prescription and treatment plan were reviewed.  All starting workloads were established based on the results of the 6 minute walk test done at initial orientation visit.  The plan for exercise progression was also introduced and progression will be customized based on patient's performance and goals.     Dr. Emily Filbert is Medical  Director for [place].  Dr. Ottie Glazier is Medical Director for [facility] Pulmonary Rehabilitation.

## 2021-08-30 DIAGNOSIS — I4901 Ventricular fibrillation: Secondary | ICD-10-CM | POA: Diagnosis not present

## 2021-08-31 ENCOUNTER — Other Ambulatory Visit: Payer: Self-pay

## 2021-08-31 DIAGNOSIS — Z9861 Coronary angioplasty status: Secondary | ICD-10-CM | POA: Diagnosis not present

## 2021-08-31 DIAGNOSIS — I214 Non-ST elevation (NSTEMI) myocardial infarction: Secondary | ICD-10-CM | POA: Diagnosis not present

## 2021-08-31 DIAGNOSIS — Z951 Presence of aortocoronary bypass graft: Secondary | ICD-10-CM | POA: Diagnosis not present

## 2021-08-31 LAB — GLUCOSE, CAPILLARY
Glucose-Capillary: 55 mg/dL — ABNORMAL LOW (ref 70–99)
Glucose-Capillary: 94 mg/dL (ref 70–99)

## 2021-08-31 NOTE — Progress Notes (Signed)
Incomplete Session Note  Patient Details  Name: Damon English MRN: [id number] Date of Birth: Jun 09, 1951 Referring Provider:   Flowsheet Row Cardiac Rehab from 08/19/2021 in [facility] Cardiac and Pulmonary Rehab  Referring Provider Cloretta Ned MD       Hardin Negus Sun did not complete his rehab session.  His BG on arrival was 55. He was given 15g glucose and recheck resulted 94. BG must be greater than 110 to exercise due to the type of diabetic medications he is on. Patient was educated by Lenna Sciara, RD, about maintaining safe blood glucose level and eating after leaving today. She also informed him to have emergency sugar on hand for possible future instances of low BG. Patient was also educated to eat prior coming to rehab in the future. Patient stated understanding and has no further questions at this time.

## 2021-09-02 ENCOUNTER — Other Ambulatory Visit: Payer: Self-pay

## 2021-09-02 DIAGNOSIS — Z951 Presence of aortocoronary bypass graft: Secondary | ICD-10-CM | POA: Diagnosis not present

## 2021-09-02 DIAGNOSIS — I214 Non-ST elevation (NSTEMI) myocardial infarction: Secondary | ICD-10-CM

## 2021-09-02 DIAGNOSIS — Z9861 Coronary angioplasty status: Secondary | ICD-10-CM

## 2021-09-02 LAB — GLUCOSE, CAPILLARY
Glucose-Capillary: 59 mg/dL — ABNORMAL LOW (ref 70–99)
Glucose-Capillary: 105 mg/dL — ABNORMAL HIGH (ref 70–99)
Glucose-Capillary: 114 mg/dL — ABNORMAL HIGH (ref 70–99)
Glucose-Capillary: 108 mg/dL — ABNORMAL HIGH (ref 70–99)

## 2021-09-02 NOTE — Progress Notes (Signed)
Daily Session Note  Patient Details  Name: Damon English MRN: [id number] Date of Birth: 1951-02-01 Referring Provider:   Flowsheet Row Cardiac Rehab from 08/19/2021 in [facility] Cardiac and Pulmonary Rehab  Referring Provider Cloretta Ned MD       Encounter Date: 09/02/2021  Check In:  Session Check In - 09/02/21 1041       Check-In   Supervising physician immediately available to respond to emergencies See telemetry face sheet for immediately available ER MD    Location [facility]-Cardiac & Pulmonary Rehab    Staff Present Birdie Sons, MPA, Elveria Rising, BA, ACSM CEP, Exercise Physiologist;Meredith Sherryll Burger, RN Margurite Auerbach, MS, ASCM CEP, Exercise Physiologist    Virtual Visit No    Medication changes reported     No    Fall or balance concerns reported    No    Warm-up and Cool-down Performed on first and last piece of equipment    Resistance Training Performed Yes    VAD Patient? No    PAD/SET Patient? No      Pain Assessment   Currently in Pain? No/denies                Social History   Tobacco Use  Smoking Status Former   Packs/day: 2.00   Years: 40.00   Pack years: 80.00   Types: Cigarettes   Quit date: 11/27/2010   Years since quitting: 10.7  Smokeless Tobacco Never  Tobacco Comments   Quit in 2012    Goals Met:  Independence with exercise equipment Exercise tolerated well No report of concerns or symptoms today Strength training completed today  Goals Unmet:  Not Applicable  Comments: Pt able to follow exercise prescription today without complaint.  Will continue to monitor for progression.    Dr. Emily Filbert is Medical Director for [place].  Dr. Ottie Glazier is Medical Director for [facility] Pulmonary Rehabilitation.

## 2021-09-07 ENCOUNTER — Other Ambulatory Visit: Payer: Self-pay

## 2021-09-07 DIAGNOSIS — I214 Non-ST elevation (NSTEMI) myocardial infarction: Secondary | ICD-10-CM | POA: Diagnosis not present

## 2021-09-07 DIAGNOSIS — Z951 Presence of aortocoronary bypass graft: Secondary | ICD-10-CM | POA: Diagnosis not present

## 2021-09-07 DIAGNOSIS — Z9861 Coronary angioplasty status: Secondary | ICD-10-CM

## 2021-09-07 LAB — GLUCOSE, CAPILLARY
Glucose-Capillary: 101 mg/dL — ABNORMAL HIGH (ref 70–99)
Glucose-Capillary: 115 mg/dL — ABNORMAL HIGH (ref 70–99)
Glucose-Capillary: 120 mg/dL — ABNORMAL HIGH (ref 70–99)

## 2021-09-07 NOTE — Progress Notes (Signed)
Daily Session Note  Patient Details  Name: Damon English MRN: [id number] Date of Birth: 04/20/1951 Referring Provider:   Flowsheet Row Cardiac Rehab from 08/19/2021 in [facility] Cardiac and Pulmonary Rehab  Referring Provider Blazing, Michael MD       Encounter Date: 09/07/2021  Check In:  Session Check In - 09/07/21 1118       Check-In   Supervising physician immediately available to respond to emergencies See telemetry face sheet for immediately available ER MD    Location [facility]-Cardiac & Pulmonary Rehab    Staff Present Kelly Bollinger, MPA, RN;Amanda Sommer, BA, ACSM CEP, Exercise Physiologist;Melissa Caiola, RDN, LDN    Virtual Visit No    Medication changes reported     No    Fall or balance concerns reported    No    Warm-up and Cool-down Performed on first and last piece of equipment    Resistance Training Performed Yes    VAD Patient? No    PAD/SET Patient? No      Pain Assessment   Currently in Pain? No/denies                Social History   Tobacco Use  Smoking Status Former   Packs/day: 2.00   Years: 40.00   Pack years: 80.00   Types: Cigarettes   Quit date: 11/27/2010   Years since quitting: 10.7  Smokeless Tobacco Never  Tobacco Comments   Quit in 2012    Goals Met:  Independence with exercise equipment Exercise tolerated well Personal goals reviewed No report of concerns or symptoms today Strength training completed today  Goals Unmet:  Not Applicable  Comments: Pt able to follow exercise prescription today without complaint.  Will continue to monitor for progression.    Dr. Mark Miller is Medical Director for [facility].  Dr. Fuad Aleskerov is Medical Director for [facility]monary [facility]. 

## 2021-09-08 DIAGNOSIS — Z9861 Coronary angioplasty status: Secondary | ICD-10-CM

## 2021-09-08 NOTE — Progress Notes (Signed)
Completed initial RD consultation ?

## 2021-09-14 ENCOUNTER — Other Ambulatory Visit: Payer: Self-pay

## 2021-09-14 DIAGNOSIS — Z9861 Coronary angioplasty status: Secondary | ICD-10-CM

## 2021-09-14 DIAGNOSIS — I214 Non-ST elevation (NSTEMI) myocardial infarction: Secondary | ICD-10-CM | POA: Diagnosis not present

## 2021-09-14 DIAGNOSIS — Z951 Presence of aortocoronary bypass graft: Secondary | ICD-10-CM | POA: Diagnosis not present

## 2021-09-14 LAB — GLUCOSE, CAPILLARY
Glucose-Capillary: 109 mg/dL — ABNORMAL HIGH (ref 70–99)
Glucose-Capillary: 61 mg/dL — ABNORMAL LOW (ref 70–99)
Glucose-Capillary: 96 mg/dL (ref 70–99)

## 2021-09-14 NOTE — Progress Notes (Signed)
Daily Session Note  Patient Details  Name: Damon English MRN: [id number] Date of Birth: 01/15/1951 Referring Provider:   Flowsheet Row Cardiac Rehab from 08/19/2021 in [facility] Cardiac and Pulmonary Rehab  Referring Provider Blazing, Michael MD       Encounter Date: 09/14/2021  Check In:  Session Check In - 09/14/21 1113       Check-In   Supervising physician immediately available to respond to emergencies See telemetry face sheet for immediately available ER MD    Location [facility]-Cardiac & Pulmonary Rehab    Staff Present Kelly Bollinger, MPA, RN;Melissa Caiola, RDN, LDN;Jessica Hawkins, MA, RCEP, CCRP, CCET;Amanda Sommer, BA, ACSM CEP, Exercise Physiologist    Virtual Visit No    Medication changes reported     Yes    Comments no longer taking glipizide; taking less lasix, but can take more as needed per his MD    Fall or balance concerns reported    No    Warm-up and Cool-down Performed on first and last piece of equipment    Resistance Training Performed Yes    VAD Patient? No    PAD/SET Patient? No      Pain Assessment   Currently in Pain? No/denies                Social History   Tobacco Use  Smoking Status Former   Packs/day: 2.00   Years: 40.00   Pack years: 80.00   Types: Cigarettes   Quit date: 11/27/2010   Years since quitting: 10.8  Smokeless Tobacco Never  Tobacco Comments   Quit in 2012    Goals Met:  Independence with exercise equipment Exercise tolerated well No report of concerns or symptoms today Strength training completed today  Goals Unmet:  Not Applicable  Comments: Pt able to follow exercise prescription today without complaint.  Will continue to monitor for progression.    Dr. Mark Miller is Medical Director for [facility].  Dr. Fuad Aleskerov is Medical Director for [facility]. 

## 2021-09-15 ENCOUNTER — Encounter: Payer: Self-pay | Admitting: *Deleted

## 2021-09-15 DIAGNOSIS — Z9861 Coronary angioplasty status: Secondary | ICD-10-CM

## 2021-09-15 NOTE — Progress Notes (Signed)
Cardiac Individual Treatment Plan  Patient Details  Name: Damon English MRN: [id number] Date of Birth: [birth year] Referring Provider:   Flowsheet Row Cardiac Rehab from 08/19/2021 in [facility] Cardiac and Pulmonary Rehab  Referring Provider Cloretta Ned MD       Initial Encounter Date:  Flowsheet Row Cardiac Rehab from 08/19/2021 in [facility] Cardiac and Pulmonary Rehab  Date 08/19/21       Visit Diagnosis: S/P PTCA (percutaneous transluminal coronary angioplasty)  Patient's Home Medications on Admission:  Current Outpatient Medications:    acetaminophen (TYLENOL) 325 MG tablet, Take 2 tablets (650 mg total) by mouth every 6 (six) hours as needed for fever., Disp: 20 tablet, Rfl: 0   acidophilus (RISAQUAD) CAPS capsule, Take 2 capsules by mouth 3 (three) times daily. (Patient not taking: Reported on 08/02/2021), Disp: 30 capsule, Rfl: 0   apixaban (ELIQUIS) 5 MG TABS tablet, Take by mouth., Disp: , Rfl:    atorvastatin (LIPITOR) 80 MG tablet, Take 80 mg by mouth daily., Disp: , Rfl:    brimonidine (ALPHAGAN) 0.2 % ophthalmic solution, Place 1 drop into both eyes 2 (two) times daily., Disp: 5 mL, Rfl: 12   Calcipotriene-Betameth Diprop (ENSTILAR) 0.005-0.064 % FOAM, APPLY THIN LAYER TO AFFECTED AREAS ONCE DAILY UNTIL FLAT AND ASYMPTOMATIC, MAX 4 WEEKS (Patient not taking: Reported on 08/02/2021), Disp: , Rfl:    Chlorhexidine Gluconate Cloth 2 % PADS, Apply 6 each topically daily. (Patient not taking: Reported on 08/02/2021), Disp: 1 each, Rfl: 0   clonazePAM (KLONOPIN) 0.25 MG disintegrating tablet, Take 1-2 tablets (0.25-0.5 mg total) by mouth 3 (three) times daily as needed (anxiety/agitation)., Disp: 20 tablet, Rfl: 0   clopidogrel (PLAVIX) 75 MG tablet, Take 1 tablet (75 mg total) by mouth daily., Disp: 30 tablet, Rfl: 0   diclofenac Sodium (VOLTAREN) 1 % GEL, Apply 4 g topically 4 (four) times daily., Disp: , Rfl:    docusate sodium (COLACE) 100 MG capsule, Take 1 capsule (100 mg  total) by mouth 2 (two) times daily., Disp: 10 capsule, Rfl: 0   DULoxetine (CYMBALTA) 60 MG capsule, Take 1 capsule (60 mg total) by mouth 2 (two) times daily. (Patient not taking: No sig reported), Disp: 20 capsule, Rfl: 0   esomeprazole (NEXIUM) 40 MG capsule, Take 1 capsule by mouth daily. (Patient not taking: No sig reported), Disp: , Rfl:    famotidine (PEPCID) 40 MG tablet, Take 1 tablet by mouth every morning. (Patient not taking: Reported on 08/02/2021), Disp: , Rfl:    ferrous sulfate 325 (65 FE) MG EC tablet, Take 1 tablet by mouth daily with breakfast., Disp: , Rfl:    folic acid (FOLVITE) 1 MG tablet, Take 1 tablet (1 mg total) by mouth daily. (Patient not taking: Reported on 08/02/2021), Disp: 30 tablet, Rfl: 0   furosemide (LASIX) 20 MG tablet, Take 20 mg by mouth daily as needed (Edema (Or weight gain of >2lbs overnight or 5lbs in one week))., Disp: , Rfl:    glipiZIDE (GLUCOTROL) 10 MG tablet, Take 5 mg by mouth daily before breakfast., Disp: , Rfl:    guaiFENesin (MUCINEX) 600 MG 12 hr tablet, Take 1 tablet (600 mg total) by mouth 2 (two) times daily. (Patient not taking: Reported on 08/02/2021), Disp: 30 tablet, Rfl: 0   isosorbide mononitrate (IMDUR) 30 MG 24 hr tablet, Take 1 tablet (30 mg total) by mouth daily. (Patient not taking: Reported on 08/02/2021), Disp: 30 tablet, Rfl: 0   ketoconazole (NIZORAL) 2 % shampoo, Apply topically., Disp: ,  Rfl:    latanoprost (XALATAN) 0.005 % ophthalmic solution, Place 1 drop into both eyes at bedtime., Disp: 2.5 mL, Rfl: 12   Magnesium 250 MG TABS, Take by mouth. (Patient not taking: No sig reported), Disp: , Rfl:    metoprolol succinate (TOPROL-XL) 25 MG 24 hr tablet, Take 1 tablet by mouth at bedtime., Disp: , Rfl:    Multiple Vitamin (MULTIVITAMIN WITH MINERALS) TABS tablet, Take 1 tablet by mouth daily., Disp: 30 tablet, Rfl: 0   nitroGLYCERIN (NITROSTAT) 0.4 MG SL tablet, Place 1 tablet (0.4 mg total) under the tongue every 5 (five)  minutes as needed for chest pain., Disp: 20 tablet, Rfl: 12   omeprazole (PRILOSEC) 40 MG capsule, Take 40 mg by mouth daily., Disp: , Rfl:    ondansetron (ZOFRAN) 4 MG/2ML SOLN injection, Inject 2 mLs (4 mg total) into the vein every 6 (six) hours as needed for nausea. (Patient not taking: Reported on 08/02/2021), Disp: 2 mL, Rfl: 0   phenol (CHLORASEPTIC) 1.4 % LIQD, Use as directed 1 spray in the mouth or throat as needed for throat irritation / pain. (Patient not taking: Reported on 08/02/2021), Disp: 10 mL, Rfl: 0   polyethylene glycol (MIRALAX / GLYCOLAX) 17 g packet, Take 17 g by mouth daily as needed for mild constipation or moderate constipation., Disp: 14 each, Rfl: 0   potassium chloride SA (KLOR-CON) 20 MEQ tablet, Take by mouth., Disp: , Rfl:    ranolazine (RANEXA) 500 MG 12 hr tablet, Take 500 mg by mouth 2 (two) times daily. (Patient not taking: Reported on 08/02/2021), Disp: , Rfl:    SENNOSIDES PO, Take by mouth. (Patient not taking: No sig reported), Disp: , Rfl:    spironolactone (ALDACTONE) 25 MG tablet, Take by mouth., Disp: , Rfl:    sucralfate (CARAFATE) 1 g tablet, Take 1 tablet by mouth 4 (four) times daily -  before meals and at bedtime. (Patient not taking: No sig reported), Disp: , Rfl:    thiamine 100 MG tablet, Take 1 tablet (100 mg total) by mouth daily., Disp: 30 tablet, Rfl: 0  Past Medical History: Past Medical History:  Diagnosis Date   Alcohol use    "cutting back" but still heavy and daily, multiple shots of bourbon each night   Diabetes mellitus without complication ([place])    History of tobacco use    reportedly quit in 2013   Hypertension    Pulmonary embolism ([place]) 2012   unprovoked   PVD (peripheral vascular disease) ([place])     Tobacco Use: Social History   Tobacco Use  Smoking Status Former   Packs/day: 2.00   Years: 40.00   Pack years: 80.00   Types: Cigarettes   Quit date: 11/27/2010   Years since quitting: 10.8  Smokeless Tobacco Never   Tobacco Comments   Quit in 2012    Labs: Recent Review Flowsheet Data     Labs for ITP Cardiac and Pulmonary Rehab Latest Ref Rng & Units 01/28/2019 01/29/2019 06/18/2021 06/18/2021 06/18/2021   Cholestrol 0 - 200 mg/dL 168 143 - - -   LDLCALC 0 - 99 mg/dL 113(H) 93 - - -   HDL >40 mg/dL 36(L) 33(L) - - -   Trlycerides <150 mg/dL 97 87 - - -   Hemoglobin A1c 4.8 - 5.6 % - - - - 6.1(H)   PHART 7.350 - 7.450 - - 7.58(H) 7.48(H) -   PCO2ART 32.0 - 48.0 mmHg - - 28(L) 47 -   HCO3  20.0 - 28.0 mmol/L - - 26.3 35.0(H) -   O2SAT % - - 99.9 99.0 -        Exercise Target Goals: Exercise Program Goal: Individual exercise prescription set using results from initial 6 min walk test and THRR while considering  patient's activity barriers and safety.   Exercise Prescription Goal: Initial exercise prescription builds to 30-45 minutes a day of aerobic activity, 2-3 days per week.  Home exercise guidelines will be given to patient during program as part of exercise prescription that the participant will acknowledge.   Education: Aerobic Exercise: - Group verbal and visual presentation on the components of exercise prescription. Introduces F.I.T.T principle from ACSM for exercise prescriptions.  Reviews F.I.T.T. principles of aerobic exercise including progression. Written material given at graduation. Flowsheet Row Cardiac Rehab from 09/19/2019 in [facility] Cardiac and Pulmonary Rehab  Date 08/22/19  Educator jh  Instruction Review Code 1- Verbalizes Understanding       Education: Resistance Exercise: - Group verbal and visual presentation on the components of exercise prescription. Introduces F.I.T.T principle from ACSM for exercise prescriptions  Reviews F.I.T.T. principles of resistance exercise including progression. Written material given at graduation.    Education: Exercise & Equipment Safety: - Individual verbal instruction and demonstration of equipment use and safety with use of the  equipment. Flowsheet Row Cardiac Rehab from 09/02/2021 in [facility] Cardiac and Pulmonary Rehab  Date 08/19/21  Educator [place]  Instruction Review Code 1- Verbalizes Understanding       Education: Exercise Physiology & General Exercise Guidelines: - Group verbal and written instruction with models to review the exercise physiology of the cardiovascular system and associated critical values. Provides general exercise guidelines with specific guidelines to those with heart or lung disease.    Education: Flexibility, Balance, Mind/Body Relaxation: - Group verbal and visual presentation with interactive activity on the components of exercise prescription. Introduces F.I.T.T principle from ACSM for exercise prescriptions. Reviews F.I.T.T. principles of flexibility and balance exercise training including progression. Also discusses the mind body connection.  Reviews various relaxation techniques to help reduce and manage stress (i.e. Deep breathing, progressive muscle relaxation, and visualization). Balance handout provided to take home. Written material given at graduation. Flowsheet Row Cardiac Rehab from 09/02/2021 in [facility] Cardiac and Pulmonary Rehab  Date 08/26/21  Educator AS  Instruction Review Code 1- Verbalizes Understanding       Activity Barriers & Risk Stratification:  Activity Barriers & Cardiac Risk Stratification - 08/19/21 1435       Activity Barriers & Cardiac Risk Stratification   Activity Barriers Balance Concerns;Muscular Weakness;Deconditioning;Chest Pain/Angina;History of Falls;Assistive Device;Back Problems    Comments uses walker on occasion, neuropathy, arm ROM restriction due to new PPM    Cardiac Risk Stratification High             6 Minute Walk:   Oxygen Initial Assessment:   Oxygen Re-Evaluation:   Oxygen Discharge (Final Oxygen Re-Evaluation):   Initial Exercise Prescription:  Initial Exercise Prescription - 08/19/21 1400       Date of Initial  Exercise RX and Referring Provider   Date 08/19/21    Referring Provider Cloretta Ned MD      Oxygen   Maintain Oxygen Saturation 88% or higher      Recumbant Bike   Level 1    RPM 60    Minutes 15    METs 1.7      NuStep   Level 1    SPM 80    Minutes  15    METs 1.7      Biostep-RELP   Level 1    SPM 50    Minutes 15    METs 1.7      Track   Laps 5   as tolerated   Minutes 15    METs 1.2      Prescription Details   Frequency (times per week) 2    Duration Progress to 30 minutes of continuous aerobic without signs/symptoms of physical distress      Intensity   THRR 40-80% of Max Heartrate 104-136    Ratings of Perceived Exertion 11-15    Perceived Dyspnea 0-4      Resistance Training   Training Prescription Yes    Weight 3 lb   ROM on left   Reps 10-15             Perform Capillary Blood Glucose checks as needed.  Exercise Prescription Changes:   Exercise Prescription Changes     Row Name 08/19/21 1400 08/30/21 1400 09/13/21 1000         Response to Exercise   Blood Pressure (Admit) 136/70 126/72 140/68     Blood Pressure (Exercise) 148/76 126/64 --     Blood Pressure (Exit) 128/76 120/70 124/69     Heart Rate (Admit) 73 bpm 130 bpm 76 bpm     Heart Rate (Exercise) 107 bpm 130 bpm 90 bpm     Heart Rate (Exit) 74 bpm 84 bpm 75 bpm     Oxygen Saturation (Admit) 99 % -- --     Oxygen Saturation (Exercise) 100 % -- --     Oxygen Saturation (Exit) 98 % -- --     Rating of Perceived Exertion (Exercise) '15 15 11     ' Perceived Dyspnea (Exercise) 1 -- --     Symptoms chest tightness 2/10, fatigue -- --     Comments walk test results -- --     Duration Continue with 30 min of aerobic exercise without signs/symptoms of physical distress. Continue with 30 min of aerobic exercise without signs/symptoms of physical distress. Continue with 30 min of aerobic exercise without signs/symptoms of physical distress.     Intensity THRR unchanged THRR unchanged  THRR unchanged       Progression   Progression -- Continue to progress workloads to maintain intensity without signs/symptoms of physical distress. Continue to progress workloads to maintain intensity without signs/symptoms of physical distress.     Average METs -- 2 2       Resistance Training   Training Prescription -- Yes Yes     Weight -- 3 lb 3 lb     Reps -- 10-15 10-15       Interval Training   Interval Training -- No No       NuStep   Level -- 3 3     Minutes -- 15 15     METs -- 2 --       REL-XR   Level -- -- 1     Minutes -- -- 15       Biostep-RELP   Level -- 1 --     Minutes -- 15 --     METs -- 2 --              Exercise Comments:   Exercise Comments     Row Name 08/26/21 1107           Exercise Comments First full day of  exercise!  Patient was oriented to gym and equipment including functions, settings, policies, and procedures.  Patient's individual exercise prescription and treatment plan were reviewed.  All starting workloads were established based on the results of the 6 minute walk test done at initial orientation visit.  The plan for exercise progression was also introduced and progression will be customized based on patient's performance and goals.                Exercise Goals and Review:   Exercise Goals     Row Name 08/19/21 1500             Exercise Goals   Increase Physical Activity Yes       Intervention Provide advice, education, support and counseling about physical activity/exercise needs.;Develop an individualized exercise prescription for aerobic and resistive training based on initial evaluation findings, risk stratification, comorbidities and participant's personal goals.       Expected Outcomes Short Term: Attend rehab on a regular basis to increase amount of physical activity.;Long Term: Add in home exercise to make exercise part of routine and to increase amount of physical activity.;Long Term: Exercising regularly  at least 3-5 days a week.       Increase Strength and Stamina Yes       Intervention Provide advice, education, support and counseling about physical activity/exercise needs.;Develop an individualized exercise prescription for aerobic and resistive training based on initial evaluation findings, risk stratification, comorbidities and participant's personal goals.       Expected Outcomes Short Term: Increase workloads from initial exercise prescription for resistance, speed, and METs.;Short Term: Perform resistance training exercises routinely during rehab and add in resistance training at home;Long Term: Improve cardiorespiratory fitness, muscular endurance and strength as measured by increased METs and functional capacity (6MWT)       Able to understand and use rate of perceived exertion (RPE) scale Yes       Intervention Provide education and explanation on how to use RPE scale       Expected Outcomes Short Term: Able to use RPE daily in rehab to express subjective intensity level;Long Term:  Able to use RPE to guide intensity level when exercising independently       Able to understand and use Dyspnea scale Yes       Intervention Provide education and explanation on how to use Dyspnea scale       Expected Outcomes Short Term: Able to use Dyspnea scale daily in rehab to express subjective sense of shortness of breath during exertion;Long Term: Able to use Dyspnea scale to guide intensity level when exercising independently       Knowledge and understanding of Target Heart Rate Range (THRR) Yes       Intervention Provide education and explanation of THRR including how the numbers were predicted and where they are located for reference       Expected Outcomes Short Term: Able to state/look up THRR;Short Term: Able to use daily as guideline for intensity in rehab;Long Term: Able to use THRR to govern intensity when exercising independently       Able to check pulse independently Yes       Intervention  Provide education and demonstration on how to check pulse in carotid and radial arteries.;Review the importance of being able to check your own pulse for safety during independent exercise       Expected Outcomes Short Term: Able to explain why pulse checking is important during independent exercise;Long Term: Able  to check pulse independently and accurately       Understanding of Exercise Prescription Yes       Intervention Provide education, explanation, and written materials on patient's individual exercise prescription       Expected Outcomes Short Term: Able to explain program exercise prescription;Long Term: Able to explain home exercise prescription to exercise independently                Exercise Goals Re-Evaluation :  Exercise Goals Re-Evaluation     Row Name 08/26/21 1108 08/30/21 1422 09/13/21 1023         Exercise Goal Re-Evaluation   Exercise Goals Review Able to understand and use rate of perceived exertion (RPE) scale;Knowledge and understanding of Target Heart Rate Range (THRR);Understanding of Exercise Prescription;Increase Physical Activity;Increase Strength and Stamina;Able to understand and use Dyspnea scale;Able to check pulse independently Increase Physical Activity;Increase Strength and Stamina Increase Physical Activity;Increase Strength and Stamina     Comments Reviewed RPE and dyspnea scales, THR and program prescription with pt today.  Pt voiced understanding and was given a copy of goals to take home. Today was Garys second session. he workes at RPE 14-15.  He reached THR range.  We will monitor progress. Tong has been having trouble with getting his blood sugars up before exercise and therefore has not been able to complete a full exercise session due to limited time. He is on T4 Nustep for level 3 and is tolerating his current exercise prescription. Will continue to monitor and hope he can manage his blood sugars better with exercise.     Expected Outcomes Short:  Use RPE daily to regulate intensity. Long: Follow program prescription in THR. Short:  attend consistently Long:  improve overall stamina Short: Manage blood glucose better prior to exercise Long: Gradually increase overall MET level              Discharge Exercise Prescription (Final Exercise Prescription Changes):  Exercise Prescription Changes - 09/13/21 1000       Response to Exercise   Blood Pressure (Admit) 140/68    Blood Pressure (Exit) 124/69    Heart Rate (Admit) 76 bpm    Heart Rate (Exercise) 90 bpm    Heart Rate (Exit) 75 bpm    Rating of Perceived Exertion (Exercise) 11    Duration Continue with 30 min of aerobic exercise without signs/symptoms of physical distress.    Intensity THRR unchanged      Progression   Progression Continue to progress workloads to maintain intensity without signs/symptoms of physical distress.    Average METs 2      Resistance Training   Training Prescription Yes    Weight 3 lb    Reps 10-15      Interval Training   Interval Training No      NuStep   Level 3    Minutes 15      REL-XR   Level 1    Minutes 15             Nutrition:  Target Goals: Understanding of nutrition guidelines, daily intake of sodium <1571m, cholesterol <2023m calories 30% from fat and 7% or less from saturated fats, daily to have 5 or more servings of fruits and vegetables.  Education: All About Nutrition: -Group instruction provided by verbal, written material, interactive activities, discussions, models, and posters to present general guidelines for heart healthy nutrition including fat, fiber, MyPlate, the role of sodium in heart healthy nutrition, utilization of the  nutrition label, and utilization of this knowledge for meal planning. Follow up email sent as well. Written material given at graduation. Flowsheet Row Cardiac Rehab from 09/02/2021 in [facility] Cardiac and Pulmonary Rehab  Date 09/02/21  Educator [facility]  Instruction Review Code 1-  Verbalizes Understanding       Biometrics:  Pre Biometrics - 08/19/21 1436       Pre Biometrics   Height 5' 6.5" (1.689 m)    Weight 193 lb 4.8 oz (87.7 kg)    BMI (Calculated) 30.74              Nutrition Therapy Plan and Nutrition Goals:  Nutrition Therapy & Goals - 09/08/21 1331       Nutrition Therapy   Diet Heart healthy, low Na, diabetes friendly    Drug/Food Interactions Statins/Certain Fruits    Protein (specify units) 70g    Fiber 25 grams    Whole Grain Foods 3 servings    Saturated Fats 12 max. grams    Fruits and Vegetables 5 servings/day    Sodium 1.5 grams      Personal Nutrition Goals   Nutrition Goal ST: pair carbohydrates with fiber, fat, and protein. Eat 2-4 CHO per meal. LT: maintain heart healthy changes, limit BG lows    Comments T2DM controlled by diet. BG has been 85-90 1st thing in the morning and in the day 125-130. He reports his wife said he is not eating as much as he used to. He is concerned about his weight - he feels like his weight is going down unintentionally. Wife makes cookies with flaxseeds (1 per day throughout the day). B: cereal (multigrain cheerios with almond milk with blueberries, strawberries, or banana) or eggs with 1 slice of whole wheat toast at home or at the grill. He does not eat bacon at all anymore and he will eat sausage rarely. OJ with lemonade. L: leftovers or sandiwch with whole wheat bread ([place] with lite mayo and lettuce and tomato) or going out to eat (grilled food like burgers at mom and pop place and some vegetables) D: grilled chicken, grilled fish with a salad or another vegetable (he eats a variety), when going out to eat will get baked potato and eat half. He loves sweet potatoes and zucchini. Tomato based foods can give him reflux. Drinks: pepsi zero (2: 16 oz bottle). He used to drink liquor, but has stopped. He goes out to eat 3-4x/week and will have leftovers from that. Reviewed heart healthy eating, how  eating at home can help to limit Na, increase whole grains, increase variety, and allows use of healthy oils/fats such as olive oil which he mentioned is all they use at home. Discussed diabetes friendly eating.      Intervention Plan   Intervention Prescribe, educate and counsel regarding individualized specific dietary modifications aiming towards targeted core components such as weight, hypertension, lipid management, diabetes, heart failure and other comorbidities.    Expected Outcomes Short Term Goal: Understand basic principles of dietary content, such as calories, fat, sodium, cholesterol and nutrients.;Short Term Goal: A plan has been developed with personal nutrition goals set during dietitian appointment.;Long Term Goal: Adherence to prescribed nutrition plan.             Nutrition Assessments:  MEDIFICTS Score Key: ?70 Need to make dietary changes  40-70 Heart Healthy Diet ? 40 Therapeutic Level Cholesterol Diet  Flowsheet Row Cardiac Rehab from 08/19/2021 in [facility] Cardiac and Pulmonary Rehab  Picture Your Plate  Total Score on Admission 76      Picture Your Plate Scores: <25 Unhealthy dietary pattern with much room for improvement. 41-50 Dietary pattern unlikely to meet recommendations for good health and room for improvement. 51-60 More healthful dietary pattern, with some room for improvement.  >60 Healthy dietary pattern, although there may be some specific behaviors that could be improved.    Nutrition Goals Re-Evaluation:   Nutrition Goals Discharge (Final Nutrition Goals Re-Evaluation):   Psychosocial: Target Goals: Acknowledge presence or absence of significant depression and/or stress, maximize coping skills, provide positive support system. Participant is able to verbalize types and ability to use techniques and skills needed for reducing stress and depression.   Education: Stress, Anxiety, and Depression - Group verbal and visual presentation to define  topics covered.  Reviews how body is impacted by stress, anxiety, and depression.  Also discusses healthy ways to reduce stress and to treat/manage anxiety and depression.  Written material given at graduation.   Education: Sleep Hygiene -Provides group verbal and written instruction about how sleep can affect your health.  Define sleep hygiene, discuss sleep cycles and impact of sleep habits. Review good sleep hygiene tips.    Initial Review & Psychosocial Screening:  Initial Psych Review & Screening - 08/02/21 1544       Initial Review   Current issues with Current Stress Concerns;Current Sleep Concerns    Source of Stress Concerns Chronic Illness      Family Dynamics   Good Support System? Yes   wife     Barriers   Psychosocial barriers to participate in program There are no identifiable barriers or psychosocial needs.;The patient should benefit from training in stress management and relaxation.      Screening Interventions   Interventions Encouraged to exercise;Provide feedback about the scores to participant;To provide support and resources with identified psychosocial needs    Expected Outcomes Short Term goal: Utilizing psychosocial counselor, staff and physician to assist with identification of specific Stressors or current issues interfering with healing process. Setting desired goal for each stressor or current issue identified.;Long Term Goal: Stressors or current issues are controlled or eliminated.;Short Term goal: Identification and review with participant of any Quality of Life or Depression concerns found by scoring the questionnaire.;Long Term goal: The participant improves quality of Life and PHQ9 Scores as seen by post scores and/or verbalization of changes             Quality of Life Scores:   Quality of Life - 08/19/21 1434       Quality of Life   Select Quality of Life      Quality of Life Scores   Health/Function Pre 17.13 %    Socioeconomic Pre 28.29 %     Psych/Spiritual Pre 22.29 %    Family Pre 27.6 %    GLOBAL Pre 22.03 %            Scores of 19 and below usually indicate a poorer quality of life in these areas.  A difference of  2-3 points is a clinically meaningful difference.  A difference of 2-3 points in the total score of the Quality of Life Index has been associated with significant improvement in overall quality of life, self-image, physical symptoms, and general health in studies assessing change in quality of life.  PHQ-9: Recent Review Flowsheet Data     Depression screen [facility] 2/9 08/19/2021 10/01/2019 07/02/2019 05/31/2019   Decreased Interest 0 0 0 0   Down, Depressed,  Hopeless 0 0 0 1   PHQ - 2 Score 0 0 0 1   Altered sleeping 1 - 1 3   Tired, decreased energy '2 1 1 3   ' Change in appetite 0 0 0 0   Feeling bad or failure about yourself  0 1 0 0   Trouble concentrating 0 0 0 0   Moving slowly or fidgety/restless '1 3 1 ' 0   Suicidal thoughts 0 0 0 0   PHQ-9 Score 4 - 3 7   Difficult doing work/chores Somewhat difficult Somewhat difficult Not difficult at all Somewhat difficult      Interpretation of Total Score  Total Score Depression Severity:  1-4 = Minimal depression, 5-9 = Mild depression, 10-14 = Moderate depression, 15-19 = Moderately severe depression, 20-27 = Severe depression   Psychosocial Evaluation and Intervention:  Psychosocial Evaluation - 08/02/21 1608       Psychosocial Evaluation & Interventions   Interventions Encouraged to exercise with the program and follow exercise prescription;Stress management education;Relaxation education    Comments 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 He stated he felt great after graduation and wants to get back to that feeling. He does  have balance concerns related to his neuropathy and uses a walker sometimes so that hinders his exercise at times. His wife is very supportive and manages his health care. They both want him to have increased stamina and is hopeful this program will help him heal. He does state that he has had night terrors that have woken him up with chest pain and requiring a nitro. His MD is aware and has encouraged him to take a Klonopin nightly which has seemed to help some.    Expected Outcomes Short: attend cardiac rehab for education and exercise. Long: develop and maintain positive self care habits.    Continue Psychosocial Services  Follow up required by staff             Psychosocial Re-Evaluation:   Psychosocial Discharge (Final Psychosocial Re-Evaluation):   Vocational Rehabilitation: Provide vocational rehab assistance to qualifying candidates.   Vocational Rehab Evaluation & Intervention:  Vocational Rehab - 08/02/21 1544       Initial Vocational Rehab Evaluation & Intervention   Assessment shows need for Vocational Rehabilitation No             Education: Education Goals: Education classes will be provided on a variety of topics geared toward better understanding of heart health and risk factor modification. Participant will state understanding/return demonstration of topics presented as noted by education test scores.  Learning Barriers/Preferences:  Learning Barriers/Preferences - 08/02/21 1541       Learning Barriers/Preferences   Learning Barriers None    Learning Preferences None             General Cardiac Education Topics:  AED/CPR: - Group verbal and written instruction with the use of models to demonstrate the basic use of the AED with the basic ABC's of resuscitation.   Anatomy and Cardiac Procedures: - Group verbal and visual presentation and models provide information about basic cardiac anatomy and function. Reviews the testing methods done to  diagnose heart disease and the outcomes of the test results. Describes the treatment choices:  Medical Management, Angioplasty, or Coronary Bypass Surgery for treating various heart conditions including Myocardial Infarction, Angina, Valve Disease, and Cardiac Arrhythmias.  Written material given at graduation.   Medication Safety: - Group verbal and visual instruction to review commonly prescribed medications for heart and lung disease. Reviews the medication, class of the drug, and side effects. Includes the steps to properly store meds and maintain the prescription regimen.  Written material given at graduation.   Intimacy: - Group verbal instruction through game format to discuss how heart and lung disease can affect sexual intimacy. Written material given at graduation..   Know Your Numbers and Heart Failure: - Group verbal and visual instruction to discuss disease risk factors for cardiac and pulmonary disease and treatment options.  Reviews associated critical values for Overweight/Obesity, Hypertension, Cholesterol, and Diabetes.  Discusses basics of heart failure: signs/symptoms and treatments.  Introduces Heart Failure Zone chart for action plan for heart failure.  Written material given at graduation.   Infection Prevention: - Provides verbal and written material to individual with discussion of infection control including proper hand washing and proper equipment cleaning during exercise session. Flowsheet Row Cardiac Rehab from 09/02/2021 in [facility] Cardiac and Pulmonary Rehab  Date 08/19/21  Educator [place]  Instruction Review Code 1- Verbalizes Understanding       Falls Prevention: - Provides verbal and written material to individual with discussion of falls prevention and safety. Flowsheet Row Cardiac Rehab from 09/02/2021 in [facility] Cardiac and Pulmonary Rehab  Date 08/19/21  Educator [place]  Instruction Review Code 2- Demonstrated Understanding       Other: -Provides group and  verbal instruction on various topics (see comments)   Knowledge Questionnaire Score:  Knowledge Questionnaire Score - 08/19/21 1434       Knowledge Questionnaire Score   Pre Score 25/26: Exercise             Core Components/Risk Factors/Patient Goals at Admission:  Personal Goals and Risk Factors at Admission - 08/19/21 1502       Core Components/Risk Factors/Patient Goals on Admission    Weight Management Yes;Weight Loss    Intervention Weight Management: Develop a combined nutrition and exercise program designed to reach desired caloric intake, while maintaining appropriate intake of nutrient and fiber, sodium and fats, and appropriate energy expenditure required for the weight goal.;Weight Management: Provide education and appropriate resources to help participant work on and attain dietary goals.;Weight Management/Obesity: Establish reasonable short term and long term weight goals.    Admit Weight 193 lb (87.5 kg)    Goal Weight: Short Term 188 lb (85.3 kg)    Goal Weight: Long Term 180 lb (81.6 kg)    Expected Outcomes Short Term: Continue to assess and modify interventions until short term weight is achieved;Long Term: Adherence to nutrition and physical activity/exercise program aimed toward attainment of established weight goal;Weight Loss: Understanding of general recommendations for a balanced deficit meal plan, which promotes 1-2 lb weight loss per week and includes a negative energy balance of [phone] kcal/d;Understanding recommendations for meals to include 15-35% energy as protein, 25-35% energy from fat, 35-60% energy from carbohydrates, less than 265m of dietary cholesterol, 20-35 gm of total fiber daily;Understanding of distribution of calorie intake throughout the day with the consumption of 4-5 meals/snacks    Diabetes Yes    Intervention Provide education about signs/symptoms and action to take for hypo/hyperglycemia.;Provide education about proper nutrition,  including hydration, and aerobic/resistive exercise prescription along with prescribed medications to achieve blood glucose in normal  ranges: Fasting glucose 65-99 mg/dL    Expected Outcomes Short Term: Participant verbalizes understanding of the signs/symptoms and immediate care of hyper/hypoglycemia, proper foot care and importance of medication, aerobic/resistive exercise and nutrition plan for blood glucose control.;Long Term: Attainment of HbA1C < 7%.    Hypertension Yes    Intervention Provide education on lifestyle modifcations including regular physical activity/exercise, weight management, moderate sodium restriction and increased consumption of fresh fruit, vegetables, and low fat dairy, alcohol moderation, and smoking cessation.;Monitor prescription use compliance.    Expected Outcomes Short Term: Continued assessment and intervention until BP is < 140/35m HG in hypertensive participants. < 130/846mHG in hypertensive participants with diabetes, heart failure or chronic kidney disease.;Long Term: Maintenance of blood pressure at goal levels.    Lipids Yes    Intervention Provide education and support for participant on nutrition & aerobic/resistive exercise along with prescribed medications to achieve LDL <7073mHDL >51m63m  Expected Outcomes Short Term: Participant states understanding of desired cholesterol values and is compliant with medications prescribed. Participant is following exercise prescription and nutrition guidelines.;Long Term: Cholesterol controlled with medications as prescribed, with individualized exercise RX and with personalized nutrition plan. Value goals: LDL < 70mg70mL > 40 mg.             Education:Diabetes - Individual verbal and written instruction to review signs/symptoms of diabetes, desired ranges of glucose level fasting, after meals and with exercise. Acknowledge that pre and post exercise glucose checks will be done for 3 sessions at entry of  program. [place] [facility]  Education need identified 08/19/21  Date 08/19/21  Educator [place] Review Code 1- Verbalizes Understanding       Core Components/Risk Factors/Patient Goals Review:    Core Components/Risk Factors/Patient Goals at Discharge (Final Review):    ITP Comments:  ITP Comments     [place] 08/02/21 1534 08/19/21 1410 08/19/21 1411 08/24/21 1117 08/26/21 1107   ITP Comments Initial telephone orientation completed. Diagnosis can be found in [facility]. EP orientation scheduled for Thursday 11/3 at 10am. Completed 6MWT and gym orientation. Initial ITP created and sent for review to Dr. Mark Emily Filbertical Director. EKG strips sent to Dr. BlaziEdwin Dadake with Dr. BlaziEdwin Dadahe phone, per MD- Patient strips are OK, he is pacing. No concern with anything else, patient OK to start exercise. First full day of exercise!  Patient was oriented to gym and equipment including functions, settings, policies, and procedures.  Patient's individual exercise prescription and treatment plan were reviewed.  All starting workloads were established based on the results of the 6 minute walk test done at initial orientation visit.  The plan for exercise progression was also introduced and progression will be customized based on patient's performance and goals.    Row Name 09/08/21 1430 09/15/21 0624         ITP Comments Completed initial RD consultation 30 Day review completed. Medical Director ITP review done, changes made as directed, and signed approval by Medical Director.               Comments:

## 2021-09-16 ENCOUNTER — Other Ambulatory Visit: Payer: Self-pay

## 2021-09-16 ENCOUNTER — Encounter: Payer: BC Managed Care – PPO | Attending: Cardiovascular Disease

## 2021-09-16 DIAGNOSIS — Z9861 Coronary angioplasty status: Secondary | ICD-10-CM | POA: Insufficient documentation

## 2021-09-16 DIAGNOSIS — I214 Non-ST elevation (NSTEMI) myocardial infarction: Secondary | ICD-10-CM | POA: Insufficient documentation

## 2021-09-16 DIAGNOSIS — Z951 Presence of aortocoronary bypass graft: Secondary | ICD-10-CM | POA: Diagnosis not present

## 2021-09-16 LAB — GLUCOSE, CAPILLARY
Glucose-Capillary: 94 mg/dL (ref 70–99)
Glucose-Capillary: 101 mg/dL — ABNORMAL HIGH (ref 70–99)

## 2021-09-16 NOTE — Progress Notes (Signed)
Daily Session Note  Patient Details  Name: Damon English MRN: [id number] Date of Birth: 1951-09-07 Referring Provider:   Flowsheet Row Cardiac Rehab from 08/19/2021 in [facility] Cardiac and Pulmonary Rehab  Referring Provider Cloretta Ned MD       Encounter Date: 09/16/2021  Check In:  Session Check In - 09/16/21 1034       Check-In   Supervising physician immediately available to respond to emergencies See telemetry face sheet for immediately available ER MD    Location [facility]-Cardiac & Pulmonary Rehab    Staff Present Birdie Sons, MPA, RN;Meredith Sherryll Burger, RN BSN;Melissa Caiola, RDN, LDN;Joseph Hood, RCP,RRT,BSRT    Virtual Visit No    Medication changes reported     No    Fall or balance concerns reported    No    Warm-up and Cool-down Performed on first and last piece of equipment    Resistance Training Performed Yes    VAD Patient? No    PAD/SET Patient? No      Pain Assessment   Currently in Pain? No/denies                Social History   Tobacco Use  Smoking Status Former   Packs/day: 2.00   Years: 40.00   Pack years: 80.00   Types: Cigarettes   Quit date: 11/27/2010   Years since quitting: 10.8  Smokeless Tobacco Never  Tobacco Comments   Quit in 2012    Goals Met:  Independence with exercise equipment Exercise tolerated well No report of concerns or symptoms today Strength training completed today  Goals Unmet:  Not Applicable  Comments: Pt able to follow exercise prescription today without complaint.  Will continue to monitor for progression.    Dr. Emily Filbert is Medical Director for [place].  Dr. Ottie Glazier is Medical Director for [facility] Pulmonary Rehabilitation.

## 2021-09-21 ENCOUNTER — Encounter (INDEPENDENT_AMBULATORY_CARE_PROVIDER_SITE_OTHER): Payer: BC Managed Care – PPO

## 2021-09-21 ENCOUNTER — Ambulatory Visit (INDEPENDENT_AMBULATORY_CARE_PROVIDER_SITE_OTHER): Payer: BC Managed Care – PPO | Admitting: Vascular Surgery

## 2021-09-23 ENCOUNTER — Other Ambulatory Visit: Payer: Self-pay

## 2021-09-23 DIAGNOSIS — I214 Non-ST elevation (NSTEMI) myocardial infarction: Secondary | ICD-10-CM

## 2021-09-23 DIAGNOSIS — Z9861 Coronary angioplasty status: Secondary | ICD-10-CM | POA: Diagnosis not present

## 2021-09-23 DIAGNOSIS — Z951 Presence of aortocoronary bypass graft: Secondary | ICD-10-CM

## 2021-09-23 LAB — GLUCOSE, CAPILLARY
Glucose-Capillary: 119 mg/dL — ABNORMAL HIGH (ref 70–99)
Glucose-Capillary: 96 mg/dL (ref 70–99)

## 2021-09-23 NOTE — Progress Notes (Signed)
Daily Session Note  Patient Details  Name: Damon English MRN: [id number] Date of Birth: 1951-04-10 Referring Provider:   Flowsheet Row Cardiac Rehab from 08/19/2021 in [facility] Cardiac and Pulmonary Rehab  Referring Provider Cloretta Ned MD       Encounter Date: 09/23/2021  Check In:  Session Check In - 09/23/21 1035       Check-In   Supervising physician immediately available to respond to emergencies See telemetry face sheet for immediately available ER MD    Location [facility]-Cardiac & Pulmonary Rehab    Staff Present Birdie Sons, MPA, Elveria Rising, BA, ACSM CEP, Exercise Physiologist;Melissa [place], RDN, LDN;Meredith Sherryll Burger, RN BSN    Virtual Visit No    Medication changes reported     No    Fall or balance concerns reported    No    Warm-up and Cool-down Performed on first and last piece of equipment    Resistance Training Performed Yes    VAD Patient? No    PAD/SET Patient? No      Pain Assessment   Currently in Pain? No/denies                Social History   Tobacco Use  Smoking Status Former   Packs/day: 2.00   Years: 40.00   Pack years: 80.00   Types: Cigarettes   Quit date: 11/27/2010   Years since quitting: 10.8  Smokeless Tobacco Never  Tobacco Comments   Quit in 2012    Goals Met:  Independence with exercise equipment Exercise tolerated well No report of concerns or symptoms today Strength training completed today  Goals Unmet:  Not Applicable  Comments: Pt able to follow exercise prescription today without complaint.  Will continue to monitor for progression.    Dr. Emily Filbert is Medical Director for [place].  Dr. Ottie Glazier is Medical Director for [facility] Pulmonary Rehabilitation.

## 2021-09-28 ENCOUNTER — Other Ambulatory Visit: Payer: Self-pay

## 2021-09-28 DIAGNOSIS — Z9861 Coronary angioplasty status: Secondary | ICD-10-CM | POA: Diagnosis not present

## 2021-09-28 DIAGNOSIS — Z951 Presence of aortocoronary bypass graft: Secondary | ICD-10-CM | POA: Diagnosis not present

## 2021-09-28 DIAGNOSIS — I214 Non-ST elevation (NSTEMI) myocardial infarction: Secondary | ICD-10-CM | POA: Diagnosis not present

## 2021-09-28 LAB — GLUCOSE, CAPILLARY: Glucose-Capillary: 94 mg/dL (ref 70–99)

## 2021-09-28 NOTE — Progress Notes (Signed)
Daily Session Note  Patient Details  Name: Damon English MRN: [id number] Date of Birth: July 13, 1951 Referring Provider:   Flowsheet Row Cardiac Rehab from 08/19/2021 in [facility] Cardiac and Pulmonary Rehab  Referring Provider Cloretta Ned MD       Encounter Date: 09/28/2021  Check In:  Session Check In - 09/28/21 1101       Check-In   Supervising physician immediately available to respond to emergencies See telemetry face sheet for immediately available ER MD    Location [facility]-Cardiac & Pulmonary Rehab    Staff Present Birdie Sons, MPA, RN;Amanda Sommer, BA, ACSM CEP, Exercise Physiologist;Melissa [place], RDN, LDN;Jessica Luan Pulling, MA, RCEP, CCRP, CCET    Virtual Visit No    Medication changes reported     No    Fall or balance concerns reported    No    Warm-up and Cool-down Performed on first and last piece of equipment    Resistance Training Performed Yes    VAD Patient? No    PAD/SET Patient? No      Pain Assessment   Currently in Pain? No/denies                Social History   Tobacco Use  Smoking Status Former   Packs/day: 2.00   Years: 40.00   Pack years: 80.00   Types: Cigarettes   Quit date: 11/27/2010   Years since quitting: 10.8  Smokeless Tobacco Never  Tobacco Comments   Quit in 2012    Goals Met:  Independence with exercise equipment Exercise tolerated well Personal goals reviewed No report of concerns or symptoms today Strength training completed today  Goals Unmet:  Not Applicable  Comments: Pt able to follow exercise prescription today without complaint.  Will continue to monitor for progression.  Reviewed home exercise with pt today.  Pt plans to walk and use staff videos at home for exercise.  Reviewed THR, pulse, RPE, sign and symptoms, pulse oximetery and when to call 911 or MD.  Also discussed weather considerations and indoor options.  Pt voiced understanding.   Dr. Emily Filbert is Medical Director for [place].  Dr. Ottie Glazier is Medical Director for [facility] Pulmonary Rehabilitation.

## 2021-09-30 ENCOUNTER — Other Ambulatory Visit: Payer: Self-pay

## 2021-09-30 DIAGNOSIS — Z951 Presence of aortocoronary bypass graft: Secondary | ICD-10-CM | POA: Diagnosis not present

## 2021-09-30 DIAGNOSIS — Z9861 Coronary angioplasty status: Secondary | ICD-10-CM

## 2021-09-30 DIAGNOSIS — I214 Non-ST elevation (NSTEMI) myocardial infarction: Secondary | ICD-10-CM | POA: Diagnosis not present

## 2021-09-30 NOTE — Progress Notes (Signed)
Daily Session Note ° °Patient Details  °Name: Damon English °MRN: [id number] °Date of Birth: 06/16/1951 °Referring Provider:   °Flowsheet Row Cardiac Rehab from 08/19/2021 in [facility] Cardiac and Pulmonary Rehab  °Referring Provider Blazing, Michael MD  ° °  ° ° °Encounter Date: 09/30/2021 ° °Check In: ° Session Check In - 09/30/21 1049   ° °  ° Check-In  ° Supervising physician immediately available to respond to emergencies See telemetry face sheet for immediately available ER MD   ° Location [facility]-Cardiac & Pulmonary Rehab   ° Staff Present Kelly Bollinger, MPA, RN;Melissa Caiola, RDN, LDN;Meredith Craven, RN BSN   ° Virtual Visit No   ° Medication changes reported     No   ° Fall or balance concerns reported    No   ° Warm-up and Cool-down Performed on first and last piece of equipment   ° Resistance Training Performed Yes   ° VAD Patient? No   ° PAD/SET Patient? No   °  ° Pain Assessment  ° Currently in Pain? No/denies   ° °  °  ° °  ° ° ° ° ° °Social History  ° °Tobacco Use  °Smoking Status Former  ° Packs/day: 2.00  ° Years: 40.00  ° Pack years: 80.00  ° Types: Cigarettes  ° Quit date: 11/27/2010  ° Years since quitting: 10.8  °Smokeless Tobacco Never  °Tobacco Comments  ° Quit in 2012  ° ° °Goals Met:  °Independence with exercise equipment °Exercise tolerated well °No report of concerns or symptoms today °Strength training completed today ° °Goals Unmet:  °Not Applicable ° °Comments: Pt able to follow exercise prescription today without complaint.  Will continue to monitor for progression. ° ° ° °Dr. Mark Miller is Medical Director for [facility].  °Dr. Fuad Aleskerov is Medical Director for [facility]monary [facility]. °

## 2021-10-07 ENCOUNTER — Other Ambulatory Visit: Payer: Self-pay

## 2021-10-07 DIAGNOSIS — I214 Non-ST elevation (NSTEMI) myocardial infarction: Secondary | ICD-10-CM

## 2021-10-07 DIAGNOSIS — Z951 Presence of aortocoronary bypass graft: Secondary | ICD-10-CM | POA: Diagnosis not present

## 2021-10-07 DIAGNOSIS — Z9861 Coronary angioplasty status: Secondary | ICD-10-CM

## 2021-10-07 NOTE — Progress Notes (Signed)
Daily Session Note ° °Patient Details  °Name: Damon English °MRN: [id number] °Date of Birth: 04/25/1951 °Referring Provider:   °Flowsheet Row Cardiac Rehab from 08/19/2021 in [facility] Cardiac and Pulmonary Rehab  °Referring Provider Blazing, Michael MD  ° °  ° ° °Encounter Date: 10/07/2021 ° °Check In: ° Session Check In - 10/07/21 1039   ° °  ° Check-In  ° Supervising physician immediately available to respond to emergencies See telemetry face sheet for immediately available ER MD   ° Location [facility]-Cardiac & Pulmonary Rehab   ° Staff Present Kelly Bollinger, MPA, RN;Amanda Sommer, BA, ACSM CEP, Exercise Physiologist;Meredith Craven, RN BSN;Melissa Caiola, RDN, LDN   ° Virtual Visit No   ° Medication changes reported     No   ° Fall or balance concerns reported    No   ° Warm-up and Cool-down Performed on first and last piece of equipment   ° Resistance Training Performed Yes   ° VAD Patient? No   ° PAD/SET Patient? No   °  ° Pain Assessment  ° Currently in Pain? No/denies   ° °  °  ° °  ° ° ° ° ° °Social History  ° °Tobacco Use  °Smoking Status Former  ° Packs/day: 2.00  ° Years: 40.00  ° Pack years: 80.00  ° Types: Cigarettes  ° Quit date: 11/27/2010  ° Years since quitting: 10.8  °Smokeless Tobacco Never  °Tobacco Comments  ° Quit in 2012  ° ° °Goals Met:  °Independence with exercise equipment °Exercise tolerated well °No report of concerns or symptoms today °Strength training completed today ° °Goals Unmet:  °Not Applicable ° °Comments: Pt able to follow exercise prescription today without complaint.  Will continue to monitor for progression. ° ° ° °Dr. Mark Miller is Medical Director for [facility].  °Dr. Fuad Aleskerov is Medical Director for [facility]. °

## 2021-10-13 ENCOUNTER — Encounter: Payer: Self-pay | Admitting: *Deleted

## 2021-10-13 DIAGNOSIS — Z9861 Coronary angioplasty status: Secondary | ICD-10-CM

## 2021-10-13 NOTE — Progress Notes (Signed)
Cardiac Individual Treatment Plan  Patient Details  Name: Damon English MRN: [id number] Date of Birth: Mar 22, 1951 Referring Provider:   Flowsheet Row Cardiac Rehab from 08/19/2021 in [facility] Cardiac and Pulmonary Rehab  Referring Provider Cloretta Ned MD       Initial Encounter Date:  Flowsheet Row Cardiac Rehab from 08/19/2021 in [facility] Cardiac and Pulmonary Rehab  Date 08/19/21       Visit Diagnosis: S/P PTCA (percutaneous transluminal coronary angioplasty)  Patient's Home Medications on Admission:  Current Outpatient Medications:    acetaminophen (TYLENOL) 325 MG tablet, Take 2 tablets (650 mg total) by mouth every 6 (six) hours as needed for fever., Disp: 20 tablet, Rfl: 0   acidophilus (RISAQUAD) CAPS capsule, Take 2 capsules by mouth 3 (three) times daily. (Patient not taking: Reported on 08/02/2021), Disp: 30 capsule, Rfl: 0   apixaban (ELIQUIS) 5 MG TABS tablet, Take by mouth., Disp: , Rfl:    atorvastatin (LIPITOR) 80 MG tablet, Take 80 mg by mouth daily., Disp: , Rfl:    brimonidine (ALPHAGAN) 0.2 % ophthalmic solution, Place 1 drop into both eyes 2 (two) times daily., Disp: 5 mL, Rfl: 12   Calcipotriene-Betameth Diprop (ENSTILAR) 0.005-0.064 % FOAM, APPLY THIN LAYER TO AFFECTED AREAS ONCE DAILY UNTIL FLAT AND ASYMPTOMATIC, MAX 4 WEEKS (Patient not taking: Reported on 08/02/2021), Disp: , Rfl:    Chlorhexidine Gluconate Cloth 2 % PADS, Apply 6 each topically daily. (Patient not taking: Reported on 08/02/2021), Disp: 1 each, Rfl: 0   clonazePAM (KLONOPIN) 0.25 MG disintegrating tablet, Take 1-2 tablets (0.25-0.5 mg total) by mouth 3 (three) times daily as needed (anxiety/agitation)., Disp: 20 tablet, Rfl: 0   clopidogrel (PLAVIX) 75 MG tablet, Take 1 tablet (75 mg total) by mouth daily., Disp: 30 tablet, Rfl: 0   diclofenac Sodium (VOLTAREN) 1 % GEL, Apply 4 g topically 4 (four) times daily., Disp: , Rfl:    docusate sodium (COLACE) 100 MG capsule, Take 1 capsule (100 mg  total) by mouth 2 (two) times daily., Disp: 10 capsule, Rfl: 0   DULoxetine (CYMBALTA) 60 MG capsule, Take 1 capsule (60 mg total) by mouth 2 (two) times daily. (Patient not taking: No sig reported), Disp: 20 capsule, Rfl: 0   esomeprazole (NEXIUM) 40 MG capsule, Take 1 capsule by mouth daily. (Patient not taking: No sig reported), Disp: , Rfl:    famotidine (PEPCID) 40 MG tablet, Take 1 tablet by mouth every morning. (Patient not taking: Reported on 08/02/2021), Disp: , Rfl:    ferrous sulfate 325 (65 FE) MG EC tablet, Take 1 tablet by mouth daily with breakfast., Disp: , Rfl:    folic acid (FOLVITE) 1 MG tablet, Take 1 tablet (1 mg total) by mouth daily. (Patient not taking: Reported on 08/02/2021), Disp: 30 tablet, Rfl: 0   furosemide (LASIX) 20 MG tablet, Take 20 mg by mouth daily as needed (Edema (Or weight gain of >2lbs overnight or 5lbs in one week))., Disp: , Rfl:    glipiZIDE (GLUCOTROL) 10 MG tablet, Take 5 mg by mouth daily before breakfast., Disp: , Rfl:    guaiFENesin (MUCINEX) 600 MG 12 hr tablet, Take 1 tablet (600 mg total) by mouth 2 (two) times daily. (Patient not taking: Reported on 08/02/2021), Disp: 30 tablet, Rfl: 0   isosorbide mononitrate (IMDUR) 30 MG 24 hr tablet, Take 1 tablet (30 mg total) by mouth daily. (Patient not taking: Reported on 08/02/2021), Disp: 30 tablet, Rfl: 0   ketoconazole (NIZORAL) 2 % shampoo, Apply topically., Disp: ,  Rfl:    latanoprost (XALATAN) 0.005 % ophthalmic solution, Place 1 drop into both eyes at bedtime., Disp: 2.5 mL, Rfl: 12   Magnesium 250 MG TABS, Take by mouth. (Patient not taking: No sig reported), Disp: , Rfl:    metoprolol succinate (TOPROL-XL) 25 MG 24 hr tablet, Take 1 tablet by mouth at bedtime., Disp: , Rfl:    Multiple Vitamin (MULTIVITAMIN WITH MINERALS) TABS tablet, Take 1 tablet by mouth daily., Disp: 30 tablet, Rfl: 0   nitroGLYCERIN (NITROSTAT) 0.4 MG SL tablet, Place 1 tablet (0.4 mg total) under the tongue every 5 (five)  minutes as needed for chest pain., Disp: 20 tablet, Rfl: 12   omeprazole (PRILOSEC) 40 MG capsule, Take 40 mg by mouth daily., Disp: , Rfl:    ondansetron (ZOFRAN) 4 MG/2ML SOLN injection, Inject 2 mLs (4 mg total) into the vein every 6 (six) hours as needed for nausea. (Patient not taking: Reported on 08/02/2021), Disp: 2 mL, Rfl: 0   phenol (CHLORASEPTIC) 1.4 % LIQD, Use as directed 1 spray in the mouth or throat as needed for throat irritation / pain. (Patient not taking: Reported on 08/02/2021), Disp: 10 mL, Rfl: 0   polyethylene glycol (MIRALAX / GLYCOLAX) 17 g packet, Take 17 g by mouth daily as needed for mild constipation or moderate constipation., Disp: 14 each, Rfl: 0   potassium chloride SA (KLOR-CON) 20 MEQ tablet, Take by mouth., Disp: , Rfl:    ranolazine (RANEXA) 500 MG 12 hr tablet, Take 500 mg by mouth 2 (two) times daily. (Patient not taking: Reported on 08/02/2021), Disp: , Rfl:    SENNOSIDES PO, Take by mouth. (Patient not taking: No sig reported), Disp: , Rfl:    spironolactone (ALDACTONE) 25 MG tablet, Take by mouth., Disp: , Rfl:    sucralfate (CARAFATE) 1 g tablet, Take 1 tablet by mouth 4 (four) times daily -  before meals and at bedtime. (Patient not taking: No sig reported), Disp: , Rfl:    thiamine 100 MG tablet, Take 1 tablet (100 mg total) by mouth daily., Disp: 30 tablet, Rfl: 0  Past Medical History: Past Medical History:  Diagnosis Date   Alcohol use    "cutting back" but still heavy and daily, multiple shots of bourbon each night   Diabetes mellitus without complication ([place])    History of tobacco use    reportedly quit in 2013   Hypertension    Pulmonary embolism ([place]) 2012   unprovoked   PVD (peripheral vascular disease) ([place])     Tobacco Use: Social History   Tobacco Use  Smoking Status Former   Packs/day: 2.00   Years: 40.00   Pack years: 80.00   Types: Cigarettes   Quit date: 11/27/2010   Years since quitting: 10.8  Smokeless Tobacco Never   Tobacco Comments   Quit in 2012    Labs: Recent Review Flowsheet Data     Labs for ITP Cardiac and Pulmonary Rehab Latest Ref Rng & Units 01/28/2019 01/29/2019 06/18/2021 06/18/2021 06/18/2021   Cholestrol 0 - 200 mg/dL 168 143 - - -   LDLCALC 0 - 99 mg/dL 113(H) 93 - - -   HDL >40 mg/dL 36(L) 33(L) - - -   Trlycerides <150 mg/dL 97 87 - - -   Hemoglobin A1c 4.8 - 5.6 % - - - - 6.1(H)   PHART 7.350 - 7.450 - - 7.58(H) 7.48(H) -   PCO2ART 32.0 - 48.0 mmHg - - 28(L) 47 -   HCO3  20.0 - 28.0 mmol/L - - 26.3 35.0(H) -   O2SAT % - - 99.9 99.0 -        Exercise Target Goals: Exercise Program Goal: Individual exercise prescription set using results from initial 6 min walk test and THRR while considering  patients activity barriers and safety.   Exercise Prescription Goal: Initial exercise prescription builds to 30-45 minutes a day of aerobic activity, 2-3 days per week.  Home exercise guidelines will be given to patient during program as part of exercise prescription that the participant will acknowledge.   Education: Aerobic Exercise: - Group verbal and visual presentation on the components of exercise prescription. Introduces F.I.T.T principle from ACSM for exercise prescriptions.  Reviews F.I.T.T. principles of aerobic exercise including progression. Written material given at graduation. Flowsheet Row Cardiac Rehab from 09/19/2019 in [facility] Cardiac and Pulmonary Rehab  Date 08/22/19  Educator jh  Instruction Review Code 1- Verbalizes Understanding       Education: Resistance Exercise: - Group verbal and visual presentation on the components of exercise prescription. Introduces F.I.T.T principle from ACSM for exercise prescriptions  Reviews F.I.T.T. principles of resistance exercise including progression. Written material given at graduation.    Education: Exercise & Equipment Safety: - Individual verbal instruction and demonstration of equipment use and safety with use of the  equipment. Flowsheet Row Cardiac Rehab from 10/07/2021 in [facility] Cardiac and Pulmonary Rehab  Date 08/19/21  Educator [place]  Instruction Review Code 1- Verbalizes Understanding       Education: Exercise Physiology & General Exercise Guidelines: - Group verbal and written instruction with models to review the exercise physiology of the cardiovascular system and associated critical values. Provides general exercise guidelines with specific guidelines to those with heart or lung disease.  Flowsheet Row Cardiac Rehab from 10/07/2021 in [facility] Cardiac and Pulmonary Rehab  Date 10/07/21  Educator [employer]  Instruction Review Code 1- Verbalizes Understanding       Education: Flexibility, Balance, Mind/Body Relaxation: - Group verbal and visual presentation with interactive activity on the components of exercise prescription. Introduces F.I.T.T principle from ACSM for exercise prescriptions. Reviews F.I.T.T. principles of flexibility and balance exercise training including progression. Also discusses the mind body connection.  Reviews various relaxation techniques to help reduce and manage stress (i.e. Deep breathing, progressive muscle relaxation, and visualization). Balance handout provided to take home. Written material given at graduation. 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    Cardiac Risk Stratification High             6 Minute Walk:   Oxygen Initial Assessment:   Oxygen  Re-Evaluation:   Oxygen Discharge (Final Oxygen Re-Evaluation):   Initial Exercise Prescription:  Initial Exercise Prescription - 08/19/21 1400       Date of Initial Exercise RX and Referring Provider   Date 08/19/21    Referring Provider Cloretta Ned MD      Oxygen   Maintain Oxygen Saturation 88% or higher      Recumbant Bike   Level 1    RPM 60  Minutes 15    METs 1.7      NuStep   Level 1    SPM 80    Minutes 15    METs 1.7      Biostep-RELP   Level 1    SPM 50    Minutes 15    METs 1.7      Track   Laps 5   as tolerated   Minutes 15    METs 1.2      Prescription Details   Frequency (times per week) 2    Duration Progress to 30 minutes of continuous aerobic without signs/symptoms of physical distress      Intensity   THRR 40-80% of Max Heartrate 104-136    Ratings of Perceived Exertion 11-15    Perceived Dyspnea 0-4      Resistance Training   Training Prescription Yes    Weight 3 lb   ROM on left   Reps 10-15             Perform Capillary Blood Glucose checks as needed.  Exercise Prescription Changes:   Exercise Prescription Changes     Row Name 08/19/21 1400 08/30/21 1400 09/13/21 1000 09/28/21 1200       Response to Exercise   Blood Pressure (Admit) 136/70 126/72 140/68 128/64    Blood Pressure (Exercise) 148/76 126/64 -- 138/70    Blood Pressure (Exit) 128/76 120/70 124/69 122/62    Heart Rate (Admit) 73 bpm 130 bpm 76 bpm 74 bpm    Heart Rate (Exercise) 107 bpm 130 bpm 90 bpm 128 bpm    Heart Rate (Exit) 74 bpm 84 bpm 75 bpm 77 bpm    Oxygen Saturation (Admit) 99 % -- -- --    Oxygen Saturation (Exercise) 100 % -- -- --    Oxygen Saturation (Exit) 98 % -- -- --    Rating of Perceived Exertion (Exercise) '15 15 11 12    ' Perceived Dyspnea (Exercise) 1 -- -- --    Symptoms chest tightness 2/10, fatigue -- -- --    Comments walk test results -- -- --    Duration Continue with 30 min of aerobic exercise without signs/symptoms  of physical distress. Continue with 30 min of aerobic exercise without signs/symptoms of physical distress. Continue with 30 min of aerobic exercise without signs/symptoms of physical distress. Continue with 30 min of aerobic exercise without signs/symptoms of physical distress.    Intensity THRR unchanged THRR unchanged THRR unchanged THRR unchanged      Progression   Progression -- Continue to progress workloads to maintain intensity without signs/symptoms of physical distress. Continue to progress workloads to maintain intensity without signs/symptoms of physical distress. Continue to progress workloads to maintain intensity without signs/symptoms of physical distress.    Average METs -- 2 2 1.4      Resistance Training   Training Prescription -- Yes Yes Yes    Weight -- 3 lb 3 lb 3 lb    Reps -- 10-15 10-15 10-15      Interval Training   Interval Training -- No No No      NuStep   Level -- '3 3 3    ' Minutes -- '15 15 15    ' METs -- 2 -- 1.6      REL-XR   Level -- -- 1 --    Minutes -- -- 15 --      Biostep-RELP   Level -- 1 -- 1    Minutes --  15 -- 15    METs -- 2 -- 1.2             Exercise Comments:   Exercise Comments     Row Name 08/26/21 1107           Exercise Comments First full day of exercise!  Patient was oriented to gym and equipment including functions, settings, policies, and procedures.  Patient's individual exercise prescription and treatment plan were reviewed.  All starting workloads were established based on the results of the 6 minute walk test done at initial orientation visit.  The plan for exercise progression was also introduced and progression will be customized based on patient's performance and goals.                Exercise Goals and Review:   Exercise Goals     Row Name 08/19/21 1500             Exercise Goals   Increase Physical Activity Yes       Intervention Provide advice, education, support and counseling about physical  activity/exercise needs.;Develop an individualized exercise prescription for aerobic and resistive training based on initial evaluation findings, risk stratification, comorbidities and participant's personal goals.       Expected Outcomes Short Term: Attend rehab on a regular basis to increase amount of physical activity.;Long Term: Add in home exercise to make exercise part of routine and to increase amount of physical activity.;Long Term: Exercising regularly at least 3-5 days a week.       Increase Strength and Stamina Yes       Intervention Provide advice, education, support and counseling about physical activity/exercise needs.;Develop an individualized exercise prescription for aerobic and resistive training based on initial evaluation findings, risk stratification, comorbidities and participant's personal goals.       Expected Outcomes Short Term: Increase workloads from initial exercise prescription for resistance, speed, and METs.;Short Term: Perform resistance training exercises routinely during rehab and add in resistance training at home;Long Term: Improve cardiorespiratory fitness, muscular endurance and strength as measured by increased METs and functional capacity (6MWT)       Able to understand and use rate of perceived exertion (RPE) scale Yes       Intervention Provide education and explanation on how to use RPE scale       Expected Outcomes Short Term: Able to use RPE daily in rehab to express subjective intensity level;Long Term:  Able to use RPE to guide intensity level when exercising independently       Able to understand and use Dyspnea scale Yes       Intervention Provide education and explanation on how to use Dyspnea scale       Expected Outcomes Short Term: Able to use Dyspnea scale daily in rehab to express subjective sense of shortness of breath during exertion;Long Term: Able to use Dyspnea scale to guide intensity level when exercising independently       Knowledge and  understanding of Target Heart Rate Range (THRR) Yes       Intervention Provide education and explanation of THRR including how the numbers were predicted and where they are located for reference       Expected Outcomes Short Term: Able to state/look up THRR;Short Term: Able to use daily as guideline for intensity in rehab;Long Term: Able to use THRR to govern intensity when exercising independently       Able to check pulse independently Yes  Intervention Provide education and demonstration on how to check pulse in carotid and radial arteries.;Review the importance of being able to check your own pulse for safety during independent exercise       Expected Outcomes Short Term: Able to explain why pulse checking is important during independent exercise;Long Term: Able to check pulse independently and accurately       Understanding of Exercise Prescription Yes       Intervention Provide education, explanation, and written materials on patient's individual exercise prescription       Expected Outcomes Short Term: Able to explain program exercise prescription;Long Term: Able to explain home exercise prescription to exercise independently                Exercise Goals Re-Evaluation :  Exercise Goals Re-Evaluation     Row Name 08/26/21 1108 08/30/21 1422 09/13/21 1023 09/28/21 1114       Exercise Goal Re-Evaluation   Exercise Goals Review Able to understand and use rate of perceived exertion (RPE) scale;Knowledge and understanding of Target Heart Rate Range (THRR);Understanding of Exercise Prescription;Increase Physical Activity;Increase Strength and Stamina;Able to understand and use Dyspnea scale;Able to check pulse independently Increase Physical Activity;Increase Strength and Stamina Increase Physical Activity;Increase Strength and Stamina Increase Physical Activity;Increase Strength and Stamina;Able to understand and use rate of perceived exertion (RPE) scale;Able to understand and use  Dyspnea scale;Knowledge and understanding of Target Heart Rate Range (THRR);Able to check pulse independently;Understanding of Exercise Prescription    Comments Reviewed RPE and dyspnea scales, THR and program prescription with pt today.  Pt voiced understanding and was given a copy of goals to take home. Today was Damon English second session. he workes at RPE 14-15.  He reached THR range.  We will monitor progress. Damon English has been having trouble with getting his blood sugars up before exercise and therefore has not been able to complete a full exercise session due to limited time. He is on T4 Nustep for level 3 and is tolerating his current exercise prescription. Will continue to monitor and hope he can manage his blood sugars better with exercise. Reviewed home exercise with pt today.  Pt plans to walk and use staff videos at home for exercise.  Reviewed THR, pulse, RPE, sign and symptoms, pulse oximetery and when to call 911 or MD.  Also discussed weather considerations and indoor options.  Pt voiced understanding. He will also keep close eye on his sugars.    Expected Outcomes Short: Use RPE daily to regulate intensity. Long: Follow program prescription in THR. Short:  attend consistently Long:  improve overall stamina Short: Manage blood glucose better prior to exercise Long: Gradually increase overall MET level Short: Start to add  in more exercise at home Long: Continue to move more.             Discharge Exercise Prescription (Final Exercise Prescription Changes):  Exercise Prescription Changes - 09/28/21 1200       Response to Exercise   Blood Pressure (Admit) 128/64    Blood Pressure (Exercise) 138/70    Blood Pressure (Exit) 122/62    Heart Rate (Admit) 74 bpm    Heart Rate (Exercise) 128 bpm    Heart Rate (Exit) 77 bpm    Rating of Perceived Exertion (Exercise) 12    Duration Continue with 30 min of aerobic exercise without signs/symptoms of physical distress.    Intensity THRR unchanged       Progression   Progression Continue to progress workloads to maintain  intensity without signs/symptoms of physical distress.    Average METs 1.4      Resistance Training   Training Prescription Yes    Weight 3 lb    Reps 10-15      Interval Training   Interval Training No      NuStep   Level 3    Minutes 15    METs 1.6      Biostep-RELP   Level 1    Minutes 15    METs 1.2             Nutrition:  Target Goals: Understanding of nutrition guidelines, daily intake of sodium <1538m, cholesterol <2036m calories 30% from fat and 7% or less from saturated fats, daily to have 5 or more servings of fruits and vegetables.  Education: All About Nutrition: -Group instruction provided by verbal, written material, interactive activities, discussions, models, and posters to present general guidelines for heart healthy nutrition including fat, fiber, MyPlate, the role of sodium in heart healthy nutrition, utilization of the nutrition label, and utilization of this knowledge for meal planning. Follow up email sent as well. Written material given at graduation. [facility]  Date 09/02/21  [facility]ruction Review Code 1- Verbalizes Understanding       Biometrics:  Pre Biometrics - 08/19/21 1436       Pre Biometrics   Height 5' 6.5" (1.689 m)    Weight 193 lb 4.8 oz (87.7 kg)    BMI (Calculated) 30.74              Nutrition Therapy Plan and Nutrition Goals:  Nutrition Therapy & Goals - 09/08/21 1331       Nutrition Therapy   Diet Heart healthy, low Na, diabetes friendly    Drug/Food Interactions Statins/Certain Fruits    Protein (specify units) 70g    Fiber 25 grams    Whole Grain Foods 3 servings    Saturated Fats 12 max. grams    Fruits and Vegetables 5 servings/day    Sodium 1.5 grams      Personal Nutrition Goals   Nutrition Goal ST: pair carbohydrates with fiber, fat, and  protein. Eat 2-4 CHO per meal. LT: maintain heart healthy changes, limit BG lows    Comments T2DM controlled by diet. BG has been 85-90 1st thing in the morning and in the day 125-130. He reports his wife said he is not eating as much as he used to. He is concerned about his weight - he feels like his weight is going down unintentionally. Wife makes cookies with flaxseeds (1 per day throughout the day). B: cereal (multigrain cheerios with almond milk with blueberries, strawberries, or banana) or eggs with 1 slice of whole wheat toast at home or at the grill. He does not eat bacon at all anymore and he will eat sausage rarely. OJ with lemonade. L: leftovers or sandiwch with whole wheat bread (tu[place]ith lite mayo and lettuce and tomato) or going out to eat (grilled food like burgers at mom and pop place and some vegetables) D: grilled chicken, grilled fish with a salad or another vegetable (he eats a variety), when going out to eat will get baked potato and eat half. He loves sweet potatoes and zucchini. Tomato based foods can give him reflux. Drinks: pepsi zero (2: 16 oz bottle). He used to drink liquor, but has stopped. He goes out to eat 3-4x/week and will have  leftovers from that. Reviewed heart healthy eating, how eating at home can help to limit Na, increase whole grains, increase variety, and allows use of healthy oils/fats such as olive oil which he mentioned is all they use at home. Discussed diabetes friendly eating.      Intervention Plan   Intervention Prescribe, educate and counsel regarding individualized specific dietary modifications aiming towards targeted core components such as weight, hypertension, lipid management, diabetes, heart failure and other comorbidities.    Expected Outcomes Short Term Goal: Understand basic principles of dietary content, such as calories, fat, sodium, cholesterol and nutrients.;Short Term Goal: A plan has been developed with personal nutrition goals set during  dietitian appointment.;Long Term Goal: Adherence to prescribed nutrition plan.             Nutrition Assessments:  MEDIFICTS Score Key: ?70 Need to make dietary changes  40-70 Heart Healthy Diet ? 40 Therapeutic Level Cholesterol Diet  Flowsheet Row Cardiac Rehab from 08/19/2021 in [facility] Cardiac and Pulmonary Rehab  Picture Your Plate Total Score on Admission 76      Picture Your Plate Scores: <29 Unhealthy dietary pattern with much room for improvement. 41-50 Dietary pattern unlikely to meet recommendations for good health and room for improvement. 51-60 More healthful dietary pattern, with some room for improvement.  >60 Healthy dietary pattern, although there may be some specific behaviors that could be improved.    Nutrition Goals Re-Evaluation:  Nutrition Goals Re-Evaluation     [place] Name 09/28/21 1118             Goals   Nutrition Goal ST: pair carbohydrates with fiber, fat, and protein. Eat 2-4 CHO per meal. LT: maintain heart healthy changes, limit BG lows       Comment Jaiveon is doing well in rehab.  He is keeping his weight down and sticks to a healthy diet for the most part.  He has been lots of leftovers from Thanksgiving.  He has been eating lots of eggs and enjoys them.  He has cut out the bacon as it was causing him to hurt.  He is getting lots of vegetables       Expected Outcome Short: Continue to focus on healthy eating. Long: Continue to montior salt levels                Nutrition Goals Discharge (Final Nutrition Goals Re-Evaluation):  Nutrition Goals Re-Evaluation - 09/28/21 1118       Goals   Nutrition Goal ST: pair carbohydrates with fiber, fat, and protein. Eat 2-4 CHO per meal. LT: maintain heart healthy changes, limit BG lows    Comment Damon English is doing well in rehab.  He is keeping his weight down and sticks to a healthy diet for the most part.  He has been lots of leftovers from Thanksgiving.  He has been eating lots of eggs and enjoys them.   He has cut out the bacon as it was causing him to hurt.  He is getting lots of vegetables    Expected Outcome Short: Continue to focus on healthy eating. Long: Continue to montior salt levels             Psychosocial: Target Goals: Acknowledge presence or absence of significant depression and/or stress, maximize coping skills, provide positive support system. Participant is able to verbalize types and ability to use techniques and skills needed for reducing stress and depression.   Education: Stress, Anxiety, and Depression - Group verbal and visual presentation to  define topics covered.  Reviews how body is impacted by stress, anxiety, and depression.  Also discusses healthy ways to reduce stress and to treat/manage anxiety and depression.  Written material given at graduation. Flowsheet Row Cardiac Rehab from 10/07/2021 in [facility] Cardiac and Pulmonary Rehab  Date 09/30/21  Educator [facility]  Instruction Review Code 1- Verbalizes Understanding       Education: Sleep Hygiene -Provides group verbal and written instruction about how sleep can affect your health.  Define sleep hygiene, discuss sleep cycles and impact of sleep habits. Review good sleep hygiene tips.    Initial Review & Psychosocial Screening:  Initial Psych Review & Screening - 08/02/21 1544       Initial Review   Current issues with Current Stress Concerns;Current Sleep Concerns    Source of Stress Concerns Chronic Illness      Family Dynamics   Good Support System? Yes   wife     Barriers   Psychosocial barriers to participate in program There are no identifiable barriers or psychosocial needs.;The patient should benefit from training in stress management and relaxation.      Screening Interventions   Interventions Encouraged to exercise;Provide feedback about the scores to participant;To provide support and resources with identified psychosocial needs    Expected Outcomes Short Term goal: Utilizing psychosocial  counselor, staff and physician to assist with identification of specific Stressors or current issues interfering with healing process. Setting desired goal for each stressor or current issue identified.;Long Term Goal: Stressors or current issues are controlled or eliminated.;Short Term goal: Identification and review with participant of any Quality of Life or Depression concerns found by scoring the questionnaire.;Long Term goal: The participant improves quality of Life and PHQ9 Scores as seen by post scores and/or verbalization of changes             Quality of Life Scores:   Quality of Life - 08/19/21 1434       Quality of Life   Select Quality of Life      Quality of Life Scores   Health/Function Pre 17.13 %    Socioeconomic Pre 28.29 %    Psych/Spiritual Pre 22.29 %    Family Pre 27.6 %    GLOBAL Pre 22.03 %            Scores of 19 and below usually indicate a poorer quality of life in these areas.  A difference of  2-3 points is a clinically meaningful difference.  A difference of 2-3 points in the total score of the Quality of Life Index has been associated with significant improvement in overall quality of life, self-image, physical symptoms, and general health in studies assessing change in quality of life.  PHQ-9: Recent Review Flowsheet Data     Depression screen [facility] 2/9 08/19/2021 10/01/2019 07/02/2019 05/31/2019   Decreased Interest 0 0 0 0   Down, Depressed, Hopeless 0 0 0 1   PHQ - 2 Score 0 0 0 1   Altered sleeping 1 - 1 3   Tired, decreased energy '2 1 1 3   ' Change in appetite 0 0 0 0   Feeling bad or failure about yourself  0 1 0 0   Trouble concentrating 0 0 0 0   Moving slowly or fidgety/restless '1 3 1 ' 0   Suicidal thoughts 0 0 0 0   PHQ-9 Score 4 - 3 7   Difficult doing work/chores Somewhat difficult Somewhat difficult Not difficult at all Somewhat difficult  Interpretation of Total Score  Total Score Depression Severity:  1-4 = Minimal  depression, 5-9 = Mild depression, 10-14 = Moderate depression, 15-19 = Moderately severe depression, 20-27 = Severe depression   Psychosocial Evaluation and Intervention:  Psychosocial Evaluation - 08/02/21 1608       Psychosocial Evaluation & Interventions   Interventions Encouraged to exercise with the program and follow exercise prescription;Stress management education;Relaxation education    Comments 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 He stated he felt great after graduation and wants to get back to that feeling. He does have balance concerns related to his neuropathy and uses a walker sometimes so that hinders his exercise at times. His wife is very supportive and manages his health care. They both want him to have increased stamina and is hopeful this program will help him heal. He does state that he has had night terrors that have woken him up with chest pain and requiring a nitro. His MD is aware and has encouraged him to take a Klonopin nightly which has seemed to help some.    Expected Outcomes Short: attend cardiac rehab for education and exercise. Long: develop and maintain positive self care habits.    Continue Psychosocial Services  Follow up required by staff             Psychosocial Re-Evaluation:  Psychosocial Re-Evaluation     [place] Name 09/28/21 1115             Psychosocial Re-Evaluation   Current issues with Current Stress Concerns       Comments Damon English is doing well in rehab.  He tries to deal with is stressors from time to time.  They have been tackling Christmas shopping and get frustrated with traffic and people.  He choose to eat and then just sit in the car to relax and not have to deal with the people. He is sleeping good and uses  his klonopin at night but it helps him sleep and keeps his vivid dreams to a minimum.  He does have issues with his blood sugars and breathing which can get frustrating, but he does the best he can with it.       Expected Outcomes Short: Continue to exerise for mental boost Long: Conitnue to stay positive       Interventions Encouraged to attend Cardiac Rehabilitation for the exercise       Continue Psychosocial Services  Follow up required by staff                Psychosocial Discharge (Final Psychosocial Re-Evaluation):  Psychosocial Re-Evaluation - 09/28/21 1115       Psychosocial Re-Evaluation   Current issues with Current Stress Concerns    Comments Damon English is doing well in rehab.  He tries to deal with is stressors from time to time.  They have been tackling Christmas shopping and get frustrated with traffic and people.  He choose to eat and then just sit in the car to relax and not have to deal with the people. He is sleeping good and uses his klonopin at night but it helps him sleep and keeps his vivid dreams to a minimum.  He does have issues with his blood sugars  and breathing which can get frustrating, but he does the best he can with it.    Expected Outcomes Short: Continue to exerise for mental boost Long: Conitnue to stay positive    Interventions Encouraged to attend Cardiac Rehabilitation for the exercise    Continue Psychosocial Services  Follow up required by staff             Vocational Rehabilitation: Provide vocational rehab assistance to qualifying candidates.   Vocational Rehab Evaluation & Intervention:  Vocational Rehab - 08/02/21 1544       Initial Vocational Rehab Evaluation & Intervention   Assessment shows need for Vocational Rehabilitation No             Education: Education Goals: Education classes will be provided on a variety of topics geared toward better understanding of heart health and risk factor modification. Participant will state  understanding/return demonstration of topics presented as noted by education test scores.  Learning Barriers/Preferences:  Learning Barriers/Preferences - 08/02/21 1541       Learning Barriers/Preferences   Learning Barriers None    Learning Preferences None             General Cardiac Education Topics:  AED/CPR: - Group verbal and written instruction with the use of models to demonstrate the basic use of the AED with the basic ABC's of resuscitation.   Anatomy and Cardiac Procedures: - Group verbal and visual presentation and models provide information about basic cardiac anatomy and function. Reviews the testing methods done to diagnose heart disease and the outcomes of the test results. Describes the treatment choices: Medical Management, Angioplasty, or Coronary Bypass Surgery for treating various heart conditions including Myocardial Infarction, Angina, Valve Disease, and Cardiac Arrhythmias.  Written material given at graduation.   Medication Safety: - Group verbal and visual instruction to review commonly prescribed medications for heart and lung disease. Reviews the medication, class of the drug, and side effects. Includes the steps to properly store meds and maintain the prescription regimen.  Written material given at graduation.   Intimacy: - Group verbal instruction through game format to discuss how heart and lung disease can affect sexual intimacy. Written material given at graduation..   Know Your Numbers and Heart Failure: - Group verbal and visual instruction to discuss disease risk factors for cardiac and pulmonary disease and treatment options.  Reviews associated critical values for Overweight/Obesity, Hypertension, Cholesterol, and Diabetes.  Discusses basics of heart failure: signs/symptoms and treatments.  Introduces Heart Failure Zone chart for action plan for heart failure.  Written material given at graduation. Flowsheet Row Cardiac Rehab from 10/07/2021  in [facility] Cardiac and Pulmonary Rehab  Date 09/16/21  Educator [place]  Instruction Review Code 1- Verbalizes Understanding       Infection Prevention: - Provides verbal and written material to individual with discussion of infection control including proper hand washing and proper equipment cleaning during exercise session. Flowsheet Row Cardiac Rehab from 10/07/2021 in [facility] Cardiac and Pulmonary Rehab  Date 08/19/21  Educator [place]  Instruction Review Code 1- Verbalizes Understanding       Falls Prevention: - Provides verbal and written material to individual with discussion of falls prevention and safety. Flowsheet Row Cardiac Rehab from 10/07/2021 in [facility] Cardiac and Pulmonary Rehab  Date 08/19/21  Educator [place]  Instruction Review Code 2- Demonstrated Understanding       Other: -Provides group and verbal instruction on various topics (see comments)   Knowledge Questionnaire Score:  Knowledge Questionnaire Score - 08/19/21  1434       Knowledge Questionnaire Score   Pre Score 25/26: Exercise             Core Components/Risk Factors/Patient Goals at Admission:  Personal Goals and Risk Factors at Admission - 08/19/21 1502       Core Components/Risk Factors/Patient Goals on Admission    Weight Management Yes;Weight Loss    Intervention Weight Management: Develop a combined nutrition and exercise program designed to reach desired caloric intake, while maintaining appropriate intake of nutrient and fiber, sodium and fats, and appropriate energy expenditure required for the weight goal.;Weight Management: Provide education and appropriate resources to help participant work on and attain dietary goals.;Weight Management/Obesity: Establish reasonable short term and long term weight goals.    Admit Weight 193 lb (87.5 kg)    Goal Weight: Short Term 188 lb (85.3 kg)    Goal Weight: Long Term 180 lb (81.6 kg)    Expected Outcomes Short Term: Continue to assess and modify  interventions until short term weight is achieved;Long Term: Adherence to nutrition and physical activity/exercise program aimed toward attainment of established weight goal;Weight Loss: Understanding of general recommendations for a balanced deficit meal plan, which promotes 1-2 lb weight loss per week and includes a negative energy balance of [phone] kcal/d;Understanding recommendations for meals to include 15-35% energy as protein, 25-35% energy from fat, 35-60% energy from carbohydrates, less than 237m of dietary cholesterol, 20-35 gm of total fiber daily;Understanding of distribution of calorie intake throughout the day with the consumption of 4-5 meals/snacks    Diabetes Yes    Intervention Provide education about signs/symptoms and action to take for hypo/hyperglycemia.;Provide education about proper nutrition, including hydration, and aerobic/resistive exercise prescription along with prescribed medications to achieve blood glucose in normal ranges: Fasting glucose 65-99 mg/dL    Expected Outcomes Short Term: Participant verbalizes understanding of the signs/symptoms and immediate care of hyper/hypoglycemia, proper foot care and importance of medication, aerobic/resistive exercise and nutrition plan for blood glucose control.;Long Term: Attainment of HbA1C < 7%.    Hypertension Yes    Intervention Provide education on lifestyle modifcations including regular physical activity/exercise, weight management, moderate sodium restriction and increased consumption of fresh fruit, vegetables, and low fat dairy, alcohol moderation, and smoking cessation.;Monitor prescription use compliance.    Expected Outcomes Short Term: Continued assessment and intervention until BP is < 140/937mHG in hypertensive participants. < 130/8067mG in hypertensive participants with diabetes, heart failure or chronic kidney disease.;Long Term: Maintenance of blood pressure at goal levels.    Lipids Yes    Intervention Provide  education and support for participant on nutrition & aerobic/resistive exercise along with prescribed medications to achieve LDL <44m75mDL >40mg15m Expected Outcomes Short Term: Participant states understanding of desired cholesterol values and is compliant with medications prescribed. Participant is following exercise prescription and nutrition guidelines.;Long Term: Cholesterol controlled with medications as prescribed, with individualized exercise RX and with personalized nutrition plan. Value goals: LDL < 44mg,42m > 40 mg.             Education:Diabetes - Individual verbal and written instruction to review signs/symptoms of diabetes, desired ranges of glucose level fasting, after meals and with exercise. Acknowledge that pre and post exercise glucose checks will be done for 3 sessions at entry of program. [facility]  Education need identified 08/19/21  Date 08/19/21  Educator [place]  [place] Code 1- Verbalizes Understanding  Core Components/Risk Factors/Patient Goals Review:   Goals and Risk Factor Review     Row Name 09/28/21 1120             Core Components/Risk Factors/Patient Goals Review   Personal Goals Review Weight Management/Obesity;Hypertension;Diabetes       Review Damon English is doing well in rehab. His weight has snuck up over Thanksgiving, but it is coming back down again.  His pressures are doing well and he keeps an eye on it at home.  His sugars have been up and down in class and at home.  He has stopped his glipizide and that seems to help, but still having some issues.  He will continue to keep a close eye on his sugars.       Expected Outcomes Short: Continue to work on blood sugar control Long: Continue to improve diabetes management                Core Components/Risk Factors/Patient Goals at Discharge (Final Review):   Goals and Risk Factor Review - 09/28/21 1120        Core Components/Risk Factors/Patient Goals Review   Personal Goals Review Weight Management/Obesity;Hypertension;Diabetes    Review Damon English is doing well in rehab. His weight has snuck up over Thanksgiving, but it is coming back down again.  His pressures are doing well and he keeps an eye on it at home.  His sugars have been up and down in class and at home.  He has stopped his glipizide and that seems to help, but still having some issues.  He will continue to keep a close eye on his sugars.    Expected Outcomes Short: Continue to work on blood sugar control Long: Continue to improve diabetes management             ITP Comments:  ITP Comments     Row Name 08/02/21 1534 08/19/21 1410 08/19/21 1411 08/24/21 1117 08/26/21 1107   ITP Comments Initial telephone orientation completed. Diagnosis can be found in [facility] 9/26. EP orientation scheduled for Thursday 11/3 at 10am. Completed 6MWT and gym orientation. Initial ITP created and sent for review to Dr. Emily Filbert, Medical Director. EKG strips sent to Dr. Edwin Dada. Spoke with Dr. Edwin Dada on the phone, per MD- Patient strips are OK, he is pacing. No concern with anything else, patient OK to start exercise. First full day of exercise!  Patient was oriented to gym and equipment including functions, settings, policies, and procedures.  Patient's individual exercise prescription and treatment plan were reviewed.  All starting workloads were established based on the results of the 6 minute walk test done at initial orientation visit.  The plan for exercise progression was also introduced and progression will be customized based on patient's performance and goals.    Row Name 09/08/21 1430 09/15/21 0624 10/13/21 0644       ITP Comments Completed initial RD consultation 30 Day review completed. Medical Director ITP review done, changes made as directed, and signed approval by Medical Director. 30 Day review completed. Medical Director ITP review done, changes  made as directed, and signed approval by Medical Director.              Comments:

## 2021-10-14 DIAGNOSIS — I5032 Chronic diastolic (congestive) heart failure: Secondary | ICD-10-CM | POA: Diagnosis not present

## 2021-10-14 DIAGNOSIS — Z950 Presence of cardiac pacemaker: Secondary | ICD-10-CM | POA: Diagnosis not present

## 2021-10-14 DIAGNOSIS — I739 Peripheral vascular disease, unspecified: Secondary | ICD-10-CM | POA: Diagnosis not present

## 2021-10-14 DIAGNOSIS — Z951 Presence of aortocoronary bypass graft: Secondary | ICD-10-CM | POA: Diagnosis not present

## 2021-10-14 DIAGNOSIS — R0609 Other forms of dyspnea: Secondary | ICD-10-CM | POA: Diagnosis not present

## 2021-10-14 DIAGNOSIS — I442 Atrioventricular block, complete: Secondary | ICD-10-CM | POA: Diagnosis not present

## 2021-10-14 DIAGNOSIS — I4901 Ventricular fibrillation: Secondary | ICD-10-CM | POA: Diagnosis not present

## 2021-10-14 DIAGNOSIS — I951 Orthostatic hypotension: Secondary | ICD-10-CM | POA: Diagnosis not present

## 2021-10-15 ENCOUNTER — Other Ambulatory Visit (INDEPENDENT_AMBULATORY_CARE_PROVIDER_SITE_OTHER): Payer: Self-pay | Admitting: Nurse Practitioner

## 2021-10-15 DIAGNOSIS — I739 Peripheral vascular disease, unspecified: Secondary | ICD-10-CM

## 2021-10-19 ENCOUNTER — Ambulatory Visit (INDEPENDENT_AMBULATORY_CARE_PROVIDER_SITE_OTHER): Payer: BC Managed Care – PPO | Admitting: Vascular Surgery

## 2021-10-19 ENCOUNTER — Ambulatory Visit (INDEPENDENT_AMBULATORY_CARE_PROVIDER_SITE_OTHER): Payer: BC Managed Care – PPO

## 2021-10-19 ENCOUNTER — Other Ambulatory Visit (INDEPENDENT_AMBULATORY_CARE_PROVIDER_SITE_OTHER): Payer: Self-pay | Admitting: Nurse Practitioner

## 2021-10-19 ENCOUNTER — Encounter (INDEPENDENT_AMBULATORY_CARE_PROVIDER_SITE_OTHER): Payer: Self-pay | Admitting: Vascular Surgery

## 2021-10-19 VITALS — BP 148/74 | HR 78 | Resp 16 | Wt 187.4 lb

## 2021-10-19 DIAGNOSIS — M4316 Spondylolisthesis, lumbar region: Secondary | ICD-10-CM | POA: Diagnosis not present

## 2021-10-19 DIAGNOSIS — M7918 Myalgia, other site: Secondary | ICD-10-CM | POA: Diagnosis not present

## 2021-10-19 DIAGNOSIS — E785 Hyperlipidemia, unspecified: Secondary | ICD-10-CM | POA: Diagnosis not present

## 2021-10-19 DIAGNOSIS — Z950 Presence of cardiac pacemaker: Secondary | ICD-10-CM | POA: Diagnosis not present

## 2021-10-19 DIAGNOSIS — I10 Essential (primary) hypertension: Secondary | ICD-10-CM | POA: Diagnosis not present

## 2021-10-19 DIAGNOSIS — I739 Peripheral vascular disease, unspecified: Secondary | ICD-10-CM

## 2021-10-19 DIAGNOSIS — M4726 Other spondylosis with radiculopathy, lumbar region: Secondary | ICD-10-CM | POA: Diagnosis not present

## 2021-10-19 DIAGNOSIS — M48061 Spinal stenosis, lumbar region without neurogenic claudication: Secondary | ICD-10-CM | POA: Diagnosis not present

## 2021-10-19 DIAGNOSIS — Z9889 Other specified postprocedural states: Secondary | ICD-10-CM | POA: Diagnosis not present

## 2021-10-19 DIAGNOSIS — I442 Atrioventricular block, complete: Secondary | ICD-10-CM | POA: Diagnosis not present

## 2021-10-19 DIAGNOSIS — I70222 Atherosclerosis of native arteries of extremities with rest pain, left leg: Secondary | ICD-10-CM

## 2021-10-19 DIAGNOSIS — M545 Low back pain, unspecified: Secondary | ICD-10-CM | POA: Diagnosis not present

## 2021-10-19 NOTE — Assessment & Plan Note (Signed)
ABIs today are 1.19 on the right and 0.62 on the left.  This is a slight drop on the left.  At current, he does have some claudication symptoms but his back and other issues are limiting him much more.  No role for intervention at this time with no critical limb threatening symptoms.  I will plan a short interval follow-up of 3 months and continue the same medical regimen at this time.

## 2021-10-19 NOTE — H&P (View-Only) (Signed)
MRN : [id number]  Damon English is a 71 y.o. (Mar 03, 1951) male who presents with chief complaint of  Chief Complaint  Patient presents with   Follow-up    Ultrasound follow up  .  History of Present Illness: Patient returns today in follow up of his PAD.  He is doing reasonably well.  He has had some major back issues over the past several weeks and that is limiting him much more than anything else at this time.  He does have some claudication symptoms on the left but no rest pain or ulceration.  Right leg still has a fair bit of pain after his bypass surgery done at a different institution a couple of years ago, but his swelling is down after some weight loss.  He has had his cardiac issues fixed and now has a pacemaker. ABIs today are 1.19 on the right and 0.62 on the left.  This is a slight drop on the left.  Current Outpatient Medications  Medication Sig Dispense Refill   acetaminophen (TYLENOL) 325 MG tablet Take 2 tablets (650 mg total) by mouth every 6 (six) hours as needed for fever. 20 tablet 0   apixaban (ELIQUIS) 5 MG TABS tablet Take by mouth.     atorvastatin (LIPITOR) 80 MG tablet Take 80 mg by mouth daily.     clonazePAM (KLONOPIN) 0.25 MG disintegrating tablet Take 1-2 tablets (0.25-0.5 mg total) by mouth 3 (three) times daily as needed (anxiety/agitation). 20 tablet 0   clopidogrel (PLAVIX) 75 MG tablet Take 1 tablet (75 mg total) by mouth daily. 30 tablet 0   diclofenac Sodium (VOLTAREN) 1 % GEL Apply 4 g topically 4 (four) times daily.     famotidine (PEPCID) 40 MG tablet Take 1 tablet by mouth every morning.     furosemide (LASIX) 20 MG tablet Take 20 mg by mouth daily as needed (Edema (Or weight gain of >2lbs overnight or 5lbs in one week)).     ketoconazole (NIZORAL) 2 % shampoo Apply topically.     latanoprost (XALATAN) 0.005 % ophthalmic solution Place 1 drop into both eyes at bedtime. 2.5 mL 12   losartan (COZAAR) 25 MG tablet Take 25 mg by mouth daily.      metoprolol succinate (TOPROL-XL) 25 MG 24 hr tablet Take 1 tablet by mouth at bedtime.     nitroGLYCERIN (NITROSTAT) 0.4 MG SL tablet Place 1 tablet (0.4 mg total) under the tongue every 5 (five) minutes as needed for chest pain. 20 tablet 12   omeprazole (PRILOSEC) 40 MG capsule Take 40 mg by mouth daily.     polyethylene glycol (MIRALAX / GLYCOLAX) 17 g packet Take 17 g by mouth daily as needed for mild constipation or moderate constipation. 14 each 0   potassium chloride SA (KLOR-CON) 20 MEQ tablet Take by mouth. 6 tablets     ranolazine (RANEXA) 500 MG 12 hr tablet Take 500 mg by mouth 2 (two) times daily.     spironolactone (ALDACTONE) 25 MG tablet Take 12.5 mg by mouth.     thiamine 100 MG tablet Take 1 tablet (100 mg total) by mouth daily. 30 tablet 0   acidophilus (RISAQUAD) CAPS capsule Take 2 capsules by mouth 3 (three) times daily. (Patient not taking: Reported on 08/02/2021) 30 capsule 0   brimonidine (ALPHAGAN) 0.2 % ophthalmic solution Place 1 drop into both eyes 2 (two) times daily. (Patient not taking: Reported on 10/19/2021) 5 mL 12   Calcipotriene-Betameth Diprop (ENSTILAR) 0.005-0.064 %  FOAM APPLY THIN LAYER TO AFFECTED AREAS ONCE DAILY UNTIL FLAT AND ASYMPTOMATIC, MAX 4 WEEKS (Patient not taking: Reported on 08/02/2021)     Chlorhexidine Gluconate Cloth 2 % PADS Apply 6 each topically daily. (Patient not taking: Reported on 08/02/2021) 1 each 0   docusate sodium (COLACE) 100 MG capsule Take 1 capsule (100 mg total) by mouth 2 (two) times daily. (Patient not taking: Reported on 10/19/2021) 10 capsule 0   DULoxetine (CYMBALTA) 60 MG capsule Take 1 capsule (60 mg total) by mouth 2 (two) times daily. (Patient not taking: Reported on 06/18/2021) 20 capsule 0   esomeprazole (NEXIUM) 40 MG capsule Take 1 capsule by mouth daily. (Patient not taking: Reported on 06/18/2021)     ferrous sulfate 325 (65 FE) MG EC tablet Take 1 tablet by mouth daily with breakfast. (Patient not taking: Reported on  12/19/5972)     folic acid (FOLVITE) 1 MG tablet Take 1 tablet (1 mg total) by mouth daily. (Patient not taking: Reported on 08/02/2021) 30 tablet 0   glipiZIDE (GLUCOTROL) 10 MG tablet Take 5 mg by mouth daily before breakfast. (Patient not taking: Reported on 10/19/2021)     guaiFENesin (MUCINEX) 600 MG 12 hr tablet Take 1 tablet (600 mg total) by mouth 2 (two) times daily. (Patient not taking: Reported on 08/02/2021) 30 tablet 0   isosorbide mononitrate (IMDUR) 30 MG 24 hr tablet Take 1 tablet (30 mg total) by mouth daily. (Patient not taking: Reported on 08/02/2021) 30 tablet 0   Magnesium 250 MG TABS Take by mouth. (Patient not taking: Reported on 04/09/2021)     Multiple Vitamin (MULTIVITAMIN WITH MINERALS) TABS tablet Take 1 tablet by mouth daily. (Patient not taking: Reported on 10/19/2021) 30 tablet 0   ondansetron (ZOFRAN) 4 MG/2ML SOLN injection Inject 2 mLs (4 mg total) into the vein every 6 (six) hours as needed for nausea. (Patient not taking: Reported on 08/02/2021) 2 mL 0   phenol (CHLORASEPTIC) 1.4 % LIQD Use as directed 1 spray in the mouth or throat as needed for throat irritation / pain. (Patient not taking: Reported on 08/02/2021) 10 mL 0   SENNOSIDES PO Take by mouth. (Patient not taking: Reported on 06/18/2021)     sucralfate (CARAFATE) 1 g tablet Take 1 tablet by mouth 4 (four) times daily -  before meals and at bedtime. (Patient not taking: Reported on 07/16/2020)     No current facility-administered medications for this visit.    Past Medical History:  Diagnosis Date   Alcohol use    "cutting back" but still heavy and daily, multiple shots of bourbon each night   Diabetes mellitus without complication ([place])    History of tobacco use    reportedly quit in 2013   Hypertension    Pulmonary embolism ([place]) 2012   unprovoked   PVD (peripheral vascular disease) ([place])     Past Surgical History:  Procedure Laterality Date   BACK SURGERY     CHOLECYSTECTOMY     CORONARY  ANGIOGRAPHY N/A 01/30/2019   Procedure: CORONARY ANGIOGRAPHY;  Surgeon: Corey Skains, MD;  Location: [facility];  Service: Cardiovascular;  Laterality: N/A;   ERCP W/ SPHICTEROTOMY  2015   FEMORAL ARTERY - POPLITEAL ARTERY BYPASS GRAFT Right 06/19/2018   LEFT HEART CATH AND CORS/GRAFTS ANGIOGRAPHY N/A 06/25/2021   Procedure: LEFT HEART CATH AND CORS/GRAFTS ANGIOGRAPHY;  Surgeon: Andrez Grime, MD;  Location: [facility];  Service: Cardiovascular;  Laterality: N/A;  LOWER EXTREMITY ANGIOGRAPHY Left 01/14/2021   Procedure: LOWER EXTREMITY ANGIOGRAPHY;  Surgeon: Algernon Huxley, MD;  Location: [facility];  Service: Cardiovascular;  Laterality: Left;   TEMPORARY PACEMAKER N/A 07/11/2021   Procedure: TEMPORARY PACEMAKER;  Surgeon: Wellington Hampshire, MD;  Location: [facility];  Service: Cardiovascular;  Laterality: N/A;   THROMBOENDARTERECTOMY Right 06/19/2018   TONSILLECTOMY     VASCULAR SURGERY       Social History   Tobacco Use   Smoking status: Former    Packs/day: 2.00    Years: 40.00    Pack years: 80.00    Types: Cigarettes    Quit date: 11/27/2010    Years since quitting: 10.9   Smokeless tobacco: Never   Tobacco comments:    Quit in 2012  Substance Use Topics   Alcohol use: Yes    Alcohol/week: 7.0 standard drinks    Types: 7 Shots of liquor per week   Drug use: Never      Family History  Problem Relation Age of Onset   Hypertension Mother    Parkinson's disease Mother    Heart attack Father    Vascular Disease Father    AAA (abdominal aortic aneurysm) Father    Alzheimer's disease Father      Allergies  Allergen Reactions   Amoxicillin Other (See Comments)    Abdominal cramps Other reaction(s): Other (See Comments) Abdominal cramps Tolerated ancef 7/16   Hydromorphone Hcl     Quick & severe nausea/vomiting  Other reaction(s): Vomiting Quick & severe nausea/vomiting    Aspirin     Other reaction(s): Other (See  Comments) 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  Is on NOAC for now.  No ASA with it. Not allergic per patient.      REVIEW OF SYSTEMS (Negative unless checked)   Constitutional: [] Weight loss  [] Fever  [] Chills Cardiac: [] Chest pain   [] Chest pressure   [] Palpitations   [] Shortness of breath when laying flat   [] Shortness of breath at rest   [] Shortness of breath with exertion. Vascular:  [x] Pain in legs with walking   [] Pain in legs at rest   [x] Pain in legs when laying flat   [] Claudication   [] Pain in feet when walking  [] Pain in feet at rest  [] Pain in feet when laying flat   [] History of DVT   [] Phlebitis   [x] Swelling in legs   [] Varicose veins   [] Non-healing ulcers Pulmonary:   [] Uses home oxygen   [] Productive cough   [] Hemoptysis   [] Wheeze  [] COPD   [] Asthma Neurologic:  [] Dizziness  [] Blackouts   [] Seizures   [] History of stroke   [] History of TIA  [] Aphasia   [] Temporary blindness   [] Dysphagia   [] Weakness or numbness in arms   [] Weakness or numbness in legs Musculoskeletal:  [x] Arthritis   [] Joint swelling   [x] Joint pain   [] Low back pain Hematologic:  [] Easy bruising  [] Easy bleeding   [] Hypercoagulable state   [] Anemic  [] Hepatitis Gastrointestinal:  [] Blood in stool   [] Vomiting blood  [] Gastroesophageal reflux/heartburn   [] Abdominal pain Genitourinary:  [] Chronic kidney disease   [] Difficult urination  [] Frequent urination  [] Burning with urination   [] Hematuria Skin:  [] Rashes   [] Ulcers   [] Wounds Psychological:  [] History of anxiety   []  History of major depression.  Physical Examination  BP (!) 148/74 (BP Location: Right Arm)    Pulse 78    Resp 16    Wt 187 lb 6.4 oz (85  kg)    BMI 29.79 kg/m  Gen:  WD/WN, NAD Head: [place]/AT, No temporalis wasting. Ear/Nose/Throat: Hearing grossly intact, nares w/o erythema or drainage Eyes: Conjunctiva clear. Sclera non-icteric Neck: Supple.  Trachea midline Pulmonary:  Good air movement, no use of accessory muscles.  Cardiac: RRR, no  JVD Vascular:  Vessel Right Left  Radial Palpable Palpable                          PT 1+ palpable 1+ palpable  DP 2+ palpable Trace palpable   Gastrointestinal: soft, non-tender/non-distended. No guarding/reflex.  Musculoskeletal: M/S 5/5 throughout.  No deformity or atrophy.  Mild right lower extremity edema. Neurologic: Sensation grossly intact in extremities.  Symmetrical.  Speech is fluent.  Psychiatric: Judgment intact, Mood & affect appropriate for pt's clinical situation. Dermatologic: No rashes or ulcers noted.  No cellulitis or open wounds.      Labs Recent Results (from the past 2160 hour(s))  Glucose, capillary     Status: Abnormal   Collection Time: 08/26/21 10:55 AM  Result Value Ref Range   Glucose-Capillary 135 (H) 70 - 99 mg/dL    Comment: Glucose reference range applies only to samples taken after fasting for at least 8 hours.  Glucose, capillary     Status: None   Collection Time: 08/26/21 12:01 PM  Result Value Ref Range   Glucose-Capillary 82 70 - 99 mg/dL    Comment: Glucose reference range applies only to samples taken after fasting for at least 8 hours.  Glucose, capillary     Status: Abnormal   Collection Time: 08/31/21 11:03 AM  Result Value Ref Range   Glucose-Capillary 55 (L) 70 - 99 mg/dL    Comment: Glucose reference range applies only to samples taken after fasting for at least 8 hours.  Glucose, capillary     Status: None   Collection Time: 08/31/21 11:27 AM  Result Value Ref Range   Glucose-Capillary 94 70 - 99 mg/dL    Comment: Glucose reference range applies only to samples taken after fasting for at least 8 hours.  Glucose, capillary     Status: Abnormal   Collection Time: 09/02/21 10:21 AM  Result Value Ref Range   Glucose-Capillary 59 (L) 70 - 99 mg/dL    Comment: Glucose reference range applies only to samples taken after fasting for at least 8 hours.  Glucose, capillary     Status: Abnormal   Collection Time: 09/02/21 11:00 AM   Result Value Ref Range   Glucose-Capillary 105 (H) 70 - 99 mg/dL    Comment: Glucose reference range applies only to samples taken after fasting for at least 8 hours.  Glucose, capillary     Status: Abnormal   Collection Time: 09/02/21 11:23 AM  Result Value Ref Range   Glucose-Capillary 114 (H) 70 - 99 mg/dL    Comment: Glucose reference range applies only to samples taken after fasting for at least 8 hours.  Glucose, capillary     Status: Abnormal   Collection Time: 09/02/21 12:02 PM  Result Value Ref Range   Glucose-Capillary 108 (H) 70 - 99 mg/dL    Comment: Glucose reference range applies only to samples taken after fasting for at least 8 hours.  Glucose, capillary     Status: Abnormal   Collection Time: 09/07/21 11:11 AM  Result Value Ref Range   Glucose-Capillary 101 (H) 70 - 99 mg/dL    Comment: Glucose reference range applies only to  samples taken after fasting for at least 8 hours.  Glucose, capillary     Status: Abnormal   Collection Time: 09/07/21 11:29 AM  Result Value Ref Range   Glucose-Capillary 115 (H) 70 - 99 mg/dL    Comment: Glucose reference range applies only to samples taken after fasting for at least 8 hours.  Glucose, capillary     Status: Abnormal   Collection Time: 09/07/21 12:02 PM  Result Value Ref Range   Glucose-Capillary 120 (H) 70 - 99 mg/dL    Comment: Glucose reference range applies only to samples taken after fasting for at least 8 hours.  Glucose, capillary     Status: Abnormal   Collection Time: 09/14/21 11:12 AM  Result Value Ref Range   Glucose-Capillary 109 (H) 70 - 99 mg/dL    Comment: Glucose reference range applies only to samples taken after fasting for at least 8 hours.  Glucose, capillary     Status: Abnormal   Collection Time: 09/14/21 12:03 PM  Result Value Ref Range   Glucose-Capillary 61 (L) 70 - 99 mg/dL    Comment: Glucose reference range applies only to samples taken after fasting for at least 8 hours.  Glucose, capillary      Status: None   Collection Time: 09/14/21 12:22 PM  Result Value Ref Range   Glucose-Capillary 96 70 - 99 mg/dL    Comment: Glucose reference range applies only to samples taken after fasting for at least 8 hours.  Glucose, capillary     Status: None   Collection Time: 09/16/21 11:01 AM  Result Value Ref Range   Glucose-Capillary 94 70 - 99 mg/dL    Comment: Glucose reference range applies only to samples taken after fasting for at least 8 hours.  Glucose, capillary     Status: Abnormal   Collection Time: 09/16/21 12:03 PM  Result Value Ref Range   Glucose-Capillary 101 (H) 70 - 99 mg/dL    Comment: Glucose reference range applies only to samples taken after fasting for at least 8 hours.  Glucose, capillary     Status: Abnormal   Collection Time: 09/23/21 10:56 AM  Result Value Ref Range   Glucose-Capillary 119 (H) 70 - 99 mg/dL    Comment: Glucose reference range applies only to samples taken after fasting for at least 8 hours.  Glucose, capillary     Status: None   Collection Time: 09/23/21 12:04 PM  Result Value Ref Range   Glucose-Capillary 96 70 - 99 mg/dL    Comment: Glucose reference range applies only to samples taken after fasting for at least 8 hours.  Glucose, capillary     Status: None   Collection Time: 09/28/21 11:26 AM  Result Value Ref Range   Glucose-Capillary 94 70 - 99 mg/dL    Comment: Glucose reference range applies only to samples taken after fasting for at least 8 hours.    Radiology VAS [place] LE ART SEG MULTI ([facility])  Result Date: 10/19/2021  LOWER EXTREMITY DOPPLER STUDY Patient Name:  Damon English  Date of Exam:   10/19/2021 Medical Rec #: [id number]         Accession #:    [id number] Date of Birth: [birth year]          Patient Gender: M Patient Age:   77 years Exam Location:  [facility] Procedure:      VAS [place] LOWER EXT ART SEG MULTI (SEGMENTALS \\T \ LE RAYNAUDS) Referring Phys: Eulogio Ditch  --------------------------------------------------------------------------------  Indications: Peripheral artery disease.  Performing Technologist: Blondell Reveal RT, RDMS, RVT  Examination Guidelines: A complete evaluation includes at minimum, Doppler waveform signals and systolic blood pressure reading at the level of bilateral brachial, anterior tibial, and posterior tibial arteries, when vessel segments are accessible. Bilateral testing is considered an integral part of a complete examination. Photoelectric Plethysmograph (PPG) waveforms and toe systolic pressure readings are included as required and additional duplex testing as needed. Limited examinations for reoccurring indications may be performed as noted.  ABI Findings: +---------+------------------+-----+--------+--------+  Right     Rt Pressure (mmHg) Index Waveform Comment   +---------+------------------+-----+--------+--------+  Brachial  134                                         +---------+------------------+-----+--------+--------+  CFA                                biphasic           +---------+------------------+-----+--------+--------+  Popliteal                          biphasic           +---------+------------------+-----+--------+--------+  ATA       149                1.11  biphasic           +---------+------------------+-----+--------+--------+  PTA       159                1.19  biphasic           +---------+------------------+-----+--------+--------+  Great Toe 91                 0.68  Normal             +---------+------------------+-----+--------+--------+ +---------+------------------+-----+----------+-------+  Left      Lt Pressure (mmHg) Index Waveform   Comment  +---------+------------------+-----+----------+-------+  Brachial  132                                          +---------+------------------+-----+----------+-------+  CFA                                biphasic             +---------+------------------+-----+----------+-------+  Popliteal                          monophasic          +---------+------------------+-----+----------+-------+  ATA       76                 0.57  monophasic          +---------+------------------+-----+----------+-------+  PTA       83                 0.62  monophasic          +---------+------------------+-----+----------+-------+  Great Toe 15                 0.11  Abnormal            +---------+------------------+-----+----------+-------+ +-------+-----------+-----------+------------+------------+  ABI/TBI Today's ABI Today's TBI Previous ABI Previous TBI  +-------+-----------+-----------+------------+------------+  Right   1.19        0.68        1.28         0.79          +-------+-----------+-----------+------------+------------+  Left    0.62        0.11        0.79         0.38          +-------+-----------+-----------+------------+------------+ Left ABIs appear mildly decreased compared to prior study on 06/18/21. Right ABIs appear essentially unchanged.  Summary: Right: Resting right ankle-brachial index is within normal range. No evidence of significant right lower extremity arterial disease. The right toe-brachial index is mildly abnormal. Left: Resting left ankle-brachial index indicates moderate left lower extremity arterial disease at the SFA level. The left toe-brachial index is abnormal.  *See table(s) above for measurements and observations.  Electronically signed by [name] MD on 10/19/2021 at 2:59:27 PM.    Final     Assessment/Plan HTN (hypertension) blood pressure control important in reducing the progression of atherosclerotic disease. On appropriate oral medications.     Non-insulin dependent type 2 diabetes mellitus (HCC) blood glucose control important in reducing the progression of atherosclerotic disease. Also, involved in wound healing. On appropriate medications.     Hyperlipidemia lipid control important in reducing  the progression of atherosclerotic disease. Continue statin therapy  Atherosclerosis of native arteries of extremity with rest pain (HCC) ABIs today are 1.19 on the right and 0.62 on the left.  This is a slight drop on the left.  At current, he does have some claudication symptoms but his back and other issues are limiting him much more.  No role for intervention at this time with no critical limb threatening symptoms.  I will plan a short interval follow-up of 3 months and continue the same medical regimen at this time.    [name], MD  10/19/2021 3:36 PM    This note was created with Dragon medical transcription system.  Any errors from dictation are purely unintentional

## 2021-10-19 NOTE — Progress Notes (Signed)
MRN : [id number]  Damon English is a 71 y.o. (10-21-1950) male who presents with chief complaint of  Chief Complaint  Patient presents with   Follow-up    Ultrasound follow up  .  History of Present Illness: Patient returns today in follow up of his PAD.  He is doing reasonably well.  He has had some major back issues over the past several weeks and that is limiting him much more than anything else at this time.  He does have some claudication symptoms on the left but no rest pain or ulceration.  Right leg still has a fair bit of pain after his bypass surgery done at a different institution a couple of years ago, but his swelling is down after some weight loss.  He has had his cardiac issues fixed and now has a pacemaker. ABIs today are 1.19 on the right and 0.62 on the left.  This is a slight drop on the left.  Current Outpatient Medications  Medication Sig Dispense Refill   acetaminophen (TYLENOL) 325 MG tablet Take 2 tablets (650 mg total) by mouth every 6 (six) hours as needed for fever. 20 tablet 0   apixaban (ELIQUIS) 5 MG TABS tablet Take by mouth.     atorvastatin (LIPITOR) 80 MG tablet Take 80 mg by mouth daily.     clonazePAM (KLONOPIN) 0.25 MG disintegrating tablet Take 1-2 tablets (0.25-0.5 mg total) by mouth 3 (three) times daily as needed (anxiety/agitation). 20 tablet 0   clopidogrel (PLAVIX) 75 MG tablet Take 1 tablet (75 mg total) by mouth daily. 30 tablet 0   diclofenac Sodium (VOLTAREN) 1 % GEL Apply 4 g topically 4 (four) times daily.     famotidine (PEPCID) 40 MG tablet Take 1 tablet by mouth every morning.     furosemide (LASIX) 20 MG tablet Take 20 mg by mouth daily as needed (Edema (Or weight gain of >2lbs overnight or 5lbs in one week)).     ketoconazole (NIZORAL) 2 % shampoo Apply topically.     latanoprost (XALATAN) 0.005 % ophthalmic solution Place 1 drop into both eyes at bedtime. 2.5 mL 12   losartan (COZAAR) 25 MG tablet Take 25 mg by mouth daily.      metoprolol succinate (TOPROL-XL) 25 MG 24 hr tablet Take 1 tablet by mouth at bedtime.     nitroGLYCERIN (NITROSTAT) 0.4 MG SL tablet Place 1 tablet (0.4 mg total) under the tongue every 5 (five) minutes as needed for chest pain. 20 tablet 12   omeprazole (PRILOSEC) 40 MG capsule Take 40 mg by mouth daily.     polyethylene glycol (MIRALAX / GLYCOLAX) 17 g packet Take 17 g by mouth daily as needed for mild constipation or moderate constipation. 14 each 0   potassium chloride SA (KLOR-CON) 20 MEQ tablet Take by mouth. 6 tablets     ranolazine (RANEXA) 500 MG 12 hr tablet Take 500 mg by mouth 2 (two) times daily.     spironolactone (ALDACTONE) 25 MG tablet Take 12.5 mg by mouth.     thiamine 100 MG tablet Take 1 tablet (100 mg total) by mouth daily. 30 tablet 0   acidophilus (RISAQUAD) CAPS capsule Take 2 capsules by mouth 3 (three) times daily. (Patient not taking: Reported on 08/02/2021) 30 capsule 0   brimonidine (ALPHAGAN) 0.2 % ophthalmic solution Place 1 drop into both eyes 2 (two) times daily. (Patient not taking: Reported on 10/19/2021) 5 mL 12   Calcipotriene-Betameth Diprop (ENSTILAR) 0.005-0.064 %  FOAM APPLY THIN LAYER TO AFFECTED AREAS ONCE DAILY UNTIL FLAT AND ASYMPTOMATIC, MAX 4 WEEKS (Patient not taking: Reported on 08/02/2021)     Chlorhexidine Gluconate Cloth 2 % PADS Apply 6 each topically daily. (Patient not taking: Reported on 08/02/2021) 1 each 0   docusate sodium (COLACE) 100 MG capsule Take 1 capsule (100 mg total) by mouth 2 (two) times daily. (Patient not taking: Reported on 10/19/2021) 10 capsule 0   DULoxetine (CYMBALTA) 60 MG capsule Take 1 capsule (60 mg total) by mouth 2 (two) times daily. (Patient not taking: Reported on 06/18/2021) 20 capsule 0   esomeprazole (NEXIUM) 40 MG capsule Take 1 capsule by mouth daily. (Patient not taking: Reported on 06/18/2021)     ferrous sulfate 325 (65 FE) MG EC tablet Take 1 tablet by mouth daily with breakfast. (Patient not taking: Reported on  04/23/5884)     folic acid (FOLVITE) 1 MG tablet Take 1 tablet (1 mg total) by mouth daily. (Patient not taking: Reported on 08/02/2021) 30 tablet 0   glipiZIDE (GLUCOTROL) 10 MG tablet Take 5 mg by mouth daily before breakfast. (Patient not taking: Reported on 10/19/2021)     guaiFENesin (MUCINEX) 600 MG 12 hr tablet Take 1 tablet (600 mg total) by mouth 2 (two) times daily. (Patient not taking: Reported on 08/02/2021) 30 tablet 0   isosorbide mononitrate (IMDUR) 30 MG 24 hr tablet Take 1 tablet (30 mg total) by mouth daily. (Patient not taking: Reported on 08/02/2021) 30 tablet 0   Magnesium 250 MG TABS Take by mouth. (Patient not taking: Reported on 04/09/2021)     Multiple Vitamin (MULTIVITAMIN WITH MINERALS) TABS tablet Take 1 tablet by mouth daily. (Patient not taking: Reported on 10/19/2021) 30 tablet 0   ondansetron (ZOFRAN) 4 MG/2ML SOLN injection Inject 2 mLs (4 mg total) into the vein every 6 (six) hours as needed for nausea. (Patient not taking: Reported on 08/02/2021) 2 mL 0   phenol (CHLORASEPTIC) 1.4 % LIQD Use as directed 1 spray in the mouth or throat as needed for throat irritation / pain. (Patient not taking: Reported on 08/02/2021) 10 mL 0   SENNOSIDES PO Take by mouth. (Patient not taking: Reported on 06/18/2021)     sucralfate (CARAFATE) 1 g tablet Take 1 tablet by mouth 4 (four) times daily -  before meals and at bedtime. (Patient not taking: Reported on 07/16/2020)     No current facility-administered medications for this visit.    Past Medical History:  Diagnosis Date   Alcohol use    "cutting back" but still heavy and daily, multiple shots of bourbon each night   Diabetes mellitus without complication ([place])    History of tobacco use    reportedly quit in 2013   Hypertension    Pulmonary embolism ([place]) 2012   unprovoked   PVD (peripheral vascular disease) ([place])     Past Surgical History:  Procedure Laterality Date   BACK SURGERY     CHOLECYSTECTOMY     CORONARY  ANGIOGRAPHY N/A 01/30/2019   Procedure: CORONARY ANGIOGRAPHY;  Surgeon: Corey Skains, MD;  Location: [facility];  Service: Cardiovascular;  Laterality: N/A;   ERCP W/ SPHICTEROTOMY  2015   FEMORAL ARTERY - POPLITEAL ARTERY BYPASS GRAFT Right 06/19/2018   LEFT HEART CATH AND CORS/GRAFTS ANGIOGRAPHY N/A 06/25/2021   Procedure: LEFT HEART CATH AND CORS/GRAFTS ANGIOGRAPHY;  Surgeon: Andrez Grime, MD;  Location: [facility];  Service: Cardiovascular;  Laterality: N/A;  LOWER EXTREMITY ANGIOGRAPHY Left 01/14/2021   Procedure: LOWER EXTREMITY ANGIOGRAPHY;  Surgeon: Algernon Huxley, MD;  Location: [facility];  Service: Cardiovascular;  Laterality: Left;   TEMPORARY PACEMAKER N/A 07/11/2021   Procedure: TEMPORARY PACEMAKER;  Surgeon: Wellington Hampshire, MD;  Location: [facility];  Service: Cardiovascular;  Laterality: N/A;   THROMBOENDARTERECTOMY Right 06/19/2018   TONSILLECTOMY     VASCULAR SURGERY       Social History   Tobacco Use   Smoking status: Former    Packs/day: 2.00    Years: 40.00    Pack years: 80.00    Types: Cigarettes    Quit date: 11/27/2010    Years since quitting: 10.9   Smokeless tobacco: Never   Tobacco comments:    Quit in 2012  Substance Use Topics   Alcohol use: Yes    Alcohol/week: 7.0 standard drinks    Types: 7 Shots of liquor per week   Drug use: Never      Family History  Problem Relation Age of Onset   Hypertension Mother    Parkinson's disease Mother    Heart attack Father    Vascular Disease Father    AAA (abdominal aortic aneurysm) Father    Alzheimer's disease Father      Allergies  Allergen Reactions   Amoxicillin Other (See Comments)    Abdominal cramps Other reaction(s): Other (See Comments) Abdominal cramps Tolerated ancef 7/16   Hydromorphone Hcl     Quick & severe nausea/vomiting  Other reaction(s): Vomiting Quick & severe nausea/vomiting    Aspirin     Other reaction(s): Other (See  Comments) 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  Is on NOAC for now.  No ASA with it. Not allergic per patient.      REVIEW OF SYSTEMS (Negative unless checked)   Constitutional: [] Weight loss  [] Fever  [] Chills Cardiac: [] Chest pain   [] Chest pressure   [] Palpitations   [] Shortness of breath when laying flat   [] Shortness of breath at rest   [] Shortness of breath with exertion. Vascular:  [x] Pain in legs with walking   [] Pain in legs at rest   [x] Pain in legs when laying flat   [] Claudication   [] Pain in feet when walking  [] Pain in feet at rest  [] Pain in feet when laying flat   [] History of DVT   [] Phlebitis   [x] Swelling in legs   [] Varicose veins   [] Non-healing ulcers Pulmonary:   [] Uses home oxygen   [] Productive cough   [] Hemoptysis   [] Wheeze  [] COPD   [] Asthma Neurologic:  [] Dizziness  [] Blackouts   [] Seizures   [] History of stroke   [] History of TIA  [] Aphasia   [] Temporary blindness   [] Dysphagia   [] Weakness or numbness in arms   [] Weakness or numbness in legs Musculoskeletal:  [x] Arthritis   [] Joint swelling   [x] Joint pain   [] Low back pain Hematologic:  [] Easy bruising  [] Easy bleeding   [] Hypercoagulable state   [] Anemic  [] Hepatitis Gastrointestinal:  [] Blood in stool   [] Vomiting blood  [] Gastroesophageal reflux/heartburn   [] Abdominal pain Genitourinary:  [] Chronic kidney disease   [] Difficult urination  [] Frequent urination  [] Burning with urination   [] Hematuria Skin:  [] Rashes   [] Ulcers   [] Wounds Psychological:  [] History of anxiety   []  History of major depression.  Physical Examination  BP (!) 148/74 (BP Location: Right Arm)    Pulse 78    Resp 16    Wt 187 lb 6.4 oz (85  kg)    BMI 29.79 kg/m  Gen:  WD/WN, NAD Head: [place]/AT, No temporalis wasting. Ear/Nose/Throat: Hearing grossly intact, nares w/o erythema or drainage Eyes: Conjunctiva clear. Sclera non-icteric Neck: Supple.  Trachea midline Pulmonary:  Good air movement, no use of accessory muscles.  Cardiac: RRR, no  JVD Vascular:  Vessel Right Left  Radial Palpable Palpable                          PT 1+ palpable 1+ palpable  DP 2+ palpable Trace palpable   Gastrointestinal: soft, non-tender/non-distended. No guarding/reflex.  Musculoskeletal: M/S 5/5 throughout.  No deformity or atrophy.  Mild right lower extremity edema. Neurologic: Sensation grossly intact in extremities.  Symmetrical.  Speech is fluent.  Psychiatric: Judgment intact, Mood & affect appropriate for pt's clinical situation. Dermatologic: No rashes or ulcers noted.  No cellulitis or open wounds.      Labs Recent Results (from the past 2160 hour(s))  Glucose, capillary     Status: Abnormal   Collection Time: 08/26/21 10:55 AM  Result Value Ref Range   Glucose-Capillary 135 (H) 70 - 99 mg/dL    Comment: Glucose reference range applies only to samples taken after fasting for at least 8 hours.  Glucose, capillary     Status: None   Collection Time: 08/26/21 12:01 PM  Result Value Ref Range   Glucose-Capillary 82 70 - 99 mg/dL    Comment: Glucose reference range applies only to samples taken after fasting for at least 8 hours.  Glucose, capillary     Status: Abnormal   Collection Time: 08/31/21 11:03 AM  Result Value Ref Range   Glucose-Capillary 55 (L) 70 - 99 mg/dL    Comment: Glucose reference range applies only to samples taken after fasting for at least 8 hours.  Glucose, capillary     Status: None   Collection Time: 08/31/21 11:27 AM  Result Value Ref Range   Glucose-Capillary 94 70 - 99 mg/dL    Comment: Glucose reference range applies only to samples taken after fasting for at least 8 hours.  Glucose, capillary     Status: Abnormal   Collection Time: 09/02/21 10:21 AM  Result Value Ref Range   Glucose-Capillary 59 (L) 70 - 99 mg/dL    Comment: Glucose reference range applies only to samples taken after fasting for at least 8 hours.  Glucose, capillary     Status: Abnormal   Collection Time: 09/02/21 11:00 AM   Result Value Ref Range   Glucose-Capillary 105 (H) 70 - 99 mg/dL    Comment: Glucose reference range applies only to samples taken after fasting for at least 8 hours.  Glucose, capillary     Status: Abnormal   Collection Time: 09/02/21 11:23 AM  Result Value Ref Range   Glucose-Capillary 114 (H) 70 - 99 mg/dL    Comment: Glucose reference range applies only to samples taken after fasting for at least 8 hours.  Glucose, capillary     Status: Abnormal   Collection Time: 09/02/21 12:02 PM  Result Value Ref Range   Glucose-Capillary 108 (H) 70 - 99 mg/dL    Comment: Glucose reference range applies only to samples taken after fasting for at least 8 hours.  Glucose, capillary     Status: Abnormal   Collection Time: 09/07/21 11:11 AM  Result Value Ref Range   Glucose-Capillary 101 (H) 70 - 99 mg/dL    Comment: Glucose reference range applies only to  samples taken after fasting for at least 8 hours.  Glucose, capillary     Status: Abnormal   Collection Time: 09/07/21 11:29 AM  Result Value Ref Range   Glucose-Capillary 115 (H) 70 - 99 mg/dL    Comment: Glucose reference range applies only to samples taken after fasting for at least 8 hours.  Glucose, capillary     Status: Abnormal   Collection Time: 09/07/21 12:02 PM  Result Value Ref Range   Glucose-Capillary 120 (H) 70 - 99 mg/dL    Comment: Glucose reference range applies only to samples taken after fasting for at least 8 hours.  Glucose, capillary     Status: Abnormal   Collection Time: 09/14/21 11:12 AM  Result Value Ref Range   Glucose-Capillary 109 (H) 70 - 99 mg/dL    Comment: Glucose reference range applies only to samples taken after fasting for at least 8 hours.  Glucose, capillary     Status: Abnormal   Collection Time: 09/14/21 12:03 PM  Result Value Ref Range   Glucose-Capillary 61 (L) 70 - 99 mg/dL    Comment: Glucose reference range applies only to samples taken after fasting for at least 8 hours.  Glucose, capillary      Status: None   Collection Time: 09/14/21 12:22 PM  Result Value Ref Range   Glucose-Capillary 96 70 - 99 mg/dL    Comment: Glucose reference range applies only to samples taken after fasting for at least 8 hours.  Glucose, capillary     Status: None   Collection Time: 09/16/21 11:01 AM  Result Value Ref Range   Glucose-Capillary 94 70 - 99 mg/dL    Comment: Glucose reference range applies only to samples taken after fasting for at least 8 hours.  Glucose, capillary     Status: Abnormal   Collection Time: 09/16/21 12:03 PM  Result Value Ref Range   Glucose-Capillary 101 (H) 70 - 99 mg/dL    Comment: Glucose reference range applies only to samples taken after fasting for at least 8 hours.  Glucose, capillary     Status: Abnormal   Collection Time: 09/23/21 10:56 AM  Result Value Ref Range   Glucose-Capillary 119 (H) 70 - 99 mg/dL    Comment: Glucose reference range applies only to samples taken after fasting for at least 8 hours.  Glucose, capillary     Status: None   Collection Time: 09/23/21 12:04 PM  Result Value Ref Range   Glucose-Capillary 96 70 - 99 mg/dL    Comment: Glucose reference range applies only to samples taken after fasting for at least 8 hours.  Glucose, capillary     Status: None   Collection Time: 09/28/21 11:26 AM  Result Value Ref Range   Glucose-Capillary 94 70 - 99 mg/dL    Comment: Glucose reference range applies only to samples taken after fasting for at least 8 hours.    Radiology VAS [place] LE ART SEG MULTI ([facility])  Result Date: 10/19/2021  LOWER EXTREMITY DOPPLER STUDY Patient Name:  Damon English  Date of Exam:   10/19/2021 Medical Rec #: [id number]         Accession #:    [id number] Date of Birth: April 22, 1951          Patient Gender: M Patient Age:   5 years Exam Location:  [facility] Procedure:      VAS [place] LOWER EXT ART SEG MULTI (SEGMENTALS \\T \ LE RAYNAUDS) Referring Phys: Eulogio Ditch  --------------------------------------------------------------------------------  Indications: Peripheral artery disease.  Performing Technologist: Blondell Reveal RT, RDMS, RVT  Examination Guidelines: A complete evaluation includes at minimum, Doppler waveform signals and systolic blood pressure reading at the level of bilateral brachial, anterior tibial, and posterior tibial arteries, when vessel segments are accessible. Bilateral testing is considered an integral part of a complete examination. Photoelectric Plethysmograph (PPG) waveforms and toe systolic pressure readings are included as required and additional duplex testing as needed. Limited examinations for reoccurring indications may be performed as noted.  ABI Findings: +---------+------------------+-----+--------+--------+  Right     Rt Pressure (mmHg) Index Waveform Comment   +---------+------------------+-----+--------+--------+  Brachial  134                                         +---------+------------------+-----+--------+--------+  CFA                                biphasic           +---------+------------------+-----+--------+--------+  Popliteal                          biphasic           +---------+------------------+-----+--------+--------+  ATA       149                1.11  biphasic           +---------+------------------+-----+--------+--------+  PTA       159                1.19  biphasic           +---------+------------------+-----+--------+--------+  Great Toe 91                 0.68  Normal             +---------+------------------+-----+--------+--------+ +---------+------------------+-----+----------+-------+  Left      Lt Pressure (mmHg) Index Waveform   Comment  +---------+------------------+-----+----------+-------+  Brachial  132                                          +---------+------------------+-----+----------+-------+  CFA                                biphasic             +---------+------------------+-----+----------+-------+  Popliteal                          monophasic          +---------+------------------+-----+----------+-------+  ATA       76                 0.57  monophasic          +---------+------------------+-----+----------+-------+  PTA       83                 0.62  monophasic          +---------+------------------+-----+----------+-------+  Great Toe 15                 0.11  Abnormal            +---------+------------------+-----+----------+-------+ +-------+-----------+-----------+------------+------------+  ABI/TBI Today's ABI Today's TBI Previous ABI Previous TBI  +-------+-----------+-----------+------------+------------+  Right   1.19        0.68        1.28         0.79          +-------+-----------+-----------+------------+------------+  Left    0.62        0.11        0.79         0.38          +-------+-----------+-----------+------------+------------+ Left ABIs appear mildly decreased compared to prior study on 06/18/21. Right ABIs appear essentially unchanged.  Summary: Right: Resting right ankle-brachial index is within normal range. No evidence of significant right lower extremity arterial disease. The right toe-brachial index is mildly abnormal. Left: Resting left ankle-brachial index indicates moderate left lower extremity arterial disease at the SFA level. The left toe-brachial index is abnormal.  *See table(s) above for measurements and observations.  Electronically signed by [name] MD on 10/19/2021 at 2:59:27 PM.    Final     Assessment/Plan HTN (hypertension) blood pressure control important in reducing the progression of atherosclerotic disease. On appropriate oral medications.     Non-insulin dependent type 2 diabetes mellitus (HCC) blood glucose control important in reducing the progression of atherosclerotic disease. Also, involved in wound healing. On appropriate medications.     Hyperlipidemia lipid control important in reducing  the progression of atherosclerotic disease. Continue statin therapy  Atherosclerosis of native arteries of extremity with rest pain (HCC) ABIs today are 1.19 on the right and 0.62 on the left.  This is a slight drop on the left.  At current, he does have some claudication symptoms but his back and other issues are limiting him much more.  No role for intervention at this time with no critical limb threatening symptoms.  I will plan a short interval follow-up of 3 months and continue the same medical regimen at this time.    [name], MD  10/19/2021 3:36 PM    This note was created with Dragon medical transcription system.  Any errors from dictation are purely unintentional

## 2021-10-20 ENCOUNTER — Telehealth: Payer: Self-pay

## 2021-10-20 DIAGNOSIS — Z9861 Coronary angioplasty status: Secondary | ICD-10-CM

## 2021-10-20 DIAGNOSIS — I214 Non-ST elevation (NSTEMI) myocardial infarction: Secondary | ICD-10-CM

## 2021-10-20 NOTE — Telephone Encounter (Signed)
Patient called [facility] to let us know that his doctor wants him to stop rehab and start PT for his back. Staff will place patient on medical hold until we hear back on how long he may need PT for. If PT lasts a longer time, may possibly need to discharge and get new referral  when appropriate and cleared. Patient will call and keep [place] updated when he starts PT.

## 2021-10-26 ENCOUNTER — Telehealth (INDEPENDENT_AMBULATORY_CARE_PROVIDER_SITE_OTHER): Payer: Self-pay

## 2021-10-26 NOTE — Telephone Encounter (Signed)
What sort of sharp pain? Was it shooting from his hip to legs, did it come and go? Does he still have it? Is there any other pain or issues in his leg? Which leg was it?

## 2021-10-26 NOTE — Telephone Encounter (Signed)
Let's bring him in to see me Thursday or dew Friday. No studies

## 2021-10-26 NOTE — Telephone Encounter (Signed)
Patient states that its the top of his toes all 4 of them that was having sharp pains and now not as bad as last night. Patient stated it hurt to stand on and felt like his foot was asleep. Patient states no other pain or issues.

## 2021-10-26 NOTE — Telephone Encounter (Signed)
Patient called in stating that he woke up to some sharp pain in his leg. Stated that it didn't start until after he came in and seen Dr. Lucky Cowboy. He wasn't for sure if this was a vascular issue or if he needed to go see his pcp. He said he thinks if he goes and see his pcp that he will just refer him to come back in office to see vascular doctor. Wanting to speak with someone about what he should do    Please call and advise

## 2021-10-28 ENCOUNTER — Encounter (INDEPENDENT_AMBULATORY_CARE_PROVIDER_SITE_OTHER): Payer: Self-pay | Admitting: Nurse Practitioner

## 2021-10-28 ENCOUNTER — Ambulatory Visit: Payer: BC Managed Care – PPO | Attending: Physical Medicine and Rehabilitation | Admitting: Physical Therapy

## 2021-10-28 ENCOUNTER — Telehealth: Payer: Self-pay | Admitting: Physical Therapy

## 2021-10-28 ENCOUNTER — Ambulatory Visit (INDEPENDENT_AMBULATORY_CARE_PROVIDER_SITE_OTHER): Payer: BC Managed Care – PPO | Admitting: Nurse Practitioner

## 2021-10-28 ENCOUNTER — Other Ambulatory Visit: Payer: Self-pay

## 2021-10-28 VITALS — BP 117/61 | HR 96

## 2021-10-28 VITALS — BP 148/78 | HR 94 | Resp 16 | Wt 187.0 lb

## 2021-10-28 DIAGNOSIS — I70222 Atherosclerosis of native arteries of extremities with rest pain, left leg: Secondary | ICD-10-CM

## 2021-10-28 DIAGNOSIS — M545 Low back pain, unspecified: Secondary | ICD-10-CM | POA: Diagnosis not present

## 2021-10-28 DIAGNOSIS — R262 Difficulty in walking, not elsewhere classified: Secondary | ICD-10-CM | POA: Diagnosis not present

## 2021-10-28 DIAGNOSIS — E785 Hyperlipidemia, unspecified: Secondary | ICD-10-CM | POA: Diagnosis not present

## 2021-10-28 DIAGNOSIS — I10 Essential (primary) hypertension: Secondary | ICD-10-CM | POA: Diagnosis not present

## 2021-10-28 DIAGNOSIS — L819 Disorder of pigmentation, unspecified: Secondary | ICD-10-CM
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
## 2021-10-28 NOTE — Telephone Encounter (Signed)
Called pt to see if he wanted to come in earlier on account of increased availability in schedule.

## 2021-10-28 NOTE — Progress Notes (Signed)
Subjective:    Patient ID: Damon English, male    DOB: 10-09-1951, 71 y.o.   MRN: [id number] Chief Complaint  Patient presents with   Follow-up    Left foot pain (toes)    Damon English is a 71 year old male that presents today after a recent follow-up visit on 10/19/2021 for his ABIs.  Shortly after his visit he began to have sharp shooting pain in his toes.  This pain has been happening more most consistently at night when he is elevating his legs.  He notes that as he walks around throughout the day but these pains subside and they occur sometimes when he has been sitting at his desk for extended periods.  They only occur in his left lower extremity.  Studies on 10/19/2021 showed a right ABI of 1.19 with a TBI 0.68.  He had biphasic waveforms with good toe waveforms.  Left lower extremity had an ABI 0.62 with a TBI 0.11.  The patient had monophasic waveforms from the popliteal artery down to the tibials.  The patient had severely dampened toe waveforms.  He currently denies any open wounds or ulcerations.  Also concerning to the patient he notes that he has recently begun having discoloration in both of his hands.  He notes that this discoloration improves when he warms up his hands.  It is not constant but when it occurs it causes issues with his motor function and they do become slightly painful.   Review of Systems  Skin:  Positive for color change.  All other systems reviewed and are negative.     Objective:   Physical Exam Vitals reviewed.  HENT:     Head: Normocephalic.  Cardiovascular:     Rate and Rhythm: Normal rate.     Pulses:          Radial pulses are 0 on the right side and 0 on the left side.       Dorsalis pedis pulses are detected w/ Doppler on the left side.       Posterior tibial pulses are detected w/ Doppler on the left side.  Pulmonary:     Effort: Pulmonary effort is normal.  Skin:    General: Skin is warm and dry.     Capillary Refill: Capillary refill  takes 2 to 3 seconds.  Neurological:     Mental Status: He is alert and oriented to person, place, and time.  Psychiatric:        Mood and Affect: Mood normal.        Behavior: Behavior normal.        Thought Content: Thought content normal.        Judgment: Judgment normal.    BP (!) 148/78 (BP Location: Right Arm)    Pulse 94    Resp 16    Wt 187 lb (84.8 kg)    BMI 29.73 kg/m   Past Medical History:  Diagnosis Date   Alcohol use    "cutting back" but still heavy and daily, multiple shots of bourbon each night   Diabetes mellitus without complication ([place])    History of tobacco use    reportedly quit in 2013   Hypertension    Pulmonary embolism ([place]) 2012   unprovoked   PVD (peripheral vascular disease) ([place])     Social History   Socioeconomic History   Marital status: Married    Spouse name: Not on file   Number of children: Not on file  Years of education: Not on file   Highest education level: Not on file  Occupational History   Not on file  Tobacco Use   Smoking status: Former    Packs/day: 2.00    Years: 40.00    Pack years: 80.00    Types: Cigarettes    Quit date: 11/27/2010    Years since quitting: 10.9   Smokeless tobacco: Never   Tobacco comments:    Quit in 2012  Substance and Sexual Activity   Alcohol use: Yes    Alcohol/week: 7.0 standard drinks    Types: 7 Shots of liquor per week   Drug use: Never   Sexual activity: Not on file  Other Topics Concern   Not on file  Social History Narrative   Not on file   Social Determinants of Health   Financial Resource Strain: Not on file  Food Insecurity: Not on file  Transportation Needs: Not on file  Physical Activity: Not on file  Stress: Not on file  Social Connections: Not on file  Intimate Partner Violence: Not on file    Past Surgical History:  Procedure Laterality Date   BACK SURGERY     CHOLECYSTECTOMY     CORONARY ANGIOGRAPHY N/A 01/30/2019   Procedure: CORONARY ANGIOGRAPHY;   Surgeon: Corey Skains, MD;  Location: [facility];  Service: Cardiovascular;  Laterality: N/A;   ERCP W/ SPHICTEROTOMY  2015   FEMORAL ARTERY - POPLITEAL ARTERY BYPASS GRAFT Right 06/19/2018   LEFT HEART CATH AND CORS/GRAFTS ANGIOGRAPHY N/A 06/25/2021   Procedure: LEFT HEART CATH AND CORS/GRAFTS ANGIOGRAPHY;  Surgeon: Andrez Grime, MD;  Location: [facility];  Service: Cardiovascular;  Laterality: N/A;   LOWER EXTREMITY ANGIOGRAPHY Left 01/14/2021   Procedure: LOWER EXTREMITY ANGIOGRAPHY;  Surgeon: Algernon Huxley, MD;  Location: [facility];  Service: Cardiovascular;  Laterality: Left;   TEMPORARY PACEMAKER N/A 07/11/2021   Procedure: TEMPORARY PACEMAKER;  Surgeon: Wellington Hampshire, MD;  Location: [facility];  Service: Cardiovascular;  Laterality: N/A;   THROMBOENDARTERECTOMY Right 06/19/2018   TONSILLECTOMY     VASCULAR SURGERY      Family History  Problem Relation Age of Onset   Hypertension Mother    Parkinson's disease Mother    Heart attack Father    Vascular Disease Father    AAA (abdominal aortic aneurysm) Father    Alzheimer's disease Father     Allergies  Allergen Reactions   Amoxicillin Other (See Comments)    Abdominal cramps Other reaction(s): Other (See Comments) Abdominal cramps Tolerated ancef 7/16   Hydromorphone Hcl     Quick & severe nausea/vomiting  Other reaction(s): Vomiting Quick & severe nausea/vomiting    Aspirin     Other reaction(s): Other (See Comments) 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  Is on NOAC for now.  No ASA with it. Not allergic per patient.     CBC Latest Ref Rng & Units 07/11/2021 06/23/2021 06/22/2021  WBC 4.0 - 10.5 K/uL 9.9 6.0 6.2  Hemoglobin 13.0 - 17.0 g/dL 13.6 10.8(L) 11.1(L)  Hematocrit 39.0 - 52.0 % 41.1 32.4(L) 34.2(L)  Platelets 150 - 400 K/uL 158 163 162      CMP     Component Value Date/Time   NA 135 07/11/2021 1317   NA 136 11/08/2013 1530   K 4.2 07/11/2021 1317   K 3.4 (L)  11/08/2013 1530   CL 100 07/11/2021 1317   CL 102 11/08/2013 1530   CO2  21 (L) 07/11/2021 1317   CO2 27 11/08/2013 1530   GLUCOSE 139 (H) 07/11/2021 1317   GLUCOSE 150 (H) 11/08/2013 1530   BUN 29 (H) 07/11/2021 1317   BUN 16 11/08/2013 1530   CREATININE 1.85 (H) 07/11/2021 1317   CREATININE 1.07 11/08/2013 1530   CALCIUM 9.5 07/11/2021 1317   CALCIUM 9.3 11/08/2013 1530   PROT 7.4 07/11/2021 1317   ALBUMIN 3.8 07/11/2021 1317   AST 28 07/11/2021 1317   ALT 26 07/11/2021 1317   ALKPHOS 121 07/11/2021 1317   BILITOT 0.9 07/11/2021 1317   GFRNONAA 39 (L) 07/11/2021 1317   GFRNONAA >60 11/08/2013 1530   GFRAA >60 01/30/2019 0652   GFRAA >60 11/08/2013 1530     No results found.     Assessment & Plan:   1. Atherosclerosis of native artery of left lower extremity with rest pain (HCC) Recommend:  The patient has evidence of severe atherosclerotic changes of the left lower extremities with rest pain that is associated with preulcerative changes and impending tissue loss of the foot.  This represents a limb threatening ischemia and places the patient at the risk for limb loss.  Patient should undergo angiography of the lower extremities with the hope for intervention for limb salvage.  The risks and benefits as well as the alternative therapies was discussed in detail with the patient.  All questions were answered.  Patient agrees to proceed with angiography.  The patient will follow up with me in the office after the procedure.       2. Primary hypertension Continue antihypertensive medications as already ordered, these medications have been reviewed and there are no changes at this time.   3. Hyperlipidemia, unspecified hyperlipidemia type Continue statin as ordered and reviewed, no changes at this time   4. Discoloration of skin of hand The symptoms sound initially concerning for Raynaud's disease however it is noted that the patient does not have palpable radial pulses.   Currently, this issue is only transient and there are no ischemic symptoms.  We will have the patient follow-up with ultrasounds on this once he has underwent intervention for his lower extremity as that is more concerning at this time. - VAS [place] UPPER EXTREMITY ARTERIAL DUPLEX; Future   Current Outpatient Medications on File Prior to Visit  Medication Sig Dispense Refill   acetaminophen (TYLENOL) 325 MG tablet Take 2 tablets (650 mg total) by mouth every 6 (six) hours as needed for fever. 20 tablet 0   apixaban (ELIQUIS) 5 MG TABS tablet Take by mouth.     atorvastatin (LIPITOR) 80 MG tablet Take 80 mg by mouth daily.     clonazePAM (KLONOPIN) 0.25 MG disintegrating tablet Take 1-2 tablets (0.25-0.5 mg total) by mouth 3 (three) times daily as needed (anxiety/agitation). 20 tablet 0   clopidogrel (PLAVIX) 75 MG tablet Take 1 tablet (75 mg total) by mouth daily. 30 tablet 0   famotidine (PEPCID) 40 MG tablet Take 1 tablet by mouth every morning.     furosemide (LASIX) 20 MG tablet Take 20 mg by mouth daily as needed (Edema (Or weight gain of >2lbs overnight or 5lbs in one week)).     ketoconazole (NIZORAL) 2 % shampoo Apply topically.     latanoprost (XALATAN) 0.005 % ophthalmic solution Place 1 drop into both eyes at bedtime. 2.5 mL 12   losartan (COZAAR) 25 MG tablet Take 25 mg by mouth daily.     metoprolol succinate (TOPROL-XL) 25 MG 24 hr tablet  Take 1 tablet by mouth at bedtime.     nitroGLYCERIN (NITROSTAT) 0.4 MG SL tablet Place 1 tablet (0.4 mg total) under the tongue every 5 (five) minutes as needed for chest pain. 20 tablet 12   omeprazole (PRILOSEC) 40 MG capsule Take 40 mg by mouth daily.     polyethylene glycol (MIRALAX / GLYCOLAX) 17 g packet Take 17 g by mouth daily as needed for mild constipation or moderate constipation. 14 each 0   potassium chloride SA (KLOR-CON) 20 MEQ tablet Take by mouth. 6 tablets     ranolazine (RANEXA) 500 MG 12 hr tablet Take 500 mg by mouth 2 (two)  times daily.     spironolactone (ALDACTONE) 25 MG tablet Take 12.5 mg by mouth.     thiamine 100 MG tablet Take 1 tablet (100 mg total) by mouth daily. 30 tablet 0   acidophilus (RISAQUAD) CAPS capsule Take 2 capsules by mouth 3 (three) times daily. (Patient not taking: Reported on 08/02/2021) 30 capsule 0   brimonidine (ALPHAGAN) 0.2 % ophthalmic solution Place 1 drop into both eyes 2 (two) times daily. (Patient not taking: Reported on 10/19/2021) 5 mL 12   Calcipotriene-Betameth Diprop (ENSTILAR) 0.005-0.064 % FOAM APPLY THIN LAYER TO AFFECTED AREAS ONCE DAILY UNTIL FLAT AND ASYMPTOMATIC, MAX 4 WEEKS (Patient not taking: Reported on 08/02/2021)     Chlorhexidine Gluconate Cloth 2 % PADS Apply 6 each topically daily. (Patient not taking: Reported on 08/02/2021) 1 each 0   diclofenac Sodium (VOLTAREN) 1 % GEL Apply 4 g topically 4 (four) times daily. (Patient not taking: Reported on 10/28/2021)     docusate sodium (COLACE) 100 MG capsule Take 1 capsule (100 mg total) by mouth 2 (two) times daily. (Patient not taking: Reported on 10/19/2021) 10 capsule 0   DULoxetine (CYMBALTA) 60 MG capsule Take 1 capsule (60 mg total) by mouth 2 (two) times daily. (Patient not taking: Reported on 06/18/2021) 20 capsule 0   esomeprazole (NEXIUM) 40 MG capsule Take 1 capsule by mouth daily. (Patient not taking: Reported on 06/18/2021)     ferrous sulfate 325 (65 FE) MG EC tablet Take 1 tablet by mouth daily with breakfast. (Patient not taking: Reported on 05/18/9561)     folic acid (FOLVITE) 1 MG tablet Take 1 tablet (1 mg total) by mouth daily. (Patient not taking: Reported on 08/02/2021) 30 tablet 0   glipiZIDE (GLUCOTROL) 10 MG tablet Take 5 mg by mouth daily before breakfast. (Patient not taking: Reported on 10/19/2021)     guaiFENesin (MUCINEX) 600 MG 12 hr tablet Take 1 tablet (600 mg total) by mouth 2 (two) times daily. (Patient not taking: Reported on 08/02/2021) 30 tablet 0   isosorbide mononitrate (IMDUR) 30 MG 24 hr  tablet Take 1 tablet (30 mg total) by mouth daily. (Patient not taking: Reported on 08/02/2021) 30 tablet 0   Magnesium 250 MG TABS Take by mouth. (Patient not taking: Reported on 04/09/2021)     Multiple Vitamin (MULTIVITAMIN WITH MINERALS) TABS tablet Take 1 tablet by mouth daily. (Patient not taking: Reported on 10/19/2021) 30 tablet 0   ondansetron (ZOFRAN) 4 MG/2ML SOLN injection Inject 2 mLs (4 mg total) into the vein every 6 (six) hours as needed for nausea. (Patient not taking: Reported on 08/02/2021) 2 mL 0   phenol (CHLORASEPTIC) 1.4 % LIQD Use as directed 1 spray in the mouth or throat as needed for throat irritation / pain. (Patient not taking: Reported on 08/02/2021) 10 mL 0   SENNOSIDES  PO Take by mouth. (Patient not taking: Reported on 06/18/2021)     sucralfate (CARAFATE) 1 g tablet Take 1 tablet by mouth 4 (four) times daily -  before meals and at bedtime. (Patient not taking: Reported on 07/16/2020)     No current facility-administered medications on file prior to visit.    There are no Patient Instructions on file for this visit. No follow-ups on file.   Kris Hartmann, NP

## 2021-11-02 ENCOUNTER — Encounter: Payer: BC Managed Care – PPO | Attending: Cardiovascular Disease

## 2021-11-02 ENCOUNTER — Telehealth (INDEPENDENT_AMBULATORY_CARE_PROVIDER_SITE_OTHER): Payer: Self-pay

## 2021-11-02 ENCOUNTER — Ambulatory Visit: Payer: BC Managed Care – PPO | Admitting: Physical Therapy

## 2021-11-02 ENCOUNTER — Telehealth: Payer: Self-pay | Admitting: Physical Therapy

## 2021-11-02 DIAGNOSIS — Z9861 Coronary angioplasty status: Secondary | ICD-10-CM | POA: Insufficient documentation

## 2021-11-02 DIAGNOSIS — Z951 Presence of aortocoronary bypass graft: Secondary | ICD-10-CM | POA: Insufficient documentation

## 2021-11-02 DIAGNOSIS — I214 Non-ST elevation (NSTEMI) myocardial infarction: Secondary | ICD-10-CM | POA: Insufficient documentation

## 2021-11-02 NOTE — Telephone Encounter (Signed)
Called pt to inquiry about absence from PT apt. Did not reach, so left VM instructing pt to call back and reschedule.

## 2021-11-02 NOTE — Telephone Encounter (Signed)
Spoke with the patient's wife and the patient is scheduled with Dr. Lucky Cowboy for a LLE angio on 11/04/21 with a 9:45 am arrival time to the [facility]. Pre-procedure instructions were discussed and patient's wife understood.

## 2021-11-04 ENCOUNTER — Other Ambulatory Visit (INDEPENDENT_AMBULATORY_CARE_PROVIDER_SITE_OTHER): Payer: Self-pay | Admitting: Nurse Practitioner

## 2021-11-04 ENCOUNTER — Encounter: Payer: Self-pay | Admitting: Vascular Surgery

## 2021-11-04 ENCOUNTER — Encounter
Admission: RE | Disposition: A | Discharge status: Home / Self Care | Payer: Self-pay | Source: Home / Self Care | Attending: Vascular Surgery

## 2021-11-04 ENCOUNTER — Ambulatory Visit
Admission: RE | Admit: 2021-11-04 | Discharge: 2021-11-04 | Disposition: A | Discharge status: Home / Self Care | Payer: BC Managed Care – PPO | Attending: Vascular Surgery | Admitting: Vascular Surgery

## 2021-11-04 DIAGNOSIS — Z950 Presence of cardiac pacemaker: Secondary | ICD-10-CM | POA: Diagnosis not present

## 2021-11-04 DIAGNOSIS — I10 Essential (primary) hypertension: Secondary | ICD-10-CM | POA: Diagnosis not present

## 2021-11-04 DIAGNOSIS — E785 Hyperlipidemia, unspecified: Secondary | ICD-10-CM | POA: Insufficient documentation

## 2021-11-04 DIAGNOSIS — Z7984 Long term (current) use of oral hypoglycemic drugs: Secondary | ICD-10-CM | POA: Insufficient documentation

## 2021-11-04 DIAGNOSIS — I70222 Atherosclerosis of native arteries of extremities with rest pain, left leg: Secondary | ICD-10-CM | POA: Diagnosis not present

## 2021-11-04 DIAGNOSIS — E1151 Type 2 diabetes mellitus with diabetic peripheral angiopathy without gangrene: Secondary | ICD-10-CM | POA: Diagnosis not present

## 2021-11-04 DIAGNOSIS — I70229 Atherosclerosis of native arteries of extremities with rest pain, unspecified extremity: Secondary | ICD-10-CM

## 2021-11-04 HISTORY — PX: LOWER EXTREMITY ANGIOGRAPHY: CATH118251

## 2021-11-04 LAB — GLUCOSE, CAPILLARY: Glucose-Capillary: 103 mg/dL — ABNORMAL HIGH (ref 70–99)

## 2021-11-04 LAB — CREATININE, SERUM
Creatinine, Ser: 1.53 mg/dL — ABNORMAL HIGH (ref 0.61–1.24)
GFR, Estimated: 49 mL/min — ABNORMAL LOW (ref >60)

## 2021-11-04 LAB — BUN: BUN: 30 mg/dL — ABNORMAL HIGH (ref 8–23)

## 2021-11-04 SURGERY — LOWER EXTREMITY ANGIOGRAPHY
Anesthesia: Moderate Sedation | Site: Leg Lower | Laterality: Left

## 2021-11-04 MED ORDER — HEPARIN SODIUM (PORCINE) 1000 UNIT/ML IJ SOLN
INTRAMUSCULAR | Status: DC | PRN
  Administered 2021-11-04: 5000 [IU] via INTRAVENOUS

## 2021-11-04 MED ORDER — DIPHENHYDRAMINE HCL 50 MG/ML IJ SOLN
INTRAMUSCULAR | Status: DC | PRN
  Administered 2021-11-04: 50 mg via INTRAVENOUS

## 2021-11-04 MED ORDER — ACETAMINOPHEN 325 MG PO TABS: 650.0000 mg | ORAL_TABLET | ORAL | Status: DC | PRN

## 2021-11-04 MED ORDER — ONDANSETRON HCL 4 MG/2ML IJ SOLN: 4.0000 mg | Freq: Four times a day (QID) | INTRAMUSCULAR | Status: DC | PRN

## 2021-11-04 MED ORDER — MIDAZOLAM HCL 5 MG/5ML IJ SOLN
INTRAMUSCULAR | Status: AC
  Filled 2021-11-04: qty 5

## 2021-11-04 MED ORDER — CEFAZOLIN SODIUM-DEXTROSE 2-4 GM/100ML-% IV SOLN
2.0000 g | Freq: Once | INTRAVENOUS | Status: AC
  Administered 2021-11-04: 2 g via INTRAVENOUS

## 2021-11-04 MED ORDER — FENTANYL CITRATE (PF) 100 MCG/2ML IJ SOLN
INTRAMUSCULAR | Status: DC | PRN
  Administered 2021-11-04: 50 ug via INTRAVENOUS
  Administered 2021-11-04 (×2): 25 ug via INTRAVENOUS

## 2021-11-04 MED ORDER — FAMOTIDINE 20 MG PO TABS: 40.0000 mg | ORAL_TABLET | Freq: Once | ORAL | Status: DC | PRN

## 2021-11-04 MED ORDER — SODIUM CHLORIDE 0.9 % IV SOLN: INTRAVENOUS | Status: AC

## 2021-11-04 MED ORDER — DIPHENHYDRAMINE HCL 50 MG/ML IJ SOLN
INTRAMUSCULAR | Status: AC
  Filled 2021-11-04: qty 1

## 2021-11-04 MED ORDER — KETOROLAC TROMETHAMINE 60 MG/2ML IM SOLN
INTRAMUSCULAR | Status: AC
  Filled 2021-11-04: qty 2

## 2021-11-04 MED ORDER — LABETALOL HCL 5 MG/ML IV SOLN: 10.0000 mg | INTRAVENOUS | Status: DC | PRN

## 2021-11-04 MED ORDER — METHYLPREDNISOLONE SODIUM SUCC 125 MG IJ SOLR: 125.0000 mg | Freq: Once | INTRAMUSCULAR | Status: DC | PRN

## 2021-11-04 MED ORDER — SODIUM CHLORIDE 0.9% FLUSH: 3.0000 mL | INTRAVENOUS | Status: DC | PRN

## 2021-11-04 MED ORDER — FENTANYL CITRATE PF 50 MCG/ML IJ SOSY
PREFILLED_SYRINGE | INTRAMUSCULAR | Status: AC
  Filled 2021-11-04: qty 2

## 2021-11-04 MED ORDER — MIDAZOLAM HCL 2 MG/ML PO SYRP: 8.0000 mg | ORAL_SOLUTION | Freq: Once | ORAL | Status: DC | PRN

## 2021-11-04 MED ORDER — SODIUM CHLORIDE 0.9 % IV SOLN
INTRAVENOUS | Status: DC
  Administered 2021-11-04: 1000 mL via INTRAVENOUS

## 2021-11-04 MED ORDER — SODIUM CHLORIDE 0.9% FLUSH: 3.0000 mL | Freq: Two times a day (BID) | INTRAVENOUS | Status: DC

## 2021-11-04 MED ORDER — HEPARIN SODIUM (PORCINE) 1000 UNIT/ML IJ SOLN
INTRAMUSCULAR | Status: AC
  Filled 2021-11-04: qty 10

## 2021-11-04 MED ORDER — HYDRALAZINE HCL 20 MG/ML IJ SOLN: 5.0000 mg | INTRAMUSCULAR | Status: DC | PRN

## 2021-11-04 MED ORDER — DIPHENHYDRAMINE HCL 50 MG/ML IJ SOLN: 50.0000 mg | Freq: Once | INTRAMUSCULAR | Status: DC | PRN

## 2021-11-04 MED ORDER — KETOROLAC TROMETHAMINE 15 MG/ML IJ SOLN
15.0000 mg | Freq: Once | INTRAMUSCULAR | Status: AC
  Administered 2021-11-04: 15 mg via INTRAVENOUS
  Filled 2021-11-04: qty 1

## 2021-11-04 MED ORDER — SODIUM CHLORIDE 0.9 % IV SOLN: 250.0000 mL | INTRAVENOUS | Status: DC | PRN

## 2021-11-04 MED ORDER — MIDAZOLAM HCL 2 MG/2ML IJ SOLN
INTRAMUSCULAR | Status: DC | PRN
  Administered 2021-11-04 (×3): 1 mg via INTRAVENOUS
  Administered 2021-11-04: 2 mg via INTRAVENOUS

## 2021-11-04 MED ORDER — FENTANYL CITRATE (PF) 100 MCG/2ML IJ SOLN: 12.5000 ug | Freq: Once | INTRAMUSCULAR | Status: DC | PRN

## 2021-11-04 MED ORDER — IODIXANOL 320 MG/ML IV SOLN
INTRAVENOUS | Status: DC | PRN
  Administered 2021-11-04: 70 mL

## 2021-11-04 SURGICAL SUPPLY — 28 items
BALLN DORADO 5X200X135 (BALLOONS) ×2
BALLN LUTONIX 018 4X150X130 (BALLOONS) ×2
BALLN LUTONIX 018 5X300X130 (BALLOONS) ×2
BALLN ULTRVRSE 3X300X130 (BALLOONS) ×2
BALLOON DORADO 5X200X135 (BALLOONS) IMPLANT
BALLOON LUTONIX 018 4X150X130 (BALLOONS) IMPLANT
BALLOON LUTONIX 018 5X300X130 (BALLOONS) IMPLANT
BALLOON ULTRVRSE 3X300X130 (BALLOONS) IMPLANT
CANNULA 5F STIFF (CANNULA) ×1 IMPLANT
CATH ANGIO 5F PIGTAIL 65CM (CATHETERS) ×1 IMPLANT
CATH BEACON 5 .035 65 KMP TIP (CATHETERS) ×1 IMPLANT
CATH NAVICROSS ANGLED 90CM (MICROCATHETER) ×1 IMPLANT
CATH ROTAREX 135 6FR (CATHETERS) ×1 IMPLANT
CATH SEEKER .035X150CM (CATHETERS) ×1 IMPLANT
COVER PROBE U/S 5X48 (MISCELLANEOUS) ×1 IMPLANT
DEVICE SAFEGUARD 24CM (GAUZE/BANDAGES/DRESSINGS) ×1 IMPLANT
DEVICE STARCLOSE SE CLOSURE (Vascular Products) ×1 IMPLANT
GLIDEWIRE ADV .035X260CM (WIRE) ×1 IMPLANT
KIT ENCORE 26 ADVANTAGE (KITS) ×1 IMPLANT
PACK ANGIOGRAPHY (CUSTOM PROCEDURE TRAY) ×2 IMPLANT
SHEATH ANL2 6FRX45 HC (SHEATH) ×1 IMPLANT
SHEATH BRITE TIP 5FRX11 (SHEATH) ×1 IMPLANT
STENT VIABAHN 6X150X120 (Permanent Stent) ×1 IMPLANT
STENT VIABAHN 6X250X120 (Permanent Stent) ×1 IMPLANT
SYR MEDRAD MARK 7 150ML (SYRINGE) ×1 IMPLANT
TUBING CONTRAST HIGH PRESS 72 (TUBING) ×1 IMPLANT
WIRE G V18X300CM (WIRE) ×1 IMPLANT
WIRE GUIDERIGHT .035X150 (WIRE) ×1 IMPLANT

## 2021-11-04 NOTE — OR Nursing (Signed)
1700 received report from [employer]. She reported patient as pressure dressing right groin due to persistent oozing right groin. Vascular staff had injected site with lidocaine. But trickle ooze continued. So they placed gauze with xerofoam tape to hold pressure.  At 1800 Dr Lucky Cowboy assessed site. Dressing removed. Pt continued to have slow ooze. Patient stated strong wish to go home instead of being admitted. Another pressure dressing applied. Pt given strict instructions regarding supporting site with any movements. Wife included in discussion. Dr Lucky Cowboy instructed them that in event he starts with saturating bleeding he needs to return to ED. Otherwise take pressure dressing off in morning replace with gauze and tegaderm. Stressed no heavy lifting for two days.  1820 patient discharged home in care of wife.

## 2021-11-04 NOTE — Op Note (Signed)
[facility]  Percutaneous Study/Intervention Procedural Note   Date of Surgery: 11/04/2021  Surgeon(s):Jessicah Croll    Assistants:none  Pre-operative Diagnosis: PAD with claudication and early rest pain left lower extremity  Post-operative diagnosis:  Same  Procedure(s) Performed:             1.  Ultrasound guidance for vascular access right femoral artery             2.  Catheter placement into left common femoral artery from right femoral approach             3.  Aortogram and selective left lower extremity angiogram             4.  Percutaneous transluminal angioplasty of left popliteal artery and tibioperoneal trunk with 3 mm diameter angioplasty balloon             5.  Mechanical thrombectomy of the left SFA and popliteal arteries with the [place] Rex device  6.  Percutaneous transluminal angioplasty of the left SFA and popliteal arteries with 5 mm diameter by 30 cm length Lutonix drug-coated angioplasty balloon  7.  Viabahn stent placement x2 to the left SFA and popliteal arteries with 6 mm diameter by 25 cm length and 6 mm diameter by 15 cm length Viabahn stent             8.  StarClose closure device right femoral artery  EBL: 50 cc  Contrast: 70 cc  Fluoro Time: 13.2 minutes  Moderate Conscious Sedation Time: approximately 75 minutes using 5 mg of Versed and 100 mcg of Fentanyl              Indications:  Patient is a 71 y.o.male with severe and profound peripheral arterial disease.  He is status post extensive aortoiliac intervention and is already had a right leg bypass. The patient has noninvasive study showing markedly reduced perfusion on the left leg. The patient is brought in for angiography for further evaluation and potential treatment.  Due to the limb threatening nature of the situation, angiogram was performed for attempted limb salvage. The patient is aware that if the procedure fails, amputation would be expected.  The patient also understands  that even with successful revascularization, amputation may still be required due to the severity of the situation.  Risks and benefits are discussed and informed consent is obtained.   Procedure:  The patient was identified and appropriate procedural time out was performed.  The patient was then placed supine on the table and prepped and draped in the usual sterile fashion. Moderate conscious sedation was administered during a face to face encounter with the patient throughout the procedure with my supervision of the RN administering medicines and monitoring the patient's vital signs, pulse oximetry, telemetry and mental status throughout from the start of the procedure until the patient was taken to the recovery room. Ultrasound was used to evaluate the right common femoral artery.  It was patent .  A digital ultrasound image was acquired.  A Seldinger needle was used to access the right common femoral artery under direct ultrasound guidance and a permanent image was performed.  A 0.035 J wire was advanced without resistance and a 5Fr sheath was placed.  Pigtail catheter was placed into the aorta and an AP aortogram was performed. This demonstrated normal renal arteries and normal aorta and iliac segments without significant stenosis with patency of the previously placed stents. I then crossed the aortic bifurcation and advanced  to the left femoral head. Selective left lower extremity angiogram was then performed. This demonstrated diffusely diseased left superficial femoral and popliteal arteries with multiple areas of high-grade stenosis or occlusion throughout and very sluggish flow distally.  There appeared to be two-vessel runoff through the peroneal artery and posterior tibial arteries although flow was quite sluggish and this was difficult to discern. It was felt that it was in the patient's best interest to proceed with intervention after these images to avoid a second procedure and a larger amount of  contrast and fluoroscopy based off of the findings from the initial angiogram. The patient was systemically heparinized and a 6 [place] Ansell sheath was then placed over the [employer] wire. I then used a Kumpe catheter and ultimately a Ferd Hibbs cross and then seeker catheter due to the tight calcific disease and the advantage wire to navigate through the SFA and popliteal disease and get down into the peroneal artery where intraluminal flow was confirmed.  We then exchanged for a V 18 wire.  A 3 mm diameter by 30 cm length angioplasty balloon was inflated from the tibioperoneal trunk up to the popliteal artery and distal SFA both to treat the distal area as well as to predilate the SFA and above-knee popliteal artery.  A second inflation was performed in the proximal to mid SFA.  I then performed mechanical thrombectomy to debulk the chronic thrombus with the [place] Rex device in the left SFA and popliteal arteries.  2 passes were made with the [place] Rex device in the SFA and above-knee popliteal artery.  I then performed angioplasty with a 5 mm diameter by 30 cm length Lutonix drug-coated angioplasty balloon to the SFA and popliteal arteries.  Completion imaging showed multiple areas of greater than 70% residual stenosis stent placement is indicated.  A 6 mm diameter by 25 cm length Viabahn stent as well as a 6 mm diameter by 15 cm length Viabahn stent were deployed from the origin of the SFA down to the proximal popliteal artery.  These were postdilated with 5 mm diameter high-pressure angioplasty balloon with excellent angiographic completion result and less than 15% residual stenosis.  There was now two-vessel runoff distally through both the peroneal artery and the posterior tibial artery. I elected to terminate the procedure. The sheath was removed and StarClose closure device was deployed in the right femoral artery with excellent hemostatic result. The patient was taken to the recovery room in stable  condition having tolerated the procedure well.  Findings:               Aortogram:  This demonstrated normal renal arteries and normal aorta and iliac segments without significant stenosis with patency of the previously placed stents.             Left lower Extremity: This demonstrated diffusely diseased left superficial femoral and popliteal arteries with multiple areas of high-grade stenosis or occlusion throughout and very sluggish flow distally.  There appeared to be two-vessel runoff through the peroneal artery and posterior tibial arteries although flow was quite sluggish and this was difficult to discern.   Disposition: Patient was taken to the recovery room in stable condition having tolerated the procedure well.  Complications: None  [name] 11/04/2021 1:42 PM   This note was created with Dragon Medical transcription system. Any errors in dictation are purely unintentional.

## 2021-11-04 NOTE — Interval H&P Note (Signed)
History and Physical Interval Note:  11/04/2021 10:25 AM  Damon English  has presented today for surgery, with the diagnosis of LLE Angio   BARD   ASO w rest pain.  The various methods of treatment have been discussed with the patient and family. After consideration of risks, benefits and other options for treatment, the patient has consented to  Procedure(s): LOWER EXTREMITY ANGIOGRAPHY (Left) as a surgical intervention.  The patient's history has been reviewed, patient examined, no change in status, stable for surgery.  I have reviewed the patient's chart and labs.  Questions were answered to the patient's satisfaction.     [name]

## 2021-11-05 ENCOUNTER — Encounter: Payer: Self-pay | Admitting: Vascular Surgery

## 2021-11-09 ENCOUNTER — Ambulatory Visit: Payer: BC Managed Care – PPO | Admitting: Physical Therapy

## 2021-11-09 ENCOUNTER — Telehealth (INDEPENDENT_AMBULATORY_CARE_PROVIDER_SITE_OTHER): Payer: Self-pay

## 2021-11-09 NOTE — Telephone Encounter (Signed)
Patient left a voicemail stating that he was having left leg pain and is hard walk on the left leg. Patient had left le angio on 11/04/21. I spoke with Dr Lucky Cowboy and recommended that we can call in Tramadol 50mg  1 tablet by mouth every 6 hours prn #30 into pharmacy. Also if the patient symptoms haven't gotten better within few days he can come in with ABI see himself or fallon. Patient was made aware with medical advice and verbalized understanding.

## 2021-11-10 ENCOUNTER — Encounter: Payer: Self-pay | Admitting: *Deleted

## 2021-11-10 DIAGNOSIS — Z9861 Coronary angioplasty status: Secondary | ICD-10-CM

## 2021-11-10 NOTE — Progress Notes (Signed)
Cardiac Individual Treatment Plan  Patient Details  Name: Damon English MRN: [id number] Date of Birth: [birth year] Referring Provider:   Flowsheet Row Cardiac Rehab from 08/19/2021 in [facility] Cardiac and Pulmonary Rehab  Referring Provider Cloretta Ned MD       Initial Encounter Date:  Flowsheet Row Cardiac Rehab from 08/19/2021 in [facility] Cardiac and Pulmonary Rehab  Date 08/19/21       Visit Diagnosis: S/P PTCA (percutaneous transluminal coronary angioplasty)  Patient's Home Medications on Admission:  Current Outpatient Medications:    acetaminophen (TYLENOL) 325 MG tablet, Take 2 tablets (650 mg total) by mouth every 6 (six) hours as needed for fever., Disp: 20 tablet, Rfl: 0   acidophilus (RISAQUAD) CAPS capsule, Take 2 capsules by mouth 3 (three) times daily. (Patient not taking: Reported on 08/02/2021), Disp: 30 capsule, Rfl: 0   apixaban (ELIQUIS) 5 MG TABS tablet, Take by mouth., Disp: , Rfl:    atorvastatin (LIPITOR) 80 MG tablet, Take 80 mg by mouth daily., Disp: , Rfl:    brimonidine (ALPHAGAN) 0.2 % ophthalmic solution, Place 1 drop into both eyes 2 (two) times daily. (Patient not taking: Reported on 10/19/2021), Disp: 5 mL, Rfl: 12   Calcipotriene-Betameth Diprop (ENSTILAR) 0.005-0.064 % FOAM, APPLY THIN LAYER TO AFFECTED AREAS ONCE DAILY UNTIL FLAT AND ASYMPTOMATIC, MAX 4 WEEKS (Patient not taking: Reported on 08/02/2021), Disp: , Rfl:    Chlorhexidine Gluconate Cloth 2 % PADS, Apply 6 each topically daily. (Patient not taking: Reported on 08/02/2021), Disp: 1 each, Rfl: 0   clonazePAM (KLONOPIN) 0.25 MG disintegrating tablet, Take 1-2 tablets (0.25-0.5 mg total) by mouth 3 (three) times daily as needed (anxiety/agitation)., Disp: 20 tablet, Rfl: 0   clopidogrel (PLAVIX) 75 MG tablet, Take 1 tablet (75 mg total) by mouth daily., Disp: 30 tablet, Rfl: 0   diclofenac Sodium (VOLTAREN) 1 % GEL, Apply 4 g topically 4 (four) times daily. (Patient not taking: Reported on  10/28/2021), Disp: , Rfl:    docusate sodium (COLACE) 100 MG capsule, Take 1 capsule (100 mg total) by mouth 2 (two) times daily. (Patient not taking: Reported on 10/19/2021), Disp: 10 capsule, Rfl: 0   DULoxetine (CYMBALTA) 60 MG capsule, Take 1 capsule (60 mg total) by mouth 2 (two) times daily. (Patient not taking: Reported on 06/18/2021), Disp: 20 capsule, Rfl: 0   esomeprazole (NEXIUM) 40 MG capsule, Take 1 capsule by mouth daily. (Patient not taking: Reported on 06/18/2021), Disp: , Rfl:    famotidine (PEPCID) 40 MG tablet, Take 1 tablet by mouth every morning., Disp: , Rfl:    ferrous sulfate 325 (65 FE) MG EC tablet, Take 1 tablet by mouth daily with breakfast. (Patient not taking: Reported on 10/19/2021), Disp: , Rfl:    folic acid (FOLVITE) 1 MG tablet, Take 1 tablet (1 mg total) by mouth daily. (Patient not taking: Reported on 08/02/2021), Disp: 30 tablet, Rfl: 0   furosemide (LASIX) 20 MG tablet, Take 20 mg by mouth daily as needed (Edema (Or weight gain of >2lbs overnight or 5lbs in one week))., Disp: , Rfl:    glipiZIDE (GLUCOTROL) 10 MG tablet, Take 5 mg by mouth daily before breakfast. (Patient not taking: Reported on 10/19/2021), Disp: , Rfl:    guaiFENesin (MUCINEX) 600 MG 12 hr tablet, Take 1 tablet (600 mg total) by mouth 2 (two) times daily. (Patient not taking: Reported on 08/02/2021), Disp: 30 tablet, Rfl: 0   isosorbide mononitrate (IMDUR) 30 MG 24 hr tablet, Take 1 tablet (30 mg total)  by mouth daily. (Patient not taking: Reported on 08/02/2021), Disp: 30 tablet, Rfl: 0   ketoconazole (NIZORAL) 2 % shampoo, Apply topically., Disp: , Rfl:    latanoprost (XALATAN) 0.005 % ophthalmic solution, Place 1 drop into both eyes at bedtime., Disp: 2.5 mL, Rfl: 12   losartan (COZAAR) 25 MG tablet, Take 25 mg by mouth daily., Disp: , Rfl:    Magnesium 250 MG TABS, Take by mouth. (Patient not taking: Reported on 04/09/2021), Disp: , Rfl:    metoprolol succinate (TOPROL-XL) 25 MG 24 hr tablet, Take 1  tablet by mouth at bedtime., Disp: , Rfl:    Multiple Vitamin (MULTIVITAMIN WITH MINERALS) TABS tablet, Take 1 tablet by mouth daily. (Patient not taking: Reported on 10/19/2021), Disp: 30 tablet, Rfl: 0   nitroGLYCERIN (NITROSTAT) 0.4 MG SL tablet, Place 1 tablet (0.4 mg total) under the tongue every 5 (five) minutes as needed for chest pain., Disp: 20 tablet, Rfl: 12   omeprazole (PRILOSEC) 40 MG capsule, Take 40 mg by mouth daily., Disp: , Rfl:    ondansetron (ZOFRAN) 4 MG/2ML SOLN injection, Inject 2 mLs (4 mg total) into the vein every 6 (six) hours as needed for nausea. (Patient not taking: Reported on 08/02/2021), Disp: 2 mL, Rfl: 0   phenol (CHLORASEPTIC) 1.4 % LIQD, Use as directed 1 spray in the mouth or throat as needed for throat irritation / pain. (Patient not taking: Reported on 08/02/2021), Disp: 10 mL, Rfl: 0   polyethylene glycol (MIRALAX / GLYCOLAX) 17 g packet, Take 17 g by mouth daily as needed for mild constipation or moderate constipation., Disp: 14 each, Rfl: 0   potassium chloride SA (KLOR-CON) 20 MEQ tablet, Take by mouth. 6 tablets, Disp: , Rfl:    ranolazine (RANEXA) 500 MG 12 hr tablet, Take 500 mg by mouth 2 (two) times daily., Disp: , Rfl:    SENNOSIDES PO, Take by mouth. (Patient not taking: Reported on 06/18/2021), Disp: , Rfl:    spironolactone (ALDACTONE) 25 MG tablet, Take 12.5 mg by mouth., Disp: , Rfl:    sucralfate (CARAFATE) 1 g tablet, Take 1 tablet by mouth 4 (four) times daily -  before meals and at bedtime. (Patient not taking: Reported on 07/16/2020), Disp: , Rfl:    thiamine 100 MG tablet, Take 1 tablet (100 mg total) by mouth daily., Disp: 30 tablet, Rfl: 0  Past Medical History: Past Medical History:  Diagnosis Date   Alcohol use    "cutting back" but still heavy and daily, multiple shots of bourbon each night   Diabetes mellitus without complication ([place])    History of tobacco use    reportedly quit in 2013   Hypertension    Pulmonary embolism ([place])  2012   unprovoked   PVD (peripheral vascular disease) ([place])     Tobacco Use: Social History   Tobacco Use  Smoking Status Former   Packs/day: 2.00   Years: 40.00   Pack years: 80.00   Types: Cigarettes   Quit date: 11/27/2010   Years since quitting: 10.9  Smokeless Tobacco Never  Tobacco Comments   Quit in 2012    Labs: Recent Review Flowsheet Data     Labs for ITP Cardiac and Pulmonary Rehab Latest Ref Rng & Units 01/28/2019 01/29/2019 06/18/2021 06/18/2021 06/18/2021   Cholestrol 0 - 200 mg/dL 168 143 - - -   LDLCALC 0 - 99 mg/dL 113(H) 93 - - -   HDL >40 mg/dL 36(L) 33(L) - - -   Trlycerides <  150 mg/dL 97 87 - - -   Hemoglobin A1c 4.8 - 5.6 % - - - - 6.1(H)   PHART 7.350 - 7.450 - - 7.58(H) 7.48(H) -   PCO2ART 32.0 - 48.0 mmHg - - 28(L) 47 -   HCO3 20.0 - 28.0 mmol/L - - 26.3 35.0(H) -   O2SAT % - - 99.9 99.0 -        Exercise Target Goals: Exercise Program Goal: Individual exercise prescription set using results from initial 6 min walk test and THRR while considering  patients activity barriers and safety.   Exercise Prescription Goal: Initial exercise prescription builds to 30-45 minutes a day of aerobic activity, 2-3 days per week.  Home exercise guidelines will be given to patient during program as part of exercise prescription that the participant will acknowledge.   Education: Aerobic Exercise: - Group verbal and visual presentation on the components of exercise prescription. Introduces F.I.T.T principle from ACSM for exercise prescriptions.  Reviews F.I.T.T. principles of aerobic exercise including progression. Written material given at graduation. Flowsheet Row Cardiac Rehab from 09/19/2019 in [facility] Cardiac and Pulmonary Rehab  Date 08/22/19  Educator jh  Instruction Review Code 1- Verbalizes Understanding       Education: Resistance Exercise: - Group verbal and visual presentation on the components of exercise prescription. Introduces F.I.T.T principle from  ACSM for exercise prescriptions  Reviews F.I.T.T. principles of resistance exercise including progression. Written material given at graduation.    Education: Exercise & Equipment Safety: - Individual verbal instruction and demonstration of equipment use and safety with use of the equipment. Flowsheet Row Cardiac Rehab from 10/07/2021 in [facility] Cardiac and Pulmonary Rehab  Date 08/19/21  Educator [place]  Instruction Review Code 1- Verbalizes Understanding       Education: Exercise Physiology & General Exercise Guidelines: - Group verbal and written instruction with models to review the exercise physiology of the cardiovascular system and associated critical values. Provides general exercise guidelines with specific guidelines to those with heart or lung disease.  Flowsheet Row Cardiac Rehab from 10/07/2021 in [facility] Cardiac and Pulmonary Rehab  Date 10/07/21  Educator [facility]  Instruction Review Code 1- Verbalizes Understanding       Education: Flexibility, Balance, Mind/Body Relaxation: - Group verbal and visual presentation with interactive activity on the components of exercise prescription. Introduces F.I.T.T principle from ACSM for exercise prescriptions. Reviews F.I.T.T. principles of flexibility and balance exercise training including progression. Also discusses the mind body connection.  Reviews various relaxation techniques to help reduce and manage stress (i.e. Deep breathing, progressive muscle relaxation, and visualization). Balance handout provided to take home. Written material given at graduation. Flowsheet Row Cardiac Rehab from 10/07/2021 in [facility] Cardiac and Pulmonary Rehab  Date 08/26/21  Educator AS  Instruction Review Code 1- Verbalizes Understanding       Activity Barriers & Risk Stratification:  Activity Barriers & Cardiac Risk Stratification - 08/19/21 1435       Activity Barriers & Cardiac Risk Stratification   Activity Barriers Balance Concerns;Muscular  Weakness;Deconditioning;Chest Pain/Angina;History of Falls;Assistive Device;Back Problems    Comments uses walker on occasion, neuropathy, arm ROM restriction due to new PPM    Cardiac Risk Stratification High             6 Minute Walk:   Oxygen Initial Assessment:   Oxygen Re-Evaluation:   Oxygen Discharge (Final Oxygen Re-Evaluation):   Initial Exercise Prescription:  Initial Exercise Prescription - 08/19/21 1400       Date of Initial Exercise  RX and Referring Provider   Date 08/19/21    Referring Provider Cloretta Ned MD      Oxygen   Maintain Oxygen Saturation 88% or higher      Recumbant Bike   Level 1    RPM 60    Minutes 15    METs 1.7      NuStep   Level 1    SPM 80    Minutes 15    METs 1.7      Biostep-RELP   Level 1    SPM 50    Minutes 15    METs 1.7      Track   Laps 5   as tolerated   Minutes 15    METs 1.2      Prescription Details   Frequency (times per week) 2    Duration Progress to 30 minutes of continuous aerobic without signs/symptoms of physical distress      Intensity   THRR 40-80% of Max Heartrate 104-136    Ratings of Perceived Exertion 11-15    Perceived Dyspnea 0-4      Resistance Training   Training Prescription Yes    Weight 3 lb   ROM on left   Reps 10-15             Perform Capillary Blood Glucose checks as needed.  Exercise Prescription Changes:   Exercise Prescription Changes     Row Name 08/19/21 1400 08/30/21 1400 09/13/21 1000 09/28/21 1200 10/13/21 1200     Response to Exercise   Blood Pressure (Admit) 136/70 126/72 140/68 128/64 118/62   Blood Pressure (Exercise) 148/76 126/64 -- 138/70 --   Blood Pressure (Exit) 128/76 120/70 124/69 122/62 124/66   Heart Rate (Admit) 73 bpm 130 bpm 76 bpm 74 bpm 59 bpm   Heart Rate (Exercise) 107 bpm 130 bpm 90 bpm 128 bpm 120 bpm   Heart Rate (Exit) 74 bpm 84 bpm 75 bpm 77 bpm 68 bpm   Oxygen Saturation (Admit) 99 % -- -- -- --   Oxygen Saturation  (Exercise) 100 % -- -- -- --   Oxygen Saturation (Exit) 98 % -- -- -- --   Rating of Perceived Exertion (Exercise) '15 15 11 12 12   ' Perceived Dyspnea (Exercise) 1 -- -- -- --   Symptoms chest tightness 2/10, fatigue -- -- -- chest pain   Comments walk test results -- -- -- --   Duration Continue with 30 min of aerobic exercise without signs/symptoms of physical distress. Continue with 30 min of aerobic exercise without signs/symptoms of physical distress. Continue with 30 min of aerobic exercise without signs/symptoms of physical distress. Continue with 30 min of aerobic exercise without signs/symptoms of physical distress. Continue with 30 min of aerobic exercise without signs/symptoms of physical distress.   Intensity THRR unchanged THRR unchanged THRR unchanged THRR unchanged THRR unchanged     Progression   Progression -- Continue to progress workloads to maintain intensity without signs/symptoms of physical distress. Continue to progress workloads to maintain intensity without signs/symptoms of physical distress. Continue to progress workloads to maintain intensity without signs/symptoms of physical distress. Continue to progress workloads to maintain intensity without signs/symptoms of physical distress.   Average METs -- 2 2 1.4 1.35     Resistance Training   Training Prescription -- Yes Yes Yes Yes   Weight -- 3 lb 3 lb 3 lb 3 lb   Reps -- 10-15 10-15 10-15 10-15     Interval  Training   Interval Training -- No No No No     NuStep   Level -- '3 3 3 3   ' Minutes -- '15 15 15 30   ' METs -- 2 -- 1.6 1.7     REL-XR   Level -- -- 1 -- 1   Minutes -- -- 15 -- 30   METs -- -- -- -- 1     Biostep-RELP   Level -- 1 -- 1 --   Minutes -- 15 -- 15 --   METs -- 2 -- 1.2 --     Oxygen   Maintain Oxygen Saturation -- -- -- -- 88% or higher            Exercise Comments:   Exercise Comments     Row Name 08/26/21 1107           Exercise Comments First full day of exercise!   Patient was oriented to gym and equipment including functions, settings, policies, and procedures.  Patient's individual exercise prescription and treatment plan were reviewed.  All starting workloads were established based on the results of the 6 minute walk test done at initial orientation visit.  The plan for exercise progression was also introduced and progression will be customized based on patient's performance and goals.                Exercise Goals and Review:   Exercise Goals     Row Name 08/19/21 1500             Exercise Goals   Increase Physical Activity Yes       Intervention Provide advice, education, support and counseling about physical activity/exercise needs.;Develop an individualized exercise prescription for aerobic and resistive training based on initial evaluation findings, risk stratification, comorbidities and participant's personal goals.       Expected Outcomes Short Term: Attend rehab on a regular basis to increase amount of physical activity.;Long Term: Add in home exercise to make exercise part of routine and to increase amount of physical activity.;Long Term: Exercising regularly at least 3-5 days a week.       Increase Strength and Stamina Yes       Intervention Provide advice, education, support and counseling about physical activity/exercise needs.;Develop an individualized exercise prescription for aerobic and resistive training based on initial evaluation findings, risk stratification, comorbidities and participant's personal goals.       Expected Outcomes Short Term: Increase workloads from initial exercise prescription for resistance, speed, and METs.;Short Term: Perform resistance training exercises routinely during rehab and add in resistance training at home;Long Term: Improve cardiorespiratory fitness, muscular endurance and strength as measured by increased METs and functional capacity (6MWT)       Able to understand and use rate of perceived  exertion (RPE) scale Yes       Intervention Provide education and explanation on how to use RPE scale       Expected Outcomes Short Term: Able to use RPE daily in rehab to express subjective intensity level;Long Term:  Able to use RPE to guide intensity level when exercising independently       Able to understand and use Dyspnea scale Yes       Intervention Provide education and explanation on how to use Dyspnea scale       Expected Outcomes Short Term: Able to use Dyspnea scale daily in rehab to express subjective sense of shortness of breath during exertion;Long Term: Able to use Dyspnea scale to guide  intensity level when exercising independently       Knowledge and understanding of Target Heart Rate Range (THRR) Yes       Intervention Provide education and explanation of THRR including how the numbers were predicted and where they are located for reference       Expected Outcomes Short Term: Able to state/look up THRR;Short Term: Able to use daily as guideline for intensity in rehab;Long Term: Able to use THRR to govern intensity when exercising independently       Able to check pulse independently Yes       Intervention Provide education and demonstration on how to check pulse in carotid and radial arteries.;Review the importance of being able to check your own pulse for safety during independent exercise       Expected Outcomes Short Term: Able to explain why pulse checking is important during independent exercise;Long Term: Able to check pulse independently and accurately       Understanding of Exercise Prescription Yes       Intervention Provide education, explanation, and written materials on patient's individual exercise prescription       Expected Outcomes Short Term: Able to explain program exercise prescription;Long Term: Able to explain home exercise prescription to exercise independently                Exercise Goals Re-Evaluation :  Exercise Goals Re-Evaluation     Row Name  08/26/21 1108 08/30/21 1422 09/13/21 1023 09/28/21 1114 10/13/21 1216     Exercise Goal Re-Evaluation   Exercise Goals Review Able to understand and use rate of perceived exertion (RPE) scale;Knowledge and understanding of Target Heart Rate Range (THRR);Understanding of Exercise Prescription;Increase Physical Activity;Increase Strength and Stamina;Able to understand and use Dyspnea scale;Able to check pulse independently Increase Physical Activity;Increase Strength and Stamina Increase Physical Activity;Increase Strength and Stamina Increase Physical Activity;Increase Strength and Stamina;Able to understand and use rate of perceived exertion (RPE) scale;Able to understand and use Dyspnea scale;Knowledge and understanding of Target Heart Rate Range (THRR);Able to check pulse independently;Understanding of Exercise Prescription Increase Physical Activity;Increase Strength and Stamina;Understanding of Exercise Prescription   Comments Reviewed RPE and dyspnea scales, THR and program prescription with pt today.  Pt voiced understanding and was given a copy of goals to take home. Today was Garys second session. he workes at RPE 14-15.  He reached THR range.  We will monitor progress. Brainard has been having trouble with getting his blood sugars up before exercise and therefore has not been able to complete a full exercise session due to limited time. He is on T4 Nustep for level 3 and is tolerating his current exercise prescription. Will continue to monitor and hope he can manage his blood sugars better with exercise. Reviewed home exercise with pt today.  Pt plans to walk and use staff videos at home for exercise.  Reviewed THR, pulse, RPE, sign and symptoms, pulse oximetery and when to call 911 or MD.  Also discussed weather considerations and indoor options.  Pt voiced understanding. He will also keep close eye on his sugars. Alexy is doing well in rehab.  He was up to level 3 on the NuStep, but last week was having  some chest pain, so he dropped back to level 1.  He has tried out the XR and finds it challenging.  We will conitnue to monitor his progress.   Expected Outcomes Short: Use RPE daily to regulate intensity. Long: Follow program prescription in THR. Short:  attend consistently  Long:  improve overall stamina Short: Manage blood glucose better prior to exercise Long: Gradually increase overall MET level Short: Start to add  in more exercise at home Long: Continue to move more. Short: Work back up to level 3 on NuStep.  Long: conitnue to improve stamina            Discharge Exercise Prescription (Final Exercise Prescription Changes):  Exercise Prescription Changes - 10/13/21 1200       Response to Exercise   Blood Pressure (Admit) 118/62    Blood Pressure (Exit) 124/66    Heart Rate (Admit) 59 bpm    Heart Rate (Exercise) 120 bpm    Heart Rate (Exit) 68 bpm    Rating of Perceived Exertion (Exercise) 12    Symptoms chest pain    Duration Continue with 30 min of aerobic exercise without signs/symptoms of physical distress.    Intensity THRR unchanged      Progression   Progression Continue to progress workloads to maintain intensity without signs/symptoms of physical distress.    Average METs 1.35      Resistance Training   Training Prescription Yes    Weight 3 lb    Reps 10-15      Interval Training   Interval Training No      NuStep   Level 3    Minutes 30    METs 1.7      REL-XR   Level 1    Minutes 30    METs 1      Oxygen   Maintain Oxygen Saturation 88% or higher             Nutrition:  Target Goals: Understanding of nutrition guidelines, daily intake of sodium <1538m, cholesterol <2027m calories 30% from fat and 7% or less from saturated fats, daily to have 5 or more servings of fruits and vegetables.  Education: All About Nutrition: -Group instruction provided by verbal, written material, interactive activities, discussions, models, and posters to present  general guidelines for heart healthy nutrition including fat, fiber, MyPlate, the role of sodium in heart healthy nutrition, utilization of the nutrition label, and utilization of this knowledge for meal planning. Follow up email sent as well. Written material given at graduation. [facility]  Date 09/02/21  [facility] Code 1- Verbalizes Understanding       Biometrics:  Pre Biometrics - 08/19/21 1436       Pre Biometrics   Height 5' 6.5" (1.689 m)    Weight 193 lb 4.8 oz (87.7 kg)    BMI (Calculated) 30.74              Nutrition Therapy Plan and Nutrition Goals:  Nutrition Therapy & Goals - 09/08/21 1331       Nutrition Therapy   Diet Heart healthy, low Na, diabetes friendly    Drug/Food Interactions Statins/Certain Fruits    Protein (specify units) 70g    Fiber 25 grams    Whole Grain Foods 3 servings    Saturated Fats 12 max. grams    Fruits and Vegetables 5 servings/day    Sodium 1.5 grams      Personal Nutrition Goals   Nutrition Goal ST: pair carbohydrates with fiber, fat, and protein. Eat 2-4 CHO per meal. LT: maintain heart healthy changes, limit BG lows    Comments T2DM controlled by diet. BG has been 85-90 1st thing in the morning and in  the day 125-130. He reports his wife said he is not eating as much as he used to. He is concerned about his weight - he feels like his weight is going down unintentionally. Wife makes cookies with flaxseeds (1 per day throughout the day). B: cereal (multigrain cheerios with almond milk with blueberries, strawberries, or banana) or eggs with 1 slice of whole wheat toast at home or at the grill. He does not eat bacon at all anymore and he will eat sausage rarely. OJ with lemonade. L: leftovers or sandiwch with whole wheat bread ([place] with lite mayo and lettuce and tomato) or going out to eat (grilled food like burgers at mom and pop place and  some vegetables) D: grilled chicken, grilled fish with a salad or another vegetable (he eats a variety), when going out to eat will get baked potato and eat half. He loves sweet potatoes and zucchini. Tomato based foods can give him reflux. Drinks: pepsi zero (2: 16 oz bottle). He used to drink liquor, but has stopped. He goes out to eat 3-4x/week and will have leftovers from that. Reviewed heart healthy eating, how eating at home can help to limit Na, increase whole grains, increase variety, and allows use of healthy oils/fats such as olive oil which he mentioned is all they use at home. Discussed diabetes friendly eating.      Intervention Plan   Intervention Prescribe, educate and counsel regarding individualized specific dietary modifications aiming towards targeted core components such as weight, hypertension, lipid management, diabetes, heart failure and other comorbidities.    Expected Outcomes Short Term Goal: Understand basic principles of dietary content, such as calories, fat, sodium, cholesterol and nutrients.;Short Term Goal: A plan has been developed with personal nutrition goals set during dietitian appointment.;Long Term Goal: Adherence to prescribed nutrition plan.             Nutrition Assessments:  MEDIFICTS Score Key: ?70 Need to make dietary changes  40-70 Heart Healthy Diet ? 40 Therapeutic Level Cholesterol Diet  Flowsheet Row Cardiac Rehab from 08/19/2021 in [facility] Cardiac and Pulmonary Rehab  Picture Your Plate Total Score on Admission 76      Picture Your Plate Scores: <96 Unhealthy dietary pattern with much room for improvement. 41-50 Dietary pattern unlikely to meet recommendations for good health and room for improvement. 51-60 More healthful dietary pattern, with some room for improvement.  >60 Healthy dietary pattern, although there may be some specific behaviors that could be improved.    Nutrition Goals Re-Evaluation:  Nutrition Goals Re-Evaluation      [place] Name 09/28/21 1118             Goals   Nutrition Goal ST: pair carbohydrates with fiber, fat, and protein. Eat 2-4 CHO per meal. LT: maintain heart healthy changes, limit BG lows       Comment Troi is doing well in rehab.  He is keeping his weight down and sticks to a healthy diet for the most part.  He has been lots of leftovers from Thanksgiving.  He has been eating lots of eggs and enjoys them.  He has cut out the bacon as it was causing him to hurt.  He is getting lots of vegetables       Expected Outcome Short: Continue to focus on healthy eating. Long: Continue to montior salt levels                Nutrition Goals Discharge (Final Nutrition Goals Re-Evaluation):  Nutrition Goals  Re-Evaluation - 09/28/21 1118       Goals   Nutrition Goal ST: pair carbohydrates with fiber, fat, and protein. Eat 2-4 CHO per meal. LT: maintain heart healthy changes, limit BG lows    Comment Flemon is doing well in rehab.  He is keeping his weight down and sticks to a healthy diet for the most part.  He has been lots of leftovers from Thanksgiving.  He has been eating lots of eggs and enjoys them.  He has cut out the bacon as it was causing him to hurt.  He is getting lots of vegetables    Expected Outcome Short: Continue to focus on healthy eating. Long: Continue to montior salt levels             Psychosocial: Target Goals: Acknowledge presence or absence of significant depression and/or stress, maximize coping skills, provide positive support system. Participant is able to verbalize types and ability to use techniques and skills needed for reducing stress and depression.   Education: Stress, Anxiety, and Depression - Group verbal and visual presentation to define topics covered.  Reviews how body is impacted by stress, anxiety, and depression.  Also discusses healthy ways to reduce stress and to treat/manage anxiety and depression.  Written material given at graduation. Flowsheet Row  Cardiac Rehab from 10/07/2021 in [facility] Cardiac and Pulmonary Rehab  Date 09/30/21  Educator [facility]  Instruction Review Code 1- Verbalizes Understanding       Education: Sleep Hygiene -Provides group verbal and written instruction about how sleep can affect your health.  Define sleep hygiene, discuss sleep cycles and impact of sleep habits. Review good sleep hygiene tips.    Initial Review & Psychosocial Screening:  Initial Psych Review & Screening - 08/02/21 1544       Initial Review   Current issues with Current Stress Concerns;Current Sleep Concerns    Source of Stress Concerns Chronic Illness      Family Dynamics   Good Support System? Yes   wife     Barriers   Psychosocial barriers to participate in program There are no identifiable barriers or psychosocial needs.;The patient should benefit from training in stress management and relaxation.      Screening Interventions   Interventions Encouraged to exercise;Provide feedback about the scores to participant;To provide support and resources with identified psychosocial needs    Expected Outcomes Short Term goal: Utilizing psychosocial counselor, staff and physician to assist with identification of specific Stressors or current issues interfering with healing process. Setting desired goal for each stressor or current issue identified.;Long Term Goal: Stressors or current issues are controlled or eliminated.;Short Term goal: Identification and review with participant of any Quality of Life or Depression concerns found by scoring the questionnaire.;Long Term goal: The participant improves quality of Life and PHQ9 Scores as seen by post scores and/or verbalization of changes             Quality of Life Scores:   Quality of Life - 08/19/21 1434       Quality of Life   Select Quality of Life      Quality of Life Scores   Health/Function Pre 17.13 %    Socioeconomic Pre 28.29 %    Psych/Spiritual Pre 22.29 %    Family Pre 27.6 %     GLOBAL Pre 22.03 %            Scores of 19 and below usually indicate a poorer quality of life in these areas.  A difference of  2-3 points is a clinically meaningful difference.  A difference of 2-3 points in the total score of the Quality of Life Index has been associated with significant improvement in overall quality of life, self-image, physical symptoms, and general health in studies assessing change in quality of life.  PHQ-9: Recent Review Flowsheet Data     Depression screen [facility] 2/9 08/19/2021 10/01/2019 07/02/2019 05/31/2019   Decreased Interest 0 0 0 0   Down, Depressed, Hopeless 0 0 0 1   PHQ - 2 Score 0 0 0 1   Altered sleeping 1 - 1 3   Tired, decreased energy '2 1 1 3   ' Change in appetite 0 0 0 0   Feeling bad or failure about yourself  0 1 0 0   Trouble concentrating 0 0 0 0   Moving slowly or fidgety/restless '1 3 1 ' 0   Suicidal thoughts 0 0 0 0   PHQ-9 Score 4 - 3 7   Difficult doing work/chores Somewhat difficult Somewhat difficult Not difficult at all Somewhat difficult      Interpretation of Total Score  Total Score Depression Severity:  1-4 = Minimal depression, 5-9 = Mild depression, 10-14 = Moderate depression, 15-19 = Moderately severe depression, 20-27 = Severe depression   Psychosocial Evaluation and Intervention:  Psychosocial Evaluation - 08/02/21 1608       Psychosocial Evaluation & Interventions   Interventions Encouraged to exercise with the program and follow exercise prescription;Stress management education;Relaxation education    Comments Nyjah is 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 He stated he felt great after graduation and wants to get back to that feeling. He does have balance concerns related to his neuropathy and  uses a walker sometimes so that hinders his exercise at times. His wife is very supportive and manages his health care. They both want him to have increased stamina and is hopeful this program will help him heal. He does state that he has had night terrors that have woken him up with chest pain and requiring a nitro. His MD is aware and has encouraged him to take a Klonopin nightly which has seemed to help some.    Expected Outcomes Short: attend cardiac rehab for education and exercise. Long: develop and maintain positive self care habits.    Continue Psychosocial Services  Follow up required by staff             Psychosocial Re-Evaluation:  Psychosocial Re-Evaluation     [place] Name 09/28/21 1115             Psychosocial Re-Evaluation   Current issues with Current Stress Concerns       Comments Kemper is doing well in rehab.  He tries to deal with is stressors from time to time.  They have been tackling Christmas shopping and get frustrated with traffic and people.  He choose to eat and then just sit in the car to relax and not have to deal with the people. He is sleeping good and uses his klonopin at night but it helps him sleep and keeps his vivid dreams to a minimum.  He does have issues with his blood sugars and breathing which can get frustrating, but he  does the best he can with it.       Expected Outcomes Short: Continue to exerise for mental boost Long: Conitnue to stay positive       Interventions Encouraged to attend Cardiac Rehabilitation for the exercise       Continue Psychosocial Services  Follow up required by staff                Psychosocial Discharge (Final Psychosocial Re-Evaluation):  Psychosocial Re-Evaluation - 09/28/21 1115       Psychosocial Re-Evaluation   Current issues with Current Stress Concerns    Comments Ariq is doing well in rehab.  He tries to deal with is stressors from time to time.  They have been tackling Christmas shopping and get frustrated  with traffic and people.  He choose to eat and then just sit in the car to relax and not have to deal with the people. He is sleeping good and uses his klonopin at night but it helps him sleep and keeps his vivid dreams to a minimum.  He does have issues with his blood sugars and breathing which can get frustrating, but he does the best he can with it.    Expected Outcomes Short: Continue to exerise for mental boost Long: Conitnue to stay positive    Interventions Encouraged to attend Cardiac Rehabilitation for the exercise    Continue Psychosocial Services  Follow up required by staff             Vocational Rehabilitation: Provide vocational rehab assistance to qualifying candidates.   Vocational Rehab Evaluation & Intervention:  Vocational Rehab - 08/02/21 1544       Initial Vocational Rehab Evaluation & Intervention   Assessment shows need for Vocational Rehabilitation No             Education: Education Goals: Education classes will be provided on a variety of topics geared toward better understanding of heart health and risk factor modification. Participant will state understanding/return demonstration of topics presented as noted by education test scores.  Learning Barriers/Preferences:  Learning Barriers/Preferences - 08/02/21 1541       Learning Barriers/Preferences   Learning Barriers None    Learning Preferences None             General Cardiac Education Topics:  AED/CPR: - Group verbal and written instruction with the use of models to demonstrate the basic use of the AED with the basic ABC's of resuscitation.   Anatomy and Cardiac Procedures: - Group verbal and visual presentation and models provide information about basic cardiac anatomy and function. Reviews the testing methods done to diagnose heart disease and the outcomes of the test results. Describes the treatment choices: Medical Management, Angioplasty, or Coronary Bypass Surgery for treating  various heart conditions including Myocardial Infarction, Angina, Valve Disease, and Cardiac Arrhythmias.  Written material given at graduation.   Medication Safety: - Group verbal and visual instruction to review commonly prescribed medications for heart and lung disease. Reviews the medication, class of the drug, and side effects. Includes the steps to properly store meds and maintain the prescription regimen.  Written material given at graduation.   Intimacy: - Group verbal instruction through game format to discuss how heart and lung disease can affect sexual intimacy. Written material given at graduation..   Know Your Numbers and Heart Failure: - Group verbal and visual instruction to discuss disease risk factors for cardiac and pulmonary disease and treatment options.  Reviews associated critical values for Overweight/Obesity,  Hypertension, Cholesterol, and Diabetes.  Discusses basics of heart failure: signs/symptoms and treatments.  Introduces Heart Failure Zone chart for action plan for heart failure.  Written material given at graduation. Flowsheet Row Cardiac Rehab from 10/07/2021 in [facility] Cardiac and Pulmonary Rehab  Date 09/16/21  Educator SB  Instruction Review Code 1- Verbalizes Understanding       Infection Prevention: - Provides verbal and written material to individual with discussion of infection control including proper hand washing and proper equipment cleaning during exercise session. Flowsheet Row Cardiac Rehab from 10/07/2021 in [facility] Cardiac and Pulmonary Rehab  Date 08/19/21  Educator [place]  Instruction Review Code 1- Verbalizes Understanding       Falls Prevention: - Provides verbal and written material to individual with discussion of falls prevention and safety. Flowsheet Row Cardiac Rehab from 10/07/2021 in [facility] Cardiac and Pulmonary Rehab  Date 08/19/21  Educator [place]  Instruction Review Code 2- Demonstrated Understanding       Other: -Provides group  and verbal instruction on various topics (see comments)   Knowledge Questionnaire Score:  Knowledge Questionnaire Score - 08/19/21 1434       Knowledge Questionnaire Score   Pre Score 25/26: Exercise             Core Components/Risk Factors/Patient Goals at Admission:  Personal Goals and Risk Factors at Admission - 08/19/21 1502       Core Components/Risk Factors/Patient Goals on Admission    Weight Management Yes;Weight Loss    Intervention Weight Management: Develop a combined nutrition and exercise program designed to reach desired caloric intake, while maintaining appropriate intake of nutrient and fiber, sodium and fats, and appropriate energy expenditure required for the weight goal.;Weight Management: Provide education and appropriate resources to help participant work on and attain dietary goals.;Weight Management/Obesity: Establish reasonable short term and long term weight goals.    Admit Weight 193 lb (87.5 kg)    Goal Weight: Short Term 188 lb (85.3 kg)    Goal Weight: Long Term 180 lb (81.6 kg)    Expected Outcomes Short Term: Continue to assess and modify interventions until short term weight is achieved;Long Term: Adherence to nutrition and physical activity/exercise program aimed toward attainment of established weight goal;Weight Loss: Understanding of general recommendations for a balanced deficit meal plan, which promotes 1-2 lb weight loss per week and includes a negative energy balance of [phone] kcal/d;Understanding recommendations for meals to include 15-35% energy as protein, 25-35% energy from fat, 35-60% energy from carbohydrates, less than 285m of dietary cholesterol, 20-35 gm of total fiber daily;Understanding of distribution of calorie intake throughout the day with the consumption of 4-5 meals/snacks    Diabetes Yes    Intervention Provide education about signs/symptoms and action to take for hypo/hyperglycemia.;Provide education about proper nutrition,  including hydration, and aerobic/resistive exercise prescription along with prescribed medications to achieve blood glucose in normal ranges: Fasting glucose 65-99 mg/dL    Expected Outcomes Short Term: Participant verbalizes understanding of the signs/symptoms and immediate care of hyper/hypoglycemia, proper foot care and importance of medication, aerobic/resistive exercise and nutrition plan for blood glucose control.;Long Term: Attainment of HbA1C < 7%.    Hypertension Yes    Intervention Provide education on lifestyle modifcations including regular physical activity/exercise, weight management, moderate sodium restriction and increased consumption of fresh fruit, vegetables, and low fat dairy, alcohol moderation, and smoking cessation.;Monitor prescription use compliance.    Expected Outcomes Short Term: Continued assessment and intervention until BP is < 140/973mHG in hypertensive  participants. < 130/50m HG in hypertensive participants with diabetes, heart failure or chronic kidney disease.;Long Term: Maintenance of blood pressure at goal levels.    Lipids Yes    Intervention Provide education and support for participant on nutrition & aerobic/resistive exercise along with prescribed medications to achieve LDL <757m HDL >4029m   Expected Outcomes Short Term: Participant states understanding of desired cholesterol values and is compliant with medications prescribed. Participant is following exercise prescription and nutrition guidelines.;Long Term: Cholesterol controlled with medications as prescribed, with individualized exercise RX and with personalized nutrition plan. Value goals: LDL < 67m36mDL > 40 mg.             Education:Diabetes - Individual verbal and written instruction to review signs/symptoms of diabetes, desired ranges of glucose level fasting, after meals and with exercise. Acknowledge that pre and post exercise glucose checks will be done for 3 sessions at entry of  program. [place] [facility]  Education need identified 08/19/21  Date 08/19/21  Educator [place]  [place] Review Code 1- Verbalizes Understanding       Core Components/Risk Factors/Patient Goals Review:   Goals and Risk Factor Review     Row Name 09/28/21 1120             Core Components/Risk Factors/Patient Goals Review   Personal Goals Review Weight Management/Obesity;Hypertension;Diabetes       Review GaryFerrelldoing well in rehab. His weight has snuck up over Thanksgiving, but it is coming back down again.  His pressures are doing well and he keeps an eye on it at home.  His sugars have been up and down in class and at home.  He has stopped his glipizide and that seems to help, but still having some issues.  He will continue to keep a close eye on his sugars.       Expected Outcomes Short: Continue to work on blood sugar control Long: Continue to improve diabetes management                Core Components/Risk Factors/Patient Goals at Discharge (Final Review):   Goals and Risk Factor Review - 09/28/21 1120       Core Components/Risk Factors/Patient Goals Review   Personal Goals Review Weight Management/Obesity;Hypertension;Diabetes    Review GaryKyndaldoing well in rehab. His weight has snuck up over Thanksgiving, but it is coming back down again.  His pressures are doing well and he keeps an eye on it at home.  His sugars have been up and down in class and at home.  He has stopped his glipizide and that seems to help, but still having some issues.  He will continue to keep a close eye on his sugars.    Expected Outcomes Short: Continue to work on blood sugar control Long: Continue to improve diabetes management             ITP Comments:  ITP Comments     Row Name 08/02/21 1534 08/19/21 1410 08/19/21 1411 08/24/21 1117 08/26/21 1107   ITP Comments Initial telephone orientation completed. [facility] found  in [facility]. EP orientation scheduled for Thursday 11/3 at 10am. Completed 6MWT and gym orientation. Initial ITP created and sent for review to Dr. MarkEmily Filbertdical Director. EKG strips sent to Dr. BlazEdwin Dadaoke with Dr. BlazEdwin Dadathe phone, per MD- Patient strips are OK, he is pacing. No concern with anything else, patient OK to  start exercise. First full day of exercise!  Patient was oriented to gym and equipment including functions, settings, policies, and procedures.  Patient's individual exercise prescription and treatment plan were reviewed.  All starting workloads were established based on the results of the 6 minute walk test done at initial orientation visit.  The plan for exercise progression was also introduced and progression will be customized based on patient's performance and goals.    Row Name 09/08/21 1430 09/15/21 0624 10/13/21 0644 10/20/21 1301 11/04/21 1416   ITP Comments Completed initial RD consultation 30 Day review completed. Medical Director ITP review done, changes made as directed, and signed approval by Medical Director. 30 Day review completed. Medical Director ITP review done, changes made as directed, and signed approval by Medical Director. Patient called to let us know that his doctor wants him to stop rehab and start PT for his back. Staff will place patient on medical hold until we hear back on how long he may need PT for. If PT lasts a longer time, may possibly need to discharge and get new referral  when appropriate and cleared. Patient will call and keep [place] updated when he starts PT. Patient in hospital for PAD. Will attempt to follow up with patient after to see how long he needs PT for and if we need to keep patient on medical hold or discharge.    [place] Name 11/10/21 0817           ITP Comments 30 Day review completed. Medical Director ITP review done, changes made as directed, and signed approval by Medical Director.   No visit this month medical reason                 Comments:

## 2021-11-12 ENCOUNTER — Telehealth (INDEPENDENT_AMBULATORY_CARE_PROVIDER_SITE_OTHER): Payer: Self-pay

## 2021-11-12 NOTE — Telephone Encounter (Signed)
Patient's wife called and stated Tramadol 50mg  1 tablet by mouth every 6 hours  is not working and wanted to know what to do next, if he should go all weekend with this pain.  She asked to be giving a call back at [phone] or [phone].

## 2021-11-12 NOTE — Telephone Encounter (Signed)
Pt called and left a VM on the nurses line saying he has been taking tramodol for the last week for pain from a bypass he received on 11/04/21 per the pt he was told if he is still in pain he needs to be seen. Bring pt in for office visit as schedule alots Possibly. Please advise.

## 2021-11-12 NOTE — Telephone Encounter (Signed)
Patient's wife called and stated Tramadol 50mg  1 tablet by mouth every 6 hours  is not working and wanted to know what to do next, if he should go all weekend with this pain.  She asked to be giving a call back at [phone] or [phone].

## 2021-11-12 NOTE — Telephone Encounter (Signed)
We can bring in with ABIs

## 2021-11-15 ENCOUNTER — Ambulatory Visit: Payer: BC Managed Care – PPO | Admitting: Physical Therapy

## 2021-11-16 NOTE — Telephone Encounter (Signed)
Please schedule for ABI an OFC visit.

## 2021-11-18 ENCOUNTER — Telehealth: Payer: Self-pay

## 2021-11-18 ENCOUNTER — Ambulatory Visit (INDEPENDENT_AMBULATORY_CARE_PROVIDER_SITE_OTHER): Payer: BC Managed Care – PPO

## 2021-11-18 ENCOUNTER — Ambulatory Visit: Payer: BC Managed Care – PPO | Attending: Physical Medicine and Rehabilitation | Admitting: Physical Therapy

## 2021-11-18 ENCOUNTER — Ambulatory Visit (INDEPENDENT_AMBULATORY_CARE_PROVIDER_SITE_OTHER): Payer: BC Managed Care – PPO | Admitting: Nurse Practitioner

## 2021-11-18 ENCOUNTER — Encounter (INDEPENDENT_AMBULATORY_CARE_PROVIDER_SITE_OTHER): Payer: Self-pay | Admitting: Nurse Practitioner

## 2021-11-18 ENCOUNTER — Encounter: Payer: BC Managed Care – PPO | Attending: Cardiovascular Disease

## 2021-11-18 ENCOUNTER — Other Ambulatory Visit: Payer: Self-pay

## 2021-11-18 VITALS — BP 135/74 | HR 82 | Ht 67.0 in | Wt 185.0 lb

## 2021-11-18 DIAGNOSIS — Z9861 Coronary angioplasty status: Secondary | ICD-10-CM | POA: Insufficient documentation

## 2021-11-18 DIAGNOSIS — I252 Old myocardial infarction: Secondary | ICD-10-CM | POA: Insufficient documentation

## 2021-11-18 DIAGNOSIS — I10 Essential (primary) hypertension: Secondary | ICD-10-CM

## 2021-11-18 DIAGNOSIS — I70222 Atherosclerosis of native arteries of extremities with rest pain, left leg: Secondary | ICD-10-CM

## 2021-11-18 DIAGNOSIS — I739 Peripheral vascular disease, unspecified: Secondary | ICD-10-CM | POA: Diagnosis not present

## 2021-11-18 DIAGNOSIS — Z9889 Other specified postprocedural states: Secondary | ICD-10-CM | POA: Diagnosis not present

## 2021-11-18 DIAGNOSIS — E785 Hyperlipidemia, unspecified: Secondary | ICD-10-CM | POA: Diagnosis not present

## 2021-11-18 NOTE — Telephone Encounter (Signed)
Attempted to follow up from patient regarding Cardiac Rehab. Left message.

## 2021-11-20 ENCOUNTER — Encounter (INDEPENDENT_AMBULATORY_CARE_PROVIDER_SITE_OTHER): Payer: Self-pay | Admitting: Nurse Practitioner

## 2021-11-20 NOTE — Progress Notes (Signed)
Subjective:    Patient ID: Damon English, male    DOB: Mar 11, 1951, 71 y.o.   MRN: [id number] Chief Complaint  Patient presents with   Follow-up    U/S follow up    Damon English is a 71 year old male that returns to the office for followup and review status post angiogram with intervention. The patient notes improvement in the lower extremity symptoms. No interval shortening of the patient's claudication distance or rest pain symptoms. Previous wounds have now healed.  No new ulcers or wounds have occurred since the last visit.  There have been no significant changes to the patient's overall health care.  The patient denies amaurosis fugax or recent TIA symptoms. There are no recent neurological changes noted. The patient denies history of DVT, PE or superficial thrombophlebitis. The patient denies recent episodes of angina or shortness of breath.   ABI's Rt=1.17 and Lt=1.16  (previous ABI's Rt=1.19 and Lt=0.62) Duplex US of the bilateral lower extremity showed triphasic waveforms with good toe waveforms bilaterally.   Review of Systems     Objective:   Physical Exam  BP 135/74    Pulse 82    Ht 5\' 7"  (1.702 m)    Wt 185 lb (83.9 kg)    BMI 28.98 kg/m   Past Medical History:  Diagnosis Date   Alcohol use    "cutting back" but still heavy and daily, multiple shots of bourbon each night   Diabetes mellitus without complication ([place])    History of tobacco use    reportedly quit in 2013   Hypertension    Pulmonary embolism ([place]) 2012   unprovoked   PVD (peripheral vascular disease) ([place])     Social History   Socioeconomic History   Marital status: Married    Spouse name: Not on file   Number of children: Not on file   Years of education: Not on file   Highest education level: Not on file  Occupational History   Not on file  Tobacco Use   Smoking status: Former    Packs/day: 2.00    Years: 40.00    Pack years: 80.00    Types: Cigarettes    Quit date:  11/27/2010    Years since quitting: 10.9   Smokeless tobacco: Never   Tobacco comments:    Quit in 2012  Vaping Use   Vaping Use: Never used  Substance and Sexual Activity   Alcohol use: Yes    Alcohol/week: 7.0 standard drinks    Types: 7 Shots of liquor per week    Comment: stopped alcohol 06/17/2021   Drug use: Never   Sexual activity: Not on file  Other Topics Concern   Not on file  Social History Narrative   Not on file   Social Determinants of Health   Financial Resource Strain: Not on file  Food Insecurity: Not on file  Transportation Needs: Not on file  Physical Activity: Not on file  Stress: Not on file  Social Connections: Not on file  Intimate Partner Violence: Not on file    Past Surgical History:  Procedure Laterality Date   BACK SURGERY     CHOLECYSTECTOMY     CORONARY ANGIOGRAPHY N/A 01/30/2019   Procedure: CORONARY ANGIOGRAPHY;  Surgeon: Corey Skains, MD;  Location: [facility];  Service: Cardiovascular;  Laterality: N/A;   ERCP W/ SPHICTEROTOMY  2015   FEMORAL ARTERY - POPLITEAL ARTERY BYPASS GRAFT Right 06/19/2018   LEFT HEART CATH  AND CORS/GRAFTS ANGIOGRAPHY N/A 06/25/2021   Procedure: LEFT HEART CATH AND CORS/GRAFTS ANGIOGRAPHY;  Surgeon: Andrez Grime, MD;  Location: [facility];  Service: Cardiovascular;  Laterality: N/A;   LOWER EXTREMITY ANGIOGRAPHY Left 01/14/2021   Procedure: LOWER EXTREMITY ANGIOGRAPHY;  Surgeon: Algernon Huxley, MD;  Location: [facility];  Service: Cardiovascular;  Laterality: Left;   LOWER EXTREMITY ANGIOGRAPHY Left 11/04/2021   Procedure: LOWER EXTREMITY ANGIOGRAPHY;  Surgeon: Algernon Huxley, MD;  Location: [facility];  Service: Cardiovascular;  Laterality: Left;   TEMPORARY PACEMAKER N/A 07/11/2021   Procedure: TEMPORARY PACEMAKER;  Surgeon: Wellington Hampshire, MD;  Location: [facility];  Service: Cardiovascular;  Laterality: N/A;   THROMBOENDARTERECTOMY Right 06/19/2018    TONSILLECTOMY     VASCULAR SURGERY      Family History  Problem Relation Age of Onset   Hypertension Mother    Parkinson's disease Mother    Heart attack Father    Vascular Disease Father    AAA (abdominal aortic aneurysm) Father    Alzheimer's disease Father     Allergies  Allergen Reactions   Amoxicillin Other (See Comments)    Abdominal cramps Other reaction(s): Other (See Comments) Abdominal cramps Tolerated ancef 7/16   Hydromorphone Hcl     Quick & severe nausea/vomiting  Other reaction(s): Vomiting Quick & severe nausea/vomiting    Aspirin     Other reaction(s): Other (See Comments) 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  Is on NOAC for now.  No ASA with it. Not allergic per patient.     CBC Latest Ref Rng & Units 07/11/2021 06/23/2021 06/22/2021  WBC 4.0 - 10.5 K/uL 9.9 6.0 6.2  Hemoglobin 13.0 - 17.0 g/dL 13.6 10.8(L) 11.1(L)  Hematocrit 39.0 - 52.0 % 41.1 32.4(L) 34.2(L)  Platelets 150 - 400 K/uL 158 163 162      CMP     Component Value Date/Time   NA 135 07/11/2021 1317   NA 136 11/08/2013 1530   K 4.2 07/11/2021 1317   K 3.4 (L) 11/08/2013 1530   CL 100 07/11/2021 1317   CL 102 11/08/2013 1530   CO2 21 (L) 07/11/2021 1317   CO2 27 11/08/2013 1530   GLUCOSE 139 (H) 07/11/2021 1317   GLUCOSE 150 (H) 11/08/2013 1530   BUN 30 (H) 11/04/2021 1026   BUN 16 11/08/2013 1530   CREATININE 1.53 (H) 11/04/2021 1026   CREATININE 1.07 11/08/2013 1530   CALCIUM 9.5 07/11/2021 1317   CALCIUM 9.3 11/08/2013 1530   PROT 7.4 07/11/2021 1317   ALBUMIN 3.8 07/11/2021 1317   AST 28 07/11/2021 1317   ALT 26 07/11/2021 1317   ALKPHOS 121 07/11/2021 1317   BILITOT 0.9 07/11/2021 1317   GFRNONAA 49 (L) 11/04/2021 1026   GFRNONAA >60 11/08/2013 1530   GFRAA >60 01/30/2019 0652   GFRAA >60 11/08/2013 1530     No results found.     Assessment & Plan:   1. Peripheral arterial disease with history of revascularization (HCC)  Recommend:  The patient has evidence of  atherosclerosis of the lower extremities with claudication.  The patient does not voice lifestyle limiting changes at this point in time.  Noninvasive studies do not suggest clinically significant change.  No invasive studies, angiography or surgery at this time The patient should continue walking and begin a more formal exercise program.  The patient should continue antiplatelet therapy and aggressive treatment of the lipid abnormalities  No changes in the patient's  medications at this time  The patient should continue wearing graduated compression socks 10-15 mmHg strength to control the mild edema.    Patient to follow-up in 3 months  2. Hyperlipidemia, unspecified hyperlipidemia type Continue statin as ordered and reviewed, no changes at this time   3. Primary hypertension Continue antihypertensive medications as already ordered, these medications have been reviewed and there are no changes at this time.    Current Outpatient Medications on File Prior to Visit  Medication Sig Dispense Refill   acetaminophen (TYLENOL) 325 MG tablet Take 2 tablets (650 mg total) by mouth every 6 (six) hours as needed for fever. 20 tablet 0   acidophilus (RISAQUAD) CAPS capsule Take 2 capsules by mouth 3 (three) times daily. 30 capsule 0   apixaban (ELIQUIS) 5 MG TABS tablet Take by mouth.     atorvastatin (LIPITOR) 80 MG tablet Take 80 mg by mouth daily.     brimonidine (ALPHAGAN) 0.2 % ophthalmic solution Place 1 drop into both eyes 2 (two) times daily. 5 mL 12   Calcipotriene-Betameth Diprop (ENSTILAR) 0.005-0.064 % FOAM      Chlorhexidine Gluconate Cloth 2 % PADS Apply 6 each topically daily. 1 each 0   clonazePAM (KLONOPIN) 0.25 MG disintegrating tablet Take 1-2 tablets (0.25-0.5 mg total) by mouth 3 (three) times daily as needed (anxiety/agitation). 20 tablet 0   clopidogrel (PLAVIX) 75 MG tablet Take 1 tablet (75 mg total) by mouth daily. 30 tablet 0   diclofenac Sodium (VOLTAREN) 1 % GEL  Apply 4 g topically 4 (four) times daily.     docusate sodium (COLACE) 100 MG capsule Take 1 capsule (100 mg total) by mouth 2 (two) times daily. 10 capsule 0   DULoxetine (CYMBALTA) 60 MG capsule Take 1 capsule (60 mg total) by mouth 2 (two) times daily. 20 capsule 0   esomeprazole (NEXIUM) 40 MG capsule Take 1 capsule by mouth daily.     famotidine (PEPCID) 40 MG tablet Take 1 tablet by mouth every morning.     ferrous sulfate 325 (65 FE) MG EC tablet Take 1 tablet by mouth daily with breakfast.     folic acid (FOLVITE) 1 MG tablet Take 1 tablet (1 mg total) by mouth daily. 30 tablet 0   furosemide (LASIX) 20 MG tablet Take 20 mg by mouth daily as needed (Edema (Or weight gain of >2lbs overnight or 5lbs in one week)).     glipiZIDE (GLUCOTROL) 10 MG tablet Take 5 mg by mouth daily before breakfast.     guaiFENesin (MUCINEX) 600 MG 12 hr tablet Take 1 tablet (600 mg total) by mouth 2 (two) times daily. 30 tablet 0   isosorbide mononitrate (IMDUR) 30 MG 24 hr tablet Take 1 tablet (30 mg total) by mouth daily. 30 tablet 0   ketoconazole (NIZORAL) 2 % shampoo Apply topically.     latanoprost (XALATAN) 0.005 % ophthalmic solution Place 1 drop into both eyes at bedtime. 2.5 mL 12   losartan (COZAAR) 25 MG tablet Take 25 mg by mouth daily.     Magnesium 250 MG TABS Take by mouth.     metoprolol succinate (TOPROL-XL) 25 MG 24 hr tablet Take 1 tablet by mouth at bedtime.     Multiple Vitamin (MULTIVITAMIN WITH MINERALS) TABS tablet Take 1 tablet by mouth daily. 30 tablet 0   nitroGLYCERIN (NITROSTAT) 0.4 MG SL tablet Place 1 tablet (0.4 mg total) under the tongue every 5 (five) minutes as needed for chest pain. 20 tablet  12   omeprazole (PRILOSEC) 40 MG capsule Take 40 mg by mouth daily.     ondansetron (ZOFRAN) 4 MG/2ML SOLN injection Inject 2 mLs (4 mg total) into the vein every 6 (six) hours as needed for nausea. 2 mL 0   phenol (CHLORASEPTIC) 1.4 % LIQD Use as directed 1 spray in the mouth or throat  as needed for throat irritation / pain. 10 mL 0   polyethylene glycol (MIRALAX / GLYCOLAX) 17 g packet Take 17 g by mouth daily as needed for mild constipation or moderate constipation. 14 each 0   potassium chloride SA (KLOR-CON) 20 MEQ tablet Take by mouth. 6 tablets     ranolazine (RANEXA) 500 MG 12 hr tablet Take 500 mg by mouth 2 (two) times daily.     SENNOSIDES PO Take by mouth.     spironolactone (ALDACTONE) 25 MG tablet Take 12.5 mg by mouth.     sucralfate (CARAFATE) 1 g tablet Take 1 tablet by mouth 4 (four) times daily -  before meals and at bedtime.     thiamine 100 MG tablet Take 1 tablet (100 mg total) by mouth daily. 30 tablet 0   No current facility-administered medications on file prior to visit.    There are no Patient Instructions on file for this visit. No follow-ups on file.   Kris Hartmann, NP

## 2021-11-29 ENCOUNTER — Other Ambulatory Visit: Payer: Self-pay

## 2021-11-29 DIAGNOSIS — I252 Old myocardial infarction: Secondary | ICD-10-CM | POA: Diagnosis not present

## 2021-11-29 DIAGNOSIS — I214 Non-ST elevation (NSTEMI) myocardial infarction: Secondary | ICD-10-CM

## 2021-11-29 DIAGNOSIS — Z9861 Coronary angioplasty status: Secondary | ICD-10-CM | POA: Diagnosis not present

## 2021-11-29 NOTE — Progress Notes (Signed)
Daily Session Note  Patient Details  Name: VINNIE GOMBERT MRN: [id number] Date of Birth: 01/23/1951 Referring Provider:   Flowsheet Row Cardiac Rehab from 08/19/2021 in [facility]  Referring Provider Cloretta Ned MD       Encounter Date: 11/29/2021  Check In:  Session Check In - 11/29/21 1338       Check-In   Supervising physician immediately available to respond to emergencies See telemetry face sheet for immediately available ER MD    Location [facility]-Cardiac & Pulmonary Rehab    Staff Present Birdie Sons, MPA, Nino Glow, MS, ASCM CEP, Exercise Physiologist;Joseph Tessie Fass, [place]    Virtual Visit No    Medication changes reported     No    Fall or balance concerns reported    No    Warm-up and Cool-down Performed on first and last piece of equipment    Resistance Training Performed Yes    VAD Patient? No    PAD/SET Patient? No      Pain Assessment   Currently in Pain? No/denies                Social History   Tobacco Use  Smoking Status Former   Packs/day: 2.00   Years: 40.00   Pack years: 80.00   Types: Cigarettes   Quit date: 11/27/2010   Years since quitting: 11.0  Smokeless Tobacco Never  Tobacco Comments   Quit in 2012    Goals Met:  Independence with exercise equipment Exercise tolerated well No report of concerns or symptoms today Strength training completed today  Goals Unmet:  Not Applicable  Comments: Pt able to follow exercise prescription today without complaint.  Will continue to monitor for progression.    Dr. Emily Filbert is Medical Director for [facility].  Dr. Ottie Glazier is Medical Director for [facility].

## 2021-12-01 ENCOUNTER — Other Ambulatory Visit: Payer: Self-pay

## 2021-12-01 DIAGNOSIS — Z9861 Coronary angioplasty status: Secondary | ICD-10-CM

## 2021-12-01 DIAGNOSIS — I252 Old myocardial infarction: Secondary | ICD-10-CM | POA: Diagnosis not present

## 2021-12-01 NOTE — Progress Notes (Signed)
Daily Session Note  Patient Details  Name: Damon English MRN: [id number] Date of Birth: Sep 14, 1951 Referring Provider:   Flowsheet Row Cardiac Rehab from 08/19/2021 in [facility] Cardiac [facility] Pulmonary Rehab  Referring Provider Cloretta Ned MD       Encounter Date: 12/01/2021  Check In:  Session Check In - 12/01/21 1337       Check-In   Supervising physician immediately available to respond to emergencies See telemetry face sheet for immediately available ER MD    Location [facility]    Staff Present Birdie Sons, MPA, RN;Joseph Lou Miner, MS, ASCM CEP, Exercise Physiologist    Virtual Visit No    Medication changes reported     No    Fall or balance concerns reported    No    Warm-up and Cool-down Performed on first and last piece of equipment    Resistance Training Performed Yes    VAD Patient? No    PAD/SET Patient? No      Pain Assessment   Currently in Pain? No/denies                Social History   Tobacco Use  Smoking Status Former   Packs/day: 2.00   Years: 40.00   Pack years: 80.00   Types: Cigarettes   Quit date: 11/27/2010   Years since quitting: 11.0  Smokeless Tobacco Never  Tobacco Comments   Quit in 2012    Goals Met:  Independence with exercise equipment Exercise tolerated well No report of concerns or symptoms today Strength training completed today  Goals Unmet:  Not Applicable  Comments: Pt able to follow exercise prescription today without complaint.  Will continue to monitor for progression.    Dr. Emily Filbert is Medical Director for [facility].  Dr. Ottie Glazier is Medical Director for [facility] Pulmonary Rehabilitation.

## 2021-12-02 ENCOUNTER — Ambulatory Visit (INDEPENDENT_AMBULATORY_CARE_PROVIDER_SITE_OTHER): Payer: Medicare Other | Admitting: Nurse Practitioner

## 2021-12-02 ENCOUNTER — Encounter (INDEPENDENT_AMBULATORY_CARE_PROVIDER_SITE_OTHER): Payer: BC Managed Care – PPO

## 2021-12-07 ENCOUNTER — Other Ambulatory Visit: Payer: Self-pay

## 2021-12-07 DIAGNOSIS — Z9861 Coronary angioplasty status: Secondary | ICD-10-CM

## 2021-12-07 DIAGNOSIS — I252 Old myocardial infarction: Secondary | ICD-10-CM | POA: Diagnosis not present

## 2021-12-07 NOTE — Progress Notes (Signed)
Daily Session Note  Patient Details  Name: Damon English MRN: [id number] Date of Birth: Jan 10, 1951 Referring Provider:   Flowsheet Row Cardiac Rehab from 08/19/2021 in [facility] Card[facility] Rehab  Referring Provider Cloretta Ned MD       Encounter Date: 12/07/2021  Check In:  Session Check In - 12/07/21 1113       Check-In   Supervising physician immediately available to respond to emergencies See telemetry face sheet for immediately available ER MD    Location [facility]-Cardiac & Pulmonary Rehab    Staff Present Birdie Sons, MPA, RN;Jessica Amistad, MA, RCEP, CCRP, CCET;Amanda Sommer, BA, ACSM CEP, Exercise Physiologist    Virtual Visit No    Medication changes reported     Yes    Comments cut his BP medication in half r/t low BP    Fall or balance concerns reported    No    Warm-up and Cool-down Performed on first and last piece of equipment    Resistance Training Performed Yes    VAD Patient? No    PAD/SET Patient? No      Pain Assessment   Currently in Pain? No/denies                Social History   Tobacco Use  Smoking Status Former   Packs/day: 2.00   Years: 40.00   Pack years: 80.00   Types: Cigarettes   Quit date: 11/27/2010   Years since quitting: 11.0  Smokeless Tobacco Never  Tobacco Comments   Quit in 2012    Goals Met:  Independence with exercise equipment Exercise tolerated well No report of concerns or symptoms today Strength training completed today  Goals Unmet:  Not Applicable  Comments: Pt able to follow exercise prescription today without complaint.  Will continue to monitor for progression.    Dr. Emily Filbert is Medical Director for [facility].  Dr. Ottie Glazier is Medical Director for [facility] Pulmonary Rehabilitation.

## 2021-12-08 ENCOUNTER — Encounter: Payer: Self-pay | Admitting: *Deleted

## 2021-12-08 DIAGNOSIS — Z9861 Coronary angioplasty status: Secondary | ICD-10-CM

## 2021-12-08 NOTE — Progress Notes (Signed)
Cardiac Individual Treatment Plan  Patient Details  Name: RAYNALDO FALCO MRN: [id number] Date of Birth: [birth year] Referring Provider:   Flowsheet Row Cardiac Rehab from 08/19/2021 in [facility] Cardiac and Pulmonary Rehab  Referring Provider Cloretta Ned MD       Initial Encounter Date:  Flowsheet Row Cardiac Rehab from 08/19/2021 in [facility] Cardiac and Pulmonary Rehab  Date 08/19/21       Visit Diagnosis: S/P PTCA (percutaneous transluminal coronary angioplasty)  Patient's Home Medications on Admission:  Current Outpatient Medications:    acetaminophen (TYLENOL) 325 MG tablet, Take 2 tablets (650 mg total) by mouth every 6 (six) hours as needed for fever., Disp: 20 tablet, Rfl: 0   acidophilus (RISAQUAD) CAPS capsule, Take 2 capsules by mouth 3 (three) times daily., Disp: 30 capsule, Rfl: 0   apixaban (ELIQUIS) 5 MG TABS tablet, Take by mouth., Disp: , Rfl:    atorvastatin (LIPITOR) 80 MG tablet, Take 80 mg by mouth daily., Disp: , Rfl:    brimonidine (ALPHAGAN) 0.2 % ophthalmic solution, Place 1 drop into both eyes 2 (two) times daily., Disp: 5 mL, Rfl: 12   Calcipotriene-Betameth Diprop (ENSTILAR) 0.005-0.064 % FOAM, , Disp: , Rfl:    Chlorhexidine Gluconate Cloth 2 % PADS, Apply 6 each topically daily., Disp: 1 each, Rfl: 0   clonazePAM (KLONOPIN) 0.25 MG disintegrating tablet, Take 1-2 tablets (0.25-0.5 mg total) by mouth 3 (three) times daily as needed (anxiety/agitation)., Disp: 20 tablet, Rfl: 0   clopidogrel (PLAVIX) 75 MG tablet, Take 1 tablet (75 mg total) by mouth daily., Disp: 30 tablet, Rfl: 0   diclofenac Sodium (VOLTAREN) 1 % GEL, Apply 4 g topically 4 (four) times daily., Disp: , Rfl:    docusate sodium (COLACE) 100 MG capsule, Take 1 capsule (100 mg total) by mouth 2 (two) times daily., Disp: 10 capsule, Rfl: 0   DULoxetine (CYMBALTA) 60 MG capsule, Take 1 capsule (60 mg total) by mouth 2 (two) times daily., Disp: 20 capsule, Rfl: 0   esomeprazole (NEXIUM) 40 MG  capsule, Take 1 capsule by mouth daily., Disp: , Rfl:    famotidine (PEPCID) 40 MG tablet, Take 1 tablet by mouth every morning., Disp: , Rfl:    ferrous sulfate 325 (65 FE) MG EC tablet, Take 1 tablet by mouth daily with breakfast., Disp: , Rfl:    folic acid (FOLVITE) 1 MG tablet, Take 1 tablet (1 mg total) by mouth daily., Disp: 30 tablet, Rfl: 0   furosemide (LASIX) 20 MG tablet, Take 20 mg by mouth daily as needed (Edema (Or weight gain of >2lbs overnight or 5lbs in one week))., Disp: , Rfl:    glipiZIDE (GLUCOTROL) 10 MG tablet, Take 5 mg by mouth daily before breakfast., Disp: , Rfl:    guaiFENesin (MUCINEX) 600 MG 12 hr tablet, Take 1 tablet (600 mg total) by mouth 2 (two) times daily., Disp: 30 tablet, Rfl: 0   isosorbide mononitrate (IMDUR) 30 MG 24 hr tablet, Take 1 tablet (30 mg total) by mouth daily., Disp: 30 tablet, Rfl: 0   ketoconazole (NIZORAL) 2 % shampoo, Apply topically., Disp: , Rfl:    latanoprost (XALATAN) 0.005 % ophthalmic solution, Place 1 drop into both eyes at bedtime., Disp: 2.5 mL, Rfl: 12   losartan (COZAAR) 25 MG tablet, Take 25 mg by mouth daily., Disp: , Rfl:    Magnesium 250 MG TABS, Take by mouth., Disp: , Rfl:    metoprolol succinate (TOPROL-XL) 25 MG 24 hr tablet, Take 1 tablet by  mouth at bedtime., Disp: , Rfl:    Multiple Vitamin (MULTIVITAMIN WITH MINERALS) TABS tablet, Take 1 tablet by mouth daily., Disp: 30 tablet, Rfl: 0   nitroGLYCERIN (NITROSTAT) 0.4 MG SL tablet, Place 1 tablet (0.4 mg total) under the tongue every 5 (five) minutes as needed for chest pain., Disp: 20 tablet, Rfl: 12   omeprazole (PRILOSEC) 40 MG capsule, Take 40 mg by mouth daily., Disp: , Rfl:    ondansetron (ZOFRAN) 4 MG/2ML SOLN injection, Inject 2 mLs (4 mg total) into the vein every 6 (six) hours as needed for nausea., Disp: 2 mL, Rfl: 0   phenol (CHLORASEPTIC) 1.4 % LIQD, Use as directed 1 spray in the mouth or throat as needed for throat irritation / pain., Disp: 10 mL, Rfl: 0    polyethylene glycol (MIRALAX / GLYCOLAX) 17 g packet, Take 17 g by mouth daily as needed for mild constipation or moderate constipation., Disp: 14 each, Rfl: 0   potassium chloride SA (KLOR-CON) 20 MEQ tablet, Take by mouth. 6 tablets, Disp: , Rfl:    ranolazine (RANEXA) 500 MG 12 hr tablet, Take 500 mg by mouth 2 (two) times daily., Disp: , Rfl:    SENNOSIDES PO, Take by mouth., Disp: , Rfl:    spironolactone (ALDACTONE) 25 MG tablet, Take 12.5 mg by mouth., Disp: , Rfl:    sucralfate (CARAFATE) 1 g tablet, Take 1 tablet by mouth 4 (four) times daily -  before meals and at bedtime., Disp: , Rfl:    thiamine 100 MG tablet, Take 1 tablet (100 mg total) by mouth daily., Disp: 30 tablet, Rfl: 0  Past Medical History: Past Medical History:  Diagnosis Date   Alcohol use    "cutting back" but still heavy and daily, multiple shots of bourbon each night   Diabetes mellitus without complication ([place])    History of tobacco use    reportedly quit in 2013   Hypertension    Pulmonary embolism ([place]) 2012   unprovoked   PVD (peripheral vascular disease) ([place])     Tobacco Use: Social History   Tobacco Use  Smoking Status Former   Packs/day: 2.00   Years: 40.00   Pack years: 80.00   Types: Cigarettes   Quit date: 11/27/2010   Years since quitting: 11.0  Smokeless Tobacco Never  Tobacco Comments   Quit in 2012    Labs: Recent Review Flowsheet Data     Labs for ITP Cardiac and Pulmonary Rehab Latest Ref Rng & Units 01/28/2019 01/29/2019 06/18/2021 06/18/2021 06/18/2021   Cholestrol 0 - 200 mg/dL 168 143 - - -   LDLCALC 0 - 99 mg/dL 113(H) 93 - - -   HDL >40 mg/dL 36(L) 33(L) - - -   Trlycerides <150 mg/dL 97 87 - - -   Hemoglobin A1c 4.8 - 5.6 % - - - - 6.1(H)   PHART 7.350 - 7.450 - - 7.58(H) 7.48(H) -   PCO2ART 32.0 - 48.0 mmHg - - 28(L) 47 -   HCO3 20.0 - 28.0 mmol/L - - 26.3 35.0(H) -   O2SAT % - - 99.9 99.0 -        Exercise Target Goals: Exercise Program Goal: Individual exercise  prescription set using results from initial 6 min walk test and THRR while considering  patients activity barriers and safety.   Exercise Prescription Goal: Initial exercise prescription builds to 30-45 minutes a day of aerobic activity, 2-3 days per week.  Home exercise guidelines will be given to  patient during program as part of exercise prescription that the participant will acknowledge.   Education: Aerobic Exercise: - Group verbal and visual presentation on the components of exercise prescription. Introduces F.I.T.T principle from ACSM for exercise prescriptions.  Reviews F.I.T.T. principles of aerobic exercise including progression. Written material given at graduation. Flowsheet Row Cardiac Rehab from 09/19/2019 in [facility] Cardiac and Pulmonary Rehab  Date 08/22/19  Educator jh  Instruction Review Code 1- Verbalizes Understanding       Education: Resistance Exercise: - Group verbal and visual presentation on the components of exercise prescription. Introduces F.I.T.T principle from ACSM for exercise prescriptions  Reviews F.I.T.T. principles of resistance exercise including progression. Written material given at graduation.    Education: Exercise & Equipment Safety: - Individual verbal instruction and demonstration of equipment use and safety with use of the equipment. Flowsheet Row Cardiac Rehab from 12/01/2021 in [facility] Cardiac and Pulmonary Rehab  Date 08/19/21  Educator [place]  Instruction Review Code 1- Verbalizes Understanding       Education: Exercise Physiology & General Exercise Guidelines: - Group verbal and written instruction with models to review the exercise physiology of the cardiovascular system and associated critical values. Provides general exercise guidelines with specific guidelines to those with heart or lung disease.  Flowsheet Row Cardiac Rehab from 12/01/2021 in [facility] Cardiac and Pulmonary Rehab  Date 10/07/21  Educator [facility]  Instruction Review Code 1- Verbalizes  Understanding       Education: Flexibility, Balance, Mind/Body Relaxation: - Group verbal and visual presentation with interactive activity on the components of exercise prescription. Introduces F.I.T.T principle from ACSM for exercise prescriptions. Reviews F.I.T.T. principles of flexibility and balance exercise training including progression. Also discusses the mind body connection.  Reviews various relaxation techniques to help reduce and manage stress (i.e. Deep breathing, progressive muscle relaxation, and visualization). Balance handout provided to take home. Written material given at graduation. Flowsheet Row Cardiac Rehab from 12/01/2021 in [facility] Cardiac and Pulmonary Rehab  Date 08/26/21  Educator [place]  Instruction Review Code 1- Verbalizes Understanding       Activity Barriers & Risk Stratification:  Activity Barriers & Cardiac Risk Stratification - 08/19/21 1435       Activity Barriers & Cardiac Risk Stratification   Activity Barriers Balance Concerns;Muscular Weakness;Deconditioning;Chest Pain/Angina;History of Falls;Assistive Device;Back Problems    Comments uses walker on occasion, neuropathy, arm ROM restriction due to new PPM    Cardiac Risk Stratification High             6 Minute Walk:   Oxygen Initial Assessment:   Oxygen Re-Evaluation:   Oxygen Discharge (Final Oxygen Re-Evaluation):   Initial Exercise Prescription:  Initial Exercise Prescription - 08/19/21 1400       Date of Initial Exercise RX and Referring Provider   Date 08/19/21    Referring Provider Cloretta Ned MD      Oxygen   Maintain Oxygen Saturation 88% or higher      Recumbant Bike   Level 1    RPM 60    Minutes 15    METs 1.7      NuStep   Level 1    SPM 80    Minutes 15    METs 1.7      Biostep-RELP   Level 1    SPM 50    Minutes 15    METs 1.7      Track   Laps 5   as tolerated   Minutes 15    METs 1.2  Prescription Details   Frequency (times per  week) 2    Duration Progress to 30 minutes of continuous aerobic without signs/symptoms of physical distress      Intensity   THRR 40-80% of Max Heartrate 104-136    Ratings of Perceived Exertion 11-15    Perceived Dyspnea 0-4      Resistance Training   Training Prescription Yes    Weight 3 lb   ROM on left   Reps 10-15             Perform Capillary Blood Glucose checks as needed.  Exercise Prescription Changes:   Exercise Prescription Changes     Row Name 08/19/21 1400 08/30/21 1400 09/13/21 1000 09/28/21 1200 10/13/21 1200     Response to Exercise   Blood Pressure (Admit) 136/70 126/72 140/68 128/64 118/62   Blood Pressure (Exercise) 148/76 126/64 -- 138/70 --   Blood Pressure (Exit) 128/76 120/70 124/69 122/62 124/66   Heart Rate (Admit) 73 bpm 130 bpm 76 bpm 74 bpm 59 bpm   Heart Rate (Exercise) 107 bpm 130 bpm 90 bpm 128 bpm 120 bpm   Heart Rate (Exit) 74 bpm 84 bpm 75 bpm 77 bpm 68 bpm   Oxygen Saturation (Admit) 99 % -- -- -- --   Oxygen Saturation (Exercise) 100 % -- -- -- --   Oxygen Saturation (Exit) 98 % -- -- -- --   Rating of Perceived Exertion (Exercise) '15 15 11 12 12   ' Perceived Dyspnea (Exercise) 1 -- -- -- --   Symptoms chest tightness 2/10, fatigue -- -- -- chest pain   Comments walk test results -- -- -- --   Duration Continue with 30 min of aerobic exercise without signs/symptoms of physical distress. Continue with 30 min of aerobic exercise without signs/symptoms of physical distress. Continue with 30 min of aerobic exercise without signs/symptoms of physical distress. Continue with 30 min of aerobic exercise without signs/symptoms of physical distress. Continue with 30 min of aerobic exercise without signs/symptoms of physical distress.   Intensity THRR unchanged THRR unchanged THRR unchanged THRR unchanged THRR unchanged     Progression   Progression -- Continue to progress workloads to maintain intensity without signs/symptoms of physical  distress. Continue to progress workloads to maintain intensity without signs/symptoms of physical distress. Continue to progress workloads to maintain intensity without signs/symptoms of physical distress. Continue to progress workloads to maintain intensity without signs/symptoms of physical distress.   Average METs -- 2 2 1.4 1.35     Resistance Training   Training Prescription -- Yes Yes Yes Yes   Weight -- 3 lb 3 lb 3 lb 3 lb   Reps -- 10-15 10-15 10-15 10-15     Interval Training   Interval Training -- No No No No     NuStep   Level -- '3 3 3 3   ' Minutes -- '15 15 15 30   ' METs -- 2 -- 1.6 1.7     REL-XR   Level -- -- 1 -- 1   Minutes -- -- 15 -- 30   METs -- -- -- -- 1     Biostep-RELP   Level -- 1 -- 1 --   Minutes -- 15 -- 15 --   METs -- 2 -- 1.2 --     Oxygen   Maintain Oxygen Saturation -- -- -- -- 88% or higher    Row Name 12/07/21 1500  Response to Exercise   Blood Pressure (Admit) 124/70       Blood Pressure (Exercise) 160/68       Blood Pressure (Exit) 122/68       Heart Rate (Admit) 71 bpm       Heart Rate (Exercise) 99 bpm       Heart Rate (Exit) 72 bpm       Rating of Perceived Exertion (Exercise) 13       Symptoms chest pain walking       Duration Continue with 30 min of aerobic exercise without signs/symptoms of physical distress.       Intensity THRR unchanged         Progression   Progression Continue to progress workloads to maintain intensity without signs/symptoms of physical distress.       Average METs 2.28         Resistance Training   Training Prescription Yes       Weight 3 lb       Reps 10-15         Interval Training   Interval Training No         Recumbant Bike   Level 1       Minutes 15       METs 2.56         NuStep   Level 3       Minutes 15         Biostep-RELP   Level 1       Minutes 15       METs 2         Track   Laps 4       Minutes 10         Home Exercise Plan   Plans to continue exercise  at Home (comment)  walking, videos       Frequency Add 2 additional days to program exercise sessions.       Initial Home Exercises Provided 10/07/21         Oxygen   Maintain Oxygen Saturation 88% or higher                Exercise Comments:   Exercise Comments     Row Name 08/26/21 1107           Exercise Comments First full day of exercise!  Patient was oriented to gym and equipment including functions, settings, policies, and procedures.  Patient's individual exercise prescription and treatment plan were reviewed.  All starting workloads were established based on the results of the 6 minute walk test done at initial orientation visit.  The plan for exercise progression was also introduced and progression will be customized based on patient's performance and goals.                Exercise Goals and Review:   Exercise Goals     Row Name 08/19/21 1500             Exercise Goals   Increase Physical Activity Yes       Intervention Provide advice, education, support and counseling about physical activity/exercise needs.;Develop an individualized exercise prescription for aerobic and resistive training based on initial evaluation findings, risk stratification, comorbidities and participant's personal goals.       Expected Outcomes Short Term: Attend rehab on a regular basis to increase amount of physical activity.;Long Term: Add in home exercise to make exercise part of routine and to increase amount of physical activity.;Long  Term: Exercising regularly at least 3-5 days a week.       Increase Strength and Stamina Yes       Intervention Provide advice, education, support and counseling about physical activity/exercise needs.;Develop an individualized exercise prescription for aerobic and resistive training based on initial evaluation findings, risk stratification, comorbidities and participant's personal goals.       Expected Outcomes Short Term: Increase workloads from  initial exercise prescription for resistance, speed, and METs.;Short Term: Perform resistance training exercises routinely during rehab and add in resistance training at home;Long Term: Improve cardiorespiratory fitness, muscular endurance and strength as measured by increased METs and functional capacity (6MWT)       Able to understand and use rate of perceived exertion (RPE) scale Yes       Intervention Provide education and explanation on how to use RPE scale       Expected Outcomes Short Term: Able to use RPE daily in rehab to express subjective intensity level;Long Term:  Able to use RPE to guide intensity level when exercising independently       Able to understand and use Dyspnea scale Yes       Intervention Provide education and explanation on how to use Dyspnea scale       Expected Outcomes Short Term: Able to use Dyspnea scale daily in rehab to express subjective sense of shortness of breath during exertion;Long Term: Able to use Dyspnea scale to guide intensity level when exercising independently       Knowledge and understanding of Target Heart Rate Range (THRR) Yes       Intervention Provide education and explanation of THRR including how the numbers were predicted and where they are located for reference       Expected Outcomes Short Term: Able to state/look up THRR;Short Term: Able to use daily as guideline for intensity in rehab;Long Term: Able to use THRR to govern intensity when exercising independently       Able to check pulse independently Yes       Intervention Provide education and demonstration on how to check pulse in carotid and radial arteries.;Review the importance of being able to check your own pulse for safety during independent exercise       Expected Outcomes Short Term: Able to explain why pulse checking is important during independent exercise;Long Term: Able to check pulse independently and accurately       Understanding of Exercise Prescription Yes        Intervention Provide education, explanation, and written materials on patient's individual exercise prescription       Expected Outcomes Short Term: Able to explain program exercise prescription;Long Term: Able to explain home exercise prescription to exercise independently                Exercise Goals Re-Evaluation :  Exercise Goals Re-Evaluation     Row Name 08/26/21 1108 08/30/21 1422 09/13/21 1023 09/28/21 1114 10/13/21 1216     Exercise Goal Re-Evaluation   Exercise Goals Review Able to understand and use rate of perceived exertion (RPE) scale;Knowledge and understanding of Target Heart Rate Range (THRR);Understanding of Exercise Prescription;Increase Physical Activity;Increase Strength and Stamina;Able to understand and use Dyspnea scale;Able to check pulse independently Increase Physical Activity;Increase Strength and Stamina Increase Physical Activity;Increase Strength and Stamina Increase Physical Activity;Increase Strength and Stamina;Able to understand and use rate of perceived exertion (RPE) scale;Able to understand and use Dyspnea scale;Knowledge and understanding of Target Heart Rate Range (THRR);Able to check pulse independently;Understanding  of Exercise Prescription Increase Physical Activity;Increase Strength and Stamina;Understanding of Exercise Prescription   Comments Reviewed RPE and dyspnea scales, THR and program prescription with pt today.  Pt voiced understanding and was given a copy of goals to take home. Today was Garys second session. he workes at RPE 14-15.  He reached THR range.  We will monitor progress. Lerone has been having trouble with getting his blood sugars up before exercise and therefore has not been able to complete a full exercise session due to limited time. He is on T4 Nustep for level 3 and is tolerating his current exercise prescription. Will continue to monitor and hope he can manage his blood sugars better with exercise. Reviewed home exercise with pt  today.  Pt plans to walk and use staff videos at home for exercise.  Reviewed THR, pulse, RPE, sign and symptoms, pulse oximetery and when to call 911 or MD.  Also discussed weather considerations and indoor options.  Pt voiced understanding. He will also keep close eye on his sugars. Jacolby is doing well in rehab.  He was up to level 3 on the NuStep, but last week was having some chest pain, so he dropped back to level 1.  He has tried out the XR and finds it challenging.  We will conitnue to monitor his progress.   Expected Outcomes Short: Use RPE daily to regulate intensity. Long: Follow program prescription in THR. Short:  attend consistently Long:  improve overall stamina Short: Manage blood glucose better prior to exercise Long: Gradually increase overall MET level Short: Start to add  in more exercise at home Long: Continue to move more. Short: Work back up to level 3 on NuStep.  Long: conitnue to improve stamina    Row Name 12/07/21 1516             Exercise Goal Re-Evaluation   Exercise Goals Review Increase Physical Activity;Increase Strength and Stamina;Understanding of Exercise Prescription       Comments Nataniel is doing well back in rehab.  He has completed 3 sessions since returning this month.  He is up to 2 METs on the BioStep.  We will continue to montior his progress.       Expected Outcomes Short: Continue to attend regularly again Long: Conitnue to improve stamina                Discharge Exercise Prescription (Final Exercise Prescription Changes):  Exercise Prescription Changes - 12/07/21 1500       Response to Exercise   Blood Pressure (Admit) 124/70    Blood Pressure (Exercise) 160/68    Blood Pressure (Exit) 122/68    Heart Rate (Admit) 71 bpm    Heart Rate (Exercise) 99 bpm    Heart Rate (Exit) 72 bpm    Rating of Perceived Exertion (Exercise) 13    Symptoms chest pain walking    Duration Continue with 30 min of aerobic exercise without signs/symptoms of physical  distress.    Intensity THRR unchanged      Progression   Progression Continue to progress workloads to maintain intensity without signs/symptoms of physical distress.    Average METs 2.28      Resistance Training   Training Prescription Yes    Weight 3 lb    Reps 10-15      Interval Training   Interval Training No      Recumbant Bike   Level 1    Minutes 15    METs 2.56  NuStep   Level 3    Minutes 15      Biostep-RELP   Level 1    Minutes 15    METs 2      Track   Laps 4    Minutes 10      Home Exercise Plan   Plans to continue exercise at Home (comment)   walking, videos   Frequency Add 2 additional days to program exercise sessions.    Initial Home Exercises Provided 10/07/21      Oxygen   Maintain Oxygen Saturation 88% or higher             Nutrition:  Target Goals: Understanding of nutrition guidelines, daily intake of sodium <1555m, cholesterol <2056m calories 30% from fat and 7% or less from saturated fats, daily to have 5 or more servings of fruits and vegetables.  Education: All About Nutrition: -Group instruction provided by verbal, written material, interactive activities, discussions, models, and posters to present general guidelines for heart healthy nutrition including fat, fiber, MyPlate, the role of sodium in heart healthy nutrition, utilization of the nutrition label, and utilization of this knowledge for meal planning. Follow up email sent as well. Written material given at graduation. [facility]  Date 09/02/21  Educator [facility] CenterInstruction Review Code 1- Verbalizes Understanding       Biometrics:  Pre Biometrics - 08/19/21 1436       Pre Biometrics   Height 5' 6.5" (1.689 m)    Weight 193 lb 4.8 oz (87.7 kg)    BMI (Calculated) 30.74              Nutrition Therapy Plan and Nutrition Goals:  Nutrition Therapy & Goals - 09/08/21 1331       Nutrition  Therapy   Diet Heart healthy, low Na, diabetes friendly    Drug/Food Interactions Statins/Certain Fruits    Protein (specify units) 70g    Fiber 25 grams    Whole Grain Foods 3 servings    Saturated Fats 12 max. grams    Fruits and Vegetables 5 servings/day    Sodium 1.5 grams      Personal Nutrition Goals   Nutrition Goal ST: pair carbohydrates with fiber, fat, and protein. Eat 2-4 CHO per meal. LT: maintain heart healthy changes, limit BG lows    Comments T2DM controlled by diet. BG has been 85-90 1st thing in the morning and in the day 125-130. He reports his wife said he is not eating as much as he used to. He is concerned about his weight - he feels like his weight is going down unintentionally. Wife makes cookies with flaxseeds (1 per day throughout the day). B: cereal (multigrain cheerios with almond milk with blueberries, strawberries, or banana) or eggs with 1 slice of whole wheat toast at home or at the grill. He does not eat bacon at all anymore and he will eat sausage rarely. OJ with lemonade. L: leftovers or sandiwch with whole wheat bread (tu[place]ith lite mayo and lettuce and tomato) or going out to eat (grilled food like burgers at mom and pop place and some vegetables) D: grilled chicken, grilled fish with a salad or another vegetable (he eats a variety), when going out to eat will get baked potato and eat half. He loves sweet potatoes and zucchini. Tomato based foods can give him reflux. Drinks: pepsi zero (2: 16 oz bottle). He used to drink liquor,  but has stopped. He goes out to eat 3-4x/week and will have leftovers from that. Reviewed heart healthy eating, how eating at home can help to limit Na, increase whole grains, increase variety, and allows use of healthy oils/fats such as olive oil which he mentioned is all they use at home. Discussed diabetes friendly eating.      Intervention Plan   Intervention Prescribe, educate and counsel regarding individualized specific dietary  modifications aiming towards targeted core components such as weight, hypertension, lipid management, diabetes, heart failure and other comorbidities.    Expected Outcomes Short Term Goal: Understand basic principles of dietary content, such as calories, fat, sodium, cholesterol and nutrients.;Short Term Goal: A plan has been developed with personal nutrition goals set during dietitian appointment.;Long Term Goal: Adherence to prescribed nutrition plan.             Nutrition Assessments:  MEDIFICTS Score Key: ?70 Need to make dietary changes  40-70 Heart Healthy Diet ? 40 Therapeutic Level Cholesterol Diet  Flowsheet Row Cardiac Rehab from 08/19/2021 in [facility] Cardiac and Pulmonary Rehab  Picture Your Plate Total Score on Admission 76      Picture Your Plate Scores: <62 Unhealthy dietary pattern with much room for improvement. 41-50 Dietary pattern unlikely to meet recommendations for good health and room for improvement. 51-60 More healthful dietary pattern, with some room for improvement.  >60 Healthy dietary pattern, although there may be some specific behaviors that could be improved.    Nutrition Goals Re-Evaluation:  Nutrition Goals Re-Evaluation     Eldred Name 09/28/21 1118 12/01/21 1403           Goals   Current Weight -- 184 lb (83.5 kg)      Nutrition Goal ST: pair carbohydrates with fiber, fat, and protein. Eat 2-4 CHO per meal. LT: maintain heart healthy changes, limit BG lows Get more heart healthy.      Comment Vale is doing well in rehab.  He is keeping his weight down and sticks to a healthy diet for the most part.  He has been lots of leftovers from Thanksgiving.  He has been eating lots of eggs and enjoys them.  He has cut out the bacon as it was causing him to hurt.  He is getting lots of vegetables Deanne Coffer wants to be go back how he was eating. He feels like he has somehwat went back to eating unhealthier foods. He does not eat fried food but he is tending to get  faster foods.      Expected Outcome Short: Continue to focus on healthy eating. Long: Continue to montior salt levels Short: get back to making healthy food choices. Long: maintain healthy eating habits.               Nutrition Goals Discharge (Final Nutrition Goals Re-Evaluation):  Nutrition Goals Re-Evaluation - 12/01/21 1403       Goals   Current Weight 184 lb (83.5 kg)    Nutrition Goal Get more heart healthy.    Comment Deanne Coffer wants to be go back how he was eating. He feels like he has somehwat went back to eating unhealthier foods. He does not eat fried food but he is tending to get faster foods.    Expected Outcome Short: get back to making healthy food choices. Long: maintain healthy eating habits.             Psychosocial: Target Goals: Acknowledge presence or absence of significant depression and/or stress, maximize coping  skills, provide positive support system. Participant is able to verbalize types and ability to use techniques and skills needed for reducing stress and depression.   Education: Stress, Anxiety, and Depression - Group verbal and visual presentation to define topics covered.  Reviews how body is impacted by stress, anxiety, and depression.  Also discusses healthy ways to reduce stress and to treat/manage anxiety and depression.  Written material given at graduation. Flowsheet Row Cardiac Rehab from 12/01/2021 in [facility] Cardiac and Pulmonary Rehab  Date 09/30/21  Educator [place]  Instruction Review Code 1- Verbalizes Understanding       Education: Sleep Hygiene -Provides group verbal and written instruction about how sleep can affect your health.  Define sleep hygiene, discuss sleep cycles and impact of sleep habits. Review good sleep hygiene tips.    Initial Review & Psychosocial Screening:  Initial Psych Review & Screening - 08/02/21 1544       Initial Review   Current issues with Current Stress Concerns;Current Sleep Concerns    Source of Stress  Concerns Chronic Illness      Family Dynamics   Good Support System? Yes   wife     Barriers   Psychosocial barriers to participate in program There are no identifiable barriers or psychosocial needs.;The patient should benefit from training in stress management and relaxation.      Screening Interventions   Interventions Encouraged to exercise;Provide feedback about the scores to participant;To provide support and resources with identified psychosocial needs    Expected Outcomes Short Term goal: Utilizing psychosocial counselor, staff and physician to assist with identification of specific Stressors or current issues interfering with healing process. Setting desired goal for each stressor or current issue identified.;Long Term Goal: Stressors or current issues are controlled or eliminated.;Short Term goal: Identification and review with participant of any Quality of Life or Depression concerns found by scoring the questionnaire.;Long Term goal: The participant improves quality of Life and PHQ9 Scores as seen by post scores and/or verbalization of changes             Quality of Life Scores:   Quality of Life - 08/19/21 1434       Quality of Life   Select Quality of Life      Quality of Life Scores   Health/Function Pre 17.13 %    Socioeconomic Pre 28.29 %    Psych/Spiritual Pre 22.29 %    Family Pre 27.6 %    GLOBAL Pre 22.03 %            Scores of 19 and below usually indicate a poorer quality of life in these areas.  A difference of  2-3 points is a clinically meaningful difference.  A difference of 2-3 points in the total score of the Quality of Life Index has been associated with significant improvement in overall quality of life, self-image, physical symptoms, and general health in studies assessing change in quality of life.  PHQ-9: Recent Review Flowsheet Data     Depression screen [facility] 2/9 12/01/2021 08/19/2021 10/01/2019 07/02/2019 05/31/2019   Decreased Interest 0 0 0  0 0   Down, Depressed, Hopeless 1 0 0 0 1   PHQ - 2 Score 1 0 0 0 1   Altered sleeping 0 1 - 1 3   Tired, decreased energy '1 2 1 1 3   ' Change in appetite 0 0 0 0 0   Feeling bad or failure about yourself  0 0 1 0 0   Trouble  concentrating 0 0 0 0 0   Moving slowly or fidgety/restless '1 1 3 1 ' 0   Suicidal thoughts 0 0 0 0 0   PHQ-9 Score 3 4 - 3 7   Difficult doing work/chores Not difficult at all Somewhat difficult Somewhat difficult Not difficult at all Somewhat difficult      Interpretation of Total Score  Total Score Depression Severity:  1-4 = Minimal depression, 5-9 = Mild depression, 10-14 = Moderate depression, 15-19 = Moderately severe depression, 20-27 = Severe depression   Psychosocial Evaluation and Intervention:  Psychosocial Evaluation - 08/02/21 1608       Psychosocial Evaluation & Interventions   Interventions Encouraged to exercise with the program and follow exercise prescription;Stress management education;Relaxation education    Comments Mackey is 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 He stated he felt great after graduation and wants to get back to that feeling. He does have balance concerns related to his neuropathy and uses a walker sometimes so that hinders his exercise at times. His wife is very supportive and manages his health care. They both want him to have increased stamina and is hopeful this program will help him heal. He does state that he has had night terrors that have woken him up with chest pain and requiring a nitro. His MD is aware and has encouraged him to take a Klonopin nightly which has seemed to help some.    Expected Outcomes Short: attend cardiac rehab for education and exercise. Long: develop and maintain positive self care  habits.    Continue Psychosocial Services  Follow up required by staff             Psychosocial Re-Evaluation:  Psychosocial Re-Evaluation     Gaastra Name 09/28/21 1115 12/01/21 1401           Psychosocial Re-Evaluation   Current issues with Current Stress Concerns Current Psychotropic Meds      Comments Sovereign is doing well in rehab.  He tries to deal with is stressors from time to time.  They have been tackling Christmas shopping and get frustrated with traffic and people.  He choose to eat and then just sit in the car to relax and not have to deal with the people. He is sleeping good and uses his klonopin at night but it helps him sleep and keeps his vivid dreams to a minimum.  He does have issues with his blood sugars and breathing which can get frustrating, but he does the best he can with it. Reviewed patient health questionnaire (PHQ-9) with patient for follow up. Previously, patients score indicated signs/symptoms of depression.  Reviewed to see if patient is improving symptom wise while in program.  Score improved and patient states that it is because he has been able to exercise and he takes klonopin if he gets anxious.      Expected Outcomes Short: Continue to exerise for mental boost Long: Conitnue to stay positive Short: Continue to attend [facility] regularly for regular exercise and social engagement. Long: Continue to improve symptoms and manage a positive mental state.      Interventions Encouraged to attend Cardiac Rehabilitation for the exercise Encouraged to attend Cardiac Rehabilitation for the exercise  Continue Psychosocial Services  Follow up required by staff Follow up required by staff               Psychosocial Discharge (Final Psychosocial Re-Evaluation):  Psychosocial Re-Evaluation - 12/01/21 1401       Psychosocial Re-Evaluation   Current issues with Current Psychotropic Meds    Comments Reviewed patient health questionnaire (PHQ-9) with patient for  follow up. Previously, patients score indicated signs/symptoms of depression.  Reviewed to see if patient is improving symptom wise while in program.  Score improved and patient states that it is because he has been able to exercise and he takes klonopin if he gets anxious.    Expected Outcomes Short: Continue to attend [facility] regularly for regular exercise and social engagement. Long: Continue to improve symptoms and manage a positive mental state.    Interventions Encouraged to attend Cardiac Rehabilitation for the exercise    Continue Psychosocial Services  Follow up required by staff             Vocational Rehabilitation: Provide vocational rehab assistance to qualifying candidates.   Vocational Rehab Evaluation & Intervention:  Vocational Rehab - 08/02/21 1544       Initial Vocational Rehab Evaluation & Intervention   Assessment shows need for Vocational Rehabilitation No             Education: Education Goals: Education classes will be provided on a variety of topics geared toward better understanding of heart health and risk factor modification. Participant will state understanding/return demonstration of topics presented as noted by education test scores.  Learning Barriers/Preferences:  Learning Barriers/Preferences - 08/02/21 1541       Learning Barriers/Preferences   Learning Barriers None    Learning Preferences None             General Cardiac Education Topics:  AED/CPR: - Group verbal and written instruction with the use of models to demonstrate the basic use of the AED with the basic ABC's of resuscitation.   Anatomy and Cardiac Procedures: - Group verbal and visual presentation and models provide information about basic cardiac anatomy and function. Reviews the testing methods done to diagnose heart disease and the outcomes of the test results. Describes the treatment choices: Medical Management, Angioplasty, or Coronary Bypass Surgery for  treating various heart conditions including Myocardial Infarction, Angina, Valve Disease, and Cardiac Arrhythmias.  Written material given at graduation.   Medication Safety: - Group verbal and visual instruction to review commonly prescribed medications for heart and lung disease. Reviews the medication, class of the drug, and side effects. Includes the steps to properly store meds and maintain the prescription regimen.  Written material given at graduation.   Intimacy: - Group verbal instruction through game format to discuss how heart and lung disease can affect sexual intimacy. Written material given at graduation..   Know Your Numbers and Heart Failure: - Group verbal and visual instruction to discuss disease risk factors for cardiac and pulmonary disease and treatment options.  Reviews associated critical values for Overweight/Obesity, Hypertension, Cholesterol, and Diabetes.  Discusses basics of heart failure: signs/symptoms and treatments.  Introduces Heart Failure Zone chart for action plan for heart failure.  Written material given at graduation. Flowsheet Row Cardiac Rehab from 12/01/2021 in [facility] Cardiac and Pulmonary Rehab  Date 09/16/21  Educator [place]  Instruction Review Code 1- Verbalizes Understanding       Infection Prevention: - Provides verbal and written material to individual with discussion of infection control including proper hand  washing and proper equipment cleaning during exercise session. Flowsheet Row Cardiac Rehab from 12/01/2021 in [facility] Cardiac and Pulmonary Rehab  Date 08/19/21  Educator [place]  Instruction Review Code 1- Verbalizes Understanding       Falls Prevention: - Provides verbal and written material to individual with discussion of falls prevention and safety. Flowsheet Row Cardiac Rehab from 12/01/2021 in [facility] Cardiac and Pulmonary Rehab  Date 08/19/21  Educator [place]  Instruction Review Code 2- Demonstrated Understanding       Other: -Provides  group and verbal instruction on various topics (see comments)   Knowledge Questionnaire Score:  Knowledge Questionnaire Score - 08/19/21 1434       Knowledge Questionnaire Score   Pre Score 25/26: Exercise             Core Components/Risk Factors/Patient Goals at Admission:  Personal Goals and Risk Factors at Admission - 08/19/21 1502       Core Components/Risk Factors/Patient Goals on Admission    Weight Management Yes;Weight Loss    Intervention Weight Management: Develop a combined nutrition and exercise program designed to reach desired caloric intake, while maintaining appropriate intake of nutrient and fiber, sodium and fats, and appropriate energy expenditure required for the weight goal.;Weight Management: Provide education and appropriate resources to help participant work on and attain dietary goals.;Weight Management/Obesity: Establish reasonable short term and long term weight goals.    Admit Weight 193 lb (87.5 kg)    Goal Weight: Short Term 188 lb (85.3 kg)    Goal Weight: Long Term 180 lb (81.6 kg)    Expected Outcomes Short Term: Continue to assess and modify interventions until short term weight is achieved;Long Term: Adherence to nutrition and physical activity/exercise program aimed toward attainment of established weight goal;Weight Loss: Understanding of general recommendations for a balanced deficit meal plan, which promotes 1-2 lb weight loss per week and includes a negative energy balance of [phone] kcal/d;Understanding recommendations for meals to include 15-35% energy as protein, 25-35% energy from fat, 35-60% energy from carbohydrates, less than 272m of dietary cholesterol, 20-35 gm of total fiber daily;Understanding of distribution of calorie intake throughout the day with the consumption of 4-5 meals/snacks    Diabetes Yes    Intervention Provide education about signs/symptoms and action to take for hypo/hyperglycemia.;Provide education about proper  nutrition, including hydration, and aerobic/resistive exercise prescription along with prescribed medications to achieve blood glucose in normal ranges: Fasting glucose 65-99 mg/dL    Expected Outcomes Short Term: Participant verbalizes understanding of the signs/symptoms and immediate care of hyper/hypoglycemia, proper foot care and importance of medication, aerobic/resistive exercise and nutrition plan for blood glucose control.;Long Term: Attainment of HbA1C < 7%.    Hypertension Yes    Intervention Provide education on lifestyle modifcations including regular physical activity/exercise, weight management, moderate sodium restriction and increased consumption of fresh fruit, vegetables, and low fat dairy, alcohol moderation, and smoking cessation.;Monitor prescription use compliance.    Expected Outcomes Short Term: Continued assessment and intervention until BP is < 140/942mHG in hypertensive participants. < 130/8047mG in hypertensive participants with diabetes, heart failure or chronic kidney disease.;Long Term: Maintenance of blood pressure at goal levels.    Lipids Yes    Intervention Provide education and support for participant on nutrition & aerobic/resistive exercise along with prescribed medications to achieve LDL <18m66mDL >40mg48m Expected Outcomes Short Term: Participant states understanding of desired cholesterol values and is compliant with medications prescribed. Participant is following exercise prescription and nutrition  guidelines.;Long Term: Cholesterol controlled with medications as prescribed, with individualized exercise RX and with personalized nutrition plan. Value goals: LDL < 67m, HDL > 40 mg.             Education:Diabetes - Individual verbal and written instruction to review signs/symptoms of diabetes, desired ranges of glucose level fasting, after meals and with exercise. Acknowledge that pre and post exercise glucose checks will be done for 3 sessions at entry of  program. [place] [facility]  Education need identified 08/19/21  Date 08/19/21  Educator [place] Instruction Review Code 1- Verbalizes Understanding       Core Components/Risk Factors/Patient Goals Review:   Goals and Risk Factor Review     Row Name 09/28/21 1120 12/01/21 1406           Core Components/Risk Factors/Patient Goals Review   Personal Goals Review Weight Management/Obesity;Hypertension;Diabetes Weight Management/Obesity;Diabetes      Review GRajis doing well in rehab. His weight has snuck up over Thanksgiving, but it is coming back down again.  His pressures are doing well and he keeps an eye on it at home.  His sugars have been up and down in class and at home.  He has stopped his glipizide and that seems to help, but still having some issues.  He will continue to keep a close eye on his sugars. GLanorrishas been checking his sugar at home every other day. He states that his sugar has been good. He states that his weight is the best it has been in years. GTrongwould not mind being 175 lbs.      Expected Outcomes Short: Continue to work on blood sugar control Long: Continue to improve diabetes management Short: work on weight loss. Lonbg: reach a goal weight of 175lbs.               Core Components/Risk Factors/Patient Goals at Discharge (Final Review):   Goals and Risk Factor Review - 12/01/21 1406       Core Components/Risk Factors/Patient Goals Review   Personal Goals Review Weight Management/Obesity;Diabetes    Review GJoreyhas been checking his sugar at home every other day. He states that his sugar has been good. He states that his weight is the best it has been in years. GKaniwould not mind being 175 lbs.    Expected Outcomes Short: work on weight loss. Lonbg: reach a goal weight of 175lbs.             ITP Comments:  ITP Comments     Row Name 08/02/21 1534 08/19/21 1410 08/19/21 1411 08/24/21 1117  08/26/21 1107   ITP Comments Initial telephone orientation completed. [facility] EP orientation scheduled for Thursday 11/3 at 10am. Completed 6MWT and gym orientation. Initial ITP created and sent for review to Dr. MEmily Filbert Medical Director. EKG strips sent to Dr. BEdwin Dada Spoke with Dr. BEdwin Dadaon the phone, per MD- Patient strips are OK, he is pacing. No concern with anything else, patient OK to start exercise. First full day of exercise!  Patient was oriented to gym and equipment including functions, settings, policies, and procedures.  Patient's individual exercise prescription and treatment plan were reviewed.  All starting workloads were established based on the results of the 6 minute walk test done at initial orientation visit.  The plan for exercise progression was also introduced and progression will be customized based on patient's  performance and goals.    Row Name 09/08/21 1430 09/15/21 0624 10/13/21 0644 10/20/21 1301 11/04/21 1416   ITP Comments Completed initial RD consultation 30 Day review completed. Medical Director ITP review done, changes made as directed, and signed approval by Medical Director. 30 Day review completed. Medical Director ITP review done, changes made as directed, and signed approval by Medical Director. Patient called to let us know that his doctor wants him to stop rehab and start PT for his back. Staff will place patient on medical hold until we hear back on how long he may need PT for. If PT lasts a longer time, may possibly need to discharge and get new referral  when appropriate and cleared. Patient will call and keep [place] updated when he starts PT. Patient in hospital for PAD. Will attempt to follow up with patient after to see how long he needs PT for and if we need to keep patient on medical hold or discharge.    [place] Name 11/10/21 0817 12/08/21 0740         ITP Comments 30 Day review completed. Medical Director ITP review done,  changes made as directed, and signed approval by Medical Director.   No visit this month medical reason 30 Day review completed. Medical Director ITP review done, changes made as directed, and signed approval by Medical Director.               Comments:

## 2021-12-09 ENCOUNTER — Other Ambulatory Visit: Payer: Self-pay

## 2021-12-09 ENCOUNTER — Encounter: Payer: BC Managed Care – PPO | Admitting: *Deleted

## 2021-12-09 DIAGNOSIS — I252 Old myocardial infarction: Secondary | ICD-10-CM | POA: Diagnosis not present

## 2021-12-09 DIAGNOSIS — I214 Non-ST elevation (NSTEMI) myocardial infarction: Secondary | ICD-10-CM

## 2021-12-09 DIAGNOSIS — Z9861 Coronary angioplasty status: Secondary | ICD-10-CM | POA: Diagnosis not present

## 2021-12-09 NOTE — Progress Notes (Signed)
Daily Session Note  Patient Details  Name: FILBERT CRAZE MRN: [id number] Date of Birth: Apr 15, 1951 Referring Provider:   Flowsheet Row Cardiac Rehab from 08/19/2021 in [facility] Cardiac and Pulmonary Rehab  Referring Provider Cloretta Ned MD       Encounter Date: 12/09/2021  Check In:  Session Check In - 12/09/21 1108       Check-In   Supervising physician immediately available to respond to emergencies See telemetry face sheet for immediately available ER MD    Location [facility]-Cardiac & Pulmonary Rehab    Staff Present Renita Papa, RN BSN;Joseph Tessie Fass, RCP,RRT,BSRT;Kelly Wickliffe, MPA, RN;Melissa [place], [place], LDN    Virtual Visit No    Medication changes reported     No    Fall or balance concerns reported    No    Warm-up and Cool-down Performed on first and last piece of equipment    Resistance Training Performed Yes    VAD Patient? No    PAD/SET Patient? No      Pain Assessment   Currently in Pain? No/denies                Social History   Tobacco Use  Smoking Status Former   Packs/day: 2.00   Years: 40.00   Pack years: 80.00   Types: Cigarettes   Quit date: 11/27/2010   Years since quitting: 11.0  Smokeless Tobacco Never  Tobacco Comments   Quit in 2012    Goals Met:  Independence with exercise equipment Exercise tolerated well No report of concerns or symptoms today Strength training completed today  Goals Unmet:  Not Applicable  Comments: Pt able to follow exercise prescription today without complaint.  Will continue to monitor for progression.    Dr. Emily Filbert is Medical Director for [facility].  Dr. Ottie Glazier is Medical Director for [facility] Pulmonary Rehabilitation.

## 2021-12-10 DIAGNOSIS — I739 Peripheral vascular disease, unspecified: Secondary | ICD-10-CM | POA: Diagnosis not present

## 2021-12-10 DIAGNOSIS — I4901 Ventricular fibrillation: Secondary | ICD-10-CM | POA: Diagnosis not present

## 2021-12-10 DIAGNOSIS — Z951 Presence of aortocoronary bypass graft: Secondary | ICD-10-CM | POA: Diagnosis not present

## 2021-12-10 DIAGNOSIS — E782 Mixed hyperlipidemia: Secondary | ICD-10-CM | POA: Diagnosis not present

## 2021-12-10 DIAGNOSIS — Z8674 Personal history of sudden cardiac arrest: Secondary | ICD-10-CM | POA: Diagnosis not present

## 2021-12-10 DIAGNOSIS — I10 Essential (primary) hypertension: Secondary | ICD-10-CM | POA: Diagnosis not present

## 2021-12-10 DIAGNOSIS — I5022 Chronic systolic (congestive) heart failure: Secondary | ICD-10-CM | POA: Diagnosis not present

## 2021-12-13 ENCOUNTER — Telehealth: Payer: Self-pay | Admitting: *Deleted

## 2021-12-13 ENCOUNTER — Encounter: Payer: Self-pay | Admitting: *Deleted

## 2021-12-13 DIAGNOSIS — Z9861 Coronary angioplasty status: Secondary | ICD-10-CM

## 2021-12-13 NOTE — Telephone Encounter (Signed)
Damon English called to let us know that he is scheduled for a cath on Thursday 3/2.  He will be out for this week and will let us know about report.

## 2021-12-16 DIAGNOSIS — I25110 Atherosclerotic heart disease of native coronary artery with unstable angina pectoris: Secondary | ICD-10-CM | POA: Diagnosis not present

## 2021-12-16 DIAGNOSIS — I2582 Chronic total occlusion of coronary artery: Secondary | ICD-10-CM | POA: Diagnosis not present

## 2021-12-16 DIAGNOSIS — Z8674 Personal history of sudden cardiac arrest: Secondary | ICD-10-CM | POA: Diagnosis not present

## 2021-12-16 DIAGNOSIS — Z955 Presence of coronary angioplasty implant and graft: Secondary | ICD-10-CM | POA: Diagnosis not present

## 2021-12-16 DIAGNOSIS — Z951 Presence of aortocoronary bypass graft: Secondary | ICD-10-CM | POA: Diagnosis not present

## 2021-12-16 DIAGNOSIS — Z7901 Long term (current) use of anticoagulants: Secondary | ICD-10-CM | POA: Diagnosis not present

## 2021-12-16 DIAGNOSIS — E782 Mixed hyperlipidemia: Secondary | ICD-10-CM | POA: Diagnosis not present

## 2021-12-16 DIAGNOSIS — I509 Heart failure, unspecified: Secondary | ICD-10-CM | POA: Diagnosis not present

## 2021-12-16 DIAGNOSIS — I25720 Atherosclerosis of autologous artery coronary artery bypass graft(s) with unstable angina pectoris: Secondary | ICD-10-CM | POA: Diagnosis not present

## 2021-12-16 DIAGNOSIS — Z7902 Long term (current) use of antithrombotics/antiplatelets: Secondary | ICD-10-CM | POA: Diagnosis not present

## 2021-12-16 DIAGNOSIS — Z86711 Personal history of pulmonary embolism: Secondary | ICD-10-CM | POA: Diagnosis not present

## 2021-12-16 DIAGNOSIS — I255 Ischemic cardiomyopathy: Secondary | ICD-10-CM | POA: Diagnosis not present

## 2021-12-16 DIAGNOSIS — I739 Peripheral vascular disease, unspecified: Secondary | ICD-10-CM | POA: Diagnosis not present

## 2021-12-16 DIAGNOSIS — I5030 Unspecified diastolic (congestive) heart failure: Secondary | ICD-10-CM | POA: Diagnosis not present

## 2021-12-16 DIAGNOSIS — Z79899 Other long term (current) drug therapy: Secondary | ICD-10-CM | POA: Diagnosis not present

## 2021-12-16 DIAGNOSIS — Z950 Presence of cardiac pacemaker: Secondary | ICD-10-CM | POA: Diagnosis not present

## 2021-12-16 DIAGNOSIS — I2510 Atherosclerotic heart disease of native coronary artery without angina pectoris: Secondary | ICD-10-CM | POA: Diagnosis not present

## 2021-12-17 DIAGNOSIS — Z955 Presence of coronary angioplasty implant and graft: Secondary | ICD-10-CM | POA: Diagnosis not present

## 2021-12-17 DIAGNOSIS — I25720 Atherosclerosis of autologous artery coronary artery bypass graft(s) with unstable angina pectoris: Secondary | ICD-10-CM | POA: Diagnosis not present

## 2021-12-17 DIAGNOSIS — I739 Peripheral vascular disease, unspecified: Secondary | ICD-10-CM | POA: Diagnosis not present

## 2021-12-17 DIAGNOSIS — I25110 Atherosclerotic heart disease of native coronary artery with unstable angina pectoris: Secondary | ICD-10-CM | POA: Diagnosis not present

## 2021-12-17 DIAGNOSIS — E782 Mixed hyperlipidemia: Secondary | ICD-10-CM | POA: Diagnosis not present

## 2021-12-17 DIAGNOSIS — I2582 Chronic total occlusion of coronary artery: Secondary | ICD-10-CM | POA: Diagnosis not present

## 2021-12-17 DIAGNOSIS — Z951 Presence of aortocoronary bypass graft: Secondary | ICD-10-CM | POA: Diagnosis not present

## 2021-12-17 DIAGNOSIS — Z86711 Personal history of pulmonary embolism: Secondary | ICD-10-CM | POA: Diagnosis not present

## 2021-12-17 DIAGNOSIS — I2510 Atherosclerotic heart disease of native coronary artery without angina pectoris: Secondary | ICD-10-CM | POA: Diagnosis not present

## 2021-12-17 DIAGNOSIS — Z8674 Personal history of sudden cardiac arrest: Secondary | ICD-10-CM | POA: Diagnosis not present

## 2021-12-17 DIAGNOSIS — I5030 Unspecified diastolic (congestive) heart failure: Secondary | ICD-10-CM | POA: Diagnosis not present

## 2021-12-17 DIAGNOSIS — Z7902 Long term (current) use of antithrombotics/antiplatelets: Secondary | ICD-10-CM | POA: Diagnosis not present

## 2021-12-17 DIAGNOSIS — Z7901 Long term (current) use of anticoagulants: Secondary | ICD-10-CM | POA: Diagnosis not present

## 2021-12-17 DIAGNOSIS — Z950 Presence of cardiac pacemaker: Secondary | ICD-10-CM | POA: Diagnosis not present

## 2021-12-17 DIAGNOSIS — Z79899 Other long term (current) drug therapy: Secondary | ICD-10-CM | POA: Diagnosis not present

## 2021-12-21 ENCOUNTER — Encounter: Payer: BC Managed Care – PPO | Attending: Cardiovascular Disease

## 2021-12-21 DIAGNOSIS — Z9861 Coronary angioplasty status: Secondary | ICD-10-CM | POA: Insufficient documentation

## 2021-12-21 DIAGNOSIS — I214 Non-ST elevation (NSTEMI) myocardial infarction: Secondary | ICD-10-CM | POA: Insufficient documentation

## 2021-12-22 ENCOUNTER — Telehealth: Payer: Self-pay

## 2021-12-22 NOTE — Progress Notes (Signed)
Damon English has not returned to class yet ?

## 2021-12-22 NOTE — Telephone Encounter (Signed)
LMOM

## 2021-12-23 ENCOUNTER — Telehealth: Payer: Self-pay

## 2021-12-23 ENCOUNTER — Other Ambulatory Visit: Payer: Self-pay

## 2021-12-23 ENCOUNTER — Encounter: Payer: BC Managed Care – PPO | Admitting: *Deleted

## 2021-12-23 DIAGNOSIS — Z9861 Coronary angioplasty status: Secondary | ICD-10-CM

## 2021-12-23 DIAGNOSIS — I214 Non-ST elevation (NSTEMI) myocardial infarction: Secondary | ICD-10-CM | POA: Diagnosis not present

## 2021-12-23 NOTE — Progress Notes (Signed)
Daily Session Note ? ?Patient Details  ?Name: Damon English ?MRN: [id number] ?Date of Birth: 05-18-1951 ?Referring Provider:   ?Flowsheet Row Cardiac Rehab from 08/19/2021 in [facility] Cardiac and Pulmonary Rehab  ?Referring Provider Cloretta Ned MD  ? ?  ? ? ?Encounter Date: 12/23/2021 ? ?Check In: ? Session Check In - 12/23/21 1105   ? ?  ? Check-In  ? Supervising physician immediately available to respond to emergencies See telemetry face sheet for immediately available ER MD   ? Location [facility]-Cardiac & Pulmonary Rehab   ? Staff Present Renita Papa, RN BSN;Joseph [place], RCP,RRT,BSRT;Jessica [place], [place], [place], [place], CCET   ? Virtual Visit No   ? Medication changes reported     No   ? Fall or balance concerns reported    No   ? Warm-up and Cool-down Performed on first and last piece of equipment   ? Resistance Training Performed Yes   ? VAD Patient? No   ? PAD/SET Patient? No   ?  ? Pain Assessment  ? Currently in Pain? No/denies   ? ?  ?  ? ?  ? ? ? ? ? ?Social History  ? ?Tobacco Use  ?Smoking Status Former  ? Packs/day: 2.00  ? Years: 40.00  ? Pack years: 80.00  ? Types: Cigarettes  ? Quit date: 11/27/2010  ? Years since quitting: 11.0  ?Smokeless Tobacco Never  ?Tobacco Comments  ? Quit in 2012  ? ? ?Goals Met:  ?Independence with exercise equipment ?Exercise tolerated well ?No report of concerns or symptoms today ?Strength training completed today ? ?Goals Unmet:  ?Not Applicable ? ?Comments: Pt able to follow exercise prescription today without complaint.  Will continue to monitor for progression. ? ? ? ?Dr. Emily Filbert is Medical Director for [facility].  ?Dr. Ottie Glazier is Medical Director for [facility] Pulmonary Rehabilitation. ?

## 2021-12-23 NOTE — Telephone Encounter (Signed)
Kuper says he is cleared for exercise and plans to attend today  ?

## 2021-12-28 ENCOUNTER — Other Ambulatory Visit: Payer: Self-pay

## 2021-12-28 DIAGNOSIS — I214 Non-ST elevation (NSTEMI) myocardial infarction: Secondary | ICD-10-CM

## 2021-12-28 DIAGNOSIS — Z9861 Coronary angioplasty status: Secondary | ICD-10-CM | POA: Diagnosis not present

## 2021-12-28 NOTE — Progress Notes (Signed)
Daily Session Note ? ?Patient Details  ?Name: Damon English ?MRN: [id number] ?Date of Birth: 1951/10/08 ?Referring Provider:   ?Flowsheet Row Cardiac Rehab from 08/19/2021 in [facility] Cardiac and Pulmonary Rehab  ?Referring Provider Cloretta Ned MD  ? ?  ? ? ?Encounter Date: 12/28/2021 ? ?Check In: ? Session Check In - 12/28/21 1106   ? ?  ? Check-In  ? Supervising physician immediately available to respond to emergencies See telemetry face sheet for immediately available ER MD   ? Location [facility]-Cardiac & Pulmonary Rehab   ? Staff Present Birdie Sons, MPA, RN;Amanda Sommer, BA, ACSM CEP, Exercise Physiologist;Jessica [place], MA, RCEP, CCRP, CCET;Melissa [place], RDN, LDN   ? Virtual Visit No   ? Medication changes reported     No   ? Fall or balance concerns reported    No   ? Warm-up and Cool-down Performed on first and last piece of equipment   ? Resistance Training Performed Yes   ? VAD Patient? No   ? PAD/SET Patient? No   ?  ? Pain Assessment  ? Currently in Pain? No/denies   ? ?  ?  ? ?  ? ? ? ? ? ?Social History  ? ?Tobacco Use  ?Smoking Status Former  ? Packs/day: 2.00  ? Years: 40.00  ? Pack years: 80.00  ? Types: Cigarettes  ? Quit date: 11/27/2010  ? Years since quitting: 11.0  ?Smokeless Tobacco Never  ?Tobacco Comments  ? Quit in 2012  ? ? ?Goals Met:  ?Independence with exercise equipment ?Exercise tolerated well ?No report of concerns or symptoms today ?Strength training completed today ? ?Goals Unmet:  ?Not Applicable ? ?Comments: Pt able to follow exercise prescription today without complaint.  Will continue to monitor for progression. ? ? ? ?Dr. Emily Filbert is Medical Director for [place].  ?Dr. Ottie Glazier is Medical Director for [facility] Pulmonary Rehabilitation. ?

## 2021-12-30 ENCOUNTER — Other Ambulatory Visit: Payer: Self-pay

## 2021-12-30 DIAGNOSIS — Z9861 Coronary angioplasty status: Secondary | ICD-10-CM | POA: Diagnosis not present

## 2021-12-30 DIAGNOSIS — I214 Non-ST elevation (NSTEMI) myocardial infarction: Secondary | ICD-10-CM | POA: Diagnosis not present

## 2021-12-30 NOTE — Progress Notes (Signed)
Daily Session Note ? ?Patient Details  ?Name: Damon English ?MRN: [id number] ?Date of Birth: 1951-06-11 ?Referring Provider:   ?Flowsheet Row Cardiac Rehab from 08/19/2021 in [facility] Cardiac and Pulmonary Rehab  ?Referring Provider Cloretta Ned MD  ? ?  ? ? ?Encounter Date: 12/30/2021 ? ?Check In: ? Session Check In - 12/30/21 1105   ? ?  ? Check-In  ? Supervising physician immediately available to respond to emergencies See telemetry face sheet for immediately available ER MD   ? Location [facility]-Cardiac & Pulmonary Rehab   ? Staff Present Birdie Sons, MPA, RN;Meredith Sherryll Burger, RN BSN;Jessica [place], MA, RCEP, CCRP, CCET   ? Virtual Visit No   ? Medication changes reported     No   ? Fall or balance concerns reported    No   ? Warm-up and Cool-down Performed on first and last piece of equipment   ? Resistance Training Performed Yes   ? VAD Patient? No   ? PAD/SET Patient? No   ?  ? Pain Assessment  ? Currently in Pain? No/denies   ? ?  ?  ? ?  ? ? ? ? ? ?Social History  ? ?Tobacco Use  ?Smoking Status Former  ? Packs/day: 2.00  ? Years: 40.00  ? Pack years: 80.00  ? Types: Cigarettes  ? Quit date: 11/27/2010  ? Years since quitting: 11.0  ?Smokeless Tobacco Never  ?Tobacco Comments  ? Quit in 2012  ? ? ?Goals Met:  ?Independence with exercise equipment ?Exercise tolerated well ?No report of concerns or symptoms today ?Strength training completed today ? ?Goals Unmet:  ?Not Applicable ? ?Comments: Pt able to follow exercise prescription today without complaint.  Will continue to monitor for progression. ? ? ? ? ?Dr. Emily Filbert is Medical Director for [place].  ?Dr. Ottie Glazier is Medical Director for [facility] Pulmonary Rehabilitation. ?

## 2022-01-04 ENCOUNTER — Other Ambulatory Visit: Payer: Self-pay

## 2022-01-04 DIAGNOSIS — Z9861 Coronary angioplasty status: Secondary | ICD-10-CM | POA: Diagnosis not present

## 2022-01-04 DIAGNOSIS — I214 Non-ST elevation (NSTEMI) myocardial infarction: Secondary | ICD-10-CM | POA: Diagnosis not present

## 2022-01-04 NOTE — Progress Notes (Signed)
Daily Session Note ? ?Patient Details  ?Name: Damon English ?MRN: [id number] ?Date of Birth: [birth year] ?Referring Provider:   ?Flowsheet Row Cardiac Rehab from 08/19/2021 in [facility] Cardiac and Pulmonary Rehab  ?Referring Provider Cloretta Ned MD  ? ?  ? ? ?Encounter Date: 01/04/2022 ? ?Check In: ? Session Check In - 01/04/22 1110   ? ?  ? Check-In  ? Supervising physician immediately available to respond to emergencies See telemetry face sheet for immediately available ER MD   ? Location [facility]-Cardiac & Pulmonary Rehab   ? Staff Present Birdie Sons, MPA, RN;Amanda Sommer, BA, ACSM CEP, Exercise Physiologist;Melissa [place], RDN, LDN;Jessica [place], MA, RCEP, CCRP, CCET   ? Virtual Visit No   ? Medication changes reported     No   ? Fall or balance concerns reported    No   ? Warm-up and Cool-down Performed on first and last piece of equipment   ? Resistance Training Performed Yes   ? VAD Patient? No   ? PAD/SET Patient? No   ?  ? Pain Assessment  ? Currently in Pain? No/denies   ? ?  ?  ? ?  ? ? ? ? ? ?Social History  ? ?Tobacco Use  ?Smoking Status Former  ? Packs/day: 2.00  ? Years: 40.00  ? Pack years: 80.00  ? Types: Cigarettes  ? Quit date: 11/27/2010  ? Years since quitting: 11.1  ?Smokeless Tobacco Never  ?Tobacco Comments  ? Quit in 2012  ? ? ?Goals Met:  ?Independence with exercise equipment ?Exercise tolerated well ?No report of concerns or symptoms today ?Strength training completed today ? ?Goals Unmet:  ?Not Applicable ? ?Comments: Pt able to follow exercise prescription today without complaint.  Will continue to monitor for progression. ? ? ? ?Dr. Emily Filbert is Medical Director for [place].  ?Dr. Ottie Glazier is Medical Director for [facility] Pulmonary Rehabilitation. ?

## 2022-01-05 ENCOUNTER — Encounter: Payer: Self-pay | Admitting: *Deleted

## 2022-01-05 DIAGNOSIS — Z9861 Coronary angioplasty status: Secondary | ICD-10-CM

## 2022-01-05 NOTE — Progress Notes (Signed)
Cardiac Individual Treatment Plan ? ?Patient Details  ?Name: Damon English ?MRN: [id number] ?Date of Birth: Dec 05, 1950 ?Referring Provider:   ?Flowsheet Row Cardiac Rehab from 08/19/2021 in [facility] Cardiac and Pulmonary Rehab  ?Referring Provider Cloretta Ned MD  ? ?  ? ? ?Initial Encounter Date:  ?Flowsheet Row Cardiac Rehab from 08/19/2021 in [facility] Cardiac and Pulmonary Rehab  ?Date 08/19/21  ? ?  ? ? ?Visit Diagnosis: S/P PTCA (percutaneous transluminal coronary angioplasty) ? ?Patient's Home Medications on Admission: ? ?Current Outpatient Medications:  ?  acetaminophen (TYLENOL) 325 MG tablet, Take 2 tablets (650 mg total) by mouth every 6 (six) hours as needed for fever., Disp: 20 tablet, Rfl: 0 ?  acidophilus (RISAQUAD) CAPS capsule, Take 2 capsules by mouth 3 (three) times daily., Disp: 30 capsule, Rfl: 0 ?  apixaban (ELIQUIS) 5 MG TABS tablet, Take by mouth., Disp: , Rfl:  ?  atorvastatin (LIPITOR) 80 MG tablet, Take 80 mg by mouth daily., Disp: , Rfl:  ?  brimonidine (ALPHAGAN) 0.2 % ophthalmic solution, Place 1 drop into both eyes 2 (two) times daily., Disp: 5 mL, Rfl: 12 ?  Calcipotriene-Betameth Diprop (ENSTILAR) 0.005-0.064 % FOAM, , Disp: , Rfl:  ?  Chlorhexidine Gluconate Cloth 2 % PADS, Apply 6 each topically daily., Disp: 1 each, Rfl: 0 ?  clonazePAM (KLONOPIN) 0.25 MG disintegrating tablet, Take 1-2 tablets (0.25-0.5 mg total) by mouth 3 (three) times daily as needed (anxiety/agitation)., Disp: 20 tablet, Rfl: 0 ?  clopidogrel (PLAVIX) 75 MG tablet, Take 1 tablet (75 mg total) by mouth daily., Disp: 30 tablet, Rfl: 0 ?  diclofenac Sodium (VOLTAREN) 1 % GEL, Apply 4 g topically 4 (four) times daily., Disp: , Rfl:  ?  docusate sodium (COLACE) 100 MG capsule, Take 1 capsule (100 mg total) by mouth 2 (two) times daily., Disp: 10 capsule, Rfl: 0 ?  DULoxetine (CYMBALTA) 60 MG capsule, Take 1 capsule (60 mg total) by mouth 2 (two) times daily., Disp: 20 capsule, Rfl: 0 ?  esomeprazole (NEXIUM) 40 MG  capsule, Take 1 capsule by mouth daily., Disp: , Rfl:  ?  famotidine (PEPCID) 40 MG tablet, Take 1 tablet by mouth every morning., Disp: , Rfl:  ?  ferrous sulfate 325 (65 FE) MG EC tablet, Take 1 tablet by mouth daily with breakfast., Disp: , Rfl:  ?  folic acid (FOLVITE) 1 MG tablet, Take 1 tablet (1 mg total) by mouth daily., Disp: 30 tablet, Rfl: 0 ?  furosemide (LASIX) 20 MG tablet, Take 20 mg by mouth daily as needed (Edema (Or weight gain of >2lbs overnight or 5lbs in one week))., Disp: , Rfl:  ?  glipiZIDE (GLUCOTROL) 10 MG tablet, Take 5 mg by mouth daily before breakfast., Disp: , Rfl:  ?  guaiFENesin (MUCINEX) 600 MG 12 hr tablet, Take 1 tablet (600 mg total) by mouth 2 (two) times daily., Disp: 30 tablet, Rfl: 0 ?  isosorbide mononitrate (IMDUR) 30 MG 24 hr tablet, Take 1 tablet (30 mg total) by mouth daily., Disp: 30 tablet, Rfl: 0 ?  ketoconazole (NIZORAL) 2 % shampoo, Apply topically., Disp: , Rfl:  ?  latanoprost (XALATAN) 0.005 % ophthalmic solution, Place 1 drop into both eyes at bedtime., Disp: 2.5 mL, Rfl: 12 ?  losartan (COZAAR) 25 MG tablet, Take 25 mg by mouth daily., Disp: , Rfl:  ?  Magnesium 250 MG TABS, Take by mouth., Disp: , Rfl:  ?  metoprolol succinate (TOPROL-XL) 25 MG 24 hr tablet, Take 1 tablet by  mouth at bedtime., Disp: , Rfl:  ?  Multiple Vitamin (MULTIVITAMIN WITH MINERALS) TABS tablet, Take 1 tablet by mouth daily., Disp: 30 tablet, Rfl: 0 ?  nitroGLYCERIN (NITROSTAT) 0.4 MG SL tablet, Place 1 tablet (0.4 mg total) under the tongue every 5 (five) minutes as needed for chest pain., Disp: 20 tablet, Rfl: 12 ?  omeprazole (PRILOSEC) 40 MG capsule, Take 40 mg by mouth daily., Disp: , Rfl:  ?  ondansetron (ZOFRAN) 4 MG/2ML SOLN injection, Inject 2 mLs (4 mg total) into the vein every 6 (six) hours as needed for nausea., Disp: 2 mL, Rfl: 0 ?  phenol (CHLORASEPTIC) 1.4 % LIQD, Use as directed 1 spray in the mouth or throat as needed for throat irritation / pain., Disp: 10 mL, Rfl: 0 ?   polyethylene glycol (MIRALAX / GLYCOLAX) 17 g packet, Take 17 g by mouth daily as needed for mild constipation or moderate constipation., Disp: 14 each, Rfl: 0 ?  potassium chloride SA (KLOR-CON) 20 MEQ tablet, Take by mouth. 6 tablets, Disp: , Rfl:  ?  ranolazine (RANEXA) 500 MG 12 hr tablet, Take 500 mg by mouth 2 (two) times daily., Disp: , Rfl:  ?  SENNOSIDES PO, Take by mouth., Disp: , Rfl:  ?  spironolactone (ALDACTONE) 25 MG tablet, Take 12.5 mg by mouth., Disp: , Rfl:  ?  sucralfate (CARAFATE) 1 g tablet, Take 1 tablet by mouth 4 (four) times daily -  before meals and at bedtime., Disp: , Rfl:  ?  thiamine 100 MG tablet, Take 1 tablet (100 mg total) by mouth daily., Disp: 30 tablet, Rfl: 0 ? ?Past Medical History: ?Past Medical History:  ?Diagnosis Date  ? Alcohol use   ? "cutting back" but still heavy and daily, multiple shots of bourbon each night  ? Diabetes mellitus without complication ([place])   ? History of tobacco use   ? reportedly quit in 2013  ? Hypertension   ? Pulmonary embolism ([place]) 2012  ? unprovoked  ? PVD (peripheral vascular disease) ([place])   ? ? ?Tobacco Use: ?Social History  ? ?Tobacco Use  ?Smoking Status Former  ? Packs/day: 2.00  ? Years: 40.00  ? Pack years: 80.00  ? Types: Cigarettes  ? Quit date: 11/27/2010  ? Years since quitting: 11.1  ?Smokeless Tobacco Never  ?Tobacco Comments  ? Quit in 2012  ? ? ?Labs: ?Recent Review Flowsheet Data   ? ? Labs for [facility] Cardiac and Pulmonary Rehab Latest Ref Rng & Units 01/28/2019 01/29/2019 06/18/2021 06/18/2021 06/18/2021  ? Cholestrol 0 - 200 mg/dL 168 143 - - -  ? LDLCALC 0 - 99 mg/dL 113(H) 93 - - -  ? HDL >40 mg/dL 36(L) 33(L) - - -  ? Trlycerides <150 mg/dL 97 87 - - -  ? Hemoglobin A1c 4.8 - 5.6 % - - - - 6.1(H)  ? PHART 7.350 - 7.450 - - 7.58(H) 7.48(H) -  ? PCO2ART 32.0 - 48.0 mmHg - - 28(L) 47 -  ? HCO3 20.0 - 28.0 mmol/L - - 26.3 35.0(H) -  ? O2SAT % - - 99.9 99.0 -  ? ?  ? ? ? ?Exercise Target Goals: ?Exercise Program Goal: ?Individual exercise  prescription set using results from initial 6 min walk test and THRR while considering  patient?s activity barriers and safety.  ? ?Exercise Prescription Goal: ?Initial exercise prescription builds to 30-45 minutes a day of aerobic activity, 2-3 days per week.  Home exercise guidelines will be given to  patient during program as part of exercise prescription that the participant will acknowledge. ? ? ?Education: Aerobic Exercise: ?- Group verbal and visual presentation on the components of exercise prescription. Introduces F.I.T.T principle from ACSM for exercise prescriptions.  Reviews F.I.T.T. principles of aerobic exercise including progression. Written material given at graduation. ?Flowsheet Row Cardiac Rehab from 09/19/2019 in [facility] Cardiac and Pulmonary Rehab  ?Date 08/22/19  ?Educator jh  ?Instruction Review Code 1- Verbalizes Understanding  ? ?  ? ? ?Education: Resistance Exercise: ?- Group verbal and visual presentation on the components of exercise prescription. Introduces F.I.T.T principle from ACSM for exercise prescriptions  Reviews F.I.T.T. principles of resistance exercise including progression. Written material given at graduation. ? ?  ?Education: Exercise & Equipment Safety: ?- Individual verbal instruction and demonstration of equipment use and safety with use of the equipment. ?Flowsheet Row Cardiac Rehab from 12/01/2021 in [facility] Cardiac and Pulmonary Rehab  ?Date 08/19/21  ?Educator KL  ?Instruction Review Code 1- Verbalizes Understanding  ? ?  ? ? ?Education: Exercise Physiology & General Exercise Guidelines: ?- Group verbal and written instruction with models to review the exercise physiology of the cardiovascular system and associated critical values. Provides general exercise guidelines with specific guidelines to those with heart or lung disease.  ?Flowsheet Row Cardiac Rehab from 12/01/2021 in [facility] Cardiac and Pulmonary Rehab  ?Date 10/07/21  ?Educator [facility]  ?Instruction Review Code 1- Verbalizes  Understanding  ? ?  ? ? ?Education: Flexibility, Balance, Mind/Body Relaxation: ?- Group verbal and visual presentation with interactive activity on the components of exercise prescription. Introduces F.

## 2022-01-06 ENCOUNTER — Other Ambulatory Visit: Payer: Self-pay

## 2022-01-06 DIAGNOSIS — I214 Non-ST elevation (NSTEMI) myocardial infarction: Secondary | ICD-10-CM | POA: Diagnosis not present

## 2022-01-06 DIAGNOSIS — Z9861 Coronary angioplasty status: Secondary | ICD-10-CM | POA: Diagnosis not present

## 2022-01-06 NOTE — Progress Notes (Signed)
Daily Session Note ? ?Patient Details  ?Name: Damon English ?MRN: [id number] ?Date of Birth: [birth year] ?Referring Provider:   ?Flowsheet Row Cardiac Rehab from 08/19/2021 in [facility] Cardiac and Pulmonary Rehab  ?Referring Provider Cloretta Ned MD  ? ?  ? ? ?Encounter Date: 01/06/2022 ? ?Check In: ? Session Check In - 01/06/22 1120   ? ?  ? Check-In  ? Supervising physician immediately available to respond to emergencies See telemetry face sheet for immediately available ER MD   ? Location [facility]-Cardiac & Pulmonary Rehab   ? Staff Present Birdie Sons, MPA, RN;Meredith Sherryll Burger, RN BSN;Melissa [place], RDN, LDN   ? Virtual Visit No   ? Medication changes reported     No   ? Fall or balance concerns reported    No   ? Warm-up and Cool-down Performed on first and last piece of equipment   ? Resistance Training Performed Yes   ? VAD Patient? No   ? PAD/SET Patient? No   ?  ? Pain Assessment  ? Currently in Pain? No/denies   ? ?  ?  ? ?  ? ? ? ? ? ?Social History  ? ?Tobacco Use  ?Smoking Status Former  ? Packs/day: 2.00  ? Years: 40.00  ? Pack years: 80.00  ? Types: Cigarettes  ? Quit date: 11/27/2010  ? Years since quitting: 11.1  ?Smokeless Tobacco Never  ?Tobacco Comments  ? Quit in 2012  ? ? ?Goals Met:  ?Independence with exercise equipment ?Exercise tolerated well ?No report of concerns or symptoms today ?Strength training completed today ? ?Goals Unmet:  ?Not Applicable ? ?Comments: Pt able to follow exercise prescription today without complaint.  Will continue to monitor for progression. ? ? ? ?Dr. Emily Filbert is Medical Director for [place].  ?Dr. Ottie Glazier is Medical Director for [facility] Pulmonary Rehabilitation. ?

## 2022-01-11 ENCOUNTER — Other Ambulatory Visit: Payer: Self-pay

## 2022-01-11 DIAGNOSIS — Z9861 Coronary angioplasty status: Secondary | ICD-10-CM | POA: Diagnosis not present

## 2022-01-11 DIAGNOSIS — I214 Non-ST elevation (NSTEMI) myocardial infarction: Secondary | ICD-10-CM

## 2022-01-11 NOTE — Progress Notes (Signed)
Daily Session Note ? ?Patient Details  ?Name: Damon English ?MRN: [id number] ?Date of Birth: [birth year] ?Referring Provider:   ?Flowsheet Row Cardiac Rehab from 08/19/2021 in [facility] Cardiac and Pulmonary Rehab  ?Referring Provider Cloretta Ned MD  ? ?  ? ? ?Encounter Date: 01/11/2022 ? ?Check In: ? Session Check In - 01/11/22 1109   ? ?  ? Check-In  ? Supervising physician immediately available to respond to emergencies See telemetry face sheet for immediately available ER MD   ? Location [facility]-Cardiac & Pulmonary Rehab   ? Staff Present Birdie Sons, MPA, RN;Jessica [place], MA, RCEP, CCRP, CCET;Amanda Sommer, BA, ACSM CEP, Exercise Physiologist;Melissa [place], RDN, LDN   ? Virtual Visit No   ? Medication changes reported     No   ? Fall or balance concerns reported    No   ? Warm-up and Cool-down Performed on first and last piece of equipment   ? Resistance Training Performed Yes   ? VAD Patient? No   ? PAD/SET Patient? No   ?  ? Pain Assessment  ? Currently in Pain? No/denies   ? ?  ?  ? ?  ? ? ? ? ? ?Social History  ? ?Tobacco Use  ?Smoking Status Former  ? Packs/day: 2.00  ? Years: 40.00  ? Pack years: 80.00  ? Types: Cigarettes  ? Quit date: 11/27/2010  ? Years since quitting: 11.1  ?Smokeless Tobacco Never  ?Tobacco Comments  ? Quit in 2012  ? ? ?Goals Met:  ?Independence with exercise equipment ?Exercise tolerated well ?Personal goals reviewed ?No report of concerns or symptoms today ?Strength training completed today ? ?Goals Unmet:  ?Not Applicable ? ?Comments: Pt able to follow exercise prescription today without complaint.  Will continue to monitor for progression. ? ? ? ?Dr. Emily Filbert is Medical Director for [place].  ?Dr. Ottie Glazier is Medical Director for [facility] Pulmonary Rehabilitation. ?

## 2022-01-12 DIAGNOSIS — I25118 Atherosclerotic heart disease of native coronary artery with other forms of angina pectoris: Secondary | ICD-10-CM | POA: Diagnosis not present

## 2022-01-12 DIAGNOSIS — Z951 Presence of aortocoronary bypass graft: Secondary | ICD-10-CM | POA: Diagnosis not present

## 2022-01-12 DIAGNOSIS — I739 Peripheral vascular disease, unspecified: Secondary | ICD-10-CM | POA: Diagnosis not present

## 2022-01-12 DIAGNOSIS — I10 Essential (primary) hypertension: Secondary | ICD-10-CM | POA: Diagnosis not present

## 2022-01-13 DIAGNOSIS — Z9861 Coronary angioplasty status: Secondary | ICD-10-CM | POA: Diagnosis not present

## 2022-01-13 DIAGNOSIS — I214 Non-ST elevation (NSTEMI) myocardial infarction: Secondary | ICD-10-CM | POA: Diagnosis not present

## 2022-01-13 NOTE — Progress Notes (Signed)
Daily Session Note ? ?Patient Details  ?Name: Damon English ?MRN: [id number] ?Date of Birth: December 10, 1950 ?Referring Provider:   ?Flowsheet Row Cardiac Rehab from 08/19/2021 in [facility] Cardiac and Pulmonary Rehab  ?Referring Provider Cloretta Ned MD  ? ?  ? ? ?Encounter Date: 01/13/2022 ? ?Check In: ? Session Check In - 01/13/22 1108   ? ?  ? Check-In  ? Supervising physician immediately available to respond to emergencies See telemetry face sheet for immediately available ER MD   ? Location [facility]-Cardiac & Pulmonary Rehab   ? Staff Present Birdie Sons, MPA, RN;Melissa [place], RDN, LDN;Joseph Tessie Fass, Cristopher [place], RN BSN   ? Virtual Visit No   ? Medication changes reported     No   ? Fall or balance concerns reported    No   ? Warm-up and Cool-down Performed on first and last piece of equipment   ? Resistance Training Performed Yes   ? VAD Patient? No   ? PAD/SET Patient? No   ?  ? Pain Assessment  ? Currently in Pain? No/denies   ? ?  ?  ? ?  ? ? ? ? ? ?Social History  ? ?Tobacco Use  ?Smoking Status Former  ? Packs/day: 2.00  ? Years: 40.00  ? Pack years: 80.00  ? Types: Cigarettes  ? Quit date: 11/27/2010  ? Years since quitting: 11.1  ?Smokeless Tobacco Never  ?Tobacco Comments  ? Quit in 2012  ? ? ?Goals Met:  ?Independence with exercise equipment ?Exercise tolerated well ?No report of concerns or symptoms today ?Strength training completed today ? ?Goals Unmet:  ?Not Applicable ? ?Comments: Pt able to follow exercise prescription today without complaint.  Will continue to monitor for progression. ? ? ? ?Dr. Emily Filbert is Medical Director for [place].  ?Dr. Ottie Glazier is Medical Director for [facility] Pulmonary Rehabilitation. ?

## 2022-01-14 DIAGNOSIS — Z950 Presence of cardiac pacemaker: Secondary | ICD-10-CM | POA: Diagnosis not present

## 2022-01-15 DIAGNOSIS — Z950 Presence of cardiac pacemaker: Secondary | ICD-10-CM | POA: Diagnosis not present

## 2022-01-18 ENCOUNTER — Encounter (INDEPENDENT_AMBULATORY_CARE_PROVIDER_SITE_OTHER): Payer: BC Managed Care – PPO

## 2022-01-18 ENCOUNTER — Encounter: Payer: BC Managed Care – PPO | Attending: Cardiovascular Disease

## 2022-01-18 ENCOUNTER — Ambulatory Visit (INDEPENDENT_AMBULATORY_CARE_PROVIDER_SITE_OTHER): Payer: Medicare Other | Admitting: Vascular Surgery

## 2022-01-18 DIAGNOSIS — Z955 Presence of coronary angioplasty implant and graft: Secondary | ICD-10-CM | POA: Insufficient documentation

## 2022-01-18 DIAGNOSIS — Z9861 Coronary angioplasty status: Secondary | ICD-10-CM

## 2022-01-18 NOTE — Progress Notes (Signed)
Daily Session Note ? ?Patient Details  ?Name: Damon English ?MRN: [id number] ?Date of Birth: 29-Jul-1951 ?Referring Provider:   ?Flowsheet Row Cardiac Rehab from 08/19/2021 in [facility] Cardiac and Pulmonary Rehab  ?Referring Provider Cloretta Ned MD  ? ?  ? ? ?Encounter Date: 01/18/2022 ? ?Check In: ? Session Check In - 01/18/22 1101   ? ?  ? Check-In  ? Supervising physician immediately available to respond to emergencies See telemetry face sheet for immediately available ER MD   ? Location [facility]-Cardiac & Pulmonary Rehab   ? Staff Present Birdie Sons, MPA, RN;Amanda Sommer, BA, ACSM CEP, Exercise Physiologist;Jessica Luan Pulling, MA, RCEP, CCRP, CCET   ? Virtual Visit No   ? Medication changes reported     No   ? Fall or balance concerns reported    No   ? Warm-up and Cool-down Performed on first and last piece of equipment   ? Resistance Training Performed Yes   ? VAD Patient? No   ? PAD/SET Patient? No   ?  ? Pain Assessment  ? Currently in Pain? No/denies   ? ?  ?  ? ?  ? ? ? ? ? ?Social History  ? ?Tobacco Use  ?Smoking Status Former  ? Packs/day: 2.00  ? Years: 40.00  ? Pack years: 80.00  ? Types: Cigarettes  ? Quit date: 11/27/2010  ? Years since quitting: 11.1  ?Smokeless Tobacco Never  ?Tobacco Comments  ? Quit in 2012  ? ? ?Goals Met:  ?Independence with exercise equipment ?Exercise tolerated well ?No report of concerns or symptoms today ?Strength training completed today ? ?Goals Unmet:  ?Not Applicable ? ?Comments: Pt able to follow exercise prescription today without complaint.  Will continue to monitor for progression. ? ? ? ?Dr. Emily Filbert is Medical Director for [place].  ?Dr. Ottie Glazier is Medical Director for [facility] Pulmonary Rehabilitation. ?

## 2022-01-20 DIAGNOSIS — Z955 Presence of coronary angioplasty implant and graft: Secondary | ICD-10-CM | POA: Diagnosis not present

## 2022-01-20 DIAGNOSIS — Z9861 Coronary angioplasty status: Secondary | ICD-10-CM

## 2022-01-20 NOTE — Progress Notes (Signed)
Daily Session Note ? ?Patient Details  ?Name: Damon English ?MRN: [id number] ?Date of Birth: April 04, 1951 ?Referring Provider:   ?Flowsheet Row Cardiac Rehab from 08/19/2021 in [facility] Cardiac and Pulmonary Rehab  ?Referring Provider Cloretta Ned MD  ? ?  ? ? ?Encounter Date: 01/20/2022 ? ?Check In: ? Session Check In - 01/20/22 1035   ? ?  ? Check-In  ? Supervising physician immediately available to respond to emergencies See telemetry face sheet for immediately available ER MD   ? Location [facility]-Cardiac & Pulmonary Rehab   ? Staff Present Birdie Sons, MPA, RN;Melissa [place], RDN, LDN;Jessica [place], MA, RCEP, CCRP, CCET;Amanda Sommer, BA, ACSM CEP, Exercise Physiologist   ? Virtual Visit No   ? Medication changes reported     No   ? Fall or balance concerns reported    No   ? Warm-up and Cool-down Performed on first and last piece of equipment   ? Resistance Training Performed Yes   ? VAD Patient? No   ? PAD/SET Patient? No   ?  ? Pain Assessment  ? Currently in Pain? No/denies   ? ?  ?  ? ?  ? ? ? ? ? ?Social History  ? ?Tobacco Use  ?Smoking Status Former  ? Packs/day: 2.00  ? Years: 40.00  ? Pack years: 80.00  ? Types: Cigarettes  ? Quit date: 11/27/2010  ? Years since quitting: 11.1  ?Smokeless Tobacco Never  ?Tobacco Comments  ? Quit in 2012  ? ? ?Goals Met:  ?Independence with exercise equipment ?Exercise tolerated well ?No report of concerns or symptoms today ?Strength training completed today ? ?Goals Unmet:  ?Not Applicable ? ?Comments: Pt able to follow exercise prescription today without complaint.  Will continue to monitor for progression. ? ? ? ?Dr. Emily Filbert is Medical Director for [place].  ?Dr. Ottie Glazier is Medical Director for [facility] Pulmonary Rehabilitation. ?

## 2022-01-24 DIAGNOSIS — E119 Type 2 diabetes mellitus without complications: Secondary | ICD-10-CM | POA: Diagnosis not present

## 2022-01-24 DIAGNOSIS — Z79899 Other long term (current) drug therapy: Secondary | ICD-10-CM | POA: Diagnosis not present

## 2022-01-24 DIAGNOSIS — I739 Peripheral vascular disease, unspecified: Secondary | ICD-10-CM | POA: Diagnosis not present

## 2022-01-24 DIAGNOSIS — I2510 Atherosclerotic heart disease of native coronary artery without angina pectoris: Secondary | ICD-10-CM | POA: Diagnosis not present

## 2022-01-24 DIAGNOSIS — Z950 Presence of cardiac pacemaker: Secondary | ICD-10-CM | POA: Diagnosis not present

## 2022-01-27 ENCOUNTER — Encounter: Payer: BC Managed Care – PPO | Admitting: *Deleted

## 2022-01-27 DIAGNOSIS — I214 Non-ST elevation (NSTEMI) myocardial infarction: Secondary | ICD-10-CM

## 2022-01-27 DIAGNOSIS — Z9861 Coronary angioplasty status: Secondary | ICD-10-CM

## 2022-01-27 DIAGNOSIS — Z955 Presence of coronary angioplasty implant and graft: Secondary | ICD-10-CM | POA: Diagnosis not present

## 2022-01-27 NOTE — Progress Notes (Signed)
Daily Session Note ? ?Patient Details  ?Name: Damon English ?MRN: [id number] ?Date of Birth: Oct 26, 1950 ?Referring Provider:   ?Flowsheet Row Cardiac Rehab from 08/19/2021 in [facility] Cardiac and Pulmonary Rehab  ?Referring Provider Cloretta Ned MD  ? ?  ? ? ?Encounter Date: 01/27/2022 ? ?Check In: ? Session Check In - 01/27/22 1229   ? ?  ? Check-In  ? Supervising physician immediately available to respond to emergencies See telemetry face sheet for immediately available ER MD   ? Location [facility]-Cardiac & Pulmonary Rehab   ? Staff Present Nyoka Cowden, RN, BSN, Ardeth Sportsman, RDN, Tawanna Solo, MS, ASCM CEP, Exercise Physiologist;Meredith Sherryll Burger, RN BSN   ? Virtual Visit No   ? Medication changes reported     No   ? Fall or balance concerns reported    No   ? Tobacco Cessation No Change   ? Warm-up and Cool-down Performed on first and last piece of equipment   ? Resistance Training Performed Yes   ? VAD Patient? No   ? PAD/SET Patient? No   ?  ? Pain Assessment  ? Currently in Pain? No/denies   ? ?  ?  ? ?  ? ? ? ? ? ?Social History  ? ?Tobacco Use  ?Smoking Status Former  ? Packs/day: 2.00  ? Years: 40.00  ? Pack years: 80.00  ? Types: Cigarettes  ? Quit date: 11/27/2010  ? Years since quitting: 11.1  ?Smokeless Tobacco Never  ?Tobacco Comments  ? Quit in 2012  ? ? ?Goals Met:  ?Independence with exercise equipment ?Exercise tolerated well ?No report of concerns or symptoms today ? ?Goals Unmet:  ?Not Applicable ? ?Comments: Pt able to follow exercise prescription today without complaint.  Will continue to monitor for progression.  ? ? ?Dr. Emily Filbert is Medical Director for [place].  ?Dr. Ottie Glazier is Medical Director for [facility] Pulmonary Rehabilitation. ?

## 2022-02-01 VITALS — Ht 66.5 in | Wt 182.6 lb

## 2022-02-01 DIAGNOSIS — Z9861 Coronary angioplasty status: Secondary | ICD-10-CM

## 2022-02-01 DIAGNOSIS — Z955 Presence of coronary angioplasty implant and graft: Secondary | ICD-10-CM | POA: Diagnosis not present

## 2022-02-01 NOTE — Progress Notes (Signed)
Daily Session Note ? ?Patient Details  ?Name: Damon English ?MRN: [id number] ?Date of Birth: 1951/06/01 ?Referring Provider:   ?Flowsheet Row Cardiac Rehab from 08/19/2021 in [facility] Cardiac and Pulmonary Rehab  ?Referring Provider Cloretta Ned MD  ? ?  ? ? ?Encounter Date: 02/01/2022 ? ?Check In: ? Session Check In - 02/01/22 1109   ? ?  ? Check-In  ? Supervising physician immediately available to respond to emergencies See telemetry face sheet for immediately available ER MD   ? Location [facility]-Cardiac & Pulmonary Rehab   ? Staff Present Birdie Sons, MPA, RN;Amanda Sommer, BA, ACSM CEP, Exercise Physiologist;Jessica [place], MA, RCEP, CCRP, CCET;Melissa [place], RDN, LDN   ? Virtual Visit No   ? Medication changes reported     No   ? Fall or balance concerns reported    No   ? Tobacco Cessation No Change   ? Warm-up and Cool-down Performed on first and last piece of equipment   ? Resistance Training Performed Yes   ? VAD Patient? No   ? PAD/SET Patient? No   ?  ? Pain Assessment  ? Currently in Pain? No/denies   ? ?  ?  ? ?  ? ? ? ? ? ?Social History  ? ?Tobacco Use  ?Smoking Status Former  ? Packs/day: 2.00  ? Years: 40.00  ? Pack years: 80.00  ? Types: Cigarettes  ? Quit date: 11/27/2010  ? Years since quitting: 11.1  ?Smokeless Tobacco Never  ?Tobacco Comments  ? Quit in 2012  ? ? ?Goals Met:  ?Independence with exercise equipment ?Exercise tolerated well ?No report of concerns or symptoms today ?Strength training completed today ? ?Goals Unmet:  ?Not Applicable ? ?Comments: Pt able to follow exercise prescription today without complaint.  Will continue to monitor for progression. ? ? 6 Minute Walk   ? ? [place] Name 08/19/21 1000 02/01/22 1134  ?  ?  ? 6 Minute Walk  ? Phase Initial Discharge   ? Distance 590 feet 645 feet   ? Distance % Change -- 9.3 %   ? Distance Feet Change -- 55 ft   ? Walk Time 5 minutes 6 minutes   ? # of Rest Breaks 0 0   ? MPH 1.34 1.22   ? METS 1.78 1.81   ? RPE 15 15   ? Perceived  Dyspnea  1 --   ? VO2 Peak 6.23 6.32   ? Symptoms Yes (comment) Yes (comment)   ? Comments chest tightness 2/10, fatigue fatigue, legs sore   ? Resting HR 73 bpm 61 bpm   ? Resting BP 136/70 122/74   ? Resting Oxygen Saturation  99 % --   ? Exercise Oxygen Saturation  during 6 min walk 100 % --   ? Max Ex. HR 107 bpm 95 bpm   ? Max Ex. BP 148/76 146/78   ? 2 Minute Post BP 128/76 --   ? ?  ?  ? ?  ? ? ? ?Dr. Emily Filbert is Medical Director for [place].  ?Dr. Ottie Glazier is Medical Director for [facility] Pulmonary Rehabilitation. ?

## 2022-02-02 ENCOUNTER — Encounter: Payer: Self-pay | Admitting: *Deleted

## 2022-02-02 DIAGNOSIS — Z9861 Coronary angioplasty status: Secondary | ICD-10-CM

## 2022-02-02 NOTE — Progress Notes (Signed)
Cardiac Individual Treatment Plan ? ?Patient Details  ?Name: Damon English ?MRN: [id number] ?Date of Birth: 1950/10/25 ?Referring Provider:   ?Flowsheet Row Cardiac Rehab from 08/19/2021 in [facility] Cardiac and Pulmonary Rehab  ?Referring Provider Cloretta Ned MD  ? ?  ? ? ?Initial Encounter Date:  ?Flowsheet Row Cardiac Rehab from 08/19/2021 in [facility] Cardiac and Pulmonary Rehab  ?Date 08/19/21  ? ?  ? ? ?Visit Diagnosis: S/P PTCA (percutaneous transluminal coronary angioplasty) ? ?Patient's Home Medications on Admission: ? ?Current Outpatient Medications:  ?  acetaminophen (TYLENOL) 325 MG tablet, Take 2 tablets (650 mg total) by mouth every 6 (six) hours as needed for fever., Disp: 20 tablet, Rfl: 0 ?  acidophilus (RISAQUAD) CAPS capsule, Take 2 capsules by mouth 3 (three) times daily., Disp: 30 capsule, Rfl: 0 ?  apixaban (ELIQUIS) 5 MG TABS tablet, Take by mouth., Disp: , Rfl:  ?  atorvastatin (LIPITOR) 80 MG tablet, Take 80 mg by mouth daily., Disp: , Rfl:  ?  brimonidine (ALPHAGAN) 0.2 % ophthalmic solution, Place 1 drop into both eyes 2 (two) times daily., Disp: 5 mL, Rfl: 12 ?  Calcipotriene-Betameth Diprop (ENSTILAR) 0.005-0.064 % FOAM, , Disp: , Rfl:  ?  Chlorhexidine Gluconate Cloth 2 % PADS, Apply 6 each topically daily., Disp: 1 each, Rfl: 0 ?  clonazePAM (KLONOPIN) 0.25 MG disintegrating tablet, Take 1-2 tablets (0.25-0.5 mg total) by mouth 3 (three) times daily as needed (anxiety/agitation)., Disp: 20 tablet, Rfl: 0 ?  clopidogrel (PLAVIX) 75 MG tablet, Take 1 tablet (75 mg total) by mouth daily., Disp: 30 tablet, Rfl: 0 ?  diclofenac Sodium (VOLTAREN) 1 % GEL, Apply 4 g topically 4 (four) times daily., Disp: , Rfl:  ?  docusate sodium (COLACE) 100 MG capsule, Take 1 capsule (100 mg total) by mouth 2 (two) times daily., Disp: 10 capsule, Rfl: 0 ?  DULoxetine (CYMBALTA) 60 MG capsule, Take 1 capsule (60 mg total) by mouth 2 (two) times daily., Disp: 20 capsule, Rfl: 0 ?  esomeprazole (NEXIUM) 40 MG  capsule, Take 1 capsule by mouth daily., Disp: , Rfl:  ?  famotidine (PEPCID) 40 MG tablet, Take 1 tablet by mouth every morning., Disp: , Rfl:  ?  ferrous sulfate 325 (65 FE) MG EC tablet, Take 1 tablet by mouth daily with breakfast., Disp: , Rfl:  ?  folic acid (FOLVITE) 1 MG tablet, Take 1 tablet (1 mg total) by mouth daily., Disp: 30 tablet, Rfl: 0 ?  furosemide (LASIX) 20 MG tablet, Take 20 mg by mouth daily as needed (Edema (Or weight gain of >2lbs overnight or 5lbs in one week))., Disp: , Rfl:  ?  glipiZIDE (GLUCOTROL) 10 MG tablet, Take 5 mg by mouth daily before breakfast., Disp: , Rfl:  ?  guaiFENesin (MUCINEX) 600 MG 12 hr tablet, Take 1 tablet (600 mg total) by mouth 2 (two) times daily., Disp: 30 tablet, Rfl: 0 ?  isosorbide mononitrate (IMDUR) 30 MG 24 hr tablet, Take 1 tablet (30 mg total) by mouth daily., Disp: 30 tablet, Rfl: 0 ?  ketoconazole (NIZORAL) 2 % shampoo, Apply topically., Disp: , Rfl:  ?  latanoprost (XALATAN) 0.005 % ophthalmic solution, Place 1 drop into both eyes at bedtime., Disp: 2.5 mL, Rfl: 12 ?  losartan (COZAAR) 25 MG tablet, Take 25 mg by mouth daily., Disp: , Rfl:  ?  Magnesium 250 MG TABS, Take by mouth., Disp: , Rfl:  ?  metoprolol succinate (TOPROL-XL) 25 MG 24 hr tablet, Take 1 tablet by  mouth at bedtime., Disp: , Rfl:  ?  Multiple Vitamin (MULTIVITAMIN WITH MINERALS) TABS tablet, Take 1 tablet by mouth daily., Disp: 30 tablet, Rfl: 0 ?  nitroGLYCERIN (NITROSTAT) 0.4 MG SL tablet, Place 1 tablet (0.4 mg total) under the tongue every 5 (five) minutes as needed for chest pain., Disp: 20 tablet, Rfl: 12 ?  omeprazole (PRILOSEC) 40 MG capsule, Take 40 mg by mouth daily., Disp: , Rfl:  ?  ondansetron (ZOFRAN) 4 MG/2ML SOLN injection, Inject 2 mLs (4 mg total) into the vein every 6 (six) hours as needed for nausea., Disp: 2 mL, Rfl: 0 ?  phenol (CHLORASEPTIC) 1.4 % LIQD, Use as directed 1 spray in the mouth or throat as needed for throat irritation / pain., Disp: 10 mL, Rfl: 0 ?   polyethylene glycol (MIRALAX / GLYCOLAX) 17 g packet, Take 17 g by mouth daily as needed for mild constipation or moderate constipation., Disp: 14 each, Rfl: 0 ?  potassium chloride SA (KLOR-CON) 20 MEQ tablet, Take by mouth. 6 tablets, Disp: , Rfl:  ?  ranolazine (RANEXA) 500 MG 12 hr tablet, Take 500 mg by mouth 2 (two) times daily., Disp: , Rfl:  ?  SENNOSIDES PO, Take by mouth., Disp: , Rfl:  ?  spironolactone (ALDACTONE) 25 MG tablet, Take 12.5 mg by mouth., Disp: , Rfl:  ?  sucralfate (CARAFATE) 1 g tablet, Take 1 tablet by mouth 4 (four) times daily -  before meals and at bedtime., Disp: , Rfl:  ?  thiamine 100 MG tablet, Take 1 tablet (100 mg total) by mouth daily., Disp: 30 tablet, Rfl: 0 ? ?Past Medical History: ?Past Medical History:  ?Diagnosis Date  ? Alcohol use   ? "cutting back" but still heavy and daily, multiple shots of bourbon each night  ? Diabetes mellitus without complication ([place])   ? History of tobacco use   ? reportedly quit in 2013  ? Hypertension   ? Pulmonary embolism ([place]) 2012  ? unprovoked  ? PVD (peripheral vascular disease) ([place])   ? ? ?Tobacco Use: ?Social History  ? ?Tobacco Use  ?Smoking Status Former  ? Packs/day: 2.00  ? Years: 40.00  ? Pack years: 80.00  ? Types: Cigarettes  ? Quit date: 11/27/2010  ? Years since quitting: 11.1  ?Smokeless Tobacco Never  ?Tobacco Comments  ? Quit in 2012  ? ? ?Labs: ?Review Flowsheet   ? ?  ?  Latest Ref Rng & Units 01/28/2019 01/29/2019 06/18/2021  ?Labs for [facility] Cardiac and Pulmonary Rehab  ?Cholestrol 0 - 200 mg/dL 168   143     ?LDL (calc) 0 - 99 mg/dL 113   93     ?HDL-C >40 mg/dL 36   33     ?Trlycerides <150 mg/dL 97   87     ?Hemoglobin A1c 4.8 - 5.6 %   6.1    ?PH, Arterial 7.350 - 7.450   7.48    ? 7.58    ?PCO2 arterial 32.0 - 48.0 mmHg   47    ? 28    ?Bicarbonate 20.0 - 28.0 mmol/L   35.0    ? 26.3    ?O2 Saturation %   99.0    ? 99.9    ?  ? ? Multiple values from one day are sorted in reverse-chronological order  ?  ?   ? ? ? ?Exercise Target Goals: ?Exercise Program Goal: ?Individual exercise prescription set using results from initial 6 min walk test  and THRR while considering  patient?s activity barriers and safety.  ? ?Exercise Prescription Goal: ?Initial exercise prescription builds to 30-45 minutes a day of aerobic activity, 2-3 days per week.  Home exercise guidelines will be given to patient during program as part of exercise prescription that the participant will acknowledge. ? ? ?Education: Aerobic Exercise: ?- Group verbal and visual presentation on the components of exercise prescription. Introduces F.I.T.T principle from ACSM for exercise prescriptions.  Reviews F.I.T.T. principles of aerobic exercise including progression. Written material given at graduation. ?Flowsheet Row Cardiac Rehab from 09/19/2019 in [facility] Cardiac and Pulmonary Rehab  ?Date 08/22/19  ?Educator jh  ?Instruction Review Code 1- Verbalizes Understanding  ? ?  ? ? ?Education: Resistance Exercise: ?- Group verbal and visual presentation on the components of exercise prescription. Introduces F.I.T.T principle from ACSM for exercise prescriptions  Reviews F.I.T.T. principles of resistance exercise including progression. Written material given at graduation. ? ?  ?Education: Exercise & Equipment Safety: ?- Individual verbal instruction and demonstration of equipment use and safety with use of the equipment. ?Flowsheet Row Cardiac Rehab from 01/27/2022 in [facility] Cardiac and Pulmonary Rehab  ?Date 08/19/21  ?Educator [place]  ?Instruction Review Code 1- Verbalizes Understanding  ? ?  ? ? ?Education: Exercise Physiology & General Exercise Guidelines: ?- Group verbal and written instruction with models to review the exercise physiology of the cardiovascular system and associated critical values. Provides general exercise guidelines with specific guidelines to those with heart or lung disease.  ?Flowsheet Row Cardiac Rehab from 01/27/2022 in [facility] Cardiac and Pulmonary  Rehab  ?Date 10/07/21  ?Educator [facility]  ?Instruction Review Code 1- Verbalizes Understanding  ? ?  ? ? ?Education: Flexibility, Balance, Mind/Body Relaxation: ?- Group verbal and visual presentation with interactive [place]

## 2022-02-03 DIAGNOSIS — Z9861 Coronary angioplasty status: Secondary | ICD-10-CM

## 2022-02-03 DIAGNOSIS — Z955 Presence of coronary angioplasty implant and graft: Secondary | ICD-10-CM | POA: Diagnosis not present

## 2022-02-03 NOTE — Progress Notes (Signed)
Daily Session Note ? ?Patient Details  ?Name: Damon English ?MRN: [id number] ?Date of Birth: 04/10/1951 ?Referring Provider:   ?Flowsheet Row Cardiac Rehab from 08/19/2021 in [facility] Cardiac and Pulmonary Rehab  ?Referring Provider Cloretta Ned MD  ? ?  ? ? ?Encounter Date: 02/03/2022 ? ?Check In: ? Session Check In - 02/03/22 1100   ? ?  ? Check-In  ? Supervising physician immediately available to respond to emergencies See telemetry face sheet for immediately available ER MD   ? Location [facility]-Cardiac & Pulmonary Rehab   ? Staff Present Birdie Sons, MPA, RN;Joseph Tessie Fass, Lindell Spar, BA, ACSM CEP, Exercise Physiologist;Melissa [place], RDN, LDN   ? Virtual Visit No   ? Medication changes reported     No   ? Fall or balance concerns reported    No   ? Tobacco Cessation No Change   ? Warm-up and Cool-down Performed on first and last piece of equipment   ? Resistance Training Performed Yes   ? VAD Patient? No   ? PAD/SET Patient? No   ?  ? Pain Assessment  ? Currently in Pain? No/denies   ? ?  ?  ? ?  ? ? ? ? ? ?Social History  ? ?Tobacco Use  ?Smoking Status Former  ? Packs/day: 2.00  ? Years: 40.00  ? Pack years: 80.00  ? Types: Cigarettes  ? Quit date: 11/27/2010  ? Years since quitting: 11.1  ?Smokeless Tobacco Never  ?Tobacco Comments  ? Quit in 2012  ? ? ?Goals Met:  ?Independence with exercise equipment ?Exercise tolerated well ?No report of concerns or symptoms today ?Strength training completed today ? ?Goals Unmet:  ?Not Applicable ? ?Comments: Pt able to follow exercise prescription today without complaint.  Will continue to monitor for progression. ? ? ? ?Dr. Emily Filbert is Medical Director for [place].  ?Dr. Ottie Glazier is Medical Director for [facility] Pulmonary Rehabilitation. ?

## 2022-02-03 NOTE — Patient Instructions (Signed)
Discharge Patient Instructions ? ?Patient Details  ?Name: Damon English ?MRN: [id number] ?Date of Birth: 1951/05/31 ?Referring Provider:  Lolita Patella, MD ? ? ?Number of Visits: 64 ? ?Reason for Discharge:  ?Patient reached a stable level of exercise. ?Patient independent in their exercise. ?Patient has met program and personal goals. ? ?Smoking History:  ?Social History  ? ?Tobacco Use  ?Smoking Status Former  ? Packs/day: 2.00  ? Years: 40.00  ? Pack years: 80.00  ? Types: Cigarettes  ? Quit date: 11/27/2010  ? Years since quitting: 11.1  ?Smokeless Tobacco Never  ?Tobacco Comments  ? Quit in 2012  ? ? ?Diagnosis:  ?S/P PTCA (percutaneous transluminal coronary angioplasty) ? ?Initial Exercise Prescription: ? Initial Exercise Prescription - 08/19/21 1400   ? ?  ? Date of Initial Exercise RX and Referring Provider  ? Date 08/19/21   ? Referring Provider Cloretta Ned MD   ?  ? Oxygen  ? Maintain Oxygen Saturation 88% or higher   ?  ? Recumbant Bike  ? Level 1   ? RPM 60   ? Minutes 15   ? METs 1.7   ?  ? NuStep  ? Level 1   ? SPM 80   ? Minutes 15   ? METs 1.7   ?  ? Biostep-RELP  ? Level 1   ? SPM 50   ? Minutes 15   ? METs 1.7   ?  ? Track  ? Laps 5   as tolerated  ? Minutes 15   ? METs 1.2   ?  ? Prescription Details  ? Frequency (times per week) 2   ? Duration Progress to 30 minutes of continuous aerobic without signs/symptoms of physical distress   ?  ? Intensity  ? THRR 40-80% of Max Heartrate 104-136   ? Ratings of Perceived Exertion 11-15   ? Perceived Dyspnea 0-4   ?  ? Resistance Training  ? Training Prescription Yes   ? Weight 3 lb   ROM on left  ? Reps 10-15   ? ?  ?  ? ?  ? ? ?Discharge Exercise Prescription (Final Exercise Prescription Changes): ? Exercise Prescription Changes - 02/01/22 0900   ? ?  ? Response to Exercise  ? Blood Pressure (Admit) 132/64   ? Blood Pressure (Exit) 110/60   ? Heart Rate (Admit) 79 bpm   ? Heart Rate (Exercise) 123 bpm   ? Heart Rate (Exit) 68 bpm   ? Rating of  Perceived Exertion (Exercise) 14   ? Symptoms none   ? Duration Continue with 30 min of aerobic exercise without signs/symptoms of physical distress.   ? Intensity THRR unchanged   ?  ? Progression  ? Progression Continue to progress workloads to maintain intensity without signs/symptoms of physical distress.   ? Average METs 1.6   ?  ? Resistance Training  ? Training Prescription Yes   ? Weight 4 lb   ? Reps 10-15   ?  ? Interval Training  ? Interval Training No   ?  ? NuStep  ? Level 6   ? Minutes 30   ? METs 2.1   ?  ? REL-XR  ? Level 1   ? Minutes 30   ? METs 1.1   ?  ? Home Exercise Plan  ? Plans to continue exercise at Home (comment)   walking, videos  ? Frequency Add 2 additional days to program exercise sessions.   ?  Initial Home Exercises Provided 10/07/21   ?  ? Oxygen  ? Maintain Oxygen Saturation 88% or higher   ? ?  ?  ? ?  ? ? ?Functional Capacity: ? 6 Minute Walk   ? ? [place] Name 08/19/21 1000 02/01/22 1134  ?  ?  ? 6 Minute Walk  ? Phase Initial Discharge   ? Distance 590 feet 645 feet   ? Distance % Change -- 9.3 %   ? Distance Feet Change -- 55 ft   ? Walk Time 5 minutes 6 minutes   ? # of Rest Breaks 0 0   ? MPH 1.34 1.22   ? METS 1.78 1.81   ? RPE 15 15   ? Perceived Dyspnea  1 --   ? VO2 Peak 6.23 6.32   ? Symptoms Yes (comment) Yes (comment)   ? Comments chest tightness 2/10, fatigue fatigue, legs sore   ? Resting HR 73 bpm 61 bpm   ? Resting BP 136/70 122/74   ? Resting Oxygen Saturation  99 % --   ? Exercise Oxygen Saturation  during 6 min walk 100 % --   ? Max Ex. HR 107 bpm 95 bpm   ? Max Ex. BP 148/76 146/78   ? 2 Minute Post BP 128/76 --   ? ?  ?  ? ?  ? ? ? ? ? ? ?  ? ? ?Goals reviewed with patient; copy given to patient. ?

## 2022-02-06 IMAGING — DX DG CHEST 1V PORT
1 series · 1 of 1 positions shown · non-contrast
Comparison: 07/11/2021, 06/21/2021

CLINICAL DATA: Status post temporary pacemaker placement

EXAM:
PORTABLE CHEST 1 VIEW

[chest ap]
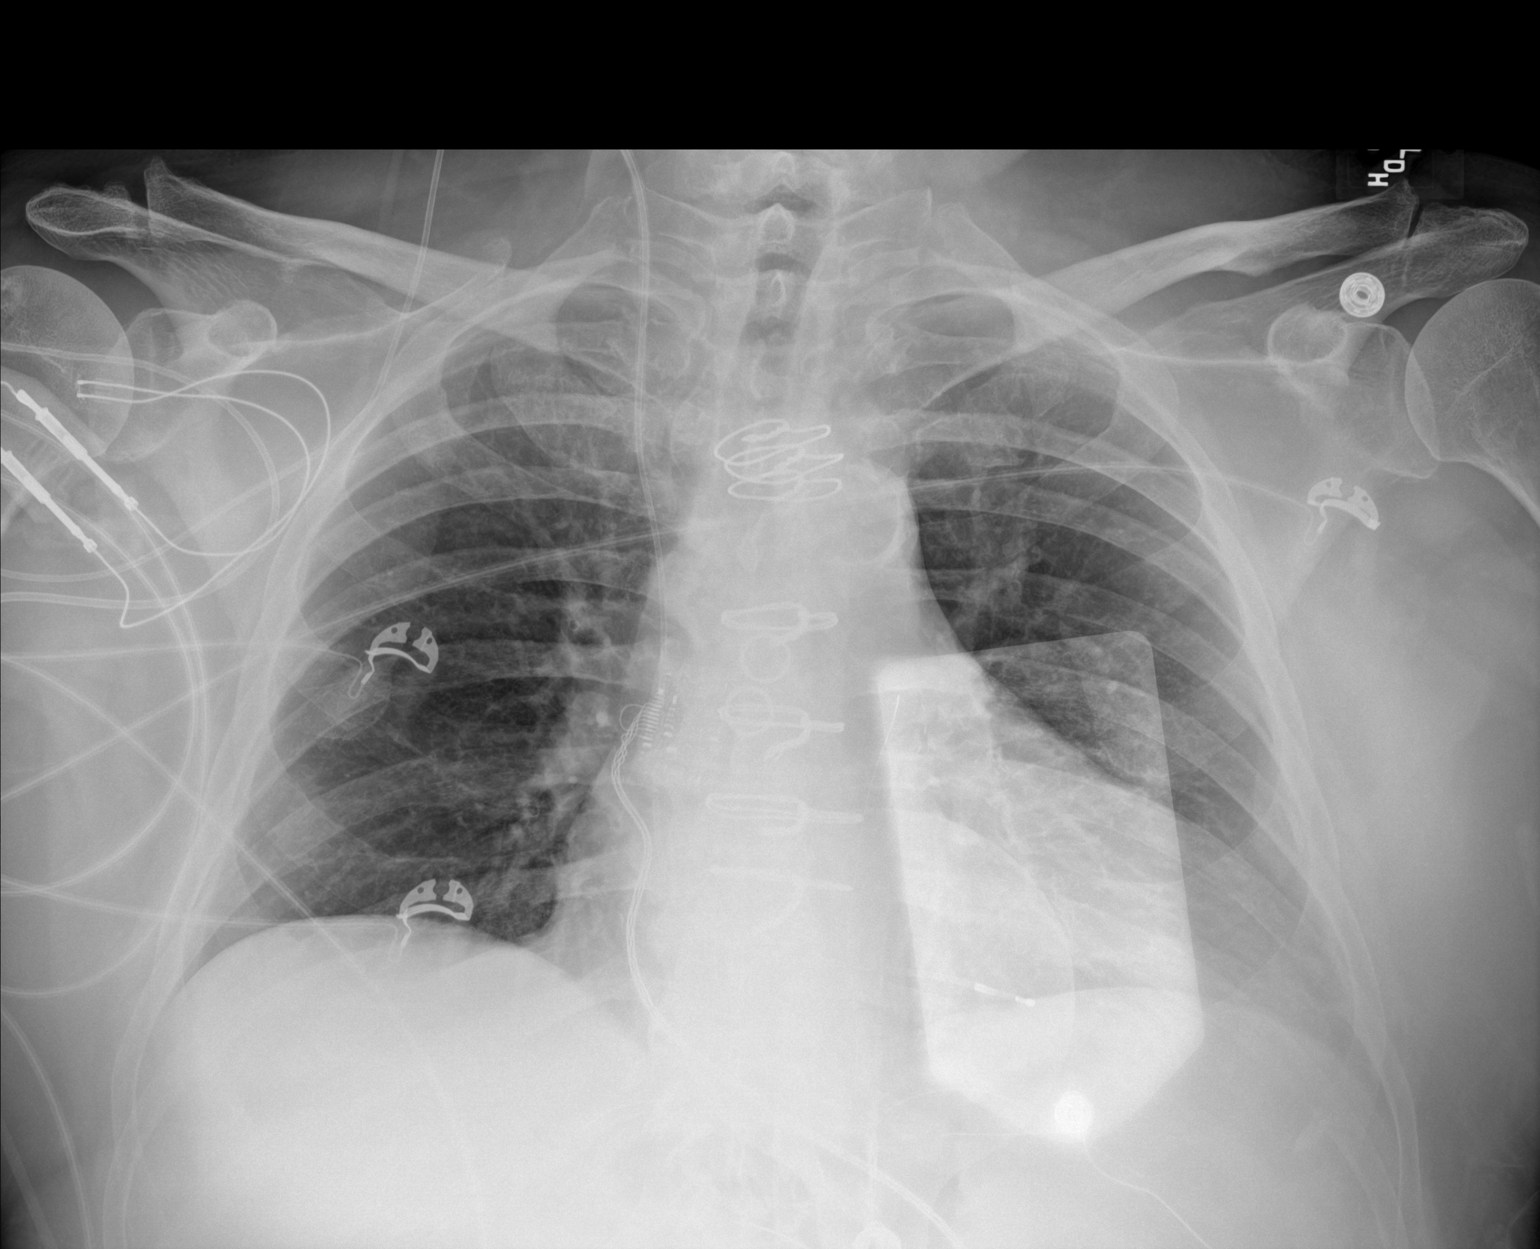

[1 of 1 positions shown; findings below may reference images not displayed]

FINDINGS: Cardiomegaly with aortic atherosclerosis. Status post sternotomy.
Temporary pacing patches over the lower chest. Interim placement of
right IJ pacer with lead projecting over right ventricle. No visible
pneumothorax. The lung fields are clear
IMPRESSION: 1. Interim placement of right IJ pacing lead with tip projecting
over right ventricle. No visible pneumothorax
2. Cardiomegaly.  Clear lung fields.

## 2022-02-06 IMAGING — DX DG CHEST 1V PORT
1 series · 2 of 2 positions shown · non-contrast
Comparison: 06/21/2021

CLINICAL DATA: Chest pain, syncopal episodes, bradycardia

EXAM:
PORTABLE CHEST 1 VIEW

[Series 1: chest ap · 0.14mm/px · 2 of 2 slices shown]
[im 1/2]
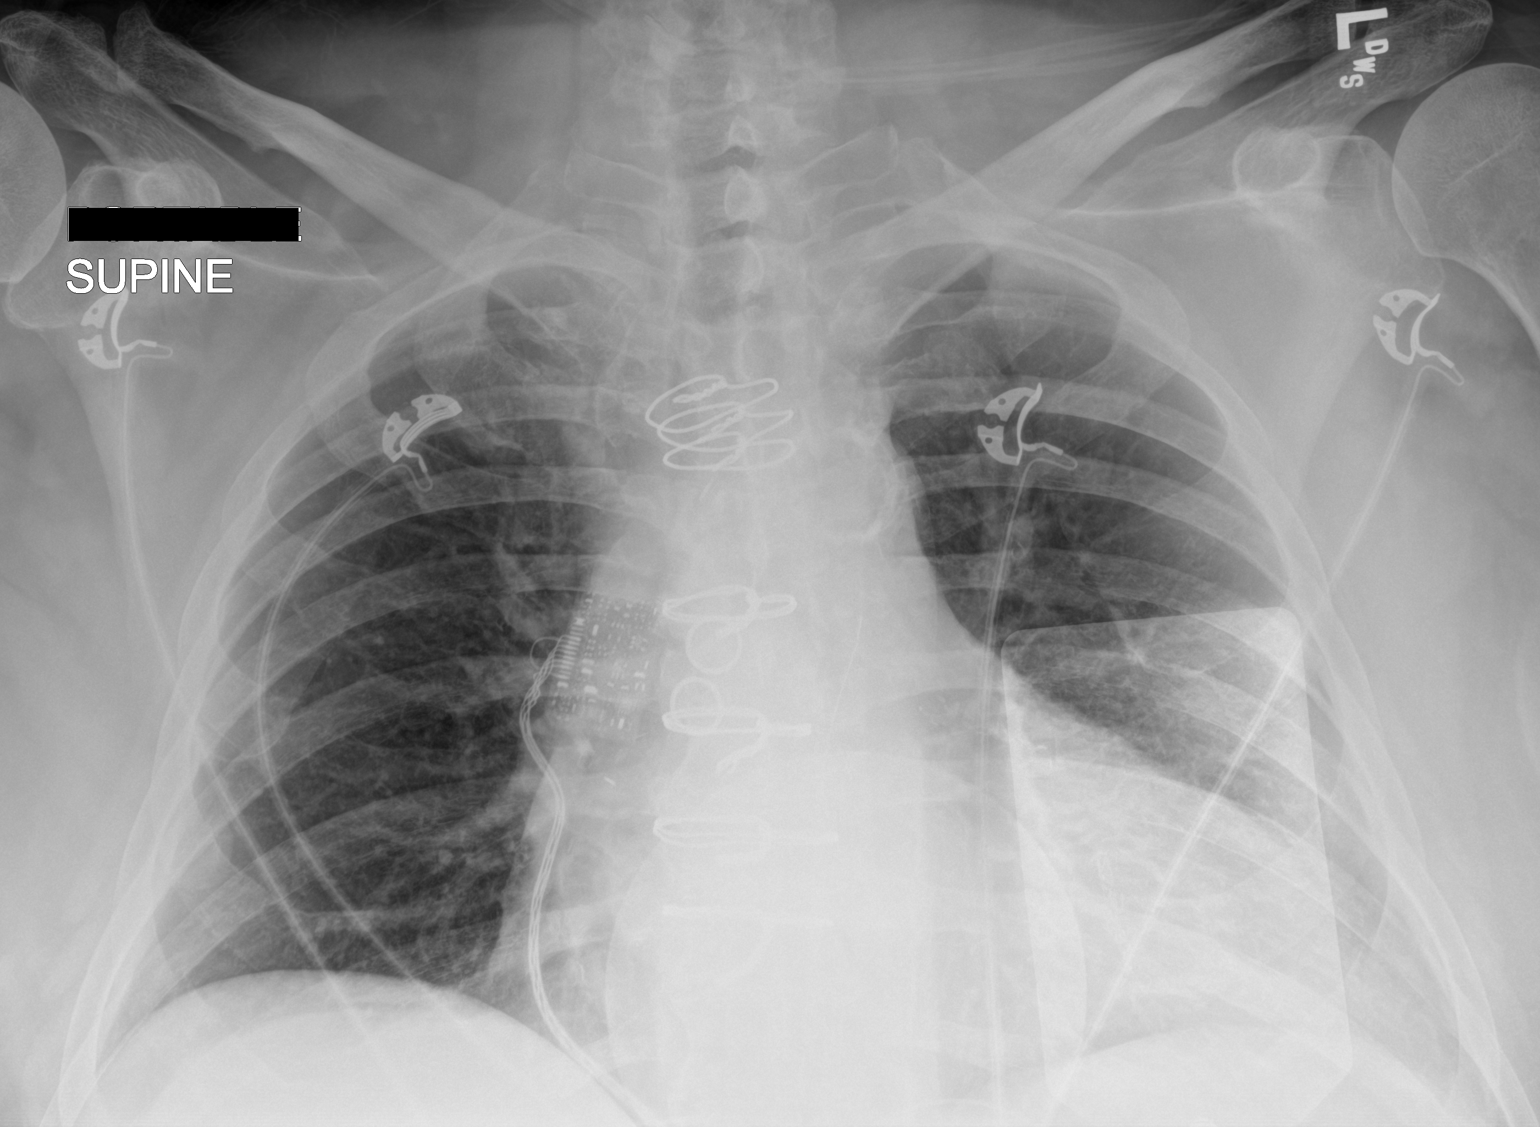
[im 2/2]
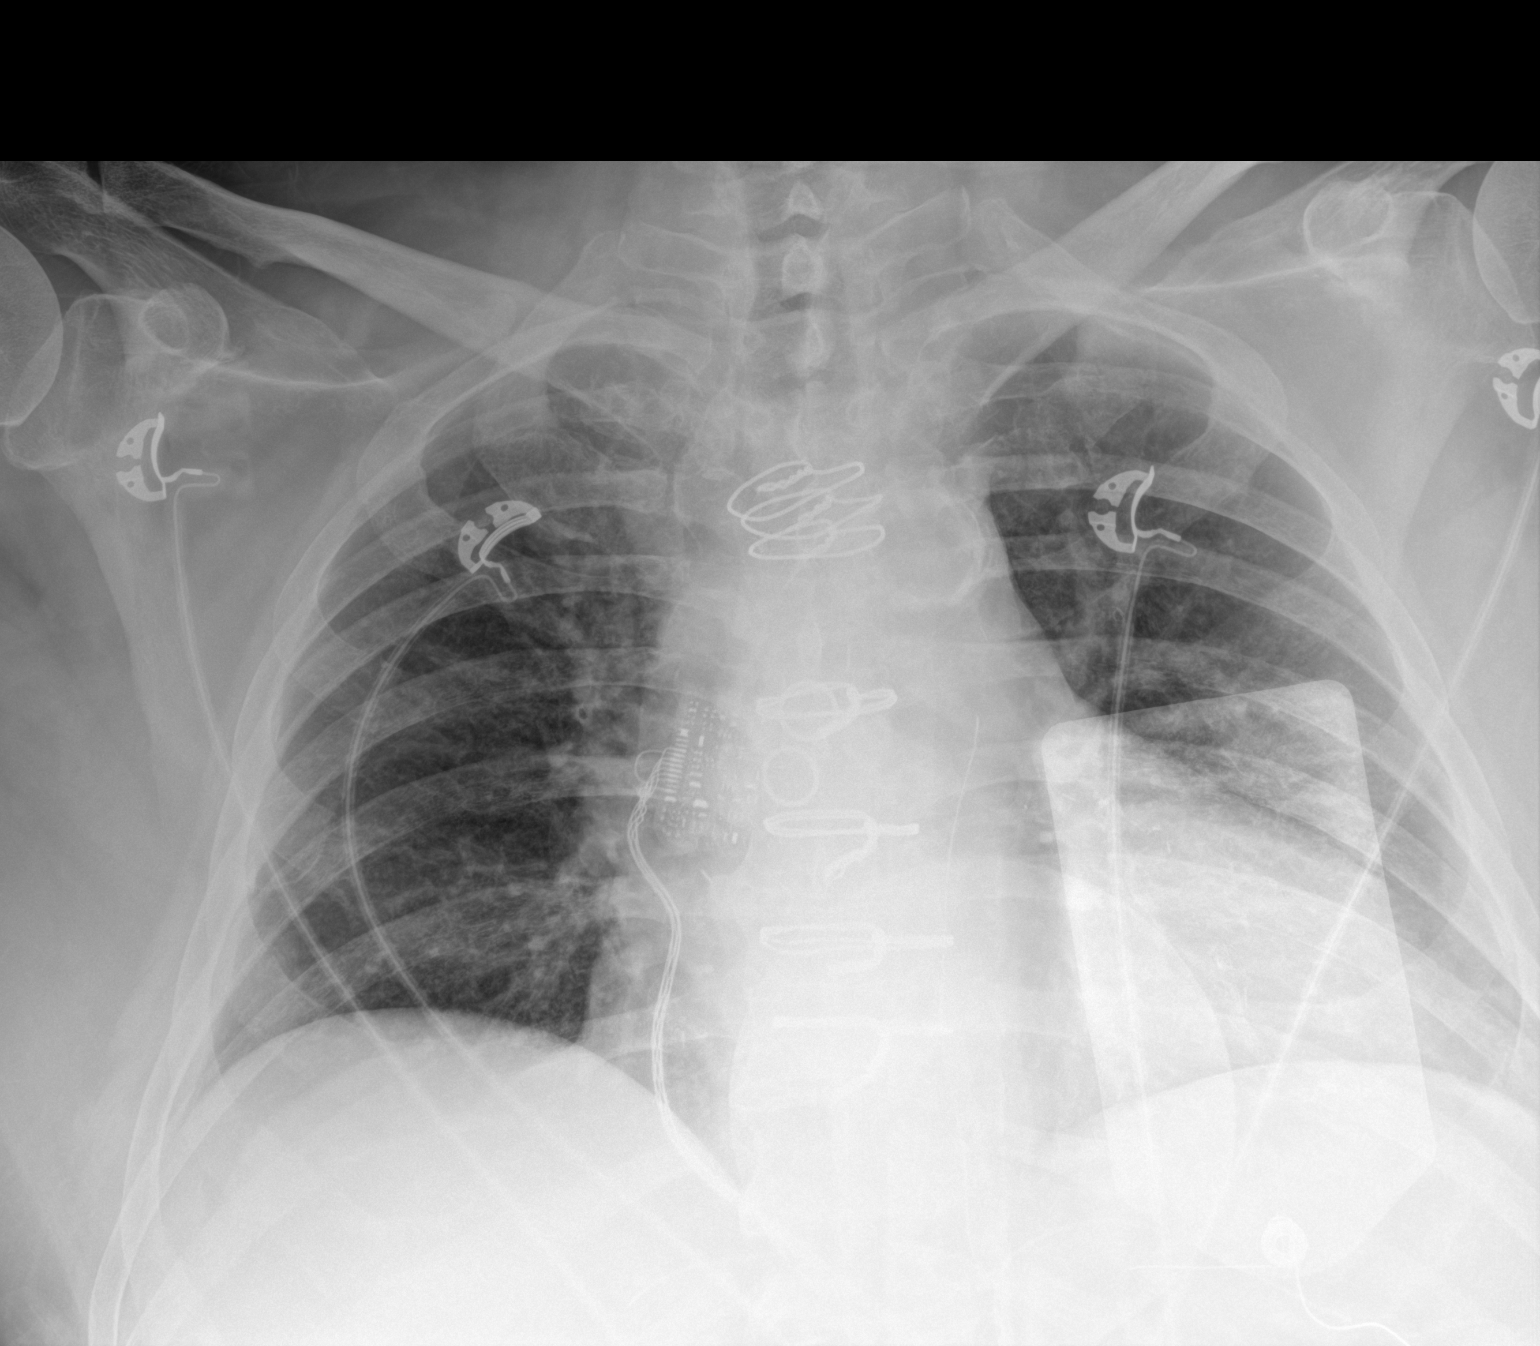

[2 of 2 positions shown; findings below may reference images not displayed]

FINDINGS: Gross cardiomegaly status post median sternotomy and CABG.
Defibrillator leads project over the chest. No acute abnormality of
the lungs.
IMPRESSION: Gross cardiomegaly status post median sternotomy and CABG. No acute
abnormality of the lungs.

## 2022-02-06 IMAGING — CT CT HEAD W/O CM
3 series · 16 of 47 positions shown, 19 images · non-contrast
Comparison: CT head 06/18/2021

CLINICAL DATA: Bradycardia.  Loss of consciousness.

EXAM:
CT HEAD WITHOUT CONTRAST
TECHNIQUE: Contiguous axial images were obtained from the base of the skull
through the vertex without intravenous contrast.

[Series 2: head wo · axial · 0.46mm/px · z∈[-164,-14]mm · 10 of 36 slices shown, 13 images]
[im 3/36  brain]
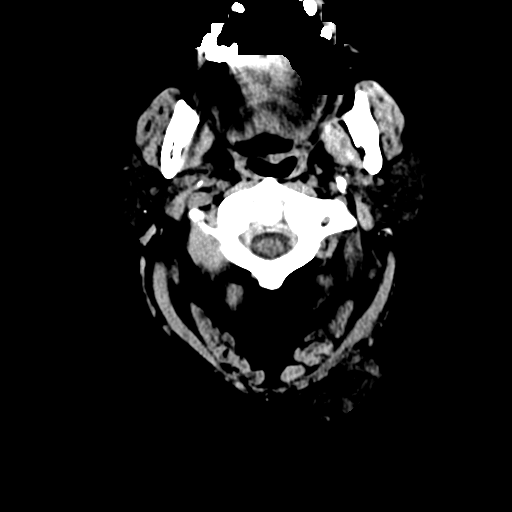
[im 3/36  bone]
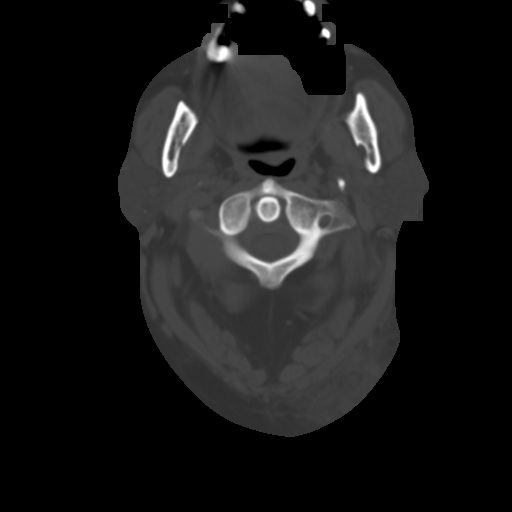
[im 7/36  brain]
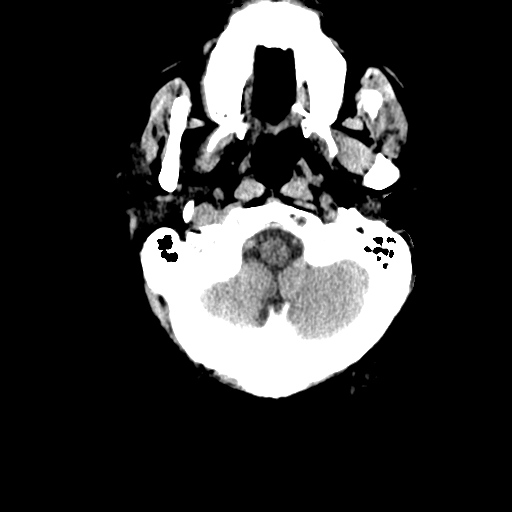
[im 10/36  brain]
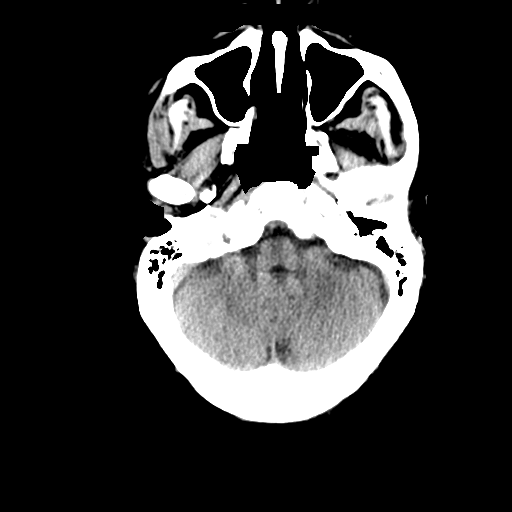
[im 13/36  brain]
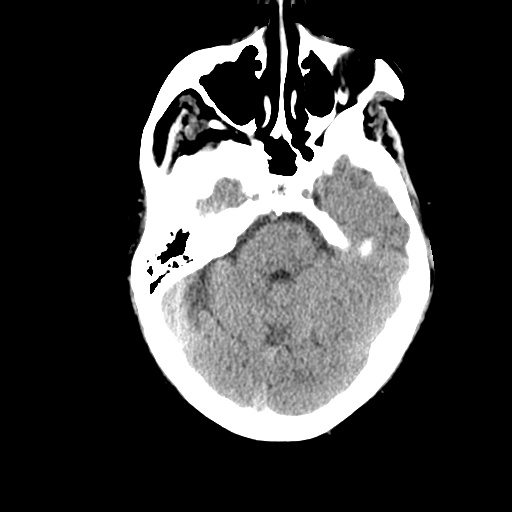
[im 16/36  brain]
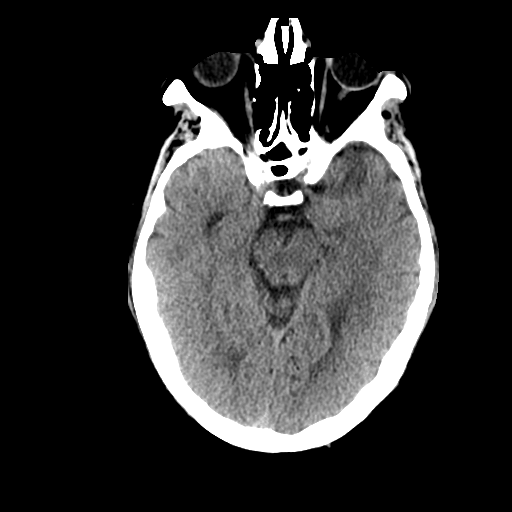
[im 16/36  bone]
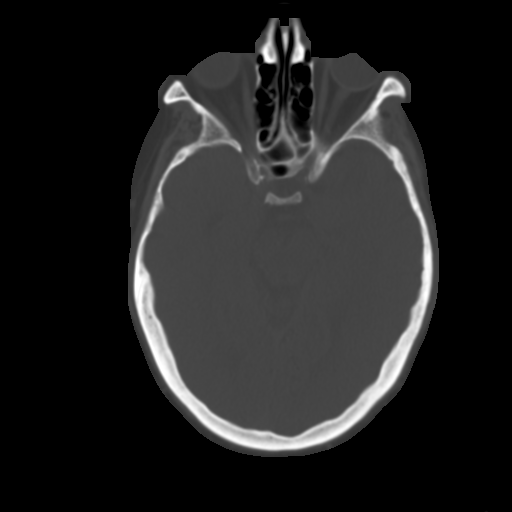
[im 20/36  brain]
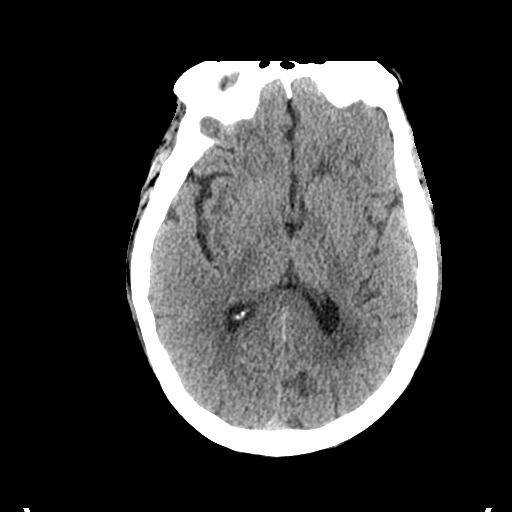
[im 23/36  brain]
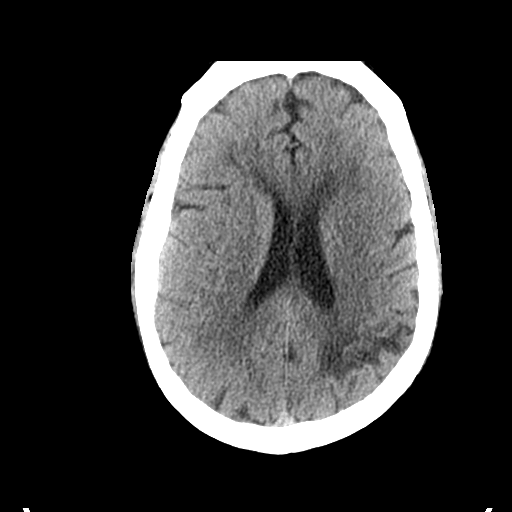
[im 27/36  brain]
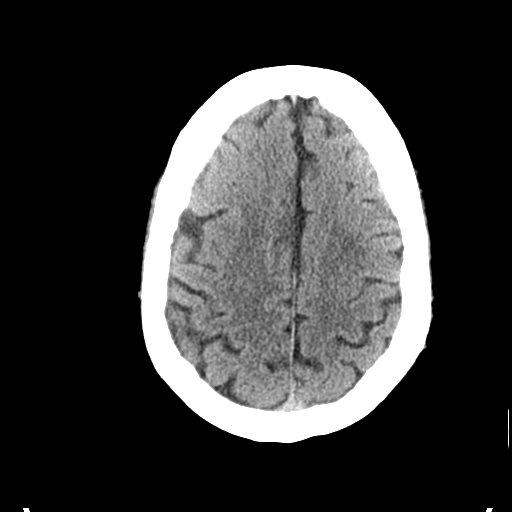
[im 29/36  brain]
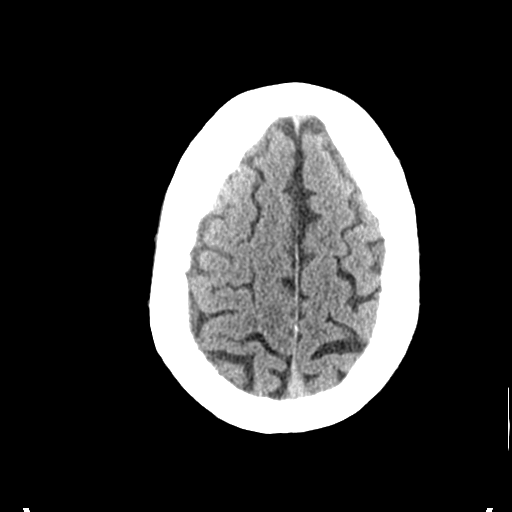
[im 29/36  bone]
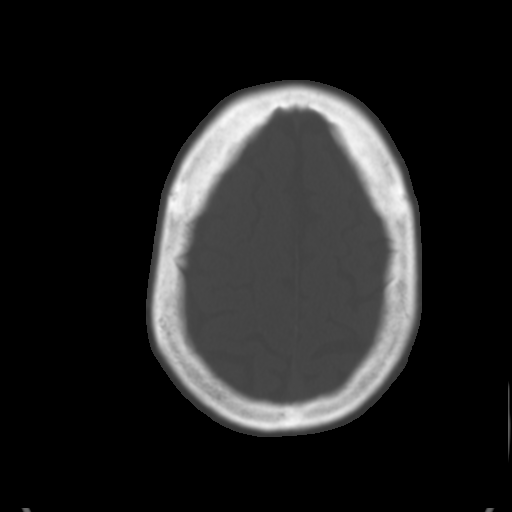
[im 33/36  brain]
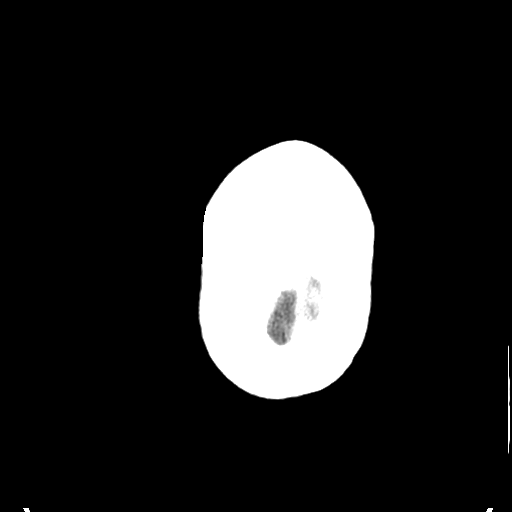

[Series 4: coronal soft tissue · coronal · 0.33mm/px · 3 of 84 slices shown]
[im 28/84  brain]
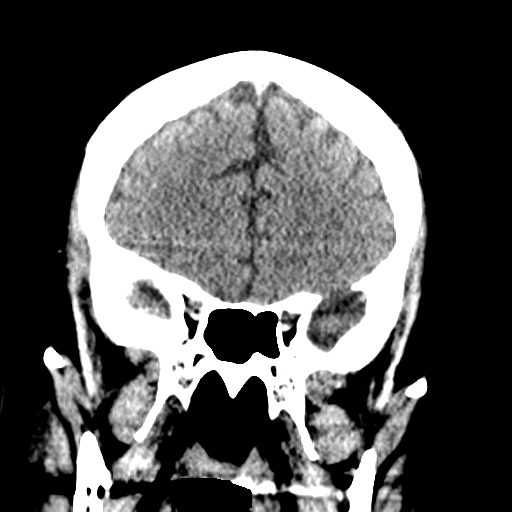
[im 37/84  brain]
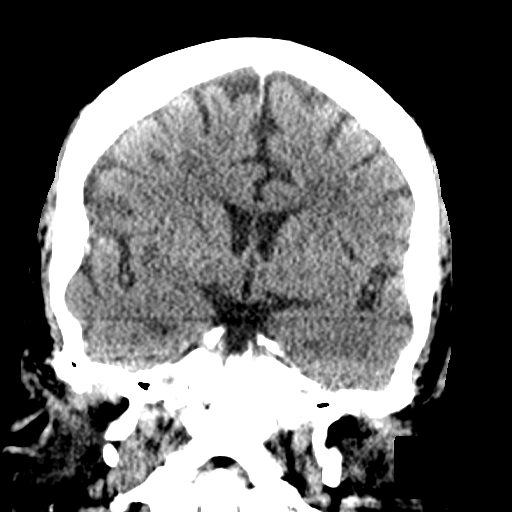
[im 47/84  brain]
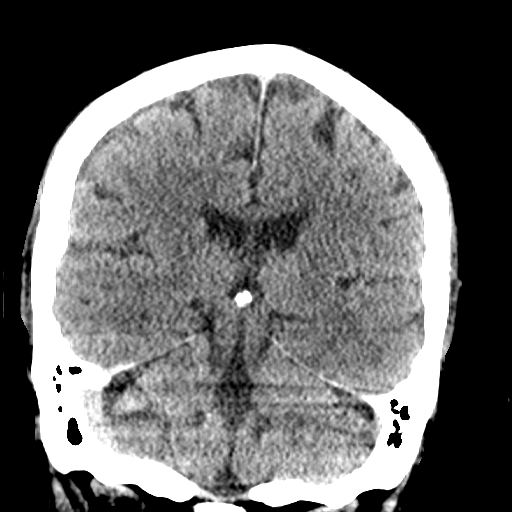

[Series 5: sagittal soft tissue · sagittal · 0.37mm/px · 3 of 80 slices shown]
[im 27/80  brain]
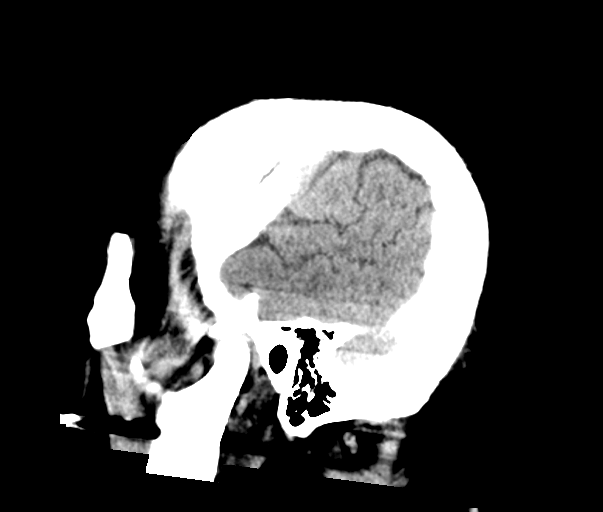
[im 40/80  brain]
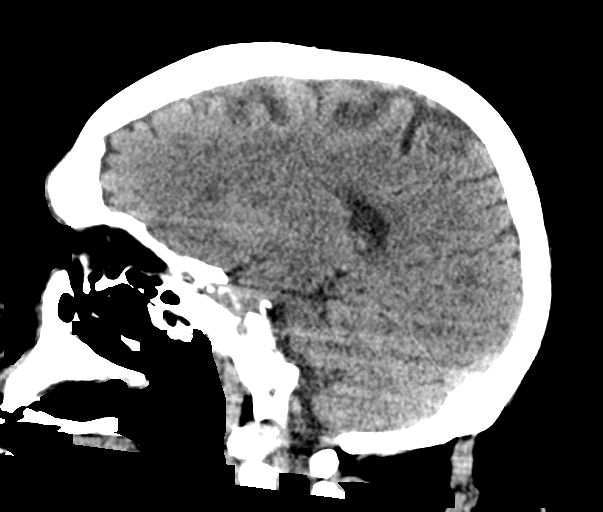
[im 53/80  brain]
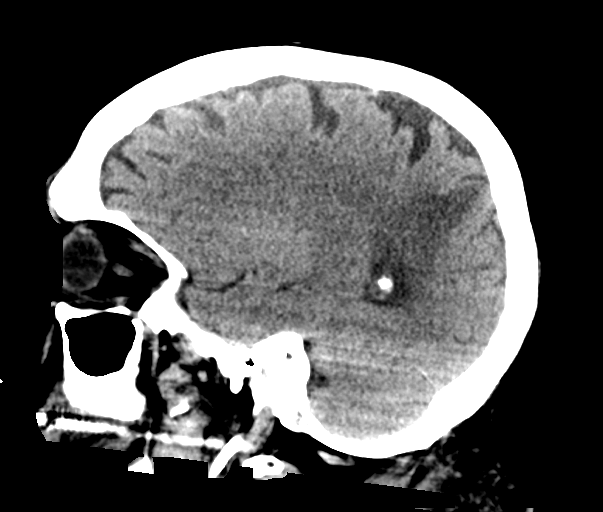

[16 of 47 positions shown; findings below may reference images not displayed]

FINDINGS: Brain:

Similar-appearing chronic left parietal lobe infarction. No evidence
of large-territorial acute infarction. No parenchymal hemorrhage. No
mass lesion. No extra-axial collection.

No mass effect or midline shift. No hydrocephalus. Basilar cisterns
are patent.

Vascular: No hyperdense vessel. Atherosclerotic calcifications are
present within the cavernous internal carotid arteries.

Skull: No acute fracture or focal lesion.

Sinuses/Orbits: Paranasal sinuses and mastoid air cells are clear.
Bilateral lens replacement. Otherwise the orbits are unremarkable.

Other: None.
IMPRESSION: No acute intracranial abnormality.

## 2022-02-08 DIAGNOSIS — Z9861 Coronary angioplasty status: Secondary | ICD-10-CM

## 2022-02-08 DIAGNOSIS — Z955 Presence of coronary angioplasty implant and graft: Secondary | ICD-10-CM | POA: Diagnosis not present

## 2022-02-08 NOTE — Progress Notes (Signed)
Daily Session Note ? ?Patient Details  ?Name: Damon English ?MRN: [id number] ?Date of Birth: 26-Jul-1951 ?Referring Provider:   ?Flowsheet Row Cardiac Rehab from 08/19/2021 in [facility] Cardiac and Pulmonary Rehab  ?Referring Provider Cloretta Ned MD  ? ?  ? ? ?Encounter Date: 02/08/2022 ? ?Check In: ? Session Check In - 02/08/22 1114   ? ?  ? Check-In  ? Supervising physician immediately available to respond to emergencies See telemetry face sheet for immediately available ER MD   ? Location [facility]-Cardiac & Pulmonary Rehab   ? Staff Present Justin Mend, RCP,RRT,BSRT;Melissa Rosebud, RDN, Luther Redo, MPA, RN;Jessica Hawkins, MA, RCEP, CCRP, CCET   ? Virtual Visit No   ? Medication changes reported     No   ? Fall or balance concerns reported    No   ? Tobacco Cessation No Change   ? Warm-up and Cool-down Performed on first and last piece of equipment   ? Resistance Training Performed Yes   ? VAD Patient? No   ? PAD/SET Patient? No   ?  ? Pain Assessment  ? Currently in Pain? No/denies   ? ?  ?  ? ?  ? ? ? ? ? ?Social History  ? ?Tobacco Use  ?Smoking Status Former  ? Packs/day: 2.00  ? Years: 40.00  ? Pack years: 80.00  ? Types: Cigarettes  ? Quit date: 11/27/2010  ? Years since quitting: 11.2  ?Smokeless Tobacco Never  ?Tobacco Comments  ? Quit in 2012  ? ? ?Goals Met:  ?Independence with exercise equipment ?Exercise tolerated well ?No report of concerns or symptoms today ?Strength training completed today ? ?Goals Unmet:  ?Not Applicable ? ?Comments: Pt able to follow exercise prescription today without complaint.  Will continue to monitor for progression. ? ? ? ?Dr. Emily Filbert is Medical Director for [place].  ?Dr. Ottie Glazier is Medical Director for [facility] Pulmonary Rehabilitation. ?

## 2022-02-10 DIAGNOSIS — Z9861 Coronary angioplasty status: Secondary | ICD-10-CM

## 2022-02-10 DIAGNOSIS — Z955 Presence of coronary angioplasty implant and graft: Secondary | ICD-10-CM | POA: Diagnosis not present

## 2022-02-10 NOTE — Progress Notes (Signed)
Cardiac Individual Treatment Plan ? ?Patient Details  ?Name: Damon English ?MRN: [id number] ?Date of Birth: 1951/08/05 ?Referring Provider:   ?Flowsheet Row Cardiac Rehab from 08/19/2021 in [facility] Cardiac and Pulmonary Rehab  ?Referring Provider Cloretta Ned MD  ? ?  ? ? ?Initial Encounter Date:  ?Flowsheet Row Cardiac Rehab from 08/19/2021 in [facility] Cardiac and Pulmonary Rehab  ?Date 08/19/21  ? ?  ? ? ?Visit Diagnosis: S/P PTCA (percutaneous transluminal coronary angioplasty) ? ?Patient's Home Medications on Admission: ? ?Current Outpatient Medications:  ?  acetaminophen (TYLENOL) 325 MG tablet, Take 2 tablets (650 mg total) by mouth every 6 (six) hours as needed for fever., Disp: 20 tablet, Rfl: 0 ?  acidophilus (RISAQUAD) CAPS capsule, Take 2 capsules by mouth 3 (three) times daily., Disp: 30 capsule, Rfl: 0 ?  apixaban (ELIQUIS) 5 MG TABS tablet, Take by mouth., Disp: , Rfl:  ?  atorvastatin (LIPITOR) 80 MG tablet, Take 80 mg by mouth daily., Disp: , Rfl:  ?  brimonidine (ALPHAGAN) 0.2 % ophthalmic solution, Place 1 drop into both eyes 2 (two) times daily., Disp: 5 mL, Rfl: 12 ?  Calcipotriene-Betameth Diprop (ENSTILAR) 0.005-0.064 % FOAM, , Disp: , Rfl:  ?  Chlorhexidine Gluconate Cloth 2 % PADS, Apply 6 each topically daily., Disp: 1 each, Rfl: 0 ?  clonazePAM (KLONOPIN) 0.25 MG disintegrating tablet, Take 1-2 tablets (0.25-0.5 mg total) by mouth 3 (three) times daily as needed (anxiety/agitation)., Disp: 20 tablet, Rfl: 0 ?  clopidogrel (PLAVIX) 75 MG tablet, Take 1 tablet (75 mg total) by mouth daily., Disp: 30 tablet, Rfl: 0 ?  diclofenac Sodium (VOLTAREN) 1 % GEL, Apply 4 g topically 4 (four) times daily., Disp: , Rfl:  ?  docusate sodium (COLACE) 100 MG capsule, Take 1 capsule (100 mg total) by mouth 2 (two) times daily., Disp: 10 capsule, Rfl: 0 ?  DULoxetine (CYMBALTA) 60 MG capsule, Take 1 capsule (60 mg total) by mouth 2 (two) times daily., Disp: 20 capsule, Rfl: 0 ?  esomeprazole (NEXIUM) 40 MG  capsule, Take 1 capsule by mouth daily., Disp: , Rfl:  ?  famotidine (PEPCID) 40 MG tablet, Take 1 tablet by mouth every morning., Disp: , Rfl:  ?  ferrous sulfate 325 (65 FE) MG EC tablet, Take 1 tablet by mouth daily with breakfast., Disp: , Rfl:  ?  folic acid (FOLVITE) 1 MG tablet, Take 1 tablet (1 mg total) by mouth daily., Disp: 30 tablet, Rfl: 0 ?  furosemide (LASIX) 20 MG tablet, Take 20 mg by mouth daily as needed (Edema (Or weight gain of >2lbs overnight or 5lbs in one week))., Disp: , Rfl:  ?  glipiZIDE (GLUCOTROL) 10 MG tablet, Take 5 mg by mouth daily before breakfast., Disp: , Rfl:  ?  guaiFENesin (MUCINEX) 600 MG 12 hr tablet, Take 1 tablet (600 mg total) by mouth 2 (two) times daily., Disp: 30 tablet, Rfl: 0 ?  isosorbide mononitrate (IMDUR) 30 MG 24 hr tablet, Take 1 tablet (30 mg total) by mouth daily., Disp: 30 tablet, Rfl: 0 ?  ketoconazole (NIZORAL) 2 % shampoo, Apply topically., Disp: , Rfl:  ?  latanoprost (XALATAN) 0.005 % ophthalmic solution, Place 1 drop into both eyes at bedtime., Disp: 2.5 mL, Rfl: 12 ?  losartan (COZAAR) 25 MG tablet, Take 25 mg by mouth daily., Disp: , Rfl:  ?  Magnesium 250 MG TABS, Take by mouth., Disp: , Rfl:  ?  metoprolol succinate (TOPROL-XL) 25 MG 24 hr tablet, Take 1 tablet by  mouth at bedtime., Disp: , Rfl:  ?  Multiple Vitamin (MULTIVITAMIN WITH MINERALS) TABS tablet, Take 1 tablet by mouth daily., Disp: 30 tablet, Rfl: 0 ?  nitroGLYCERIN (NITROSTAT) 0.4 MG SL tablet, Place 1 tablet (0.4 mg total) under the tongue every 5 (five) minutes as needed for chest pain., Disp: 20 tablet, Rfl: 12 ?  omeprazole (PRILOSEC) 40 MG capsule, Take 40 mg by mouth daily., Disp: , Rfl:  ?  ondansetron (ZOFRAN) 4 MG/2ML SOLN injection, Inject 2 mLs (4 mg total) into the vein every 6 (six) hours as needed for nausea., Disp: 2 mL, Rfl: 0 ?  phenol (CHLORASEPTIC) 1.4 % LIQD, Use as directed 1 spray in the mouth or throat as needed for throat irritation / pain., Disp: 10 mL, Rfl: 0 ?   polyethylene glycol (MIRALAX / GLYCOLAX) 17 g packet, Take 17 g by mouth daily as needed for mild constipation or moderate constipation., Disp: 14 each, Rfl: 0 ?  potassium chloride SA (KLOR-CON) 20 MEQ tablet, Take by mouth. 6 tablets, Disp: , Rfl:  ?  ranolazine (RANEXA) 500 MG 12 hr tablet, Take 500 mg by mouth 2 (two) times daily., Disp: , Rfl:  ?  SENNOSIDES PO, Take by mouth., Disp: , Rfl:  ?  spironolactone (ALDACTONE) 25 MG tablet, Take 12.5 mg by mouth., Disp: , Rfl:  ?  sucralfate (CARAFATE) 1 g tablet, Take 1 tablet by mouth 4 (four) times daily -  before meals and at bedtime., Disp: , Rfl:  ?  thiamine 100 MG tablet, Take 1 tablet (100 mg total) by mouth daily., Disp: 30 tablet, Rfl: 0 ? ?Past Medical History: ?Past Medical History:  ?Diagnosis Date  ? Alcohol use   ? "cutting back" but still heavy and daily, multiple shots of bourbon each night  ? Diabetes mellitus without complication ([place])   ? History of tobacco use   ? reportedly quit in 2013  ? Hypertension   ? Pulmonary embolism ([place]) 2012  ? unprovoked  ? PVD (peripheral vascular disease) ([place])   ? ? ?Tobacco Use: ?Social History  ? ?Tobacco Use  ?Smoking Status Former  ? Packs/day: 2.00  ? Years: 40.00  ? Pack years: 80.00  ? Types: Cigarettes  ? Quit date: 11/27/2010  ? Years since quitting: 11.2  ?Smokeless Tobacco Never  ?Tobacco Comments  ? Quit in 2012  ? ? ?Labs: ?Review Flowsheet   ? ?  ?  Latest Ref Rng & Units 01/28/2019 01/29/2019 06/18/2021  ?Labs for [facility] Cardiac and Pulmonary Rehab  ?Cholestrol 0 - 200 mg/dL 168   143     ?LDL (calc) 0 - 99 mg/dL 113   93     ?HDL-C >40 mg/dL 36   33     ?Trlycerides <150 mg/dL 97   87     ?Hemoglobin A1c 4.8 - 5.6 %   6.1    ?PH, Arterial 7.350 - 7.450   7.48    ? 7.58    ?PCO2 arterial 32.0 - 48.0 mmHg   47    ? 28    ?Bicarbonate 20.0 - 28.0 mmol/L   35.0    ? 26.3    ?O2 Saturation %   99.0    ? 99.9    ?  ? ? Multiple values from one day are sorted in reverse-chronological order  ?  ?   ? ? ? ?Exercise Target Goals: ?Exercise Program Goal: ?Individual exercise prescription set using results from initial 6 min walk test  and THRR while considering  patient?s activity barriers and safety.  ? ?Exercise Prescription Goal: ?Initial exercise prescription builds to 30-45 minutes a day of aerobic activity, 2-3 days per week.  Home exercise guidelines will be given to patient during program as part of exercise prescription that the participant will acknowledge. ? ? ?Education: Aerobic Exercise: ?- Group verbal and visual presentation on the components of exercise prescription. Introduces F.I.T.T principle from ACSM for exercise prescriptions.  Reviews F.I.T.T. principles of aerobic exercise including progression. Written material given at graduation. ?Flowsheet Row Cardiac Rehab from 09/19/2019 in [facility] Cardiac and Pulmonary Rehab  ?Date 08/22/19  ?Educator jh  ?Instruction Review Code 1- Verbalizes Understanding  ? ?  ? ? ?Education: Resistance Exercise: ?- Group verbal and visual presentation on the components of exercise prescription. Introduces F.I.T.T principle from ACSM for exercise prescriptions  Reviews F.I.T.T. principles of resistance exercise including progression. Written material given at graduation. ? ?  ?Education: Exercise & Equipment Safety: ?- Individual verbal instruction and demonstration of equipment use and safety with use of the equipment. ?Flowsheet Row Cardiac Rehab from 02/10/2022 in [facility] Cardiac and Pulmonary Rehab  ?Date 08/19/21  ?Educator KL  ?Instruction Review Code 1- Verbalizes Understanding  ? ?  ? ? ?Education: Exercise Physiology & General Exercise Guidelines: ?- Group verbal and written instruction with models to review the exercise physiology of the cardiovascular system and associated critical values. Provides general exercise guidelines with specific guidelines to those with heart or lung disease.  ?Flowsheet Row Cardiac Rehab from 02/10/2022 in [facility] Cardiac and Pulmonary  Rehab  ?Date 02/10/22  ?Educator [facility]  ?Instruction Review Code 1- Verbalizes Understanding  ? ?  ? ? ?Education: Flexibility, Balance, Mind/Body Relaxation: ?- Group verbal and visual presentation with interactive [place]

## 2022-02-10 NOTE — Progress Notes (Signed)
Discharge Progress Report ? ?Patient Details  ?Name: Damon English ?MRN: [id number] ?Date of Birth: 24-Dec-1950 ?Referring Provider:   ?Flowsheet Row Cardiac Rehab from 08/19/2021 in [facility] Cardiac and Pulmonary Rehab  ?Referring Provider Cloretta Ned MD  ? ?  ? ? ? ?Number of Visits: 64 ? ?Reason for Discharge:  ?Patient reached a stable level of exercise. ?Patient independent in their exercise. ?Patient has met program and personal goals. ? ?Smoking History:  ?Social History  ? ?Tobacco Use  ?Smoking Status Former  ? Packs/day: 2.00  ? Years: 40.00  ? Pack years: 80.00  ? Types: Cigarettes  ? Quit date: 11/27/2010  ? Years since quitting: 11.2  ?Smokeless Tobacco Never  ?Tobacco Comments  ? Quit in 2012  ? ? ?Diagnosis:  ?S/P PTCA (percutaneous transluminal coronary angioplasty) ? ?ADL UCSD: ? ? ?Initial Exercise Prescription: ? Initial Exercise Prescription - 08/19/21 1400   ? ?  ? Date of Initial Exercise RX and Referring Provider  ? Date 08/19/21   ? Referring Provider Cloretta Ned MD   ?  ? Oxygen  ? Maintain Oxygen Saturation 88% or higher   ?  ? Recumbant Bike  ? Level 1   ? RPM 60   ? Minutes 15   ? METs 1.7   ?  ? NuStep  ? Level 1   ? SPM 80   ? Minutes 15   ? METs 1.7   ?  ? Biostep-RELP  ? Level 1   ? SPM 50   ? Minutes 15   ? METs 1.7   ?  ? Track  ? Laps 5   as tolerated  ? Minutes 15   ? METs 1.2   ?  ? Prescription Details  ? Frequency (times per week) 2   ? Duration Progress to 30 minutes of continuous aerobic without signs/symptoms of physical distress   ?  ? Intensity  ? THRR 40-80% of Max Heartrate 104-136   ? Ratings of Perceived Exertion 11-15   ? Perceived Dyspnea 0-4   ?  ? Resistance Training  ? Training Prescription Yes   ? Weight 3 lb   ROM on left  ? Reps 10-15   ? ?  ?  ? ?  ? ? ?Discharge Exercise Prescription (Final Exercise Prescription Changes): ? Exercise Prescription Changes - 02/01/22 0900   ? ?  ? Response to Exercise  ? Blood Pressure (Admit) 132/64   ? Blood Pressure (Exit)  110/60   ? Heart Rate (Admit) 79 bpm   ? Heart Rate (Exercise) 123 bpm   ? Heart Rate (Exit) 68 bpm   ? Rating of Perceived Exertion (Exercise) 14   ? Symptoms none   ? Duration Continue with 30 min of aerobic exercise without signs/symptoms of physical distress.   ? Intensity THRR unchanged   ?  ? Progression  ? Progression Continue to progress workloads to maintain intensity without signs/symptoms of physical distress.   ? Average METs 1.6   ?  ? Resistance Training  ? Training Prescription Yes   ? Weight 4 lb   ? Reps 10-15   ?  ? Interval Training  ? Interval Training No   ?  ? NuStep  ? Level 6   ? Minutes 30   ? METs 2.1   ?  ? REL-XR  ? Level 1   ? Minutes 30   ? METs 1.1   ?  ? Home Exercise Plan  ? Plans  to continue exercise at Home (comment)   walking, videos  ? Frequency Add 2 additional days to program exercise sessions.   ? Initial Home Exercises Provided 10/07/21   ?  ? Oxygen  ? Maintain Oxygen Saturation 88% or higher   ? ?  ?  ? ?  ? ? ?Functional Capacity: ? 6 Minute Walk   ? ? [place] Name 08/19/21 1000 02/01/22 1134  ?  ?  ? 6 Minute Walk  ? Phase Initial Discharge   ? Distance 590 feet 645 feet   ? Distance % Change -- 9.3 %   ? Distance Feet Change -- 55 ft   ? Walk Time 5 minutes 6 minutes   ? # of Rest Breaks 0 0   ? MPH 1.34 1.22   ? METS 1.78 1.81   ? RPE 15 15   ? Perceived Dyspnea  1 --   ? VO2 Peak 6.23 6.32   ? Symptoms Yes (comment) Yes (comment)   ? Comments chest tightness 2/10, fatigue fatigue, legs sore   ? Resting HR 73 bpm 61 bpm   ? Resting BP 136/70 122/74   ? Resting Oxygen Saturation  99 % --   ? Exercise Oxygen Saturation  during 6 min walk 100 % --   ? Max Ex. HR 107 bpm 95 bpm   ? Max Ex. BP 148/76 146/78   ? 2 Minute Post BP 128/76 --   ? ?  ?  ? ?  ? ? ?Psychological, QOL, Others - Outcomes: ?PHQ 2/9: ? ?  02/08/2022  ? 11:15 AM 12/01/2021  ?  1:56 PM 08/19/2021  ?  2:22 PM 10/01/2019  ? 12:45 PM 07/02/2019  ? 11:10 AM  ?Depression screen PHQ 2/9  ?Decreased Interest 2 0 0 0 0   ?Down, Depressed, Hopeless 1 1 0 0 0  ?PHQ - 2 Score 3 1 0 0 0  ?Altered sleeping 1 0 1  1  ?Tired, decreased energy _0 ?Change in appetite 1 0 0 0 0  ?Feeling bad or failure about yourself  1 0 0 1 0  ?Trouble concentrating 3 0 0 0 0  ?Moving slowly or fidgety/restless 0 _1 ?Suicidal thoughts 0 0 0 0 0  ?PHQ-9 Score _2 ?Difficult doing work/chores Somewhat difficult Not difficult at all Somewhat difficult Somewhat difficult Not difficult at all  ? ? ?Quality of Life: ? Quality of Life - 02/08/22 1128   ? ?  ? Quality of Life Scores  ? Health/Function Pre 17.13 %   ? Health/Function Post 13.2 %   ? Health/Function % Change -22.94 %   ? Socioeconomic Pre 28.29 %   ? Socioeconomic Post 24.75 %   ? Socioeconomic % Change  -12.51 %   ? Psych/Spiritual Pre 22.29 %   ? Psych/Spiritual Post 23 %   ? Psych/Spiritual % Change 3.19 %   ? Family Pre 27.6 %   ? Family Post 30 %   ? Family % Change 8.7 %   ? GLOBAL Pre 22.03 %   ? GLOBAL Post 20.2 %   ? GLOBAL % Change -8.31 %   ? ?  ?  ? ?  ? ? ? ?Nutrition & Weight - Outcomes: ? Pre Biometrics - 08/19/21 1436   ? ?  ? Pre Biometrics  ? Height 5' 6.5" (1.689 m)   ? Weight 193 lb 4.8  oz (87.7 kg)   ? BMI (Calculated) 30.74   ? ?  ?  ? ?  ? ? Post Biometrics - 02/01/22 1135   ? ?  ?  Post  Biometrics  ? Height 5' 6.5" (1.689 m)   ? Weight 182 lb 9.6 oz (82.8 kg)   ? BMI (Calculated) 29.03   ? Single Leg Stand 2.6 seconds   ? ?  ?  ? ?  ? ? ?Nutrition: ? Nutrition Therapy & Goals - 09/08/21 1331   ? ?  ? Nutrition Therapy  ? Diet Heart healthy, low Na, diabetes friendly   ? Drug/Food Interactions Statins/Certain Fruits   ? Protein (specify units) 70g   ? Fiber 25 grams   ? Whole Grain Foods 3 servings   ? Saturated Fats 12 max. grams   ? Fruits and Vegetables 5 servings/day   ? Sodium 1.5 grams   ?  ? Personal Nutrition Goals  ? Nutrition Goal ST: pair carbohydrates with fiber, fat, and protein. Eat 2-4 CHO per meal. LT: maintain heart healthy changes, limit  BG lows   ? Comments T2DM controlled by diet. BG has been 85-90 1st thing in the morning and in the day 125-130. He reports his wife said he is not eating as much as he used to. He is concerned about his weight - he feels like his weight is going down unintentionally. Wife makes cookies with flaxseeds (1 per day throughout the day). B: cereal (multigrain cheerios with almond milk with blueberries, strawberries, or banana) or eggs with 1 slice of whole wheat toast at home or at the grill. He does not eat bacon at all anymore and he will eat sausage rarely. OJ with lemonade. L: leftovers or sandiwch with whole wheat bread ([place] with lite mayo and lettuce and tomato) or going out to eat (grilled food like burgers at mom and pop place and some vegetables) D: grilled chicken, grilled fish with a salad or another vegetable (he eats a variety), when going out to eat will get baked potato and eat half. He loves sweet potatoes and zucchini. Tomato based foods can give him reflux. Drinks: pepsi zero (2: 16 oz bottle). He used to drink liquor, but has stopped. He goes out to eat 3-4x/week and will have leftovers from that. Reviewed heart healthy eating, how eating at home can help to limit Na, increase whole grains, increase variety, and allows use of healthy oils/fats such as olive oil which he mentioned is all they use at home. Discussed diabetes friendly eating.   ?  ? Intervention Plan  ? Intervention Prescribe, educate and counsel regarding individualized specific dietary modifications aiming towards targeted core components such as weight, hypertension, lipid management, diabetes, heart failure and other comorbidities.   ? Expected Outcomes Short Term Goal: Understand basic principles of dietary content, such as calories, fat, sodium, cholesterol and nutrients.;Short Term Goal: A plan has been developed with personal nutrition goals set during dietitian appointment.;Long Term Goal: Adherence to prescribed nutrition  plan.   ? ?  ?  ? ?  ? ? ?Nutrition Discharge: ? ? ?Education Questionnaire Score: ? Knowledge Questionnaire Score - 02/08/22 1116   ? ?  ? Knowledge Questionnaire Score  ? Pre Score 25/26: Exercise   ?

## 2022-02-10 NOTE — Progress Notes (Signed)
Daily Session Note ? ?Patient Details  ?Name: Damon English ?MRN: [id number] ?Date of Birth: 23-Mar-1951 ?Referring Provider:   ?Flowsheet Row Cardiac Rehab from 08/19/2021 in [facility] Cardiac and Pulmonary Rehab  ?Referring Provider Cloretta Ned MD  ? ?  ? ? ?Encounter Date: 02/10/2022 ? ?Check In: ? Session Check In - 02/10/22 1045   ? ?  ? Check-In  ? Supervising physician immediately available to respond to emergencies See telemetry face sheet for immediately available ER MD   ? Location [facility]-Cardiac & Pulmonary Rehab   ? Staff Present Birdie Sons, MPA, RN;Joseph Anthoston, RCP,RRT,BSRT;Melissa Payette, RDN, LDN   ? Virtual Visit No   ? Medication changes reported     No   ? Fall or balance concerns reported    No   ? Tobacco Cessation No Change   ? Warm-up and Cool-down Performed on first and last piece of equipment   ? Resistance Training Performed Yes   ? VAD Patient? No   ? PAD/SET Patient? No   ?  ? Pain Assessment  ? Currently in Pain? No/denies   ? ?  ?  ? ?  ? ? ? ? ? ?Social History  ? ?Tobacco Use  ?Smoking Status Former  ? Packs/day: 2.00  ? Years: 40.00  ? Pack years: 80.00  ? Types: Cigarettes  ? Quit date: 11/27/2010  ? Years since quitting: 11.2  ?Smokeless Tobacco Never  ?Tobacco Comments  ? Quit in 2012  ? ? ?Goals Met:  ?Independence with exercise equipment ?Exercise tolerated well ?No report of concerns or symptoms today ?Strength training completed today ? ?Goals Unmet:  ?Not Applicable ? ?Comments:  Damon English graduated today from  rehab with 36 sessions completed.  Details of the patient's exercise prescription and what He needs to do in order to continue the prescription and progress were discussed with patient.  Patient was given a copy of prescription and goals.  Patient verbalized understanding.  Damon English plans to continue to exercise by walking at home and using staff videos. ? ? ? ?Dr. Emily Filbert is Medical Director for [place].  ?Dr. Ottie Glazier is Medical Director for  [facility] Pulmonary Rehabilitation. ?

## 2022-02-15 ENCOUNTER — Other Ambulatory Visit (INDEPENDENT_AMBULATORY_CARE_PROVIDER_SITE_OTHER): Payer: Self-pay | Admitting: Nurse Practitioner

## 2022-02-15 ENCOUNTER — Ambulatory Visit (INDEPENDENT_AMBULATORY_CARE_PROVIDER_SITE_OTHER): Payer: BC Managed Care – PPO | Admitting: Nurse Practitioner

## 2022-02-15 ENCOUNTER — Ambulatory Visit (INDEPENDENT_AMBULATORY_CARE_PROVIDER_SITE_OTHER): Payer: BC Managed Care – PPO

## 2022-02-15 ENCOUNTER — Encounter (INDEPENDENT_AMBULATORY_CARE_PROVIDER_SITE_OTHER): Payer: Self-pay | Admitting: Nurse Practitioner

## 2022-02-15 ENCOUNTER — Telehealth (INDEPENDENT_AMBULATORY_CARE_PROVIDER_SITE_OTHER): Payer: Self-pay | Admitting: Nurse Practitioner

## 2022-02-15 VITALS — BP 131/78 | HR 67 | Resp 16 | Ht 67.5 in | Wt 183.0 lb

## 2022-02-15 DIAGNOSIS — L819 Disorder of pigmentation, unspecified: Secondary | ICD-10-CM

## 2022-02-15 DIAGNOSIS — I739 Peripheral vascular disease, unspecified: Secondary | ICD-10-CM

## 2022-02-15 DIAGNOSIS — E785 Hyperlipidemia, unspecified: Secondary | ICD-10-CM

## 2022-02-15 DIAGNOSIS — Z9889 Other specified postprocedural states: Secondary | ICD-10-CM | POA: Diagnosis not present

## 2022-02-15 DIAGNOSIS — I10 Essential (primary) hypertension: Secondary | ICD-10-CM | POA: Diagnosis not present

## 2022-02-15 MED ORDER — CEPHALEXIN 500 MG PO CAPS: 500.0000 mg | ORAL_CAPSULE | Freq: Four times a day (QID) | ORAL | 0 refills | Status: DC

## 2022-02-15 NOTE — Telephone Encounter (Signed)
LVM for pt to see if he can come in at 2:00 today instead of 2:30. If he calls back, please inquire and move to 2:00 if he is ok.  ?

## 2022-02-27 ENCOUNTER — Encounter (INDEPENDENT_AMBULATORY_CARE_PROVIDER_SITE_OTHER): Payer: Self-pay | Admitting: Nurse Practitioner

## 2022-02-27 NOTE — Progress Notes (Signed)
? ?Subjective:  ? ? Patient ID: Damon English, male    DOB: 07-Mar-1951, 71 y.o.   MRN: [id number] ?No chief complaint on file. ? ? ?The patient returns to the office for followup and review of the noninvasive studies.  ? ?There have been no interval changes in lower extremity symptoms. No interval shortening of the patient's claudication distance or development of rest pain symptoms. No new ulcers or wounds have occurred since the last visit. ? ?The patient has recently had an additional heart stent placed.  Also noted in his upper extremities but studies today show PPG waveforms in the bilateral upper extremities. ?The patient denies amaurosis fugax or recent TIA symptoms. There are no documented recent neurological changes noted. ?There is no history of DVT, PE or superficial thrombophlebitis. ?The patient denies recent episodes of angina or shortness of breath.  ? ?ABI Rt=1.0 and Lt=1.01  (previous ABI's Rt=1.17 and Lt=1.16) ?Duplex ultrasound of the bilateral tibial arteries reveals biphasic waveforms with normal toe waveforms bilaterally there is a notable elevated velocity at the left profunda ? ? ?Review of Systems  ?All other systems reviewed and are negative. ? ?   ?Objective:  ? Physical Exam ?Vitals reviewed.  ?HENT:  ?   Head: Normocephalic.  ?Cardiovascular:  ?   Rate and Rhythm: Normal rate.  ?Pulmonary:  ?   Effort: Pulmonary effort is normal.  ?Musculoskeletal:  ?   Right lower leg: Edema present.  ?Skin: ?   General: Skin is warm and dry.  ?Neurological:  ?   Mental Status: He is alert and oriented to person, place, and time.  ?Psychiatric:     ?   Mood and Affect: Mood normal.     ?   Behavior: Behavior normal.     ?   Thought Content: Thought content normal.     ?   Judgment: Judgment normal.  ? ? ?BP 131/78 (BP Location: Left Arm)   Pulse 67   Resp 16   Ht 5' 7.5" (1.715 m)   Wt 183 lb (83 kg)   BMI 28.24 kg/m?  ? ?Past Medical History:  ?Diagnosis Date  ? Alcohol use   ? "cutting back" but  still heavy and daily, multiple shots of bourbon each night  ? Diabetes mellitus without complication ([place])   ? History of tobacco use   ? reportedly quit in 2013  ? Hypertension   ? Pulmonary embolism ([place]) 2012  ? unprovoked  ? PVD (peripheral vascular disease) ([place])   ? ? ?Social History  ? ?Socioeconomic History  ? Marital status: Married  ?  Spouse name: Not on file  ? Number of children: Not on file  ? Years of education: Not on file  ? Highest education level: Not on file  ?Occupational History  ? Not on file  ?Tobacco Use  ? Smoking status: Former  ?  Packs/day: 2.00  ?  Years: 40.00  ?  Pack years: 80.00  ?  Types: Cigarettes  ?  Quit date: 11/27/2010  ?  Years since quitting: 11.2  ? Smokeless tobacco: Never  ? Tobacco comments:  ?  Quit in 2012  ?Vaping Use  ? Vaping Use: Never used  ?Substance and Sexual Activity  ? Alcohol use: Yes  ?  Alcohol/week: 7.0 standard drinks  ?  Types: 7 Shots of liquor per week  ?  Comment: stopped alcohol 06/17/2021  ? Drug use: Never  ? Sexual activity: Not on file  ?Other Topics  Concern  ? Not on file  ?Social History Narrative  ? Not on file  ? ?Social Determinants of Health  ? ?Financial Resource Strain: Not on file  ?Food Insecurity: Not on file  ?Transportation Needs: Not on file  ?Physical Activity: Not on file  ?Stress: Not on file  ?Social Connections: Not on file  ?Intimate Partner Violence: Not on file  ? ? ?Past Surgical History:  ?Procedure Laterality Date  ? BACK SURGERY    ? CHOLECYSTECTOMY    ? CORONARY ANGIOGRAPHY N/A 01/30/2019  ? Procedure: CORONARY ANGIOGRAPHY;  Surgeon: Corey Skains, MD;  Location: [facility];  Service: Cardiovascular;  Laterality: N/A;  ? ERCP W/ SPHICTEROTOMY  2015  ? FEMORAL ARTERY - POPLITEAL ARTERY BYPASS GRAFT Right 06/19/2018  ? LEFT HEART CATH AND CORS/GRAFTS ANGIOGRAPHY N/A 06/25/2021  ? Procedure: LEFT HEART CATH AND CORS/GRAFTS ANGIOGRAPHY;  Surgeon: Andrez Grime, MD;  Location: [facility];  Service:  Cardiovascular;  Laterality: N/A;  ? LOWER EXTREMITY ANGIOGRAPHY Left 01/14/2021  ? Procedure: LOWER EXTREMITY ANGIOGRAPHY;  Surgeon: Algernon Huxley, MD;  Location: [facility];  Service: Cardiovascular;  Laterality: Left;  ? LOWER EXTREMITY ANGIOGRAPHY Left 11/04/2021  ? Procedure: LOWER EXTREMITY ANGIOGRAPHY;  Surgeon: Algernon Huxley, MD;  Location: [facility];  Service: Cardiovascular;  Laterality: Left;  ? TEMPORARY PACEMAKER N/A 07/11/2021  ? Procedure: TEMPORARY PACEMAKER;  Surgeon: Wellington Hampshire, MD;  Location: [facility];  Service: Cardiovascular;  Laterality: N/A;  ? THROMBOENDARTERECTOMY Right 06/19/2018  ? TONSILLECTOMY    ? VASCULAR SURGERY    ? ? ?Family History  ?Problem Relation Age of Onset  ? Hypertension Mother   ? Parkinson's disease Mother   ? Heart attack Father   ? Vascular Disease Father   ? AAA (abdominal aortic aneurysm) Father   ? Alzheimer's disease Father   ? ? ?Allergies  ?Allergen Reactions  ? Amoxicillin Other (See Comments)  ?  Abdominal cramps ?Other reaction(s): Other (See Comments) ?Abdominal cramps ?Tolerated ancef 7/16  ? Hydromorphone Hcl   ?  Quick & severe nausea/vomiting  ?Other reaction(s): Vomiting ?Quick & severe nausea/vomiting   ? Aspirin Other (See Comments)  ?  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  Is on NOAC for now.  No ASA with it. Not allergic per patient.  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  Is on NOAC for now.  No ASA with it. Not allergic per patient.  ? ?Per pt not an allergy  ? ? ? ?  Latest Ref Rng & Units 07/11/2021  ?  1:17 PM 06/23/2021  ?  2:40 AM 06/22/2021  ?  3:51 AM  ?CBC  ?WBC 4.0 - 10.5 K/uL 9.9   6.0   6.2    ?Hemoglobin 13.0 - 17.0 g/dL 13.6   10.8   11.1    ?Hematocrit 39.0 - 52.0 % 41.1   32.4   34.2    ?Platelets 150 - 400 K/uL 158   163   162    ? ? ? ? ?CMP  ?   ?Component Value Date/Time  ? NA 135 07/11/2021 1317  ? NA 136 11/08/2013 1530  ? K 4.2 07/11/2021 1317  ? K 3.4 (L) 11/08/2013  1530  ? CL 100 07/11/2021 1317  ? CL 102 11/08/2013 1530  ? CO2 21 (L) 07/11/2021 1317  ? CO2 27 11/08/2013 1530  ? GLUCOSE 139 (H) 07/11/2021 1317  ?  GLUCOSE 150 (H) 11/08/2013 1530  ? BUN 30 (H) 11/04/2021 1026  ? BUN 16 11/08/2013 1530  ? CREATININE 1.53 (H) 11/04/2021 1026  ? CREATININE 1.07 11/08/2013 1530  ? CALCIUM 9.5 07/11/2021 1317  ? CALCIUM 9.3 11/08/2013 1530  ? PROT 7.4 07/11/2021 1317  ? ALBUMIN 3.8 07/11/2021 1317  ? AST 28 07/11/2021 1317  ? ALT 26 07/11/2021 1317  ? ALKPHOS 121 07/11/2021 1317  ? BILITOT 0.9 07/11/2021 1317  ? GFRNONAA 49 (L) 11/04/2021 1026  ? GFRNONAA >60 11/08/2013 1530  ? GFRAA >60 01/30/2019 0086  ? GFRAA >60 11/08/2013 1530  ? ? ? ?VAS [place] ABI WITH/WO TBI ? ?Result Date: 02/17/2022 ? LOWER EXTREMITY DOPPLER STUDY Patient Name:  STELLA ENCARNACION  Date of Exam:   02/15/2022 Medical Rec #: [id number]         Accession #:    [id number] Date of Birth: 1951/02/08          Patient Gender: M Patient Age:   27 years Exam Location:  [facility] Procedure:      VAS [place] ABI WITH/WO TBI Referring Phys: Eulogio Ditch --------------------------------------------------------------------------------  Indications: Peripheral artery disease.  Vascular Interventions: 01/14/21: Bilateral CIA & right EIA stents;                         11/04/21: Left SFA-popliteal stent;. Performing Technologist: Blondell Reveal RT, RDMS, RVT  Examination Guidelines: A complete evaluation includes at minimum, Doppler waveform signals and systolic blood pressure reading at the level of bilateral brachial, anterior tibial, and posterior tibial arteries, when vessel segments are accessible. Bilateral testing is considered an integral part of a complete examination. Photoelectric Plethysmograph (PPG) waveforms and toe systolic pressure readings are included as required and additional duplex testing as needed. Limited examinations for reoccurring indications may be performed as noted.  ABI Findings:  +---------+------------------+-----+--------+--------+ Right    Rt Pressure (mmHg)IndexWaveformComment  +---------+------------------+-----+--------+--------+ Brachial 148                                     +---------+---

## 2022-03-03 ENCOUNTER — Encounter: Payer: Self-pay | Admitting: Student in an Organized Health Care Education/Training Program

## 2022-03-03 ENCOUNTER — Ambulatory Visit
Payer: BC Managed Care – PPO | Attending: Student in an Organized Health Care Education/Training Program | Admitting: Student in an Organized Health Care Education/Training Program

## 2022-03-03 VITALS — BP 146/71 | HR 71 | Temp 96.8°F | Resp 16 | Ht 67.0 in | Wt 185.0 lb

## 2022-03-03 DIAGNOSIS — I739 Peripheral vascular disease, unspecified: Secondary | ICD-10-CM | POA: Diagnosis not present

## 2022-03-03 DIAGNOSIS — M7918 Myalgia, other site: Secondary | ICD-10-CM | POA: Diagnosis not present

## 2022-03-03 DIAGNOSIS — M792 Neuralgia and neuritis, unspecified: Secondary | ICD-10-CM | POA: Diagnosis not present

## 2022-03-03 DIAGNOSIS — E1140 Type 2 diabetes mellitus with diabetic neuropathy, unspecified: Secondary | ICD-10-CM | POA: Diagnosis not present

## 2022-03-03 DIAGNOSIS — G894 Chronic pain syndrome: Secondary | ICD-10-CM | POA: Insufficient documentation

## 2022-03-03 DIAGNOSIS — G8929 Other chronic pain: Secondary | ICD-10-CM | POA: Diagnosis not present

## 2022-03-03 MED ORDER — GABAPENTIN 300 MG PO CAPS: ORAL_CAPSULE | ORAL | 0 refills | Status: DC

## 2022-03-03 NOTE — Progress Notes (Signed)
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 observed          Stability: No instability observed          Skin & Extremity Inspection: Some redness observed, positive for color changes, allodynia  Skin & Extremity Inspection: Skin color, temperature, and hair growth are WNL. No peripheral edema or cyanosis. No masses, redness, swelling, asymmetry, or associated skin lesions. No contractures.  Functional ROM: Pain restricted ROM for all joints of the lower extremity          Functional ROM: Unrestricted ROM  Muscle Tone/Strength: Functionally intact. No obvious neuro-muscular anomalies detected.  Muscle Tone/Strength: Functionally intact. No obvious neuro-muscular anomalies detected.  Sensory (Neurological): Neuropathic pain pattern        Sensory (Neurological): Unimpaired        DTR: Patellar: deferred today Achilles: deferred today Plantar: deferred today  DTR: Patellar: deferred today Achilles: deferred today Plantar: deferred today  Palpation: No palpable anomalies  Palpation: No palpable anomalies    Assessment  Primary Diagnosis & Pertinent Problem List: The primary encounter  diagnosis was Chronic painful diabetic neuropathy ([place]). Diagnoses of Peripheral arterial disease ([place]), Chronic pain syndrome, and Intractable neuropathic pain of lower extremity, right were also pertinent to this visit.  Visit Diagnosis (New problems to examiner): 1. Chronic painful diabetic neuropathy ([place])   2. Peripheral arterial disease ([place])   3. Chronic pain syndrome   4. Intractable neuropathic pain of lower extremity, right    Plan of Care (Initial workup plan)  Note: Mr. Kenyon was reminded that as per protocol, today's visit has been an evaluation only. We have not taken over the patient's controlled substance management.  General Recommendations: The pain condition that the patient suffers from is best treated with a multidisciplinary approach that involves an increase in physical activity to prevent de-conditioning and worsening of the pain cycle, as well as psychological counseling (formal and/or informal) to address the co-morbid psychological affects of pain. Treatment will often involve judicious use of pain medications and interventional procedures to decrease the pain, allowing the patient to participate in the physical activity that will ultimately produce long-lasting pain reductions. The goal of the multidisciplinary approach is to return the patient to a higher level of overall function and to restore their ability to perform activities of daily living.  1. Chronic painful diabetic neuropathy (HCC) - gabapentin (NEURONTIN) 300 MG capsule; Take 1 capsule (300 mg total) by mouth at bedtime for 30 days, THEN 1 capsule (300 mg total) 2 (two) times daily.  Dispense: 90 capsule; Refill: 0 - NEUROLYSIS; Future  2. Peripheral arterial disease (HCC) - gabapentin (NEURONTIN) 300 MG capsule; Take 1 capsule (300 mg total) by mouth at bedtime for 30 days, THEN 1 capsule (300 mg total) 2 (two) times daily.  Dispense: 90 capsule; Refill: 0  3. Chronic pain syndrome - gabapentin  (NEURONTIN) 300 MG capsule; Take 1 capsule (300 mg total) by mouth at bedtime for 30 days, THEN 1 capsule (300 mg total) 2 (two) times daily.  Dispense: 90 capsule; Refill: 0 - NEUROLYSIS; Future - Compliance Drug Analysis, Ur  4. Intractable neuropathic pain of lower extremity, right - gabapentin (NEURONTIN) 300 MG capsule; Take 1 capsule (300 mg total) by mouth at bedtime for 30 days, THEN 1 capsule (300 mg total) 2 (two) times daily.  Dispense: 90 capsule; Refill: 0    Lab Orders         Compliance Drug Analysis, Ur      Procedure Orders         NEUROLYSIS     Pharmacotherapy (current): Medications ordered:  Meds ordered this encounter  Medications   gabapentin (NEURONTIN) 300 MG capsule    Sig: Take 1 capsule (300 mg total) by mouth at bedtime for 30 days, THEN 1 capsule (300 mg total) 2 (two) times daily.    Dispense:  90 capsule    Refill:  0   Medications administered during this visit: Hardin Negus. Carns had no medications administered during this visit.   Pharmacological management options:  Opioid Analgesics:  The patient was informed that there is no guarantee that he would be a candidate for opioid analgesics. The decision will be made following CDC guidelines. This decision will be based on the results of diagnostic studies, as well as Mr. Briseno risk profile. Avoid- consider buprenorphine  Membrane stabilizer:  Already on Cymbalta.  Trial of gabapentin as above.  Future considerations include Lyrica.  Muscle relaxant: To be determined at a later time  NSAID: Medically contraindicated  Other analgesic(s): To be determined at a later time   I spent a total of 60 minutes reviewing chart data, face-to-face evaluation with the patient, counseling and coordination of care as detailed above.   Provider-requested follow-up: Return in about 6 days (around 03/09/2022) for Qutenza Tx.  Future Appointments  Date Time Provider [place]  03/09/2022 12:40 PM Gillis Santa, MD [facility] None  08/19/2022  1:00 PM [facility] None  08/19/2022  2:00 PM Kris Hartmann, NP [facility] None    Note by: Gillis Santa, MD Date: 03/03/2022; Time: 2:39 PM

## 2022-03-09 ENCOUNTER — Other Ambulatory Visit: Payer: Self-pay

## 2022-03-09 ENCOUNTER — Encounter: Payer: Self-pay | Admitting: Student in an Organized Health Care Education/Training Program

## 2022-03-09 ENCOUNTER — Ambulatory Visit
Payer: BC Managed Care – PPO | Attending: Student in an Organized Health Care Education/Training Program | Admitting: Student in an Organized Health Care Education/Training Program

## 2022-03-09 VITALS — BP 151/58 | Temp 97.0°F | Resp 18 | Ht 67.5 in | Wt 187.0 lb

## 2022-03-09 DIAGNOSIS — G894 Chronic pain syndrome: Secondary | ICD-10-CM | POA: Diagnosis not present

## 2022-03-09 DIAGNOSIS — M792 Neuralgia and neuritis, unspecified: Secondary | ICD-10-CM | POA: Insufficient documentation

## 2022-03-09 DIAGNOSIS — E1140 Type 2 diabetes mellitus with diabetic neuropathy, unspecified: Secondary | ICD-10-CM | POA: Diagnosis not present

## 2022-03-09 LAB — COMPLIANCE DRUG ANALYSIS, UR

## 2022-03-09 MED ORDER — CAPSAICIN-CLEANSING GEL 8 % EX KIT
2.0000 | PACK | Freq: Once | CUTANEOUS | Status: AC
  Administered 2022-03-09: 2 via TOPICAL
  Filled 2022-03-09: qty 2

## 2022-03-09 NOTE — Progress Notes (Deleted)
Safety precautions to be maintained throughout the outpatient stay will include: orient to surroundings, keep bed in low position, maintain call bell within reach at all times, provide assistance with transfer out of bed and ambulation.  

## 2022-03-09 NOTE — Progress Notes (Signed)
PROVIDER NOTE: Interpretation of information contained herein should be left to medically-trained personnel. Specific patient instructions are provided elsewhere under "Patient Instructions" section of medical record. This document was created in part using STT-dictation technology, any transcriptional errors that may result from this process are unintentional.  Patient: Damon English Type: Established DOB: 09-14-1951 MRN: [id number] PCP: [name], PA-C  Service: Procedure DOS: 03/09/2022 Setting: Ambulatory Location: Ambulatory outpatient facility Delivery: Face-to-face Provider: Gillis Santa, MD Specialty: Interventional Pain Management Specialty designation: 09 Location: Outpatient facility Ref. Prov.: [name], PA-C    Primary Reason for Visit: Interventional Pain Management Treatment. CC: Foot Pain (right)    Procedure:          Qutenza treatment for right foot painful diabetic neuropathy.   1. Chronic painful diabetic neuropathy ([place])   2. Intractable neuropathic pain of lower extremity, right   3. Chronic pain syndrome    NAS-11 Pain score:   Pre-procedure: 3 /10   Post-procedure: 4  (moving and walking)/10     Pre-op H&P Assessment:  Damon English is a 71 y.o. (year old), male patient, seen today for interventional treatment. 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 Damon English has a current medication list which includes the following prescription(s): acetaminophen, acidophilus, apixaban, atorvastatin, brimonidine,  clonazepam, diclofenac sodium, docusate sodium, duloxetine, esomeprazole, famotidine, ferrous sulfate, folic acid, furosemide, gabapentin, glipizide, guaifenesin, isosorbide mononitrate, ketoconazole, latanoprost, metoprolol succinate, nitroglycerin, omeprazole, ondansetron, polyethylene glycol, potassium chloride sa, ranolazine, sennosides, spironolactone, sucralfate, thiamine, enstilar, cephalexin, chlorhexidine gluconate cloth, clopidogrel, losartan, magnesium, multivitamin with minerals, and phenol. 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   Weight as of this encounter: 187 lb (84.8 kg).  Risk Assessment: Allergies: Reviewed. He is allergic to amoxicillin, hydromorphone hcl, and aspirin.  Allergy Precautions: None required Coagulopathies: Reviewed. None identified.  Blood-thinner therapy: None at this time Active Infection(s): Reviewed. None identified. Damon English is afebrile  Site Confirmation: Damon English was asked to confirm the procedure and laterality before marking the site Procedure checklist: Completed Consent: Before the procedure and under the influence of no sedative(s), amnesic(s), or anxiolytics, the patient was informed of the treatment options, risks and possible complications. To fulfill our ethical and legal obligations, as recommended by the American Medical Association's Code of Ethics, I have informed the patient of my clinical impression; the nature and purpose of the treatment or procedure; the risks, benefits, and possible complications of the intervention; the alternatives, including doing nothing; the risk(s) and benefit(s) of the alternative treatment(s) or procedure(s); and the  risk(s) and benefit(s) of doing nothing. The patient was provided information about the general risks and possible complications associated with the procedure. These may include, but are not limited to: failure to achieve desired goals, infection, bleeding, organ or nerve damage, allergic reactions, paralysis, and death. In addition, the patient was informed of those risks and complications associated to  the procedure, such as failure to decrease pain; infection; bleeding; organ or nerve damage with subsequent damage to sensory, motor, and/or autonomic systems, resulting in permanent pain, numbness, and/or weakness of one or several areas of the body; allergic reactions; (i.e.: anaphylactic reaction); and/or death. Furthermore, the patient was informed of those risks and complications associated with the medications. These include, but are not limited to: allergic reactions (i.e.: anaphylactic or anaphylactoid reaction(s)); adrenal axis suppression; blood sugar elevation that in diabetics may result in ketoacidosis or comma; water retention that in patients with history of congestive heart failure may result in shortness of breath, pulmonary edema, and decompensation with resultant heart failure; weight gain; swelling or edema; medication-induced neural toxicity; particulate matter embolism and blood vessel occlusion with resultant organ, and/or nervous system infarction; and/or aseptic necrosis of one or more joints. Finally, the patient was informed that Medicine is not an exact science; therefore, there is also the possibility of unforeseen or unpredictable risks and/or possible complications that may result in a catastrophic outcome. The patient indicated having understood very clearly. We have given the patient no guarantees and we have made no promises. Enough time was given to the patient to ask questions, all of which were answered to the patient's satisfaction. Damon English has indicated that he wanted  to continue with the procedure. Attestation: I, the ordering provider, attest that I have discussed with the patient the benefits, risks, side-effects, alternatives, likelihood of achieving goals, and potential problems during recovery for the procedure that I have provided informed consent. Date  Time: 03/09/2022  1:00 PM  Pre-Procedure Preparation:  Monitoring: As per clinic protocol. Respiration, ETCO2, SpO2, BP, heart rate and rhythm monitor placed and checked for adequate function Safety Precautions: Patient was assessed for positional comfort and pressure points before starting the procedure. Time-out: I initiated and conducted the "Time-out" before starting the procedure, as per protocol. The patient was asked to participate by confirming the accuracy of the "Time Out" information. Verification of the correct person, site, and procedure were performed and confirmed by me, the nursing staff, and the patient. "Time-out" conducted as per Joint Commission's Universal Protocol (UP.01.01.01). Time: 1329  Description of Procedure:                              Vitals:   03/09/22 1309  BP: (!) 151/58  Resp: 18  Temp: (!) 97 F (36.1 C)  TempSrc: Temporal  Weight: 187 lb (84.8 kg)  Height: 5' 7.5" (1.715 m)    Start Time: 1330 hrs. End Time: 1415 hrs.  Qutenza patch applied to the plantar and dorsal surface of the right foot.     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  Note: The patient tolerated the entire procedure well. A repeat set of vitals were taken after the procedure and the patient was kept under observation following institutional policy, for this type of procedure. Post-procedural neurological assessment was  performed, showing return to baseline, prior to discharge. The patient was provided with post-procedure discharge instructions, including a section on how to identify potential problems. Should any problems arise concerning this procedure, the patient was given instructions to immediately contact us, at any time, without  hesitation. In any case, we plan to contact the patient by telephone for a follow-up status report regarding this interventional procedure.  Comments:  No additional relevant information.  Plan of Care     Medications ordered for procedure: Meds ordered this encounter  Medications   capsaicin topical system 8 % patch 2 patch   Medications administered: We administered capsaicin topical system.  See the medical record for exact dosing, route, and time of administration.  Follow-up plan:   Return in about 4 weeks (around 04/06/2022) for Post Procedure Evaluation, virtual.       Qutenza 03/09/22   Recent Visits Date Type Provider Dept  03/03/22 Office Visit Gillis Santa, MD [facility]  Showing recent visits within past 90 days and meeting all other requirements Today's Visits Date Type Provider Dept  03/09/22 Procedure visit Gillis Santa, MD [facility]  Showing today's visits and meeting all other requirements Future Appointments Date Type Provider Dept  04/26/22 Appointment Gillis Santa, MD [facility]  Showing future appointments within next 90 days and meeting all other requirements  Disposition: Discharge home  Discharge (Date  Time): 03/09/2022; 1423 hrs.   Primary Care Physician: [name], PA-C Location: [facility] Note by: Gillis Santa, MD Date: 03/09/2022; Time: 2:35 PM  Disclaimer:  Medicine is not an exact science. The only guarantee in medicine is that nothing is guaranteed. It is important to note that the decision to proceed with this intervention was based on the information  collected from the patient. The Data and conclusions were drawn from the patient's questionnaire, the interview, and the physical examination. Because the information was provided in large part by the patient, it cannot be guaranteed that it has not been purposely or unconsciously manipulated. Every effort has been made to obtain as much relevant data as possible for this evaluation. It is important to note that the conclusions that lead to this procedure are derived in large part from the available data. Always take into account that the treatment will also be dependent on availability of resources and existing treatment guidelines, considered by other Pain Management Practitioners as being common knowledge and practice, at the time of the intervention. For Medico-Legal purposes, it is also important to point out that variation in procedural techniques and pharmacological choices are the acceptable norm. The indications, contraindications, technique, and results of the above procedure should only be interpreted and judged by a Board-Certified Interventional Pain Specialist with extensive familiarity and expertise in the same exact procedure and technique.

## 2022-03-09 NOTE — Progress Notes (Signed)
Safety precautions to be maintained throughout the outpatient stay will include: orient to surroundings, keep bed in low position, maintain call bell within reach at all times, provide assistance with transfer out of bed and ambulation.  

## 2022-03-09 NOTE — Progress Notes (Signed)
Cleansed patients foot with cooling gel and sterile towels.Washed between toes and entire right foot. Informed patient that we will call him tomorrow and for him to call with any questions or concerns. Patient with understanding.

## 2022-03-10 ENCOUNTER — Telehealth: Payer: Self-pay

## 2022-03-10 NOTE — Telephone Encounter (Signed)
Post procedure phone call.  Patients wife states he is doing good.

## 2022-03-16 DIAGNOSIS — K625 Hemorrhage of anus and rectum: Secondary | ICD-10-CM | POA: Diagnosis not present

## 2022-03-16 DIAGNOSIS — M792 Neuralgia and neuritis, unspecified: Secondary | ICD-10-CM | POA: Diagnosis not present

## 2022-03-16 DIAGNOSIS — F419 Anxiety disorder, unspecified: Secondary | ICD-10-CM | POA: Diagnosis not present

## 2022-03-16 DIAGNOSIS — K604 Rectal fistula: Secondary | ICD-10-CM | POA: Diagnosis not present

## 2022-03-23 ENCOUNTER — Telehealth: Payer: BC Managed Care – PPO | Admitting: Student in an Organized Health Care Education/Training Program

## 2022-03-29 ENCOUNTER — Encounter: Payer: Self-pay | Admitting: Surgery

## 2022-03-29 ENCOUNTER — Ambulatory Visit: Payer: BC Managed Care – PPO | Admitting: Surgery

## 2022-03-29 DIAGNOSIS — K603 Anal fistula: Secondary | ICD-10-CM

## 2022-03-29 NOTE — Patient Instructions (Addendum)
Keep your stools soft. You may need to take a fiber supplement like Benefiber or Metamucil. You may also augment with Miralax.  Continue to monitor the area. You may use warm compresses to encourage any draining. You may also do sitz baths(warm soaks) to keep the area clean after a bowel movement to encourage healing.     Follow up here in one month.
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
## 2022-04-14 DIAGNOSIS — Z79899 Other long term (current) drug therapy: Secondary | ICD-10-CM | POA: Diagnosis not present

## 2022-04-14 DIAGNOSIS — I442 Atrioventricular block, complete: Secondary | ICD-10-CM | POA: Diagnosis not present

## 2022-04-14 DIAGNOSIS — I739 Peripheral vascular disease, unspecified: Secondary | ICD-10-CM | POA: Diagnosis not present

## 2022-04-14 DIAGNOSIS — I10 Essential (primary) hypertension: Secondary | ICD-10-CM | POA: Diagnosis not present

## 2022-04-14 DIAGNOSIS — Z86711 Personal history of pulmonary embolism: Secondary | ICD-10-CM | POA: Diagnosis not present

## 2022-04-14 DIAGNOSIS — Z7901 Long term (current) use of anticoagulants: Secondary | ICD-10-CM | POA: Diagnosis not present

## 2022-04-14 DIAGNOSIS — I25810 Atherosclerosis of coronary artery bypass graft(s) without angina pectoris: Secondary | ICD-10-CM | POA: Diagnosis not present

## 2022-04-14 DIAGNOSIS — Z87891 Personal history of nicotine dependence: Secondary | ICD-10-CM | POA: Diagnosis not present

## 2022-04-14 DIAGNOSIS — F419 Anxiety disorder, unspecified: Secondary | ICD-10-CM | POA: Diagnosis not present

## 2022-04-14 DIAGNOSIS — E782 Mixed hyperlipidemia: Secondary | ICD-10-CM | POA: Diagnosis not present

## 2022-04-14 DIAGNOSIS — I255 Ischemic cardiomyopathy: Secondary | ICD-10-CM | POA: Diagnosis not present

## 2022-04-14 DIAGNOSIS — Z7902 Long term (current) use of antithrombotics/antiplatelets: Secondary | ICD-10-CM | POA: Diagnosis not present

## 2022-04-14 DIAGNOSIS — I110 Hypertensive heart disease with heart failure: Secondary | ICD-10-CM | POA: Diagnosis not present

## 2022-04-14 DIAGNOSIS — R0602 Shortness of breath: Secondary | ICD-10-CM | POA: Diagnosis not present

## 2022-04-14 DIAGNOSIS — M2550 Pain in unspecified joint: Secondary | ICD-10-CM | POA: Diagnosis not present

## 2022-04-20 DIAGNOSIS — Z950 Presence of cardiac pacemaker: Secondary | ICD-10-CM | POA: Diagnosis not present

## 2022-04-21 DIAGNOSIS — H401132 Primary open-angle glaucoma, bilateral, moderate stage: Secondary | ICD-10-CM | POA: Diagnosis not present

## 2022-04-21 DIAGNOSIS — E119 Type 2 diabetes mellitus without complications: Secondary | ICD-10-CM | POA: Diagnosis not present

## 2022-04-25 ENCOUNTER — Telehealth: Payer: Self-pay

## 2022-04-25 NOTE — Telephone Encounter (Signed)
LM for patient to call office for pre virtual appointment questions.  

## 2022-04-26 ENCOUNTER — Ambulatory Visit
Payer: BC Managed Care – PPO | Attending: Student in an Organized Health Care Education/Training Program | Admitting: Student in an Organized Health Care Education/Training Program

## 2022-04-26 ENCOUNTER — Encounter: Payer: Self-pay | Admitting: Student in an Organized Health Care Education/Training Program

## 2022-04-26 DIAGNOSIS — G894 Chronic pain syndrome: Secondary | ICD-10-CM | POA: Diagnosis not present

## 2022-04-26 DIAGNOSIS — E1140 Type 2 diabetes mellitus with diabetic neuropathy, unspecified: Secondary | ICD-10-CM

## 2022-04-26 DIAGNOSIS — I739 Peripheral vascular disease, unspecified: Secondary | ICD-10-CM | POA: Diagnosis not present

## 2022-04-26 DIAGNOSIS — M792 Neuralgia and neuritis, unspecified: Secondary | ICD-10-CM | POA: Diagnosis not present

## 2022-04-26 NOTE — Progress Notes (Signed)
Patient: Damon English  Service Category: E/M  Provider: Gillis Santa, MD  DOB: 1951/07/06  DOS: 04/26/2022  Location: Office  MRN: [id number]  Setting: Ambulatory outpatient  Referring Provider: [name], PA-C  Type: Established Patient  Specialty: Interventional Pain Management  PCP: [name], PA-C  Location: Remote location  Delivery: TeleHealth     Virtual Encounter - Pain Management PROVIDER NOTE: Information contained herein reflects review and annotations entered in association with encounter. Interpretation of such information and data should be left to medically-trained personnel. Information provided to patient can be located elsewhere in the medical record under "Patient Instructions". Document created using STT-dictation technology, any transcriptional errors that may result from process are unintentional.    Contact & Pharmacy Preferred: [phone] Home: [phone] (home) Mobile: [phone] (mobile) E-mail: [email or web address]  [id number] [place] [address]Phone: [phone]: [phone]  Pre-screening  [name] "in-person" vs "virtual" encounter. He indicated preferring virtual for this encounter.   Reason COVID-19*  Social distancing based on CDC and AMA recommendations.   I contacted Damon English 04/26/2022 via telephone.      I clearly identified myself as BGillis Santa MD. I verified that I was speaking with the correct person using two identifiers (Name: Damon English and date of birth: [birth year].  Consent I sought verbal advanced consent from Damon Hamburgfor virtual visit interactions. I informed Damon English possible security and privacy concerns, risks, and limitations associated with providing "not-in-person" medical evaluation and management services. I also informed Damon English the availability of "in-person" appointments. Finally,  I informed him that there would be a charge for the virtual visit and that he could be  personally, fully or partially, financially responsible for it. Damon English understanding and agreed to proceed.   Historic Elements   Damon English a 71y.o. year old, male patient evaluated today after our last contact on 03/09/2022. Damon English has a past medical history of Alcohol use, Diabetes mellitus without complication ([place], History of tobacco use, Hypertension, Pulmonary embolism ([place] (2012), and PVD (peripheral vascular disease) ([place]. He also  has a past surgical history that includes Vascular surgery; Cholecystectomy; Back surgery; Femoral artery - popliteal artery bypass graft (Right, 06/19/2018); ERCP w/ sphicterotomy (2015); Thromboendarterectomy (Right, 06/19/2018); Tonsillectomy; CORONARY ANGIOGRAPHY (N/A, 01/30/2019); Lower Extremity Angiography (Left, 01/14/2021); LEFT HEART CATH AND CORS/GRAFTS ANGIOGRAPHY (N/A, 06/25/2021); TEMPORARY PACEMAKER (N/A, 07/11/2021); and Lower Extremity Angiography (Left, 11/04/2021). Damon English a current medication list which includes the following prescription(s): acetaminophen, apixaban, atorvastatin, enstilar, clonazepam, clopidogrel, diclofenac sodium, duloxetine, famotidine, ferrous sulfate, furosemide, gabapentin, glipizide, guaifenesin, latanoprost, losartan, metoprolol succinate, nitroglycerin, polyethylene glycol, potassium chloride sa, sennosides, spironolactone, and thiamine. He  reports that he quit smoking about 11 years ago. His smoking use included cigarettes. He has a 80.00 pack-year smoking history. He has never used smokeless tobacco. He reports current alcohol use of about 7.0 standard drinks of alcohol per week. He reports that he does not use drugs. Damon English allergic to amoxicillin, hydromorphone hcl, and aspirin.   HPI  Today, he is being contacted for a post-procedure assessment.   Post-procedure evaluation     Procedure:          Qutenza treatment for right foot painful diabetic neuropathy.   1. Chronic painful diabetic neuropathy (Damon English   2. Intractable neuropathic pain of lower extremity, right  3. Chronic pain syndrome    NAS-11 Pain score:   Pre-procedure: 3 /10   Post-procedure: 4  (moving and walking)/10      Effectiveness:  Initial hour after procedure: 75 %  Subsequent 4-6 hours post-procedure: 75 %  Analgesia past initial 6 hours: 75 % (lastinig 2 weeks)   Ongoing improvement:  Analgesic: 40% Function: Back to baseline      Laboratory Chemistry Profile   Renal Lab Results  Component Value Date   BUN 30 (H) 11/04/2021   CREATININE 1.53 (H) 11/04/2021   GFRAA >60 01/30/2019   GFRNONAA 49 (L) 11/04/2021    Hepatic Lab Results  Component Value Date   AST 28 07/11/2021   ALT 26 07/11/2021   ALBUMIN 3.8 07/11/2021   ALKPHOS 121 07/11/2021   LIPASE 31 01/28/2019    Electrolytes Lab Results  Component Value Date   NA 135 07/11/2021   K 4.2 07/11/2021   CL 100 07/11/2021   CALCIUM 9.5 07/11/2021   MG 1.9 07/11/2021   PHOS 4.2 06/25/2021    Bone No results found for: "VD25OH", "VD125OH2TOT", "SE3953UY2", "BX4356YS1", "25OHVITD1", "25OHVITD2", "25OHVITD3", "TESTOFREE", "TESTOSTERONE"  Inflammation (CRP: Acute Phase) (ESR: Chronic Phase) Lab Results  Component Value Date   LATICACIDVEN 1.3 06/19/2021         Note: Above Lab results reviewed.  Imaging  VAS [place] LOWER EXTREMITY ARTERIAL DUPLEX LOWER EXTREMITY ARTERIAL DUPLEX STUDY  Patient Name:  Damon English  Date of Exam:   02/15/2022 Medical Rec #: [id number]         Accession #:    [id number] Date of Birth: [birth year]          Patient Gender: M Patient Age:   71 years Exam Location:  [facility] Procedure:      VAS [place] LOWER EXTREMITY ARTERIAL DUPLEX Referring Phys: Eulogio Ditch  --------------------------------------------------------------------------------   Indications: Peripheral  artery disease.   Vascular Interventions: 01/14/21: Bilateral CIA & right EIA stents;                         11/04/21: Left SFA-popliteal stent;. Current ABI:            Right=1.0 & Left=1.01  Performing Technologist: Blondell Reveal RT, RDMS, RVT    Examination Guidelines: A complete evaluation includes Damon-mode imaging, spectral Doppler, color Doppler, and power Doppler as needed of all accessible portions of each vessel. Bilateral testing is considered an integral part of a complete examination. Limited examinations for reoccurring indications may be performed as noted.       +--------------+-------+-----------+--------+--------+-----+--------+ Left PoplitealAP (cm)Transv (cm)WaveformStenosisShapeComments +--------------+-------+-----------+--------+--------+-----+--------+ Distal                                                        +--------------+-------+-----------+--------+--------+-----+--------+    +-----------+--------+-----+--------+--------+--------+ LEFT       PSV cm/sRatioStenosisWaveformComments +-----------+--------+-----+--------+--------+--------+ CFA Mid    74                   biphasic         +-----------+--------+-----+--------+--------+--------+ DFA        325                  biphasic         +-----------+--------+-----+--------+--------+--------+ SFA Prox   64  biphasic         +-----------+--------+-----+--------+--------+--------+ SFA Mid    62                   biphasic         +-----------+--------+-----+--------+--------+--------+ SFA Distal 96                   biphasic         +-----------+--------+-----+--------+--------+--------+ POP Prox   112                  biphasic         +-----------+--------+-----+--------+--------+--------+ POP Distal 70                   biphasic         +-----------+--------+-----+--------+--------+--------+ ATA Distal 29                    biphasic         +-----------+--------+-----+--------+--------+--------+ PTA Distal 54                   biphasic         +-----------+--------+-----+--------+--------+--------+ PERO Distal55                   biphasic         +-----------+--------+-----+--------+--------+--------+    Summary: Left: Patent left lower extremity arterial system. Elevated velocity at the profunda femoral origin suggestive of >50% stenosis.    See table(s) above for measurements and observations.  Electronically signed by [name] MD on 02/17/2022 at 3:10:22 PM.       Final   VAS [place] ABI WITH/WO TBI  LOWER EXTREMITY DOPPLER STUDY  Patient Name:  CRIS TALAVERA  Date of Exam:   02/15/2022 Medical Rec #: [id number]         Accession #:    [id number] Date of Birth: 05/11/1951          Patient Gender: M Patient Age:   72 years Exam Location:  [facility] Procedure:      VAS [place] ABI WITH/WO TBI Referring Phys: Eulogio Ditch  --------------------------------------------------------------------------------   Indications: Peripheral artery disease.   Vascular Interventions: 01/14/21: Bilateral CIA & right EIA stents;                         11/04/21: Left SFA-popliteal stent;.  Performing Technologist: Blondell Reveal RT, RDMS, RVT    Examination Guidelines: A complete evaluation includes at minimum, Doppler waveform signals and systolic blood pressure reading at the level of bilateral brachial, anterior tibial, and posterior tibial arteries, when vessel segments are accessible. Bilateral testing is considered an integral part of a complete examination. Photoelectric Plethysmograph (PPG) waveforms and toe systolic pressure readings are included as required and additional duplex testing as needed. Limited examinations for reoccurring indications may be performed as noted.    ABI Findings: +---------+------------------+-----+--------+--------+ Right    Rt  Pressure (mmHg)IndexWaveformComment  +---------+------------------+-----+--------+--------+ Brachial 148                                     +---------+------------------+-----+--------+--------+ ATA      145               0.98 biphasic         +---------+------------------+-----+--------+--------+ PTA      148  1.00 biphasic         +---------+------------------+-----+--------+--------+ Great Toe104               0.70 Normal           +---------+------------------+-----+--------+--------+  +---------+------------------+-----+--------+-------+ Left     Lt Pressure (mmHg)IndexWaveformComment +---------+------------------+-----+--------+-------+ Brachial 140                                    +---------+------------------+-----+--------+-------+ ATA      139               0.94 biphasic        +---------+------------------+-----+--------+-------+ PTA      150               1.01 biphasic        +---------+------------------+-----+--------+-------+ Great Toe73                0.49 Normal          +---------+------------------+-----+--------+-------+  +-------+-----------+-----------+------------+------------+ ABI/TBIToday's ABIToday's TBIPrevious ABIPrevious TBI +-------+-----------+-----------+------------+------------+ Right  1.0        0.70       1.17        0.57         +-------+-----------+-----------+------------+------------+ Left   1.01       0.49       1.16        0.83         +-------+-----------+-----------+------------+------------+  ADDITIONAL NOTE: Normal biphasic Doppler and normal PPG waveforms noted in the bilateral upper extremities.   Bilateral ABIs appear essentially unchanged compared to prior study on 11/18/21.   Summary: Right: Resting right ankle-brachial index is within normal range. No evidence of significant right lower extremity arterial disease. The right toe-brachial  index is normal.  Left: Resting left ankle-brachial index is within normal range. No evidence of significant left lower extremity arterial disease. The left toe-brachial index is abnormal.  *See table(s) above for measurements and observations.    Electronically signed by [name] MD on 02/17/2022 at 3:10:08 PM.      Final    Assessment  The primary encounter diagnosis was Chronic painful diabetic neuropathy ([place]). Diagnoses of Intractable neuropathic pain of lower extremity, right, Peripheral arterial disease ([place]), and Chronic pain syndrome were also pertinent to this visit.  Plan of Care    Positive analgesic and functional response to Qutenza treatment.  Endorses improvement in his ability to walk and less intense neuropathic pain of bilateral feet.  He would like to repeat this.  I informed him that we can do this every 3 months but not sooner.  I will plan on seeing him at the end of August for Qutenza No. 2.  Orders:  Orders Placed This Encounter  Procedures   NEUROLYSIS    Please order Qutenza patches from pharmacy    Standing Status:   Future    Standing Expiration Date:   07/27/2022    Order Specific Question:   Where will this procedure be performed?    Answer:   [facility]   Follow-up plan:   Return in about 7 weeks (around 06/16/2022) for Qutenza #2.     Qutenza 03/09/22    Recent Visits Date Type Provider Dept  03/09/22 Procedure visit Gillis Santa, MD [facility]  03/03/22 Office Visit Gillis Santa, MD [facility]  Showing recent visits within past 90 days and meeting all other requirements  Today's Visits Date Type Provider Dept  04/26/22 Office Visit Gillis Santa, MD [facility]  Showing today's visits and meeting all other requirements Future Appointments No visits were found meeting these conditions. Showing future appointments within next 90 days and meeting all other requirements  I discussed the assessment  and treatment plan with the patient. The patient was provided an opportunity to ask questions and all were answered. The patient agreed with the plan and demonstrated an understanding of the instructions.  Patient advised to call back or seek an in-person evaluation if the symptoms or condition worsens.  Duration of encounter: 20 minutes.  Note by: Gillis Santa, MD Date: 04/26/2022; Time: 3:27 PM

## 2022-04-28 ENCOUNTER — Ambulatory Visit (INDEPENDENT_AMBULATORY_CARE_PROVIDER_SITE_OTHER): Payer: BC Managed Care – PPO | Admitting: Surgery

## 2022-04-28 ENCOUNTER — Encounter: Payer: Self-pay | Admitting: Surgery

## 2022-04-28 VITALS — BP 116/71 | HR 66 | Temp 98.5°F | Wt 185.4 lb

## 2022-04-28 DIAGNOSIS — L929 Granulomatous disorder of the skin and subcutaneous tissue, unspecified: Secondary | ICD-10-CM

## 2022-04-28 NOTE — Patient Instructions (Addendum)
If you have any concerns or questions, please feel free to call our office. See follow up appointment below.   Perianal Dermatitis, Adult Perianal dermatitis is inflammation of the skin around the anus. This condition causes patches of red, irritated skin that may be itchy or painful. This condition is contagious. This means the condition can be passed from one person to another, depending on what caused it. What are the causes? This condition may be caused by: Contact with an irritant, such as stool, urine, mucus, soap, or sweat. Bacteria, especially certain types of strep bacteria. Swollen veins in the anus (hemorrhoids). Fungus. Skin conditions, such as psoriasis. Medical conditions, such as diabetes and Crohn's disease. Certain foods, especially spicy and acidic foods, and caffeinated beverages. An opening between the rectum and the skin around the anus (fistula). What increases the risk? This condition is more likely to develop in: People who are unable to control when they urinate or have a bowel movement (are incontinent). People who have trouble walking or moving around. People with thin or fragile skin. People who are taking antibiotic medicines or steroids. What are the signs or symptoms? Itchy, red patches of skin are the main symptom of this condition. The skin in this area may also be swollen and have: Cracks or tears. Small, raised bumps. Small, pus-filled blisters. Other symptoms include: Pain when passing stools. Blood in the stool. Itching around the anus. Tenderness around the anus. Holding back stools to avoid pain. How is this diagnosed? This condition is diagnosed with a medical history and physical exam. The diagnosis is confirmed by testing a sample of the skin for bacteria or fungus. How is this treated? Treatment for this condition depends on the cause. Mild cases may go away without treatment when contact with the irritant is stopped. Treatment for more  severe cases of perianal dermatitis may include medicines, such as: A waterproof barrier cream. Topical or oral corticosteroids to ease skin inflammation. Antihistamines to relieve itching. Antibiotics to treat a bacterial infection. Antifungal cream to treat a fungal infection. Severe fungal infections may also be treated with topical steroids and medicine to control itching. Follow these instructions at home:  Take over-the-counter and prescription medicines only as told by your health care provider. If you were prescribed an antibiotic medicine, use it as told by your health care provider. Do not stop using the antibiotic even if you start to feel better. Do not scratch the affected area. Wash your hands carefully with soap and warm water for at least 20 seconds after each time that you use the bathroom. If soap and water are not available, use hand sanitizer. Make sure other people in your household wash their hands frequently as well. Keep all follow-up visits. This is important. How is this prevented? Avoid contact with any substances that cause perianal inflammation. Keep the perianal area clean and dry. Contact a health care provider if: Your symptoms do not improve with treatment. Your symptoms get worse. Your symptoms go away and then return. You are not able to control your bowel movements or you are leaking stool (fecal incontinence). You have a fever. Get help right away if: You frequently pass blood in your stools. You lose weight and you do not know why. You have fluid, blood, or pus coming from the rash site. Summary Perianal dermatitis is inflammation of the skin around the anus. Itchy, red patches of skin are the main symptom of this condition. Treatment for this condition depends on the cause.  Mild cases may go away without treatment when contact with the irritant is stopped. Treatment for more severe cases may include medicines. Wash your hands carefully with soap  and warm water for at least 20 seconds after each time that you use the bathroom. If soap and water are not available, use hand sanitizer. Keep the perianal area clean and dry to prevent this condition. This information is not intended to replace advice given to you by your health care provider. Make sure you discuss any questions you have with your health care provider. Document Revised: 06/10/2020 Document Reviewed: 06/10/2020 Elsevier Patient Education  [place].
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
## 2022-05-02 DIAGNOSIS — Z950 Presence of cardiac pacemaker: Secondary | ICD-10-CM | POA: Diagnosis not present

## 2022-05-13 DIAGNOSIS — M792 Neuralgia and neuritis, unspecified: Secondary | ICD-10-CM | POA: Diagnosis not present

## 2022-05-13 DIAGNOSIS — E119 Type 2 diabetes mellitus without complications: Secondary | ICD-10-CM | POA: Diagnosis not present

## 2022-05-13 DIAGNOSIS — I509 Heart failure, unspecified: Secondary | ICD-10-CM | POA: Diagnosis not present

## 2022-05-13 DIAGNOSIS — K604 Rectal fistula: Secondary | ICD-10-CM | POA: Diagnosis not present

## 2022-05-16 ENCOUNTER — Other Ambulatory Visit: Payer: Self-pay | Admitting: Student in an Organized Health Care Education/Training Program

## 2022-05-16 DIAGNOSIS — G894 Chronic pain syndrome: Secondary | ICD-10-CM

## 2022-05-16 DIAGNOSIS — M792 Neuralgia and neuritis, unspecified: Secondary | ICD-10-CM

## 2022-05-16 DIAGNOSIS — I739 Peripheral vascular disease, unspecified: Secondary | ICD-10-CM

## 2022-05-16 DIAGNOSIS — E1140 Type 2 diabetes mellitus with diabetic neuropathy, unspecified: Secondary | ICD-10-CM

## 2022-05-26 DIAGNOSIS — I10 Essential (primary) hypertension: Secondary | ICD-10-CM | POA: Diagnosis not present

## 2022-05-26 DIAGNOSIS — R0609 Other forms of dyspnea: Secondary | ICD-10-CM | POA: Diagnosis not present

## 2022-05-26 DIAGNOSIS — I25118 Atherosclerotic heart disease of native coronary artery with other forms of angina pectoris: Secondary | ICD-10-CM | POA: Diagnosis not present

## 2022-05-26 DIAGNOSIS — I255 Ischemic cardiomyopathy: Secondary | ICD-10-CM | POA: Diagnosis not present

## 2022-06-02 ENCOUNTER — Encounter: Payer: Self-pay | Admitting: Surgery

## 2022-06-02 ENCOUNTER — Ambulatory Visit (INDEPENDENT_AMBULATORY_CARE_PROVIDER_SITE_OTHER): Payer: BC Managed Care – PPO | Admitting: Surgery

## 2022-06-02 VITALS — BP 157/75 | HR 72 | Temp 97.8°F | Ht 67.0 in | Wt 189.4 lb

## 2022-06-02 DIAGNOSIS — K603 Anal fistula: Secondary | ICD-10-CM

## 2022-06-02 DIAGNOSIS — L929 Granulomatous disorder of the skin and subcutaneous tissue, unspecified: Secondary | ICD-10-CM

## 2022-06-02 NOTE — Patient Instructions (Addendum)
If you have any concerns or questions, please feel free to call our office. Follow up as needed.    Anorectal Abscess An abscess is an infected area that contains a collection of pus. An anorectal abscess is an abscess that is near the opening of the anus or around the rectum. Without treatment, an anorectal abscess can become larger and cause other problems, such as a more serious body-wide infection or pain, especially during bowel movements. What are the causes? This condition is caused by plugged glands or an infection in one of these areas: The anus. The area between the anus and the scrotum in males or between the anus and the vagina in females (perineum). What increases the risk? The following factors may make you more likely to develop this condition: Diabetes or inflammatory bowel disease. Having a body defense system (immune system) that is weak. Engaging in anal sex. Having a sexually transmitted infection (STI). Certain kinds of cancer, such as rectal carcinoma, leukemia, or lymphoma. What are the signs or symptoms? The main symptom of this condition is pain. The pain may be a throbbing pain that gets worse during bowel movements. Other symptoms include: Swelling and redness in the area of the abscess. The redness may go beyond the abscess and appear as a red streak on the skin. A visible, painful lump, or a lump that can be felt when touched. Bleeding or pus-like discharge from the area. Fever. General weakness. Constipation. Diarrhea. How is this diagnosed? This condition is diagnosed based on your medical history and a physical exam of the affected area. This may involve examining the rectal area with a gloved hand (digital rectal exam). Sometimes, the health care provider needs to look into the rectum using a probe, scope, or imaging test. For women, it may require a careful vaginal exam. How is this treated? Treatment for this condition may include: Incision and  drainage surgery. This involves making an incision over the abscess to drain the pus. Medicines, including antibiotic medicine, pain medicine, stool softeners, or laxatives. Follow these instructions at home: Medicines Take over-the-counter and prescription medicines only as told by your health care provider. If you were prescribed an antibiotic medicine, use it as told by your health care provider. Do not stop using the antibiotic even if you start to feel better. Do not drive or use heavy machinery while taking prescription pain medicine. Wound care  If gauze was used in the abscess, follow instructions from your health care provider about removing or changing the gauze. It can usually be removed in 2-3 days. Wash your hands with soap and water before you remove or change your gauze. If soap and water are not available, use hand sanitizer. If one or more drains were placed in the abscess cavity, be careful not to pull at them. Your health care provider will tell you how long they need to remain in place. Check your incision area every day for signs of infection. Check for: More redness, swelling, or pain. More fluid or blood. Warmth. Pus or a bad smell. Managing pain, stiffness, and swelling  Take a sitz bath 3-4 times a day and after bowel movements. This will help reduce pain and swelling. To relieve pain, try sitting: On a heating pad with the setting on low. On an inflatable donut-shaped cushion. If directed, put ice on the affected area: Put ice in a plastic bag. Place a towel between your skin and the bag. Leave the ice on for 20 minutes, 2-3  times a day. General instructions Follow any diet instructions given by your health care provider. Keep all follow-up visits as told by your health care provider. This is important. Contact a health care provider if you have: Bleeding from your incision. Pain, swelling, or redness that does not improve or gets worse. Trouble passing  stool or urine. Symptoms that return after treatment. Get help right away if you: Have problems moving or using your legs. Have severe or increasing pain. Have swelling in the affected area that suddenly gets worse. Have a large increase in bleeding or passing of pus. Develop chills or a fever. Summary An anorectal abscess is an abscess that is near the opening of the anus or around the rectum. An abscess is an infected area that contains a collection of pus. The main symptom of this condition is pain. It may be a throbbing pain that gets worse during bowel movements. Treatment for an anorectal abscess may include surgery to drain the pus from the abscess. Medicines and sitz baths may also be a part of your treatment plan. This information is not intended to replace advice given to you by your health care provider. Make sure you discuss any questions you have with your health care provider. Document Revised: 02/16/2022 Document Reviewed: 04/21/2021 Elsevier Patient Education  [employer].
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
## 2022-06-07 ENCOUNTER — Telehealth: Payer: Self-pay | Admitting: Student in an Organized Health Care Education/Training Program

## 2022-06-07 ENCOUNTER — Other Ambulatory Visit: Payer: Self-pay | Admitting: *Deleted

## 2022-06-07 DIAGNOSIS — G894 Chronic pain syndrome: Secondary | ICD-10-CM

## 2022-06-07 DIAGNOSIS — M792 Neuralgia and neuritis, unspecified: Secondary | ICD-10-CM

## 2022-06-07 DIAGNOSIS — E1140 Type 2 diabetes mellitus with diabetic neuropathy, unspecified: Secondary | ICD-10-CM

## 2022-06-07 DIAGNOSIS — I739 Peripheral vascular disease, unspecified: Secondary | ICD-10-CM

## 2022-06-07 MED ORDER — GABAPENTIN 300 MG PO CAPS: ORAL_CAPSULE | ORAL | 0 refills | Status: DC

## 2022-06-07 NOTE — Telephone Encounter (Signed)
PT stated he need an refill on his Gabap[facility]. Please give patient a call. Thanks

## 2022-06-07 NOTE — Telephone Encounter (Signed)
sent 

## 2022-06-07 NOTE — Telephone Encounter (Signed)
Refill request sent to [facility]

## 2022-06-07 NOTE — Telephone Encounter (Signed)
Patient notified

## 2022-06-09 DIAGNOSIS — I209 Angina pectoris, unspecified: Secondary | ICD-10-CM | POA: Diagnosis not present

## 2022-06-09 DIAGNOSIS — I255 Ischemic cardiomyopathy: Secondary | ICD-10-CM | POA: Diagnosis not present

## 2022-06-09 DIAGNOSIS — Z950 Presence of cardiac pacemaker: Secondary | ICD-10-CM | POA: Diagnosis not present

## 2022-06-15 ENCOUNTER — Ambulatory Visit
Payer: BC Managed Care – PPO | Attending: Student in an Organized Health Care Education/Training Program | Admitting: Student in an Organized Health Care Education/Training Program

## 2022-06-15 ENCOUNTER — Other Ambulatory Visit: Payer: Self-pay

## 2022-06-15 ENCOUNTER — Encounter: Payer: Self-pay | Admitting: Student in an Organized Health Care Education/Training Program

## 2022-06-15 VITALS — BP 122/62 | HR 84 | Temp 97.2°F | Resp 16 | Ht 67.0 in | Wt 189.0 lb

## 2022-06-15 DIAGNOSIS — M792 Neuralgia and neuritis, unspecified: Secondary | ICD-10-CM | POA: Diagnosis not present

## 2022-06-15 DIAGNOSIS — R0609 Other forms of dyspnea: Secondary | ICD-10-CM | POA: Diagnosis not present

## 2022-06-15 DIAGNOSIS — Z950 Presence of cardiac pacemaker: Secondary | ICD-10-CM | POA: Diagnosis not present

## 2022-06-15 DIAGNOSIS — I4901 Ventricular fibrillation: Secondary | ICD-10-CM | POA: Diagnosis not present

## 2022-06-15 DIAGNOSIS — E1140 Type 2 diabetes mellitus with diabetic neuropathy, unspecified: Secondary | ICD-10-CM | POA: Diagnosis not present

## 2022-06-15 DIAGNOSIS — I739 Peripheral vascular disease, unspecified: Secondary | ICD-10-CM | POA: Diagnosis not present

## 2022-06-15 DIAGNOSIS — I10 Essential (primary) hypertension: Secondary | ICD-10-CM | POA: Diagnosis not present

## 2022-06-15 DIAGNOSIS — I25118 Atherosclerotic heart disease of native coronary artery with other forms of angina pectoris: Secondary | ICD-10-CM | POA: Diagnosis not present

## 2022-06-15 DIAGNOSIS — I255 Ischemic cardiomyopathy: Secondary | ICD-10-CM | POA: Diagnosis not present

## 2022-06-15 MED ORDER — CAPSAICIN-CLEANSING GEL 8 % EX KIT
2.0000 | PACK | Freq: Once | CUTANEOUS | Status: AC
  Administered 2022-06-15: 2 via TOPICAL

## 2022-06-15 NOTE — Progress Notes (Signed)
Safety precautions to be maintained throughout the outpatient stay will include: orient to surroundings, keep bed in low position, maintain call bell within reach at all times, provide assistance with transfer out of bed and ambulation.  

## 2022-06-15 NOTE — Patient Instructions (Signed)

## 2022-06-15 NOTE — Progress Notes (Signed)
PROVIDER NOTE: Interpretation of information contained herein should be left to medically-trained personnel. Specific patient instructions are provided elsewhere under "Patient Instructions" section of medical record. This document was created in part using STT-dictation technology, any transcriptional errors that may result from this process are unintentional.  Patient: Damon English Type: Established DOB: Jul 30, 1951 MRN: [id number] PCP: [name], PA-C  Service: Procedure DOS: 06/15/2022 Setting: Ambulatory Location: Ambulatory outpatient facility Delivery: Face-to-face Provider: Gillis Santa, MD Specialty: Interventional Pain Management Specialty designation: 09 Location: Outpatient facility Ref. Prov.: [name], PA-C    Primary Reason for Visit: Interventional Pain Management Treatment. CC: Foot Pain (Bilaterally )    Procedure:          Qutenza treatment for right foot painful diabetic neuropathy #2   1. Chronic painful diabetic neuropathy ([place])   2. Intractable neuropathic pain of lower extremity, right   3. Peripheral arterial disease ([facility])    NAS-11 Pain score:   Pre-procedure: 4 /10   Post-procedure: 4 /10     Pre-op H&P Assessment:  Damon English is a 71 y.o. (year old), male patient, seen today for interventional treatment. 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 Damon English has a current medication list which includes the following prescription(s): acetaminophen, apixaban, atorvastatin, clonazepam, clonazepam, clopidogrel,  diclofenac sodium, duloxetine, famotidine, furosemide, gabapentin, glipizide, guaifenesin, isosorbide mononitrate, jardiance, latanoprost, losartan, metoprolol succinate, nitroglycerin, polyethylene glycol, potassium chloride sa, ranolazine, sennosides, spironolactone, enstilar, ferrous sulfate, and thiamine. His primarily concern today is the Foot Pain (Bilaterally )  Initial Vital Signs:  Pulse/HCG Rate: 84  Temp: (!) 97.2 F (36.2 C) Resp: 16 BP: 122/62 SpO2: 99 %  BMI: Estimated body mass index is 29.6 kg/m as calculated from the following:   Height as of this encounter: '5\' 7"'$  (1.702 m).   Weight as of this encounter: 189 lb (85.7 kg).  Risk Assessment: Allergies: Reviewed. He is allergic to amoxicillin, hydromorphone hcl, and aspirin.  Allergy Precautions: None required Coagulopathies: Reviewed. None identified.  Blood-thinner therapy: None at this time Active Infection(s): Reviewed. None identified. Damon English is afebrile  Site Confirmation: Damon English was asked to confirm the procedure and laterality before marking the site Procedure checklist: Completed Consent: Before the procedure and under the influence of no sedative(s), amnesic(s), or anxiolytics, the patient was informed of the treatment options, risks and possible complications. To fulfill our ethical and legal obligations, as recommended by the American Medical Association's Code of Ethics, I have informed the patient of my clinical impression; the nature and purpose of the treatment or procedure; the risks, benefits, and possible complications of the intervention; the alternatives, including doing nothing; the risk(s) and benefit(s) of the alternative treatment(s) or procedure(s); and the risk(s) and benefit(s) of doing nothing. The patient was provided information about the general risks and possible complications associated with the procedure. These may include, but are not limited to: failure to achieve desired  goals, infection, bleeding, organ or nerve damage, allergic reactions, paralysis, and death. In addition, the patient was informed of those risks and complications associated to the procedure, such as failure to decrease pain; infection; bleeding; organ or nerve damage with subsequent damage to sensory, motor, and/or autonomic systems, resulting in  permanent pain, numbness, and/or weakness of one or several areas of the body; allergic reactions; (i.e.: anaphylactic reaction); and/or death. Furthermore, the patient was informed of those risks and complications associated with the medications. These include, but are not limited to: allergic reactions (i.e.: anaphylactic or anaphylactoid reaction(s)); adrenal axis suppression; blood sugar elevation that in diabetics may result in ketoacidosis or comma; water retention that in patients with history of congestive heart failure may result in shortness of breath, pulmonary edema, and decompensation with resultant heart failure; weight gain; swelling or edema; medication-induced neural toxicity; particulate matter embolism and blood vessel occlusion with resultant organ, and/or nervous system infarction; and/or aseptic necrosis of one or more joints. Finally, the patient was informed that Medicine is not an exact science; therefore, there is also the possibility of unforeseen or unpredictable risks and/or possible complications that may result in a catastrophic outcome. The patient indicated having understood very clearly. We have given the patient no guarantees and we have made no promises. Enough time was given to the patient to ask questions, all of which were answered to the patient's satisfaction. Damon English has indicated that he wanted to continue with the procedure. Attestation: I, the ordering provider, attest that I have discussed with the patient the benefits, risks, side-effects, alternatives, likelihood of achieving goals, and potential problems during  recovery for the procedure that I have provided informed consent. Date  Time: 06/15/2022 10:24 AM  Pre-Procedure Preparation:  Monitoring: As per clinic protocol. Respiration, ETCO2, SpO2, BP, heart rate and rhythm monitor placed and checked for adequate function Safety Precautions: Patient was assessed for positional comfort and pressure points before starting the procedure. Time-out: I initiated and conducted the "Time-out" before starting the procedure, as per protocol. The patient was asked to participate by confirming the accuracy of the "Time Out" information. Verification of the correct person, site, and procedure were performed and confirmed by me, the nursing staff, and the patient. "Time-out" conducted as per Joint Commission's Universal Protocol (UP.01.01.01). Time: 1050  Description of Procedure:                              Vitals:   06/15/22 1027  BP: 122/62  Pulse: 84  Resp: 16  Temp: (!) 97.2 F (36.2 C)  TempSrc: Temporal  SpO2: 99%  Weight: 189 lb (85.7 kg)  Height: '5\' 7"'$  (1.702 m)     Start Time: 1050 hrs. End Time: 1135 hrs.  Qutenza patch applied to the plantar and dorsal surface of the right foot.     Post-operative Assessment:  Post-procedure Vital Signs:  Pulse/HCG Rate: 84  Temp:  (!) 97.2 F (36.2 C) Resp: 16 BP:  122/62 SpO2: 99 %  EBL: None  Complications: No immediate post-treatment complications observed by team, or reported by patient.  Note: The patient tolerated the entire procedure well. A repeat set of vitals were taken after the procedure and the patient was kept under observation following institutional policy, for this type of procedure. Post-procedural neurological assessment was performed, showing return to baseline, prior to discharge. The patient was provided with post-procedure discharge instructions, including a section on how to identify potential problems. Should any problems arise concerning this procedure, the patient  was given instructions to immediately contact us, at any time, without hesitation. In any case, we plan to contact the patient by telephone for a follow-up status report regarding this interventional procedure.  Comments:  No additional relevant information.  Plan of Care     Medications ordered for procedure: Meds ordered this encounter  Medications   capsaicin topical system 8 % patch 2 patch   Medications administered: We administered capsaicin topical system.  See the medical record for exact dosing, route, and time of administration.  Follow-up plan:   Return in about 10 weeks (around 08/24/2022) for Post Procedure Evaluation, virtual.       Qutenza 03/09/22, 06/15/22   Recent Visits Date Type Provider Dept  04/26/22 Office Visit Gillis Santa, MD [facility]  Showing recent visits within past 90 days and meeting all other requirements Today's Visits Date Type Provider Dept  06/15/22 Procedure visit Gillis Santa, MD [facility]  Showing today's visits and meeting all other requirements Future Appointments Date Type Provider Dept  08/24/22 Appointment Gillis Santa, MD [facility]  Showing future appointments within next 90 days and meeting all other requirements  Disposition: Discharge home  Discharge (Date  Time): 06/15/2022; 1137 hrs.   Primary Care Physician: [name], PA-C Location: [facility] Note by: Gillis Santa, MD Date: 06/15/2022; Time: 1:57 PM  Disclaimer:  Medicine is not an exact science. The only guarantee in medicine is that nothing is guaranteed. It is important to note that the decision to proceed with this intervention was based on the information collected from the patient. The Data and conclusions were drawn from the patient's questionnaire, the interview, and the physical examination. Because the information was provided in large part by the patient, it cannot be guaranteed that it has  not been purposely or unconsciously manipulated. Every effort has been made to obtain as much relevant data as possible for this evaluation. It is important to note that the conclusions that lead to this procedure are derived in large part from the available data. Always take into account that the treatment will also be dependent on availability of resources and existing treatment guidelines, considered by other Pain Management Practitioners as being common knowledge and practice, at the time of the intervention. For Medico-Legal purposes, it is also important to point out that variation in procedural techniques and pharmacological choices are the acceptable norm. The indications, contraindications, technique, and results of the above procedure should only be interpreted and judged by a Board-Certified Interventional Pain Specialist with extensive familiarity and expertise in the same exact procedure and technique.

## 2022-06-16 ENCOUNTER — Telehealth: Payer: Self-pay

## 2022-06-16 NOTE — Telephone Encounter (Signed)
Pt was called and his wife answered the phone. She stated that he was having some numbness and tingling in his right foot. She was informed that was a normal side effect and give [place] a call if it don't get better.

## 2022-06-21 ENCOUNTER — Other Ambulatory Visit: Payer: Self-pay | Admitting: Student in an Organized Health Care Education/Training Program

## 2022-06-21 DIAGNOSIS — M792 Neuralgia and neuritis, unspecified: Secondary | ICD-10-CM

## 2022-06-21 DIAGNOSIS — I739 Peripheral vascular disease, unspecified: Secondary | ICD-10-CM

## 2022-06-21 DIAGNOSIS — G894 Chronic pain syndrome: Secondary | ICD-10-CM

## 2022-06-21 DIAGNOSIS — E1140 Type 2 diabetes mellitus with diabetic neuropathy, unspecified: Secondary | ICD-10-CM

## 2022-07-07 DIAGNOSIS — I252 Old myocardial infarction: Secondary | ICD-10-CM | POA: Diagnosis not present

## 2022-07-07 DIAGNOSIS — R0609 Other forms of dyspnea: Secondary | ICD-10-CM | POA: Diagnosis not present

## 2022-07-07 DIAGNOSIS — I25118 Atherosclerotic heart disease of native coronary artery with other forms of angina pectoris: Secondary | ICD-10-CM | POA: Diagnosis not present

## 2022-07-07 DIAGNOSIS — I5032 Chronic diastolic (congestive) heart failure: Secondary | ICD-10-CM | POA: Diagnosis not present

## 2022-07-07 DIAGNOSIS — I25718 Atherosclerosis of autologous vein coronary artery bypass graft(s) with other forms of angina pectoris: Secondary | ICD-10-CM | POA: Diagnosis not present

## 2022-07-07 DIAGNOSIS — I255 Ischemic cardiomyopathy: Secondary | ICD-10-CM | POA: Diagnosis not present

## 2022-07-07 DIAGNOSIS — I5042 Chronic combined systolic (congestive) and diastolic (congestive) heart failure: Secondary | ICD-10-CM | POA: Diagnosis not present

## 2022-07-18 DIAGNOSIS — R0789 Other chest pain: Secondary | ICD-10-CM | POA: Diagnosis not present

## 2022-07-18 DIAGNOSIS — Z23 Encounter for immunization: Secondary | ICD-10-CM | POA: Diagnosis not present

## 2022-07-18 DIAGNOSIS — Z79899 Other long term (current) drug therapy: Secondary | ICD-10-CM | POA: Diagnosis not present

## 2022-07-18 DIAGNOSIS — K219 Gastro-esophageal reflux disease without esophagitis: Secondary | ICD-10-CM | POA: Diagnosis not present

## 2022-07-18 DIAGNOSIS — Z125 Encounter for screening for malignant neoplasm of prostate: Secondary | ICD-10-CM | POA: Diagnosis not present

## 2022-07-18 DIAGNOSIS — I739 Peripheral vascular disease, unspecified: Secondary | ICD-10-CM | POA: Diagnosis not present

## 2022-07-18 DIAGNOSIS — E119 Type 2 diabetes mellitus without complications: Secondary | ICD-10-CM | POA: Diagnosis not present

## 2022-08-04 DIAGNOSIS — E782 Mixed hyperlipidemia: Secondary | ICD-10-CM | POA: Diagnosis not present

## 2022-08-04 DIAGNOSIS — I10 Essential (primary) hypertension: Secondary | ICD-10-CM | POA: Diagnosis not present

## 2022-08-04 DIAGNOSIS — Z86711 Personal history of pulmonary embolism: Secondary | ICD-10-CM | POA: Diagnosis not present

## 2022-08-04 DIAGNOSIS — E876 Hypokalemia: Secondary | ICD-10-CM | POA: Diagnosis not present

## 2022-08-04 DIAGNOSIS — Z951 Presence of aortocoronary bypass graft: Secondary | ICD-10-CM | POA: Diagnosis not present

## 2022-08-04 DIAGNOSIS — I4901 Ventricular fibrillation: Secondary | ICD-10-CM | POA: Diagnosis not present

## 2022-08-04 DIAGNOSIS — I469 Cardiac arrest, cause unspecified: Secondary | ICD-10-CM | POA: Diagnosis not present

## 2022-08-04 DIAGNOSIS — Z79899 Other long term (current) drug therapy: Secondary | ICD-10-CM | POA: Diagnosis not present

## 2022-08-04 DIAGNOSIS — I25118 Atherosclerotic heart disease of native coronary artery with other forms of angina pectoris: Secondary | ICD-10-CM | POA: Diagnosis not present

## 2022-08-04 DIAGNOSIS — I255 Ischemic cardiomyopathy: Secondary | ICD-10-CM | POA: Diagnosis not present

## 2022-08-04 DIAGNOSIS — Z8774 Personal history of (corrected) congenital malformations of heart and circulatory system: Secondary | ICD-10-CM | POA: Diagnosis not present

## 2022-08-04 DIAGNOSIS — Z950 Presence of cardiac pacemaker: Secondary | ICD-10-CM | POA: Diagnosis not present

## 2022-08-04 DIAGNOSIS — Z87891 Personal history of nicotine dependence: Secondary | ICD-10-CM | POA: Diagnosis not present

## 2022-08-04 DIAGNOSIS — I739 Peripheral vascular disease, unspecified: Secondary | ICD-10-CM | POA: Diagnosis not present

## 2022-08-05 ENCOUNTER — Telehealth: Payer: Self-pay | Admitting: Student in an Organized Health Care Education/Training Program

## 2022-08-05 ENCOUNTER — Other Ambulatory Visit: Payer: Self-pay

## 2022-08-05 DIAGNOSIS — M792 Neuralgia and neuritis, unspecified: Secondary | ICD-10-CM

## 2022-08-05 DIAGNOSIS — G894 Chronic pain syndrome: Secondary | ICD-10-CM

## 2022-08-05 DIAGNOSIS — I739 Peripheral vascular disease, unspecified: Secondary | ICD-10-CM

## 2022-08-05 DIAGNOSIS — E1140 Type 2 diabetes mellitus with diabetic neuropathy, unspecified: Secondary | ICD-10-CM

## 2022-08-05 NOTE — Telephone Encounter (Signed)
[facility] is getting [facility] from pharmacy that they haven't heard anything from doctor to refilled patient Gabapentin. PT stated that he will be out on Tuesday. Please give pharmacy and patient a call. Thanks

## 2022-08-05 NOTE — Telephone Encounter (Signed)
Refill request sent to Dr Lateef.  

## 2022-08-08 MED ORDER — GABAPENTIN 300 MG PO CAPS: ORAL_CAPSULE | ORAL | 0 refills | Status: DC

## 2022-08-15 ENCOUNTER — Encounter (INDEPENDENT_AMBULATORY_CARE_PROVIDER_SITE_OTHER): Payer: Self-pay

## 2022-08-19 ENCOUNTER — Ambulatory Visit (INDEPENDENT_AMBULATORY_CARE_PROVIDER_SITE_OTHER): Payer: Medicare Other | Admitting: Nurse Practitioner

## 2022-08-19 ENCOUNTER — Encounter (INDEPENDENT_AMBULATORY_CARE_PROVIDER_SITE_OTHER): Payer: BC Managed Care – PPO

## 2022-08-19 DIAGNOSIS — N289 Disorder of kidney and ureter, unspecified: Secondary | ICD-10-CM | POA: Diagnosis not present

## 2022-08-24 ENCOUNTER — Encounter: Payer: Self-pay | Admitting: Student in an Organized Health Care Education/Training Program

## 2022-08-24 ENCOUNTER — Ambulatory Visit
Payer: BC Managed Care – PPO | Attending: Student in an Organized Health Care Education/Training Program | Admitting: Student in an Organized Health Care Education/Training Program

## 2022-08-24 DIAGNOSIS — M792 Neuralgia and neuritis, unspecified: Secondary | ICD-10-CM

## 2022-08-24 DIAGNOSIS — E1140 Type 2 diabetes mellitus with diabetic neuropathy, unspecified: Secondary | ICD-10-CM | POA: Diagnosis not present

## 2022-08-24 DIAGNOSIS — G894 Chronic pain syndrome: Secondary | ICD-10-CM | POA: Diagnosis not present

## 2022-08-24 DIAGNOSIS — I739 Peripheral vascular disease, unspecified: Secondary | ICD-10-CM | POA: Diagnosis not present

## 2022-08-24 MED ORDER — GABAPENTIN 100 MG PO CAPS: 100.0000 mg | ORAL_CAPSULE | Freq: Two times a day (BID) | ORAL | 0 refills | Status: DC

## 2022-08-24 NOTE — Progress Notes (Signed)
Patient: Damon English  Service Category: E/M  Provider: Gillis Santa, MD  DOB: May 08, 1951  DOS: 08/24/2022  Location: Office  MRN: [id number]  Setting: Ambulatory outpatient  Referring Provider: [name], PA-C  Type: Established Patient  Specialty: Interventional Pain Management  PCP: [name], PA-C  Location: Remote location  Delivery: TeleHealth     Virtual Encounter - Pain Management PROVIDER NOTE: Information contained herein reflects review and annotations entered in association with encounter. Interpretation of such information and data should be left to medically-trained personnel. Information provided to patient can be located elsewhere in the medical record under "Patient Instructions". Document created using STT-dictation technology, any transcriptional errors that may result from process are unintentional.    Contact & Pharmacy Preferred: [phone] Home: [phone] (home) Mobile: [phone] (mobile) E-mail: [email or web address]  [id number] [place] [address]Phone: [phone]: [phone]  Pre-screening  Mr. BHiresoffered "in-person" vs "virtual" encounter. He indicated preferring virtual for this encounter.   Reason COVID-19*  Social distancing based on CDC and AMA recommendations.   I contacted GCLELL TRAHANon 08/24/2022 via telephone.      I clearly identified myself as BGillis Santa MD. I verified that I was speaking with the correct person using two identifiers (Name: Damon English and date of birth: 5May 08, 1952.  Consent I sought verbal advanced consent from GEarney Hamburgfor virtual visit interactions. I informed Mr. BChristianoof possible security and privacy concerns, risks, and limitations associated with providing "not-in-person" medical evaluation and management services. I also informed Mr. BSherrerof the availability of "in-person" appointments. Finally,  I informed him that there would be a charge for the virtual visit and that he could be  personally, fully or partially, financially responsible for it. Mr. BSchirmerexpressed understanding and agreed to proceed.   Historic Elements   Mr. Damon OMLORis a 70y.o. year old, male patient evaluated today after our last contact on 08/05/2022. Mr. [name] has a past medical history of Alcohol use, Diabetes mellitus without complication ([place], History of tobacco use, Hypertension, Pulmonary embolism ([place] (2012), and PVD (peripheral vascular disease) ([place]. He also  has a past surgical history that includes Vascular surgery; Cholecystectomy; Back surgery; Femoral artery - popliteal artery bypass graft (Right, 06/19/2018); ERCP w/ sphicterotomy (2015); Thromboendarterectomy (Right, 06/19/2018); Tonsillectomy; CORONARY ANGIOGRAPHY (N/A, 01/30/2019); Lower Extremity Angiography (Left, 01/14/2021); LEFT HEART CATH AND CORS/GRAFTS ANGIOGRAPHY (N/A, 06/25/2021); TEMPORARY PACEMAKER (N/A, 07/11/2021); and Lower Extremity Angiography (Left, 11/04/2021). Mr. BSitzmanhas a current medication list which includes the following prescription(s): acetaminophen, apixaban, atorvastatin, clonazepam, clonazepam, clopidogrel, diclofenac sodium, duloxetine, famotidine, furosemide, gabapentin, glipizide, guaifenesin, isosorbide mononitrate, jardiance, latanoprost, losartan, nitroglycerin, polyethylene glycol, ranolazine, sennosides, and thiamine. He  reports that he quit smoking about 11 years ago. His smoking use included cigarettes. He has a 80.00 pack-year smoking history. He has never used smokeless tobacco. He reports current alcohol use of about 7.0 standard drinks of alcohol per week. He reports that he does not use drugs. Mr. BWakefieldis allergic to amoxicillin, hydromorphone hcl, and aspirin.   HPI  Today, he is being contacted for a post-procedure assessment.   Post-procedure evaluation    Procedure:          Qutenza  treatment for right foot painful diabetic neuropathy #2   1. Chronic painful diabetic neuropathy ([place]   2. Intractable neuropathic pain of lower extremity, right   3. Peripheral  arterial disease ([place])    NAS-11 Pain score:   Pre-procedure: 4 /10   Post-procedure: 4 /10      Effectiveness:  Initial hour after procedure: 100 %  Subsequent 4-6 hours post-procedure: 100 %  Analgesia past initial 6 hours: 50 % (lasted a month)  Ongoing improvement:  Analgesic:  30% Function: Somewhat improved  Laboratory Chemistry Profile   Renal Lab Results  Component Value Date   BUN 30 (H) 11/04/2021   CREATININE 1.53 (H) 11/04/2021   GFRAA >60 01/30/2019   GFRNONAA 49 (L) 11/04/2021    Hepatic Lab Results  Component Value Date   AST 28 07/11/2021   ALT 26 07/11/2021   ALBUMIN 3.8 07/11/2021   ALKPHOS 121 07/11/2021   LIPASE 31 01/28/2019    Electrolytes Lab Results  Component Value Date   NA 135 07/11/2021   K 4.2 07/11/2021   CL 100 07/11/2021   CALCIUM 9.5 07/11/2021   MG 1.9 07/11/2021   PHOS 4.2 06/25/2021    Bone No results found for: "VD25OH", "VD125OH2TOT", "JF3545GY5", "WL8937DS2", "25OHVITD1", "25OHVITD2", "25OHVITD3", "TESTOFREE", "TESTOSTERONE"  Inflammation (CRP: Acute Phase) (ESR: Chronic Phase) Lab Results  Component Value Date   LATICACIDVEN 1.3 06/19/2021         Note: Above Lab results reviewed.  Imaging  VAS [place] LOWER EXTREMITY ARTERIAL DUPLEX LOWER EXTREMITY ARTERIAL DUPLEX STUDY  Patient Name:  Damon English  Date of Exam:   02/15/2022 Medical Rec #: [id number]         Accession #:    [id number] Date of Birth: 08/06/1951          Patient Gender: M Patient Age:   45 years Exam Location:  [facility] Procedure:      VAS [place] LOWER EXTREMITY ARTERIAL DUPLEX Referring Phys: Eulogio Ditch  --------------------------------------------------------------------------------   Indications: Peripheral artery disease.   Vascular Interventions:  01/14/21: Bilateral CIA & right EIA stents;                         11/04/21: Left SFA-popliteal stent;. Current ABI:            Right=1.0 & Left=1.01  Performing Technologist: Blondell Reveal RT, RDMS, RVT    Examination Guidelines: A complete evaluation includes B-mode imaging, spectral Doppler, color Doppler, and power Doppler as needed of all accessible portions of each vessel. Bilateral testing is considered an integral part of a complete examination. Limited examinations for reoccurring indications may be performed as noted.       +--------------+-------+-----------+--------+--------+-----+--------+ Left PoplitealAP (cm)Transv (cm)WaveformStenosisShapeComments +--------------+-------+-----------+--------+--------+-----+--------+ Distal                                                        +--------------+-------+-----------+--------+--------+-----+--------+    +-----------+--------+-----+--------+--------+--------+ LEFT       PSV cm/sRatioStenosisWaveformComments +-----------+--------+-----+--------+--------+--------+ CFA Mid    74                   biphasic         +-----------+--------+-----+--------+--------+--------+ DFA        325                  biphasic         +-----------+--------+-----+--------+--------+--------+ SFA Prox   64  biphasic         +-----------+--------+-----+--------+--------+--------+ SFA Mid    62                   biphasic         +-----------+--------+-----+--------+--------+--------+ SFA Distal 96                   biphasic         +-----------+--------+-----+--------+--------+--------+ POP Prox   112                  biphasic         +-----------+--------+-----+--------+--------+--------+ POP Distal 70                   biphasic         +-----------+--------+-----+--------+--------+--------+ ATA Distal 29                   biphasic          +-----------+--------+-----+--------+--------+--------+ PTA Distal 54                   biphasic         +-----------+--------+-----+--------+--------+--------+ PERO Distal55                   biphasic         +-----------+--------+-----+--------+--------+--------+    Summary: Left: Patent left lower extremity arterial system. Elevated velocity at the profunda femoral origin suggestive of >50% stenosis.    See table(s) above for measurements and observations.  Electronically signed by [name] MD on 02/17/2022 at 3:10:22 PM.       Final   VAS [place] ABI WITH/WO TBI  LOWER EXTREMITY DOPPLER STUDY  Patient Name:  VASHAUN OSMON  Date of Exam:   02/15/2022 Medical Rec #: [id number]         Accession #:    [id number] Date of Birth: [birth year]          Patient Gender: M Patient Age:   27 years Exam Location:  [facility] Procedure:      VAS [place] ABI WITH/WO TBI Referring Phys: Eulogio Ditch  --------------------------------------------------------------------------------   Indications: Peripheral artery disease.   Vascular Interventions: 01/14/21: Bilateral CIA & right EIA stents;                         11/04/21: Left SFA-popliteal stent;.  Performing Technologist: Blondell Reveal RT, RDMS, RVT    Examination Guidelines: A complete evaluation includes at minimum, Doppler waveform signals and systolic blood pressure reading at the level of bilateral brachial, anterior tibial, and posterior tibial arteries, when vessel segments are accessible. Bilateral testing is considered an integral part of a complete examination. Photoelectric Plethysmograph (PPG) waveforms and toe systolic pressure readings are included as required and additional duplex testing as needed. Limited examinations for reoccurring indications may be performed as noted.    ABI Findings: +---------+------------------+-----+--------+--------+ Right    Rt Pressure  (mmHg)IndexWaveformComment  +---------+------------------+-----+--------+--------+ Brachial 148                                     +---------+------------------+-----+--------+--------+ ATA      145               0.98 biphasic         +---------+------------------+-----+--------+--------+ PTA      148  1.00 biphasic         +---------+------------------+-----+--------+--------+ Great Toe104               0.70 Normal           +---------+------------------+-----+--------+--------+  +---------+------------------+-----+--------+-------+ Left     Lt Pressure (mmHg)IndexWaveformComment +---------+------------------+-----+--------+-------+ Brachial 140                                    +---------+------------------+-----+--------+-------+ ATA      139               0.94 biphasic        +---------+------------------+-----+--------+-------+ PTA      150               1.01 biphasic        +---------+------------------+-----+--------+-------+ Great Toe73                0.49 Normal          +---------+------------------+-----+--------+-------+  +-------+-----------+-----------+------------+------------+ ABI/TBIToday's ABIToday's TBIPrevious ABIPrevious TBI +-------+-----------+-----------+------------+------------+ Right  1.0        0.70       1.17        0.57         +-------+-----------+-----------+------------+------------+ Left   1.01       0.49       1.16        0.83         +-------+-----------+-----------+------------+------------+  ADDITIONAL NOTE: Normal biphasic Doppler and normal PPG waveforms noted in the bilateral upper extremities.   Bilateral ABIs appear essentially unchanged compared to prior study on 11/18/21.   Summary: Right: Resting right ankle-brachial index is within normal range. No evidence of significant right lower extremity arterial disease. The right toe-brachial index is  normal.  Left: Resting left ankle-brachial index is within normal range. No evidence of significant left lower extremity arterial disease. The left toe-brachial index is abnormal.  *See table(s) above for measurements and observations.    Electronically signed by [name] MD on 02/17/2022 at 3:10:08 PM.      Final    Assessment  The primary encounter diagnosis was Chronic painful diabetic neuropathy ([place]). Diagnoses of Intractable neuropathic pain of lower extremity, right, Peripheral arterial disease ([place]), and Chronic pain syndrome were also pertinent to this visit.  Plan of Care   Mr. REINALDO HELT has a current medication list which includes the following long-term medication(s): clonazepam, clonazepam, duloxetine, famotidine, gabapentin, and nitroglycerin.   1. Chronic painful diabetic neuropathy (HCC) - gabapentin (NEURONTIN) 100 MG capsule; Take 1-2 capsules (100-200 mg total) by mouth 2 (two) times daily. Follow written titration schedule  Dispense: 120 capsule; Refill: 0 - NEUROLYSIS; Future  2. Intractable neuropathic pain of lower extremity, right - gabapentin (NEURONTIN) 100 MG capsule; Take 1-2 capsules (100-200 mg total) by mouth 2 (two) times daily. Follow written titration schedule  Dispense: 120 capsule; Refill: 0  3. Peripheral arterial disease ([place])  4. Chronic pain syndrome - gabapentin (NEURONTIN) 100 MG capsule; Take 1-2 capsules (100-200 mg total) by mouth 2 (two) times daily. Follow written titration schedule  Dispense: 120 capsule; Refill: 0 - NEUROLYSIS; Future  Patient noting side effects with gabapentin at higher dose.  States that it is resulting in increased appetite and weight gain.  Discussed dose reduction to 100 mg twice daily if he is tolerating it okay to increase it to 200 mg twice daily.  Patient endorsed  understanding.   Pharmacotherapy (Medications Ordered): Meds ordered this encounter  Medications   gabapentin (NEURONTIN) 100 MG  capsule    Sig: Take 1-2 capsules (100-200 mg total) by mouth 2 (two) times daily. Follow written titration schedule    Dispense:  120 capsule    Refill:  0    Fill one day early if pharmacy is closed on scheduled refill date. May substitute for generic if available.   Orders:  Orders Placed This Encounter  Procedures   NEUROLYSIS    Please order Qutenza patches from pharmacy    Standing Status:   Future    Standing Expiration Date:   11/24/2022    Order Specific Question:   Where will this procedure be performed?    Answer:   [facility]   Follow-up plan:   Return in about 6 weeks (around 10/05/2022) for [place].     Qutenza 03/09/22, 06/15/22    Recent Visits Date Type Provider Dept  06/15/22 Procedure visit Gillis Santa, MD [facility]  Showing recent visits within past 90 days and meeting all other requirements Today's Visits Date Type Provider Dept  08/24/22 Office Visit Gillis Santa, MD [facility]  Showing today's visits and meeting all other requirements Future Appointments No visits were found meeting these conditions. Showing future appointments within next 90 days and meeting all other requirements  I discussed the assessment and treatment plan with the patient. The patient was provided an opportunity to ask questions and all were answered. The patient agreed with the plan and demonstrated an understanding of the instructions.  Patient advised to call back or seek an in-person evaluation if the symptoms or condition worsens.  Duration of encounter:82mnutes.  Note by: BGillis Santa MD Date: 08/24/2022; Time: 3:32 PM

## 2022-08-25 ENCOUNTER — Other Ambulatory Visit: Payer: Self-pay | Admitting: Internal Medicine

## 2022-08-25 DIAGNOSIS — Z86711 Personal history of pulmonary embolism: Secondary | ICD-10-CM | POA: Diagnosis not present

## 2022-08-25 DIAGNOSIS — R0789 Other chest pain: Secondary | ICD-10-CM | POA: Diagnosis not present

## 2022-08-25 DIAGNOSIS — Z950 Presence of cardiac pacemaker: Secondary | ICD-10-CM | POA: Diagnosis not present

## 2022-08-25 DIAGNOSIS — I739 Peripheral vascular disease, unspecified: Secondary | ICD-10-CM | POA: Diagnosis not present

## 2022-08-25 NOTE — Progress Notes (Signed)
Discharge instructions reviewed-kh

## 2022-08-31 ENCOUNTER — Ambulatory Visit
Admission: RE | Admit: 2022-08-31 | Discharge: 2022-08-31 | Disposition: A | Discharge status: Home / Self Care | Payer: BC Managed Care – PPO | Source: Ambulatory Visit | Attending: Internal Medicine | Admitting: Internal Medicine

## 2022-08-31 ENCOUNTER — Ambulatory Visit: Payer: BC Managed Care – PPO

## 2022-08-31 DIAGNOSIS — R0789 Other chest pain: Secondary | ICD-10-CM | POA: Diagnosis not present

## 2022-08-31 DIAGNOSIS — R079 Chest pain, unspecified: Secondary | ICD-10-CM | POA: Diagnosis not present

## 2022-09-03 ENCOUNTER — Other Ambulatory Visit: Payer: Self-pay | Admitting: Student in an Organized Health Care Education/Training Program

## 2022-09-03 DIAGNOSIS — G894 Chronic pain syndrome: Secondary | ICD-10-CM

## 2022-09-03 DIAGNOSIS — E1140 Type 2 diabetes mellitus with diabetic neuropathy, unspecified: Secondary | ICD-10-CM

## 2022-09-03 DIAGNOSIS — M792 Neuralgia and neuritis, unspecified: Secondary | ICD-10-CM

## 2022-09-03 DIAGNOSIS — I739 Peripheral vascular disease, unspecified: Secondary | ICD-10-CM

## 2022-10-05 ENCOUNTER — Ambulatory Visit: Payer: BC Managed Care – PPO | Admitting: Student in an Organized Health Care Education/Training Program

## 2022-10-19 DIAGNOSIS — E782 Mixed hyperlipidemia: Secondary | ICD-10-CM | POA: Diagnosis not present

## 2022-10-19 DIAGNOSIS — I255 Ischemic cardiomyopathy: Secondary | ICD-10-CM | POA: Diagnosis not present

## 2022-10-19 DIAGNOSIS — I25118 Atherosclerotic heart disease of native coronary artery with other forms of angina pectoris: Secondary | ICD-10-CM | POA: Diagnosis not present

## 2022-10-19 DIAGNOSIS — I10 Essential (primary) hypertension: Secondary | ICD-10-CM | POA: Diagnosis not present

## 2022-10-19 DIAGNOSIS — Z950 Presence of cardiac pacemaker: Secondary | ICD-10-CM | POA: Diagnosis not present

## 2022-10-21 ENCOUNTER — Telehealth: Payer: Self-pay | Admitting: Student in an Organized Health Care Education/Training Program

## 2022-10-21 ENCOUNTER — Other Ambulatory Visit: Payer: Self-pay

## 2022-10-21 DIAGNOSIS — M792 Neuralgia and neuritis, unspecified: Secondary | ICD-10-CM

## 2022-10-21 DIAGNOSIS — E1140 Type 2 diabetes mellitus with diabetic neuropathy, unspecified: Secondary | ICD-10-CM

## 2022-10-21 DIAGNOSIS — G894 Chronic pain syndrome: Secondary | ICD-10-CM

## 2022-10-21 NOTE — Telephone Encounter (Signed)
Patient states he is out of gabapentin and appt isnt until Wed 10-26-22. Wants to know if this can be sent in. Call him at [phone]

## 2022-10-23 DIAGNOSIS — Z950 Presence of cardiac pacemaker: Secondary | ICD-10-CM | POA: Diagnosis not present

## 2022-10-24 MED ORDER — GABAPENTIN 100 MG PO CAPS: 100.0000 mg | ORAL_CAPSULE | Freq: Two times a day (BID) | ORAL | 0 refills | Status: DC

## 2022-10-26 ENCOUNTER — Ambulatory Visit
Payer: BC Managed Care – PPO | Attending: Student in an Organized Health Care Education/Training Program | Admitting: Student in an Organized Health Care Education/Training Program

## 2022-10-26 ENCOUNTER — Encounter: Payer: Self-pay | Admitting: Student in an Organized Health Care Education/Training Program

## 2022-10-26 DIAGNOSIS — E1140 Type 2 diabetes mellitus with diabetic neuropathy, unspecified: Secondary | ICD-10-CM | POA: Insufficient documentation

## 2022-10-26 DIAGNOSIS — G894 Chronic pain syndrome: Secondary | ICD-10-CM | POA: Insufficient documentation

## 2022-10-26 MED ORDER — CAPSAICIN-CLEANSING GEL 8 % EX KIT
2.0000 | PACK | Freq: Once | CUTANEOUS | Status: AC
  Administered 2022-10-26: 2 via TOPICAL

## 2022-10-26 NOTE — Progress Notes (Signed)
PROVIDER NOTE: Interpretation of information contained herein should be left to medically-trained personnel. Specific patient instructions are provided elsewhere under "Patient Instructions" section of medical record. This document was created in part using STT-dictation technology, any transcriptional errors that may result from this process are unintentional.  Patient: Damon English Type: Established DOB: 03/30/1951 MRN: [id number] PCP: [name], PA-C  Service: Procedure DOS: 10/26/2022 Setting: Ambulatory Location: Ambulatory outpatient facility Delivery: Face-to-face Provider: Gillis Santa, MD Specialty: Interventional Pain Management Specialty designation: 09 Location: Outpatient facility Ref. Prov.: Gillis Santa, MD    Primary Reason for Visit: Interventional Pain Management Treatment. CC: Foot Pain (right)    Procedure:          Qutenza treatment for right foot painful diabetic neuropathy #3   1. Chronic painful diabetic neuropathy ([place])   2. Chronic pain syndrome    NAS-11 Pain score:   Pre-procedure: 5 /10   Post-procedure: 2 /10     Pre-op H&P Assessment:  Damon English is a 72 y.o. (year old), male patient, seen today for interventional treatment. 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 Damon English has a current medication list which includes the following prescription(s): acetaminophen, apixaban, atorvastatin, clonazepam, clonazepam, clopidogrel, diclofenac sodium, duloxetine, famotidine, furosemide, gabapentin, glipizide,  guaifenesin, isosorbide mononitrate, jardiance, latanoprost, losartan, nitroglycerin, polyethylene glycol, ranolazine, sennosides, and thiamine. His primarily concern today is the Foot Pain (right)  Initial Vital Signs:  Pulse/HCG Rate: 92  Temp: (!) 96.4 F (35.8 C) Resp: 16 BP: (!) 155/81 SpO2: 99 %  BMI: Estimated body mass index is 31.01 kg/m as calculated from the following:   Height as of this encounter: '5\' 7"'$  (1.702 m).   Weight as of this encounter: 198 lb (89.8 kg).  Risk Assessment: Allergies: Reviewed. He is allergic to amoxicillin, hydromorphone hcl, and aspirin.  Allergy Precautions: None required Coagulopathies: Reviewed. None identified.  Blood-thinner therapy: None at this time Active Infection(s): Reviewed. None identified. Damon English is afebrile  Site Confirmation: Damon English was asked to confirm the procedure and laterality before marking the site Procedure checklist: Completed Consent: Before the procedure and under the influence of no sedative(s), amnesic(s), or anxiolytics, the patient was informed of the treatment options, risks and possible complications. To fulfill our ethical and legal obligations, as recommended by the American Medical Association's Code of Ethics, I have informed the patient of my clinical impression; the nature and purpose of the treatment or procedure; the risks, benefits, and possible complications of the intervention; the alternatives, including doing nothing; the risk(s) and benefit(s) of the alternative treatment(s) or procedure(s); and the risk(s) and benefit(s) of doing nothing. The patient was provided information about the general risks and possible complications associated with the procedure. These may include, but are not limited to: failure to achieve desired goals, infection, bleeding, organ or nerve damage, allergic reactions, paralysis, and death. In addition, the patient was informed of those risks and complications  associated to the procedure, such as failure to decrease pain; infection; bleeding; organ or nerve damage with subsequent damage to sensory, motor, and/or autonomic systems, resulting in permanent pain, numbness, and/or weakness of one or several areas of the body; allergic reactions; (i.e.: anaphylactic reaction); and/or death. Furthermore,  the patient was informed of those risks and complications associated with the medications. These include, but are not limited to: allergic reactions (i.e.: anaphylactic or anaphylactoid reaction(s)); adrenal axis suppression; blood sugar elevation that in diabetics may result in ketoacidosis or comma; water retention that in patients with history of congestive heart failure may result in shortness of breath, pulmonary edema, and decompensation with resultant heart failure; weight gain; swelling or edema; medication-induced neural toxicity; particulate matter embolism and blood vessel occlusion with resultant organ, and/or nervous system infarction; and/or aseptic necrosis of one or more joints. Finally, the patient was informed that Medicine is not an exact science; therefore, there is also the possibility of unforeseen or unpredictable risks and/or possible complications that may result in a catastrophic outcome. The patient indicated having understood very clearly. We have given the patient no guarantees and we have made no promises. Enough time was given to the patient to ask questions, all of which were answered to the patient's satisfaction. Damon English has indicated that he wanted to continue with the procedure. Attestation: I, the ordering provider, attest that I have discussed with the patient the benefits, risks, side-effects, alternatives, likelihood of achieving goals, and potential problems during recovery for the procedure that I have provided informed consent. Date  Time: 10/26/2022 11:04 AM  Pre-Procedure Preparation:  Monitoring: As per clinic protocol.  Respiration, ETCO2, SpO2, BP, heart rate and rhythm monitor placed and checked for adequate function Safety Precautions: Patient was assessed for positional comfort and pressure points before starting the procedure. Time-out: I initiated and conducted the "Time-out" before starting the procedure, as per protocol. The patient was asked to participate by confirming the accuracy of the "Time Out" information. Verification of the correct person, site, and procedure were performed and confirmed by me, the nursing staff, and the patient. "Time-out" conducted as per Joint Commission's Universal Protocol (UP.01.01.01). Time: 1113  Description of Procedure:                              Vitals:   10/26/22 1107  BP: (!) 155/81  Pulse: 92  Resp: 16  Temp: (!) 96.4 F (35.8 C)  SpO2: 99%  Weight: 198 lb (89.8 kg)  Height: '5\' 7"'$  (1.702 m)      Start Time: 1113 hrs. End Time: 1209 hrs.  Qutenza patch applied to the plantar and dorsal surface of the right foot.     Post-operative Assessment:  Post-procedure Vital Signs:  Pulse/HCG Rate: 92  Temp:  (!) 96.4 F (35.8 C) Resp: 16 BP:  (!) 155/81 SpO2: 99 %  EBL: None  Complications: No immediate post-treatment complications observed by team, or reported by patient.  Note: The patient tolerated the entire procedure well. A repeat set of vitals were taken after the procedure and the patient was kept under observation following institutional policy, for this type of procedure. Post-procedural neurological assessment was performed, showing return to baseline, prior to discharge. The patient was provided with post-procedure discharge instructions, including a section on how to identify potential problems. Should any problems arise concerning this procedure, the patient was given instructions to immediately contact us, at any time, without hesitation. In any case, we plan to contact the patient by telephone for a follow-up status report  regarding this interventional procedure.  Comments:  No additional relevant information.  Plan of Care     Medications ordered for procedure: Meds ordered this encounter  Medications   capsaicin  topical system 8 % patch 2 patch   Medications administered: We administered capsaicin topical system.  See the medical record for exact dosing, route, and time of administration.  Follow-up plan:   No follow-ups on file.       Qutenza 03/09/22, 06/15/22, 10/26/22   Recent Visits Date Type Provider Dept  08/24/22 Office Visit Gillis Santa, MD [facility]  Showing recent visits within past 90 days and meeting all other requirements Today's Visits Date Type Provider Dept  10/26/22 Procedure visit Gillis Santa, MD [facility]  Showing today's visits and meeting all other requirements Future Appointments Date Type Provider Dept  11/23/22 Appointment Gillis Santa, MD [facility]  Showing future appointments within next 90 days and meeting all other requirements  Disposition: Discharge home  Discharge (Date  Time): 10/26/2022; 1210 hrs.   Primary Care Physician: [name], PA-C Location: [facility] Outpatient Pain Management Facility Note by: Gillis Santa, MD Date: 10/26/2022; Time: 1:13 PM  Disclaimer:  Medicine is not an exact science. The only guarantee in medicine is that nothing is guaranteed. It is important to note that the decision to proceed with this intervention was based on the information collected from the patient. The Data and conclusions were drawn from the patient's questionnaire, the interview, and the physical examination. Because the information was provided in large part by the patient, it cannot be guaranteed that it has not been purposely or unconsciously manipulated. Every effort has been made to obtain as much relevant data as possible for this evaluation. It is important to note that the conclusions that lead to this procedure are  derived in large part from the available data. Always take into account that the treatment will also be dependent on availability of resources and existing treatment guidelines, considered by other Pain Management Practitioners as being common knowledge and practice, at the time of the intervention. For Medico-Legal purposes, it is also important to point out that variation in procedural techniques and pharmacological choices are the acceptable norm. The indications, contraindications, technique, and results of the above procedure should only be interpreted and judged by a Board-Certified Interventional Pain Specialist with extensive familiarity and expertise in the same exact procedure and technique.

## 2022-10-26 NOTE — Patient Instructions (Signed)
____________________________________________________________________________________________  Post-Procedure Discharge Instructions  Instructions: Apply ice:  Purpose: This will minimize any swelling and discomfort after procedure.  When: Day of procedure, as soon as you get home. How: Fill a plastic sandwich bag with crushed ice. Cover it with a small towel and apply to injection site. How long: (15 min on, 15 min off) Apply for 15 minutes then remove x 15 minutes.  Repeat sequence on day of procedure, until you go to bed. Apply heat:  Purpose: To treat any soreness and discomfort from the procedure. When: Starting the next day after the procedure. How: Apply heat to procedure site starting the day following the procedure. How long: May continue to repeat daily, until discomfort goes away. Food intake: Start with clear liquids (like water) and advance to regular food, as tolerated.  Physical activities: Keep activities to a minimum for the first 8 hours after the procedure. After that, then as tolerated. Driving: If you have received any sedation, be responsible and do not drive. You are not allowed to drive for 24 hours after having sedation. Blood thinner: (Applies only to those taking blood thinners) You may restart your blood thinner 6 hours after your procedure. Insulin: (Applies only to Diabetic patients taking insulin) As soon as you can eat, you may resume your normal dosing schedule. Infection prevention: Keep procedure site clean and dry. Shower daily and clean area with soap and water. Post-procedure Pain Diary: Extremely important that this be done correctly and accurately. Recorded information will be used to determine the next step in treatment. For the purpose of accuracy, follow these rules: Evaluate only the area treated. Do not report or include pain from an untreated area. For the purpose of this evaluation, ignore all other areas of pain, except for the treated  area. After your procedure, avoid taking a long nap and attempting to complete the pain diary after you wake up. Instead, set your alarm clock to go off every hour, on the hour, for the initial 8 hours after the procedure. Document the duration of the numbing medicine, and the relief you are getting from it. Do not go to sleep and attempt to complete it later. It will not be accurate. If you received sedation, it is likely that you were given a medication that may cause amnesia. Because of this, completing the diary at a later time may cause the information to be inaccurate. This information is needed to plan your care. Follow-up appointment: Keep your post-procedure follow-up evaluation appointment after the procedure (usually 2 weeks for most procedures, 6 weeks for radiofrequencies). DO NOT FORGET to bring you pain diary with you.   Expect: (What should I expect to see with my procedure?) From numbing medicine (AKA: Local Anesthetics): Numbness or decrease in pain. You may also experience some weakness, which if present, could last for the duration of the local anesthetic. Onset: Full effect within 15 minutes of injected. Duration: It will depend on the type of local anesthetic used. On the average, 1 to 8 hours.  From steroids (Applies only if steroids were used): Decrease in swelling or inflammation. Once inflammation is improved, relief of the pain will follow. Onset of benefits: Depends on the amount of swelling present. The more swelling, the longer it will take for the benefits to be seen. In some cases, up to 10 days. Duration: Steroids will stay in the system x 2 weeks. Duration of benefits will depend on multiple posibilities including persistent irritating factors. Side-effects: If present, they   may typically last 2 weeks (the duration of the steroids). Frequent: Cramps (if they occur, drink Gatorade and take over-the-counter Magnesium 450-500 mg once to twice a day); water retention with  temporary weight gain; increases in blood sugar; decreased immune system response; increased appetite. Occasional: Facial flushing (red, warm cheeks); mood swings; menstrual changes. Uncommon: Long-term decrease or suppression of natural hormones; bone thinning. (These are more common with higher doses or more frequent use. This is why we prefer that our patients avoid having any injection therapies in other practices.)  Very Rare: Severe mood changes; psychosis; aseptic necrosis. From procedure: Some discomfort is to be expected once the numbing medicine wears off. This should be minimal if ice and heat are applied as instructed.  Call if: (When should I call?) You experience numbness and weakness that gets worse with time, as opposed to wearing off. New onset bowel or bladder incontinence. (Applies only to procedures done in the spine)  Emergency Numbers: Durning business hours (Monday - Thursday, 8:00 AM - 4:00 PM) (Friday, 9:00 AM - 12:00 Noon): [phone] After hours: [phone] NOTE: If you are having a problem and are unable connect with, or to talk to a provider, then go to your nearest urgent care or emergency department. If the problem is serious and urgent, please call 911. ____________________________________________________________________________________________   NO extreme temperature changes for next couple of days.

## 2022-10-26 NOTE — Progress Notes (Signed)
Safety precautions to be maintained throughout the outpatient stay will include: orient to surroundings, keep bed in low position, maintain call bell within reach at all times, provide assistance with transfer out of bed and ambulation.  

## 2022-10-27 ENCOUNTER — Telehealth: Payer: Self-pay

## 2022-10-27 NOTE — Telephone Encounter (Signed)
Post procedure follow up.  LM 

## 2022-11-07 ENCOUNTER — Other Ambulatory Visit (INDEPENDENT_AMBULATORY_CARE_PROVIDER_SITE_OTHER): Payer: Self-pay | Admitting: Nurse Practitioner

## 2022-11-07 DIAGNOSIS — Z9889 Other specified postprocedural states: Secondary | ICD-10-CM

## 2022-11-08 ENCOUNTER — Encounter (INDEPENDENT_AMBULATORY_CARE_PROVIDER_SITE_OTHER): Payer: BC Managed Care – PPO

## 2022-11-08 ENCOUNTER — Ambulatory Visit (INDEPENDENT_AMBULATORY_CARE_PROVIDER_SITE_OTHER): Payer: Medicare Other | Admitting: Vascular Surgery

## 2022-11-10 DIAGNOSIS — J069 Acute upper respiratory infection, unspecified: Secondary | ICD-10-CM | POA: Diagnosis not present

## 2022-11-10 DIAGNOSIS — R6889 Other general symptoms and signs: Secondary | ICD-10-CM | POA: Diagnosis not present

## 2022-11-18 ENCOUNTER — Other Ambulatory Visit: Payer: Self-pay | Admitting: Student in an Organized Health Care Education/Training Program

## 2022-11-18 DIAGNOSIS — E1140 Type 2 diabetes mellitus with diabetic neuropathy, unspecified: Secondary | ICD-10-CM

## 2022-11-18 DIAGNOSIS — G894 Chronic pain syndrome: Secondary | ICD-10-CM

## 2022-11-18 DIAGNOSIS — M792 Neuralgia and neuritis, unspecified: Secondary | ICD-10-CM

## 2022-11-22 ENCOUNTER — Telehealth: Payer: Self-pay

## 2022-11-22 NOTE — Telephone Encounter (Signed)
LM  to call office for pre virtual appointment questions 

## 2022-11-23 ENCOUNTER — Ambulatory Visit
Payer: BC Managed Care – PPO | Attending: Student in an Organized Health Care Education/Training Program | Admitting: Student in an Organized Health Care Education/Training Program

## 2022-11-23 DIAGNOSIS — G894 Chronic pain syndrome: Secondary | ICD-10-CM | POA: Diagnosis not present

## 2022-11-23 DIAGNOSIS — I739 Peripheral vascular disease, unspecified: Secondary | ICD-10-CM

## 2022-11-23 DIAGNOSIS — M792 Neuralgia and neuritis, unspecified: Secondary | ICD-10-CM | POA: Diagnosis not present

## 2022-11-23 DIAGNOSIS — E1140 Type 2 diabetes mellitus with diabetic neuropathy, unspecified: Secondary | ICD-10-CM

## 2022-11-23 MED ORDER — GABAPENTIN 100 MG PO CAPS: 100.0000 mg | ORAL_CAPSULE | Freq: Two times a day (BID) | ORAL | 5 refills | Status: DC

## 2022-11-23 NOTE — Progress Notes (Signed)
Patient: Damon English  Service Category: E/M  Provider: Gillis Santa, MD  DOB: Oct 30, 1950  DOS: 11/23/2022  Location: Office  MRN: [id number]  Setting: Ambulatory outpatient  Referring Provider: [name], PA-C  Type: Established Patient  Specialty: Interventional Pain Management  PCP: [name], PA-C  Location: Remote location  Delivery: TeleHealth     Virtual Encounter - Pain Management PROVIDER NOTE: Information contained herein reflects review and annotations entered in association with encounter. Interpretation of such information and data should be left to medically-trained personnel. Information provided to patient can be located elsewhere in the medical record under "Patient Instructions". Document created using STT-dictation technology, any transcriptional errors that may result from process are unintentional.    Contact & Pharmacy Preferred: [phone] Home: [phone] (home) Mobile: [phone] (mobile) E-mail: [email or web address]  [id number] [place] [address]Phone: [phone]: [phone]  Pre-screening  Damon English "in-person" vs "virtual" encounter. He indicated preferring virtual for this encounter.   Reason COVID-19*  Social distancing based on CDC and AMA recommendations.   I contacted GTHAMAS APPLEYARDon 11/23/2022 via telephone.      I clearly identified myself as BGillis Santa MD. I verified that I was speaking with the correct person using two identifiers (Name: GTOSHIO SLUSHER and date of birth: [birth year].  Consent I sought verbal advanced consent from GEarney Hamburgfor virtual visit interactions. I informed Mr. BGildersleeveof possible security and privacy concerns, risks, and limitations associated with providing "not-in-person" medical evaluation and management services. I also informed Mr. BBuerof the availability of "in-person" appointments. Finally, I  informed him that there would be a charge for the virtual visit and that he could be  personally, fully or partially, financially responsible for it. Mr. BBassoexpressed understanding and agreed to proceed.   Historic Elements   Mr. GTAISHAWN SMALDONEis a 72y.o. year old, male patient evaluated today after our last contact on 11/18/2022. Damon English has a past medical history of Alcohol use, Diabetes mellitus without complication ([place], History of tobacco use, Hypertension, Pulmonary embolism ([place] (2012), and PVD (peripheral vascular disease) ([place]. He also  has a past surgical history that includes Vascular surgery; Cholecystectomy; Back surgery; Femoral artery - popliteal artery bypass graft (Right, 06/19/2018); ERCP w/ sphicterotomy (2015); Thromboendarterectomy (Right, 06/19/2018); Tonsillectomy; CORONARY ANGIOGRAPHY (N/A, 01/30/2019); Lower Extremity Angiography (Left, 01/14/2021); LEFT HEART CATH AND CORS/GRAFTS ANGIOGRAPHY (N/A, 06/25/2021); TEMPORARY PACEMAKER (N/A, 07/11/2021); and Lower Extremity Angiography (Left, 11/04/2021). Damon English a current medication English which includes the following prescription(s): acetaminophen, apixaban, atorvastatin, clonazepam, clonazepam, clopidogrel, diclofenac sodium, duloxetine, famotidine, furosemide, glipizide, guaifenesin, isosorbide mononitrate, jardiance, latanoprost, losartan, nitroglycerin, polyethylene glycol, ranolazine, sennosides, thiamine, and gabapentin. He  reports that he quit smoking about 11 years ago. His smoking use included cigarettes. He has a 80.00 pack-year smoking history. He has never used smokeless tobacco. He reports current alcohol use of about 7.0 standard drinks of alcohol per week. He reports that he does not use drugs. Damon English allergic to amoxicillin, hydromorphone hcl, and aspirin.  Estimated body mass index is 31.01 kg/m as calculated from the following:   Height as of 10/26/22: '5\' 7"'$  (1.702 m).   Weight as of  10/26/22: 198 lb (89.8 kg).  HPI  Today, he is being contacted for both, medication management and a post-procedure assessment.   Post-procedure evaluation    Procedure:  Qutenza treatment for right foot painful diabetic neuropathy #3   1. Chronic painful diabetic neuropathy ([place])   2. Chronic pain syndrome    NAS-11 Pain score:   Pre-procedure: 5 /10   Post-procedure: 2 /10      Effectiveness:  Initial hour after procedure: 40 %  Subsequent 4-6 hours post-procedure: 40 %  Analgesia past initial 6 hours: 40 % (lasting 2-3 weeks)  Ongoing improvement:  Analgesic:  <10%    Laboratory Chemistry Profile   Renal Lab Results  Component Value Date   BUN 30 (H) 11/04/2021   CREATININE 1.53 (H) 11/04/2021   GFRAA >60 01/30/2019   GFRNONAA 49 (L) 11/04/2021    Hepatic Lab Results  Component Value Date   AST 28 07/11/2021   ALT 26 07/11/2021   ALBUMIN 3.8 07/11/2021   ALKPHOS 121 07/11/2021   LIPASE 31 01/28/2019    Electrolytes Lab Results  Component Value Date   NA 135 07/11/2021   K 4.2 07/11/2021   CL 100 07/11/2021   CALCIUM 9.5 07/11/2021   MG 1.9 07/11/2021   PHOS 4.2 06/25/2021    Bone No results found for: "VD25OH", "VD125OH2TOT", "JI9678LF8", "BO1751WC5", "25OHVITD1", "25OHVITD2", "25OHVITD3", "TESTOFREE", "TESTOSTERONE"  Inflammation (CRP: Acute Phase) (ESR: Chronic Phase) Lab Results  Component Value Date   LATICACIDVEN 1.3 06/19/2021         Note: Above Lab results reviewed.  Imaging  DG UGI W DOUBLE CM (HD BA) CLINICAL DATA:  Patient with a medical history significant for CAD, CABG, heart failure, VFib cardiac arrest, and complete heart block status post pacemaker. He has recurrent episodes of chest pain which appear to be situational and sometimes occurs after meals. Cardiac workup has been unrevealing. Patient evaluated by GI who requested upper GI series for further workup.  EXAM: UPPER GI SERIES WITH HIGH DENSITY WITHOUT  KUB  TECHNIQUE: Combined double and single contrast examination was performed using effervescent crystals, high-density barium and thin liquid barium. This exam was performed by Soyla Dryer, NP, and was supervised and interpreted by Dr. Quintella Baton.  FLUOROSCOPY: Radiation Exposure Index (as provided by the fluoroscopic device): 38.50 mGy Kerma  COMPARISON:  None Available.  FINDINGS: Esophagus: Normal appearance.  Esophageal motility: Within normal limits.  Gastroesophageal reflux: None visualized.  Ingested 3m barium tablet: Passed normally  Stomach: Normal appearance. No hiatal hernia.  Gastric emptying: Normal.  Duodenum: Normal appearance.  Other:  None.  IMPRESSION: Normal Upper GI.  Read by: JSoyla Dryer NP  Electronically Signed   By: PValetta MoleM.D.   On: 08/31/2022 10:09  Assessment  The primary encounter diagnosis was Chronic painful diabetic neuropathy ([place]. Diagnoses of Peripheral arterial disease ([place], Chronic pain syndrome, and Intractable neuropathic pain of lower extremity, right were also pertinent to this visit.  Plan of Care    Mr. GKEYSTON ARDOLINOhas a current medication English which includes the following long-term medication(s): clonazepam, clonazepam, duloxetine, famotidine, nitroglycerin, and gabapentin.  Pharmacotherapy (Medications Ordered): Meds ordered this encounter  Medications   gabapentin (NEURONTIN) 100 MG capsule    Sig: Take 1-2 capsules (100-200 mg total) by mouth 2 (two) times daily. Follow written titration schedule    Dispense:  120 capsule    Refill:  5    Fill one day early if pharmacy is closed on scheduled refill date. May substitute for generic if available.   Consider repeating Qutenza after 01/25/23, pt will call to request if he wants to repeat   Follow-up plan:   Return  in about 3 months (around 02/21/2023) for Medication Management & discuss repeat Qutenza.     Qutenza 03/09/22, 06/15/22, 10/26/22     Recent Visits Date Type Provider Dept  10/26/22 Procedure visit Gillis Santa, MD [facility]  Showing recent visits within past 90 days and meeting all other requirements Today's Visits Date Type Provider Dept  11/23/22 Office Visit Gillis Santa, MD [facility]  Showing today's visits and meeting all other requirements Future Appointments No visits were found meeting these conditions. Showing future appointments within next 90 days and meeting all other requirements  I discussed the assessment and treatment plan with the patient. The patient was provided an opportunity to ask questions and all were answered. The patient agreed with the plan and demonstrated an understanding of the instructions.  Patient advised to call back or seek an in-person evaluation if the symptoms or condition worsens.  Duration of encounter: 59mnutes.  Note by: BGillis Santa MD Date: 11/23/2022; Time: 11:46 AM

## 2022-12-01 DIAGNOSIS — Z950 Presence of cardiac pacemaker: Secondary | ICD-10-CM | POA: Diagnosis not present

## 2022-12-01 DIAGNOSIS — Z23 Encounter for immunization: Secondary | ICD-10-CM | POA: Diagnosis not present

## 2022-12-01 DIAGNOSIS — I442 Atrioventricular block, complete: Secondary | ICD-10-CM | POA: Diagnosis not present

## 2022-12-09 ENCOUNTER — Ambulatory Visit (INDEPENDENT_AMBULATORY_CARE_PROVIDER_SITE_OTHER): Payer: BC Managed Care – PPO

## 2022-12-09 ENCOUNTER — Encounter (INDEPENDENT_AMBULATORY_CARE_PROVIDER_SITE_OTHER): Payer: Self-pay | Admitting: Vascular Surgery

## 2022-12-09 ENCOUNTER — Ambulatory Visit (INDEPENDENT_AMBULATORY_CARE_PROVIDER_SITE_OTHER): Payer: BC Managed Care – PPO | Admitting: Vascular Surgery

## 2022-12-09 VITALS — BP 111/63 | HR 87 | Resp 16 | Wt 204.6 lb

## 2022-12-09 DIAGNOSIS — I70222 Atherosclerosis of native arteries of extremities with rest pain, left leg: Secondary | ICD-10-CM

## 2022-12-09 DIAGNOSIS — E1140 Type 2 diabetes mellitus with diabetic neuropathy, unspecified: Secondary | ICD-10-CM

## 2022-12-09 DIAGNOSIS — I739 Peripheral vascular disease, unspecified: Secondary | ICD-10-CM | POA: Diagnosis not present

## 2022-12-09 DIAGNOSIS — E785 Hyperlipidemia, unspecified: Secondary | ICD-10-CM

## 2022-12-09 DIAGNOSIS — Z9889 Other specified postprocedural states: Secondary | ICD-10-CM

## 2022-12-09 DIAGNOSIS — I10 Essential (primary) hypertension: Secondary | ICD-10-CM

## 2022-12-09 DIAGNOSIS — I442 Atrioventricular block, complete: Secondary | ICD-10-CM

## 2022-12-09 NOTE — Progress Notes (Signed)
MRN : [id number]  Damon English is a 72 y.o. (10-04-1951) male who presents with chief complaint of  Chief Complaint  Patient presents with   Follow-up    Ultrasound follow up  .  History of Present Illness: Patient returns today in follow up of his PAD.  He has undergone extensive revascularization to the lower extremities in the past.  His most recent intervention was little over a year ago on the left leg.  He is doing quite well.  He has significant cardiac issues.  At 1 point, he actually had a cardiac arrest in our office but was resuscitated and has recovered normally.  He is not currently having any lifestyle limiting claudication, ischemic rest pain, or ulceration.  His ABIs today are 0.98 on the right and 1.01 on the left with triphasic waveforms and less than 50% stenosis seen on duplex of both lower extremities.  Current Outpatient Medications  Medication Sig Dispense Refill   acetaminophen (TYLENOL) 325 MG tablet Take 2 tablets (650 mg total) by mouth every 6 (six) hours as needed for fever. 20 tablet 0   apixaban (ELIQUIS) 5 MG TABS tablet Take by mouth.     atorvastatin (LIPITOR) 80 MG tablet Take 80 mg by mouth daily.     clonazePAM (KLONOPIN) 0.25 MG disintegrating tablet Take 1-2 tablets (0.25-0.5 mg total) by mouth 3 (three) times daily as needed (anxiety/agitation). 20 tablet 0   clonazePAM (KLONOPIN) 0.5 MG tablet Take 0.5 mg by mouth 2 (two) times daily as needed.     clopidogrel (PLAVIX) 75 MG tablet Take 1 tablet (75 mg total) by mouth daily. 30 tablet 0   diclofenac Sodium (VOLTAREN) 1 % GEL Apply 4 g topically 4 (four) times daily.     DULoxetine (CYMBALTA) 30 MG capsule Take 30 mg by mouth daily.     famotidine (PEPCID) 40 MG tablet Take 1 tablet by mouth every morning.     furosemide (LASIX) 20 MG tablet Take 40 mg by mouth daily as needed (Edema (Or weight gain of >2lbs overnight or 5lbs in one week)).     gabapentin (NEURONTIN) 100 MG capsule Take 1-2  capsules (100-200 mg total) by mouth 2 (two) times daily. Follow written titration schedule 120 capsule 5   glipiZIDE (GLUCOTROL) 10 MG tablet Take 5 mg by mouth as needed.     guaiFENesin (MUCINEX) 600 MG 12 hr tablet Take 1 tablet (600 mg total) by mouth 2 (two) times daily. 30 tablet 0   isosorbide mononitrate (IMDUR) 30 MG 24 hr tablet Take 30 mg by mouth at bedtime.     JARDIANCE 10 MG TABS tablet Take 10 mg by mouth daily.     latanoprost (XALATAN) 0.005 % ophthalmic solution Place 1 drop into both eyes at bedtime. 2.5 mL 12   losartan (COZAAR) 25 MG tablet Take 25 mg by mouth daily.     nitroGLYCERIN (NITROSTAT) 0.4 MG SL tablet Place 1 tablet (0.4 mg total) under the tongue every 5 (five) minutes as needed for chest pain. 20 tablet 12   polyethylene glycol (MIRALAX / GLYCOLAX) 17 g packet Take 17 g by mouth daily as needed for mild constipation or moderate constipation. 14 each 0   ranolazine (RANEXA) 500 MG 12 hr tablet Take 500 mg by mouth 2 (two) times daily.     SENNOSIDES PO Take by mouth.     thiamine 100 MG tablet Take 1 tablet (100 mg total) by mouth daily. 30 tablet  0   No current facility-administered medications for this visit.    Past Medical History:  Diagnosis Date   Alcohol use    "cutting back" but still heavy and daily, multiple shots of bourbon each night   Diabetes mellitus without complication ([place])    History of tobacco use    reportedly quit in 2013   Hypertension    Pulmonary embolism ([place]) 2012   unprovoked   PVD (peripheral vascular disease) ([place])     Past Surgical History:  Procedure Laterality Date   BACK SURGERY     CHOLECYSTECTOMY     CORONARY ANGIOGRAPHY N/A 01/30/2019   Procedure: CORONARY ANGIOGRAPHY;  Surgeon: Corey Skains, MD;  Location: [place] [facility];  Service: Cardiovascular;  Laterality: N/A;   ERCP W/ SPHICTEROTOMY  2015   FEMORAL ARTERY - POPLITEAL ARTERY BYPASS GRAFT Right 06/19/2018   LEFT HEART CATH AND CORS/GRAFTS  ANGIOGRAPHY N/A 06/25/2021   Procedure: LEFT HEART CATH AND CORS/GRAFTS ANGIOGRAPHY;  Surgeon: Andrez Grime, MD;  Location: [facility];  Service: Cardiovascular;  Laterality: N/A;   LOWER EXTREMITY ANGIOGRAPHY Left 01/14/2021   Procedure: LOWER EXTREMITY ANGIOGRAPHY;  Surgeon: Algernon Huxley, MD;  Location: [facility];  Service: Cardiovascular;  Laterality: Left;   LOWER EXTREMITY ANGIOGRAPHY Left 11/04/2021   Procedure: LOWER EXTREMITY ANGIOGRAPHY;  Surgeon: Algernon Huxley, MD;  Location: [facility];  Service: Cardiovascular;  Laterality: Left;   TEMPORARY PACEMAKER N/A 07/11/2021   Procedure: TEMPORARY PACEMAKER;  Surgeon: Wellington Hampshire, MD;  Location: [facility];  Service: Cardiovascular;  Laterality: N/A;   THROMBOENDARTERECTOMY Right 06/19/2018   TONSILLECTOMY     VASCULAR SURGERY       Social History   Tobacco Use   Smoking status: Former    Packs/day: 2.00    Years: 40.00    Total pack years: 80.00    Types: Cigarettes    Quit date: 11/27/2010    Years since quitting: 12.0   Smokeless tobacco: Never   Tobacco comments:    Quit in 2012  Vaping Use   Vaping Use: Never used  Substance Use Topics   Alcohol use: Yes    Alcohol/week: 7.0 standard drinks of alcohol    Types: 7 Shots of liquor per week    Comment: stopped alcohol 06/17/2021   Drug use: Never       Family History  Problem Relation Age of Onset   Hypertension Mother    Parkinson's disease Mother    Heart attack Father    Vascular Disease Father    AAA (abdominal aortic aneurysm) Father    Alzheimer's disease Father      Allergies  Allergen Reactions   Amoxicillin Other (See Comments)    Abdominal cramps Other reaction(s): Other (See Comments) Abdominal cramps Tolerated ancef 7/16   Hydromorphone Hcl     Quick & severe nausea/vomiting  Other reaction(s): Vomiting Quick & severe nausea/vomiting    Aspirin Other (See Comments)    Other reaction(s): Other  (See Comments) 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  Is on NOAC for now.  No ASA with it. Not allergic per patient.  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  Is on NOAC for now.  No ASA with it. Not allergic per patient.   Per pt not an allergy     REVIEW OF SYSTEMS (Negative unless checked)  Constitutional: '[]'$ Weight loss  '[]'$ Fever  '[]'$ Chills Cardiac: '[]'$ Chest pain   '[]'$ Chest pressure   '[x]'$ Palpitations   '[]'$   Shortness of breath when laying flat   '[]'$ Shortness of breath at rest   '[x]'$ Shortness of breath with exertion. Vascular:  '[x]'$ Pain in legs with walking   '[]'$ Pain in legs at rest   '[]'$ Pain in legs when laying flat   '[]'$ Claudication   '[]'$ Pain in feet when walking  '[]'$ Pain in feet at rest  '[]'$ Pain in feet when laying flat   '[]'$ History of DVT   '[]'$ Phlebitis   '[]'$ Swelling in legs   '[]'$ Varicose veins   '[]'$ Non-healing ulcers Pulmonary:   '[]'$ Uses home oxygen   '[]'$ Productive cough   '[]'$ Hemoptysis   '[]'$ Wheeze  '[]'$ COPD   '[]'$ Asthma Neurologic:  '[]'$ Dizziness  '[]'$ Blackouts   '[]'$ Seizures   '[]'$ History of stroke   '[]'$ History of TIA  '[]'$ Aphasia   '[]'$ Temporary blindness   '[]'$ Dysphagia   '[]'$ Weakness or numbness in arms   '[]'$ Weakness or numbness in legs Musculoskeletal:  '[x]'$ Arthritis   '[]'$ Joint swelling   '[x]'$ Joint pain   '[]'$ Low back pain Hematologic:  '[]'$ Easy bruising  '[]'$ Easy bleeding   '[]'$ Hypercoagulable state   '[]'$ Anemic   Gastrointestinal:  '[]'$ Blood in stool   '[]'$ Vomiting blood  '[]'$ Gastroesophageal reflux/heartburn   '[]'$ Abdominal pain Genitourinary:  '[]'$ Chronic kidney disease   '[]'$ Difficult urination  '[]'$ Frequent urination  '[]'$ Burning with urination   '[]'$ Hematuria Skin:  '[]'$ Rashes   '[]'$ Ulcers   '[]'$ Wounds Psychological:  '[]'$ History of anxiety   '[]'$  History of major depression.  Physical Examination  BP 111/63 (BP Location: Right Arm)   Pulse 87   Resp 16   Wt 204 lb 9.6 oz (92.8 kg)   BMI 32.04 kg/m  Gen:  WD/WN, NAD Head: [place]/AT, No temporalis wasting. Ear/Nose/Throat: Hearing grossly intact, nares w/o erythema or drainage Eyes: Conjunctiva clear. Sclera  non-icteric Neck: Supple.  Trachea midline Pulmonary:  Good air movement, no use of accessory muscles.  Cardiac: RRR, no JVD Vascular:  Vessel Right Left  Radial Palpable Palpable                          PT Palpable Palpable  DP Palpable Palpable   Gastrointestinal: soft, non-tender/non-distended. No guarding/reflex.  Musculoskeletal: M/S 5/5 throughout.  No deformity or atrophy. Trace LE edema. Neurologic: Sensation grossly intact in extremities.  Symmetrical.  Speech is fluent.  Psychiatric: Judgment intact, Mood & affect appropriate for pt's clinical situation. Dermatologic: No rashes or ulcers noted.  No cellulitis or open wounds.      Labs No results found for this or any previous visit (from the past 2160 hour(s)).  Radiology No results found.  Assessment/Plan  Atherosclerosis of native arteries of extremity with rest pain ([place]) His ABIs today are 0.98 on the right and 1.01 on the left with triphasic waveforms and less than 50% stenosis seen on duplex of both lower extremities.  Perfusion is doing well and he is currently not having any significant symptoms.  Continue current medical regimen.  Recheck in 6 months.  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 Continue statin therapy   Type 2 diabetes mellitus (HCC) blood glucose control important in reducing the progression of atherosclerotic disease. Also, involved in wound healing. On appropriate medications.   HTN (hypertension) blood pressure control important in reducing the progression of atherosclerotic disease. On appropriate oral medications.    [name], MD  12/09/2022 12:11 PM    This note was created with Dragon medical transcription system.  Any errors from dictation are purely unintentional

## 2022-12-09 NOTE — Assessment & Plan Note (Signed)
lipid control important in reducing the progression of atherosclerotic disease. Continue statin therapy  

## 2022-12-09 NOTE — Assessment & Plan Note (Signed)
His ABIs today are 0.98 on the right and 1.01 on the left with triphasic waveforms and less than 50% stenosis seen on duplex of both lower extremities.  Perfusion is doing well and he is currently not having any significant symptoms.  Continue current medical regimen.  Recheck in 6 months.

## 2022-12-09 NOTE — Assessment & Plan Note (Signed)
blood glucose control important in reducing the progression of atherosclerotic disease. Also, involved in wound healing. On appropriate medications.  

## 2022-12-09 NOTE — Assessment & Plan Note (Signed)
blood pressure control important in reducing the progression of atherosclerotic disease. On appropriate oral medications.  

## 2022-12-09 NOTE — Assessment & Plan Note (Signed)
Now s/p pacemaker

## 2022-12-12 LAB — VAS US ABI WITH/WO TBI
Left ABI: 1.01
Right ABI: 0.98

## 2022-12-15 DIAGNOSIS — I25118 Atherosclerotic heart disease of native coronary artery with other forms of angina pectoris: Secondary | ICD-10-CM | POA: Diagnosis not present

## 2022-12-15 DIAGNOSIS — R0789 Other chest pain: Secondary | ICD-10-CM | POA: Diagnosis not present

## 2022-12-15 DIAGNOSIS — I5022 Chronic systolic (congestive) heart failure: Secondary | ICD-10-CM | POA: Diagnosis not present

## 2022-12-15 DIAGNOSIS — Z7901 Long term (current) use of anticoagulants: Secondary | ICD-10-CM | POA: Diagnosis not present

## 2022-12-23 DIAGNOSIS — D225 Melanocytic nevi of trunk: Secondary | ICD-10-CM | POA: Diagnosis not present

## 2022-12-23 DIAGNOSIS — I872 Venous insufficiency (chronic) (peripheral): Secondary | ICD-10-CM | POA: Diagnosis not present

## 2022-12-23 DIAGNOSIS — L400 Psoriasis vulgaris: Secondary | ICD-10-CM | POA: Diagnosis not present

## 2022-12-23 DIAGNOSIS — L408 Other psoriasis: Secondary | ICD-10-CM | POA: Diagnosis not present

## 2023-01-03 DIAGNOSIS — R0683 Snoring: Secondary | ICD-10-CM | POA: Diagnosis not present

## 2023-01-03 DIAGNOSIS — R0681 Apnea, not elsewhere classified: Secondary | ICD-10-CM | POA: Diagnosis not present

## 2023-01-03 DIAGNOSIS — G4733 Obstructive sleep apnea (adult) (pediatric): Secondary | ICD-10-CM | POA: Diagnosis not present

## 2023-01-18 DIAGNOSIS — I2510 Atherosclerotic heart disease of native coronary artery without angina pectoris: Secondary | ICD-10-CM | POA: Diagnosis not present

## 2023-01-18 DIAGNOSIS — E119 Type 2 diabetes mellitus without complications: Secondary | ICD-10-CM | POA: Diagnosis not present

## 2023-01-18 DIAGNOSIS — I739 Peripheral vascular disease, unspecified: Secondary | ICD-10-CM | POA: Diagnosis not present

## 2023-01-18 DIAGNOSIS — Z79899 Other long term (current) drug therapy: Secondary | ICD-10-CM | POA: Diagnosis not present

## 2023-01-18 DIAGNOSIS — G4733 Obstructive sleep apnea (adult) (pediatric): Secondary | ICD-10-CM | POA: Diagnosis not present

## 2023-01-18 DIAGNOSIS — K219 Gastro-esophageal reflux disease without esophagitis: Secondary | ICD-10-CM | POA: Diagnosis not present

## 2023-01-18 DIAGNOSIS — Z950 Presence of cardiac pacemaker: Secondary | ICD-10-CM | POA: Diagnosis not present

## 2023-02-14 ENCOUNTER — Encounter: Payer: Self-pay | Admitting: Student in an Organized Health Care Education/Training Program

## 2023-02-14 ENCOUNTER — Ambulatory Visit
Payer: BC Managed Care – PPO | Attending: Student in an Organized Health Care Education/Training Program | Admitting: Student in an Organized Health Care Education/Training Program

## 2023-02-14 VITALS — BP 98/56 | HR 101 | Temp 98.6°F | Resp 16 | Ht 67.0 in | Wt 210.0 lb

## 2023-02-14 DIAGNOSIS — I739 Peripheral vascular disease, unspecified: Secondary | ICD-10-CM | POA: Diagnosis not present

## 2023-02-14 DIAGNOSIS — G894 Chronic pain syndrome: Secondary | ICD-10-CM | POA: Insufficient documentation

## 2023-02-14 DIAGNOSIS — M792 Neuralgia and neuritis, unspecified: Secondary | ICD-10-CM | POA: Diagnosis not present

## 2023-02-14 DIAGNOSIS — E1140 Type 2 diabetes mellitus with diabetic neuropathy, unspecified: Secondary | ICD-10-CM | POA: Insufficient documentation

## 2023-02-14 MED ORDER — GABAPENTIN 100 MG PO CAPS: 100.0000 mg | ORAL_CAPSULE | Freq: Two times a day (BID) | ORAL | 5 refills | Status: AC

## 2023-02-14 NOTE — Progress Notes (Signed)
Safety precautions to be maintained throughout the outpatient stay will include: orient to surroundings, keep bed in low position, maintain call bell within reach at all times, provide assistance with transfer out of bed and ambulation.  

## 2023-02-14 NOTE — Progress Notes (Signed)
PROVIDER NOTE: Information contained herein reflects review and annotations entered in association with encounter. Interpretation of such information and data should be left to medically-trained personnel. Information provided to patient can be located elsewhere in the medical record under "Patient Instructions". Document created using STT-dictation technology, any transcriptional errors that may result from process are unintentional.    Patient: Damon English  Service Category: E/M  Provider: Edward Jolly, MD  DOB: 10-Nov-1950  DOS: 02/14/2023  Referring Provider: Arlyss Queen  MRN: [id number]  Specialty: Interventional Pain Management  PCP: [name], PA-C  Type: Established Patient  Setting: Ambulatory outpatient    Location: Office  Delivery: Face-to-face     HPI  [name], a 72 y.o. 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 Mr. Fabela primary complain today is Foot Pain (Bilateral peripheral nerve pain )   Pain Assessment: Severity of Chronic pain is reported as a 3 /10. Location: Foot Left, Right/denies. Onset: More than a month ago. Quality: Discomfort, Constant, Other (Comment), Numbness, Tingling (feet feel like they are asleep.). Timing: Constant. Modifying factor(s): gabapentin is helping,  Qutenza helped. Vitals:  height is 5\' 7"  (1.702 m) and weight is 210 lb (95.3 kg). His temporal temperature is 98.6 F (37 C). His blood pressure is 98/56 (abnormal) and his pulse is 101 (abnormal). His respiration is 16 and oxygen saturation is 97%.  BMI: Estimated body mass index is 32.89 kg/m as calculated from the following:   Height as of this encounter: 5\' 7"  (1.702 m).   Weight as of this encounter: 210 lb (95.3 kg). Last encounter: 11/23/2022. Last procedure: 10/26/2022.  Reason for encounter:  Patient would like to repeat Qutenza, last treatment  10/26/22, patient would like to repeat Finds benefit with  Gabapentin, refill below Discussed spinal cord stimulation as for chronic painful diabetic neuropathic pain.  Patient does have significant cardiac history and is anticoagulated with Eliquis and Plavix.  He states that he will discuss with his cardiologist if he could discontinue these medications for 14 days for spinal cord stim trial.  If he is cleared to do that, we will further discuss spinal cord stimulation with patient at next visit.    ROS  Constitutional: Denies any fever or chills Gastrointestinal: No reported hemesis, hematochezia, vomiting, or acute GI distress Musculoskeletal:  Bilateral feet paresthesias Neurological: No reported episodes of acute onset apraxia, aphasia, dysarthria, agnosia, amnesia, paralysis, loss of coordination, or loss of consciousness  Medication Review  DULoxetine, Sennosides, acetaminophen, apixaban, atorvastatin, clonazePAM, clopidogrel, diclofenac Sodium, empagliflozin, famotidine, furosemide, gabapentin, glipiZIDE, isosorbide mononitrate, latanoprost, losartan, metoprolol succinate, nitroGLYCERIN, polyethylene glycol, and ranolazine  History Review  Allergy: Mr. Pangborn is allergic to amoxicillin, hydromorphone hcl, and aspirin. Drug: Mr. Kaufhold  reports no history of drug use. Alcohol:  reports current alcohol use of about 7.0 standard drinks of alcohol per week. Tobacco:  reports that he quit smoking about 12 years ago. His smoking use included cigarettes. He has a 80.00 pack-year smoking history. He has never used smokeless tobacco. Social: Mr. Detwiler  reports that he quit smoking about 12 years ago. His smoking use included cigarettes. He has a 80.00 pack-year smoking history. He has never used smokeless tobacco. He reports current alcohol use of about 7.0 standard drinks of alcohol per week. He reports that he does not use drugs. Medical:  has a past medical history of Alcohol use, Diabetes mellitus without complication (HCC), History of  tobacco use,  Hypertension, Pulmonary embolism (HCC) (2012), and PVD (peripheral vascular disease) (HCC). Surgical: Mr. Portell  has a past surgical history that includes Vascular surgery; Cholecystectomy; Back surgery; Femoral artery - popliteal artery bypass graft (Right, 06/19/2018); ERCP w/ sphicterotomy (2015); Thromboendarterectomy (Right, 06/19/2018); Tonsillectomy; CORONARY ANGIOGRAPHY (N/A, 01/30/2019); Lower Extremity Angiography (Left, 01/14/2021); LEFT HEART CATH AND CORS/GRAFTS ANGIOGRAPHY (N/A, 06/25/2021); TEMPORARY PACEMAKER (N/A, 07/11/2021); and Lower Extremity Angiography (Left, 11/04/2021). Family: family history includes AAA (abdominal aortic aneurysm) in his father; Alzheimer's disease in his father; Heart attack in his father; Hypertension in his mother; Parkinson's disease in his mother; Vascular Disease in his father.  Laboratory Chemistry Profile   Renal Lab Results  Component Value Date   BUN 30 (H) 11/04/2021   CREATININE 1.53 (H) 11/04/2021   GFRAA >60 01/30/2019   GFRNONAA 49 (L) 11/04/2021    Hepatic Lab Results  Component Value Date   AST 28 07/11/2021   ALT 26 07/11/2021   ALBUMIN 3.8 07/11/2021   ALKPHOS 121 07/11/2021   LIPASE 31 01/28/2019    Electrolytes Lab Results  Component Value Date   NA 135 07/11/2021   K 4.2 07/11/2021   CL 100 07/11/2021   CALCIUM 9.5 07/11/2021   MG 1.9 07/11/2021   PHOS 4.2 06/25/2021    Bone No results found for: "VD25OH", "VD125OH2TOT", "ZO1096EA5", "WU9811BJ4", "25OHVITD1", "25OHVITD2", "25OHVITD3", "TESTOFREE", "TESTOSTERONE"  Inflammation (CRP: Acute Phase) (ESR: Chronic Phase) Lab Results  Component Value Date   LATICACIDVEN 1.3 06/19/2021         Note: Above Lab results reviewed.  Recent Imaging Review  VAS [place] LOWER EXTREMITY ARTERIAL DUPLEX LOWER EXTREMITY ARTERIAL DUPLEX STUDY  Patient Name:  Damon English  Date of Exam:   12/09/2022 Medical Rec #: [id number]         Accession #:    [id number] Date of  Birth: Aug 29, 1951          Patient Gender: M Patient Age:   12 years Exam Location:  [facility] Procedure:      VAS [place] LOWER EXTREMITY ARTERIAL DUPLEX Referring Phys: Sheppard Plumber  --------------------------------------------------------------------------------   Indications: Claudication, and peripheral artery disease.  High Risk Factors: Hypertension, Diabetes, past history of smoking, prior MI,                    coronary artery disease.   Vascular Interventions: 01/14/21: Bilateral CIA & right EIA stents;                         11/04/21: Left SFA-popliteal stent;. Current ABI:            Right: 0.98, Left: 1.01  Performing Technologist: Hardie Lora RVT    Examination Guidelines: A complete evaluation includes B-mode imaging, spectral Doppler, color Doppler, and power Doppler as needed of all accessible portions of each vessel. Bilateral testing is considered an integral part of a complete examination. Limited examinations for reoccurring indications may be performed as noted.        +----------+--------+-----+--------+----------+--------+ LEFT      PSV cm/sRatioStenosisWaveform  Comments +----------+--------+-----+--------+----------+--------+ CFA [id number]                  triphasic          +----------+--------+-----+--------+----------+--------+ DFA       49                   monophasic         +----------+--------+-----+--------+----------+--------+ SFA  Prox  48                   biphasic           +----------+--------+-----+--------+----------+--------+ SFA Mid   42                   biphasic           +----------+--------+-----+--------+----------+--------+ SFA Distal91                   biphasic           +----------+--------+-----+--------+----------+--------+ POP Prox  94                   biphasic           +----------+--------+-----+--------+----------+--------+ POP Distal76                    biphasic           +----------+--------+-----+--------+----------+--------+ ATA Distal62                   biphasic           +----------+--------+-----+--------+----------+--------+ PTA Distal70                   biphasic           +----------+--------+-----+--------+----------+--------+  A focal velocity elevation of 94 cm/s was obtained at Proximal popliteal with a VR of 1.4. Findings are characteristic of 30-49% stenosis.   Left Stent(s): +----------------+--------+--------------+--------+--------------------------+ SFA to PoplitealPSV cm/sStenosis      WaveformComments                   +----------------+--------+--------------+--------+--------------------------+ Prox to Stent   57                    biphasic                           +----------------+--------+--------------+--------+--------------------------+ Proximal Stent  52                    biphasic                           +----------------+--------+--------------+--------+--------------------------+ Mid Stent       34                    biphasic                           +----------------+--------+--------------+--------+--------------------------+ Distal Stent    162     1-49% stenosisbiphasicIrregular calcified plaque +----------------+--------+--------------+--------+--------------------------+ Distal to Stent 70                    biphasic                           +----------------+--------+--------------+--------+--------------------------+           Summary: Left: 30-49% stenosis noted in the popliteal artery. Patent stent with no evidence of stenosis in the superficial femoral and popliteal artery artery.    See table(s) above for measurements and observations.  Electronically signed by Festus Barren MD on 12/12/2022 at 9:36:58 AM.       Final   VAS [place] ABI WITH/WO TBI  LOWER EXTREMITY DOPPLER STUDY  Patient Name:  ADA WOODBURY  Date of  Exam:    12/09/2022 Medical Rec #: [id number]         Accession #:    [id number] Date of Birth: [birth year]          Patient Gender: M Patient Age:   74 years Exam Location:  [facility] Procedure:      VAS [place] ABI WITH/WO TBI Referring Phys: Sheppard Plumber  --------------------------------------------------------------------------------   Indications: Peripheral artery disease.  High Risk Factors: Hypertension, Diabetes, past history of smoking, prior MI,                    coronary artery disease.   Vascular Interventions: 01/14/21: Bilateral CIA & right EIA stents;                         11/04/21: Left SFA-popliteal stent;.  Performing Technologist: Hardie Lora RVT    Examination Guidelines: A complete evaluation includes at minimum, Doppler waveform signals and systolic blood pressure reading at the level of bilateral brachial, anterior tibial, and posterior tibial arteries, when vessel segments are accessible. Bilateral testing is considered an integral part of a complete examination. Photoelectric Plethysmograph (PPG) waveforms and toe systolic pressure readings are included as required and additional duplex testing as needed. Limited examinations for reoccurring indications may be performed as noted.    ABI Findings: +---------+------------------+-----+---------+--------+ Right    Rt Pressure (mmHg)IndexWaveform Comment  +---------+------------------+-----+---------+--------+ Brachial 136                                      +---------+------------------+-----+---------+--------+ PTA      133               0.98 triphasic         +---------+------------------+-----+---------+--------+ DP       117               0.86 triphasic         +---------+------------------+-----+---------+--------+ Great Toe77                0.57                    +---------+------------------+-----+---------+--------+  +---------+------------------+-----+---------+-------+ Left     Lt Pressure (mmHg)IndexWaveform Comment +---------+------------------+-----+---------+-------+ Brachial 132                                     +---------+------------------+-----+---------+-------+ PTA      137               1.01 triphasic        +---------+------------------+-----+---------+-------+ DP       127               0.93 triphasic        +---------+------------------+-----+---------+-------+ Great Toe54                0.40                  +---------+------------------+-----+---------+-------+  +-------+-----------+-----------+------------+------------+ ABI/TBIToday's ABIToday's TBIPrevious ABIPrevious TBI +-------+-----------+-----------+------------+------------+ Right  0.98       0.57       1.00        0.70         +-------+-----------+-----------+------------+------------+ Left   1.01       0.40       1.01  0.49         +-------+-----------+-----------+------------+------------+  Bilateral ABIs appear essentially unchanged compared to prior study on 02/15/2022.   Summary: Right: Resting right ankle-brachial index is within normal range. The right toe-brachial index is abnormal.  Left: Resting left ankle-brachial index is within normal range. The left toe-brachial index is abnormal.  *See table(s) above for measurements and observations.    Electronically signed by Festus Barren MD on 12/12/2022 at 9:36:51 AM.      Final   Note: Reviewed        Physical Exam  General appearance: Well nourished, well developed, and well hydrated. In no apparent acute distress Mental status: Alert, oriented x 3 (person, place, & time)       Respiratory: No evidence of acute respiratory distress Eyes: PERLA Vitals: BP (!) 98/56 (BP Location: Left Arm, Patient Position: Sitting, Cuff Size: Normal)   Pulse (!)  101   Temp 98.6 F (37 C) (Temporal)   Resp 16   Ht 5\' 7"  (1.702 m)   Wt 210 lb (95.3 kg)   SpO2 97%   BMI 32.89 kg/m  BMI: Estimated body mass index is 32.89 kg/m as calculated from the following:   Height as of this encounter: 5\' 7"  (1.702 m).   Weight as of this encounter: 210 lb (95.3 kg). Ideal: Ideal body weight: 66.1 kg (145 lb 11.6 oz) Adjusted ideal body weight: 77.8 kg (171 lb 6.9 oz)  Paresthesias of bilateral feet  Assessment   Diagnosis Status  1. Chronic painful diabetic neuropathy (HCC)   2. Peripheral arterial disease (HCC)   3. Intractable neuropathic pain of lower extremity, right   4. Chronic pain syndrome    Persistent Controlled Persistent    Plan of Care    Mr. BIENVENIDO PROEHL has a current medication list which includes the following long-term medication(s): clonazepam, clonazepam, duloxetine, famotidine, nitroglycerin, and gabapentin.  Pharmacotherapy (Medications Ordered): Meds ordered this encounter  Medications   gabapentin (NEURONTIN) 100 MG capsule    Sig: Take 1-2 capsules (100-200 mg total) by mouth 2 (two) times daily. Follow written titration schedule    Dispense:  120 capsule    Refill:  5    Fill one day early if pharmacy is closed on scheduled refill date. May substitute for generic if available.   Orders:  Orders Placed This Encounter  Procedures   NEUROLYSIS    Please order Qutenza patches from pharmacy    Standing Status:   Future    Standing Expiration Date:   05/16/2023    Order Specific Question:   Where will this procedure be performed?    Answer:   [facility]   Follow-up plan:   Return in about 20 days (around 03/06/2023) for Qutenza.      Qutenza 03/09/22, 06/15/22, 10/26/22      Recent Visits Date Type Provider Dept  11/23/22 Office Visit Edward Jolly, MD [facility]  Showing recent visits within past 90 days and meeting all other requirements Today's Visits Date Type Provider Dept   02/14/23 Office Visit Edward Jolly, MD [facility]  Showing today's visits and meeting all other requirements Future Appointments No visits were found meeting these conditions. Showing future appointments within next 90 days and meeting all other requirements  I discussed the assessment and treatment plan with the patient. The patient was provided an opportunity to ask questions and all were answered. The patient agreed with the plan and demonstrated an understanding of the  instructions.  Patient advised to call back or seek an in-person evaluation if the symptoms or condition worsens.  Duration of encounter: .  Total time on encounter, as per AMA guidelines included both the face-to-face and non-face-to-face time personally spent by the physician and/or other qualified health care professional(s) on the day of the encounter (includes time in activities that require the physician or other qualified health care professional and does not include time in activities normally performed by clinical staff). Physician's time may include the following activities when performed: Preparing to see the patient (e.g., pre-charting review of records, searching for previously ordered imaging, lab work, and nerve conduction tests) Review of prior analgesic pharmacotherapies. Reviewing PMP Interpreting ordered tests (e.g., lab work, imaging, nerve conduction tests) Performing post-procedure evaluations, including interpretation of diagnostic procedures Obtaining and/or reviewing separately obtained history Performing a medically appropriate examination and/or evaluation Counseling and educating the patient/family/caregiver Ordering medications, tests, or procedures Referring and communicating with other health care professionals (when not separately reported) Documenting clinical information in the electronic or other health record Independently interpreting results (not separately  reported) and communicating results to the patient/ family/caregiver Care coordination (not separately reported)  Note by: Edward Jolly, MD Date: 02/14/2023; Time: 3:03 PM

## 2023-02-23 DIAGNOSIS — N289 Disorder of kidney and ureter, unspecified: Secondary | ICD-10-CM | POA: Diagnosis not present

## 2023-03-08 ENCOUNTER — Ambulatory Visit: Payer: BC Managed Care – PPO | Admitting: Student in an Organized Health Care Education/Training Program

## 2023-03-14 ENCOUNTER — Telehealth: Payer: Self-pay

## 2023-03-14 NOTE — Telephone Encounter (Signed)
Pt is not on CPAP at this time. Was told he needed a cpap titration/overnight lab. Doesn't want to be overnight at [facility].

## 2023-03-14 NOTE — Telephone Encounter (Signed)
Lm for patient to ask if he uses cpap currently.

## 2023-03-14 NOTE — Telephone Encounter (Signed)
Noted  

## 2023-03-15 ENCOUNTER — Institutional Professional Consult (permissible substitution): Payer: BC Managed Care – PPO | Admitting: Nurse Practitioner

## 2023-03-15 ENCOUNTER — Ambulatory Visit (INDEPENDENT_AMBULATORY_CARE_PROVIDER_SITE_OTHER): Payer: BC Managed Care – PPO | Admitting: Nurse Practitioner

## 2023-03-15 ENCOUNTER — Encounter: Payer: Self-pay | Admitting: Nurse Practitioner

## 2023-03-15 VITALS — BP 118/72 | HR 91 | Temp 97.9°F | Ht 67.0 in | Wt 217.0 lb

## 2023-03-15 DIAGNOSIS — E669 Obesity, unspecified: Secondary | ICD-10-CM

## 2023-03-15 DIAGNOSIS — I5022 Chronic systolic (congestive) heart failure: Secondary | ICD-10-CM | POA: Diagnosis not present

## 2023-03-15 DIAGNOSIS — E66811 Obesity, class 1: Secondary | ICD-10-CM | POA: Insufficient documentation

## 2023-03-15 DIAGNOSIS — G4733 Obstructive sleep apnea (adult) (pediatric): Secondary | ICD-10-CM | POA: Diagnosis not present

## 2023-03-15 NOTE — Assessment & Plan Note (Signed)
BMI 33.9. healthy weight loss encouraged.  

## 2023-03-15 NOTE — Progress Notes (Signed)
Reviewed and agree with assessment/plan.   Coralyn Helling, MD [facility] Pulmonary/Critical Care 03/15/2023, 3:37 PM Pager:  [phone]

## 2023-03-15 NOTE — Patient Instructions (Signed)
You have moderate sleep apnea based on your sleep study results.    Start CPAP 5-15 cmH2O every night, minimum of 4-6 hours a night.  Change equipment every 30 days or as directed by DME. Wash your tubing with warm soap and water daily, hang to dry. Wash humidifier portion weekly. You will need to change the water in your water chamber out daily with bottled, distilled water.  Typical recommended timeframes for changing supplies: -Every month Mask cushions and/or nasal pillows CPAP machine filters -Every 3 months Mask frame (not including the headgear) CPAP tubing -Every 6 months Mask headgear Chin strap (if applicable) Humidifier water tub   Be aware of reduced alertness and do not drive or operate heavy machinery if experiencing this or drowsiness.  Exercise encouraged, as tolerated. Notify if persistent daytime sleepiness occurs even with consistent use of CPAP.   We discussed how untreated sleep apnea puts an individual at risk for cardiac arrhthymias, pulm HTN, DM, stroke and increases their risk for daytime accidents. We also briefly reviewed treatment options including weight loss, side sleeping position, oral appliance, CPAP therapy or referral to ENT for possible surgical options   Follow up in 8-12 weeks with Dr. Craige Cotta or Florentina Addison Russell Engelstad,NP to see how CPAP is going, or sooner, if needed. If you have not heard from a medical supply company in 2 weeks regarding your CPAP, please call our office.

## 2023-03-15 NOTE — Progress Notes (Signed)
@Patient  ID: Damon English, male    DOB: [birth year], 72 y.o.   MRN: [id number]  Chief Complaint  Patient presents with   Consult    Sleep study on 01/03/2023. Told that he snores and wakes up gasping for air. Tired all the time.     Referring provider: Arlyss Queen  HPI: 72 year old male, former smoker referred for sleep consult.  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  TEST/EVENTS:  01/03/2023 HST: REI 17.7/h, 100% obstructive; SpO2 low 63%  03/06/2023: Today-sleep consult Patient presents today for sleep consult, referred by [name], PA.  He had a home sleep study in March 2024 with [facility] which revealed moderate obstructive sleep apnea.  He had been having difficulties with his sleep for many years prior to this.  He wanted to switch care over to [facility] so he asked to have sleep consult here.  He was never started on CPAP therapy after his study and has never been on CPAP in the past.  He continues to have a lot of trouble with his sleep.  Feels tired in the morning; wants to sleep all day.  Significant daytime fatigue symptoms.  Usually has dry mouth when he wakes up.  Has loud snoring.  His wife is noticed that he gasps awake and sounds like he is choking at times.  Has had some sleepiness on longer distance drives but no issues around town and has never fallen asleep while driving. Some sleep talking. No sleep walking/paralysis.  He goes to bed between 9 to 11 PM.  Falls asleep quickly.  Wakes 2 or more times a night.  Usually gets up around 9:10 AM. No sleep aids.  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  Weight is up 10 to 15 pounds over the last 2 years. He has  a significant cardiac history as well as history of stroke and diabetes.  He is a former smoker; quit 10 years ago.  Usually drinks 6-8 beers a day.  He has a past history of marijuana and cocaine use; quit 25 years ago.  No excessive caffeine intake.  Lives with his wife.  He is self-employed.  Has an adult child.  Family history of heart disease and cancer.  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  Is on NOAC for now.  No ASA with it. Not allergic per patient.  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  Is on NOAC for now.  No ASA with it. Not allergic per patient.   Per pt not an allergy    Immunization History  Administered Date(s) Administered   Influenza-Unspecified 07/17/2022   PFIZER Comirnaty(Gray Top)Covid-19 Tri-Sucrose Vaccine 10/30/2020   Pneumococcal Polysaccharide-23 01/29/2019    Past Medical History:  Diagnosis Date   Alcohol use    "cutting back" but still heavy and daily, multiple shots of bourbon each night  Diabetes mellitus without complication (HCC)    History of tobacco use    reportedly quit in 2013   Hypertension    Pulmonary embolism (HCC) 2012   unprovoked   PVD (peripheral vascular disease) (HCC)     Tobacco History: Social History   Tobacco Use  Smoking Status Former   Packs/day: 2.00   Years: 40.00   Additional pack years: 0.00   Total pack years: 80.00   Types: Cigarettes   Quit date: 11/27/2010   Years since quitting: 12.3  Smokeless Tobacco Never  Tobacco Comments   Quit in 2012   Counseling given: Not Answered Tobacco comments: Quit in 2012   Outpatient Medications Prior to Visit   Medication Sig Dispense Refill   acetaminophen (TYLENOL) 325 MG tablet Take 2 tablets (650 mg total) by mouth every 6 (six) hours as needed for fever. 20 tablet 0   apixaban (ELIQUIS) 5 MG TABS tablet Take by mouth.     atorvastatin (LIPITOR) 80 MG tablet Take 80 mg by mouth daily.     clonazePAM (KLONOPIN) 0.25 MG disintegrating tablet Take 1-2 tablets (0.25-0.5 mg total) by mouth 3 (three) times daily as needed (anxiety/agitation). 20 tablet 0   clonazePAM (KLONOPIN) 0.5 MG tablet Take 0.5 mg by mouth 2 (two) times daily as needed.     clopidogrel (PLAVIX) 75 MG tablet Take 1 tablet (75 mg total) by mouth daily. 30 tablet 0   diclofenac Sodium (VOLTAREN) 1 % GEL Apply 4 g topically 4 (four) times daily.     DULoxetine (CYMBALTA) 30 MG capsule Take 30 mg by mouth daily.     famotidine (PEPCID) 40 MG tablet Take 1 tablet by mouth every morning.     furosemide (LASIX) 20 MG tablet Take 40 mg by mouth daily as needed (Edema (Or weight gain of >2lbs overnight or 5lbs in one week)).     gabapentin (NEURONTIN) 100 MG capsule Take 1-2 capsules (100-200 mg total) by mouth 2 (two) times daily. Follow written titration schedule 120 capsule 5   glipiZIDE (GLUCOTROL) 10 MG tablet Take 5 mg by mouth as needed.     isosorbide mononitrate (IMDUR) 30 MG 24 hr tablet Take 30 mg by mouth at bedtime.     JARDIANCE 10 MG TABS tablet Take 10 mg by mouth daily.     latanoprost (XALATAN) 0.005 % ophthalmic solution Place 1 drop into both eyes at bedtime. 2.5 mL 12   losartan (COZAAR) 25 MG tablet Take 25 mg by mouth daily.     metoprolol succinate (TOPROL-XL) 25 MG 24 hr tablet Take 25 mg by mouth at bedtime.     nitroGLYCERIN (NITROSTAT) 0.4 MG SL tablet Place 1 tablet (0.4 mg total) under the tongue every 5 (five) minutes as needed for chest pain. 20 tablet 12   pantoprazole (PROTONIX) 40 MG tablet Take 40 mg by mouth daily.     polyethylene glycol (MIRALAX / GLYCOLAX) 17 g packet Take 17 g by mouth daily as  needed for mild constipation or moderate constipation. 14 each 0   ranolazine (RANEXA) 500 MG 12 hr tablet Take 500 mg by mouth 2 (two) times daily.     SENNOSIDES PO Take by mouth.     spironolactone (ALDACTONE) 25 MG tablet Take 25 mg by mouth daily.     No facility-administered medications prior to visit.     Review of Systems:   Constitutional: No night sweats, fevers, chills, or lassitude. +daytime fatigue, weight gain  HEENT: No  headaches, difficulty swallowing, tooth/dental problems, or sore throat. No sneezing, itching, ear ache, nasal congestion, or post nasal drip CV:  No chest pain, orthopnea, PND, swelling in lower extremities, anasarca, dizziness, palpitations, syncope Resp: +snoring, witnessed apneas; shortness of breath with exertion (baseline; stable). No excess mucus or change in color of mucus. No productive or non-productive. No hemoptysis. No wheezing.  No chest wall deformity GI:  +occasional heartburn, indigestion. No abdominal pain, nausea, vomiting, diarrhea, change in bowel habits, loss of appetite, bloody stools.  GU: No dysuria, change in color of urine, urgency or frequency, nocturia  Skin: No rash, lesions, ulcerations. +easily bruised (on chronic AC) MSK:  +chronic joint pains. No joint swelling.   Neuro: No dizziness or lightheadedness.  Psych: +depression, anxiety (stable; baseline). No SI/HI. Mood stable. +sleep disturbance    Physical Exam:  BP 118/72 (BP Location: Left Arm, Cuff Size: Large)   Pulse 91   Temp 97.9 F (36.6 C)   Ht 5\' 7"  (1.702 m)   Wt 217 lb (98.4 kg)   SpO2 97%   BMI 33.99 kg/m   GEN: Pleasant, interactive, well-kempt; obese; in no acute distress. HEENT:  Normocephalic and atraumatic. PERRLA. Sclera white. Nasal turbinates pink, moist and patent bilaterally. No rhinorrhea present. Oropharynx pink and moist, without exudate or edema. No lesions, ulcerations, or postnasal drip. Mallampati III NECK:  Supple w/ fair ROM. No JVD  present. Normal carotid impulses w/o bruits. Thyroid symmetrical with no goiter or nodules palpated. No lymphadenopathy.   CV: RRR, no m/r/g, no peripheral edema. Pulses intact, +2 bilaterally. No cyanosis, pallor or clubbing. PULMONARY:  Unlabored, regular breathing. Clear bilaterally A&P w/o wheezes/rales/rhonchi. No accessory muscle use.  GI: BS present and normoactive. Soft, non-tender to palpation. No organomegaly or masses detected. MSK: No erythema, warmth or tenderness. Cap refil <2 sec all extrem. No deformities or joint swelling noted.  Neuro: A/Ox3. No focal deficits noted.   Skin: Warm, no lesions or rashe Psych: Normal affect and behavior. Judgement and thought content appropriate.     Lab Results:  CBC    Component Value Date/Time   WBC 9.9 07/11/2021 1317   RBC 4.29 07/11/2021 1317   HGB 13.6 07/11/2021 1317   HGB 16.1 11/08/2013 1530   HCT 41.1 07/11/2021 1317   HCT 46.4 11/08/2013 1530   PLT 158 07/11/2021 1317   PLT 173 11/08/2013 1530   MCV 95.8 07/11/2021 1317   MCV 104 (H) 11/08/2013 1530   MCH 31.7 07/11/2021 1317   MCHC 33.1 07/11/2021 1317   RDW 16.1 (H) 07/11/2021 1317   RDW 12.3 11/08/2013 1530   LYMPHSABS 1.4 07/11/2021 1317   MONOABS 0.9 07/11/2021 1317   EOSABS 0.1 07/11/2021 1317   BASOSABS 0.1 07/11/2021 1317    BMET    Component Value Date/Time   NA 135 07/11/2021 1317   NA 136 11/08/2013 1530   K 4.2 07/11/2021 1317   K 3.4 (L) 11/08/2013 1530   CL 100 07/11/2021 1317   CL 102 11/08/2013 1530   CO2 21 (L) 07/11/2021 1317   CO2 27 11/08/2013 1530   GLUCOSE 139 (H) 07/11/2021 1317   GLUCOSE 150 (H) 11/08/2013 1530   BUN 30 (H) 11/04/2021 1026   BUN 16 11/08/2013 1530   CREATININE 1.53 (H) 11/04/2021 1026   CREATININE 1.07 11/08/2013 1530   CALCIUM 9.5 07/11/2021 1317   CALCIUM 9.3 11/08/2013 1530   GFRNONAA 49 (L) 11/04/2021 1026   GFRNONAA >60 11/08/2013 1530   GFRAA >60  01/30/2019 0652   GFRAA >60 11/08/2013 1530    BNP     Component Value Date/Time   BNP 218.5 (H) 07/11/2021 1317     Imaging:  No results found.        No data to display          No results found for: "NITRICOXIDE"      Assessment & Plan:   Moderate obstructive sleep apnea Moderate OSA with 17.7/h. Reviewed risks of untreated OSA and potential treatment options. Recommendation was for CPAP therapy given severity and cardiac history. He was agreeable to this. Orders placed for auto CPAP 5-15 cmH2O. Educated on purpose of CPAP and proper use/care of device. Reviewed risks/benefits. Cautioned on safe driving practices.   Patient Instructions  You have moderate sleep apnea based on your sleep study results.    Start CPAP 5-15 cmH2O every night, minimum of 4-6 hours a night.  Change equipment every 30 days or as directed by DME. Wash your tubing with warm soap and water daily, hang to dry. Wash humidifier portion weekly. You will need to change the water in your water chamber out daily with bottled, distilled water.  Typical recommended timeframes for changing supplies: -Every month Mask cushions and/or nasal pillows CPAP machine filters -Every 3 months Mask frame (not including the headgear) CPAP tubing -Every 6 months Mask headgear Chin strap (if applicable) Humidifier water tub   Be aware of reduced alertness and do not drive or operate heavy machinery if experiencing this or drowsiness.  Exercise encouraged, as tolerated. Notify if persistent daytime sleepiness occurs even with consistent use of CPAP.   We discussed how untreated sleep apnea puts an individual at risk for cardiac arrhthymias, pulm HTN, DM, stroke and increases their risk for daytime accidents. We also briefly reviewed treatment options including weight loss, side sleeping position, oral appliance, CPAP therapy or referral to ENT for possible surgical options   Follow up in 8-12 weeks with Dr. Craige Cotta or Florentina Addison Elena Cothern,NP to see how CPAP is going, or sooner,  if needed. If you have not heard from a medical supply company in 2 weeks regarding your CPAP, please call our office.    Obesity (BMI 30.0-34.9) BMI 33.9. healthy weight loss encouraged  Chronic systolic CHF (congestive heart failure) (HCC) Euvolemic. Follow up with cardiology as scheduled.   I spent 35 minutes of dedicated to the care of this patient on the date of this encounter to include pre-visit review of records, face-to-face time with the patient discussing conditions above, post visit ordering of testing, clinical documentation with the electronic health record, making appropriate referrals as documented, and communicating necessary findings to members of the patients care team.  Noemi Chapel, NP 03/15/2023  Pt aware and understands NP's role.

## 2023-03-15 NOTE — Assessment & Plan Note (Signed)
Euvolemic. Follow up with cardiology as scheduled.  

## 2023-03-15 NOTE — Assessment & Plan Note (Signed)
Moderate OSA with 17.7/h. Reviewed risks of untreated OSA and potential treatment options. Recommendation was for CPAP therapy given severity and cardiac history. He was agreeable to this. Orders placed for auto CPAP 5-15 cmH2O. Educated on purpose of CPAP and proper use/care of device. Reviewed risks/benefits. Cautioned on safe driving practices.   Patient Instructions  You have moderate sleep apnea based on your sleep study results.    Start CPAP 5-15 cmH2O every night, minimum of 4-6 hours a night.  Change equipment every 30 days or as directed by DME. Wash your tubing with warm soap and water daily, hang to dry. Wash humidifier portion weekly. You will need to change the water in your water chamber out daily with bottled, distilled water.  Typical recommended timeframes for changing supplies: -Every month Mask cushions and/or nasal pillows CPAP machine filters -Every 3 months Mask frame (not including the headgear) CPAP tubing -Every 6 months Mask headgear Chin strap (if applicable) Humidifier water tub   Be aware of reduced alertness and do not drive or operate heavy machinery if experiencing this or drowsiness.  Exercise encouraged, as tolerated. Notify if persistent daytime sleepiness occurs even with consistent use of CPAP.   We discussed how untreated sleep apnea puts an individual at risk for cardiac arrhthymias, pulm HTN, DM, stroke and increases their risk for daytime accidents. We also briefly reviewed treatment options including weight loss, side sleeping position, oral appliance, CPAP therapy or referral to ENT for possible surgical options   Follow up in 8-12 weeks with Dr. Craige Cotta or Florentina Addison Amiri Tritch,NP to see how CPAP is going, or sooner, if needed. If you have not heard from a medical supply company in 2 weeks regarding your CPAP, please call our office.

## 2023-03-16 ENCOUNTER — Telehealth: Payer: Self-pay | Admitting: Student

## 2023-03-16 NOTE — Telephone Encounter (Signed)
Steward Drone, wife calling, OK to send CPAP order to [facility]. He decided not to get one from his friend who has a medical supply store.

## 2023-03-17 NOTE — Telephone Encounter (Signed)
Per the patient's chart, the CPAP order was placed on 03/15/2023 and Synetta Fail is waiting on a signature before she can send it over.  I notified the patient's wife(DPR). I told her we will get it sent over as soon as Florentina Addison is back in the office and signs the order. I told her [facility] will reach out to them about picking up the CPAP.  Nothing further needed.

## 2023-03-22 DIAGNOSIS — I25810 Atherosclerosis of coronary artery bypass graft(s) without angina pectoris: Secondary | ICD-10-CM | POA: Diagnosis not present

## 2023-03-22 DIAGNOSIS — I872 Venous insufficiency (chronic) (peripheral): Secondary | ICD-10-CM | POA: Diagnosis not present

## 2023-03-22 DIAGNOSIS — Z8673 Personal history of transient ischemic attack (TIA), and cerebral infarction without residual deficits: Secondary | ICD-10-CM | POA: Diagnosis not present

## 2023-03-22 DIAGNOSIS — E782 Mixed hyperlipidemia: Secondary | ICD-10-CM | POA: Diagnosis not present

## 2023-04-11 DIAGNOSIS — G4733 Obstructive sleep apnea (adult) (pediatric): Secondary | ICD-10-CM | POA: Diagnosis not present

## 2023-04-13 DIAGNOSIS — Z8719 Personal history of other diseases of the digestive system: Secondary | ICD-10-CM | POA: Diagnosis not present

## 2023-04-13 DIAGNOSIS — D369 Benign neoplasm, unspecified site: Secondary | ICD-10-CM | POA: Diagnosis not present

## 2023-04-13 DIAGNOSIS — K219 Gastro-esophageal reflux disease without esophagitis: Secondary | ICD-10-CM | POA: Diagnosis not present

## 2023-04-13 DIAGNOSIS — Z950 Presence of cardiac pacemaker: Secondary | ICD-10-CM | POA: Diagnosis not present

## 2023-04-19 DIAGNOSIS — Z7901 Long term (current) use of anticoagulants: Secondary | ICD-10-CM | POA: Diagnosis not present

## 2023-04-19 DIAGNOSIS — I110 Hypertensive heart disease with heart failure: Secondary | ICD-10-CM | POA: Diagnosis not present

## 2023-04-19 DIAGNOSIS — I5022 Chronic systolic (congestive) heart failure: Secondary | ICD-10-CM | POA: Diagnosis not present

## 2023-04-19 DIAGNOSIS — I4901 Ventricular fibrillation: Secondary | ICD-10-CM | POA: Diagnosis not present

## 2023-04-25 DIAGNOSIS — I25118 Atherosclerotic heart disease of native coronary artery with other forms of angina pectoris: Secondary | ICD-10-CM | POA: Diagnosis not present

## 2023-04-25 DIAGNOSIS — I5022 Chronic systolic (congestive) heart failure: Secondary | ICD-10-CM | POA: Diagnosis not present

## 2023-04-25 DIAGNOSIS — I5023 Acute on chronic systolic (congestive) heart failure: Secondary | ICD-10-CM | POA: Diagnosis not present

## 2023-04-25 DIAGNOSIS — I2510 Atherosclerotic heart disease of native coronary artery without angina pectoris: Secondary | ICD-10-CM | POA: Diagnosis not present

## 2023-04-25 DIAGNOSIS — Z951 Presence of aortocoronary bypass graft: Secondary | ICD-10-CM | POA: Diagnosis not present

## 2023-04-25 DIAGNOSIS — E119 Type 2 diabetes mellitus without complications: Secondary | ICD-10-CM | POA: Diagnosis not present

## 2023-04-25 DIAGNOSIS — I10 Essential (primary) hypertension: Secondary | ICD-10-CM | POA: Diagnosis not present

## 2023-04-27 DIAGNOSIS — E113292 Type 2 diabetes mellitus with mild nonproliferative diabetic retinopathy without macular edema, left eye: Secondary | ICD-10-CM | POA: Diagnosis not present

## 2023-05-04 DIAGNOSIS — Z86711 Personal history of pulmonary embolism: Secondary | ICD-10-CM | POA: Diagnosis not present

## 2023-05-04 DIAGNOSIS — E119 Type 2 diabetes mellitus without complications: Secondary | ICD-10-CM | POA: Diagnosis not present

## 2023-05-04 DIAGNOSIS — U071 COVID-19: Secondary | ICD-10-CM | POA: Diagnosis not present

## 2023-05-04 DIAGNOSIS — R6889 Other general symptoms and signs: Secondary | ICD-10-CM | POA: Diagnosis not present

## 2023-05-11 DIAGNOSIS — G4733 Obstructive sleep apnea (adult) (pediatric): Secondary | ICD-10-CM | POA: Diagnosis not present

## 2023-05-12 DIAGNOSIS — X58XXXA Exposure to other specified factors, initial encounter: Secondary | ICD-10-CM | POA: Diagnosis not present

## 2023-05-12 DIAGNOSIS — R1011 Right upper quadrant pain: Secondary | ICD-10-CM | POA: Diagnosis not present

## 2023-05-12 DIAGNOSIS — K430 Incisional hernia with obstruction, without gangrene: Secondary | ICD-10-CM | POA: Diagnosis not present

## 2023-05-12 DIAGNOSIS — R109 Unspecified abdominal pain: Secondary | ICD-10-CM | POA: Diagnosis not present

## 2023-05-12 DIAGNOSIS — S301XXA Contusion of abdominal wall, initial encounter: Secondary | ICD-10-CM | POA: Diagnosis not present

## 2023-05-16 DIAGNOSIS — I6381 Other cerebral infarction due to occlusion or stenosis of small artery: Secondary | ICD-10-CM | POA: Diagnosis not present

## 2023-05-16 DIAGNOSIS — E782 Mixed hyperlipidemia: Secondary | ICD-10-CM | POA: Diagnosis not present

## 2023-05-16 DIAGNOSIS — Z0189 Encounter for other specified special examinations: Secondary | ICD-10-CM | POA: Diagnosis not present

## 2023-05-16 DIAGNOSIS — Z8673 Personal history of transient ischemic attack (TIA), and cerebral infarction without residual deficits: Secondary | ICD-10-CM | POA: Diagnosis not present

## 2023-05-16 DIAGNOSIS — Z951 Presence of aortocoronary bypass graft: Secondary | ICD-10-CM | POA: Diagnosis not present

## 2023-05-16 DIAGNOSIS — I5022 Chronic systolic (congestive) heart failure: Secondary | ICD-10-CM | POA: Diagnosis not present

## 2023-05-16 DIAGNOSIS — Z950 Presence of cardiac pacemaker: Secondary | ICD-10-CM | POA: Diagnosis not present

## 2023-05-16 DIAGNOSIS — I25810 Atherosclerosis of coronary artery bypass graft(s) without angina pectoris: Secondary | ICD-10-CM | POA: Diagnosis not present

## 2023-05-23 DIAGNOSIS — I739 Peripheral vascular disease, unspecified: Secondary | ICD-10-CM | POA: Diagnosis not present

## 2023-05-23 DIAGNOSIS — I951 Orthostatic hypotension: Secondary | ICD-10-CM | POA: Diagnosis not present

## 2023-05-23 DIAGNOSIS — I5022 Chronic systolic (congestive) heart failure: Secondary | ICD-10-CM | POA: Diagnosis not present

## 2023-05-23 DIAGNOSIS — Z951 Presence of aortocoronary bypass graft: Secondary | ICD-10-CM | POA: Diagnosis not present

## 2023-05-31 ENCOUNTER — Telehealth: Payer: Self-pay

## 2023-05-31 NOTE — Telephone Encounter (Signed)
ATC Brad with [facility] to have patient added to airview.  Spoke to patient's spouse, Brenda(DPR). Patient does not have SD card.  Will call Nida Boatman again on 08/15

## 2023-06-01 NOTE — Telephone Encounter (Signed)
Spoke to [place]. Patient added to airview.  Download printed. Nothing further needed.

## 2023-06-02 ENCOUNTER — Ambulatory Visit: Payer: BC Managed Care – PPO | Admitting: Nurse Practitioner

## 2023-06-02 ENCOUNTER — Encounter: Payer: Self-pay | Admitting: Nurse Practitioner

## 2023-06-02 VITALS — BP 128/80 | HR 86 | Temp 97.9°F | Ht 67.0 in | Wt 209.4 lb

## 2023-06-02 DIAGNOSIS — G4733 Obstructive sleep apnea (adult) (pediatric): Secondary | ICD-10-CM

## 2023-06-02 DIAGNOSIS — E669 Obesity, unspecified: Secondary | ICD-10-CM | POA: Diagnosis not present

## 2023-06-02 DIAGNOSIS — E66811 Obesity, class 1: Secondary | ICD-10-CM

## 2023-06-02 NOTE — Patient Instructions (Addendum)
Continue CPAP every night, minimum of 4-6 hours a night.  Change equipment every 30 days or as directed by DME. Wash your tubing with warm soap and water daily, hang to dry. Wash humidifier portion weekly. You will need to change the water in your water chamber out daily with bottled, distilled water.   Adjust CPAP settings to 5-9 cmH2O Go for a mask fitting - someone will contact you to schedule this   Typical recommended timeframes for changing supplies: -Every month Mask cushions and/or nasal pillows CPAP machine filters -Every 3 months Mask frame (not including the headgear) CPAP tubing -Every 6 months Mask headgear Chin strap (if applicable) Humidifier water tub   Be aware of reduced alertness and do not drive or operate heavy machinery if experiencing this or drowsiness.  Exercise encouraged, as tolerated. Notify if persistent daytime sleepiness occurs even with consistent use of CPAP.   We discussed how untreated sleep apnea puts an individual at risk for cardiac arrhthymias, pulm HTN, DM, stroke and increases their risk for daytime accidents. We also briefly reviewed treatment options including weight loss, side sleeping position, oral appliance, CPAP therapy or referral to ENT for possible surgical options   Follow up in 6 weeks with Dr. Craige Cotta or Florentina Addison Bryton Waight,NP to see how CPAP changes are going, or sooner, if needed. Ok to do video visit or see in [place], if needed

## 2023-06-02 NOTE — Progress Notes (Signed)
@Patient  ID: Damon English, male    DOB: [birth year], 72 y.o.   MRN: [id number]  Chief Complaint  Patient presents with   Follow-up    Wearing cpap nightly- mask leaks occ.     Referring provider: Arlyss Queen  HPI: 72 year old male, former smoker referred for sleep consult.  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  TEST/EVENTS:  01/03/2023 HST: REI 17.7/h, 100% obstructive; SpO2 low 63%  03/06/2023: Ov with Wonder Donaway NP for sleep consult, referred by [name], PA.  He had a home sleep study in March 2024 with [facility] which revealed moderate obstructive sleep apnea.  He had been having difficulties with his sleep for many years prior to this.  He wanted to switch care over to [facility] so he asked to have sleep consult here.  He was never started on CPAP therapy after his study and has never been on CPAP in the past.  He continues to have a lot of trouble with his sleep.  Feels tired in the morning; wants to sleep all day.  Significant daytime fatigue symptoms.  Usually has dry mouth when he wakes up.  Has loud snoring.  His wife is noticed that he gasps awake and sounds like he is choking at times.  Has had some sleepiness on longer distance drives but no issues around town and has never fallen asleep while driving. Some sleep talking. No sleep walking/paralysis.  He goes to bed between 9 to 11 PM.  Falls asleep quickly.  Wakes 2 or more times a night.  Usually gets up around 9:10 AM. No sleep aids.  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  Weight is up 10 to 15 pounds over the last 2 years. He has a significant cardiac history as well as history of stroke and diabetes.  He is a  former smoker; quit 10 years ago.  Usually drinks 6-8 beers a day.  He has a past history of marijuana and cocaine use; quit 25 years ago.  No excessive caffeine intake.  Lives with his wife.  He is self-employed.  Has an adult child.  Family history of heart disease and cancer. [place] 6  06/02/2023: Today - follow up Patient presents today for follow-up after being started on CPAP therapy.  He was diagnosed with COVID mid July.  He did not wear his CPAP for about 3 weeks during this period.  Trying to get back on therapy.  Having some difficulties with mask leakage and fit.  He did switch to a DreamWear fullface mask but does not seem to be making a big difference.  Still not feeling significantly better during the day.  Feels like he is usually tired and wants to sleep during the day.  Denies any recent drowsy driving or morning headaches.  No sleep parasomnia/paralysis.  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  Is on NOAC for now.  No ASA with it. Not allergic per patient.  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  Is on NOAC for now.  No ASA with it. Not allergic per patient.   Per pt not an allergy    Immunization History  Administered Date(s) Administered   Influenza Split 08/21/2012   Influenza, Quadrivalent, Recombinant, Inj, Pf 11/08/2017, 08/08/2018, 09/09/2019   Influenza, Seasonal, Injecte, Preservative Fre 07/17/2018   Influenza,inj,Quad PF,6+ Mos 11/01/2016   Influenza,inj,quad, With Preservative  11/12/2015   Influenza-Unspecified 07/23/2013, 07/27/2021, 07/17/2022   PFIZER Comirnaty(Gray Top)Covid-19 Tri-Sucrose Vaccine 10/30/2020   PFIZER(Purple Top)SARS-COV-2 Vaccination 12/16/2019, 01/13/2020   Pneumococcal Polysaccharide-23 01/29/2019   Tdap 03/20/2006    Past Medical History:  Diagnosis Date   Alcohol use    "cutting back" but still heavy and daily, multiple shots of bourbon each night   Diabetes mellitus without complication (HCC)    History of tobacco use    reportedly quit in 2013   Hypertension    Pulmonary embolism (HCC) 2012   unprovoked   PVD (peripheral vascular disease) (HCC)     Tobacco History: Social History   Tobacco Use  Smoking Status Former   Current packs/day: 0.00   Average packs/day: 2.0 packs/day for 40.0 years (80.0 ttl pk-yrs)   Types: Cigarettes   Start date: 11/27/1970   Quit date: 11/27/2010   Years since quitting: 12.5  Smokeless Tobacco Never  Tobacco Comments   Quit in 2012   Counseling given: Not Answered Tobacco comments: Quit in 2012   Outpatient Medications Prior to Visit  Medication Sig Dispense Refill   acetaminophen (TYLENOL) 325 MG tablet Take 2 tablets (650 mg total) by mouth every 6 (six) hours as needed for fever. 20 tablet 0   apixaban (ELIQUIS) 5 MG TABS tablet Take by mouth.     atorvastatin (LIPITOR) 80 MG tablet Take 80 mg by mouth daily.     clonazePAM (KLONOPIN) 0.25 MG disintegrating tablet Take 1-2 tablets (0.25-0.5 mg total) by mouth 3 (three) times daily as needed (anxiety/agitation). 20 tablet 0   clonazePAM (KLONOPIN) 0.5 MG tablet Take 0.5 mg by mouth 2 (two) times daily as needed.     clopidogrel (PLAVIX) 75 MG tablet Take 1 tablet (75 mg total) by mouth daily. 30 tablet 0   diclofenac Sodium (VOLTAREN) 1 % GEL Apply 4 g topically 4 (four) times daily.     DULoxetine (CYMBALTA) 30 MG capsule Take 30 mg by mouth daily.     famotidine (PEPCID) 40 MG tablet Take 1 tablet by mouth every morning.      furosemide (LASIX) 20 MG tablet Take 40 mg by mouth daily as needed (Edema (Or weight gain of >2lbs overnight or 5lbs in one week)).     gabapentin (NEURONTIN) 100 MG capsule Take 1-2 capsules (100-200 mg total) by mouth 2 (two) times daily. Follow written titration schedule 120 capsule 5   glipiZIDE (GLUCOTROL) 10 MG tablet Take 5 mg by mouth as needed.     JARDIANCE 10 MG TABS tablet Take 10 mg by mouth daily.     latanoprost (XALATAN) 0.005 % ophthalmic solution Place 1 drop into both eyes at bedtime. 2.5 mL 12   losartan (COZAAR) 25 MG tablet Take 25 mg by mouth daily.     metoprolol succinate (TOPROL-XL) 25 MG 24 hr tablet Take 25 mg by mouth at bedtime.  nitroGLYCERIN (NITROSTAT) 0.4 MG SL tablet Place 1 tablet (0.4 mg total) under the tongue every 5 (five) minutes as needed for chest pain. 20 tablet 12   pantoprazole (PROTONIX) 40 MG tablet Take 40 mg by mouth daily.     polyethylene glycol (MIRALAX / GLYCOLAX) 17 g packet Take 17 g by mouth daily as needed for mild constipation or moderate constipation. 14 each 0   ranolazine (RANEXA) 500 MG 12 hr tablet Take 500 mg by mouth 2 (two) times daily.     SENNOSIDES PO Take by mouth.     spironolactone (ALDACTONE) 25 MG tablet Take 25 mg by mouth daily.     No facility-administered medications prior to visit.     Review of Systems:   Constitutional: No night sweats, fevers, chills, or lassitude. +daytime fatigue, weight gain  HEENT: No headaches, difficulty swallowing, tooth/dental problems, or sore throat. No sneezing, itching, ear ache, nasal congestion, or post nasal drip CV:  No chest pain, orthopnea, PND, swelling in lower extremities, anasarca, dizziness, palpitations, syncope Resp: +snoring, witnessed apneas; shortness of breath with exertion (baseline; stable). No excess mucus or change in color of mucus. No productive or non-productive. No hemoptysis. No wheezing.  No chest wall deformity GI:  +occasional heartburn, indigestion.  No abdominal pain, nausea, vomiting, diarrhea, change in bowel habits, loss of appetite, bloody stools.  GU: No dysuria, change in color of urine, urgency or frequency, nocturia  Skin: No rash, lesions, ulcerations. +easily bruised (on chronic AC) MSK:  +chronic joint pains. No joint swelling.   Neuro: No dizziness or lightheadedness.  Psych: +depression, anxiety (stable; baseline). No SI/HI. Mood stable. +sleep disturbance    Physical Exam:  BP 128/80 (BP Location: Left Arm, Cuff Size: Normal)   Pulse 86   Temp 97.9 F (36.6 C) (Temporal)   Ht 5\' 7"  (1.702 m)   Wt 209 lb 6.4 oz (95 kg)   SpO2 95%   BMI 32.80 kg/m   GEN: Pleasant, interactive, well-kempt; obese; in no acute distress. HEENT:  Normocephalic and atraumatic. PERRLA. Sclera white. Nasal turbinates pink, moist and patent bilaterally. No rhinorrhea present. Oropharynx pink and moist, without exudate or edema. No lesions, ulcerations, or postnasal drip. Mallampati III NECK:  Supple w/ fair ROM. No JVD present. Normal carotid impulses w/o bruits. Thyroid symmetrical with no goiter or nodules palpated. No lymphadenopathy.   CV: RRR, no m/r/g, no peripheral edema. Pulses intact, +2 bilaterally. No cyanosis, pallor or clubbing. PULMONARY:  Unlabored, regular breathing. Clear bilaterally A&P w/o wheezes/rales/rhonchi. No accessory muscle use.  GI: BS present and normoactive. Soft, non-tender to palpation. No organomegaly or masses detected. MSK: No erythema, warmth or tenderness. Cap refil <2 sec all extrem. No deformities or joint swelling noted.  Neuro: A/Ox3. No focal deficits noted.   Skin: Warm, no lesions or rashe Psych: Normal affect and behavior. Judgement and thought content appropriate.     Lab Results:  CBC    Component Value Date/Time   WBC 9.9 07/11/2021 1317   RBC 4.29 07/11/2021 1317   HGB 13.6 07/11/2021 1317   HGB 16.1 11/08/2013 1530   HCT 41.1 07/11/2021 1317   HCT 46.4 11/08/2013 1530   PLT 158  07/11/2021 1317   PLT 173 11/08/2013 1530   MCV 95.8 07/11/2021 1317   MCV 104 (H) 11/08/2013 1530   MCH 31.7 07/11/2021 1317   MCHC 33.1 07/11/2021 1317   RDW 16.1 (H) 07/11/2021 1317   RDW 12.3 11/08/2013 1530   LYMPHSABS 1.4  07/11/2021 1317   MONOABS 0.9 07/11/2021 1317   EOSABS 0.1 07/11/2021 1317   BASOSABS 0.1 07/11/2021 1317    BMET    Component Value Date/Time   NA 135 07/11/2021 1317   NA 136 11/08/2013 1530   K 4.2 07/11/2021 1317   K 3.4 (L) 11/08/2013 1530   CL 100 07/11/2021 1317   CL 102 11/08/2013 1530   CO2 21 (L) 07/11/2021 1317   CO2 27 11/08/2013 1530   GLUCOSE 139 (H) 07/11/2021 1317   GLUCOSE 150 (H) 11/08/2013 1530   BUN 30 (H) 11/04/2021 1026   BUN 16 11/08/2013 1530   CREATININE 1.53 (H) 11/04/2021 1026   CREATININE 1.07 11/08/2013 1530   CALCIUM 9.5 07/11/2021 1317   CALCIUM 9.3 11/08/2013 1530   GFRNONAA 49 (L) 11/04/2021 1026   GFRNONAA >60 11/08/2013 1530   GFRAA >60 01/30/2019 0652   GFRAA >60 11/08/2013 1530    BNP    Component Value Date/Time   BNP 218.5 (H) 07/11/2021 1317     Imaging:  No results found.  Administration History     None           No data to display          No results found for: "NITRICOXIDE"      Assessment & Plan:   Moderate obstructive sleep apnea OSA on CPAP with suboptimal compliance.  This is partially related to recent COVID illness but also having difficulties with tolerance due to leakage and mask fit.  Will send him for a mask fitting.  He is still having residual events of 10.3.  Possible this is from leakage.  Average pressure use 9.2 cmH2O.  We will tighten his pressure settings to 5 to 9 cmH2O. Encouraged to increase compliance.  Did discuss that he would probably not noticed significant benefit and less he was using it routinely.  He verbalized understanding.  Understands the risks of untreated sleep apnea.  Cautioned on safe driving practices.  Patient Instructions  Continue  CPAP every night, minimum of 4-6 hours a night.  Change equipment every 30 days or as directed by DME. Wash your tubing with warm soap and water daily, hang to dry. Wash humidifier portion weekly. You will need to change the water in your water chamber out daily with bottled, distilled water.   Adjust CPAP settings to 5-9 cmH2O Go for a mask fitting - someone will contact you to schedule this   Typical recommended timeframes for changing supplies: -Every month Mask cushions and/or nasal pillows CPAP machine filters -Every 3 months Mask frame (not including the headgear) CPAP tubing -Every 6 months Mask headgear Chin strap (if applicable) Humidifier water tub   Be aware of reduced alertness and do not drive or operate heavy machinery if experiencing this or drowsiness.  Exercise encouraged, as tolerated. Notify if persistent daytime sleepiness occurs even with consistent use of CPAP.   We discussed how untreated sleep apnea puts an individual at risk for cardiac arrhthymias, pulm HTN, DM, stroke and increases their risk for daytime accidents. We also briefly reviewed treatment options including weight loss, side sleeping position, oral appliance, CPAP therapy or referral to ENT for possible surgical options   Follow up in 6 weeks with Dr. Craige Cotta or Florentina Addison Kunta Hilleary,NP to see how CPAP changes are going, or sooner, if needed. Ok to do video visit or see in [place], if needed    Obesity (BMI 30.0-34.9) BMI 32.  Healthy weight loss encouraged.  I spent 35 minutes of dedicated to the care of this patient on the date of this encounter to include pre-visit review of records, face-to-face time with the patient discussing conditions above, post visit ordering of testing, clinical documentation with the electronic health record, making appropriate referrals as documented, and communicating necessary findings to members of the patients care team.  Noemi Chapel, NP 06/02/2023  Pt aware and  understands NP's role.

## 2023-06-02 NOTE — Assessment & Plan Note (Signed)
OSA on CPAP with suboptimal compliance.  This is partially related to recent COVID illness but also having difficulties with tolerance due to leakage and mask fit.  Will send him for a mask fitting.  He is still having residual events of 10.3.  Possible this is from leakage.  Average pressure use 9.2 cmH2O.  We will tighten his pressure settings to 5 to 9 cmH2O. Encouraged to increase compliance.  Did discuss that he would probably not noticed significant benefit and less he was using it routinely.  He verbalized understanding.  Understands the risks of untreated sleep apnea.  Cautioned on safe driving practices.  Patient Instructions  Continue CPAP every night, minimum of 4-6 hours a night.  Change equipment every 30 days or as directed by DME. Wash your tubing with warm soap and water daily, hang to dry. Wash humidifier portion weekly. You will need to change the water in your water chamber out daily with bottled, distilled water.   Adjust CPAP settings to 5-9 cmH2O Go for a mask fitting - someone will contact you to schedule this   Typical recommended timeframes for changing supplies: -Every month Mask cushions and/or nasal pillows CPAP machine filters -Every 3 months Mask frame (not including the headgear) CPAP tubing -Every 6 months Mask headgear Chin strap (if applicable) Humidifier water tub   Be aware of reduced alertness and do not drive or operate heavy machinery if experiencing this or drowsiness.  Exercise encouraged, as tolerated. Notify if persistent daytime sleepiness occurs even with consistent use of CPAP.   We discussed how untreated sleep apnea puts an individual at risk for cardiac arrhthymias, pulm HTN, DM, stroke and increases their risk for daytime accidents. We also briefly reviewed treatment options including weight loss, side sleeping position, oral appliance, CPAP therapy or referral to ENT for possible surgical options   Follow up in 6 weeks with Dr. Craige Cotta or  Florentina Addison Tagen Milby,NP to see how CPAP changes are going, or sooner, if needed. Ok to do video visit or see in [place], if needed

## 2023-06-02 NOTE — Assessment & Plan Note (Signed)
BMI 32. Healthy weight loss encouraged.

## 2023-06-08 NOTE — Progress Notes (Signed)
Reviewed and agree with assessment/plan.   Coralyn Helling, MD [facility] Pulmonary/Critical Care 06/08/2023, 9:59 AM Pager:  [phone]

## 2023-06-09 ENCOUNTER — Ambulatory Visit (INDEPENDENT_AMBULATORY_CARE_PROVIDER_SITE_OTHER): Payer: BC Managed Care – PPO

## 2023-06-09 ENCOUNTER — Ambulatory Visit (INDEPENDENT_AMBULATORY_CARE_PROVIDER_SITE_OTHER): Payer: BC Managed Care – PPO | Admitting: Vascular Surgery

## 2023-06-09 ENCOUNTER — Encounter (INDEPENDENT_AMBULATORY_CARE_PROVIDER_SITE_OTHER): Payer: Self-pay | Admitting: Vascular Surgery

## 2023-06-09 VITALS — BP 90/60 | HR 77 | Resp 18 | Ht 67.0 in | Wt 205.6 lb

## 2023-06-09 DIAGNOSIS — I10 Essential (primary) hypertension: Secondary | ICD-10-CM | POA: Diagnosis not present

## 2023-06-09 DIAGNOSIS — I70222 Atherosclerosis of native arteries of extremities with rest pain, left leg: Secondary | ICD-10-CM

## 2023-06-09 DIAGNOSIS — E1140 Type 2 diabetes mellitus with diabetic neuropathy, unspecified: Secondary | ICD-10-CM

## 2023-06-09 DIAGNOSIS — I442 Atrioventricular block, complete: Secondary | ICD-10-CM | POA: Diagnosis not present

## 2023-06-09 DIAGNOSIS — I890 Lymphedema, not elsewhere classified: Secondary | ICD-10-CM

## 2023-06-09 DIAGNOSIS — E785 Hyperlipidemia, unspecified: Secondary | ICD-10-CM

## 2023-06-09 NOTE — Assessment & Plan Note (Signed)
Right leg.  After surgical bypass at [facility] several years ago.

## 2023-06-09 NOTE — Progress Notes (Unsigned)
MRN : [id number]  Damon English is a 72 y.o. (13-Mar-1951) male who presents with chief complaint of  Chief Complaint  Patient presents with   Follow-up    f/u with abi in 6 months  .  History of Present Illness: Patient returns today in follow up of his PAD.  He is status postsurgical bypass on the right leg at [facility] several years ago.  I subsequently saw him and performed a left lower extremity intervention with extensive revascularization for rest pain.  He is currently doing well.  No rest pain, ulceration, or lifestyle limiting claudication.  He has some swelling in the right leg after development of lymphedema on the right, but it is actually better as well.  His activity level has been good. ABIs today are 1.11 on the right and 1.08 on the left with triphasic waveforms.  Current Outpatient Medications  Medication Sig Dispense Refill   acetaminophen (TYLENOL) 325 MG tablet Take 2 tablets (650 mg total) by mouth every 6 (six) hours as needed for fever. 20 tablet 0   apixaban (ELIQUIS) 5 MG TABS tablet Take by mouth.     atorvastatin (LIPITOR) 80 MG tablet Take 80 mg by mouth daily.     clonazePAM (KLONOPIN) 0.25 MG disintegrating tablet Take 1-2 tablets (0.25-0.5 mg total) by mouth 3 (three) times daily as needed (anxiety/agitation). 20 tablet 0   clonazePAM (KLONOPIN) 0.5 MG tablet Take 0.5 mg by mouth 2 (two) times daily as needed.     clopidogrel (PLAVIX) 75 MG tablet Take 1 tablet (75 mg total) by mouth daily. 30 tablet 0   diclofenac Sodium (VOLTAREN) 1 % GEL Apply 4 g topically 4 (four) times daily.     DULoxetine (CYMBALTA) 30 MG capsule Take 30 mg by mouth daily.     famotidine (PEPCID) 40 MG tablet Take 1 tablet by mouth every morning.     furosemide (LASIX) 20 MG tablet Take 40 mg by mouth daily as needed (Edema (Or weight gain of >2lbs overnight or 5lbs in one week)).     gabapentin (NEURONTIN) 100 MG capsule Take 1-2 capsules (100-200 mg total) by mouth 2 (two) times  daily. Follow written titration schedule 120 capsule 5   glipiZIDE (GLUCOTROL) 10 MG tablet Take 5 mg by mouth as needed.     JARDIANCE 10 MG TABS tablet Take 10 mg by mouth daily.     latanoprost (XALATAN) 0.005 % ophthalmic solution Place 1 drop into both eyes at bedtime. 2.5 mL 12   losartan (COZAAR) 25 MG tablet Take 25 mg by mouth daily.     metoprolol succinate (TOPROL-XL) 25 MG 24 hr tablet Take 25 mg by mouth at bedtime.     nitroGLYCERIN (NITROSTAT) 0.4 MG SL tablet Place 1 tablet (0.4 mg total) under the tongue every 5 (five) minutes as needed for chest pain. 20 tablet 12   pantoprazole (PROTONIX) 40 MG tablet Take 40 mg by mouth daily.     polyethylene glycol (MIRALAX / GLYCOLAX) 17 g packet Take 17 g by mouth daily as needed for mild constipation or moderate constipation. 14 each 0   potassium chloride (MICRO-K) 10 MEQ CR capsule Take 20 mEq by mouth daily.     ranolazine (RANEXA) 500 MG 12 hr tablet Take 500 mg by mouth 2 (two) times daily.     SENNOSIDES PO Take by mouth.     spironolactone (ALDACTONE) 25 MG tablet Take 25 mg by mouth daily.  thiamine (VITAMIN B1) 100 MG tablet Take 100 mg by mouth daily.     triamcinolone cream (KENALOG) 0.1 % APPLY TWICE DAILY TO RASH ON LEGS UP TO 2 WEEKS/MONTH AS NEEDED.     Semaglutide,0.25 or 0.5MG /DOS, 2 MG/3ML SOPN Inject into the skin. (Patient not taking: Reported on 06/09/2023)     No current facility-administered medications for this visit.    Past Medical History:  Diagnosis Date   Alcohol use    "cutting back" but still heavy and daily, multiple shots of bourbon each night   Diabetes mellitus without complication (HCC)    History of tobacco use    reportedly quit in 2013   Hypertension    Pulmonary embolism (HCC) 2012   unprovoked   PVD (peripheral vascular disease) (HCC)     Past Surgical History:  Procedure Laterality Date   BACK SURGERY     CHOLECYSTECTOMY     CORONARY ANGIOGRAPHY N/A 01/30/2019   Procedure:  CORONARY ANGIOGRAPHY;  Surgeon: Lamar Blinks, MD;  Location: [facility] INVASIVE CV LAB;  Service: Cardiovascular;  Laterality: N/A;   ERCP W/ SPHICTEROTOMY  2015   FEMORAL ARTERY - POPLITEAL ARTERY BYPASS GRAFT Right 06/19/2018   LEFT HEART CATH AND CORS/GRAFTS ANGIOGRAPHY N/A 06/25/2021   Procedure: LEFT HEART CATH AND CORS/GRAFTS ANGIOGRAPHY;  Surgeon: Armando Reichert, MD;  Location: [facility] INVASIVE CV LAB;  Service: Cardiovascular;  Laterality: N/A;   LOWER EXTREMITY ANGIOGRAPHY Left 01/14/2021   Procedure: LOWER EXTREMITY ANGIOGRAPHY;  Surgeon: Annice Needy, MD;  Location: [facility] INVASIVE CV LAB;  Service: Cardiovascular;  Laterality: Left;   LOWER EXTREMITY ANGIOGRAPHY Left 11/04/2021   Procedure: LOWER EXTREMITY ANGIOGRAPHY;  Surgeon: Annice Needy, MD;  Location: [facility] INVASIVE CV LAB;  Service: Cardiovascular;  Laterality: Left;   TEMPORARY PACEMAKER N/A 07/11/2021   Procedure: TEMPORARY PACEMAKER;  Surgeon: Iran Ouch, MD;  Location: [facility] INVASIVE CV LAB;  Service: Cardiovascular;  Laterality: N/A;   THROMBOENDARTERECTOMY Right 06/19/2018   TONSILLECTOMY     VASCULAR SURGERY       Social History   Tobacco Use   Smoking status: Former    Current packs/day: 0.00    Average packs/day: 2.0 packs/day for 40.0 years (80.0 ttl pk-yrs)    Types: Cigarettes    Start date: 11/27/1970    Quit date: 11/27/2010    Years since quitting: 12.5   Smokeless tobacco: Never   Tobacco comments:    Quit in 2012  Vaping Use   Vaping status: Never Used  Substance Use Topics   Alcohol use: Yes    Alcohol/week: 7.0 standard drinks of alcohol    Types: 7 Shots of liquor per week    Comment: stopped alcohol 06/17/2021   Drug use: Never      Family History  Problem Relation Age of Onset   Hypertension Mother    Parkinson's disease Mother    Heart attack Father    Vascular Disease Father    AAA (abdominal aortic aneurysm) Father    Alzheimer's disease Father      Allergies  Allergen  Reactions   Amoxicillin Other (See Comments)    Abdominal cramps Other reaction(s): Other (See Comments) Abdominal cramps Tolerated ancef 7/16   Hydromorphone Hcl     Quick & severe nausea/vomiting  Other reaction(s): Vomiting Quick & severe nausea/vomiting    Aspirin Other (See Comments)    Other reaction(s): Other (See Comments) 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  Is on NOAC for now.  No ASA  with it. Not allergic per patient.  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  Is on NOAC for now.  No ASA with it. Not allergic per patient.   Per pt not an allergy    REVIEW OF SYSTEMS (Negative unless checked)   Constitutional: [] Weight loss  [] Fever  [] Chills Cardiac: [] Chest pain   [] Chest pressure   [x] Palpitations   [] Shortness of breath when laying flat   [] Shortness of breath at rest   [x] Shortness of breath with exertion. Vascular:  [x] Pain in legs with walking   [] Pain in legs at rest   [] Pain in legs when laying flat   [] Claudication   [] Pain in feet when walking  [] Pain in feet at rest  [] Pain in feet when laying flat   [] History of DVT   [] Phlebitis   [] Swelling in legs   [] Varicose veins   [] Non-healing ulcers Pulmonary:   [] Uses home oxygen   [] Productive cough   [] Hemoptysis   [] Wheeze  [] COPD   [] Asthma Neurologic:  [] Dizziness  [] Blackouts   [] Seizures   [] History of stroke   [] History of TIA  [] Aphasia   [] Temporary blindness   [] Dysphagia   [] Weakness or numbness in arms   [] Weakness or numbness in legs Musculoskeletal:  [x] Arthritis   [] Joint swelling   [x] Joint pain   [] Low back pain Hematologic:  [] Easy bruising  [] Easy bleeding   [] Hypercoagulable state   [] Anemic   Gastrointestinal:  [] Blood in stool   [] Vomiting blood  [] Gastroesophageal reflux/heartburn   [] Abdominal pain Genitourinary:  [] Chronic kidney disease   [] Difficult urination  [] Frequent urination  [] Burning with urination   [] Hematuria Skin:  [] Rashes   [] Ulcers   [] Wounds Psychological:  [] History of anxiety   []  History  of major depression.  Physical Examination  BP 90/60 (BP Location: Right Arm)   Pulse 77   Resp 18   Ht 5\' 7"  (1.702 m)   Wt 205 lb 9.6 oz (93.3 kg)   BMI 32.20 kg/m  Gen:  WD/WN, NAD Head: [place]/AT, No temporalis wasting. Ear/Nose/Throat: Hearing grossly intact, nares w/o erythema or drainage Eyes: Conjunctiva clear. Sclera non-icteric Neck: Supple.  Trachea midline Pulmonary:  Good air movement, no use of accessory muscles.  Cardiac: irregular Vascular:  Vessel Right Left  Radial Palpable Palpable                          PT 1+ Palpable 2+ Palpable  DP 1+ Palpable 1+ Palpable   Gastrointestinal: soft, non-tender/non-distended. No guarding/reflex.  Musculoskeletal: M/S 5/5 throughout.  No deformity or atrophy. 1+ RLE edema. Neurologic: Sensation grossly intact in extremities.  Symmetrical.  Speech is fluent.  Psychiatric: Judgment intact, Mood & affect appropriate for pt's clinical situation. Dermatologic: No rashes or ulcers noted.  No cellulitis or open wounds.      Labs No results found for this or any previous visit (from the past 2160 hour(s)).  Radiology No results found.  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 Continue statin therapy     Type 2 diabetes mellitus (HCC) blood glucose control important in reducing the progression of atherosclerotic disease. Also, involved in wound healing. On appropriate medications.     HTN (hypertension) blood pressure control important in reducing the progression of atherosclerotic disease. On appropriate oral medications.  Lymphedema Right leg.  After surgical bypass at [facility] several years ago.  Atherosclerosis of native  arteries of extremity with rest pain (HCC) ABIs today are 1.11 on the right and 1.08 on the left with triphasic waveforms.  Doing well status post surgical bypass on the right leg  at another institution and endovascular revascularization on the left lower extremity by me a couple years ago.  Does have some swelling on the right leg but it is actually better as well.  Overall doing well.  Continue current medical regimen.  Recheck in 6 months.    Festus Barren, MD  06/12/2023 8:37 AM    This note was created with Dragon medical transcription system.  Any errors from dictation are purely unintentional

## 2023-06-11 DIAGNOSIS — G4733 Obstructive sleep apnea (adult) (pediatric): Secondary | ICD-10-CM | POA: Diagnosis not present

## 2023-06-12 DIAGNOSIS — L600 Ingrowing nail: Secondary | ICD-10-CM | POA: Diagnosis not present

## 2023-06-12 DIAGNOSIS — B351 Tinea unguium: Secondary | ICD-10-CM | POA: Diagnosis not present

## 2023-06-12 DIAGNOSIS — E1159 Type 2 diabetes mellitus with other circulatory complications: Secondary | ICD-10-CM | POA: Diagnosis not present

## 2023-06-12 NOTE — Assessment & Plan Note (Signed)
ABIs today are 1.11 on the right and 1.08 on the left with triphasic waveforms.  Doing well status post surgical bypass on the right leg at another institution and endovascular revascularization on the left lower extremity by me a couple years ago.  Does have some swelling on the right leg but it is actually better as well.  Overall doing well.  Continue current medical regimen.  Recheck in 6 months.

## 2023-06-20 ENCOUNTER — Other Ambulatory Visit (HOSPITAL_BASED_OUTPATIENT_CLINIC_OR_DEPARTMENT_OTHER): Payer: BC Managed Care – PPO

## 2023-06-20 LAB — VAS US ABI WITH/WO TBI
Left ABI: 1.08
Right ABI: 1.11

## 2023-07-12 DIAGNOSIS — G4733 Obstructive sleep apnea (adult) (pediatric): Secondary | ICD-10-CM | POA: Diagnosis not present

## 2023-07-14 ENCOUNTER — Telehealth: Payer: BC Managed Care – PPO | Admitting: Nurse Practitioner

## 2023-07-19 DIAGNOSIS — I5022 Chronic systolic (congestive) heart failure: Secondary | ICD-10-CM | POA: Diagnosis not present

## 2023-07-19 DIAGNOSIS — I4901 Ventricular fibrillation: Secondary | ICD-10-CM | POA: Diagnosis not present

## 2023-07-19 DIAGNOSIS — I10 Essential (primary) hypertension: Secondary | ICD-10-CM | POA: Diagnosis not present

## 2023-07-19 DIAGNOSIS — Z7901 Long term (current) use of anticoagulants: Secondary | ICD-10-CM | POA: Diagnosis not present

## 2023-07-21 DIAGNOSIS — I2510 Atherosclerotic heart disease of native coronary artery without angina pectoris: Secondary | ICD-10-CM | POA: Diagnosis not present

## 2023-07-21 DIAGNOSIS — I739 Peripheral vascular disease, unspecified: Secondary | ICD-10-CM | POA: Diagnosis not present

## 2023-07-21 DIAGNOSIS — Z86718 Personal history of other venous thrombosis and embolism: Secondary | ICD-10-CM | POA: Diagnosis not present

## 2023-07-21 DIAGNOSIS — Z79899 Other long term (current) drug therapy: Secondary | ICD-10-CM | POA: Diagnosis not present

## 2023-07-21 DIAGNOSIS — E119 Type 2 diabetes mellitus without complications: Secondary | ICD-10-CM | POA: Diagnosis not present

## 2023-07-26 DIAGNOSIS — I6930 Unspecified sequelae of cerebral infarction: Secondary | ICD-10-CM | POA: Diagnosis not present

## 2023-07-26 DIAGNOSIS — R7983 Abnormal findings of blood amino-acid level: Secondary | ICD-10-CM | POA: Diagnosis not present

## 2023-07-26 DIAGNOSIS — E119 Type 2 diabetes mellitus without complications: Secondary | ICD-10-CM | POA: Diagnosis not present

## 2023-07-26 DIAGNOSIS — N182 Chronic kidney disease, stage 2 (mild): Secondary | ICD-10-CM | POA: Diagnosis not present

## 2023-08-18 ENCOUNTER — Telehealth: Payer: BC Managed Care – PPO | Admitting: Nurse Practitioner

## 2023-08-18 ENCOUNTER — Encounter: Payer: Self-pay | Admitting: Nurse Practitioner

## 2023-08-18 DIAGNOSIS — G4733 Obstructive sleep apnea (adult) (pediatric): Secondary | ICD-10-CM | POA: Diagnosis not present

## 2023-08-18 DIAGNOSIS — I25118 Atherosclerotic heart disease of native coronary artery with other forms of angina pectoris: Secondary | ICD-10-CM

## 2023-08-18 NOTE — Progress Notes (Signed)
Patient ID: Damon English, male     DOB: 1950-12-19, 72 y.o.      MRN: [id number]  No chief complaint on file.   Virtual Visit via Video Note  I connected with Damon English on 08/18/23 at  2:30 PM EDT by a video enabled telemedicine application and verified that I am speaking with the correct person using two identifiers.  Location: Patient: Home Provider: Office   I discussed the limitations of evaluation and management by telemedicine and the availability of in person appointments. The patient expressed understanding and agreed to proceed.  History of Present Illness: 72 year old male, former smoker followed for OSA on CPAP.  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   TEST/EVENTS:  01/03/2023 HST: REI 17.7/h, 100% obstructive; SpO2 low 63%   03/06/2023: Ov with Damon Ruggieri NP for sleep consult, referred by Damon Peak, PA.  He had a home sleep study in March 2024 with [facility] which revealed moderate obstructive sleep apnea.  He had been having difficulties with his sleep for many years prior to this.  He wanted to switch care over to [facility] so he asked to have sleep consult here.  He was never started on CPAP therapy after his study and has never been on CPAP in the past.  He continues to have a lot of trouble with his sleep.  Feels tired in the morning; wants to sleep all day.  Significant daytime fatigue symptoms.  Usually has dry mouth when he wakes up.  Has loud snoring.  His wife is noticed that he gasps awake and sounds like he is choking at times.  Has had some sleepiness on longer distance drives but no issues around town and has never fallen asleep while driving. Some sleep talking. No sleep  walking/paralysis.  He goes to bed between 9 to 11 PM.  Falls asleep quickly.  Wakes 2 or more times a night.  Usually gets up around 9:10 AM. No sleep aids.  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  Weight is up 10 to 15 pounds over the last 2 years. He has a significant cardiac history as well as history of stroke and diabetes.  He is a former smoker; quit 10 years ago.  Usually drinks 6-8 beers a day.  He has a past history of marijuana and cocaine use; quit 25 years ago.  No excessive caffeine intake.  Lives with his wife.  He is self-employed.  Has an adult child.  Family history of heart disease and cancer. [place] 6   06/02/2023: OV with Damon Keough NP for follow-up after being started on CPAP therapy.  He was diagnosed with COVID mid July.  He did not wear his CPAP for about 3 weeks during this period.  Trying to get back on therapy.  Having some difficulties with mask leakage and fit.  He did switch to a DreamWear fullface mask but does not seem to be making a big difference.  Still not feeling significantly better during the day.  Feels like he is usually tired and wants to sleep during the day.  Denies any recent drowsy driving or morning headaches.  No sleep parasomnia/paralysis. 05/02/2023-05/31/2023 CPAP 5-15 cmH2O 12/30 days; 30% >4 hr; average usage  5 hours 54 minutes Pressure 95th 9.2 Leaks 95th 58.3 AHI 10.3  08/18/2023: Today - follow up Patient presents today for follow-up via video visit.  We made adjustments to his CPAP after his last visit due to significant leaks.  He was also not feeling much benefit during the day.  We brought him down to 5-9 cmH2O from 5 to 15 cm water.  He also switched over to an [place] fullface mask, which he is liking.  He actually is feeling better during the day.  Feels like his energy levels have improved.  He has been wearing the CPAP every night.  Does occasionally wake up and has noticed that he is taken it off but this does not happen very  often.  Denies any issues with drowsy driving or morning headaches.   He is having some cardiac issues with angina and dyspnea on exertion, which he is working with his cardiologist on.  No active chest pain. Understands that if symptoms worsen he should go to the emergency room.  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  Is on NOAC for now.  No ASA with it. Not allergic per patient.  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  Is on NOAC for now.  No ASA with it. Not allergic per patient.   Per pt not an allergy   Immunization History  Administered Date(s) Administered   Influenza Split 08/21/2012   Influenza, Quadrivalent, Recombinant, Inj, Pf 11/08/2017, 08/08/2018, 09/09/2019   Influenza, Seasonal, Injecte, Preservative Fre 07/17/2018   Influenza,inj,Quad PF,6+ Mos 11/01/2016   Influenza,inj,quad, With Preservative 11/12/2015   Influenza-Unspecified 07/23/2013, 07/27/2021, 07/17/2022   PFIZER Comirnaty(Gray Top)Covid-19 Tri-Sucrose Vaccine 10/30/2020   PFIZER(Purple Top)SARS-COV-2 Vaccination 12/16/2019, 01/13/2020   Pneumococcal Polysaccharide-23 01/29/2019   Tdap 03/20/2006   Past Medical History:  Diagnosis Date   Alcohol use    "cutting back" but still heavy and daily, multiple shots of bourbon each night   Diabetes mellitus without complication (HCC)    History of tobacco use    reportedly quit in 2013   Hypertension    Pulmonary embolism (HCC) 2012   unprovoked   PVD (peripheral vascular  disease) (HCC)     Tobacco History: Social History   Tobacco Use  Smoking Status Former   Current packs/day: 0.00   Average packs/day: 2.0 packs/day for 40.0 years (80.0 ttl pk-yrs)   Types: Cigarettes   Start date: 11/27/1970   Quit date: 11/27/2010   Years since quitting: 12.7  Smokeless Tobacco Never  Tobacco Comments   Quit in 2012   Counseling given: Not Answered Tobacco comments: Quit in 2012   Outpatient Medications Prior to Visit  Medication Sig Dispense Refill   acetaminophen (TYLENOL) 325 MG tablet Take 2 tablets (650 mg total) by mouth every 6 (six) hours as needed for fever. 20 tablet 0   apixaban (ELIQUIS) 5 MG TABS tablet Take by mouth.     atorvastatin (LIPITOR) 80 MG tablet Take 80 mg by mouth daily.  clonazePAM (KLONOPIN) 0.25 MG disintegrating tablet Take 1-2 tablets (0.25-0.5 mg total) by mouth 3 (three) times daily as needed (anxiety/agitation). 20 tablet 0   clonazePAM (KLONOPIN) 0.5 MG tablet Take 0.5 mg by mouth 2 (two) times daily as needed.     clopidogrel (PLAVIX) 75 MG tablet Take 1 tablet (75 mg total) by mouth daily. 30 tablet 0   diclofenac Sodium (VOLTAREN) 1 % GEL Apply 4 g topically 4 (four) times daily.     DULoxetine (CYMBALTA) 30 MG capsule Take 30 mg by mouth daily.     famotidine (PEPCID) 40 MG tablet Take 1 tablet by mouth every morning.     furosemide (LASIX) 20 MG tablet Take 40 mg by mouth daily as needed (Edema (Or weight gain of >2lbs overnight or 5lbs in one week)).     gabapentin (NEURONTIN) 100 MG capsule Take 1-2 capsules (100-200 mg total) by mouth 2 (two) times daily. Follow written titration schedule 120 capsule 5   glipiZIDE (GLUCOTROL) 10 MG tablet Take 5 mg by mouth as needed.     JARDIANCE 10 MG TABS tablet Take 10 mg by mouth daily.     latanoprost (XALATAN) 0.005 % ophthalmic solution Place 1 drop into both eyes at bedtime. 2.5 mL 12   losartan (COZAAR) 25 MG tablet Take 25 mg by mouth daily.     metoprolol succinate  (TOPROL-XL) 25 MG 24 hr tablet Take 25 mg by mouth at bedtime.     nitroGLYCERIN (NITROSTAT) 0.4 MG SL tablet Place 1 tablet (0.4 mg total) under the tongue every 5 (five) minutes as needed for chest pain. 20 tablet 12   pantoprazole (PROTONIX) 40 MG tablet Take 40 mg by mouth daily.     polyethylene glycol (MIRALAX / GLYCOLAX) 17 g packet Take 17 g by mouth daily as needed for mild constipation or moderate constipation. 14 each 0   potassium chloride (MICRO-K) 10 MEQ CR capsule Take 20 mEq by mouth daily.     ranolazine (RANEXA) 500 MG 12 hr tablet Take 500 mg by mouth 2 (two) times daily.     Semaglutide,0.25 or 0.5MG /DOS, 2 MG/3ML SOPN Inject into the skin. (Patient not taking: Reported on 06/09/2023)     SENNOSIDES PO Take by mouth.     spironolactone (ALDACTONE) 25 MG tablet Take 25 mg by mouth daily.     thiamine (VITAMIN B1) 100 MG tablet Take 100 mg by mouth daily.     triamcinolone cream (KENALOG) 0.1 % APPLY TWICE DAILY TO RASH ON LEGS UP TO 2 WEEKS/MONTH AS NEEDED.     No facility-administered medications prior to visit.     Review of Systems:   Constitutional: No night sweats, fevers, chills, or lassitude. +daytime fatigue (improved), weight gain (stabilized) HEENT: No headaches, difficulty swallowing, tooth/dental problems, or sore throat. No sneezing, itching, ear ache, nasal congestion, or post nasal drip CV:  +intermittent angina (working with cardiology). No orthopnea, PND, swelling in lower extremities, anasarca, dizziness, palpitations, syncope Resp: +snoring, witnessed apneas (resolves with CPAP); shortness of breath with exertion (slight increase). No excess mucus or change in color of mucus. No productive or non-productive. No hemoptysis. No wheezing.  No chest wall deformity GI:  +occasional heartburn, indigestion (baseline). No abdominal pain, nausea, vomiting, diarrhea, change in bowel habits, loss of appetite, bloody stools.  GU: No dysuria, change in color of urine,  urgency or frequency, nocturia  Skin: No rash, lesions, ulcerations. +easily bruised (on chronic AC) MSK:  +chronic joint pains. No  joint swelling.   Neuro: No dizziness or lightheadedness.  Psych: +depression, anxiety (stable; baseline). No SI/HI. Mood stable. +sleep disturbance (improves with CPAP)  Observations/Objective: Patient is well-developed, well-nourished in no acute distress. A&Ox3. Resting comfortably at home. Unlabored breathing.  Speech is clear and coherent with logical content.   Assessment and Plan: Moderate obstructive sleep apnea Moderate OSA on CPAP. He had suboptimal compliance and large leaks at his last visit so we adjusted settings to 5-9 cmH2O given average pressure of 9.2cmH2O. He has excellent compliance on download today. Unfortunately continues to have breakthrough events, up to almost 40/h some nights, which are not directly correlated to leaks. Given difficulties controlling him and previous mask intolerance, will have him undergo CPAP titration in the lab. He is receiving benefit from use. Understands proper use/care of device. Aware of risks of untreated OSA. Safe driving practices reviewed.  Patient Instructions  Continue to use CPAP every night, minimum of 4-6 hours a night.  Change equipment as directed. Wash your tubing with warm soap and water daily, hang to dry. Wash humidifier portion weekly. Use bottled, distilled water and change daily Be aware of reduced alertness and do not drive or operate heavy machinery if experiencing this or drowsiness.  Exercise encouraged, as tolerated. Healthy weight management discussed.  Avoid or decrease alcohol consumption and medications that make you more sleepy, if possible. Notify if persistent daytime sleepiness occurs even with consistent use of PAP therapy.  You are having a lot of breakthrough sleep apnea events on your download, which are not directly correlated to leaks. I believe the next best step is to put  you in the lab for CPAP titration study to determine appropriate settings and best mask to control you. Someone should contact you for scheduling. Let me know if you haven't heard something in the next 2 weeks.  Keep working with your heart doctor. If you have worsening symptoms, go to the emergency department   Follow up in 6 weeks after CPAP titration with Dr. Belia Heman or Katie Berenize Gatlin,NP. If symptoms do not improve or worsen, please contact office for sooner follow up or seek emergency care.     CAD (coronary artery disease) Significant cardiac history. ED precautions advised. No active CP. Follow up with cardiology as scheduled.    I discussed the assessment and treatment plan with the patient. The patient was provided an opportunity to ask questions and all were answered. The patient agreed with the plan and demonstrated an understanding of the instructions.   The patient was advised to call back or seek an in-person evaluation if the symptoms worsen or if the condition fails to improve as anticipated.  I provided 32 minutes of non-face-to-face time during this encounter.   Noemi Chapel, NP

## 2023-08-18 NOTE — Assessment & Plan Note (Signed)
Significant cardiac history. ED precautions advised. No active CP. Follow up with cardiology as scheduled.

## 2023-08-18 NOTE — Patient Instructions (Addendum)
Continue to use CPAP every night, minimum of 4-6 hours a night.  Change equipment as directed. Wash your tubing with warm soap and water daily, hang to dry. Wash humidifier portion weekly. Use bottled, distilled water and change daily Be aware of reduced alertness and do not drive or operate heavy machinery if experiencing this or drowsiness.  Exercise encouraged, as tolerated. Healthy weight management discussed.  Avoid or decrease alcohol consumption and medications that make you more sleepy, if possible. Notify if persistent daytime sleepiness occurs even with consistent use of PAP therapy.  You are having a lot of breakthrough sleep apnea events on your download, which are not directly correlated to leaks. I believe the next best step is to put you in the lab for CPAP titration study to determine appropriate settings and best mask to control you. Someone should contact you for scheduling. Let me know if you haven't heard something in the next 2 weeks.  Keep working with your heart doctor. If you have worsening symptoms, go to the emergency department   Follow up in 6 weeks after CPAP titration with Dr. Belia Heman or Katie Ryheem Jay,NP. If symptoms do not improve or worsen, please contact office for sooner follow up or seek emergency care.

## 2023-08-18 NOTE — Assessment & Plan Note (Signed)
Moderate OSA on CPAP. He had suboptimal compliance and large leaks at his last visit so we adjusted settings to 5-9 cmH2O given average pressure of 9.2cmH2O. He has excellent compliance on download today. Unfortunately continues to have breakthrough events, up to almost 40/h some nights, which are not directly correlated to leaks. Given difficulties controlling him and previous mask intolerance, will have him undergo CPAP titration in the lab. He is receiving benefit from use. Understands proper use/care of device. Aware of risks of untreated OSA. Safe driving practices reviewed.  Patient Instructions  Continue to use CPAP every night, minimum of 4-6 hours a night.  Change equipment as directed. Wash your tubing with warm soap and water daily, hang to dry. Wash humidifier portion weekly. Use bottled, distilled water and change daily Be aware of reduced alertness and do not drive or operate heavy machinery if experiencing this or drowsiness.  Exercise encouraged, as tolerated. Healthy weight management discussed.  Avoid or decrease alcohol consumption and medications that make you more sleepy, if possible. Notify if persistent daytime sleepiness occurs even with consistent use of PAP therapy.  You are having a lot of breakthrough sleep apnea events on your download, which are not directly correlated to leaks. I believe the next best step is to put you in the lab for CPAP titration study to determine appropriate settings and best mask to control you. Someone should contact you for scheduling. Let me know if you haven't heard something in the next 2 weeks.  Keep working with your heart doctor. If you have worsening symptoms, go to the emergency department   Follow up in 6 weeks after CPAP titration with Dr. Belia Heman or Katie Yatziri Wainwright,NP. If symptoms do not improve or worsen, please contact office for sooner follow up or seek emergency care.

## 2023-08-21 DIAGNOSIS — G4733 Obstructive sleep apnea (adult) (pediatric): Secondary | ICD-10-CM | POA: Diagnosis not present

## 2023-08-24 ENCOUNTER — Telehealth: Payer: Self-pay | Admitting: Nurse Practitioner

## 2023-08-24 NOTE — Telephone Encounter (Signed)
Patient would like for his sleep study to be done in [place] at [facility] at [facility]. It is currently scheduled for November 20th,2024 in [place]. He also has one scheduled at [facility] for this Saturday,November 9th. He needs to know if he should cancel his [facility] appointment.    Call back at [phone]  or  [phone]

## 2023-08-26 DIAGNOSIS — G4733 Obstructive sleep apnea (adult) (pediatric): Secondary | ICD-10-CM | POA: Diagnosis not present

## 2023-08-29 DIAGNOSIS — I5022 Chronic systolic (congestive) heart failure: Secondary | ICD-10-CM | POA: Diagnosis not present

## 2023-08-29 DIAGNOSIS — F1010 Alcohol abuse, uncomplicated: Secondary | ICD-10-CM | POA: Diagnosis not present

## 2023-08-29 DIAGNOSIS — I25119 Atherosclerotic heart disease of native coronary artery with unspecified angina pectoris: Secondary | ICD-10-CM | POA: Diagnosis not present

## 2023-08-29 DIAGNOSIS — I10 Essential (primary) hypertension: Secondary | ICD-10-CM | POA: Diagnosis not present

## 2023-09-06 ENCOUNTER — Encounter (HOSPITAL_BASED_OUTPATIENT_CLINIC_OR_DEPARTMENT_OTHER): Payer: BC Managed Care – PPO | Admitting: Internal Medicine

## 2023-09-07 NOTE — Telephone Encounter (Signed)
[facility] is Radiographer, therapeutic. They received the referral but only half of the fax came through. They still need the rest of the fax and his previous sleep study. Fax number [phone]

## 2023-09-19 NOTE — Telephone Encounter (Signed)
Amy from [facility] needs previous sleep study and additional clinical notes. Amy phone number is [phone].

## 2023-09-20 NOTE — Telephone Encounter (Signed)
I have spoke with Cindi with [facility] and she explained they only got part of the fax. I have now refaxed the order with notes and sleep study

## 2023-09-26 ENCOUNTER — Telehealth: Payer: Self-pay | Admitting: Nurse Practitioner

## 2023-09-26 NOTE — Telephone Encounter (Signed)
Pt is currently wanting to speak with Dr. To go over his results

## 2023-10-02 DIAGNOSIS — G4733 Obstructive sleep apnea (adult) (pediatric): Secondary | ICD-10-CM

## 2023-10-02 NOTE — Telephone Encounter (Signed)
Per Rubye Oaks, NP via epic secure chat- order placed to adjust pressure and Paykel Evora full face mask . Marland Kitchen Patient is aware and voiced his understanding. He stated that he has study at [facility], as his wife works there and most of his providers are there.  Nothing further needed.

## 2023-10-02 NOTE — Telephone Encounter (Signed)
See patient patient message from today (12/16).  Closing this encounter.

## 2023-10-02 NOTE — Telephone Encounter (Signed)
Cobb NP ordered a CPAP titration study, can we check on status of this ?

## 2023-10-02 NOTE — Telephone Encounter (Signed)
Please see below message. They are unsure if the pressure has been increased or what is going on at all anymore. The patient is no longer wearing the new mask as it is causing mouth sores. Sleep apnea is worsening. Saw cardiologist and they said that his heart failure is getting worse. Marking as urgent as they are incredibly frustrated and very concerned about everything.

## 2023-10-02 NOTE — Telephone Encounter (Signed)
CPAP titration study in care everywhere shows    CPAP titration demonstrated improvement in the control of the  patient's respiratory events.  2) Recommend a trial of autoCPAP pressure of 11 - 15 cwp with  heated humidification for home PAP therapy. Close clinical follow  up using PAP data download is recommended.  3) During this study, the patient utilized a small/medium [employer] Evora full face mask .   I will send order to DME for this pressure setting. Needs to wear all night long. Please make sure he following up with Cobb or his MD as recommended.

## 2023-10-02 NOTE — Telephone Encounter (Signed)
Sleep see CPAP Titration study from 08/26/2023 at [facility].   Damon English is out of the office. Tammy can you advise?

## 2023-10-02 NOTE — Telephone Encounter (Signed)
Please see below message. They are unsure if the pressure has been increased or what is going on at all anymore. The patient is no longer wearing the new mask as it is causing mouth sores. Sleep apnea is worsening. Saw cardiologist and they said that his heart failure is getting worse. Marking as urgent as they are incredibly frustrated and very concerned about everything.      Janet Berlin Wierman to [facility] (supporting Micheline Maze V, NP)      10/02/23  8:36 AM Hi Natalia Leatherwood,   We have tried contacting you several times regarding Brewster's sleep study, with no luck. Have left several messages (the last one being last week). This is becoming urgent as his cardiologist's recommendations are contingent on this follow up.  Per the last report from [facility], it was recommended to increase his pressure as his current pressure was not adequate. It was also your recommendation he use a different face mask as the other type he was using caused leaks. He was using the new mask, however, this is causing mouth sores so he discontinued use of the machine.    An appointment was made with [facility] but not until April!     Our concerns are: 1. He had to stop using the machine because the mask you recommended is causing mouth sores 2. His sleep apnea is extremely bad and his cardiologist is not sure if his increased CFH is due to his increased sleep apnea or the increased sleep apnea is due to his increased CHF.  3. The report recommends increasing the pressure and we aren't sure if this was done remotely, but he can't wear the mask (which does provide less leaks) because of mouth sores).    This is becoming very frustrating as we are getting no response from anyone!  And it's becoming  critical to his health as he is not able to wear the mask. He cannot wait until April to see this dr at [facility].    Please contact us asap a provide recommendations.  Steward Drone [phone] Matheu Cagnina [phone] Or through  my chart if we are not available via phone.    Best, Steward Drone and Jillyn Hidden

## 2023-10-18 DIAGNOSIS — I4901 Ventricular fibrillation: Secondary | ICD-10-CM | POA: Diagnosis not present

## 2023-10-18 DIAGNOSIS — Z7901 Long term (current) use of anticoagulants: Secondary | ICD-10-CM | POA: Diagnosis not present

## 2023-10-18 DIAGNOSIS — I5022 Chronic systolic (congestive) heart failure: Secondary | ICD-10-CM | POA: Diagnosis not present

## 2023-10-18 DIAGNOSIS — I110 Hypertensive heart disease with heart failure: Secondary | ICD-10-CM | POA: Diagnosis not present

## 2023-10-25 ENCOUNTER — Ambulatory Visit
Admission: RE | Admit: 2023-10-25 | Payer: BC Managed Care – PPO | Source: Ambulatory Visit | Admitting: Internal Medicine

## 2023-10-25 SURGERY — COLONOSCOPY WITH PROPOFOL
Anesthesia: General

## 2023-10-31 DIAGNOSIS — E119 Type 2 diabetes mellitus without complications: Secondary | ICD-10-CM | POA: Diagnosis not present

## 2023-10-31 DIAGNOSIS — Z86718 Personal history of other venous thrombosis and embolism: Secondary | ICD-10-CM | POA: Diagnosis not present

## 2023-10-31 DIAGNOSIS — I2510 Atherosclerotic heart disease of native coronary artery without angina pectoris: Secondary | ICD-10-CM | POA: Diagnosis not present

## 2023-10-31 DIAGNOSIS — Z23 Encounter for immunization: Secondary | ICD-10-CM | POA: Diagnosis not present

## 2023-10-31 DIAGNOSIS — Z1331 Encounter for screening for depression: Secondary | ICD-10-CM | POA: Diagnosis not present

## 2023-10-31 DIAGNOSIS — I739 Peripheral vascular disease, unspecified: Secondary | ICD-10-CM | POA: Diagnosis not present

## 2023-11-16 ENCOUNTER — Telehealth: Payer: Self-pay

## 2023-11-16 NOTE — Telephone Encounter (Signed)
Noted.  Will close encounter.

## 2023-11-16 NOTE — Telephone Encounter (Signed)
Lm for patient to ask if e completed cpap titration.

## 2023-11-16 NOTE — Telephone Encounter (Signed)
Pt's wife said titration was done thru [facility]. States they have seen Florentina Addison since this was done.

## 2023-11-17 ENCOUNTER — Encounter: Payer: Self-pay | Admitting: Nurse Practitioner

## 2023-11-17 ENCOUNTER — Ambulatory Visit: Payer: BC Managed Care – PPO | Admitting: Nurse Practitioner

## 2023-11-17 VITALS — BP 106/60 | HR 81 | Temp 97.8°F | Resp 16 | Ht 67.0 in | Wt 215.6 lb

## 2023-11-17 DIAGNOSIS — G4733 Obstructive sleep apnea (adult) (pediatric): Secondary | ICD-10-CM | POA: Diagnosis not present

## 2023-11-17 DIAGNOSIS — K120 Recurrent oral aphthae: Secondary | ICD-10-CM | POA: Diagnosis not present

## 2023-11-17 MED ORDER — TRIAMCINOLONE ACETONIDE 0.1 % MT PSTE
1.0000 | PASTE | Freq: Two times a day (BID) | OROMUCOSAL | 0 refills | Status: AC
Start: 2023-11-17 — End: ?

## 2023-11-17 NOTE — Assessment & Plan Note (Signed)
Moderate OSA.  Difficulties tolerating CPAP as well as controlling his apnea.  He underwent CPAP titration study and adjusted his settings to 11 to 15 cm water.  He has significant improvement in residual AHI.  Decreased usage primarily related to ulceration and discomfort from current mask.  He has reached out to his medical supply company but has not had any success with changing masks.  Advised that he switch to a regular fullface mask.  Orders placed for AirFit F20.  He was also provided with a prescription that he can take to the DME company in case he has any further difficulties. Aware of risks of untreated OSA. Encouraged to increase usage once new mask received. Settings appear appropriate and controlling him well. Understands proper care/use. Reassess at follow up. Continue healthy weight loss measures. Safe driving practices.  Patient Instructions  Continue to use CPAP every night, minimum of 4-6 hours a night.  Change equipment as directed. Wash your tubing with warm soap and water daily, hang to dry. Wash humidifier portion weekly. Use bottled, distilled water and change daily Be aware of reduced alertness and do not drive or operate heavy machinery if experiencing this or drowsiness.  Exercise encouraged, as tolerated. Healthy weight management discussed.  Avoid or decrease alcohol consumption and medications that make you more sleepy, if possible. Notify if persistent daytime sleepiness occurs even with consistent use of PAP therapy.  Orders placed for new mask   Use triamcinolone dental paste 1-2 times a day to ulcer until healed   Follow up in 6 weeks  with Dr. Belia Heman or Philis Nettle. If symptoms do not improve or worsen, please contact office for sooner follow up or seek emergency care. Ok to do video visit with [facility] in Friday PM clinic, if ok with patient

## 2023-11-17 NOTE — Assessment & Plan Note (Signed)
Seems to be related to current hybrid mask. Will change him to regular full face mask with AirFit F20 or similar style to avoid further pressure on the area. Given lack of improvement, will have him start topical steroid with triamcinolone dental paste. Side effect profile reviewed. Educated on proper use. Re-evaluate with PCP if no resolution despite above measures.

## 2023-11-17 NOTE — Addendum Note (Signed)
Addended by: Noemi Chapel on: 11/17/2023 11:13 AM   Modules accepted: Orders

## 2023-11-17 NOTE — Patient Instructions (Addendum)
Continue to use CPAP every night, minimum of 4-6 hours a night.  Change equipment as directed. Wash your tubing with warm soap and water daily, hang to dry. Wash humidifier portion weekly. Use bottled, distilled water and change daily Be aware of reduced alertness and do not drive or operate heavy machinery if experiencing this or drowsiness.  Exercise encouraged, as tolerated. Healthy weight management discussed.  Avoid or decrease alcohol consumption and medications that make you more sleepy, if possible. Notify if persistent daytime sleepiness occurs even with consistent use of PAP therapy.  Orders placed for new mask   Use triamcinolone dental paste 1-2 times a day to ulcer until healed   Follow up in 6 weeks  with Dr. Belia Heman or Philis Nettle. If symptoms do not improve or worsen, please contact office for sooner follow up or seek emergency care. Ok to do video visit with [facility] in Friday PM clinic, if ok with patient

## 2023-11-17 NOTE — Progress Notes (Signed)
@Patient  ID: Damon English, male    DOB: 09-06-1951, 73 y.o.   MRN: [id number]  Chief Complaint  Patient presents with   Follow-up    OSA on CPAP- never got the new mask. Has been getting sores in his mouth. Machine states that his mask needs adjusting some nights but not every night. He did talk to someone about the mask and thinks that is being taken care of.     Referring provider: [name], Cordelia Poche  HPI: 73 year old male, former smoker followed for sleep apnea.  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  TEST/EVENTS:  01/03/2023 HST: REI 17.7/h, 100% obstructive; SpO2 low 63% 08/26/2023 CPAP titration outside facility >> optimal setting 11 cm of water, residual AHI 3/h.  Was not evaluated in REM sleep.  03/06/2023: Ov with Allea Kassner NP for sleep consult, referred by [name], PA.  He had a home sleep study in March 2024 with [facility] which revealed moderate obstructive sleep apnea.  He had been having difficulties with his sleep for many years prior to this.  He wanted to switch care over to [facility] so he asked to have sleep consult here.  He was never started on CPAP therapy after his study and has never been on CPAP in the past.  He continues to have a lot of trouble with his sleep.  Feels tired in the morning; wants to sleep all day.  Significant daytime fatigue symptoms.  Usually has dry mouth when he wakes up.  Has loud snoring.  His wife is noticed that he gasps awake and sounds like he is choking at times.  Has had some sleepiness on longer distance drives but no issues around town and has never fallen asleep while driving. Some sleep talking. No sleep walking/paralysis.  He goes to bed between  9 to 11 PM.  Falls asleep quickly.  Wakes 2 or more times a night.  Usually gets up around 9:10 AM. No sleep aids.  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  Weight is up 10 to 15 pounds over the last 2 years. He has a significant cardiac history as well as history of stroke and diabetes.  He is a former smoker; quit 10 years ago.  Usually drinks 6-8 beers a day.  He has a past history of marijuana and cocaine use; quit 25 years ago.  No excessive caffeine intake.  Lives with his wife.  He is self-employed.  Has an adult child.  Family history of heart disease and cancer. [place] 6  06/02/2023: OV with Jazelyn Sipe NP for follow-up after being started on CPAP therapy.  He was diagnosed with COVID mid July.  He did not wear his CPAP for about 3 weeks during this period.  Trying to get back on therapy.  Having some difficulties with mask leakage and fit.  He did switch to a DreamWear fullface mask but does not seem to be making a big difference.  Still not feeling significantly better during the day.  Feels like he is usually tired and wants to sleep during the day.  Denies any recent drowsy driving or morning headaches.  No sleep parasomnia/paralysis. 05/02/2023-05/31/2023 CPAP 5-15 cmH2O 12/30 days; 30% >4 hr; average usage 5 hours  54 minutes Pressure 95th 9.2 Leaks 95th 58.3 AHI 10.3  08/18/2023: Ov with Estefania Kamiya NP for follow-up via video visit.  We made adjustments to his CPAP after his last visit due to significant leaks.  He was also not feeling much benefit during the day.  We brought him down to 5-9 cmH2O from 5 to 15 cm water.  He also switched over to an Milo fullface mask, which he is liking.  He actually is feeling better during the day.  Feels like his energy levels have improved.  He has been wearing the CPAP every night.  Does occasionally wake up and has noticed that he is taken it off but this does not happen very often.  Denies any issues with drowsy driving or morning headaches.   He  is having some cardiac issues with angina and dyspnea on exertion, which he is working with his cardiologist on.  No active chest pain. Understands that if symptoms worsen he should go to the emergency room. 07/18/2023-08/16/2023: CPAP 5-9 cmH2O 30/30 days; 87% >4 hr; average use 7 hr 39 min Pressure 95th 8.7 Leaks 95th 32.7 AHI 12.3/h, CAI 0.2   11/17/2023: Today-follow-up Patient presents today for follow-up.  Had his CPAP titration study at [facility] 11/9.  Unfortunately we did not receive these records but I was able to review them in care everywhere.  He contacted [place] in December to let us know that he had completed the study.  Settings were adjusted to 11 to 15 cm water.  This has been working relatively well for him; however the aborta fullface mask is causing a recurrent ulcer on his upper gum.  His dentist was the one who notices first.  It is tender.  Has difficulties getting it to go away.  Ulcer is right where his mask presses above upper lip.  He has not been able to tolerate nasal masks in the past because he keeps mouth breathing.  Chinstrap did not help.  Would like to move to a regular fullface mask. Otherwise, he does feel like the new settings are helping.  Feels like he is a little bit better rested and sleeping a little more restfully.  He is currently on Ozempic and noticed that when the titrated his dose up a few weeks ago, it has made him a little more fatigued but otherwise doing okay.  No issues with drowsy driving or morning headaches.  Still having some leaks on current settings.  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  Is on NOAC for now.  No ASA with it. Not allergic per patient.  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  Is on NOAC for now.  No ASA with it. Not allergic per patient.   Per pt not an allergy    Immunization History  Administered Date(s) Administered   Influenza Split 08/21/2012   Influenza, Quadrivalent, Recombinant, Inj, Pf 11/08/2017, 08/08/2018, 09/09/2019   Influenza, Seasonal, Injecte, Preservative Fre 07/17/2018   Influenza,inj,Quad PF,6+ Mos 11/01/2016   Influenza,inj,quad, With Preservative  11/12/2015   Influenza-Unspecified 07/23/2013, 07/27/2021, 07/17/2022   PFIZER Comirnaty(Gray Top)Covid-19 Tri-Sucrose Vaccine 10/30/2020   PFIZER(Purple Top)SARS-COV-2 Vaccination 12/16/2019, 01/13/2020   Pneumococcal Polysaccharide-23 01/29/2019   Tdap 03/20/2006    Past Medical History:  Diagnosis Date   Alcohol use    "cutting back" but still heavy and daily, multiple shots of bourbon each night   Diabetes mellitus without complication (HCC)    History of tobacco use    reportedly quit in 2013   Hypertension    Pulmonary embolism (HCC) 2012   unprovoked   PVD (peripheral vascular disease) (HCC)     Tobacco History: Social History   Tobacco Use  Smoking Status Former   Current packs/day: 0.00   Average packs/day: 2.0 packs/day for 40.0 years (80.0 ttl pk-yrs)   Types: Cigarettes   Start date: 11/27/1970   Quit date: 11/27/2010   Years since quitting: 12.9  Smokeless Tobacco Never  Tobacco Comments   Quit in 2012   Counseling given: Not Answered Tobacco comments: Quit in 2012   Outpatient Medications Prior to Visit  Medication Sig Dispense Refill   acetaminophen (TYLENOL) 325 MG tablet Take 2 tablets (650 mg total) by mouth every 6 (six) hours as needed for fever. 20 tablet 0   apixaban (ELIQUIS) 5 MG TABS tablet Take by mouth.      atorvastatin (LIPITOR) 80 MG tablet Take 80 mg by mouth daily.     clonazePAM (KLONOPIN) 0.25 MG disintegrating tablet Take 1-2 tablets (0.25-0.5 mg total) by mouth 3 (three) times daily as needed (anxiety/agitation). 20 tablet 0   clonazePAM (KLONOPIN) 0.5 MG tablet Take 0.5 mg by mouth 2 (two) times daily as needed.     clopidogrel (PLAVIX) 75 MG tablet Take 1 tablet (75 mg total) by mouth daily. 30 tablet 0   diclofenac Sodium (VOLTAREN) 1 % GEL Apply 4 g topically 4 (four) times daily.     DULoxetine (CYMBALTA) 30 MG capsule Take 30 mg by mouth daily.     famotidine (PEPCID) 40 MG tablet Take 1 tablet by mouth every morning.     furosemide (LASIX) 20 MG tablet Take 40 mg by mouth daily as needed (Edema (Or weight gain of >2lbs overnight or 5lbs in one week)).     gabapentin (NEURONTIN) 100 MG capsule Take 1-2 capsules (100-200 mg total) by mouth 2 (two) times daily. Follow written titration schedule 120 capsule 5   glipiZIDE (GLUCOTROL) 10 MG tablet Take 5 mg by mouth as needed.     JARDIANCE 10 MG TABS tablet Take 10 mg by mouth daily.     latanoprost (XALATAN) 0.005 % ophthalmic solution Place 1 drop into both eyes at bedtime. 2.5 mL 12   losartan (COZAAR) 25 MG tablet Take 25 mg by mouth daily.     metoprolol succinate (TOPROL-XL) 25 MG 24 hr tablet Take 25 mg by mouth at bedtime.     nitroGLYCERIN (NITROSTAT) 0.4 MG SL tablet Place 1 tablet (0.4 mg total) under the tongue every 5 (five) minutes as needed for chest pain. 20 tablet 12   pantoprazole (PROTONIX) 40 MG tablet Take 40 mg by mouth daily.     polyethylene glycol (MIRALAX / GLYCOLAX) 17 g packet Take 17 g by mouth daily as needed for mild constipation or moderate constipation. 14 each 0   potassium chloride (MICRO-K) 10 MEQ CR capsule Take 20 mEq by mouth daily.     ranolazine (RANEXA) 500 MG 12 hr tablet Take 500 mg by mouth 2 (  two) times daily.     Semaglutide,0.25 or 0.5MG /DOS, 2 MG/3ML SOPN Inject into the skin. (Patient not  taking: Reported on 06/09/2023)     SENNOSIDES PO Take by mouth.     spironolactone (ALDACTONE) 25 MG tablet Take 25 mg by mouth daily.     thiamine (VITAMIN B1) 100 MG tablet Take 100 mg by mouth daily.     triamcinolone cream (KENALOG) 0.1 % APPLY TWICE DAILY TO RASH ON LEGS UP TO 2 WEEKS/MONTH AS NEEDED.     No facility-administered medications prior to visit.     Review of Systems:   Constitutional: No night sweats, fevers, chills, or lassitude. +daytime fatigue (improving), intentional weight loss HEENT: No headaches, difficulty swallowing, tooth/dental problems, or sore throat. No sneezing, itching, ear ache, nasal congestion, or post nasal drip. +mouth sore CV:  No chest pain, orthopnea, PND, swelling in lower extremities, anasarca, dizziness, palpitations, syncope Resp: +snoring, witnessed apneas (without CPAP); shortness of breath with exertion (baseline; stable). No excess mucus or change in color of mucus. No productive or non-productive. No hemoptysis. No wheezing.  No chest wall deformity GI:  +occasional heartburn, indigestion. No abdominal pain, nausea, vomiting, diarrhea, change in bowel habits, loss of appetite, bloody stools.  GU: No dysuria, change in color of urine, urgency or frequency, nocturia  Skin: No rash, lesions, ulcerations. +easily bruised (on chronic AC) MSK:  +chronic joint pains. No joint swelling.   Neuro: No dizziness or lightheadedness.  Psych: +depression, anxiety (stable; baseline). No SI/HI. Mood stable. +sleep disturbance    Physical Exam:  BP 106/60   Pulse 81   Temp 97.8 F (36.6 C) (Temporal)   Resp 16   Ht 5\' 7"  (1.702 m)   Wt 215 lb 9.6 oz (97.8 kg)   SpO2 97%   BMI 33.77 kg/m   GEN: Pleasant, interactive, well-kempt; obese; in no acute distress. HEENT:  Normocephalic and atraumatic. PERRLA. Sclera white. Nasal turbinates pink, moist and patent bilaterally. No rhinorrhea present. Oropharynx pink and moist, without exudate or edema. No  postnasal drip. Mallampati III. Ulceration right upper gum  NECK:  Supple w/ fair ROM. No JVD present. Normal carotid impulses w/o bruits. Thyroid symmetrical with no goiter or nodules palpated. No lymphadenopathy.   CV: RRR, no m/r/g, no peripheral edema. Pulses intact, +2 bilaterally. No cyanosis, pallor or clubbing. PULMONARY:  Unlabored, regular breathing. Clear bilaterally A&P w/o wheezes/rales/rhonchi. No accessory muscle use.  GI: BS present and normoactive. Soft, non-tender to palpation. No organomegaly or masses detected. MSK: No erythema, warmth or tenderness. Cap refil <2 sec all extrem. No deformities or joint swelling noted.  Neuro: A/Ox3. No focal deficits noted.   Skin: Warm, no lesions or rashe Psych: Normal affect and behavior. Judgement and thought content appropriate.     Lab Results:  CBC    Component Value Date/Time   WBC 9.9 07/11/2021 1317   RBC 4.29 07/11/2021 1317   HGB 13.6 07/11/2021 1317   HGB 16.1 11/08/2013 1530   HCT 41.1 07/11/2021 1317   HCT 46.4 11/08/2013 1530   PLT 158 07/11/2021 1317   PLT 173 11/08/2013 1530   MCV 95.8 07/11/2021 1317   MCV 104 (H) 11/08/2013 1530   MCH 31.7 07/11/2021 1317   MCHC 33.1 07/11/2021 1317   RDW 16.1 (H) 07/11/2021 1317   RDW 12.3 11/08/2013 1530   LYMPHSABS 1.4 07/11/2021 1317   MONOABS 0.9 07/11/2021 1317   EOSABS 0.1 07/11/2021 1317   BASOSABS 0.1 07/11/2021 1317  BMET    Component Value Date/Time   NA 135 07/11/2021 1317   NA 136 11/08/2013 1530   K 4.2 07/11/2021 1317   K 3.4 (L) 11/08/2013 1530   CL 100 07/11/2021 1317   CL 102 11/08/2013 1530   CO2 21 (L) 07/11/2021 1317   CO2 27 11/08/2013 1530   GLUCOSE 139 (H) 07/11/2021 1317   GLUCOSE 150 (H) 11/08/2013 1530   BUN 30 (H) 11/04/2021 1026   BUN 16 11/08/2013 1530   CREATININE 1.53 (H) 11/04/2021 1026   CREATININE 1.07 11/08/2013 1530   CALCIUM 9.5 07/11/2021 1317   CALCIUM 9.3 11/08/2013 1530   GFRNONAA 49 (L) 11/04/2021 1026    GFRNONAA >60 11/08/2013 1530   GFRAA >60 01/30/2019 0652   GFRAA >60 11/08/2013 1530    BNP    Component Value Date/Time   BNP 218.5 (H) 07/11/2021 1317     Imaging:  No results found.  Administration History     None           No data to display          No results found for: "NITRICOXIDE"      Assessment & Plan:   Moderate obstructive sleep apnea Moderate OSA.  Difficulties tolerating CPAP as well as controlling his apnea.  He underwent CPAP titration study and adjusted his settings to 11 to 15 cm water.  He has significant improvement in residual AHI.  Decreased usage primarily related to ulceration and discomfort from current mask.  He has reached out to his medical supply company but has not had any success with changing masks.  Advised that he switch to a regular fullface mask.  Orders placed for AirFit F20.  He was also provided with a prescription that he can take to the DME company in case he has any further difficulties. Aware of risks of untreated OSA. Encouraged to increase usage once new mask received. Settings appear appropriate and controlling him well. Understands proper care/use. Reassess at follow up. Continue healthy weight loss measures. Safe driving practices.  Patient Instructions  Continue to use CPAP every night, minimum of 4-6 hours a night.  Change equipment as directed. Wash your tubing with warm soap and water daily, hang to dry. Wash humidifier portion weekly. Use bottled, distilled water and change daily Be aware of reduced alertness and do not drive or operate heavy machinery if experiencing this or drowsiness.  Exercise encouraged, as tolerated. Healthy weight management discussed.  Avoid or decrease alcohol consumption and medications that make you more sleepy, if possible. Notify if persistent daytime sleepiness occurs even with consistent use of PAP therapy.  Orders placed for new mask   Use triamcinolone dental paste 1-2 times a  day to ulcer until healed   Follow up in 6 weeks  with Dr. Belia Heman or Philis Nettle. If symptoms do not improve or worsen, please contact office for sooner follow up or seek emergency care. Ok to do video visit with [facility] in Friday PM clinic, if ok with patient    Aphthous ulcer Seems to be related to current hybrid mask. Will change him to regular full face mask with AirFit F20 or similar style to avoid further pressure on the area. Given lack of improvement, will have him start topical steroid with triamcinolone dental paste. Side effect profile reviewed. Educated on proper use. Re-evaluate with PCP if no resolution despite above measures.     I spent 35 minutes of dedicated to the care of this  patient on the date of this encounter to include pre-visit review of records, face-to-face time with the patient discussing conditions above, post visit ordering of testing, clinical documentation with the electronic health record, making appropriate referrals as documented, and communicating necessary findings to members of the patients care team.  Noemi Chapel, NP 11/17/2023  Pt aware and understands NP's role.

## 2023-11-19 DIAGNOSIS — G4733 Obstructive sleep apnea (adult) (pediatric): Secondary | ICD-10-CM | POA: Diagnosis not present

## 2023-11-21 DIAGNOSIS — G4733 Obstructive sleep apnea (adult) (pediatric): Secondary | ICD-10-CM | POA: Diagnosis not present

## 2023-11-23 ENCOUNTER — Encounter: Payer: Self-pay | Admitting: Student in an Organized Health Care Education/Training Program

## 2023-11-23 ENCOUNTER — Ambulatory Visit
Payer: BC Managed Care – PPO | Attending: Student in an Organized Health Care Education/Training Program | Admitting: Student in an Organized Health Care Education/Training Program

## 2023-11-23 VITALS — BP 116/64 | HR 59 | Temp 97.4°F | Resp 16 | Ht 67.0 in | Wt 210.0 lb

## 2023-11-23 DIAGNOSIS — I739 Peripheral vascular disease, unspecified: Secondary | ICD-10-CM | POA: Insufficient documentation

## 2023-11-23 DIAGNOSIS — M792 Neuralgia and neuritis, unspecified: Secondary | ICD-10-CM | POA: Diagnosis not present

## 2023-11-23 DIAGNOSIS — E1140 Type 2 diabetes mellitus with diabetic neuropathy, unspecified: Secondary | ICD-10-CM | POA: Diagnosis not present

## 2023-11-23 DIAGNOSIS — G894 Chronic pain syndrome: Secondary | ICD-10-CM | POA: Diagnosis not present

## 2023-11-23 NOTE — Progress Notes (Signed)
 PROVIDER NOTE: Information contained herein reflects review and annotations entered in association with encounter. Interpretation of such information and data should be left to medically-trained personnel. Information provided to patient can be located elsewhere in the medical record under Patient Instructions. Document created using STT-dictation technology, any transcriptional errors that may result from process are unintentional.    Patient: Damon English  Service Category: E/M  Provider: Wallie Sherry, MD  DOB: 01-Nov-1950  DOS: 11/23/2023  Referring Provider: Montey English English  MRN: [id number]  Specialty: Interventional Pain Management  PCP: Damon Rankin, PA-C  Type: Established Patient  Setting: Ambulatory outpatient    Location: Office  Delivery: Face-to-face     HPI  [name], a 73 y.o. 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 [name] primary complain today is Peripheral Neuropathy (Right foot)   Pain Assessment: Severity of Neuropathic pain is reported as a 6 /10. Location: Foot Right/ . Onset: More than a month ago. Quality: Tingling, Numbness, Aching, Throbbing, Pins and needles (feel like foot is asleep). Timing: Constant. Modifying factor(s): Qutenza . Vitals:  height is 5' 7 (1.702 m) and weight is 210 lb (95.3 kg). His temperature is 97.4 F (36.3 C) (abnormal). His blood pressure is 116/64 and his pulse is 59 (abnormal). His respiration is 16 and oxygen saturation is 98%.  BMI: Estimated body mass index is 32.89 kg/m as calculated from the following:   Height as of this encounter: 5' 7 (1.702 m).   Weight as of this encounter: 210 lb (95.3 kg). Last encounter: 02/14/2023. Last procedure: Visit date not found.  Reason for encounter: patient-requested evaluation.    History of chronic bilateral foot paresthesias related to diabetic neuropathic pain status post 3 Qutenza  treatments on 03/09/2022,  06/15/2022, 10/26/2022.  Given increase in neuropathic pain, discussed repeating Qutenza .    ROS  Constitutional: Denies any fever or chills Gastrointestinal: No reported hemesis, hematochezia, vomiting, or acute GI distress Musculoskeletal:  Bilateral foot paresthesias Neurological: No reported episodes of acute onset apraxia, aphasia, dysarthria, agnosia, amnesia, paralysis, loss of coordination, or loss of consciousness  Medication Review  DULoxetine , Semaglutide(0.25 or 0.5MG /DOS), Sennosides, acetaminophen , apixaban , atorvastatin , clonazePAM , clopidogrel , diclofenac Sodium, empagliflozin, famotidine , furosemide , gabapentin , glipiZIDE , latanoprost , losartan , metoprolol  succinate, nitroGLYCERIN , pantoprazole , polyethylene glycol, potassium chloride , ranolazine , spironolactone , thiamine , triamcinolone , and triamcinolone  cream  History Review  Allergy: [name] is allergic to amoxicillin, hydromorphone  hcl, and aspirin . Drug: [name]  reports no history of drug use. Alcohol:  reports current alcohol use of about 7.0 standard drinks of alcohol per week. Tobacco:  reports that he quit smoking about 12 years ago. His smoking use included cigarettes. He started smoking about 53 years ago. He has a 80 pack-year smoking history. He has never used smokeless tobacco. Social: Damon English  reports that he quit smoking about 12 years ago. His smoking use included cigarettes. He started smoking about 53 years ago. He has a 80 pack-year smoking history. He has never used smokeless tobacco. He reports current alcohol use of about 7.0 standard drinks of alcohol per week. He reports that he does not use drugs. Medical:  has a past medical history of Alcohol use, Diabetes mellitus without complication (HCC), History of tobacco use, Hypertension, Pulmonary embolism (HCC) (2012), and PVD (peripheral vascular disease) (HCC). Surgical: Damon English  has a past surgical history that includes Vascular  surgery; Cholecystectomy; Back surgery; Femoral artery - popliteal artery bypass graft (Right, 06/19/2018); ERCP w/ sphicterotomy (2015);  Thromboendarterectomy (Right, 06/19/2018); Tonsillectomy; CORONARY ANGIOGRAPHY (N/A, 01/30/2019); Lower Extremity Angiography (Left, 01/14/2021); LEFT HEART CATH AND CORS/GRAFTS ANGIOGRAPHY (N/A, 06/25/2021); TEMPORARY PACEMAKER (N/A, 07/11/2021); and Lower Extremity Angiography (Left, 11/04/2021). Family: family history includes AAA (abdominal aortic aneurysm) in his father; Alzheimer's disease in his father; Heart attack in his father; Hypertension in his mother; Parkinson's disease in his mother; Vascular Disease in his father.  Laboratory Chemistry Profile   Renal Lab Results  Component Value Date   BUN 30 (H) 11/04/2021   CREATININE 1.53 (H) 11/04/2021   GFRAA >60 01/30/2019   GFRNONAA 49 (L) 11/04/2021    Hepatic Lab Results  Component Value Date   AST 28 07/11/2021   ALT 26 07/11/2021   ALBUMIN  3.8 07/11/2021   ALKPHOS 121 07/11/2021   LIPASE 31 01/28/2019    Electrolytes Lab Results  Component Value Date   NA 135 07/11/2021   K 4.2 07/11/2021   CL 100 07/11/2021   CALCIUM  9.5 07/11/2021   MG 1.9 07/11/2021   PHOS 4.2 06/25/2021    Bone No results found for: VD25OH, VD125OH2TOT, CI6874NY7, CI7874NY7, 25OHVITD1, 25OHVITD2, 25OHVITD3, TESTOFREE, TESTOSTERONE  Inflammation (CRP: Acute Phase) (ESR: Chronic Phase) Lab Results  Component Value Date   LATICACIDVEN 1.3 06/19/2021         Note: Above Lab results reviewed.  Recent Imaging Review  VAS US  ABI WITH/WO TBI  LOWER EXTREMITY DOPPLER STUDY  Patient Name:  Damon English  Date of Exam:   06/09/2023 Medical Rec #: [id number]         Accession #:    [id number] Date of Birth: May 17, 1951          Patient Gender: M Patient Age:   65 years Exam Location:  [facility] Procedure:      VAS US  ABI WITH/WO TBI Referring  Phys:  --------------------------------------------------------------------------------   Indications: Peripheral artery disease.  High Risk Factors: Hypertension, prior MI, coronary artery disease.   Vascular Interventions: 01/14/21: Bilateral CIA & right EIA stents;                         11/04/21: Left SFA-popliteal stent;.  Comparison Study: 11/2022  Performing Technologist: Jerel Croak RVT    Examination Guidelines: A complete evaluation includes at minimum, Doppler waveform signals and systolic blood pressure reading at the level of bilateral brachial, anterior tibial, and posterior tibial arteries, when vessel segments are accessible. Bilateral testing is considered an integral part of a complete examination. Photoelectric Plethysmograph (PPG) waveforms and toe systolic pressure readings are included as required and additional duplex testing as needed. Limited examinations for reoccurring indications may be performed as noted.    ABI Findings: +---------+------------------+-----+---------+--------+ Right    Rt Pressure (mmHg)IndexWaveform Comment  +---------+------------------+-----+---------+--------+ Brachial 120                                      +---------+------------------+-----+---------+--------+ ATA      133               1.11 triphasic         +---------+------------------+-----+---------+--------+ PTA      125               1.04 triphasic         +---------+------------------+-----+---------+--------+ Great Toe75                0.62 Abnormal          +---------+------------------+-----+---------+--------+  +---------+------------------+-----+---------+-------+  Left     Lt Pressure (mmHg)IndexWaveform Comment +---------+------------------+-----+---------+-------+ ATA      129               1.08 triphasic        +---------+------------------+-----+---------+-------+ PTA      122               1.02 triphasic         +---------+------------------+-----+---------+-------+ Great Toe73                0.61 Abnormal         +---------+------------------+-----+---------+-------+  +-------+-----------+-----------+------------+------------+ ABI/TBIToday's ABIToday's TBIPrevious ABIPrevious TBI +-------+-----------+-----------+------------+------------+ Right  1.11       .62        .98         .57          +-------+-----------+-----------+------------+------------+ Left   1.08       .61        1.01        .40          +-------+-----------+-----------+------------+------------+     Bilateral ABIs appear essentially unchanged compared to prior study on 11/2022.   Summary: Right: Resting right ankle-brachial index is within normal range. The right toe-brachial index is abnormal.  Left: Resting left ankle-brachial index is within normal range. The left toe-brachial index is abnormal.  *See table(s) above for measurements and observations.    Electronically signed by Selinda Gu MD on 06/20/2023 at 9:03:06 AM.      Final   Note: Reviewed        Physical Exam  General appearance: Well nourished, well developed, and well hydrated. In no apparent acute distress Mental status: Alert, oriented x 3 (person, place, & time)       Respiratory: No evidence of acute respiratory distress Eyes: PERLA Vitals: BP 116/64   Pulse (!) 59   Temp (!) 97.4 F (36.3 C)   Resp 16   Ht 5' 7 (1.702 m)   Wt 210 lb (95.3 kg)   SpO2 98%   BMI 32.89 kg/m  BMI: Estimated body mass index is 32.89 kg/m as calculated from the following:   Height as of this encounter: 5' 7 (1.702 m).   Weight as of this encounter: 210 lb (95.3 kg). Ideal: Ideal body weight: 66.1 kg (145 lb 11.6 oz) Adjusted ideal body weight: 77.8 kg (171 lb 6.9 oz)  Bilateral foot paresthesias, right greater than left  Assessment   Diagnosis Status  1. Chronic painful diabetic neuropathy (HCC)   2. Chronic pain syndrome    3. Intractable neuropathic pain of lower extremity, right   4. Peripheral arterial disease (HCC)    Controlled Controlled Controlled   Updated Problems: No problems updated.  Plan of Care  Continue gabapentin  as prescribed, follow-up for repeat Qutenza   Orders:  Orders Placed This Encounter  Procedures   NEUROLYSIS    Please order Qutenza  patches from pharmacy    Standing Status:   Future    Expected Date:   12/07/2023    Expiration Date:   02/20/2024    Where will this procedure be performed?:   [facility]   Follow-up plan:   Return in about 2 weeks (around 12/07/2023) for Qutenza .      Qutenza  03/09/22, 06/15/22, 10/26/22       Recent Visits No visits were found meeting these conditions. Showing recent visits within past 90 days and meeting all other requirements Today's Visits Date Type Provider Dept  11/23/23 Office Visit Marcelino Nurse, MD [facility]  Showing today's visits and meeting all other requirements Future Appointments Date Type Provider Dept  12/06/23 Appointment Marcelino Nurse, MD [facility]  Showing future appointments within next 90 days and meeting all other requirements  I discussed the assessment and treatment plan with the patient. The patient was provided an opportunity to ask questions and all were answered. The patient agreed with the plan and demonstrated an understanding of the instructions.  Patient advised to call back or seek an in-person evaluation if the symptoms or condition worsens.  Duration of encounter: 15 minutes.  Total time on encounter, as per AMA guidelines included both the face-to-face and non-face-to-face time personally spent by the physician and/or other qualified health care professional(s) on the day of the encounter (includes time in activities that require the physician or other qualified health care professional and does not include time in activities normally performed by clinical staff).  Physician's time may include the following activities when performed: Preparing to see the patient (e.g., pre-charting review of records, searching for previously ordered imaging, lab work, and nerve conduction tests) Review of prior analgesic pharmacotherapies. Reviewing PMP Interpreting ordered tests (e.g., lab work, imaging, nerve conduction tests) Performing post-procedure evaluations, including interpretation of diagnostic procedures Obtaining and/or reviewing separately obtained history Performing a medically appropriate examination and/or evaluation Counseling and educating the patient/family/caregiver Ordering medications, tests, or procedures Referring and communicating with other health care professionals (when not separately reported) Documenting clinical information in the electronic or other health record Independently interpreting results (not separately reported) and communicating results to the patient/ family/caregiver Care coordination (not separately reported)  Note by: Nurse Marcelino, MD Date: 11/23/2023; Time: 12:10 PM

## 2023-11-23 NOTE — Progress Notes (Signed)
 Safety precautions to be maintained throughout the outpatient stay will include: orient to surroundings, keep bed in low position, maintain call bell within reach at all times, provide assistance with transfer out of bed and ambulation.

## 2023-12-06 ENCOUNTER — Ambulatory Visit: Payer: BC Managed Care – PPO | Admitting: Student in an Organized Health Care Education/Training Program

## 2023-12-06 DIAGNOSIS — I5032 Chronic diastolic (congestive) heart failure: Secondary | ICD-10-CM | POA: Diagnosis not present

## 2023-12-06 DIAGNOSIS — I25118 Atherosclerotic heart disease of native coronary artery with other forms of angina pectoris: Secondary | ICD-10-CM | POA: Diagnosis not present

## 2023-12-14 DIAGNOSIS — Z1211 Encounter for screening for malignant neoplasm of colon: Secondary | ICD-10-CM | POA: Diagnosis not present

## 2023-12-14 DIAGNOSIS — Z950 Presence of cardiac pacemaker: Secondary | ICD-10-CM | POA: Diagnosis not present

## 2023-12-14 DIAGNOSIS — Z7901 Long term (current) use of anticoagulants: Secondary | ICD-10-CM | POA: Diagnosis not present

## 2023-12-14 DIAGNOSIS — I5022 Chronic systolic (congestive) heart failure: Secondary | ICD-10-CM | POA: Diagnosis not present

## 2023-12-15 ENCOUNTER — Ambulatory Visit (INDEPENDENT_AMBULATORY_CARE_PROVIDER_SITE_OTHER): Payer: BC Managed Care – PPO

## 2023-12-15 ENCOUNTER — Encounter (INDEPENDENT_AMBULATORY_CARE_PROVIDER_SITE_OTHER): Payer: Self-pay | Admitting: Vascular Surgery

## 2023-12-15 ENCOUNTER — Ambulatory Visit (INDEPENDENT_AMBULATORY_CARE_PROVIDER_SITE_OTHER): Payer: Medicare Other | Admitting: Vascular Surgery

## 2023-12-15 VITALS — BP 104/61 | HR 75 | Resp 16 | Wt 212.0 lb

## 2023-12-15 DIAGNOSIS — E1140 Type 2 diabetes mellitus with diabetic neuropathy, unspecified: Secondary | ICD-10-CM

## 2023-12-15 DIAGNOSIS — I680 Cerebral amyloid angiopathy: Secondary | ICD-10-CM

## 2023-12-15 DIAGNOSIS — I10 Essential (primary) hypertension: Secondary | ICD-10-CM

## 2023-12-15 DIAGNOSIS — E785 Hyperlipidemia, unspecified: Secondary | ICD-10-CM

## 2023-12-15 DIAGNOSIS — I442 Atrioventricular block, complete: Secondary | ICD-10-CM

## 2023-12-15 DIAGNOSIS — E854 Organ-limited amyloidosis: Secondary | ICD-10-CM

## 2023-12-15 DIAGNOSIS — I890 Lymphedema, not elsewhere classified: Secondary | ICD-10-CM

## 2023-12-15 DIAGNOSIS — I70222 Atherosclerosis of native arteries of extremities with rest pain, left leg: Secondary | ICD-10-CM

## 2023-12-15 NOTE — Assessment & Plan Note (Signed)
 His ABIs today are 1.16 on the right and 1.20 on the left with biphasic waveforms and normal digital waveforms bilaterally.  His perfusion is intact.  His surgical bypass from [facility] appears to be patent and his percutaneous intervention on the left side performed here is also patent.  Overall from a perfusion standpoint, he is doing well.  Recheck in 6 months.

## 2023-12-15 NOTE — Progress Notes (Signed)
 MRN : [id number]  Damon English is a 73 y.o. ([birth year]) male who presents with chief complaint of  Chief Complaint  Patient presents with   Follow-up    6 month abi follow up  .  History of Present Illness: Patient returns today in follow up of his PAD.  He is having some swelling in his legs and has been intermittently wearing his compression socks which seem to help.  No open wounds or infection.  No fevers or chills.  Not really having significant pain.  He denies any lifestyle limiting claudication or rest pain.  He said no focal neurologic symptoms.  He is scheduled for a colonoscopy in the coming weeks.  His ABIs today are 1.16 on the right and 1.20 on the left with biphasic waveforms and normal digital waveforms bilaterally.  Current Outpatient Medications  Medication Sig Dispense Refill   acetaminophen (TYLENOL) 325 MG tablet Take 2 tablets (650 mg total) by mouth every 6 (six) hours as needed for fever. 20 tablet 0   apixaban (ELIQUIS) 5 MG TABS tablet Take by mouth.     atorvastatin (LIPITOR) 80 MG tablet Take 80 mg by mouth daily.     clonazePAM (KLONOPIN) 0.25 MG disintegrating tablet Take 1-2 tablets (0.25-0.5 mg total) by mouth 3 (three) times daily as needed (anxiety/agitation). 20 tablet 0   clonazePAM (KLONOPIN) 0.5 MG tablet Take 0.5 mg by mouth 2 (two) times daily as needed.     clopidogrel (PLAVIX) 75 MG tablet Take 1 tablet (75 mg total) by mouth daily. 30 tablet 0   diclofenac Sodium (VOLTAREN) 1 % GEL Apply 4 g topically 4 (four) times daily.     DULoxetine (CYMBALTA) 30 MG capsule Take 30 mg by mouth daily.     famotidine (PEPCID) 40 MG tablet Take 1 tablet by mouth every morning.     glipiZIDE (GLUCOTROL) 10 MG tablet Take 5 mg by mouth as needed.     JARDIANCE 10 MG TABS tablet Take 10 mg by mouth daily.     latanoprost (XALATAN) 0.005 % ophthalmic solution Place 1 drop into both eyes at bedtime. 2.5 mL 12   losartan (COZAAR) 25 MG tablet Take 25 mg by mouth  daily.     metoprolol succinate (TOPROL-XL) 25 MG 24 hr tablet Take 25 mg by mouth at bedtime.     nitroGLYCERIN (NITROSTAT) 0.4 MG SL tablet Place 1 tablet (0.4 mg total) under the tongue every 5 (five) minutes as needed for chest pain. 20 tablet 12   pantoprazole (PROTONIX) 40 MG tablet Take 40 mg by mouth daily.     polyethylene glycol (MIRALAX / GLYCOLAX) 17 g packet Take 17 g by mouth daily as needed for mild constipation or moderate constipation. 14 each 0   potassium chloride (MICRO-K) 10 MEQ CR capsule Take 20 mEq by mouth daily.     ranolazine (RANEXA) 500 MG 12 hr tablet Take 500 mg by mouth 2 (two) times daily.     Semaglutide,0.25 or 0.5MG /DOS, 2 MG/3ML SOPN Inject into the skin.     SENNOSIDES PO Take by mouth.     spironolactone (ALDACTONE) 25 MG tablet Take 25 mg by mouth daily.     thiamine (VITAMIN B1) 100 MG tablet Take 100 mg by mouth daily.     torsemide (DEMADEX) 20 MG tablet Take 40 mg by mouth daily.     triamcinolone (KENALOG) 0.1 % paste Use as directed 1 Application in the mouth or throat 2 (  two) times daily. Until healed 20 g 0   triamcinolone cream (KENALOG) 0.1 % APPLY TWICE DAILY TO RASH ON LEGS UP TO 2 WEEKS/MONTH AS NEEDED.     furosemide (LASIX) 20 MG tablet Take 40 mg by mouth daily as needed (Edema (Or weight gain of >2lbs overnight or 5lbs in one week)). (Patient not taking: Reported on 12/15/2023)     gabapentin (NEURONTIN) 100 MG capsule Take 1-2 capsules (100-200 mg total) by mouth 2 (two) times daily. Follow written titration schedule 120 capsule 5   No current facility-administered medications for this visit.    Past Medical History:  Diagnosis Date   Alcohol use    "cutting back" but still heavy and daily, multiple shots of bourbon each night   Diabetes mellitus without complication (HCC)    History of tobacco use    reportedly quit in 2013   Hypertension    Pulmonary embolism (HCC) 2012   unprovoked   PVD (peripheral vascular disease) (HCC)      Past Surgical History:  Procedure Laterality Date   BACK SURGERY     CHOLECYSTECTOMY     CORONARY ANGIOGRAPHY N/A 01/30/2019   Procedure: CORONARY ANGIOGRAPHY;  Surgeon: Lamar Blinks, MD;  Location: [facility];  Service: Cardiovascular;  Laterality: N/A;   ERCP W/ SPHICTEROTOMY  2015   FEMORAL ARTERY - POPLITEAL ARTERY BYPASS GRAFT Right 06/19/2018   LEFT HEART CATH AND CORS/GRAFTS ANGIOGRAPHY N/A 06/25/2021   Procedure: LEFT HEART CATH AND CORS/GRAFTS ANGIOGRAPHY;  Surgeon: Armando Reichert, MD;  Location: [facility] CV [facility];  Service: Cardiovascular;  Laterality: N/A;   LOWER EXTREMITY ANGIOGRAPHY Left 01/14/2021   Procedure: LOWER EXTREMITY ANGIOGRAPHY;  Surgeon: Annice Needy, MD;  Location: [facility];  Service: Cardiovascular;  Laterality: Left;   LOWER EXTREMITY ANGIOGRAPHY Left 11/04/2021   Procedure: LOWER EXTREMITY ANGIOGRAPHY;  Surgeon: Annice Needy, MD;  Location: [facility];  Service: Cardiovascular;  Laterality: Left;   TEMPORARY PACEMAKER N/A 07/11/2021   Procedure: TEMPORARY PACEMAKER;  Surgeon: Iran Ouch, MD;  Location: [facility];  Service: Cardiovascular;  Laterality: N/A;   THROMBOENDARTERECTOMY Right 06/19/2018   TONSILLECTOMY     VASCULAR SURGERY       Social History   Tobacco Use   Smoking status: Former    Current packs/day: 0.00    Average packs/day: 2.0 packs/day for 40.0 years (80.0 ttl pk-yrs)    Types: Cigarettes    Start date: 11/27/1970    Quit date: 11/27/2010    Years since quitting: 13.0   Smokeless tobacco: Never   Tobacco comments:    Quit in 2012  Vaping Use   Vaping status: Never Used  Substance Use Topics   Alcohol use: Yes    Alcohol/week: 7.0 standard drinks of alcohol    Types: 7 Shots of liquor per week    Comment: stopped alcohol 06/17/2021   Drug use: Never       Family History  Problem Relation Age of Onset   Hypertension Mother    Parkinson's disease Mother    Heart  attack Father    Vascular Disease Father    AAA (abdominal aortic aneurysm) Father    Alzheimer's disease Father      Allergies  Allergen Reactions   Amoxicillin Other (See Comments)    Abdominal cramps Other reaction(s): Other (See Comments) Abdominal cramps Tolerated ancef 7/16   Hydromorphone Hcl     Quick & severe nausea/vomiting  Other reaction(s): Vomiting Quick & severe nausea/vomiting    Aspirin Other (See Comments)    Other reaction(s): Other (See Comments) He has likely cerebral amyloid.  Is on NOAC for now.  No ASA with it. Not allergic per patient.  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  Is on NOAC for now.  No ASA with it. Not allergic per patient.   Per pt not an allergy     REVIEW OF SYSTEMS (Negative unless checked)   Constitutional: [] Weight loss  [] Fever  [] Chills Cardiac: [] Chest pain   [] Chest pressure   [x] Palpitations   [] Shortness of breath when laying flat   [] Shortness of breath at rest   [x] Shortness of breath with exertion. Vascular:  [x] Pain in legs with walking   [] Pain in legs at rest   [] Pain in legs when laying flat   [] Claudication   [] Pain in feet when walking  [] Pain in feet at rest  [] Pain in feet when laying flat   [] History of DVT   [] Phlebitis   [x] Swelling in legs   [] Varicose veins   [] Non-healing ulcers Pulmonary:   [] Uses home oxygen   [] Productive cough   [] Hemoptysis   [] Wheeze  [] COPD   [] Asthma Neurologic:  [] Dizziness  [] Blackouts   [] Seizures   [] History of stroke   [] History of TIA  [] Aphasia   [] Temporary blindness   [] Dysphagia   [] Weakness or numbness in arms   [] Weakness or numbness in legs Musculoskeletal:  [x] Arthritis   [] Joint swelling   [x] Joint pain   [] Low back pain Hematologic:  [] Easy bruising  [] Easy bleeding   [] Hypercoagulable state   [] Anemic   Gastrointestinal:  [] Blood in stool   [] Vomiting blood  [] Gastroesophageal reflux/heartburn   [] Abdominal pain Genitourinary:  [] Chronic kidney disease   [] Difficult urination   [] Frequent urination  [] Burning with urination   [] Hematuria Skin:  [] Rashes   [] Ulcers   [] Wounds Psychological:  [] History of anxiety   []  History of major depression.   Physical Examination  BP 104/61   Pulse 75   Resp 16   Wt 212 lb (96.2 kg)   BMI 33.20 kg/m  Gen:  WD/WN, NAD Head: Divide/AT, No temporalis wasting. Ear/Nose/Throat: Hearing grossly intact, nares w/o erythema or drainage Eyes: Conjunctiva clear. Sclera non-icteric Neck: Supple.  Trachea midline Pulmonary:  Good air movement, no use of accessory muscles.  Cardiac: RRR, no JVD Vascular:  Vessel Right Left  Radial Palpable Palpable                          PT Trace Palpable 1+ Palpable  DP 1+ Palpable 1+ Palpable   Gastrointestinal: soft, non-tender/non-distended. No guarding/reflex.  Musculoskeletal: M/S 5/5 throughout.  No deformity or atrophy.  2+ right lower extremity edema, 1+ left lower extremity edema. Neurologic: Sensation grossly intact in extremities.  Symmetrical.  Speech is fluent.  Psychiatric: Judgment intact, Mood & affect appropriate for pt's clinical situation. Dermatologic: No rashes or ulcers noted.  No cellulitis or open wounds.      Labs No results found for this or any previous visit (from the past 2160 hours).  Radiology No results found.  Assessment/Plan  Atherosclerosis of native arteries of extremity with rest pain (HCC) His ABIs today are 1.16 on the right and 1.20 on the left with biphasic waveforms and normal digital waveforms bilaterally.  His perfusion is intact.  His surgical bypass from [facility] appears to be patent and his percutaneous intervention on the left side  performed here is also patent.  Overall from a perfusion standpoint, he is doing well.  Recheck in 6 months.  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HCC) In a patient with extensive vascular disease elsewhere, we will plan on checking a carotid duplex at his next visit.  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 Continue statin therapy     Type 2 diabetes mellitus (HCC) blood glucose control important in reducing the progression of atherosclerotic disease. Also, involved in wound healing. On appropriate medications.     HTN (hypertension) blood pressure control important in reducing the progression of atherosclerotic disease. On appropriate oral medications.   Lymphedema Right leg.  After surgical bypass at [facility] several years ago.  Now has swelling on both sides.  Would promote the use of compression socks more regularly.  Festus Barren, MD  12/15/2023 12:28 PM    This note was created with Dragon medical transcription system.  Any errors from dictation are purely unintentional

## 2023-12-15 NOTE — Assessment & Plan Note (Signed)
 In a patient with extensive vascular disease elsewhere, we will plan on checking a carotid duplex at his next visit.

## 2023-12-17 DIAGNOSIS — G4733 Obstructive sleep apnea (adult) (pediatric): Secondary | ICD-10-CM | POA: Diagnosis not present

## 2023-12-18 DIAGNOSIS — Z79899 Other long term (current) drug therapy: Secondary | ICD-10-CM | POA: Diagnosis not present

## 2023-12-19 LAB — VAS US ABI WITH/WO TBI
Left ABI: 1.2
Right ABI: 1.16

## 2023-12-20 ENCOUNTER — Encounter: Payer: Self-pay | Admitting: Student in an Organized Health Care Education/Training Program

## 2023-12-20 ENCOUNTER — Ambulatory Visit
Payer: BC Managed Care – PPO | Attending: Student in an Organized Health Care Education/Training Program | Admitting: Student in an Organized Health Care Education/Training Program

## 2023-12-20 VITALS — BP 108/52 | HR 72 | Temp 97.3°F | Ht 67.0 in | Wt 211.0 lb

## 2023-12-20 DIAGNOSIS — L03116 Cellulitis of left lower limb: Secondary | ICD-10-CM | POA: Diagnosis not present

## 2023-12-20 DIAGNOSIS — Z23 Encounter for immunization: Secondary | ICD-10-CM | POA: Diagnosis not present

## 2023-12-20 DIAGNOSIS — G894 Chronic pain syndrome: Secondary | ICD-10-CM | POA: Diagnosis not present

## 2023-12-20 DIAGNOSIS — E1140 Type 2 diabetes mellitus with diabetic neuropathy, unspecified: Secondary | ICD-10-CM | POA: Insufficient documentation

## 2023-12-20 DIAGNOSIS — I83023 Varicose veins of left lower extremity with ulcer of ankle: Secondary | ICD-10-CM | POA: Diagnosis not present

## 2023-12-20 DIAGNOSIS — I739 Peripheral vascular disease, unspecified: Secondary | ICD-10-CM | POA: Diagnosis not present

## 2023-12-20 DIAGNOSIS — L97329 Non-pressure chronic ulcer of left ankle with unspecified severity: Secondary | ICD-10-CM | POA: Diagnosis not present

## 2023-12-20 MED ORDER — CAPSAICIN-CLEANSING GEL 8 % EX KIT
4.0000 | PACK | Freq: Once | CUTANEOUS | Status: AC
  Administered 2023-12-20: 2 via TOPICAL
  Filled 2023-12-20: qty 4

## 2023-12-20 NOTE — Progress Notes (Signed)
 PROVIDER NOTE: Interpretation of information contained herein should be left to medically-trained personnel. Specific patient instructions are provided elsewhere under "Patient Instructions" section of medical record. This document was created in part using STT-dictation technology, any transcriptional errors that may result from this process are unintentional.  Patient: Damon English Type: Established DOB: 1951-04-18 MRN: [id number] PCP: [name], PA-C  Service: Procedure DOS: 12/20/2023 Setting: Ambulatory Location: Ambulatory outpatient facility Delivery: Face-to-face Provider: Edward Jolly, MD Specialty: Interventional Pain Management Specialty designation: 09 Location: Outpatient facility Ref. Prov.: [name], PA-C    Primary Reason for Visit: Interventional Pain Management Treatment. CC: Foot Pain (right)    Procedure:          Qutenza treatment for right foot painful diabetic neuropathy #4   1. Chronic painful diabetic neuropathy (HCC)   2. Chronic pain syndrome    NAS-11 Pain score:   Pre-procedure: 6 /10   Post-procedure: 4 /10     Pre-op H&P Assessment:  Mr. Goyne is a 73 y.o. (year old), male patient, seen today for interventional treatment. 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 Mr. Binette has a current medication list which includes the following prescription(s): acetaminophen, apixaban, atorvastatin, clonazepam, clonazepam, clopidogrel, diclofenac sodium, duloxetine, famotidine, gabapentin, glipizide, jardiance,  latanoprost, losartan, metoprolol succinate, nitroglycerin, pantoprazole, polyethylene glycol, potassium chloride, ranolazine, semaglutide(0.25 or 0.5mg /dos), sennosides, spironolactone, thiamine, torsemide, triamcinolone, triamcinolone cream, and furosemide. His primarily concern today is the Foot Pain (right)  Initial Vital Signs:  Pulse/HCG Rate: 72  Temp: (!) 97.3 F (36.3 C) Resp:   BP: (!) 108/52 SpO2: 97 %  BMI: Estimated body mass index is 33.05 kg/m as calculated from the following:   Height as of this encounter: 5\' 7"  (1.702 m).   Weight as of this encounter: 211 lb (95.7 kg).  Risk Assessment: Allergies: Reviewed. He is allergic to amoxicillin, hydromorphone hcl, and aspirin.  Allergy Precautions: None required Coagulopathies: Reviewed. None identified.  Blood-thinner therapy: None at this time Active Infection(s): Reviewed. None identified. Mr. Maclay is afebrile  Site Confirmation: Mr. Gobert was asked to confirm the procedure and laterality before marking the site Procedure checklist: Completed Consent: Before the procedure and under the influence of no sedative(s), amnesic(s), or anxiolytics, the patient was informed of the treatment options, risks and possible complications. To fulfill our ethical and legal obligations, as recommended by the American Medical Association's Code of Ethics, I have informed the patient of my clinical impression; the nature and purpose of the treatment or procedure; the risks, benefits, and possible complications of the intervention; the alternatives, including doing nothing; the risk(s) and benefit(s) of the alternative treatment(s) or procedure(s); and the risk(s) and benefit(s) of doing nothing. The patient was provided information about the general risks and possible complications associated with the procedure. These may include, but are not limited to: failure to achieve desired goals, infection, bleeding, organ or nerve damage,  allergic reactions, paralysis, and death. In addition, the patient was informed of those risks and complications associated to the procedure, such as failure to decrease pain; infection; bleeding; organ or nerve damage with subsequent damage to sensory, motor, and/or autonomic systems, resulting in permanent pain, numbness, and/or weakness of one or several areas  of the body; allergic reactions; (i.e.: anaphylactic reaction); and/or death. Furthermore, the patient was informed of those risks and complications associated with the medications. These include, but are not limited to: allergic reactions (i.e.: anaphylactic or anaphylactoid reaction(s)); adrenal axis suppression; blood sugar elevation that in diabetics may result in ketoacidosis or comma; water retention that in patients with history of congestive heart failure may result in shortness of breath, pulmonary edema, and decompensation with resultant heart failure; weight gain; swelling or edema; medication-induced neural toxicity; particulate matter embolism and blood vessel occlusion with resultant organ, and/or nervous system infarction; and/or aseptic necrosis of one or more joints. Finally, the patient was informed that Medicine is not an exact science; therefore, there is also the possibility of unforeseen or unpredictable risks and/or possible complications that may result in a catastrophic outcome. The patient indicated having understood very clearly. We have given the patient no guarantees and we have made no promises. Enough time was given to the patient to ask questions, all of which were answered to the patient's satisfaction. Mr. Tsuda has indicated that he wanted to continue with the procedure. Attestation: I, the ordering provider, attest that I have discussed with the patient the benefits, risks, side-effects, alternatives, likelihood of achieving goals, and potential problems during recovery for the procedure that I have provided  informed consent. Date  Time: 12/20/2023 11:28 AM  Pre-Procedure Preparation:  Monitoring: As per clinic protocol. Respiration, ETCO2, SpO2, BP, heart rate and rhythm monitor placed and checked for adequate function Safety Precautions: Patient was assessed for positional comfort and pressure points before starting the procedure. Time-out: I initiated and conducted the "Time-out" before starting the procedure, as per protocol. The patient was asked to participate by confirming the accuracy of the "Time Out" information. Verification of the correct person, site, and procedure were performed and confirmed by me, the nursing staff, and the patient. "Time-out" conducted as per Joint Commission's Universal Protocol (UP.01.01.01). Time: 1150  Description of Procedure:                              Vitals:   12/20/23 1136  BP: (!) 108/52  Pulse: 72  Temp: (!) 97.3 F (36.3 C)  SpO2: 97%  Weight: 211 lb (95.7 kg)  Height: 5\' 7"  (1.702 m)      Start Time: 1150 hrs. End Time: 1230 hrs.  Qutenza patch applied to the plantar and dorsal surface of the right foot.     Post-operative Assessment:  Post-procedure Vital Signs:  Pulse/HCG Rate: 72  Temp:  (!) 97.3 F (36.3 C) Resp:   BP:  (!) 108/52 SpO2: 97 %  EBL: None  Complications: No immediate post-treatment complications observed by team, or reported by patient.  Note: The patient tolerated the entire procedure well. A repeat set of vitals were taken after the procedure and the patient was kept under observation following institutional policy, for this type of procedure. Post-procedural neurological assessment was performed, showing return to baseline, prior to discharge. The patient was provided with post-procedure discharge instructions, including a section on how to identify potential problems. Should any problems arise concerning this procedure, the patient was given instructions to immediately contact us, at any time, without  hesitation. In any case, we plan to contact the patient by telephone for a follow-up status report regarding this interventional procedure.  Comments:  No additional relevant information.  Plan of Care     Medications ordered for procedure:  Meds ordered this encounter  Medications   capsaicin topical system 8 % patch 4 patch   Medications administered: We administered capsaicin topical system.  See the medical record for exact dosing, route, and time of administration.  Follow-up plan:   Return in 3 months (on 03/21/2024) for Qutenza (right foot).       Qutenza 03/09/22, 06/15/22, 10/26/22, 12/20/22   Recent Visits Date Type Provider Dept  11/23/23 Office Visit Edward Jolly, MD [facility]  Showing recent visits within past 90 days and meeting all other requirements Today's Visits Date Type Provider Dept  12/20/23 Procedure visit Edward Jolly, MD [facility]  Showing today's visits and meeting all other requirements Future Appointments No visits were found meeting these conditions. Showing future appointments within next 90 days and meeting all other requirements  Disposition: Discharge home  Discharge (Date  Time): 12/20/2023; 1239 hrs.   Primary Care Physician: [name], PA-C Location: [facility] Note by: Edward Jolly, MD Date: 12/20/2023; Time: 12:48 PM  Disclaimer:  Medicine is not an exact science. The only guarantee in medicine is that nothing is guaranteed. It is important to note that the decision to proceed with this intervention was based on the information collected from the patient. The Data and conclusions were drawn from the patient's questionnaire, the interview, and the physical examination. Because the information was provided in large part by the patient, it cannot be guaranteed that it has not been purposely or unconsciously manipulated. Every effort has been made to obtain as much relevant data as possible  for this evaluation. It is important to note that the conclusions that lead to this procedure are derived in large part from the available data. Always take into account that the treatment will also be dependent on availability of resources and existing treatment guidelines, considered by other Pain Management Practitioners as being common knowledge and practice, at the time of the intervention. For Medico-Legal purposes, it is also important to point out that variation in procedural techniques and pharmacological choices are the acceptable norm. The indications, contraindications, technique, and results of the above procedure should only be interpreted and judged by a Board-Certified Interventional Pain Specialist with extensive familiarity and expertise in the same exact procedure and technique.

## 2023-12-20 NOTE — Patient Instructions (Signed)

## 2023-12-20 NOTE — Progress Notes (Signed)
 Safety precautions to be maintained throughout the outpatient stay will include: orient to surroundings, keep bed in low position, maintain call bell within reach at all times, provide assistance with transfer out of bed and ambulation.

## 2023-12-21 ENCOUNTER — Telehealth: Payer: Self-pay

## 2023-12-21 NOTE — Telephone Encounter (Signed)
 No answer and no voicemail available. Attempt to contact for post-procedure f/u.

## 2023-12-27 DIAGNOSIS — I25118 Atherosclerotic heart disease of native coronary artery with other forms of angina pectoris: Secondary | ICD-10-CM | POA: Diagnosis not present

## 2023-12-27 DIAGNOSIS — I5032 Chronic diastolic (congestive) heart failure: Secondary | ICD-10-CM | POA: Diagnosis not present

## 2023-12-27 DIAGNOSIS — I10 Essential (primary) hypertension: Secondary | ICD-10-CM | POA: Diagnosis not present

## 2023-12-27 DIAGNOSIS — Z86711 Personal history of pulmonary embolism: Secondary | ICD-10-CM | POA: Diagnosis not present

## 2023-12-29 ENCOUNTER — Ambulatory Visit: Payer: BC Managed Care – PPO | Admitting: Nurse Practitioner

## 2023-12-29 ENCOUNTER — Encounter: Payer: Self-pay | Admitting: Nurse Practitioner

## 2023-12-29 VITALS — BP 90/60 | HR 77 | Temp 97.6°F | Ht 67.0 in | Wt 208.0 lb

## 2023-12-29 DIAGNOSIS — G4733 Obstructive sleep apnea (adult) (pediatric): Secondary | ICD-10-CM | POA: Diagnosis not present

## 2023-12-29 DIAGNOSIS — L89891 Pressure ulcer of other site, stage 1: Secondary | ICD-10-CM | POA: Diagnosis not present

## 2023-12-29 DIAGNOSIS — L98499 Non-pressure chronic ulcer of skin of other sites with unspecified severity: Secondary | ICD-10-CM | POA: Insufficient documentation

## 2023-12-29 NOTE — Assessment & Plan Note (Signed)
 Moderate OSA on CPAP.  We have had difficulties getting him better controlled and finding a mask that fits appropriately.  Recently changed with significant improvement in AHI.  Excellent control now.  Good compliance.  Decreased usage was primarily related to difficulties with mask fit and has not since improved.  Aware of risks of untreated sleep apnea.  Encouraged continued healthy weight loss measures. He does what appears to be a small pressure ulcer to the bridge of his nose, stage 1.  Skin is intact.  Likely due to mask.  He has had difficulties with alternative masks in the past due to similar reasons for respite.  Advised him to utilize a Mepilex dressing to protect of the skin and assess how mask fits with this.  If he has difficulties with leaking can then try a mask liner instead; although not sure this will give enough cushion to protect the skin.  Provided him with a sample AirFit F40 fullface mask, size medium, to trial at home.  Advised him to notify if he had increased leaks or poor seal with this.  Can consider alternating back-and-forth between this mask and the other mask to relieve pressure on certain areas.  Will reassess at follow-up.  Patient reports was reviewed.  Patient Instructions  Continue to use CPAP every night, minimum of 4-6 hours a night.  Change equipment as directed. Wash your tubing with warm soap and water daily, hang to dry. Wash humidifier portion weekly. Use bottled, distilled water and change daily Be aware of reduced alertness and do not drive or operate heavy machinery if experiencing this or drowsiness.  Exercise encouraged, as tolerated. Healthy weight management discussed.  Avoid or decrease alcohol consumption and medications that make you more sleepy, if possible. Notify if persistent daytime sleepiness occurs even with consistent use of PAP therapy.   Try the new mask I gave you If this doesn't work well for you or seal, go back to your current mask and  wear a mask liner. You can also put a little piece of mepilex foam dressing over the bridge of your nose (you can get these at your local pharmacy or [facility])   Follow up in 6 weeks to check on the sore with Katie Paiden Caraveo,NP. If symptoms do not improve or worsen, please contact office for sooner follow up or seek emergency care.

## 2023-12-29 NOTE — Assessment & Plan Note (Signed)
 See above

## 2023-12-29 NOTE — Patient Instructions (Signed)
 Continue to use CPAP every night, minimum of 4-6 hours a night.  Change equipment as directed. Wash your tubing with warm soap and water daily, hang to dry. Wash humidifier portion weekly. Use bottled, distilled water and change daily Be aware of reduced alertness and do not drive or operate heavy machinery if experiencing this or drowsiness.  Exercise encouraged, as tolerated. Healthy weight management discussed.  Avoid or decrease alcohol consumption and medications that make you more sleepy, if possible. Notify if persistent daytime sleepiness occurs even with consistent use of PAP therapy.   Try the new mask I gave you If this doesn't work well for you or seal, go back to your current mask and wear a mask liner. You can also put a little piece of mepilex foam dressing over the bridge of your nose (you can get these at your local pharmacy or [facility])   Follow up in 6 weeks to check on the sore with Katie Mcgregor Tinnon,NP. If symptoms do not improve or worsen, please contact office for sooner follow up or seek emergency care.

## 2023-12-29 NOTE — Progress Notes (Signed)
 @Patient  ID: Damon English, male    DOB: [birth year], 73 y.o.   MRN: [id number]  Chief Complaint  Patient presents with   Follow-up    Wearing CPAP nightly after new mask was delivered. No problem with mask or pressure. Last night he did get message on CPAP saying "seal was not good."     Referring provider: [name], PA-C  HPI: 73 year old male, former smoker followed for sleep apnea.  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  TEST/EVENTS:  01/03/2023 HST: REI 17.7/h, 100% obstructive; SpO2 low 63% 08/26/2023 CPAP titration outside facility >> optimal setting 11 cm of water, residual AHI 3/h.  Was not evaluated in REM sleep.  03/06/2023: Ov with Envi Eagleson NP for sleep consult, referred by [name], PA.  He had a home sleep study in March 2024 with [facility] which revealed moderate obstructive sleep apnea.  He had been having difficulties with his sleep for many years prior to this.  He wanted to switch care over to [facility] so he asked to have sleep consult here.  He was never started on CPAP therapy after his study and has never been on CPAP in the past.  He continues to have a lot of trouble with his sleep.  Feels tired in the morning; wants to sleep all day.  Significant daytime fatigue symptoms.  Usually has dry mouth when he wakes up.  Has loud snoring.  His wife is noticed that he gasps awake and sounds like he is choking at times.  Has had some sleepiness on longer distance drives but no issues around town and has never fallen asleep while driving. Some sleep talking. No sleep walking/paralysis.  He goes to bed between 9 to 11 PM.  Falls asleep quickly.  Wakes 2 or more times a night.  Usually  gets up around 9:10 AM. No sleep aids.  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  Weight is up 10 to 15 pounds over the last 2 years. He has a significant cardiac history as well as history of stroke and diabetes.  He is a former smoker; quit 10 years ago.  Usually drinks 6-8 beers a day.  He has a past history of marijuana and cocaine use; quit 25 years ago.  No excessive caffeine intake.  Lives with his wife.  He is self-employed.  Has an adult child.  Family history of heart disease and cancer. [place] 6  06/02/2023: OV with Blakely Maranan NP for follow-up after being started on CPAP therapy.  He was diagnosed with COVID mid July.  He did not wear his CPAP for about 3 weeks during this period.  Trying to get back on therapy.  Having some difficulties with mask leakage and fit.  He did switch to a DreamWear fullface mask but does not seem to be making a big difference.  Still not feeling significantly better during the day.  Feels like he is usually tired and wants to sleep during the day.  Denies any recent drowsy driving or morning headaches.  No sleep parasomnia/paralysis. 05/02/2023-05/31/2023 CPAP 5-15 cmH2O 12/30 days; 30% >4 hr; average usage 5 hours 54 minutes Pressure 95th 9.2 Leaks 95th 58.3 AHI 10.3  08/18/2023: Ov with Edvin Albus NP for  follow-up via video visit.  We made adjustments to his CPAP after his last visit due to significant leaks.  He was also not feeling much benefit during the day.  We brought him down to 5-9 cmH2O from 5 to 15 cm water.  He also switched over to an [place] fullface mask, which he is liking.  He actually is feeling better during the day.  Feels like his energy levels have improved.  He has been wearing the CPAP every night.  Does occasionally wake up and has noticed that he is taken it off but this does not happen very often.  Denies any issues with drowsy driving or morning headaches.   He is having some cardiac issues with angina and dyspnea on exertion, which he  is working with his cardiologist on.  No active chest pain. Understands that if symptoms worsen he should go to the emergency room. 07/18/2023-08/16/2023: CPAP 5-9 cmH2O 30/30 days; 87% >4 hr; average use 7 hr 39 min Pressure 95th 8.7 Leaks 95th 32.7 AHI 12.3/h, CAI 0.2   11/17/2023: OV with Clevie Prout NP for follow-up.  Had his CPAP titration study at [facility] 11/9.  Unfortunately we did not receive these records but I was able to review them in care everywhere.  He contacted [place] in December to let us know that he had completed the study.  Settings were adjusted to 11 to 15 cm water.  This has been working relatively well for him; however the aborta fullface mask is causing a recurrent ulcer on his upper gum.  His dentist was the one who notices first.  It is tender.  Has difficulties getting it to go away.  Ulcer is right where his mask presses above upper lip.  He has not been able to tolerate nasal masks in the past because he keeps mouth breathing.  Chinstrap did not help.  Would like to move to a regular fullface mask. Otherwise, he does feel like the new settings are helping.  Feels like he is a little bit better rested and sleeping a little more restfully.  He is currently on Ozempic and noticed that when the titrated his dose up a few weeks ago, it has made him a little more fatigued but otherwise doing okay.  No issues with drowsy driving or morning headaches.  Still having some leaks on current settings. 10/16/2024-11/15/2023: CPAP 11 to 15 cmH2O 20/30 days; 53% >4 hr; average use 4 hours 57 minutes Pressure 95th 11.2 Leaks 95th 53.6 AHI 5.6  12/29/2023: Today - follow up Patient presents today for follow-up.  At his last visit we changed him to an AirFit F20 mask due to pressure ulcer that he has started develop on his's and difficulties with seal/leaks.  He feels like this is working much better for him.  Seems to be feeling better.  He did just started over the last couple of weeks because they were  having difficulties figuring out exactly how to work the mask and had trouble getting in touch with adapt.  He has been wearing it consistently for the past few nights.  Seal has been good.  Feels like he has been resting well.  Does feel like his energy levels are better during the day.  Denies any issues with drowsy driving or morning headaches.   He does have a sore on the bridge of his nose that he has developed since switching the mask.  No exudate.  Skin is intact right now.  No tenderness. Breathing has  been better since his cardiologist adjusted his fluid pill.  No issues with cough or wheezing.  No leg swelling or weight gain.  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  Is on NOAC for now.  No ASA with it. Not allergic per patient.  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  Is on NOAC for now.  No ASA with it. Not allergic per patient.   Per pt not an allergy    Immunization History  Administered Date(s) Administered   Influenza Split 08/21/2012   Influenza, Quadrivalent, Recombinant, Inj, Pf 11/08/2017, 08/08/2018, 09/09/2019   Influenza, Seasonal, Injecte, Preservative Fre 07/17/2018   Influenza,inj,Quad PF,6+ Mos 11/01/2016   Influenza,inj,quad, With Preservative 11/12/2015   Influenza-Unspecified 07/23/2013, 07/27/2021, 07/17/2022   PFIZER Comirnaty(Gray Top)Covid-19 Tri-Sucrose Vaccine 10/30/2020   PFIZER(Purple  Top)SARS-COV-2 Vaccination 12/16/2019, 01/13/2020   Pneumococcal Polysaccharide-23 01/29/2019   Tdap 03/20/2006    Past Medical History:  Diagnosis Date   Alcohol use    "cutting back" but still heavy and daily, multiple shots of bourbon each night   Diabetes mellitus without complication (HCC)    History of tobacco use    reportedly quit in 2013   Hypertension    Pulmonary embolism (HCC) 2012   unprovoked   PVD (peripheral vascular disease) (HCC)     Tobacco History: Social History   Tobacco Use  Smoking Status Former   Current packs/day: 0.00   Average packs/day: 2.0 packs/day for 40.0 years (80.0 ttl pk-yrs)   Types: Cigarettes   Start date: 11/27/1970   Quit date: 11/27/2010   Years since quitting: 13.0  Smokeless Tobacco Never  Tobacco Comments   Quit in 2012   Counseling given: Not Answered Tobacco comments: Quit in 2012   Outpatient Medications Prior to Visit  Medication Sig Dispense Refill   acetaminophen (TYLENOL) 325 MG tablet Take 2 tablets (650 mg total) by mouth every 6 (six) hours as needed for fever. 20 tablet 0   apixaban (ELIQUIS) 5 MG TABS tablet Take by mouth.     atorvastatin (LIPITOR) 80 MG tablet Take 80 mg by mouth daily.     clonazePAM (KLONOPIN) 0.25 MG disintegrating tablet Take 1-2 tablets (0.25-0.5 mg total) by mouth 3 (three) times daily as needed (anxiety/agitation). 20 tablet 0   clonazePAM (KLONOPIN) 0.5 MG tablet Take 0.5 mg by mouth 2 (two) times daily as needed.     clopidogrel (PLAVIX) 75 MG tablet Take 1 tablet (75 mg total) by mouth daily. 30 tablet 0   diclofenac Sodium (VOLTAREN) 1 % GEL Apply 4 g topically 4 (four) times daily.     DULoxetine (CYMBALTA) 30 MG capsule Take 30 mg by mouth daily.     famotidine (PEPCID) 40 MG tablet Take 1 tablet by mouth every morning.     furosemide (LASIX) 20 MG tablet Take 40 mg by mouth daily as needed (Edema (Or weight gain of >2lbs overnight or 5lbs in one week)).  glipiZIDE (GLUCOTROL) 10  MG tablet Take 5 mg by mouth as needed.     JARDIANCE 10 MG TABS tablet Take 10 mg by mouth daily.     latanoprost (XALATAN) 0.005 % ophthalmic solution Place 1 drop into both eyes at bedtime. 2.5 mL 12   losartan (COZAAR) 25 MG tablet Take 25 mg by mouth daily.     metoprolol succinate (TOPROL-XL) 25 MG 24 hr tablet Take 25 mg by mouth at bedtime.     nitroGLYCERIN (NITROSTAT) 0.4 MG SL tablet Place 1 tablet (0.4 mg total) under the tongue every 5 (five) minutes as needed for chest pain. 20 tablet 12   OZEMPIC, 1 MG/DOSE, 4 MG/3ML SOPN Inject 1 mg into the skin once a week.     pantoprazole (PROTONIX) 40 MG tablet Take 40 mg by mouth daily.     polyethylene glycol (MIRALAX / GLYCOLAX) 17 g packet Take 17 g by mouth daily as needed for mild constipation or moderate constipation. 14 each 0   potassium chloride (MICRO-K) 10 MEQ CR capsule Take 20 mEq by mouth daily.     ranolazine (RANEXA) 500 MG 12 hr tablet Take 500 mg by mouth 2 (two) times daily.     SENNOSIDES PO Take by mouth.     spironolactone (ALDACTONE) 25 MG tablet Take 25 mg by mouth daily.     thiamine (VITAMIN B1) 100 MG tablet Take 100 mg by mouth daily.     torsemide (DEMADEX) 20 MG tablet Take 40 mg by mouth daily.     triamcinolone (KENALOG) 0.1 % paste Use as directed 1 Application in the mouth or throat 2 (two) times daily. Until healed 20 g 0   triamcinolone cream (KENALOG) 0.1 % APPLY TWICE DAILY TO RASH ON LEGS UP TO 2 WEEKS/MONTH AS NEEDED.     gabapentin (NEURONTIN) 100 MG capsule Take 1-2 capsules (100-200 mg total) by mouth 2 (two) times daily. Follow written titration schedule 120 capsule 5   Semaglutide,0.25 or 0.5MG /DOS, 2 MG/3ML SOPN Inject into the skin. (Patient not taking: Reported on 12/29/2023)     No facility-administered medications prior to visit.     Review of Systems:   Constitutional: No night sweats, fevers, chills, or lassitude. +daytime fatigue (improving), intentional weight loss HEENT: No  headaches, difficulty swallowing, tooth/dental problems, or sore throat. No sneezing, itching, ear ache, nasal congestion, or post nasal drip. +mouth sore (resolved) CV:  No chest pain, orthopnea, PND, swelling in lower extremities, anasarca, dizziness, palpitations, syncope Resp: +snoring, witnessed apneas (without CPAP); shortness of breath with exertion (improved; stable). No excess mucus or change in color of mucus. No productive or non-productive. No hemoptysis. No wheezing.  No chest wall deformity GI:  +occasional heartburn, indigestion. No abdominal pain, nausea, vomiting, diarrhea, change in bowel habits, loss of appetite, bloody stools.  GU: No dysuria, change in color of urine, urgency or frequency, nocturia  Skin: No rash, lesions. +easily bruised (on chronic AC); ulceration to bridge of nose MSK:  +chronic joint pains. No joint swelling.   Neuro: No dizziness or lightheadedness.  Psych: +depression, anxiety (stable; baseline). No SI/HI. Mood stable. +sleep disturbance (resolved with CPAP)    Physical Exam:  BP 90/60 (BP Location: Left Arm, Patient Position: Sitting, Cuff Size: Normal)   Pulse 77   Temp 97.6 F (36.4 C) (Temporal)   Ht 5\' 7"  (1.702 m)   Wt 208 lb (94.3 kg)   SpO2 96%   BMI 32.58 kg/m   GEN: Pleasant,  interactive, well-kempt; obese; in no acute distress. HEENT:  Normocephalic and atraumatic. PERRLA. Sclera white. Nasal turbinates pink, moist and patent bilaterally. No rhinorrhea present. Oropharynx pink and moist, without exudate or edema. No postnasal drip, ulcerations, lesions to oral mucosa. Mallampati III.   NECK:  Supple w/ fair ROM. Thyroid symmetrical with no goiter or nodules palpated. No lymphadenopathy.   CV: RRR, no m/r/g, no peripheral edema. Pulses intact, +2 bilaterally. No cyanosis, pallor or clubbing. PULMONARY:  Unlabored, regular breathing. Clear bilaterally A&P w/o wheezes/rales/rhonchi. No accessory muscle use.  GI: BS present and  normoactive. Soft, non-tender to palpation. No organomegaly or masses detected. MSK: No erythema, warmth or tenderness. Cap refil <2 sec all extrem. No deformities or joint swelling noted.  Neuro: A/Ox3. No focal deficits noted.   Skin: Warm, no lesions or rashes. Pink ulceration to bridge of nose; blanchable; no exudate  Psych: Normal affect and behavior. Judgement and thought content appropriate.     Lab Results:  CBC    Component Value Date/Time   WBC 9.9 07/11/2021 1317   RBC 4.29 07/11/2021 1317   HGB 13.6 07/11/2021 1317   HGB 16.1 11/08/2013 1530   HCT 41.1 07/11/2021 1317   HCT 46.4 11/08/2013 1530   PLT 158 07/11/2021 1317   PLT 173 11/08/2013 1530   MCV 95.8 07/11/2021 1317   MCV 104 (H) 11/08/2013 1530   MCH 31.7 07/11/2021 1317   MCHC 33.1 07/11/2021 1317   RDW 16.1 (H) 07/11/2021 1317   RDW 12.3 11/08/2013 1530   LYMPHSABS 1.4 07/11/2021 1317   MONOABS 0.9 07/11/2021 1317   EOSABS 0.1 07/11/2021 1317   BASOSABS 0.1 07/11/2021 1317    BMET    Component Value Date/Time   NA 135 07/11/2021 1317   NA 136 11/08/2013 1530   K 4.2 07/11/2021 1317   K 3.4 (L) 11/08/2013 1530   CL 100 07/11/2021 1317   CL 102 11/08/2013 1530   CO2 21 (L) 07/11/2021 1317   CO2 27 11/08/2013 1530   GLUCOSE 139 (H) 07/11/2021 1317   GLUCOSE 150 (H) 11/08/2013 1530   BUN 30 (H) 11/04/2021 1026   BUN 16 11/08/2013 1530   CREATININE 1.53 (H) 11/04/2021 1026   CREATININE 1.07 11/08/2013 1530   CALCIUM 9.5 07/11/2021 1317   CALCIUM 9.3 11/08/2013 1530   GFRNONAA 49 (L) 11/04/2021 1026   GFRNONAA >60 11/08/2013 1530   GFRAA >60 01/30/2019 0652   GFRAA >60 11/08/2013 1530    BNP    Component Value Date/Time   BNP 218.5 (H) 07/11/2021 1317     Imaging:  VAS [place] ABI WITH/WO TBI Result Date: 12/19/2023  LOWER EXTREMITY DOPPLER STUDY Patient Name:  CRECENCIO KWIATEK  Date of Exam:   12/15/2023 Medical Rec #: [id number]         Accession #:    [id number] Date of Birth: 1951/04/29           Patient Gender: M Patient Age:   5 years Exam Location:  [facility] Procedure:      VAS [place] ABI WITH/WO TBI Referring Phys: --------------------------------------------------------------------------------  Indications: Peripheral artery disease.  Vascular Interventions: 01/14/21: Bilateral CIA & right EIA stents;                         11/04/21: Left SFA-popliteal stent;. Performing Technologist: Jamse Mead RT, RDMS, RVT  Examination Guidelines: A complete evaluation includes at minimum, Doppler waveform signals and systolic blood pressure  reading at the level of bilateral brachial, anterior tibial, and posterior tibial arteries, when vessel segments are accessible. Bilateral testing is considered an integral part of a complete examination. Photoelectric Plethysmograph (PPG) waveforms and toe systolic pressure readings are included as required and additional duplex testing as needed. Limited examinations for reoccurring indications may be performed as noted.  ABI Findings: +---------+------------------+-----+--------+-------+ Right    Rt Pressure (mmHg)IndexWaveformComment +---------+------------------+-----+--------+-------+ Brachial 101                                    +---------+------------------+-----+--------+-------+ PTA      117               1.16 biphasic        +---------+------------------+-----+--------+-------+ DP       95                0.94 biphasic        +---------+------------------+-----+--------+-------+ Great Toe101               1.00 Normal          +---------+------------------+-----+--------+-------+ +---------+------------------+-----+--------+-------+ Left     Lt Pressure (mmHg)IndexWaveformComment +---------+------------------+-----+--------+-------+ Brachial 97                                     +---------+------------------+-----+--------+-------+ PTA      121               1.20 biphasic         +---------+------------------+-----+--------+-------+ DP       116               1.15 biphasic        +---------+------------------+-----+--------+-------+ Great Toe75                0.74 Normal          +---------+------------------+-----+--------+-------+ +-------+-----------+-----------+------------+------------+ ABI/TBIToday's ABIToday's TBIPrevious ABIPrevious TBI +-------+-----------+-----------+------------+------------+ Right  1.16       1.0        1.11        0.62         +-------+-----------+-----------+------------+------------+ Left   1.2        0.74       1.08        0.61         +-------+-----------+-----------+------------+------------+ Bilateral ABIs appear essentially unchanged compared to prior study on 06/09/23.  Summary: Bilateral: Bilateral ankle-brachial indexes are within normal range. Bilateral toe-brachial indexes are within normal range. *See table(s) above for measurements and observations.  Electronically signed by Festus Barren MD on 12/19/2023 at 8:05:12 AM.    Final     capsaicin topical system 8 % patch 4 patch     Date Action Dose Route User   12/20/2023 1150 Given 2 patch Topical Newman Pies, RN           No data to display          No results found for: "NITRICOXIDE"      Assessment & Plan:   Moderate obstructive sleep apnea Moderate OSA on CPAP.  We have had difficulties getting him better controlled and finding a mask that fits appropriately.  Recently changed with significant improvement in AHI.  Excellent control now.  Good compliance.  Decreased usage was primarily related to difficulties with mask fit and has not since improved.  Aware of  risks of untreated sleep apnea.  Encouraged continued healthy weight loss measures. He does what appears to be a small pressure ulcer to the bridge of his nose, stage 1.  Skin is intact.  Likely due to mask.  He has had difficulties with alternative masks in the past due to similar  reasons for respite.  Advised him to utilize a Mepilex dressing to protect of the skin and assess how mask fits with this.  If he has difficulties with leaking can then try a mask liner instead; although not sure this will give enough cushion to protect the skin.  Provided him with a sample AirFit F40 fullface mask, size medium, to trial at home.  Advised him to notify if he had increased leaks or poor seal with this.  Can consider alternating back-and-forth between this mask and the other mask to relieve pressure on certain areas.  Will reassess at follow-up.  Patient reports was reviewed.  Patient Instructions  Continue to use CPAP every night, minimum of 4-6 hours a night.  Change equipment as directed. Wash your tubing with warm soap and water daily, hang to dry. Wash humidifier portion weekly. Use bottled, distilled water and change daily Be aware of reduced alertness and do not drive or operate heavy machinery if experiencing this or drowsiness.  Exercise encouraged, as tolerated. Healthy weight management discussed.  Avoid or decrease alcohol consumption and medications that make you more sleepy, if possible. Notify if persistent daytime sleepiness occurs even with consistent use of PAP therapy.   Try the new mask I gave you If this doesn't work well for you or seal, go back to your current mask and wear a mask liner. You can also put a little piece of mepilex foam dressing over the bridge of your nose (you can get these at your local pharmacy or [facility])   Follow up in 6 weeks to check on the sore with Katie Shammond Arave,NP. If symptoms do not improve or worsen, please contact office for sooner follow up or seek emergency care.    Pressure ulcer See above     I spent 35 minutes of dedicated to the care of this patient on the date of this encounter to include pre-visit review of records, face-to-face time with the patient discussing conditions above, post visit ordering of testing, clinical  documentation with the electronic health record, making appropriate referrals as documented, and communicating necessary findings to members of the patients care team.  Noemi Chapel, NP 12/29/2023  Pt aware and understands NP's role.

## 2024-01-05 DIAGNOSIS — S8001XA Contusion of right knee, initial encounter: Secondary | ICD-10-CM | POA: Diagnosis not present

## 2024-01-05 DIAGNOSIS — W010XXA Fall on same level from slipping, tripping and stumbling without subsequent striking against object, initial encounter: Secondary | ICD-10-CM | POA: Diagnosis not present

## 2024-01-05 DIAGNOSIS — M25561 Pain in right knee: Secondary | ICD-10-CM | POA: Diagnosis not present

## 2024-01-11 DIAGNOSIS — I509 Heart failure, unspecified: Secondary | ICD-10-CM | POA: Diagnosis not present

## 2024-01-11 DIAGNOSIS — F419 Anxiety disorder, unspecified: Secondary | ICD-10-CM | POA: Diagnosis not present

## 2024-01-11 DIAGNOSIS — Z86718 Personal history of other venous thrombosis and embolism: Secondary | ICD-10-CM | POA: Diagnosis not present

## 2024-01-11 DIAGNOSIS — M25561 Pain in right knee: Secondary | ICD-10-CM | POA: Diagnosis not present

## 2024-01-17 DIAGNOSIS — I5022 Chronic systolic (congestive) heart failure: Secondary | ICD-10-CM | POA: Diagnosis not present

## 2024-01-17 DIAGNOSIS — I110 Hypertensive heart disease with heart failure: Secondary | ICD-10-CM | POA: Diagnosis not present

## 2024-01-17 DIAGNOSIS — I2510 Atherosclerotic heart disease of native coronary artery without angina pectoris: Secondary | ICD-10-CM | POA: Diagnosis not present

## 2024-01-17 DIAGNOSIS — G4733 Obstructive sleep apnea (adult) (pediatric): Secondary | ICD-10-CM | POA: Diagnosis not present

## 2024-01-17 DIAGNOSIS — I4901 Ventricular fibrillation: Secondary | ICD-10-CM | POA: Diagnosis not present

## 2024-02-06 DIAGNOSIS — N1832 Chronic kidney disease, stage 3b: Secondary | ICD-10-CM | POA: Diagnosis not present

## 2024-02-06 DIAGNOSIS — Z86718 Personal history of other venous thrombosis and embolism: Secondary | ICD-10-CM | POA: Diagnosis not present

## 2024-02-06 DIAGNOSIS — Z79899 Other long term (current) drug therapy: Secondary | ICD-10-CM | POA: Diagnosis not present

## 2024-02-06 DIAGNOSIS — E119 Type 2 diabetes mellitus without complications: Secondary | ICD-10-CM | POA: Diagnosis not present

## 2024-02-06 DIAGNOSIS — Z125 Encounter for screening for malignant neoplasm of prostate: Secondary | ICD-10-CM | POA: Diagnosis not present

## 2024-02-06 DIAGNOSIS — I739 Peripheral vascular disease, unspecified: Secondary | ICD-10-CM | POA: Diagnosis not present

## 2024-02-09 ENCOUNTER — Ambulatory Visit: Admitting: Nurse Practitioner

## 2024-02-09 DIAGNOSIS — I10 Essential (primary) hypertension: Secondary | ICD-10-CM | POA: Diagnosis not present

## 2024-02-09 DIAGNOSIS — J069 Acute upper respiratory infection, unspecified: Secondary | ICD-10-CM | POA: Diagnosis not present

## 2024-02-16 ENCOUNTER — Ambulatory Visit: Admitting: Nurse Practitioner

## 2024-02-20 DIAGNOSIS — G4733 Obstructive sleep apnea (adult) (pediatric): Secondary | ICD-10-CM | POA: Diagnosis not present

## 2024-02-28 DIAGNOSIS — D2239 Melanocytic nevi of other parts of face: Secondary | ICD-10-CM | POA: Diagnosis not present

## 2024-02-28 DIAGNOSIS — D225 Melanocytic nevi of trunk: Secondary | ICD-10-CM | POA: Diagnosis not present

## 2024-02-28 DIAGNOSIS — L400 Psoriasis vulgaris: Secondary | ICD-10-CM | POA: Diagnosis not present

## 2024-02-28 DIAGNOSIS — I872 Venous insufficiency (chronic) (peripheral): Secondary | ICD-10-CM | POA: Diagnosis not present

## 2024-02-29 DIAGNOSIS — Z79899 Other long term (current) drug therapy: Secondary | ICD-10-CM | POA: Diagnosis not present

## 2024-03-05 ENCOUNTER — Encounter (INDEPENDENT_AMBULATORY_CARE_PROVIDER_SITE_OTHER): Payer: Self-pay

## 2024-03-06 ENCOUNTER — Encounter
Admission: RE | Disposition: A | Discharge status: Home / Self Care | Payer: Self-pay | Source: Home / Self Care | Attending: Internal Medicine

## 2024-03-06 ENCOUNTER — Ambulatory Visit: Admitting: Anesthesiology

## 2024-03-06 ENCOUNTER — Ambulatory Visit
Admission: RE | Admit: 2024-03-06 | Discharge: 2024-03-06 | Disposition: A | Discharge status: Home / Self Care | Attending: Internal Medicine | Admitting: Internal Medicine

## 2024-03-06 DIAGNOSIS — K5730 Diverticulosis of large intestine without perforation or abscess without bleeding: Secondary | ICD-10-CM | POA: Diagnosis not present

## 2024-03-06 DIAGNOSIS — K649 Unspecified hemorrhoids: Secondary | ICD-10-CM | POA: Diagnosis not present

## 2024-03-06 DIAGNOSIS — Z87891 Personal history of nicotine dependence: Secondary | ICD-10-CM | POA: Diagnosis not present

## 2024-03-06 DIAGNOSIS — Z993 Dependence on wheelchair: Secondary | ICD-10-CM | POA: Diagnosis not present

## 2024-03-06 DIAGNOSIS — K219 Gastro-esophageal reflux disease without esophagitis: Secondary | ICD-10-CM | POA: Diagnosis not present

## 2024-03-06 DIAGNOSIS — Z86718 Personal history of other venous thrombosis and embolism: Secondary | ICD-10-CM | POA: Insufficient documentation

## 2024-03-06 DIAGNOSIS — Z79899 Other long term (current) drug therapy: Secondary | ICD-10-CM | POA: Insufficient documentation

## 2024-03-06 DIAGNOSIS — Z860101 Personal history of adenomatous and serrated colon polyps: Secondary | ICD-10-CM | POA: Diagnosis not present

## 2024-03-06 DIAGNOSIS — K317 Polyp of stomach and duodenum: Secondary | ICD-10-CM | POA: Insufficient documentation

## 2024-03-06 DIAGNOSIS — I2510 Atherosclerotic heart disease of native coronary artery without angina pectoris: Secondary | ICD-10-CM | POA: Insufficient documentation

## 2024-03-06 DIAGNOSIS — Z6832 Body mass index (BMI) 32.0-32.9, adult: Secondary | ICD-10-CM | POA: Insufficient documentation

## 2024-03-06 DIAGNOSIS — K635 Polyp of colon: Secondary | ICD-10-CM | POA: Diagnosis not present

## 2024-03-06 DIAGNOSIS — I10 Essential (primary) hypertension: Secondary | ICD-10-CM | POA: Diagnosis not present

## 2024-03-06 DIAGNOSIS — Z7984 Long term (current) use of oral hypoglycemic drugs: Secondary | ICD-10-CM | POA: Diagnosis not present

## 2024-03-06 DIAGNOSIS — Z1211 Encounter for screening for malignant neoplasm of colon: Secondary | ICD-10-CM | POA: Diagnosis not present

## 2024-03-06 DIAGNOSIS — Z7985 Long-term (current) use of injectable non-insulin antidiabetic drugs: Secondary | ICD-10-CM | POA: Diagnosis not present

## 2024-03-06 DIAGNOSIS — E669 Obesity, unspecified: Secondary | ICD-10-CM | POA: Insufficient documentation

## 2024-03-06 DIAGNOSIS — E1151 Type 2 diabetes mellitus with diabetic peripheral angiopathy without gangrene: Secondary | ICD-10-CM | POA: Diagnosis not present

## 2024-03-06 DIAGNOSIS — Z950 Presence of cardiac pacemaker: Secondary | ICD-10-CM | POA: Insufficient documentation

## 2024-03-06 DIAGNOSIS — D124 Benign neoplasm of descending colon: Secondary | ICD-10-CM | POA: Diagnosis not present

## 2024-03-06 DIAGNOSIS — Z951 Presence of aortocoronary bypass graft: Secondary | ICD-10-CM | POA: Diagnosis not present

## 2024-03-06 DIAGNOSIS — I442 Atrioventricular block, complete: Secondary | ICD-10-CM | POA: Diagnosis not present

## 2024-03-06 DIAGNOSIS — K640 First degree hemorrhoids: Secondary | ICD-10-CM | POA: Diagnosis not present

## 2024-03-06 HISTORY — PX: ESOPHAGOGASTRODUODENOSCOPY: SHX5428

## 2024-03-06 HISTORY — PX: COLONOSCOPY: SHX5424

## 2024-03-06 HISTORY — PX: POLYPECTOMY: SHX149

## 2024-03-06 LAB — GLUCOSE, CAPILLARY: Glucose-Capillary: 105 mg/dL — ABNORMAL HIGH (ref 70–99)

## 2024-03-06 SURGERY — COLONOSCOPY
Anesthesia: General

## 2024-03-06 MED ORDER — PROPOFOL 500 MG/50ML IV EMUL
INTRAVENOUS | Status: DC | PRN
  Administered 2024-03-06: 150 ug/kg/min via INTRAVENOUS

## 2024-03-06 MED ORDER — SODIUM CHLORIDE 0.9 % IV SOLN
INTRAVENOUS | Status: DC
  Administered 2024-03-06: 20 mL/h via INTRAVENOUS

## 2024-03-06 MED ORDER — LIDOCAINE HCL (CARDIAC) PF 100 MG/5ML IV SOSY
PREFILLED_SYRINGE | INTRAVENOUS | Status: DC | PRN
  Administered 2024-03-06: 100 mg via INTRAVENOUS

## 2024-03-06 MED ORDER — PROPOFOL 10 MG/ML IV BOLUS
INTRAVENOUS | Status: DC | PRN
  Administered 2024-03-06: 90 mg via INTRAVENOUS

## 2024-03-06 MED ORDER — DEXMEDETOMIDINE HCL IN NACL 80 MCG/20ML IV SOLN
INTRAVENOUS | Status: DC | PRN
  Administered 2024-03-06: 12 ug via INTRAVENOUS

## 2024-03-06 MED ORDER — PHENYLEPHRINE 80 MCG/ML (10ML) SYRINGE FOR IV PUSH (FOR BLOOD PRESSURE SUPPORT)
PREFILLED_SYRINGE | INTRAVENOUS | Status: DC | PRN
  Administered 2024-03-06 (×2): 80 ug via INTRAVENOUS

## 2024-03-06 NOTE — Transfer of Care (Signed)
 Immediate Anesthesia Transfer of Care Note  Patient: Damon English  Procedure(s) Performed: COLONOSCOPY EGD (ESOPHAGOGASTRODUODENOSCOPY) POLYPECTOMY, INTESTINE  Patient Location: PACU and Endoscopy Unit  Anesthesia Type:General  Level of Consciousness: sedated  Airway & Oxygen Therapy: Patient Spontanous Breathing  Post-op Assessment: Report given to RN and Post -op Vital signs reviewed and stable  Post vital signs: Reviewed and stable  Last Vitals:  Vitals Value Taken Time  BP 100/56 03/06/24 1121  Temp 36.1 C 03/06/24 1118  Pulse 65 03/06/24 1122  Resp 17 03/06/24 1122  SpO2 97 % 03/06/24 1122  Vitals shown include unfiled device data.  Last Pain:  Vitals:   03/06/24 1118  TempSrc: Temporal  PainSc: Asleep         Complications: No notable events documented.

## 2024-03-06 NOTE — Op Note (Signed)
 [facility] Gastroenterology Patient Name: Damon English Procedure Date: 03/06/2024 10:27 AM MRN: [id number] Account #: [id number] Date of Birth: 20-Jun-1951 Admit Type: Outpatient Age: 73 Room: [facility] ENDO ROOM 3 Gender: Male Note Status: Finalized Instrument Name: Almyra Jain [id number] Procedure:             Upper GI endoscopy Indications:           Gastro-esophageal reflux disease Providers:             Bernestine Holsapple K. Sundai Probert MD, MD Medicines:             Propofol  per Anesthesia Complications:         No immediate complications. Estimated blood loss:                         Minimal. Procedure:             Pre-Anesthesia Assessment:                        - The risks and benefits of the procedure and the                         sedation options and risks were discussed with the                         patient. All questions were answered and informed                         consent was obtained.                        - Patient identification and proposed procedure were                         verified prior to the procedure by the nurse. The                         procedure was verified in the procedure room.                        - ASA Grade Assessment: III - A patient with severe                         systemic disease.                        - After reviewing the risks and benefits, the patient                         was deemed in satisfactory condition to undergo the                         procedure.                        After obtaining informed consent, the endoscope was                         passed under direct vision. Throughout the procedure,  the patient's blood pressure, pulse, and oxygen                         saturations were monitored continuously. The Endoscope                         was introduced through the mouth, and advanced to the                         third part of duodenum. The upper GI endoscopy was                          accomplished without difficulty. The patient tolerated                         the procedure well. Findings:      The esophagus was normal.      Multiple 5 to 17 mm pedunculated and sessile polyps with no bleeding and       no stigmata of recent bleeding were found in the cardia and in the       gastric body. Biopsies were taken with a cold forceps for histology.       Estimated blood loss was minimal.      The exam of the stomach was otherwise normal.      The examined duodenum was normal. Impression:            - Normal esophagus.                        - Multiple gastric polyps. Biopsied.                        - Normal examined duodenum. Recommendation:        - Await pathology results.                        - Proceed with colonoscopy Procedure Code(s):     --- Professional ---                        [phone], Esophagogastroduodenoscopy, flexible,                         transoral; with biopsy, single or multiple Diagnosis Code(s):     --- Professional ---                        K21.9, Gastro-esophageal reflux disease without                         esophagitis                        K31.7, Polyp of stomach and duodenum CPT copyright 2022 American Medical Association. All rights reserved. The codes documented in this report are preliminary and upon coder review may  be revised to meet current compliance requirements. Cassie Click MD, MD 03/06/2024 11:01:39 AM This report has been signed electronically. Number of Addenda: 0 Note Initiated On: 03/06/2024 10:27 AM Estimated Blood Loss:  Estimated blood loss was minimal.      [facility]

## 2024-03-06 NOTE — Anesthesia Preprocedure Evaluation (Addendum)
 Anesthesia Evaluation  Patient identified by MRN, date of birth, ID band Patient awake    Reviewed: Allergy & Precautions, NPO status , Patient's Chart, lab work & pertinent test results  History of Anesthesia Complications Negative for: history of anesthetic complications  Airway Mallampati: IV   Neck ROM: Full    Dental  (+) Missing, Chipped   Pulmonary former smoker (quit 2012)   Pulmonary exam normal breath sounds clear to auscultation       Cardiovascular hypertension, + CAD (s/p CABG) and + Peripheral Vascular Disease (s/p fem pop bypass on Eliquis  and Plavix )  Normal cardiovascular exam+ pacemaker (complete heart block)  Rhythm:Regular Rate:Normal  Hx DVT and PE  Myocardial perfusion 06/23/21:    Findings are consistent with ischemia. The study is high risk.   No ST deviation was noted.   LV perfusion is abnormal. There is evidence of ischemia. Defect 1: There is a large defect with severe reduction in uptake present in the apical to mid anterior and anteroseptal location(s) that is reversible. Consistent with ischemia.   Left ventricular function is abnormal. Nuclear stress EF: 47 %. The left ventricular ejection fraction is mildly decreased (45-54%).  Cardiac cath 07/07/22:  Severe native CAD including total occlusion of the ostial LAD, borderline severe ostial and mid left circumflex stenoses and total occlusion of the right PDA with a patent proximal RCA stent  2/3 patent grafts including patent SVG to the right PDA and a patent LIMA-LAD with a borderline severe instent stenosis LIMA-LAD anastomotic site  Normal hemodynamics including normal filling pressures and normal CO  Recommendations:  Medical therapy for CAD for now given unchanged appearance compared to cath in March 2023  Consider non cardiac causes of his chest pain first. If no clear cause identified, consider PCI of the ostial and mid LCx as next step   Would defer intervention of the LIMA-LAD given risk of loss of antegrade filling of the proximal-mid LAD with anastomotic lesion in stent      Neuro/Psych Alcohol use disorder, 6 beers per day    GI/Hepatic   Endo/Other  diabetes, Type 2  Obesity   Renal/GU      Musculoskeletal   Abdominal   Peds  Hematology   Anesthesia Other Findings Last dose of Ozempic 02/26/24.   Cardiology note 12/27/23:  1. Heart failure with improved ejection fraction (HFimpEF) (CMS/HHS-HCC) - TORsemide (DEMADEX) 20 MG tablet; Take 40mg  daily of torsemide daily other than on Monday, Wednesday, and Friday take 60mg  of torsemide. Dispense: 210 tablet; Refill: 3 - Cancel: Basic Metabolic Panel (BMP); Future - Cancel: Pro-Brain Natriuretic peptide, N-Terminal (NT-pro-BNP); Future - Basic Metabolic Panel (BMP); Future - Pro-Brain Natriuretic peptide, N-Terminal (NT-pro-BNP); Future  2. Coronary artery disease of native artery of native heart with stable angina pectoris (CMS-HCC)  3. Primary hypertension  4. History of pulmonary embolism  CAD S/P CABG x 3 ICM/HFimpEF ICM with prior EF improved to 55%. S/P CABG 02/01/2019 (LIMA-LAD, SVG-OM2, SVG-PDA). PCI to LIMA-LAD graft on 06/28/21. PER PRIOR NOTES: THIS STENT IS AT OR NEAR THE ANASTOMOSIS MAKING ANY APPROACH VIA THE LIMA LIMITED. 12/16/2021 LHC with DES to RCA. Went back to cath lab in Sept 2023 with no changes. Last visit via telehealth noted ongoing DOE but thought d/t volume overload. Switched to torsemide and is feeling much better. I think he might have room to push more on diuresis and will increase torsemide to 60mg  on MWF and continue 40mg  on other days. Labs 2 weeks. Chest  pain is now improved with diuresis. Anginal sx also correlate with fried food. Has upcoming EGD and colonoscopy- okay to pause plavix  for 5 days. Need to continue to encourage decreasing alcohol intake.  - Continues on Plavix  - Continue atorvastatin  80mg  daily - Continue  metoprolol  XL 50mg  daily - Continue losartan  12.5mg  daily - Continue ranolazine  500mg  twice daily - Continue imdur  60mg  daily - Continue Spiro 12.5mg  daily - Continue jardiance 10mg  daily - Increase torsemide to 60mg  MWF and continue 40mg  on all other days + 20meq K - continue ozempic with PCP  Complete Heart Block s/p PPM Follows with EP. Has f/u scheduled  Primary hypertension Blood pressure today is controlled.   PVD (peripheral vascular disease) S/p multiple interventions to bilateral legs. Follows with [facility], Dr Vonna Guardian. Continues on Plavix  and statin.   Mixed hyperlipidemia Last lipid panel below on atorva 80.    Reproductive/Obstetrics                             Anesthesia Physical Anesthesia Plan  ASA: 4  Anesthesia Plan: General   Post-op Pain Management:    Induction: Intravenous  PONV Risk Score and Plan: 2 and Propofol  infusion, TIVA and Treatment may vary due to age or medical condition  Airway Management Planned: Natural Airway  Additional Equipment:   Intra-op Plan:   Post-operative Plan:   Informed Consent: I have reviewed the patients History and Physical, chart, labs and discussed the procedure including the risks, benefits and alternatives for the proposed anesthesia with the patient or authorized representative who has indicated his/her understanding and acceptance.       Plan Discussed with: CRNA  Anesthesia Plan Comments: (LMA/GETA backup discussed.  Patient consented for risks of anesthesia including but not limited to:  - adverse reactions to medications - damage to eyes, teeth, lips or other oral mucosa - nerve damage due to positioning  - sore throat or hoarseness - damage to heart, brain, nerves, lungs, other parts of body or loss of life  Informed patient about role of CRNA in peri- and intra-operative care.  Patient voiced understanding.)        Anesthesia Quick Evaluation

## 2024-03-06 NOTE — Op Note (Signed)
 [facility] Gastroenterology Patient Name: Damon English Procedure Date: 03/06/2024 10:27 AM MRN: [id number] Account #: [id number] Date of Birth: February 11, 1951 Admit Type: Outpatient Age: 73 Room: [facility] ENDO ROOM 3 Gender: Male Note Status: Finalized Instrument Name: Charlyn Cooley [id number] Procedure:             Colonoscopy Indications:           High risk colon cancer surveillance: Personal history                         of non-advanced adenoma Providers:             Yarethzi Branan K. Levante Simones MD, MD Medicines:             Propofol  per Anesthesia Complications:         No immediate complications. Estimated blood loss: None. Procedure:             Pre-Anesthesia Assessment:                        - The risks and benefits of the procedure and the                         sedation options and risks were discussed with the                         patient. All questions were answered and informed                         consent was obtained.                        - Patient identification and proposed procedure were                         verified prior to the procedure by the nurse. The                         procedure was verified in the procedure room.                        - ASA Grade Assessment: III - A patient with severe                         systemic disease.                        - After reviewing the risks and benefits, the patient                         was deemed in satisfactory condition to undergo the                         procedure.                        After obtaining informed consent, the colonoscope was                         passed under direct vision. Throughout the procedure,  the patient's blood pressure, pulse, and oxygen                         saturations were monitored continuously. The                         Colonoscope was introduced through the anus and                         advanced to the the cecum, identified by  appendiceal                         orifice and ileocecal valve. The colonoscopy was                         performed without difficulty. The patient tolerated                         the procedure well. The quality of the bowel                         preparation was adequate. The ileocecal valve,                         appendiceal orifice, and rectum were photographed. Findings:      The perianal and digital rectal examinations were normal. Pertinent       negatives include normal sphincter tone and no palpable rectal lesions.      Non-bleeding internal hemorrhoids were found during retroflexion. The       hemorrhoids were Grade I (internal hemorrhoids that do not prolapse).      A single large-mouthed diverticulum was found in the cecum.      A 6 mm polyp was found in the descending colon. The polyp was sessile.       The polyp was removed with a hot snare. Resection and retrieval were       complete. Estimated blood loss: none.      The exam was otherwise without abnormality. Impression:            - Non-bleeding internal hemorrhoids.                        - Diverticulosis in the cecum.                        - One 6 mm polyp in the descending colon, removed with                         a hot snare. Resected and retrieved.                        - The examination was otherwise normal. Recommendation:        - Patient has a contact number available for                         emergencies. The signs and symptoms of potential                         delayed complications were discussed with the  patient.                         Return to normal activities tomorrow. Written                         discharge instructions were provided to the patient.                        - Await pathology results from EGD, also performed                         today.                        - Resume previous diet.                        - Continue present medications.                        - Resume  Eliquis  (apixaban ) tomorrow and Plavix                          (clopidogrel ) tomorrow at prior doses. Refer to                         managing physician for further adjustment of therapy.                        - If polyps are benign or adenomatous without                         dysplasia, I will advise NO further colonoscopy due to                         advanced age and/or severe comorbidity.                        - Return to my office in 6 months.                        - Telephone GI office to schedule appointment.                        - The findings and recommendations were discussed with                         the patient. Procedure Code(s):     --- Professional ---                        [phone], Colonoscopy, flexible; with removal of                         tumor(s), polyp(s), or other lesion(s) by snare                         technique Diagnosis Code(s):     --- Professional ---  K57.30, Diverticulosis of large intestine without                         perforation or abscess without bleeding                        D12.4, Benign neoplasm of descending colon                        K64.0, First degree hemorrhoids                        Z86.010, Personal history of colonic polyps CPT copyright 2022 American Medical Association. All rights reserved. The codes documented in this report are preliminary and upon coder review may  be revised to meet current compliance requirements. Cassie Click MD, MD 03/06/2024 11:18:07 AM This report has been signed electronically. Number of Addenda: 0 Note Initiated On: 03/06/2024 10:27 AM Scope Withdrawal Time: 0 hours 7 minutes 19 seconds  Total Procedure Duration: 0 hours 10 minutes 11 seconds  Estimated Blood Loss:  Estimated blood loss: none.      [facility]

## 2024-03-06 NOTE — H&P (Signed)
 Outpatient short stay form Pre-procedure 03/06/2024 9:59 AM Noriko Macari K. Corky Diener, M.D.  Primary Physician: Aloha Arnold, PA-C  Reason for visit:  History of adenomatous colon polyps.  History of present illness:  Damon English also known as "Damon English" (entertainment name), presents today after long absence after having a visit with me in June 2024. He had a history of polyps so we had delayed until January 2024. What seem to have required him to come back today and canceled the procedure was his desire to obtain cardiology clearance for his anticoagulation. For some reason our office did not send a clearance request for holding his Plavix  and/or Eliquis  for the January 8 scheduled procedure.  Patient continues to be listed as taking pantoprazole  40 mg daily any complaints of no heartburn as long as he takes his medicine. He has no dysphagia, nausea or vomiting. Patient has some occasional chest pain for which she takes nitroglycerin . He says his cardiologist has told him that even though he could undergo another left heart catheterization and stent placement that it would be "too tricky", so he is managed medically at this time. Patient takes 2-3 nitros per day but can go as long as 3 days without requiring a nitroglycerin  pill. Patient is wheelchair-bound and cannot walk a significant amount without shortness of breath or chest pain.     Current Facility-Administered Medications:    0.9 %  sodium chloride  infusion, , Intravenous, Continuous, Winston Sobczyk K, MD  Medications Prior to Admission  Medication Sig Dispense Refill Last Dose/Taking   acetaminophen  (TYLENOL ) 325 MG tablet Take 2 tablets (650 mg total) by mouth every 6 (six) hours as needed for fever. 20 tablet 0    apixaban  (ELIQUIS ) 5 MG TABS tablet Take by mouth.      atorvastatin  (LIPITOR) 80 MG tablet Take 80 mg by mouth daily.      clonazePAM  (KLONOPIN ) 0.25 MG disintegrating tablet Take 1-2 tablets (0.25-0.5 mg total) by  mouth 3 (three) times daily as needed (anxiety/agitation). 20 tablet 0    clonazePAM  (KLONOPIN ) 0.5 MG tablet Take 0.5 mg by mouth 2 (two) times daily as needed.      clopidogrel  (PLAVIX ) 75 MG tablet Take 1 tablet (75 mg total) by mouth daily. 30 tablet 0    diclofenac Sodium (VOLTAREN) 1 % GEL Apply 4 g topically 4 (four) times daily.      DULoxetine  (CYMBALTA ) 30 MG capsule Take 30 mg by mouth daily.      famotidine  (PEPCID ) 40 MG tablet Take 1 tablet by mouth every morning.      furosemide  (LASIX ) 20 MG tablet Take 40 mg by mouth daily as needed (Edema (Or weight gain of >2lbs overnight or 5lbs in one week)).      gabapentin  (NEURONTIN ) 100 MG capsule Take 1-2 capsules (100-200 mg total) by mouth 2 (two) times daily. Follow written titration schedule 120 capsule 5    glipiZIDE  (GLUCOTROL ) 10 MG tablet Take 5 mg by mouth as needed.      JARDIANCE 10 MG TABS tablet Take 10 mg by mouth daily.      latanoprost  (XALATAN ) 0.005 % ophthalmic solution Place 1 drop into both eyes at bedtime. 2.5 mL 12    losartan  (COZAAR ) 25 MG tablet Take 25 mg by mouth daily.      metoprolol  succinate (TOPROL -XL) 25 MG 24 hr tablet Take 25 mg by mouth at bedtime.      nitroGLYCERIN  (NITROSTAT ) 0.4 MG SL tablet Place 1 tablet (0.4 mg  total) under the tongue every 5 (five) minutes as needed for chest pain. 20 tablet 12    OZEMPIC, 1 MG/DOSE, 4 MG/3ML SOPN Inject 1 mg into the skin once a week.      pantoprazole  (PROTONIX ) 40 MG tablet Take 40 mg by mouth daily.      polyethylene glycol (MIRALAX  / GLYCOLAX ) 17 g packet Take 17 g by mouth daily as needed for mild constipation or moderate constipation. 14 each 0    potassium chloride  (MICRO-K ) 10 MEQ CR capsule Take 20 mEq by mouth daily.      ranolazine  (RANEXA ) 500 MG 12 hr tablet Take 500 mg by mouth 2 (two) times daily.      SENNOSIDES PO Take by mouth.      spironolactone  (ALDACTONE ) 25 MG tablet Take 25 mg by mouth daily.      thiamine  (VITAMIN B1) 100 MG tablet  Take 100 mg by mouth daily.      torsemide (DEMADEX) 20 MG tablet Take 40 mg by mouth daily.      triamcinolone  (KENALOG ) 0.1 % paste Use as directed 1 Application in the mouth or throat 2 (two) times daily. Until healed 20 g 0    triamcinolone  cream (KENALOG ) 0.1 % APPLY TWICE DAILY TO RASH ON LEGS UP TO 2 WEEKS/MONTH AS NEEDED.        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  Is on NOAC for now.  No ASA with it. Not allergic per patient.  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  Is on NOAC for now.  No ASA with it. Not allergic per patient.   Per pt not an allergy     Past Medical History:  Diagnosis Date   Alcohol use    "cutting back" but still heavy and daily, multiple shots of bourbon each night   Diabetes mellitus without complication (HCC)    History of tobacco use    reportedly quit in 2013   Hypertension    Pulmonary embolism (HCC) 2012   unprovoked   PVD (peripheral vascular disease) (HCC)     Review of systems:  Otherwise negative.    Physical Exam  Gen: Alert, oriented. Appears stated age.  HEENT: [place]/AT. PERRLA. Lungs: CTA, no wheezes. CV: RR nl S1, S2. Abd: soft, benign, no masses. BS+ Ext: No edema. Pulses 2+    Planned procedures: Proceed with colonoscopy. The patient understands the nature of the planned procedure, indications, risks, alternatives and potential complications including but not limited to bleeding, infection, perforation, damage to internal organs and possible oversedation/side effects from anesthesia. The patient agrees and gives consent to proceed.  Please refer to procedure notes for findings,  recommendations and patient disposition/instructions.     Patric Buckhalter K. Corky Diener, M.D. Gastroenterology 03/06/2024  9:59 AM

## 2024-03-06 NOTE — Anesthesia Postprocedure Evaluation (Signed)
 Anesthesia Post Note  Patient: Damon English  Procedure(s) Performed: COLONOSCOPY EGD (ESOPHAGOGASTRODUODENOSCOPY) POLYPECTOMY, INTESTINE  Patient location during evaluation: PACU Anesthesia Type: General Level of consciousness: awake and alert, oriented and patient cooperative Pain management: pain level controlled Vital Signs Assessment: post-procedure vital signs reviewed and stable Respiratory status: spontaneous breathing, nonlabored ventilation and respiratory function stable Cardiovascular status: blood pressure returned to baseline and stable Postop Assessment: adequate PO intake Anesthetic complications: no   No notable events documented.   Last Vitals:  Vitals:   03/06/24 1128 03/06/24 1138  BP: (!) 89/51   Pulse:  69  Resp:    Temp:    SpO2:  98%    Last Pain:  Vitals:   03/06/24 1138  TempSrc:   PainSc: 0-No pain                 Dorothey Gate

## 2024-03-06 NOTE — Interval H&P Note (Signed)
 History and Physical Interval Note:  03/06/2024 10:03 AM  Damon English  has presented today for surgery, with the diagnosis of GERD W/O ESOPHAGITIS ADEN POLYPS.  The various methods of treatment have been discussed with the patient and family. After consideration of risks, benefits and other options for treatment, the patient has consented to  Procedure(s): COLONOSCOPY (N/A) EGD (ESOPHAGOGASTRODUODENOSCOPY) (N/A) as a surgical intervention.  The patient's history has been reviewed, patient examined, no change in status, stable for surgery.  I have reviewed the patient's chart and labs.  Questions were answered to the patient's satisfaction.     [name], [name]

## 2024-03-06 NOTE — Anesthesia Procedure Notes (Signed)
 Procedure Name: MAC Date/Time: 03/06/2024 10:51 AM  Performed by: Donnamae Gaba, CRNAPre-anesthesia Checklist: Patient identified, Emergency Drugs available, Suction available, Patient being monitored and Timeout performed Patient Re-evaluated:Patient Re-evaluated prior to induction Oxygen Delivery Method: Simple face mask Induction Type: IV induction Placement Confirmation: positive ETCO2 and CO2 detector

## 2024-03-07 ENCOUNTER — Encounter: Payer: Self-pay | Admitting: Internal Medicine

## 2024-03-07 LAB — SURGICAL PATHOLOGY

## 2024-03-08 ENCOUNTER — Ambulatory Visit: Admitting: Nurse Practitioner

## 2024-03-21 DIAGNOSIS — I739 Peripheral vascular disease, unspecified: Secondary | ICD-10-CM | POA: Diagnosis not present

## 2024-03-21 DIAGNOSIS — I442 Atrioventricular block, complete: Secondary | ICD-10-CM | POA: Diagnosis not present

## 2024-03-21 DIAGNOSIS — I25118 Atherosclerotic heart disease of native coronary artery with other forms of angina pectoris: Secondary | ICD-10-CM | POA: Diagnosis not present

## 2024-03-22 DIAGNOSIS — G4733 Obstructive sleep apnea (adult) (pediatric): Secondary | ICD-10-CM | POA: Diagnosis not present

## 2024-04-02 DIAGNOSIS — I509 Heart failure, unspecified: Secondary | ICD-10-CM | POA: Diagnosis not present

## 2024-04-09 DIAGNOSIS — I5022 Chronic systolic (congestive) heart failure: Secondary | ICD-10-CM | POA: Diagnosis not present

## 2024-04-09 DIAGNOSIS — I130 Hypertensive heart and chronic kidney disease with heart failure and stage 1 through stage 4 chronic kidney disease, or unspecified chronic kidney disease: Secondary | ICD-10-CM | POA: Diagnosis not present

## 2024-04-09 DIAGNOSIS — Z8674 Personal history of sudden cardiac arrest: Secondary | ICD-10-CM | POA: Diagnosis not present

## 2024-04-09 DIAGNOSIS — Z950 Presence of cardiac pacemaker: Secondary | ICD-10-CM | POA: Diagnosis not present

## 2024-04-09 DIAGNOSIS — N183 Chronic kidney disease, stage 3 unspecified: Secondary | ICD-10-CM | POA: Diagnosis not present

## 2024-04-09 DIAGNOSIS — Z86711 Personal history of pulmonary embolism: Secondary | ICD-10-CM | POA: Diagnosis not present

## 2024-04-09 DIAGNOSIS — I25718 Atherosclerosis of autologous vein coronary artery bypass graft(s) with other forms of angina pectoris: Secondary | ICD-10-CM | POA: Diagnosis not present

## 2024-04-09 DIAGNOSIS — Z951 Presence of aortocoronary bypass graft: Secondary | ICD-10-CM | POA: Diagnosis not present

## 2024-04-09 DIAGNOSIS — Z8673 Personal history of transient ischemic attack (TIA), and cerebral infarction without residual deficits: Secondary | ICD-10-CM | POA: Diagnosis not present

## 2024-04-09 DIAGNOSIS — E876 Hypokalemia: Secondary | ICD-10-CM | POA: Diagnosis not present

## 2024-04-09 DIAGNOSIS — I509 Heart failure, unspecified: Secondary | ICD-10-CM | POA: Diagnosis not present

## 2024-04-09 DIAGNOSIS — E1122 Type 2 diabetes mellitus with diabetic chronic kidney disease: Secondary | ICD-10-CM | POA: Diagnosis not present

## 2024-04-09 DIAGNOSIS — Y832 Surgical operation with anastomosis, bypass or graft as the cause of abnormal reaction of the patient, or of later complication, without mention of misadventure at the time of the procedure: Secondary | ICD-10-CM | POA: Diagnosis not present

## 2024-04-09 DIAGNOSIS — T82855A Stenosis of coronary artery stent, initial encounter: Secondary | ICD-10-CM | POA: Diagnosis not present

## 2024-04-09 DIAGNOSIS — Z79899 Other long term (current) drug therapy: Secondary | ICD-10-CM | POA: Diagnosis not present

## 2024-04-09 DIAGNOSIS — Z86718 Personal history of other venous thrombosis and embolism: Secondary | ICD-10-CM | POA: Diagnosis not present

## 2024-04-09 DIAGNOSIS — R9439 Abnormal result of other cardiovascular function study: Secondary | ICD-10-CM | POA: Diagnosis not present

## 2024-04-09 DIAGNOSIS — I25118 Atherosclerotic heart disease of native coronary artery with other forms of angina pectoris: Secondary | ICD-10-CM | POA: Diagnosis not present

## 2024-04-09 DIAGNOSIS — I739 Peripheral vascular disease, unspecified: Secondary | ICD-10-CM | POA: Diagnosis not present

## 2024-04-15 ENCOUNTER — Ambulatory Visit: Admitting: Nurse Practitioner

## 2024-04-15 ENCOUNTER — Encounter: Payer: Self-pay | Admitting: Nurse Practitioner

## 2024-04-15 VITALS — BP 130/82 | HR 83 | Temp 97.1°F | Ht 67.0 in | Wt 209.8 lb

## 2024-04-15 DIAGNOSIS — I25118 Atherosclerotic heart disease of native coronary artery with other forms of angina pectoris: Secondary | ICD-10-CM | POA: Diagnosis not present

## 2024-04-15 DIAGNOSIS — Z6832 Body mass index (BMI) 32.0-32.9, adult: Secondary | ICD-10-CM | POA: Diagnosis not present

## 2024-04-15 DIAGNOSIS — Z87891 Personal history of nicotine dependence: Secondary | ICD-10-CM

## 2024-04-15 DIAGNOSIS — E66811 Obesity, class 1: Secondary | ICD-10-CM

## 2024-04-15 DIAGNOSIS — G4733 Obstructive sleep apnea (adult) (pediatric): Secondary | ICD-10-CM

## 2024-04-15 NOTE — Progress Notes (Unsigned)
 @Patient  ID: Damon English, male    DOB: Nov 17, 1950, 73 y.o.   MRN: [id number]  Chief Complaint  Patient presents with   Follow-up    CPAP compliance. Wakes up with dry mouth. Forgets to put is on some nights. Gets ulcers on his gums.     Referring provider: Montey Lot, PA-C  HPI: 73 year old male, former smoker followed for sleep apnea.  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  TEST/EVENTS:  01/03/2023 HST: REI 17.7/h, 100% obstructive; SpO2 low 63% 08/26/2023 CPAP titration outside facility >> optimal setting 11 cm of water, residual AHI 3/h.  Was not evaluated in REM sleep.  03/06/2023: Ov with Morell Mears NP for sleep consult, referred by Lot Montey, PA.  He had a home sleep study in March 2024 with [facility] which revealed moderate obstructive sleep apnea.  He had been having difficulties with his sleep for many years prior to this.  He wanted to switch care over to [facility] so he asked to have sleep consult here.  He was never started on CPAP therapy after his study and has never been on CPAP in the past.  He continues to have a lot of trouble with his sleep.  Feels tired in the morning; wants to sleep all day.  Significant daytime fatigue symptoms.  Usually has dry mouth when he wakes up.  Has loud snoring.  His wife is noticed that he gasps awake and sounds like he is choking at times.  Has had some sleepiness on longer distance drives but no issues around town and has never fallen asleep while driving. Some sleep talking. No sleep walking/paralysis.  He goes to bed between 9 to 11 PM.  Falls asleep quickly.  Wakes 2 or more times a night.  Usually gets up around 9:10 AM. No sleep aids.  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  Weight is up 10 to 15 pounds over the last 2 years. He has a significant cardiac history as well as history of stroke and diabetes.  He is a former smoker; quit 10 years ago.  Usually drinks 6-8 beers a day.  He has a past history of marijuana and cocaine use; quit 25 years ago.  No excessive caffeine intake.  Lives with his wife.  He is self-employed.  Has an adult child.  Family history of heart disease and cancer. Ep[place] 6  06/02/2023: OV with Leandre Wien NP for follow-up after being started on CPAP therapy.  He was diagnosed with COVID mid July.  He did not wear his CPAP for about 3 weeks during this period.  Trying to get back on therapy.  Having some difficulties with mask leakage and fit.  He did switch to a DreamWear fullface mask but does not seem to be making a big difference.  Still not feeling significantly better during the day.  Feels like he is usually tired and wants to sleep during the day.  Denies any recent drowsy driving or morning headaches.  No sleep parasomnia/paralysis. 05/02/2023-05/31/2023 CPAP 5-15 cmH2O 12/30 days; 30% >4 hr; average usage 5 hours 54 minutes Pressure 95th 9.2 Leaks 95th 58.3 AHI 10.3  08/18/2023: Ov with Carry Ortez NP for follow-up via video visit.  We made adjustments  to his CPAP after his last visit due to significant leaks.  He was also not feeling much benefit during the day.  We brought him down to 5-9 cmH2O from 5 to 15 cm water.  He also switched over to an [place] fullface mask, which he is liking.  He actually is feeling better during the day.  Feels like his energy levels have improved.  He has been wearing the CPAP every night.  Does occasionally wake up and has noticed that he is taken it off but this does not happen very often.  Denies any issues with drowsy driving or morning headaches.   He is having some cardiac issues with angina and dyspnea on exertion, which he is working with his cardiologist on.  No active  chest pain. Understands that if symptoms worsen he should go to the emergency room. 07/18/2023-08/16/2023: CPAP 5-9 cmH2O 30/30 days; 87% >4 hr; average use 7 hr 39 min Pressure 95th 8.7 Leaks 95th 32.7 AHI 12.3/h, CAI 0.2   11/17/2023: OV with Graylin Sperling NP for follow-up.  Had his CPAP titration study at [facility] 11/9.  Unfortunately we did not receive these records but I was able to review them in care everywhere.  He contacted us  in December to let us  know that he had completed the study.  Settings were adjusted to 11 to 15 cm water.  This has been working relatively well for him; however the fullface mask is causing a recurrent ulcer on his upper gum.  His dentist was the one who notices first.  It is tender.  Has difficulties getting it to go away.  Ulcer is right where his mask presses above upper lip.  He has not been able to tolerate nasal masks in the past because he keeps mouth breathing.  Chinstrap did not help.  Would like to move to a regular fullface mask. Otherwise, he does feel like the new settings are helping.  Feels like he is a little bit better rested and sleeping a little more restfully.  He is currently on Ozempic and noticed that when the titrated his dose up a few weeks ago, it has made him a little more fatigued but otherwise doing okay.  No issues with drowsy driving or morning headaches.  Still having some leaks on current settings. 10/16/2024-11/15/2023: CPAP 11 to 15 cmH2O 20/30 days; 53% >4 hr; average use 4 hours 57 minutes Pressure 95th 11.2 Leaks 95th 53.6 AHI 5.6  12/29/2023: Ov with Amir Glaus NP for follow-up.  At his last visit we changed him to an AirFit F20 mask due to pressure ulcer that he has started develop on his's and difficulties with seal/leaks.  He feels like this is working much better for him.  Seems to be feeling better.  He did just started over the last couple of weeks because they were having difficulties figuring out exactly how to work the mask and had trouble  getting in touch with [employer].  He has been wearing it consistently for the past few nights.  Seal has been good.  Feels like he has been resting well.  Does feel like his energy levels are better during the day.  Denies any issues with drowsy driving or morning headaches.   He does have a sore on the bridge of his nose that he has developed since switching the mask.  No exudate.  Skin is intact right now.  No tenderness. Breathing has been better since his cardiologist adjusted his fluid pill.  No issues  with cough or wheezing.  No leg swelling or weight gain. 11/28/2023-12/27/2023 CPAP 11-15 cmH2O 27/30 days; 67% >4 hr; average use 5 hours 58 minutes Pressure 95th 12.8 Leaks 95th 33.1 AHI 1.3 Significant improvement in leaks, less than 10 L/min with recent mask change  04/15/2024: Today - follow up Patient presents today for CPAP follow-up.  Trying to be consistent with use.  Tends to take it off at night and some nights falls asleep without it.  He does feel more rested when he uses it than 1 without it.  Currently using a hybrid fullface mask.  Has some issues with ulcers on his upper gum, that have been related to the pressure from the masks in the past.  Was at some point switched to an AirFit F20 fullface mask but then developed a pressure ulcer to the bridge of his nose.  He was instructed to switch back and forth between the 2, which he has not been doing.  Has not ordered new supplies from [employer].  Wanted to know if there was an easier way of doing so than calling them.  No issues with drowsy driving. He still having some trouble with fatigue and breathing.  He had a cardiac catheterization and has over a 90% blockage in one of his coronary arteries.  They are planning to do a stent later this week.  No chest pain.  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  Is on NOAC for now.  No ASA with it. Not allergic per patient.  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  Is on NOAC for now.  No ASA with it. Not allergic per patient.   Per pt not an allergy    Immunization History  Administered Date(s) Administered   Influenza Split 08/21/2012   Influenza, Quadrivalent, Recombinant, Inj, Pf 11/08/2017, 08/08/2018, 09/09/2019   Influenza, Seasonal, Injecte, Preservative Fre 07/17/2018   Influenza,inj,Quad PF,6+ Mos 11/01/2016   Influenza,inj,quad, With Preservative 11/12/2015   Influenza-Unspecified 07/23/2013, 07/27/2021, 07/17/2022   PFIZER Comirnaty(Gray Top)Covid-19 Tri-Sucrose Vaccine 10/30/2020   PFIZER(Purple Top)SARS-COV-2 Vaccination 12/16/2019, 01/13/2020   Pneumococcal Polysaccharide-23 01/29/2019   Td 12/20/2023   Tdap 03/20/2006    Past Medical History:  Diagnosis Date   Alcohol use    cutting back but still heavy and daily, multiple shots of bourbon each night   Diabetes mellitus without complication (HCC)    History of tobacco use    reportedly quit in 2013   Hypertension    Pulmonary embolism (HCC) 2012   unprovoked   PVD (peripheral vascular disease) (HCC)     Tobacco History: Social History   Tobacco Use  Smoking Status Former   Current packs/day: 0.00   Average packs/day: 2.0 packs/day for 40.0 years (80.0 ttl pk-yrs)   Types: Cigarettes   Start date: 11/27/1970   Quit date: 11/27/2010   Years since quitting: 13.3  Smokeless Tobacco Never  Tobacco Comments   Quit in 2012   Counseling given: Not Answered Tobacco comments: Quit in 2012   Outpatient Medications  Prior to Visit  Medication Sig Dispense Refill   acetaminophen  (TYLENOL ) 325 MG tablet Take 2 tablets (650 mg total) by mouth every 6 (six) hours as needed for fever. 20 tablet 0   apixaban  (ELIQUIS ) 5 MG TABS tablet Take by mouth.     atorvastatin  (LIPITOR) 80 MG tablet Take 80 mg by mouth daily.     clonazePAM  (KLONOPIN ) 0.25 MG disintegrating tablet Take 1-2 tablets (0.25-0.5 mg total) by mouth 3 (three) times daily as needed (anxiety/agitation). 20 tablet 0   clonazePAM  (KLONOPIN ) 0.5 MG tablet Take 0.5 mg by mouth 2 (two) times daily as needed.     clopidogrel  (PLAVIX ) 75 MG tablet Take 1 tablet (75 mg total) by mouth daily. 30 tablet 0   diclofenac Sodium (VOLTAREN) 1 % GEL Apply 4 g topically 4 (four) times daily.     DULoxetine  (CYMBALTA ) 30 MG capsule Take 30 mg by mouth daily.     famotidine  (PEPCID ) 40 MG tablet Take 1 tablet by mouth every morning.     furosemide  (LASIX ) 20 MG tablet Take 40 mg by mouth daily as needed (Edema (Or weight gain of >2lbs overnight or 5lbs in one week)).     gabapentin  (NEURONTIN ) 100 MG capsule Take 1-2 capsules (100-200 mg total) by mouth 2 (two) times daily. Follow written titration schedule 120 capsule 5   glipiZIDE  (GLUCOTROL ) 10 MG tablet Take 5 mg by mouth as needed.     JARDIANCE 10 MG TABS tablet Take 10 mg by mouth daily.     latanoprost  (XALATAN ) 0.005 % ophthalmic solution Place 1 drop into both eyes at bedtime. 2.5 mL 12   losartan  (COZAAR ) 25 MG tablet Take 25 mg by mouth daily.     metoprolol  succinate (TOPROL -XL) 25 MG 24 hr tablet Take 25 mg by mouth at bedtime.     nitroGLYCERIN  (NITROSTAT ) 0.4 MG SL tablet Place 1 tablet (0.4 mg total) under the tongue every 5 (five) minutes as needed for chest pain. 20 tablet 12   OZEMPIC, 1 MG/DOSE, 4 MG/3ML SOPN Inject 1 mg into the skin once a week.     pantoprazole  (PROTONIX ) 40 MG tablet Take 40 mg by mouth daily.     polyethylene glycol (MIRALAX  / GLYCOLAX ) 17 g packet Take 17 g by mouth daily as  needed for mild constipation or moderate constipation. 14 each 0   potassium chloride  (MICRO-K ) 10 MEQ CR capsule Take 20 mEq by mouth daily.     ranolazine  (RANEXA ) 500 MG 12 hr tablet Take 500 mg by mouth 2 (two) times daily.     SENNOSIDES PO Take by mouth.     spironolactone  (ALDACTONE ) 25 MG tablet Take 25 mg by mouth daily.     thiamine  (VITAMIN B1) 100 MG tablet Take 100 mg by mouth daily.     torsemide (DEMADEX) 20 MG tablet Take 40 mg by mouth daily.     triamcinolone  (KENALOG ) 0.1 % paste Use as directed 1 Application in the mouth or throat 2 (two) times daily. Until healed 20 g 0   triamcinolone  cream (KENALOG ) 0.1 % APPLY TWICE DAILY TO RASH ON LEGS UP TO 2 WEEKS/MONTH AS NEEDED.     No facility-administered medications prior to visit.     Review of Systems:   Constitutional: No night sweats, fevers, chills, or lassitude. +daytime fatigue (improving), intentional weight loss HEENT: No headaches, difficulty swallowing, tooth/dental problems, or sore throat. No sneezing, itching, ear ache, nasal congestion, or post nasal drip.  CV:  No chest pain, orthopnea, PND, swelling in lower extremities, anasarca, dizziness, palpitations, syncope Resp: +snoring, witnessed apneas (without CPAP); shortness of breath with exertion (improved; stable). No excess mucus or change in color of mucus. No productive or non-productive. No hemoptysis. No wheezing.  No chest wall deformity GI:  +occasional heartburn, indigestion. No abdominal pain, nausea, vomiting, diarrhea, change in bowel habits, loss of appetite, bloody stools.  GU: No dysuria, change in color of urine, urgency or frequency, nocturia  Skin: No rash, lesions. +easily bruised (on chronic AC) MSK:  +chronic joint pains. No joint swelling.   Neuro: No dizziness or lightheadedness.  Psych: +depression, anxiety (stable; baseline). No SI/HI. Mood stable. +sleep disturbance (resolved with CPAP)    Physical Exam:  BP 130/82 (BP Location:  Right Arm, Cuff Size: Large)   Pulse 83   Temp (!) 97.1 F (36.2 C)   Ht 5' 7 (1.702 m)   Wt 209 lb 12.8 oz (95.2 kg)   SpO2 99%   BMI 32.86 kg/m   GEN: Pleasant, interactive, well-kempt; obese; in no acute distress. HEENT:  Normocephalic and atraumatic. PERRLA. Sclera white. Nasal turbinates pink, moist and patent bilaterally. No rhinorrhea present. Oropharynx pink and moist, without exudate or edema. No postnasal drip, ulcerations, lesions to oral mucosa. Mallampati III.   NECK:  Supple w/ fair ROM. Thyroid  symmetrical with no goiter or nodules palpated. No lymphadenopathy.   CV: RRR, no m/r/g, no peripheral edema. Pulses intact, +2 bilaterally. No cyanosis, pallor or clubbing. PULMONARY:  Unlabored, regular breathing. Clear bilaterally A&P w/o wheezes/rales/rhonchi. No accessory muscle use.  GI: BS present and normoactive. Soft, non-tender to palpation. No organomegaly or masses detected. MSK: No erythema, warmth or tenderness. Cap refil <2 sec all extrem. No deformities or joint swelling noted.  Neuro: A/Ox3. No focal deficits noted.   Skin: Warm, no lesions or rashes Psych: Normal affect and behavior. Judgement and thought content appropriate.     Lab Results:  CBC    Component Value Date/Time   WBC 9.9 07/11/2021 1317   RBC 4.29 07/11/2021 1317   HGB 13.6 07/11/2021 1317   HGB 16.1 11/08/2013 1530   HCT 41.1 07/11/2021 1317   HCT 46.4 11/08/2013 1530   PLT 158 07/11/2021 1317   PLT 173 11/08/2013 1530   MCV 95.8 07/11/2021 1317   MCV 104 (H) 11/08/2013 1530   MCH 31.7 07/11/2021 1317   MCHC 33.1 07/11/2021 1317   RDW 16.1 (H) 07/11/2021 1317   RDW 12.3 11/08/2013 1530   LYMPHSABS 1.4 07/11/2021 1317   MONOABS 0.9 07/11/2021 1317   EOSABS 0.1 07/11/2021 1317   BASOSABS 0.1 07/11/2021 1317    BMET    Component Value Date/Time   NA 135 07/11/2021 1317   NA 136 11/08/2013 1530   K 4.2 07/11/2021 1317   K 3.4 (L) 11/08/2013 1530   CL 100 07/11/2021 1317   CL  102 11/08/2013 1530   CO2 21 (L) 07/11/2021 1317   CO2 27 11/08/2013 1530   GLUCOSE 139 (H) 07/11/2021 1317   GLUCOSE 150 (H) 11/08/2013 1530   BUN 30 (H) 11/04/2021 1026   BUN 16 11/08/2013 1530   CREATININE 1.53 (H) 11/04/2021 1026   CREATININE 1.07 11/08/2013 1530   CALCIUM  9.5 07/11/2021 1317   CALCIUM  9.3 11/08/2013 1530   GFRNONAA 49 (L) 11/04/2021 1026   GFRNONAA >60 11/08/2013 1530   GFRAA >60 01/30/2019 0652   GFRAA >60 11/08/2013 1530    BNP    Component Value  Date/Time   BNP 218.5 (H) 07/11/2021 1317     Imaging:  No results found.   Administration History     None           No data to display          No results found for: NITRICOXIDE      Assessment & Plan:   Moderate obstructive sleep apnea Moderate OSA on CPAP. Suboptimal compliance but excellent control. He does receive benefit from use. Primary issue is related to mask. Advised him to alternate between AirFit F40 and AirFit F20 as he tends to develop ulcers from pressure points on the masks - order from DME. Reviewed risks of untreated OSA. Encouraged to increase nightly use. Safe driving practices reviewed.  Patient Instructions  Continue to use CPAP every night, minimum of 4-6 hours a night.  Change equipment as directed. Wash your tubing with warm soap and water daily, hang to dry. Wash humidifier portion weekly. Use bottled, distilled water and change daily Be aware of reduced alertness and do not drive or operate heavy machinery if experiencing this or drowsiness.  Exercise encouraged, as tolerated. Healthy weight management discussed.  Avoid or decrease alcohol consumption and medications that make you more sleepy, if possible. Notify if persistent daytime sleepiness occurs even with consistent use of PAP therapy.  You can switch back and forth between the AirFit F40 and AirFit F20 masks - call your medical supply company to order or use their online ordering  portal [email or web address]  Use a mask liner with the F20 mask - get on [employer]   Follow up in 6 months with Katie Stehanie Ekstrom,NP. If symptoms do not improve or worsen, please contact office for sooner follow up or seek emergency care.    CAD (coronary artery disease) Significant cardiac disease contributing to chronic DOE. Awaiting repeat cardiac cath with stenting. Follow up with cardiology as scheduled. Aware of red flag symptoms.   Obesity (BMI 30.0-34.9) BMI 32. Healthy weight loss encouraged      I spent 35 minutes of dedicated to the care of this patient on the date of this encounter to include pre-visit review of records, face-to-face time with the patient discussing conditions above, post visit ordering of testing, clinical documentation with the electronic health record, making appropriate referrals as documented, and communicating necessary findings to members of the patients care team.  Comer LULLA Rouleau, NP 04/17/2024  Pt aware and understands NP's role.

## 2024-04-15 NOTE — Patient Instructions (Addendum)
 Continue to use CPAP every night, minimum of 4-6 hours a night.  Change equipment as directed. Wash your tubing with warm soap and water daily, hang to dry. Wash humidifier portion weekly. Use bottled, distilled water and change daily Be aware of reduced alertness and do not drive or operate heavy machinery if experiencing this or drowsiness.  Exercise encouraged, as tolerated. Healthy weight management discussed.  Avoid or decrease alcohol consumption and medications that make you more sleepy, if possible. Notify if persistent daytime sleepiness occurs even with consistent use of PAP therapy.  You can switch back and forth between the AirFit F40 and AirFit F20 masks - call your medical supply company to order or use their online ordering portal [email or web address]  Use a mask liner with the F20 mask - get on [employer]   Follow up in 6 months with Damon Kymani Laursen,NP. If symptoms do not improve or worsen, please contact office for sooner follow up or seek emergency care.

## 2024-04-16 DIAGNOSIS — I25118 Atherosclerotic heart disease of native coronary artery with other forms of angina pectoris: Secondary | ICD-10-CM | POA: Diagnosis not present

## 2024-04-16 DIAGNOSIS — Z01812 Encounter for preprocedural laboratory examination: Secondary | ICD-10-CM | POA: Diagnosis not present

## 2024-04-17 ENCOUNTER — Encounter: Payer: Self-pay | Admitting: Nurse Practitioner

## 2024-04-17 DIAGNOSIS — Z950 Presence of cardiac pacemaker: Secondary | ICD-10-CM | POA: Diagnosis not present

## 2024-04-17 DIAGNOSIS — Z7901 Long term (current) use of anticoagulants: Secondary | ICD-10-CM | POA: Diagnosis not present

## 2024-04-17 DIAGNOSIS — I4901 Ventricular fibrillation: Secondary | ICD-10-CM | POA: Diagnosis not present

## 2024-04-17 DIAGNOSIS — I5022 Chronic systolic (congestive) heart failure: Secondary | ICD-10-CM | POA: Diagnosis not present

## 2024-04-17 NOTE — Assessment & Plan Note (Signed)
 BMI 32. Healthy weight loss encouraged.

## 2024-04-17 NOTE — Assessment & Plan Note (Signed)
 Moderate OSA on CPAP. Suboptimal compliance but excellent control. He does receive benefit from use. Primary issue is related to mask. Advised him to alternate between AirFit F40 and AirFit F20 as he tends to develop ulcers from pressure points on the masks - order from DME. Reviewed risks of untreated OSA. Encouraged to increase nightly use. Safe driving practices reviewed.  Patient Instructions  Continue to use CPAP every night, minimum of 4-6 hours a night.  Change equipment as directed. Wash your tubing with warm soap and water daily, hang to dry. Wash humidifier portion weekly. Use bottled, distilled water and change daily Be aware of reduced alertness and do not drive or operate heavy machinery if experiencing this or drowsiness.  Exercise encouraged, as tolerated. Healthy weight management discussed.  Avoid or decrease alcohol consumption and medications that make you more sleepy, if possible. Notify if persistent daytime sleepiness occurs even with consistent use of PAP therapy.  You can switch back and forth between the AirFit F40 and AirFit F20 masks - call your medical supply company to order or use their online ordering portal [email or web address]  Use a mask liner with the F20 mask - get on [employer]   Follow up in 6 months with Katie Tylena Prisk,NP. If symptoms do not improve or worsen, please contact office for sooner follow up or seek emergency care.

## 2024-04-17 NOTE — Assessment & Plan Note (Signed)
 Significant cardiac disease contributing to chronic DOE. Awaiting repeat cardiac cath with stenting. Follow up with cardiology as scheduled. Aware of red flag symptoms.

## 2024-04-21 DIAGNOSIS — G4733 Obstructive sleep apnea (adult) (pediatric): Secondary | ICD-10-CM | POA: Diagnosis not present

## 2024-04-23 DIAGNOSIS — T82855A Stenosis of coronary artery stent, initial encounter: Secondary | ICD-10-CM | POA: Diagnosis not present

## 2024-04-23 DIAGNOSIS — Z86711 Personal history of pulmonary embolism: Secondary | ICD-10-CM | POA: Diagnosis not present

## 2024-04-23 DIAGNOSIS — Z8674 Personal history of sudden cardiac arrest: Secondary | ICD-10-CM | POA: Diagnosis not present

## 2024-04-23 DIAGNOSIS — I25118 Atherosclerotic heart disease of native coronary artery with other forms of angina pectoris: Secondary | ICD-10-CM | POA: Diagnosis not present

## 2024-04-23 DIAGNOSIS — I509 Heart failure, unspecified: Secondary | ICD-10-CM | POA: Diagnosis not present

## 2024-04-23 DIAGNOSIS — Z86718 Personal history of other venous thrombosis and embolism: Secondary | ICD-10-CM | POA: Diagnosis not present

## 2024-04-23 DIAGNOSIS — I5032 Chronic diastolic (congestive) heart failure: Secondary | ICD-10-CM | POA: Diagnosis not present

## 2024-04-23 DIAGNOSIS — Z8673 Personal history of transient ischemic attack (TIA), and cerebral infarction without residual deficits: Secondary | ICD-10-CM | POA: Diagnosis not present

## 2024-04-23 DIAGNOSIS — Z8679 Personal history of other diseases of the circulatory system: Secondary | ICD-10-CM | POA: Diagnosis not present

## 2024-04-23 DIAGNOSIS — Z8774 Personal history of (corrected) congenital malformations of heart and circulatory system: Secondary | ICD-10-CM | POA: Diagnosis not present

## 2024-04-23 DIAGNOSIS — Z95820 Peripheral vascular angioplasty status with implants and grafts: Secondary | ICD-10-CM | POA: Diagnosis not present

## 2024-04-23 DIAGNOSIS — Z950 Presence of cardiac pacemaker: Secondary | ICD-10-CM | POA: Diagnosis not present

## 2024-04-23 DIAGNOSIS — Z951 Presence of aortocoronary bypass graft: Secondary | ICD-10-CM | POA: Diagnosis not present

## 2024-04-23 DIAGNOSIS — I255 Ischemic cardiomyopathy: Secondary | ICD-10-CM | POA: Diagnosis not present

## 2024-05-20 DIAGNOSIS — G4733 Obstructive sleep apnea (adult) (pediatric): Secondary | ICD-10-CM | POA: Diagnosis not present

## 2024-05-22 ENCOUNTER — Ambulatory Visit
Attending: Student in an Organized Health Care Education/Training Program | Admitting: Student in an Organized Health Care Education/Training Program

## 2024-05-22 ENCOUNTER — Encounter: Payer: Self-pay | Admitting: Student in an Organized Health Care Education/Training Program

## 2024-05-22 VITALS — BP 107/85 | HR 88 | Temp 97.5°F | Resp 16 | Ht 67.0 in | Wt 210.0 lb

## 2024-05-22 DIAGNOSIS — G894 Chronic pain syndrome: Secondary | ICD-10-CM | POA: Diagnosis not present

## 2024-05-22 DIAGNOSIS — G4733 Obstructive sleep apnea (adult) (pediatric): Secondary | ICD-10-CM | POA: Diagnosis not present

## 2024-05-22 DIAGNOSIS — E1140 Type 2 diabetes mellitus with diabetic neuropathy, unspecified: Secondary | ICD-10-CM | POA: Diagnosis not present

## 2024-05-22 MED ORDER — CAPSAICIN-CLEANSING GEL 8 % EX KIT
2.0000 | PACK | Freq: Once | CUTANEOUS | Status: AC
  Administered 2024-05-22: 2 via TOPICAL
  Filled 2024-05-22: qty 2

## 2024-05-22 NOTE — Progress Notes (Signed)
 PROVIDER NOTE: Interpretation of information contained herein should be left to medically-trained personnel. Specific patient instructions are provided elsewhere under Patient Instructions section of medical record. This document was created in part using STT-dictation technology, any transcriptional errors that may result from this process are unintentional.  Patient: Damon English Type: Established DOB: 08/28/1951 MRN: [id number] PCP: Montey Lot, PA-C  Service: Procedure DOS: 05/22/2024 Setting: Ambulatory Location: Ambulatory outpatient facility Delivery: Face-to-face Provider: Wallie Sherry, MD Specialty: Interventional Pain Management Specialty designation: 09 Location: Outpatient facility Ref. Prov.: Montey Lot, PA-C    Primary Reason for Visit: Interventional Pain Management Treatment. CC: Foot Pain (right)    Procedure:          Qutenza  treatment for right foot painful diabetic neuropathy #5   1. Chronic painful diabetic neuropathy (HCC)   2. Chronic pain syndrome    NAS-11 Pain score:   Pre-procedure: 7 /10   Post-procedure: 7 /10     Pre-op H&P Assessment:  Damon English is a 73 y.o. (year old), male patient, seen today for interventional treatment. He  has a past surgical history that includes Vascular surgery; Cholecystectomy; Back surgery; Femoral artery - popliteal artery bypass graft (Right, 06/19/2018); ERCP w/ sphicterotomy (2015); Thromboendarterectomy (Right, 06/19/2018); Tonsillectomy; CORONARY ANGIOGRAPHY (N/A, 01/30/2019); Lower Extremity Angiography (Left, 01/14/2021); LEFT HEART CATH AND CORS/GRAFTS ANGIOGRAPHY (N/A, 06/25/2021); TEMPORARY PACEMAKER (N/A, 07/11/2021); Lower Extremity Angiography (Left, 11/04/2021); Colonoscopy (N/A, 03/06/2024); Esophagogastroduodenoscopy (N/A, 03/06/2024); and Polypectomy (03/06/2024). Damon English has a current medication list which includes the following prescription(s): acetaminophen , apixaban , atorvastatin , clonazepam ,  clonazepam , clopidogrel , diclofenac sodium, duloxetine , famotidine , furosemide , gabapentin , glipizide , jardiance, latanoprost , losartan , metoprolol  succinate, nitroglycerin , ozempic (1 mg/dose), pantoprazole , polyethylene glycol, potassium chloride , ranolazine , sennosides, spironolactone , thiamine , torsemide, triamcinolone , and triamcinolone  cream. His primarily concern today is the Foot Pain (right)  Initial Vital Signs:  Pulse/HCG Rate: 88  Temp: (!) 97.5 F (36.4 C) Resp: 16 BP: 107/85 SpO2: 96 %  BMI: Estimated body mass index is 32.89 kg/m as calculated from the following:   Height as of this encounter: 5' 7 (1.702 m).   Weight as of this encounter: 210 lb (95.3 kg).  Risk Assessment: Allergies: Reviewed. He is allergic to amoxicillin, hydromorphone  hcl, and aspirin .  Allergy Precautions: None required Coagulopathies: Reviewed. None identified.  Blood-thinner therapy: None at this time Active Infection(s): Reviewed. None identified. Damon English is afebrile  Site Confirmation: Damon English was asked to confirm the procedure and laterality before marking the site Procedure checklist: Completed Consent: Before the procedure and under the influence of no sedative(s), amnesic(s), or anxiolytics, the patient was informed of the treatment options, risks and possible complications. To fulfill our ethical and legal obligations, as recommended by the American Medical Association's Code of Ethics, I have informed the patient of my clinical impression; the nature and purpose of the treatment or procedure; the risks, benefits, and possible complications of the intervention; the alternatives, including doing nothing; the risk(s) and benefit(s) of the alternative treatment(s) or procedure(s); and the risk(s) and benefit(s) of doing nothing. The patient was provided information about the general risks and possible complications associated with the procedure. These may include, but are not limited  to: failure to achieve desired goals, infection, bleeding, organ or nerve damage, allergic reactions, paralysis, and death. In addition, the patient was informed of those risks and complications associated to the procedure, such as failure to decrease pain; infection; bleeding; organ or nerve damage with subsequent damage to sensory, motor, and/or autonomic systems, resulting in permanent pain, numbness, and/or  weakness of one or several areas of the body; allergic reactions; (i.e.: anaphylactic reaction); and/or death. Furthermore, the patient was informed of those risks and complications associated with the medications. These include, but are not limited to: allergic reactions (i.e.: anaphylactic or anaphylactoid reaction(s)); adrenal axis suppression; blood sugar elevation that in diabetics may result in ketoacidosis or comma; water retention that in patients with history of congestive heart failure may result in shortness of breath, pulmonary edema, and decompensation with resultant heart failure; weight gain; swelling or edema; medication-induced neural toxicity; particulate matter embolism and blood vessel occlusion with resultant organ, and/or nervous system infarction; and/or aseptic necrosis of one or more joints. Finally, the patient was informed that Medicine is not an exact science; therefore, there is also the possibility of unforeseen or unpredictable risks and/or possible complications that may result in a catastrophic outcome. The patient indicated having understood very clearly. We have given the patient no guarantees and we have made no promises. Enough time was given to the patient to ask questions, all of which were answered to the patient's satisfaction. Damon English has indicated that he wanted to continue with the procedure. Attestation: I, the ordering provider, attest that I have discussed with the patient the benefits, risks, side-effects, alternatives, likelihood of achieving goals,  and potential problems during recovery for the procedure that I have provided informed consent. Date  Time: 05/22/2024 11:00 AM  Pre-Procedure Preparation:  Monitoring: As per clinic protocol. Respiration, ETCO2, SpO2, BP, heart rate and rhythm monitor placed and checked for adequate function Safety Precautions: Patient was assessed for positional comfort and pressure points before starting the procedure. Time-out: I initiated and conducted the Time-out before starting the procedure, as per protocol. The patient was asked to participate by confirming the accuracy of the Time Out information. Verification of the correct person, site, and procedure were performed and confirmed by me, the nursing staff, and the patient. Time-out conducted as per Joint Commission's Universal Protocol (UP.01.01.01). Time: 1121  Description of Procedure:                              Vitals:   05/22/24 1116 05/22/24 1118  BP: 107/85 107/85  Pulse: 88 88  Resp: 16 16  Temp: (!) 97.5 F (36.4 C) (!) 97.5 F (36.4 C)  SpO2: 96% 96%  Weight: 210 lb (95.3 kg)   Height: 5' 7 (1.702 m)       Start Time: 1121 hrs. End Time:   hrs.  Qutenza  patch applied to the plantar and dorsal surface of the right foot.     Post-operative Assessment:  Post-procedure Vital Signs:  Pulse/HCG Rate: 88  Temp:  (!) 97.5 F (36.4 C) Resp: 16 BP:  107/85 SpO2: 96 %  EBL: None  Complications: No immediate post-treatment complications observed by team, or reported by patient.  Note: The patient tolerated the entire procedure well. A repeat set of vitals were taken after the procedure and the patient was kept under observation following institutional policy, for this type of procedure. Post-procedural neurological assessment was performed, showing return to baseline, prior to discharge. The patient was provided with post-procedure discharge instructions, including a section on how to identify potential problems.  Should any problems arise concerning this procedure, the patient was given instructions to immediately contact us , at any time, without hesitation. In any case, we plan to contact the patient by telephone for a follow-up status report regarding this interventional  procedure.  Comments:  No additional relevant information.  Plan of Care     Medications ordered for procedure: Meds ordered this encounter  Medications   capsaicin  topical system 8 % patch 2 patch    Disregard order if transmitted to pharmacy. Qutenza  Kit to be used from [facility].   Medications administered: We administered capsaicin  topical system.  See the medical record for exact dosing, route, and time of administration.  Follow-up plan:   Return in 8 weeks (on 07/17/2024) for PPE, F2F.       Qutenza  03/09/22, 06/15/22, 10/26/22, 12/20/22   Recent Visits No visits were found meeting these conditions. Showing recent visits within past 90 days and meeting all other requirements Today's Visits Date Type Provider Dept  05/22/24 Procedure visit Marcelino Nurse, MD [facility]  Showing today's visits and meeting all other requirements Future Appointments Date Type Provider Dept  07/16/24 Appointment Marcelino Nurse, MD [facility]  Showing future appointments within next 90 days and meeting all other requirements  Disposition: Discharge home  Discharge (Date  Time): 05/22/2024; 1213 hrs.   Primary Care Physician: Montey Lot, PA-C Location: [facility] Note by: Nurse Marcelino, MD Date: 05/22/2024; Time: 1:43 PM  Disclaimer:  Medicine is not an exact science. The only guarantee in medicine is that nothing is guaranteed. It is important to note that the decision to proceed with this intervention was based on the information collected from the patient. The Data and conclusions were drawn from the patient's questionnaire, the interview, and the physical  examination. Because the information was provided in large part by the patient, it cannot be guaranteed that it has not been purposely or unconsciously manipulated. Every effort has been made to obtain as much relevant data as possible for this evaluation. It is important to note that the conclusions that lead to this procedure are derived in large part from the available data. Always take into account that the treatment will also be dependent on availability of resources and existing treatment guidelines, considered by other Pain Management Practitioners as being common knowledge and practice, at the time of the intervention. For Medico-Legal purposes, it is also important to point out that variation in procedural techniques and pharmacological choices are the acceptable norm. The indications, contraindications, technique, and results of the above procedure should only be interpreted and judged by a Board-Certified Interventional Pain Specialist with extensive familiarity and expertise in the same exact procedure and technique.

## 2024-05-22 NOTE — Progress Notes (Signed)
 Safety precautions to be maintained throughout the outpatient stay will include: orient to surroundings, keep bed in low position, maintain call bell within reach at all times, provide assistance with transfer out of bed and ambulation.

## 2024-05-23 ENCOUNTER — Telehealth: Payer: Self-pay

## 2024-05-23 NOTE — Telephone Encounter (Signed)
 Post procedure follow up.  LM

## 2024-06-03 DIAGNOSIS — I5022 Chronic systolic (congestive) heart failure: Secondary | ICD-10-CM | POA: Diagnosis not present

## 2024-06-07 ENCOUNTER — Encounter (INDEPENDENT_AMBULATORY_CARE_PROVIDER_SITE_OTHER): Payer: BC Managed Care – PPO

## 2024-06-07 ENCOUNTER — Ambulatory Visit (INDEPENDENT_AMBULATORY_CARE_PROVIDER_SITE_OTHER): Payer: Medicare Other | Admitting: Vascular Surgery

## 2024-06-07 ENCOUNTER — Encounter (INDEPENDENT_AMBULATORY_CARE_PROVIDER_SITE_OTHER): Payer: Medicare Other

## 2024-06-10 DIAGNOSIS — Z86718 Personal history of other venous thrombosis and embolism: Secondary | ICD-10-CM | POA: Diagnosis not present

## 2024-06-10 DIAGNOSIS — S41111A Laceration without foreign body of right upper arm, initial encounter: Secondary | ICD-10-CM | POA: Diagnosis not present

## 2024-06-14 DIAGNOSIS — I5022 Chronic systolic (congestive) heart failure: Secondary | ICD-10-CM | POA: Diagnosis not present

## 2024-06-14 DIAGNOSIS — Z951 Presence of aortocoronary bypass graft: Secondary | ICD-10-CM | POA: Diagnosis not present

## 2024-06-14 DIAGNOSIS — I25119 Atherosclerotic heart disease of native coronary artery with unspecified angina pectoris: Secondary | ICD-10-CM | POA: Diagnosis not present

## 2024-06-14 DIAGNOSIS — I442 Atrioventricular block, complete: Secondary | ICD-10-CM | POA: Diagnosis not present

## 2024-06-14 DIAGNOSIS — Z95820 Peripheral vascular angioplasty status with implants and grafts: Secondary | ICD-10-CM | POA: Diagnosis not present

## 2024-07-08 ENCOUNTER — Other Ambulatory Visit: Payer: Self-pay | Admitting: Student in an Organized Health Care Education/Training Program

## 2024-07-08 DIAGNOSIS — M792 Neuralgia and neuritis, unspecified: Secondary | ICD-10-CM

## 2024-07-08 DIAGNOSIS — E1140 Type 2 diabetes mellitus with diabetic neuropathy, unspecified: Secondary | ICD-10-CM

## 2024-07-08 DIAGNOSIS — G894 Chronic pain syndrome: Secondary | ICD-10-CM

## 2024-07-09 DIAGNOSIS — R490 Dysphonia: Secondary | ICD-10-CM | POA: Diagnosis not present

## 2024-07-09 DIAGNOSIS — J383 Other diseases of vocal cords: Secondary | ICD-10-CM | POA: Diagnosis not present

## 2024-07-16 ENCOUNTER — Ambulatory Visit: Admitting: Student in an Organized Health Care Education/Training Program

## 2024-07-16 ENCOUNTER — Ambulatory Visit (INDEPENDENT_AMBULATORY_CARE_PROVIDER_SITE_OTHER): Admitting: Vascular Surgery

## 2024-07-16 ENCOUNTER — Encounter (INDEPENDENT_AMBULATORY_CARE_PROVIDER_SITE_OTHER): Payer: Self-pay | Admitting: Vascular Surgery

## 2024-07-16 ENCOUNTER — Ambulatory Visit (INDEPENDENT_AMBULATORY_CARE_PROVIDER_SITE_OTHER)

## 2024-07-16 VITALS — BP 122/77 | HR 71 | Ht 67.0 in | Wt 203.0 lb

## 2024-07-16 DIAGNOSIS — E854 Organ-limited amyloidosis: Secondary | ICD-10-CM | POA: Diagnosis not present

## 2024-07-16 DIAGNOSIS — I10 Essential (primary) hypertension: Secondary | ICD-10-CM | POA: Diagnosis not present

## 2024-07-16 DIAGNOSIS — I6529 Occlusion and stenosis of unspecified carotid artery: Secondary | ICD-10-CM | POA: Insufficient documentation

## 2024-07-16 DIAGNOSIS — I739 Peripheral vascular disease, unspecified: Secondary | ICD-10-CM

## 2024-07-16 DIAGNOSIS — I890 Lymphedema, not elsewhere classified: Secondary | ICD-10-CM

## 2024-07-16 DIAGNOSIS — E785 Hyperlipidemia, unspecified: Secondary | ICD-10-CM

## 2024-07-16 DIAGNOSIS — I442 Atrioventricular block, complete: Secondary | ICD-10-CM | POA: Diagnosis not present

## 2024-07-16 DIAGNOSIS — I70222 Atherosclerosis of native arteries of extremities with rest pain, left leg: Secondary | ICD-10-CM | POA: Diagnosis not present

## 2024-07-16 DIAGNOSIS — I680 Cerebral amyloid angiopathy: Secondary | ICD-10-CM | POA: Diagnosis not present

## 2024-07-16 DIAGNOSIS — I6523 Occlusion and stenosis of bilateral carotid arteries: Secondary | ICD-10-CM

## 2024-07-16 DIAGNOSIS — E1140 Type 2 diabetes mellitus with diabetic neuropathy, unspecified: Secondary | ICD-10-CM

## 2024-07-16 NOTE — Assessment & Plan Note (Signed)
 Carotid duplex demonstrates velocities in the 1 to 39% range of stenosis bilaterally.  On Eliquis , Lipitor, and Plavix  for his PAD.  Recheck in 1 year.

## 2024-07-16 NOTE — Assessment & Plan Note (Signed)
 His ABIs today were 1.26 on the right and 1.31 on the left with triphasic waveforms and normal digital pressures and waveforms bilaterally.  He is doing well.  He had extensive vascular disease and we will continue to follow him on 41-month intervals.  Continue current medical regimen as well.

## 2024-07-16 NOTE — Progress Notes (Signed)
 MRN : [id number]  Damon English is a 73 y.o. (1951-02-14) male who presents with chief complaint of  Chief Complaint  Patient presents with   Follow-up  .  History of Present Illness: Patient returns today in follow up of his PAD as well as with a carotid assessment today.  He has previously undergone right leg bypass at [facility] some years ago.  I did percutaneous revascularization to the left lower extremity.  He does have some chronic swelling and lymphedema predominantly in the right leg although this has improved over time.  He has lost weight and done more exercise and been much more active and his legs are doing better.  He does have significant stasis dermatitis changes to both lower extremities and does still have neuropathic problems.  His ABIs today were 1.26 on the right and 1.31 on the left with triphasic waveforms and normal digital pressures and waveforms bilaterally.  Current Outpatient Medications  Medication Sig Dispense Refill   acetaminophen  (TYLENOL ) 325 MG tablet Take 2 tablets (650 mg total) by mouth every 6 (six) hours as needed for fever. 20 tablet 0   apixaban  (ELIQUIS ) 5 MG TABS tablet Take by mouth.     atorvastatin  (LIPITOR) 80 MG tablet Take 80 mg by mouth daily.     clonazePAM  (KLONOPIN ) 0.25 MG disintegrating tablet Take 1-2 tablets (0.25-0.5 mg total) by mouth 3 (three) times daily as needed (anxiety/agitation). 20 tablet 0   clonazePAM  (KLONOPIN ) 0.5 MG tablet Take 0.5 mg by mouth 2 (two) times daily as needed.     clopidogrel  (PLAVIX ) 75 MG tablet Take 1 tablet (75 mg total) by mouth daily. 30 tablet 0   diclofenac Sodium (VOLTAREN) 1 % GEL Apply 4 g topically 4 (four) times daily.     DULoxetine  (CYMBALTA ) 30 MG capsule Take 30 mg by mouth daily.     famotidine  (PEPCID ) 40 MG tablet Take 1 tablet by mouth every morning.     furosemide  (LASIX ) 20 MG tablet Take 40 mg by mouth daily as needed (Edema (Or weight gain of >2lbs overnight or 5lbs in one week)).      gabapentin  (NEURONTIN ) 100 MG capsule Take 1-2 capsules (100-200 mg total) by mouth 2 (two) times daily. Follow written titration schedule 120 capsule 5   glipiZIDE  (GLUCOTROL ) 10 MG tablet Take 5 mg by mouth as needed.     JARDIANCE 10 MG TABS tablet Take 10 mg by mouth daily.     latanoprost  (XALATAN ) 0.005 % ophthalmic solution Place 1 drop into both eyes at bedtime. 2.5 mL 12   losartan  (COZAAR ) 25 MG tablet Take 25 mg by mouth daily.     metoprolol  succinate (TOPROL -XL) 25 MG 24 hr tablet Take 25 mg by mouth at bedtime.     nitroGLYCERIN  (NITROSTAT ) 0.4 MG SL tablet Place 1 tablet (0.4 mg total) under the tongue every 5 (five) minutes as needed for chest pain. 20 tablet 12   OZEMPIC, 1 MG/DOSE, 4 MG/3ML SOPN Inject 1 mg into the skin once a week.     pantoprazole  (PROTONIX ) 40 MG tablet Take 40 mg by mouth daily.     polyethylene glycol (MIRALAX  / GLYCOLAX ) 17 g packet Take 17 g by mouth daily as needed for mild constipation or moderate constipation. 14 each 0   potassium chloride  (MICRO-K ) 10 MEQ CR capsule Take 20 mEq by mouth daily.     ranolazine  (RANEXA ) 500 MG 12 hr tablet Take 500 mg by mouth 2 (two)  times daily.     SENNOSIDES PO Take by mouth.     spironolactone  (ALDACTONE ) 25 MG tablet Take 25 mg by mouth daily.     thiamine  (VITAMIN B1) 100 MG tablet Take 100 mg by mouth daily.     torsemide (DEMADEX) 20 MG tablet Take 40 mg by mouth daily.     triamcinolone  (KENALOG ) 0.1 % paste Use as directed 1 Application in the mouth or throat 2 (two) times daily. Until healed 20 g 0   triamcinolone  cream (KENALOG ) 0.1 % APPLY TWICE DAILY TO RASH ON LEGS UP TO 2 WEEKS/MONTH AS NEEDED.     No current facility-administered medications for this visit.    Past Medical History:  Diagnosis Date   Alcohol use    cutting back but still heavy and daily, multiple shots of bourbon each night   Diabetes mellitus without complication (HCC)    History of tobacco use    reportedly quit in 2013    Hypertension    Pulmonary embolism (HCC) 2012   unprovoked   PVD (peripheral vascular disease)     Past Surgical History:  Procedure Laterality Date   BACK SURGERY     CHOLECYSTECTOMY     COLONOSCOPY N/A 03/06/2024   Procedure: COLONOSCOPY;  Surgeon: Toledo, Ladell POUR, MD;  Location: [facility] ENDOSCOPY;  Service: Gastroenterology;  Laterality: N/A;   CORONARY ANGIOGRAPHY N/A 01/30/2019   Procedure: CORONARY ANGIOGRAPHY;  Surgeon: Hester Wolm PARAS, MD;  Location: [facility] INVASIVE CV LAB;  Service: Cardiovascular;  Laterality: N/A;   ERCP W/ SPHICTEROTOMY  2015   ESOPHAGOGASTRODUODENOSCOPY N/A 03/06/2024   Procedure: EGD (ESOPHAGOGASTRODUODENOSCOPY);  Surgeon: Toledo, Ladell POUR, MD;  Location: [facility] ENDOSCOPY;  Service: Gastroenterology;  Laterality: N/A;   FEMORAL ARTERY - POPLITEAL ARTERY BYPASS GRAFT Right 06/19/2018   LEFT HEART CATH AND CORS/GRAFTS ANGIOGRAPHY N/A 06/25/2021   Procedure: LEFT HEART CATH AND CORS/GRAFTS ANGIOGRAPHY;  Surgeon: Lawyer Bernardino Cough, MD;  Location: [facility] INVASIVE CV LAB;  Service: Cardiovascular;  Laterality: N/A;   LOWER EXTREMITY ANGIOGRAPHY Left 01/14/2021   Procedure: LOWER EXTREMITY ANGIOGRAPHY;  Surgeon: Marea Selinda RAMAN, MD;  Location: [facility] INVASIVE CV LAB;  Service: Cardiovascular;  Laterality: Left;   LOWER EXTREMITY ANGIOGRAPHY Left 11/04/2021   Procedure: LOWER EXTREMITY ANGIOGRAPHY;  Surgeon: Marea Selinda RAMAN, MD;  Location: [facility] INVASIVE CV LAB;  Service: Cardiovascular;  Laterality: Left;   POLYPECTOMY  03/06/2024   Procedure: POLYPECTOMY, INTESTINE;  Surgeon: Aundria, Ladell POUR, MD;  Location: [facility] ENDOSCOPY;  Service: Gastroenterology;;   TEMPORARY PACEMAKER N/A 07/11/2021   Procedure: TEMPORARY PACEMAKER;  Surgeon: Darron Deatrice LABOR, MD;  Location: [facility] INVASIVE CV LAB;  Service: Cardiovascular;  Laterality: N/A;   THROMBOENDARTERECTOMY Right 06/19/2018   TONSILLECTOMY     VASCULAR SURGERY       Social History   Tobacco Use   Smoking status: Former     Current packs/day: 0.00    Average packs/day: 2.0 packs/day for 40.0 years (80.0 ttl pk-yrs)    Types: Cigarettes    Start date: 11/27/1970    Quit date: 11/27/2010    Years since quitting: 13.6   Smokeless tobacco: Never   Tobacco comments:    Quit in 2012  Vaping Use   Vaping status: Never Used  Substance Use Topics   Alcohol use: Yes    Alcohol/week: 7.0 standard drinks of alcohol    Types: 7 Shots of liquor per week    Comment: stopped alcohol 06/17/2021   Drug use: Never  Family History  Problem Relation Age of Onset   Hypertension Mother    Parkinson's disease Mother    Heart attack Father    Vascular Disease Father    AAA (abdominal aortic aneurysm) Father    Alzheimer's disease Father     Allergies  Allergen Reactions   Amoxicillin Other (See Comments)    Abdominal cramps Other reaction(s): Other (See Comments) Abdominal cramps Tolerated ancef  7/16   Hydromorphone  Hcl     Quick & severe nausea/vomiting  Other reaction(s): Vomiting Quick & severe nausea/vomiting    Aspirin  Other (See Comments)    Other reaction(s): Other (See Comments) 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  Is on NOAC for now.  No ASA with it. Not allergic per patient.  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  Is on NOAC for now.  No ASA with it. Not allergic per patient.   Per pt not an allergy    REVIEW OF SYSTEMS (Negative unless checked)   Constitutional: [] Weight loss  [] Fever  [] Chills Cardiac: [] Chest pain   [] Chest pressure   [x] Palpitations   [] Shortness of breath when laying flat   [] Shortness of breath at rest   [x] Shortness of breath with exertion. Vascular:  [x] Pain in legs with walking   [] Pain in legs at rest   [] Pain in legs when laying flat   [] Claudication   [] Pain in feet when walking  [] Pain in feet at rest  [] Pain in feet when laying flat   [] History of DVT   [] Phlebitis   [x] Swelling in legs   [] Varicose veins   [] Non-healing ulcers Pulmonary:   [] Uses home oxygen   [] Productive  cough   [] Hemoptysis   [] Wheeze  [] COPD   [] Asthma Neurologic:  [] Dizziness  [] Blackouts   [] Seizures   [] History of stroke   [] History of TIA  [] Aphasia   [] Temporary blindness   [] Dysphagia   [] Weakness or numbness in arms   [] Weakness or numbness in legs Musculoskeletal:  [x] Arthritis   [] Joint swelling   [x] Joint pain   [] Low back pain Hematologic:  [x] Easy bruising  [] Easy bleeding   [] Hypercoagulable state   [] Anemic   Gastrointestinal:  [] Blood in stool   [] Vomiting blood  [] Gastroesophageal reflux/heartburn   [] Abdominal pain Genitourinary:  [] Chronic kidney disease   [] Difficult urination  [] Frequent urination  [] Burning with urination   [] Hematuria Skin:  [] Rashes   [] Ulcers   [] Wounds Psychological:  [] History of anxiety   []  History of major depression.  Physical Examination  BP 122/77   Pulse 71   Ht 5' 7 (1.702 m)   Wt 203 lb (92.1 kg)   BMI 31.79 kg/m  Gen:  WD/WN, NAD Head: [place]/AT, No temporalis wasting. Ear/Nose/Throat: Hearing grossly intact, nares w/o erythema or drainage Eyes: Conjunctiva clear. Sclera non-icteric Neck: Supple.  Trachea midline Pulmonary:  Good air movement, no use of accessory muscles.  Cardiac: RRR, no JVD Vascular:  Vessel Right Left  Radial Palpable Palpable                          PT 1+ Palpable 1+ Palpable  DP 1+ Palpable 2+ Palpable   Gastrointestinal: soft, non-tender/non-distended. No guarding/reflex.  Musculoskeletal: M/S 5/5 throughout.  No deformity or atrophy. Mild BLE edema, slightly worse on the right than the left. Neurologic: Sensation grossly intact in extremities.  Symmetrical.  Speech is fluent.  Psychiatric: Judgment intact, Mood & affect appropriate for pt's clinical situation. Dermatologic: No rashes or ulcers noted.  No cellulitis or open wounds.      Labs No results found for this or any previous visit (from the past 2160 hours).  Radiology No results found.  Assessment/Plan  Peripheral arterial  disease His ABIs today were 1.26 on the right and 1.31 on the left with triphasic waveforms and normal digital pressures and waveforms bilaterally.  He is doing well.  He had extensive vascular disease and we will continue to follow him on 28-month intervals.  Continue current medical regimen as well.  Carotid stenosis Carotid duplex demonstrates velocities in the 1 to 39% range of stenosis bilaterally.  On Eliquis , Lipitor, and Plavix  for his PAD.  Recheck in 1 year.  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 Continue statin therapy     Type 2 diabetes mellitus (HCC) blood glucose control important in reducing the progression of atherosclerotic disease. Also, involved in wound healing. On appropriate medications.     HTN (hypertension) blood pressure control important in reducing the progression of atherosclerotic disease. On appropriate oral medications.   Lymphedema Right leg.  After surgical bypass at [facility] several years ago.  Now has swelling on both sides.  Would promote the use of compression socks more regularly. Overall this is better with weight loss, compression socks, and exercise  Selinda Gu, MD  07/16/2024 4:24 PM    This note was created with Dragon medical transcription system.  Any errors from dictation are purely unintentional

## 2024-07-17 DIAGNOSIS — I4901 Ventricular fibrillation: Secondary | ICD-10-CM | POA: Diagnosis not present

## 2024-07-17 DIAGNOSIS — Z7901 Long term (current) use of anticoagulants: Secondary | ICD-10-CM | POA: Diagnosis not present

## 2024-07-17 DIAGNOSIS — I10 Essential (primary) hypertension: Secondary | ICD-10-CM | POA: Diagnosis not present

## 2024-07-17 DIAGNOSIS — I5022 Chronic systolic (congestive) heart failure: Secondary | ICD-10-CM | POA: Diagnosis not present

## 2024-07-19 LAB — VAS US ABI WITH/WO TBI
Left ABI: 1.26
Right ABI: 1.26

## 2024-07-24 DIAGNOSIS — I5022 Chronic systolic (congestive) heart failure: Secondary | ICD-10-CM | POA: Diagnosis not present

## 2024-07-24 DIAGNOSIS — Z8674 Personal history of sudden cardiac arrest: Secondary | ICD-10-CM | POA: Diagnosis not present

## 2024-07-24 DIAGNOSIS — Z950 Presence of cardiac pacemaker: Secondary | ICD-10-CM | POA: Diagnosis not present

## 2024-07-24 DIAGNOSIS — I442 Atrioventricular block, complete: Secondary | ICD-10-CM | POA: Diagnosis not present

## 2024-07-24 DIAGNOSIS — I4901 Ventricular fibrillation: Secondary | ICD-10-CM | POA: Diagnosis not present

## 2024-08-07 DIAGNOSIS — R490 Dysphonia: Secondary | ICD-10-CM | POA: Diagnosis not present

## 2024-08-07 DIAGNOSIS — J383 Other diseases of vocal cords: Secondary | ICD-10-CM | POA: Diagnosis not present

## 2024-08-14 DIAGNOSIS — Z8674 Personal history of sudden cardiac arrest: Secondary | ICD-10-CM | POA: Diagnosis not present

## 2024-08-14 DIAGNOSIS — I5022 Chronic systolic (congestive) heart failure: Secondary | ICD-10-CM | POA: Diagnosis not present

## 2024-08-22 DIAGNOSIS — J383 Other diseases of vocal cords: Secondary | ICD-10-CM | POA: Diagnosis not present

## 2024-08-30 DIAGNOSIS — J019 Acute sinusitis, unspecified: Secondary | ICD-10-CM | POA: Diagnosis not present

## 2024-08-30 DIAGNOSIS — R6889 Other general symptoms and signs: Secondary | ICD-10-CM | POA: Diagnosis not present

## 2024-09-05 DIAGNOSIS — R490 Dysphonia: Secondary | ICD-10-CM | POA: Diagnosis not present

## 2024-09-05 DIAGNOSIS — J383 Other diseases of vocal cords: Secondary | ICD-10-CM | POA: Diagnosis not present

## 2024-09-17 DIAGNOSIS — I951 Orthostatic hypotension: Secondary | ICD-10-CM | POA: Diagnosis not present

## 2024-09-17 DIAGNOSIS — E782 Mixed hyperlipidemia: Secondary | ICD-10-CM | POA: Diagnosis not present

## 2024-09-17 DIAGNOSIS — Z955 Presence of coronary angioplasty implant and graft: Secondary | ICD-10-CM | POA: Diagnosis not present

## 2024-09-17 DIAGNOSIS — R0609 Other forms of dyspnea: Secondary | ICD-10-CM | POA: Diagnosis not present

## 2024-09-17 DIAGNOSIS — I5020 Unspecified systolic (congestive) heart failure: Secondary | ICD-10-CM | POA: Diagnosis not present

## 2024-09-17 DIAGNOSIS — I5022 Chronic systolic (congestive) heart failure: Secondary | ICD-10-CM | POA: Diagnosis not present

## 2024-09-18 DIAGNOSIS — I2510 Atherosclerotic heart disease of native coronary artery without angina pectoris: Secondary | ICD-10-CM | POA: Diagnosis not present

## 2024-09-18 DIAGNOSIS — I739 Peripheral vascular disease, unspecified: Secondary | ICD-10-CM | POA: Diagnosis not present

## 2024-09-18 DIAGNOSIS — E119 Type 2 diabetes mellitus without complications: Secondary | ICD-10-CM | POA: Diagnosis not present

## 2024-09-18 DIAGNOSIS — N1832 Chronic kidney disease, stage 3b: Secondary | ICD-10-CM | POA: Diagnosis not present

## 2024-09-18 DIAGNOSIS — Z79899 Other long term (current) drug therapy: Secondary | ICD-10-CM | POA: Diagnosis not present

## 2024-09-18 DIAGNOSIS — Z23 Encounter for immunization: Secondary | ICD-10-CM | POA: Diagnosis not present

## 2024-10-02 DIAGNOSIS — H903 Sensorineural hearing loss, bilateral: Secondary | ICD-10-CM | POA: Diagnosis not present

## 2024-10-03 DIAGNOSIS — J383 Other diseases of vocal cords: Secondary | ICD-10-CM | POA: Diagnosis not present

## 2024-10-03 DIAGNOSIS — R490 Dysphonia: Secondary | ICD-10-CM | POA: Diagnosis not present

## 2024-10-16 DIAGNOSIS — I4901 Ventricular fibrillation: Secondary | ICD-10-CM | POA: Diagnosis not present

## 2024-10-16 DIAGNOSIS — Z950 Presence of cardiac pacemaker: Secondary | ICD-10-CM | POA: Diagnosis not present

## 2024-10-16 DIAGNOSIS — Z7901 Long term (current) use of anticoagulants: Secondary | ICD-10-CM | POA: Diagnosis not present

## 2024-10-16 DIAGNOSIS — I5022 Chronic systolic (congestive) heart failure: Secondary | ICD-10-CM | POA: Diagnosis not present

## 2024-11-05 ENCOUNTER — Telehealth (INDEPENDENT_AMBULATORY_CARE_PROVIDER_SITE_OTHER): Payer: Self-pay

## 2024-11-05 NOTE — Telephone Encounter (Signed)
 We can move up appt to evaluate

## 2024-11-05 NOTE — Telephone Encounter (Signed)
 Patient called into nurse line to report foot pain leg swelling and difficulty walking. Call to patient he reports the pain level has improved and he is walking much better after applying pressure stockings. He has been advised to go to the ED if the pain worsens or returns. Patient reports he would still like to be seen at some point in the near future as well. Please advise

## 2024-11-29 ENCOUNTER — Ambulatory Visit (INDEPENDENT_AMBULATORY_CARE_PROVIDER_SITE_OTHER): Admitting: Vascular Surgery

## 2024-11-29 ENCOUNTER — Encounter (INDEPENDENT_AMBULATORY_CARE_PROVIDER_SITE_OTHER)

## 2024-12-03 ENCOUNTER — Ambulatory Visit (INDEPENDENT_AMBULATORY_CARE_PROVIDER_SITE_OTHER): Admitting: Vascular Surgery

## 2024-12-03 ENCOUNTER — Encounter (INDEPENDENT_AMBULATORY_CARE_PROVIDER_SITE_OTHER)

## 2025-01-14 ENCOUNTER — Ambulatory Visit (INDEPENDENT_AMBULATORY_CARE_PROVIDER_SITE_OTHER): Admitting: Vascular Surgery

## 2025-01-14 ENCOUNTER — Encounter (INDEPENDENT_AMBULATORY_CARE_PROVIDER_SITE_OTHER)

## 2025-07-22 ENCOUNTER — Ambulatory Visit (INDEPENDENT_AMBULATORY_CARE_PROVIDER_SITE_OTHER): Admitting: Vascular Surgery

## 2025-07-22 ENCOUNTER — Encounter (INDEPENDENT_AMBULATORY_CARE_PROVIDER_SITE_OTHER)
